# Patient Record
Sex: Female | Born: 1950 | Race: White | Hispanic: No | Marital: Single | Smoking: Former smoker
Health system: Southern US, Community
[De-identification: ages and names within clinical notes are randomized; demographics above are authoritative.]

## PROBLEM LIST (undated history)

## (undated) DIAGNOSIS — D126 Benign neoplasm of colon, unspecified: Secondary | ICD-10-CM

## (undated) DIAGNOSIS — T7840XA Allergy, unspecified, initial encounter: Secondary | ICD-10-CM

## (undated) DIAGNOSIS — M8580 Other specified disorders of bone density and structure, unspecified site: Secondary | ICD-10-CM

## (undated) DIAGNOSIS — C3490 Malignant neoplasm of unspecified part of unspecified bronchus or lung: Secondary | ICD-10-CM

## (undated) DIAGNOSIS — M549 Dorsalgia, unspecified: Secondary | ICD-10-CM

## (undated) HISTORY — DX: Allergy, unspecified, initial encounter: T78.40XA

## (undated) HISTORY — DX: Other specified disorders of bone density and structure, unspecified site: M85.80

## (undated) HISTORY — PX: COLONOSCOPY: SHX174

---

## 2007-06-17 ENCOUNTER — Other Ambulatory Visit: Admission: RE | Admit: 2007-06-17 | Discharge: 2007-06-17 | Payer: Self-pay | Admitting: Obstetrics and Gynecology

## 2008-02-05 ENCOUNTER — Ambulatory Visit (HOSPITAL_COMMUNITY): Admission: RE | Admit: 2008-02-05 | Discharge: 2008-02-05 | Payer: Self-pay | Admitting: Family Medicine

## 2008-02-19 ENCOUNTER — Ambulatory Visit (HOSPITAL_COMMUNITY): Admission: RE | Admit: 2008-02-19 | Discharge: 2008-02-19 | Payer: Self-pay | Admitting: General Surgery

## 2008-02-19 ENCOUNTER — Encounter (INDEPENDENT_AMBULATORY_CARE_PROVIDER_SITE_OTHER): Payer: Self-pay | Admitting: General Surgery

## 2011-01-08 NOTE — H&P (Signed)
[name], Haley [name]               ACCOUNT NO.:  [id number]   MEDICAL RECORD NO.:  [id number]          PATIENT TYPE:  AMB   LOCATION:  DAY                           FACILITY:  [facility]   PHYSICIAN:  Dalia Heading, M.D.  DATE OF BIRTH:  02/07/1951   DATE OF ADMISSION:  02/19/2008  DATE OF DISCHARGE:  06/26/2009LH                              HISTORY & PHYSICAL   CHIEF COMPLAINT:  Need for screening colonoscopy.   HISTORY OF PRESENT ILLNESS:  The patient is a 60 year old white female  who is referred for endoscopic evaluation.  She needs a colonoscopy for  screening purposes.  No abdominal pain, weight loss, nausea, vomiting,  diarrhea, constipation, melena, or hematochezia have been noted.  She  has never had a colonoscopy.  There is no family history of colon  carcinoma.   PAST MEDICAL HISTORY:  Unremarkable.   PAST SURGICAL HISTORY:  Unremarkable.   CURRENT MEDICATIONS:  None.   ALLERGIES:  No known drug allergies.   REVIEW OF SYSTEMS:  Noncontributory.   PHYSICAL EXAMINATION:  GENERAL:  The patient is a well-developed, well-  nourished white female, in no acute distress.  LUNGS:  Clear to auscultation with equal breath sounds bilaterally.  HEART:  Regular rate and rhythm without S3, S4, or murmurs.  ABDOMEN:  Soft, nontender, and nondistended.  No hepatosplenomegaly or  masses are noted.  RECTAL:  Deferred to the procedure.   IMPRESSION:  Need for screening colonoscopy.   PLAN:  The patient is scheduled for a colonoscopy on February 19, 2008.  The  risks and benefits of the procedure including bleeding and perforation  were fully explained to the patient, gave informed consent.      Dalia Heading, M.D.  Electronically Signed     MAJ/MEDQ  D:  02/02/2008  T:  02/03/2008  Job:  [id number]   cc:   Patrica Duel, M.D.  Fax: [phone]

## 2012-11-19 ENCOUNTER — Other Ambulatory Visit: Payer: Self-pay | Admitting: Family Medicine

## 2012-11-19 DIAGNOSIS — Z1231 Encounter for screening mammogram for malignant neoplasm of breast: Secondary | ICD-10-CM

## 2012-12-01 ENCOUNTER — Ambulatory Visit (HOSPITAL_COMMUNITY)
Admission: RE | Admit: 2012-12-01 | Discharge: 2012-12-01 | Disposition: A | Discharge status: Home / Self Care | Payer: BC Managed Care – PPO | Source: Ambulatory Visit | Attending: Family Medicine | Admitting: Family Medicine

## 2012-12-01 DIAGNOSIS — Z1231 Encounter for screening mammogram for malignant neoplasm of breast: Secondary | ICD-10-CM | POA: Insufficient documentation

## 2014-01-14 ENCOUNTER — Other Ambulatory Visit (HOSPITAL_COMMUNITY): Payer: Self-pay | Admitting: Nurse Practitioner

## 2014-01-14 DIAGNOSIS — Z1231 Encounter for screening mammogram for malignant neoplasm of breast: Secondary | ICD-10-CM

## 2014-01-24 ENCOUNTER — Ambulatory Visit (HOSPITAL_COMMUNITY)
Admission: RE | Admit: 2014-01-24 | Discharge: 2014-01-24 | Disposition: A | Discharge status: Home / Self Care | Payer: Managed Care, Other (non HMO) | Source: Ambulatory Visit | Attending: Nurse Practitioner | Admitting: Nurse Practitioner

## 2014-01-24 DIAGNOSIS — Z1231 Encounter for screening mammogram for malignant neoplasm of breast: Secondary | ICD-10-CM | POA: Insufficient documentation

## 2014-05-09 ENCOUNTER — Other Ambulatory Visit: Payer: Self-pay | Admitting: Gastroenterology

## 2014-07-05 ENCOUNTER — Other Ambulatory Visit: Payer: Self-pay | Admitting: Gastroenterology

## 2014-07-08 ENCOUNTER — Encounter (HOSPITAL_COMMUNITY): Payer: Self-pay | Admitting: *Deleted

## 2014-10-14 ENCOUNTER — Other Ambulatory Visit: Payer: Self-pay | Admitting: Gastroenterology

## 2014-10-20 ENCOUNTER — Encounter (HOSPITAL_COMMUNITY): Payer: Self-pay | Admitting: *Deleted

## 2014-10-25 ENCOUNTER — Ambulatory Visit (HOSPITAL_COMMUNITY): Payer: Managed Care, Other (non HMO) | Admitting: Anesthesiology

## 2014-10-25 ENCOUNTER — Encounter (HOSPITAL_COMMUNITY)
Admission: RE | Disposition: A | Discharge status: Home / Self Care | Payer: Self-pay | Source: Ambulatory Visit | Attending: Gastroenterology

## 2014-10-25 ENCOUNTER — Encounter (HOSPITAL_COMMUNITY): Payer: Self-pay

## 2014-10-25 ENCOUNTER — Ambulatory Visit (HOSPITAL_COMMUNITY)
Admission: RE | Admit: 2014-10-25 | Discharge: 2014-10-25 | Disposition: A | Discharge status: Home / Self Care | Payer: Managed Care, Other (non HMO) | Source: Ambulatory Visit | Attending: Gastroenterology | Admitting: Gastroenterology

## 2014-10-25 DIAGNOSIS — F17210 Nicotine dependence, cigarettes, uncomplicated: Secondary | ICD-10-CM | POA: Insufficient documentation

## 2014-10-25 DIAGNOSIS — D122 Benign neoplasm of ascending colon: Secondary | ICD-10-CM | POA: Diagnosis not present

## 2014-10-25 DIAGNOSIS — Z09 Encounter for follow-up examination after completed treatment for conditions other than malignant neoplasm: Secondary | ICD-10-CM | POA: Diagnosis present

## 2014-10-25 DIAGNOSIS — D123 Benign neoplasm of transverse colon: Secondary | ICD-10-CM | POA: Insufficient documentation

## 2014-10-25 HISTORY — PX: COLONOSCOPY WITH PROPOFOL: SHX5780

## 2014-10-25 SURGERY — COLONOSCOPY WITH PROPOFOL
Anesthesia: Monitor Anesthesia Care

## 2014-10-25 MED ORDER — SODIUM CHLORIDE 0.9 % IV SOLN: INTRAVENOUS | Status: DC

## 2014-10-25 MED ORDER — LACTATED RINGERS IV SOLN
INTRAVENOUS | Status: DC
  Administered 2014-10-25: 1000 mL via INTRAVENOUS

## 2014-10-25 MED ORDER — PROPOFOL 10 MG/ML IV BOLUS
INTRAVENOUS | Status: AC
  Filled 2014-10-25: qty 20

## 2014-10-25 MED ORDER — PROPOFOL INFUSION 10 MG/ML OPTIME
INTRAVENOUS | Status: DC | PRN
  Administered 2014-10-25: 200 ug/kg/min via INTRAVENOUS

## 2014-10-25 SURGICAL SUPPLY — 21 items

## 2014-10-25 NOTE — Anesthesia Postprocedure Evaluation (Signed)
  Anesthesia Post-op Note  Patient: Haley [name]  Procedure(s) Performed: Procedure(s): COLONOSCOPY WITH PROPOFOL (N/A)  Patient Location: PACU  Anesthesia Type:MAC  Level of Consciousness: awake, alert , oriented and patient cooperative  Airway and Oxygen Therapy: Patient Spontanous Breathing  Post-op Pain: none  Post-op Assessment: Post-op Vital signs reviewed, Patient's Cardiovascular Status Stable, Respiratory Function Stable, Patent Airway, No signs of Nausea or vomiting and Pain level controlled  Post-op Vital Signs: stable  Last Vitals:  Filed Vitals:   10/25/14 0850  BP: 148/90  Temp:   Resp: 22    Complications: No apparent anesthesia complications

## 2014-10-25 NOTE — Op Note (Signed)
Procedure: Surveillance colonoscopy. Colonoscopy with removal of a 7 mm adenomatous sigmoid colon polyp performed on 02/19/2008  Endoscopist: Earle Gell  Premedication: Propofol administered by anesthesia  Procedure: The patient was placed in the left lateral decubitus position. Anal inspection and digital rectal exam were normal. The Pentax pediatric colonoscope was introduced into the rectum and advanced to the cecum. A normal-appearing appendiceal orifice was identified. A normal-appearing ileocecal valve was identified. Colonic preparation for the exam today was satisfactory. Withdrawal time was 18 minutes  Rectum. Normal. Retroflexed view of the distal rectum normal  Sigmoid colon and descending colon. Normal  Splenic flexure. Normal  Transverse colon. From the mid-distal transverse colon, three 5 mm sessile polyps were removed with the cold snare  Hepatic flexure. Normal  Ascending colon. From the mid ascending colon, a 5 mm sessile polyp was removed with the cold snare  Cecum and ileocecal valve. Normal  Assessment: Three 5 mm sessile polyps were removed from the transverse colon and a 5 mm sessile polyp was removed from the ascending colon.  Recommendation: Schedule surveillance colonoscopy in 5 years

## 2014-10-25 NOTE — Transfer of Care (Signed)
Immediate Anesthesia Transfer of Care Note  Patient: Haley [name]  Procedure(s) Performed: Procedure(s): COLONOSCOPY WITH PROPOFOL (N/A)  Patient Location: PACU  Anesthesia Type:MAC  Level of Consciousness: sedated  Airway & Oxygen Therapy: Patient Spontanous Breathing and Patient connected to nasal cannula oxygen  Post-op Assessment: Report given to RN and Post -op Vital signs reviewed and stable  Post vital signs: Reviewed and stable  Last Vitals:  Filed Vitals:   10/25/14 0721  BP: 136/80  Temp: 36.6 C  Resp: 25    Complications: No apparent anesthesia complications

## 2014-10-25 NOTE — Anesthesia Preprocedure Evaluation (Signed)
Anesthesia Evaluation  Patient identified by MRN, date of birth, ID band Patient awake    Reviewed: Allergy & Precautions, NPO status , Patient's Chart, lab work & pertinent test results  Airway        Dental   Pulmonary Current Smoker,          Cardiovascular     Neuro/Psych    GI/Hepatic   Endo/Other    Renal/GU      Musculoskeletal   Abdominal   Peds  Hematology   Anesthesia Other Findings   Reproductive/Obstetrics                             Anesthesia Physical Anesthesia Plan  ASA: II  Anesthesia Plan: MAC   Post-op Pain Management:    Induction: Intravenous  Airway Management Planned: Simple Face Mask  Additional Equipment:   Intra-op Plan:   Post-operative Plan:   Informed Consent: I have reviewed the patients History and Physical, chart, labs and discussed the procedure including the risks, benefits and alternatives for the proposed anesthesia with the patient or authorized representative who has indicated his/her understanding and acceptance.     Plan Discussed with: CRNA, Anesthesiologist and Surgeon  Anesthesia Plan Comments:         Anesthesia Quick Evaluation

## 2014-10-25 NOTE — H&P (Signed)
  Procedure: Surveillance colonoscopy. Screening colonoscopy with removal of a 7 mm adenomatous sigmoid colon polyp performed on 02/22/2008  History: The patient is a 64 year old female born 07-10-1951. She is scheduled to undergo a surveillance colonoscopy today.  Medication allergies: None  Past medical and surgical history: No chronic medical problems or previous surgery.  Habits: The patient smokes one pack of cigarettes per day  Family history: Negative for colon cancer  Exam: The patient is alert and lying comfortably on the endoscopy stretcher. Abdomen is soft and nontender to palpation. Lungs are clear to auscultation. Cardiac exam reveals a regular rhythm.  Plan: Proceed with surveillance colonoscopy

## 2014-10-26 ENCOUNTER — Encounter (HOSPITAL_COMMUNITY): Payer: Self-pay | Admitting: Gastroenterology

## 2014-10-26 DIAGNOSIS — D126 Benign neoplasm of colon, unspecified: Secondary | ICD-10-CM

## 2014-10-26 HISTORY — DX: Benign neoplasm of colon, unspecified: D12.6

## 2015-02-15 ENCOUNTER — Other Ambulatory Visit (HOSPITAL_COMMUNITY): Payer: Self-pay | Admitting: Nurse Practitioner

## 2015-02-15 DIAGNOSIS — Z1231 Encounter for screening mammogram for malignant neoplasm of breast: Secondary | ICD-10-CM

## 2015-02-22 ENCOUNTER — Ambulatory Visit (HOSPITAL_COMMUNITY)
Admission: RE | Admit: 2015-02-22 | Discharge: 2015-02-22 | Disposition: A | Discharge status: Home / Self Care | Payer: Managed Care, Other (non HMO) | Source: Ambulatory Visit | Attending: Nurse Practitioner | Admitting: Nurse Practitioner

## 2015-02-22 DIAGNOSIS — Z1231 Encounter for screening mammogram for malignant neoplasm of breast: Secondary | ICD-10-CM | POA: Diagnosis not present

## 2015-05-18 ENCOUNTER — Encounter: Payer: Self-pay | Admitting: *Deleted

## 2015-07-03 ENCOUNTER — Ambulatory Visit (INDEPENDENT_AMBULATORY_CARE_PROVIDER_SITE_OTHER): Payer: Managed Care, Other (non HMO) | Admitting: Family Medicine

## 2015-07-03 ENCOUNTER — Encounter: Payer: Self-pay | Admitting: Family Medicine

## 2015-07-03 VITALS — BP 128/60 | HR 82 | Temp 97.3°F | Resp 16 | Ht 63.0 in | Wt 127.0 lb

## 2015-07-03 DIAGNOSIS — H6983 Other specified disorders of Eustachian tube, bilateral: Secondary | ICD-10-CM

## 2015-07-03 DIAGNOSIS — Z23 Encounter for immunization: Secondary | ICD-10-CM

## 2015-07-03 DIAGNOSIS — H6980 Other specified disorders of Eustachian tube, unspecified ear: Secondary | ICD-10-CM | POA: Insufficient documentation

## 2015-07-03 DIAGNOSIS — H9193 Unspecified hearing loss, bilateral: Secondary | ICD-10-CM

## 2015-07-03 DIAGNOSIS — E781 Pure hyperglyceridemia: Secondary | ICD-10-CM | POA: Diagnosis not present

## 2015-07-03 DIAGNOSIS — J309 Allergic rhinitis, unspecified: Secondary | ICD-10-CM | POA: Diagnosis not present

## 2015-07-03 DIAGNOSIS — E785 Hyperlipidemia, unspecified: Secondary | ICD-10-CM | POA: Insufficient documentation

## 2015-07-03 DIAGNOSIS — F17200 Nicotine dependence, unspecified, uncomplicated: Secondary | ICD-10-CM

## 2015-07-03 LAB — COMPREHENSIVE METABOLIC PANEL
ALBUMIN: 4.2 g/dL (ref 3.6–5.1)
ALT: 14 U/L (ref 6–29)
AST: 22 U/L (ref 10–35)
Alkaline Phosphatase: 70 U/L (ref 33–130)
BUN: 16 mg/dL (ref 7–25)
CHLORIDE: 101 mmol/L (ref 98–110)
CO2: 25 mmol/L (ref 20–31)
Calcium: 9.7 mg/dL (ref 8.6–10.4)
Creat: 0.75 mg/dL (ref 0.50–0.99)
Glucose, Bld: 87 mg/dL (ref 70–99)
POTASSIUM: 4.4 mmol/L (ref 3.5–5.3)
Sodium: 140 mmol/L (ref 135–146)
Total Bilirubin: 0.4 mg/dL (ref 0.2–1.2)
Total Protein: 7.1 g/dL (ref 6.1–8.1)

## 2015-07-03 LAB — LIPID PANEL
CHOLESTEROL: 259 mg/dL — AB (ref 125–200)
HDL: 66 mg/dL (ref >=46)
LDL Cholesterol: 173 mg/dL — ABNORMAL HIGH (ref <130)
TRIGLYCERIDES: 102 mg/dL (ref <150)
Total CHOL/HDL Ratio: 3.9 Ratio (ref <=5.0)
VLDL: 20 mg/dL (ref <30)

## 2015-07-03 LAB — HEPATITIS C ANTIBODY: HCV Ab: NEGATIVE

## 2015-07-03 MED ORDER — CETIRIZINE HCL 10 MG PO TABS: 10.0000 mg | ORAL_TABLET | Freq: Every day | ORAL | Status: DC

## 2015-07-03 MED ORDER — FLUTICASONE PROPIONATE 50 MCG/ACT NA SUSP: 2.0000 | Freq: Every day | NASAL | Status: DC

## 2015-07-03 NOTE — Assessment & Plan Note (Signed)
counsled on cessation, obtain CT of chest TDAP, Flu given, declines pneumonia vaccine, she will check into Shingles vaccine

## 2015-07-03 NOTE — Patient Instructions (Addendum)
Release of records- [facility]  Release of records- [facility]- need CT scan, Bone Density We will call with lab results  Flonase twice a day  Zyrtec '10mg'$  once a day  ENT referral  TDAP  We will call with lab results  F/U as needed or Physical end of July for Physical

## 2015-07-03 NOTE — Assessment & Plan Note (Signed)
I think some of hearing issues due to untreated allergies but does not explain all of it, refer to ENT for hearing exam

## 2015-07-03 NOTE — Assessment & Plan Note (Signed)
Restart flonase, add zyrtec

## 2015-07-03 NOTE — Assessment & Plan Note (Signed)
Recheck triglycerdies, discussed dietary changes,may need to add fish oil

## 2015-07-03 NOTE — Progress Notes (Signed)
Patient ID: Haley [name], female   DOB: 22-Jan-1951, 64 y.o.   MRN: [id number]   Subjective:    Patient ID: Haley [name], female    DOB: May 06, 1951, 64 y.o.   MRN: [id number]  Patient presents for [facility] and ENT Issues  For past 6 months or more she has had ear pressure,sore throat, problems with hearing. She used flonase at recommendation of previous PCP- [facility] this helped a few weeks, but hearing loss seems more than allergies. Denies reflux problems. She denies any stuffiness of ears, she has more ringing in ears an their are tones she can not hear.  CPE last done in July 2016, at that time told she has High TG. She had mammo which was normal, bone density as well. PAP due in 2017.  She is a smoker, has no interest in quitting at this time. She had CT of chest for smoking in 2015 does not know results of this. She does wheeze and cough at times.  She works at [employer], she does a lot of lifting. She injured right shoulder working out a few months ago, saw Dr. Tonita Cong at [place] had steroid injection. Still has pain but does not want to pursue any further      Review Of Systems:  GEN- denies fatigue, fever, weight loss,weakness, recent illness HEENT- denies eye drainage, change in vision,+ nasal discharge, CVS- denies chest pain, palpitations RESP- denies SOB, cough, wheeze ABD- denies N/V, change in stools, abd pain GU- denies dysuria, hematuria, dribbling, incontinence MSK- + joint pain, muscle aches, injury Neuro- denies headache, dizziness, syncope, seizure activity       Objective:    BP 128/60 mmHg  Pulse 82  Temp(Src) 97.3 F (36.3 C) (Oral)  Resp 16  Ht '5\' 3"'$  (1.6 m)  Wt 127 lb (57.607 kg)  BMI 22.50 kg/m2 GEN- NAD, alert and oriented x3 HEENT- PERRL, EOMI, non injected sclera, pink conjunctiva, MMM, oropharynx clear, TM clear bilat, nares clear rhinorrhea Neck- Supple, no thyromegaly CVS- RRR, no  murmur RESP-CTAB ABD-NABS,soft,NT,ND Psych- normal affect and mood EXT- No edema Pulses- Radial, DP- 2+        Assessment & Plan:      Problem List Items Addressed This Visit    Tobacco use disorder    counsled on cessation, obtain CT of chest TDAP, Flu given, declines pneumonia vaccine, she will check into Shingles vaccine      Hypertriglyceridemia    Recheck triglycerdies, discussed dietary changes,may need to add fish oil      Relevant Orders   Comprehensive metabolic panel   Lipid panel   Hepatitis C antibody, reflex   Eustachian tube dysfunction - Primary    I think some of hearing issues due to untreated allergies but does not explain all of it, refer to ENT for hearing exam      Allergic rhinitis    Restart flonase, add zyrtec       Other Visit Diagnoses    Need for prophylactic vaccination and inoculation against influenza        Relevant Orders    Flu Vaccine QUAD 36+ mos PF IM (Fluarix & Fluzone Quad PF) (Completed)    Need for prophylactic vaccination with combined diphtheria-tetanus-pertussis (DTP) vaccine        Relevant Orders    Tdap vaccine greater than or equal to 7yo IM (Completed)       Note: This dictation was prepared with Dragon dictation  along with smaller phrase technology. Any transcriptional errors that result from this process are unintentional.

## 2015-07-06 ENCOUNTER — Other Ambulatory Visit: Payer: Self-pay | Admitting: *Deleted

## 2015-07-06 MED ORDER — SIMVASTATIN 10 MG PO TABS: 10.0000 mg | ORAL_TABLET | Freq: Every day | ORAL | Status: DC

## 2016-02-12 ENCOUNTER — Other Ambulatory Visit: Payer: Self-pay | Admitting: Family Medicine

## 2016-02-12 DIAGNOSIS — Z1231 Encounter for screening mammogram for malignant neoplasm of breast: Secondary | ICD-10-CM

## 2016-03-04 ENCOUNTER — Ambulatory Visit (HOSPITAL_COMMUNITY)
Admission: RE | Admit: 2016-03-04 | Discharge: 2016-03-04 | Disposition: A | Discharge status: Home / Self Care | Payer: Managed Care, Other (non HMO) | Source: Ambulatory Visit | Attending: Family Medicine | Admitting: Family Medicine

## 2016-03-04 DIAGNOSIS — Z1231 Encounter for screening mammogram for malignant neoplasm of breast: Secondary | ICD-10-CM | POA: Diagnosis present

## 2016-03-04 LAB — HM DEXA SCAN

## 2016-03-25 ENCOUNTER — Encounter: Payer: Managed Care, Other (non HMO) | Admitting: Family Medicine

## 2016-03-28 ENCOUNTER — Encounter: Payer: Self-pay | Admitting: Family Medicine

## 2016-04-01 ENCOUNTER — Encounter: Payer: Self-pay | Admitting: Family Medicine

## 2016-04-02 ENCOUNTER — Telehealth: Payer: Self-pay | Admitting: *Deleted

## 2016-04-02 NOTE — Telephone Encounter (Signed)
Received Dexa results.   MD reviewed results and recommendations are as follows: Osteopenia noted Calcium '1200mg'$  PO QD Vitamin D 1000 IU PO QD Weight Bearing Exercises Repeat Dexa in (2) years  Call placed to patient and patient made aware.

## 2016-05-03 ENCOUNTER — Other Ambulatory Visit: Payer: Managed Care, Other (non HMO)

## 2016-05-03 DIAGNOSIS — Z79899 Other long term (current) drug therapy: Secondary | ICD-10-CM

## 2016-05-03 DIAGNOSIS — Z0000 Encounter for general adult medical examination without abnormal findings: Secondary | ICD-10-CM

## 2016-05-03 DIAGNOSIS — F17200 Nicotine dependence, unspecified, uncomplicated: Secondary | ICD-10-CM

## 2016-05-03 DIAGNOSIS — E781 Pure hyperglyceridemia: Secondary | ICD-10-CM

## 2016-05-03 LAB — CBC WITH DIFFERENTIAL/PLATELET
BASOS PCT: 1 %
Basophils Absolute: 55 cells/uL (ref 0–200)
EOS ABS: 110 {cells}/uL (ref 15–500)
Eosinophils Relative: 2 %
HEMATOCRIT: 42.1 % (ref 35.0–45.0)
HEMOGLOBIN: 14 g/dL (ref 12.0–15.0)
LYMPHS ABS: 2035 {cells}/uL (ref 850–3900)
Lymphocytes Relative: 37 %
MCH: 30.6 pg (ref 27.0–33.0)
MCHC: 33.3 g/dL (ref 32.0–36.0)
MCV: 91.9 fL (ref 80.0–100.0)
MONO ABS: 660 {cells}/uL (ref 200–950)
MPV: 9.1 fL (ref 7.5–12.5)
Monocytes Relative: 12 %
NEUTROS PCT: 48 %
Neutro Abs: 2640 cells/uL (ref 1500–7800)
Platelets: 280 10*3/uL (ref 140–400)
RBC: 4.58 MIL/uL (ref 3.80–5.10)
RDW: 13.3 % (ref 11.0–15.0)
WBC: 5.5 10*3/uL (ref 3.8–10.8)

## 2016-05-03 LAB — COMPLETE METABOLIC PANEL WITH GFR
ALT: 13 U/L (ref 6–29)
AST: 23 U/L (ref 10–35)
Albumin: 4.3 g/dL (ref 3.6–5.1)
Alkaline Phosphatase: 59 U/L (ref 33–130)
BUN: 15 mg/dL (ref 7–25)
CALCIUM: 9.8 mg/dL (ref 8.6–10.4)
CHLORIDE: 103 mmol/L (ref 98–110)
CO2: 27 mmol/L (ref 20–31)
Creat: 0.78 mg/dL (ref 0.50–0.99)
GFR, EST NON AFRICAN AMERICAN: 81 mL/min (ref >=60)
Glucose, Bld: 77 mg/dL (ref 70–99)
POTASSIUM: 4.7 mmol/L (ref 3.5–5.3)
Sodium: 138 mmol/L (ref 135–146)
Total Bilirubin: 0.4 mg/dL (ref 0.2–1.2)
Total Protein: 7 g/dL (ref 6.1–8.1)

## 2016-05-03 LAB — LIPID PANEL
CHOL/HDL RATIO: 4.1 ratio (ref <=5.0)
CHOLESTEROL: 252 mg/dL — AB (ref 125–200)
HDL: 62 mg/dL (ref >=46)
LDL Cholesterol: 168 mg/dL — ABNORMAL HIGH (ref <130)
Triglycerides: 108 mg/dL (ref <150)
VLDL: 22 mg/dL (ref <30)

## 2016-05-03 LAB — TSH: TSH: 3.05 m[IU]/L

## 2016-05-06 ENCOUNTER — Ambulatory Visit (INDEPENDENT_AMBULATORY_CARE_PROVIDER_SITE_OTHER): Payer: Managed Care, Other (non HMO) | Admitting: Family Medicine

## 2016-05-06 ENCOUNTER — Encounter: Payer: Self-pay | Admitting: Family Medicine

## 2016-05-06 VITALS — BP 128/62 | HR 84 | Temp 98.1°F | Resp 12 | Ht 63.0 in | Wt 125.0 lb

## 2016-05-06 DIAGNOSIS — M25559 Pain in unspecified hip: Secondary | ICD-10-CM | POA: Diagnosis not present

## 2016-05-06 DIAGNOSIS — M8580 Other specified disorders of bone density and structure, unspecified site: Secondary | ICD-10-CM

## 2016-05-06 DIAGNOSIS — F17200 Nicotine dependence, unspecified, uncomplicated: Secondary | ICD-10-CM | POA: Diagnosis not present

## 2016-05-06 DIAGNOSIS — Z23 Encounter for immunization: Secondary | ICD-10-CM | POA: Diagnosis not present

## 2016-05-06 DIAGNOSIS — Z0000 Encounter for general adult medical examination without abnormal findings: Secondary | ICD-10-CM

## 2016-05-06 DIAGNOSIS — Z124 Encounter for screening for malignant neoplasm of cervix: Secondary | ICD-10-CM

## 2016-05-06 NOTE — Progress Notes (Signed)
Subjective:    Patient ID: Haley [name], female    DOB: 1951-02-13, 65 y.o.   MRN: [id number]  Patient presents for CPE (with PAP- is fasting) Patient here for complete physical exam. She is due for Pap smear. She is no history of any abnormal Pap smear. Mammogram is up-to-date, colonoscopy up-to-date. Review of immunizations she is due for pneumonia vaccine as she is a smoker, shingles vaccine, flu shot Fasting lipid last reviewed at bedside her LDL and total cholesterol still a little elevated but no history of any coronary artery disease or equivalent except for smoking.  Her only concern is joint pain. She takes ibuprofen 600 mg almost daily to help with her shoulders her back and her hips. Her back and hips have worsened. She denies tingling,  numbness in the extremities. She denies a change in bowel or bladder   Bone density which showed osteopenia she's currently on calcium and vitamin D Review Of Systems:  GEN- denies fatigue, fever, weight loss,weakness, recent illness HEENT- denies eye drainage, change in vision, nasal discharge, CVS- denies chest pain, palpitations RESP- denies SOB, cough, wheeze ABD- denies N/V, change in stools, abd pain GU- denies dysuria, hematuria, dribbling, incontinence MSK- denies joint pain, muscle aches, injury Neuro- denies headache, dizziness, syncope, seizure activity       Objective:    BP 128/62 (BP Location: Left Arm, Patient Position: Sitting, Cuff Size: Normal)   Pulse 84   Temp 98.1 F (36.7 C) (Oral)   Resp 12   Ht '5\' 3"'$  (1.6 m)   Wt 125 lb (56.7 kg)   BMI 22.14 kg/m  GEN- NAD, alert and oriented x3 HEENT- PERRL, EOMI, non injected sclera, pink conjunctiva, MMM, oropharynx clear Neck- Supple, no thyromegaly CVS- RRR, no murmur RESP-CTAB Breast- normal symmetry, no nipple inversion,no nipple drainage, no nodules or lumps felt Nodes- no axillary nodes GU- normal external genitalia, vaginal  Atrophy , cervix visualized no  growth, no blood form os, minimal thin clear discharge, no CMT, no ovarian masses, uterus normal size Rectum- normal tone, FOBT neg  ABD-NABS,soft,NT,ND MSK- Spine NT, fair ROM, decreased ROM hips, neg SLR Neuro- normal tone LE, sensation grossly in tact  EXT- No edema Pulses- Radial, DP- 2+         Assessment & Plan:      Problem List Items Addressed This Visit    Tobacco use disorder - Primary    Counseled on tobacco cessation. Pneumonia vaccine 23 given      Osteopenia    Continue calcium and vitamin D and weightbearing exercise       Other Visit Diagnoses    Routine general medical examination at a health care facility       Physical done, flu shot given, so Pap smear done   Relevant Orders   PAP, ThinPrep ASCUS Rflx HPV Rflx Type   Hip pain, unspecified laterality       Obtain x-ray of lumbar spine and hip but thinks may have some degenerative changes. I would like to try her on Celebrex we will see what imaging looks like   Relevant Orders   DG Lumbar Spine 2-3 Views   DG HIPS BILAT WITH PELVIS 3-4 VIEWS   Cervical cancer screening       Relevant Orders   PAP, ThinPrep ASCUS Rflx HPV Rflx Type   Need for prophylactic vaccination against Streptococcus pneumoniae (pneumococcus)       Relevant Orders   Pneumococcal polysaccharide vaccine 23-valent greater  than or equal to 2yo subcutaneous/IM (Completed)   Need for prophylactic vaccination and inoculation against influenza       Relevant Orders   Flu Vaccine QUAD 36+ mos PF IM (Fluarix & Fluzone Quad PF) (Completed)      Note: This dictation was prepared with Dragon dictation along with smaller phrase technology. Any transcriptional errors that result from this process are unintentional.

## 2016-05-06 NOTE — Assessment & Plan Note (Signed)
Continue calcium and vitamin D and weightbearing exercise

## 2016-05-06 NOTE — Patient Instructions (Signed)
F/U as needed or in 1 year for physical I recommend eye visit once a year I recommend dental visit every 6 months Goal is to  Exercise 30 minutes 5 days a week We will send a letter with lab results

## 2016-05-06 NOTE — Assessment & Plan Note (Signed)
Counseled on tobacco cessation. Pneumonia vaccine 23 given

## 2016-05-08 LAB — PAP THINPREP ASCUS RFLX HPV RFLX TYPE

## 2016-05-20 ENCOUNTER — Ambulatory Visit (HOSPITAL_COMMUNITY)
Admission: RE | Admit: 2016-05-20 | Discharge: 2016-05-20 | Disposition: A | Discharge status: Home / Self Care | Payer: Managed Care, Other (non HMO) | Source: Ambulatory Visit | Attending: Family Medicine | Admitting: Family Medicine

## 2016-05-20 DIAGNOSIS — M5136 Other intervertebral disc degeneration, lumbar region: Secondary | ICD-10-CM | POA: Insufficient documentation

## 2016-05-20 DIAGNOSIS — M160 Bilateral primary osteoarthritis of hip: Secondary | ICD-10-CM | POA: Diagnosis not present

## 2016-05-20 DIAGNOSIS — M25559 Pain in unspecified hip: Secondary | ICD-10-CM | POA: Insufficient documentation

## 2016-05-23 ENCOUNTER — Other Ambulatory Visit: Payer: Self-pay | Admitting: *Deleted

## 2016-05-23 MED ORDER — CELECOXIB 100 MG PO CAPS: 100.0000 mg | ORAL_CAPSULE | Freq: Two times a day (BID) | ORAL | 1 refills | Status: DC

## 2017-04-16 ENCOUNTER — Other Ambulatory Visit: Payer: Self-pay | Admitting: Family Medicine

## 2017-04-16 DIAGNOSIS — Z1231 Encounter for screening mammogram for malignant neoplasm of breast: Secondary | ICD-10-CM

## 2017-04-23 ENCOUNTER — Ambulatory Visit (HOSPITAL_COMMUNITY): Payer: Managed Care, Other (non HMO)

## 2017-05-02 ENCOUNTER — Encounter: Payer: Self-pay | Admitting: Family Medicine

## 2017-05-12 ENCOUNTER — Encounter: Payer: Self-pay | Admitting: Family Medicine

## 2017-05-12 ENCOUNTER — Ambulatory Visit (INDEPENDENT_AMBULATORY_CARE_PROVIDER_SITE_OTHER): Payer: 59 | Admitting: Family Medicine

## 2017-05-12 ENCOUNTER — Ambulatory Visit (HOSPITAL_COMMUNITY)
Admission: RE | Admit: 2017-05-12 | Discharge: 2017-05-12 | Disposition: A | Discharge status: Home / Self Care | Payer: 59 | Source: Ambulatory Visit | Attending: Family Medicine | Admitting: Family Medicine

## 2017-05-12 ENCOUNTER — Ambulatory Visit (HOSPITAL_COMMUNITY): Payer: Managed Care, Other (non HMO)

## 2017-05-12 VITALS — BP 122/74 | HR 88 | Temp 98.1°F | Resp 14 | Ht 63.0 in | Wt 124.0 lb

## 2017-05-12 DIAGNOSIS — Z0000 Encounter for general adult medical examination without abnormal findings: Secondary | ICD-10-CM

## 2017-05-12 DIAGNOSIS — H6983 Other specified disorders of Eustachian tube, bilateral: Secondary | ICD-10-CM

## 2017-05-12 DIAGNOSIS — F17200 Nicotine dependence, unspecified, uncomplicated: Secondary | ICD-10-CM | POA: Diagnosis not present

## 2017-05-12 DIAGNOSIS — F39 Unspecified mood [affective] disorder: Secondary | ICD-10-CM

## 2017-05-12 DIAGNOSIS — M8589 Other specified disorders of bone density and structure, multiple sites: Secondary | ICD-10-CM | POA: Diagnosis not present

## 2017-05-12 DIAGNOSIS — E781 Pure hyperglyceridemia: Secondary | ICD-10-CM

## 2017-05-12 DIAGNOSIS — Z23 Encounter for immunization: Secondary | ICD-10-CM

## 2017-05-12 DIAGNOSIS — Z1231 Encounter for screening mammogram for malignant neoplasm of breast: Secondary | ICD-10-CM | POA: Diagnosis not present

## 2017-05-12 DIAGNOSIS — M1990 Unspecified osteoarthritis, unspecified site: Secondary | ICD-10-CM

## 2017-05-12 MED ORDER — CELECOXIB 100 MG PO CAPS: 100.0000 mg | ORAL_CAPSULE | Freq: Two times a day (BID) | ORAL | 3 refills | Status: DC

## 2017-05-12 NOTE — Assessment & Plan Note (Signed)
Diet controlled, recheck TG

## 2017-05-12 NOTE — Assessment & Plan Note (Signed)
Tinnitus with eustachian tube dysfunction. She was to hold off on ENT at this time. He is still taking her allergy medications which were recommended by them as well.

## 2017-05-12 NOTE — Assessment & Plan Note (Signed)
Will restart the celebrex

## 2017-05-12 NOTE — Assessment & Plan Note (Signed)
Continue with weightbearing activity increases much calcium and vitamin D in her diet. We'll check vitamin D level today.

## 2017-05-12 NOTE — Assessment & Plan Note (Signed)
Counseled on tobacco cessation

## 2017-05-12 NOTE — Assessment & Plan Note (Signed)
NO Red flags, she declines taking any prescribed meds  I reviewed the 5HTP, she is not on any other medications that should interfere but I did let her know I'm not sure the research truly behind it with regard to improving her mood. She is to start with 1 tablet a day consistently and see how she does. She missed that she does not like to take medications or supplements otherwise.  She will call if she wants to try something different for her mood is very any other changes.

## 2017-05-12 NOTE — Progress Notes (Signed)
Subjective:    Patient ID: Haley [name], female    DOB: [birth year], 66 y.o.   MRN: [id number]  Patient presents for CPE (is fasting)  Pt her for CPE and fasting labs  Bone Density - osteopenia in 2017, UTD, due in 2019,not taking the Vitamins Consistently , trying to get more through diet  Colonoscopy- UTD Mammogram- done in 2017, she has scheduledFor today Immunizations- due for flu shot, TDAP UTD, Pneumonia UTD   She was taking celebrex for arthritis which worked well she's out of the medication. She would like to restart. Zyrtec and flonase for allergies this has been a little worse since the change in weather with the store but she has not had any no fever, runny nose, sinus pain.  She's also noted that her mood has worsened over the past few months. States that she gets angry and snaps at people quickly. She does have some anxiety features she's always been a high strung person. She does not want to be on any prescribed medications but has been taken over-the-counter 5 HTP and she does occasionally use melatonin. She will to make sure this was okay for her to use. She denies any depression symptoms no suicidal ideations. She admits that there is stress with her job. She felt better when she had time to do yoga and some other activities but now with her job this has not been the case.  She continued to have ringing in her ears and occasional sore throat which is chronic. She saw ENT back in 2016 she wants to return as they did recommended a hearing exam and possible scope but she wants to wait until she has Medicare.   Family history reviewed         Review Of Systems:  GEN- denies fatigue, fever, weight loss,weakness, recent illness HEENT- denies eye drainage, change in vision, nasal discharge, CVS- denies chest pain, palpitations RESP- denies SOB, cough, wheeze ABD- denies N/V, change in stools, abd pain GU- denies dysuria, hematuria, dribbling, incontinence MSK- denies  joint pain, muscle aches, injury Neuro- denies headache, dizziness, syncope, seizure activity       Objective:    BP 122/74   Pulse 88   Temp 98.1 F (36.7 C) (Oral)   Resp 14   Ht 5\' 3"  (1.6 m)   Wt 124 lb (56.2 kg)   SpO2 98%   BMI 21.97 kg/m  GEN- NAD, alert and oriented x3 HEENT- PERRL, EOMI, non injected sclera, pink conjunctiva, MMM, oropharynx clear, TM clear bilat no effusion, hearing grossly in tact, no maxillary sinus tenderness  Neck- Supple, no thyromegaly, LAD CVS- RRR, no murmur RESP-CTAB ABD-NABS,soft,NT,ND Psych- normal affect and mood  EXT- No edema Pulses- Radial, DP- 2+        Assessment & Plan:      Problem List Items Addressed This Visit      Unprioritized   Tobacco use disorder    Counseled on tobacco cessation      Osteopenia    Continue with weightbearing activity increases much calcium and vitamin D in her diet. We'll check vitamin D level today.      Relevant Orders   VITAMIN D 25 Hydroxy (Vit-D Deficiency, Fractures)   Osteoarthritis    Will restart the celebrex      Relevant Medications   celecoxib (CELEBREX) 100 MG capsule   Mood disorder (HCC)    NO Red flags, she declines taking any prescribed meds  I reviewed the 5HTP, she  is not on any other medications that should interfere but I did let her know I'm not sure the research truly behind it with regard to improving her mood. She is to start with 1 tablet a day consistently and see how she does. She missed that she does not like to take medications or supplements otherwise.  She will call if she wants to try something different for her mood is very any other changes.      Hypertriglyceridemia    Diet controlled, recheck TG      Relevant Orders   Lipid panel   Eustachian tube dysfunction    Tinnitus with eustachian tube dysfunction. She was to hold off on ENT at this time. He is still taking her allergy medications which were recommended by them as well.       Other  Visit Diagnoses    Routine general medical examination at a health care facility    -  Primary   CPE done, Flu shot given, shingles vaccine to be ordered   Relevant Orders   CBC with Differential/Platelet   Comprehensive metabolic panel   Need for immunization against influenza       Relevant Orders   Flu Vaccine QUAD 36+ mos IM (Completed)      Note: This dictation was prepared with Dragon dictation along with smaller phrase technology. Any transcriptional errors that result from this process are unintentional.

## 2017-05-12 NOTE — Patient Instructions (Signed)
F/U 6 months  We will call with lab results Shingles vaccine to be ordered  Flu shot given

## 2017-05-13 LAB — CBC WITH DIFFERENTIAL/PLATELET
BASOS ABS: 57 {cells}/uL (ref 0–200)
Basophils Relative: 0.9 %
EOS PCT: 1.7 %
Eosinophils Absolute: 107 cells/uL (ref 15–500)
HCT: 44 % (ref 35.0–45.0)
Hemoglobin: 14.9 g/dL (ref 11.7–15.5)
LYMPHS ABS: 2589 {cells}/uL (ref 850–3900)
MCH: 30.8 pg (ref 27.0–33.0)
MCHC: 33.9 g/dL (ref 32.0–36.0)
MCV: 90.9 fL (ref 80.0–100.0)
MONOS PCT: 12 %
MPV: 9.2 fL (ref 7.5–12.5)
NEUTROS PCT: 44.3 %
Neutro Abs: 2791 cells/uL (ref 1500–7800)
PLATELETS: 344 10*3/uL (ref 140–400)
RBC: 4.84 10*6/uL (ref 3.80–5.10)
RDW: 12.2 % (ref 11.0–15.0)
TOTAL LYMPHOCYTE: 41.1 %
WBC mixed population: 756 cells/uL (ref 200–950)
WBC: 6.3 10*3/uL (ref 3.8–10.8)

## 2017-05-13 LAB — COMPREHENSIVE METABOLIC PANEL
AG Ratio: 1.8 (calc) (ref 1.0–2.5)
ALBUMIN MSPROF: 4.7 g/dL (ref 3.6–5.1)
ALT: 12 U/L (ref 6–29)
AST: 20 U/L (ref 10–35)
Alkaline phosphatase (APISO): 69 U/L (ref 33–130)
BILIRUBIN TOTAL: 0.4 mg/dL (ref 0.2–1.2)
BUN: 13 mg/dL (ref 7–25)
CALCIUM: 10.1 mg/dL (ref 8.6–10.4)
CO2: 26 mmol/L (ref 20–32)
CREATININE: 0.8 mg/dL (ref 0.50–0.99)
Chloride: 101 mmol/L (ref 98–110)
GLUCOSE: 85 mg/dL (ref 65–99)
Globulin: 2.6 g/dL (calc) (ref 1.9–3.7)
Potassium: 4.9 mmol/L (ref 3.5–5.3)
SODIUM: 135 mmol/L (ref 135–146)
TOTAL PROTEIN: 7.3 g/dL (ref 6.1–8.1)

## 2017-05-13 LAB — LIPID PANEL
CHOL/HDL RATIO: 4.1 (calc) (ref <5.0)
Cholesterol: 307 mg/dL — ABNORMAL HIGH (ref <200)
HDL: 75 mg/dL (ref >50)
LDL CHOLESTEROL (CALC): 203 mg/dL — AB
Non-HDL Cholesterol (Calc): 232 mg/dL (calc) — ABNORMAL HIGH (ref <130)
TRIGLYCERIDES: 140 mg/dL (ref <150)

## 2017-05-13 LAB — VITAMIN D 25 HYDROXY (VIT D DEFICIENCY, FRACTURES): VIT D 25 HYDROXY: 26 ng/mL — AB (ref 30–100)

## 2017-05-20 ENCOUNTER — Ambulatory Visit (INDEPENDENT_AMBULATORY_CARE_PROVIDER_SITE_OTHER): Payer: 59

## 2017-05-20 DIAGNOSIS — Z23 Encounter for immunization: Secondary | ICD-10-CM

## 2017-05-20 NOTE — Progress Notes (Signed)
Patient was seen in office for 1st dose of shingrix vaccine. Patient received vaccine in left deltoid

## 2017-05-24 ENCOUNTER — Emergency Department (HOSPITAL_COMMUNITY)
Admission: EM | Admit: 2017-05-24 | Discharge: 2017-05-24 | Disposition: A | Discharge status: Home / Self Care | Payer: 59 | Attending: Emergency Medicine | Admitting: Emergency Medicine

## 2017-05-24 ENCOUNTER — Emergency Department (HOSPITAL_COMMUNITY): Payer: 59

## 2017-05-24 ENCOUNTER — Encounter (HOSPITAL_COMMUNITY): Payer: Self-pay | Admitting: Emergency Medicine

## 2017-05-24 DIAGNOSIS — K529 Noninfective gastroenteritis and colitis, unspecified: Secondary | ICD-10-CM | POA: Diagnosis not present

## 2017-05-24 DIAGNOSIS — F17210 Nicotine dependence, cigarettes, uncomplicated: Secondary | ICD-10-CM | POA: Insufficient documentation

## 2017-05-24 DIAGNOSIS — F39 Unspecified mood [affective] disorder: Secondary | ICD-10-CM | POA: Insufficient documentation

## 2017-05-24 DIAGNOSIS — R109 Unspecified abdominal pain: Secondary | ICD-10-CM | POA: Diagnosis present

## 2017-05-24 DIAGNOSIS — Z79899 Other long term (current) drug therapy: Secondary | ICD-10-CM | POA: Diagnosis not present

## 2017-05-24 LAB — CBC
HEMATOCRIT: 41.9 % (ref 36.0–46.0)
HEMOGLOBIN: 14.1 g/dL (ref 12.0–15.0)
MCH: 30.5 pg (ref 26.0–34.0)
MCHC: 33.7 g/dL (ref 30.0–36.0)
MCV: 90.7 fL (ref 78.0–100.0)
Platelets: 298 10*3/uL (ref 150–400)
RBC: 4.62 MIL/uL (ref 3.87–5.11)
RDW: 12.6 % (ref 11.5–15.5)
WBC: 7.2 10*3/uL (ref 4.0–10.5)

## 2017-05-24 LAB — URINALYSIS, ROUTINE W REFLEX MICROSCOPIC
BILIRUBIN URINE: NEGATIVE
Bacteria, UA: NONE SEEN
GLUCOSE, UA: NEGATIVE mg/dL
Ketones, ur: NEGATIVE mg/dL
LEUKOCYTES UA: NEGATIVE
NITRITE: NEGATIVE
Protein, ur: NEGATIVE mg/dL
RBC / HPF: NONE SEEN RBC/hpf (ref 0–5)
Specific Gravity, Urine: 1.011 (ref 1.005–1.030)
Squamous Epithelial / LPF: NONE SEEN
WBC UA: NONE SEEN WBC/hpf (ref 0–5)
pH: 6 (ref 5.0–8.0)

## 2017-05-24 LAB — COMPREHENSIVE METABOLIC PANEL
ALT: 14 U/L (ref 14–54)
AST: 24 U/L (ref 15–41)
Albumin: 3.9 g/dL (ref 3.5–5.0)
Alkaline Phosphatase: 71 U/L (ref 38–126)
Anion gap: 10 (ref 5–15)
BILIRUBIN TOTAL: 0.7 mg/dL (ref 0.3–1.2)
BUN: 11 mg/dL (ref 6–20)
CO2: 22 mmol/L (ref 22–32)
CREATININE: 0.77 mg/dL (ref 0.44–1.00)
Calcium: 9.3 mg/dL (ref 8.9–10.3)
Chloride: 100 mmol/L — ABNORMAL LOW (ref 101–111)
GFR calc Af Amer: >60 mL/min (ref >60)
Glucose, Bld: 113 mg/dL — ABNORMAL HIGH (ref 65–99)
POTASSIUM: 4.5 mmol/L (ref 3.5–5.1)
Sodium: 132 mmol/L — ABNORMAL LOW (ref 135–145)
TOTAL PROTEIN: 6.6 g/dL (ref 6.5–8.1)

## 2017-05-24 LAB — LIPASE, BLOOD: Lipase: 41 U/L (ref 11–51)

## 2017-05-24 LAB — POC OCCULT BLOOD, ED: Fecal Occult Bld: POSITIVE — AB

## 2017-05-24 MED ORDER — ONDANSETRON HCL 4 MG/2ML IJ SOLN
4.0000 mg | Freq: Once | INTRAMUSCULAR | Status: AC
  Administered 2017-05-24: 4 mg via INTRAVENOUS
  Filled 2017-05-24: qty 2

## 2017-05-24 MED ORDER — MORPHINE SULFATE (PF) 4 MG/ML IV SOLN
4.0000 mg | Freq: Once | INTRAVENOUS | Status: AC
  Administered 2017-05-24: 4 mg via INTRAVENOUS
  Filled 2017-05-24: qty 1

## 2017-05-24 MED ORDER — SODIUM CHLORIDE 0.9 % IV BOLUS (SEPSIS)
1000.0000 mL | Freq: Once | INTRAVENOUS | Status: AC
  Administered 2017-05-24: 1000 mL via INTRAVENOUS

## 2017-05-24 MED ORDER — CIPROFLOXACIN HCL 500 MG PO TABS
500.0000 mg | ORAL_TABLET | Freq: Once | ORAL | Status: AC
  Administered 2017-05-24: 500 mg via ORAL
  Filled 2017-05-24: qty 1

## 2017-05-24 MED ORDER — ONDANSETRON 4 MG PO TBDP: ORAL_TABLET | ORAL | 0 refills | Status: DC

## 2017-05-24 MED ORDER — METRONIDAZOLE 500 MG PO TABS
500.0000 mg | ORAL_TABLET | Freq: Once | ORAL | Status: AC
  Administered 2017-05-24: 500 mg via ORAL
  Filled 2017-05-24: qty 1

## 2017-05-24 MED ORDER — CIPROFLOXACIN HCL 500 MG PO TABS: 500.0000 mg | ORAL_TABLET | Freq: Two times a day (BID) | ORAL | 0 refills | Status: DC

## 2017-05-24 MED ORDER — IOPAMIDOL (ISOVUE-300) INJECTION 61%
INTRAVENOUS | Status: AC
  Administered 2017-05-24: 100 mL
  Filled 2017-05-24: qty 100

## 2017-05-24 MED ORDER — METRONIDAZOLE 500 MG PO TABS: 500.0000 mg | ORAL_TABLET | Freq: Two times a day (BID) | ORAL | 0 refills | Status: DC

## 2017-05-24 NOTE — ED Provider Notes (Signed)
[facility] DEPT Provider Note   CSN: [id number] Arrival date & time: 05/24/17  4034     History   Chief Complaint Chief Complaint  Patient presents with  . Abdominal Pain    HPI Haley [name] is a 66 y.o. female history of osteopenia here presenting with abdominal pain, diarrhea, blood in the stool. Patient states that she's been having diarrhea intermittently for the last 4 days. She states that is watery and a resolved yesterday. Last night, she has sudden onset of watery diarrhea with blood in the stool as well. She denies any history of hemorrhoids. She denies any recent trauma or bad food. Denies any fevers or chills but does have some nausea but no vomiting. Patient denies any history of C. Difficile in the past or history of inflammatory bowel disease.  The history is provided by the patient.    Past Medical History:  Diagnosis Date  . Allergy   . Osteopenia     Patient Active Problem List   Diagnosis Date Noted  . Mood disorder ([place]) 05/12/2017  . Osteoarthritis 05/12/2017  . Osteopenia 05/06/2016  . Eustachian tube dysfunction 07/03/2015  . Allergic rhinitis 07/03/2015  . Tobacco use disorder 07/03/2015  . Hypertriglyceridemia 07/03/2015    Past Surgical History:  Procedure Laterality Date  . COLONOSCOPY    . COLONOSCOPY WITH PROPOFOL N/A 10/25/2014   Procedure: COLONOSCOPY WITH PROPOFOL;  Surgeon: Garlan Fair, MD;  Location: [facility];  Service: Endoscopy;  Laterality: N/A;    OB History    No data available       Home Medications    Prior to Admission medications   Medication Sig Start Date End Date Taking? Authorizing Provider  celecoxib (CELEBREX) 100 MG capsule Take 1 capsule (100 mg total) by mouth 2 (two) times daily. 05/12/17   Alycia Rossetti, MD  cetirizine (ZYRTEC) 10 MG tablet Take 1 tablet (10 mg total) by mouth daily. 07/03/15   , Modena Nunnery, MD  fluticasone [facility]) 50 MCG/ACT nasal spray Place 2 sprays into both  nostrils daily. 07/03/15   Alycia Rossetti, MD  Multiple Vitamin (MULTIVITAMIN WITH MINERALS) TABS tablet Take 1 tablet by mouth daily.    [provider]    Family History Family History  Problem Relation Age of Onset  . Diabetes Mother   . Hypertension Mother   . Diabetes Sister   . Hypertension Sister   . Hyperlipidemia Sister     Social History Social History  Substance Use Topics  . Smoking status: Current Every Day Smoker    Packs/day: 1.00    Years: 40.00    Types: Cigarettes  . Smokeless tobacco: Never Used  . Alcohol use Yes     Comment: rare social     Allergies   Patient has no known allergies.   Review of Systems Review of Systems  Gastrointestinal: Positive for abdominal pain.  All other systems reviewed and are negative.    Physical Exam Updated Vital Signs BP 104/67   Pulse 62   Temp (!) 97.4 F (36.3 C) (Oral)   Resp 17   SpO2 92%   Physical Exam  Constitutional: She is oriented to person, place, and time.  Slightly dehydrated, uncomfortable   HENT:  Head: Normocephalic.  MM slightly dry   Eyes: Pupils are equal, round, and reactive to light. Conjunctivae and EOM are normal.  Neck: Normal range of motion. Neck supple.  Cardiovascular: Normal rate, regular rhythm and normal heart sounds.  Pulmonary/Chest: Effort normal and breath sounds normal. No respiratory distress. She has no wheezes.  Abdominal: Soft.  Mild diffuse LLQ and RLQ tenderness, no rebound   Genitourinary:  Genitourinary Comments: Rectal- no obvious hemorroids. No stool at vault   Musculoskeletal: Normal range of motion.  Neurological: She is alert and oriented to person, place, and time. No cranial nerve deficit. Coordination normal.  Skin: Skin is warm.  Psychiatric: She has a normal mood and affect.  Nursing note and vitals reviewed.    ED Treatments / Results  Labs (all labs ordered are listed, but only abnormal results are displayed) Labs Reviewed    COMPREHENSIVE METABOLIC PANEL - Abnormal; Notable for the following:       Result Value   Sodium 132 (*)    Chloride 100 (*)    Glucose, Bld 113 (*)    All other components within normal limits  URINALYSIS, ROUTINE W REFLEX MICROSCOPIC - Abnormal; Notable for the following:    Hgb urine dipstick MODERATE (*)    All other components within normal limits  POC OCCULT BLOOD, ED - Abnormal; Notable for the following:    Fecal Occult Bld POSITIVE (*)    All other components within normal limits  LIPASE, BLOOD  CBC    EKG  EKG Interpretation None       Radiology Ct Abdomen Pelvis W Contrast  Result Date: 05/24/2017 CLINICAL DATA:  66 year old female with abdominal and pelvic pain with nausea for 2 days. EXAM: CT ABDOMEN AND PELVIS WITH CONTRAST TECHNIQUE: Multidetector CT imaging of the abdomen and pelvis was performed using the standard protocol following bolus administration of intravenous contrast. CONTRAST:  141mL ISOVUE-300 IOPAMIDOL (ISOVUE-300) INJECTION 61% COMPARISON:  None. FINDINGS: Lower chest: Mild bibasilar atelectasis noted. Hepatobiliary: The liver and gallbladder are unremarkable. No biliary dilatation. Pancreas: Unremarkable Spleen: Unremarkable Adrenals/Urinary Tract: The kidneys, bladder and adrenal glands are unremarkable. Stomach/Bowel: Circumferential wall thickening of the descending colon is noted with mild adjacent inflammation, compatible with colitis. No bowel obstruction, pneumoperitoneum or abscess. No other bowel wall thickening or inflammation identified. The appendix is normal. Vascular/Lymphatic: The visualized mesenteric vessels are patent. Aortic atherosclerosis. No enlarged abdominal or pelvic lymph nodes. Reproductive: Unremarkable Other: No ascites. Musculoskeletal: No acute or significant osseous findings. IMPRESSION: 1. Colitis involving the descending colon- likely infectious or inflammatory. No bowel obstruction, abscess or pneumoperitoneum. Visualized  mesenteric arteries appear patent. 2.  Aortic Atherosclerosis (ICD10-I70.0). Electronically Signed   By: Margarette Canada M.D.   On: 05/24/2017 13:00    Procedures Procedures (including critical care time)  Medications Ordered in ED Medications  ciprofloxacin (CIPRO) tablet 500 mg (not administered)  metroNIDAZOLE (FLAGYL) tablet 500 mg (not administered)  sodium chloride 0.9 % bolus 1,000 mL (1,000 mLs Intravenous New Bag/Given 05/24/17 1049)  ondansetron (ZOFRAN) injection 4 mg (4 mg Intravenous Given 05/24/17 1049)  morphine 4 MG/ML injection 4 mg (4 mg Intravenous Given 05/24/17 1049)  iopamidol (ISOVUE-300) 61 % injection (100 mLs  Contrast Given 05/24/17 1206)     Initial Impression / Assessment and Plan / ED Course  I have reviewed the triage vital signs and the nursing notes.  Pertinent labs & imaging results that were available during my care of the patient were reviewed by me and considered in my medical decision making (see chart for details).     Haley [name] is a 66 y.o. female here with abdominal pain, vomiting, diarrhea with blood. Likely colitis vs diverticulitis. No recent travel to suggest infection diarrhea,  no hx of C diff. Will get labs, occ, CT ab/pel.   2:05 PM Hg 14, baseline. Occ positive. CT showed mild colitis. Rectal bleeding likely from colitis. Will dc home with cipro, flagyl, zofran prn. Gave strict return precautions.    Final Clinical Impressions(s) / ED Diagnoses   Final diagnoses:  None    New Prescriptions New Prescriptions   No medications on file     Drenda Freeze, MD 05/24/17 1406

## 2017-05-24 NOTE — ED Notes (Signed)
Patient transported to CT 

## 2017-05-24 NOTE — Discharge Instructions (Signed)
Stay hydrated.   Take cipro and flagyl for your colitis.   Take zofran for nausea.   Expect bloody stools for 2-3 days until the antibiotics becomes effective.   See your doctor  Return to ER if you have uncontrolled bleeding, severe abdominal pain, fever, vomiting, dehydration

## 2017-05-24 NOTE — ED Triage Notes (Signed)
Patient is from home with two day history of abdominal pain, lower pelvic area and radiating around to back.  Patient denies any vomiting but does have some nausea.  Patient states that she has been having some diarrhea with the abdominal pain.  She states that she had some blood in her stool.

## 2017-05-27 ENCOUNTER — Other Ambulatory Visit: Payer: Self-pay | Admitting: *Deleted

## 2017-05-27 DIAGNOSIS — E781 Pure hyperglyceridemia: Secondary | ICD-10-CM

## 2017-05-27 MED ORDER — ATORVASTATIN CALCIUM 40 MG PO TABS: 40.0000 mg | ORAL_TABLET | Freq: Every day | ORAL | 1 refills | Status: DC

## 2017-06-27 ENCOUNTER — Ambulatory Visit (INDEPENDENT_AMBULATORY_CARE_PROVIDER_SITE_OTHER): Payer: 59 | Admitting: Family Medicine

## 2017-06-27 ENCOUNTER — Encounter: Payer: Self-pay | Admitting: Family Medicine

## 2017-06-27 VITALS — BP 126/70 | HR 82 | Temp 97.9°F | Resp 16 | Ht 63.0 in | Wt 128.0 lb

## 2017-06-27 DIAGNOSIS — J209 Acute bronchitis, unspecified: Secondary | ICD-10-CM

## 2017-06-27 DIAGNOSIS — F17200 Nicotine dependence, unspecified, uncomplicated: Secondary | ICD-10-CM | POA: Diagnosis not present

## 2017-06-27 MED ORDER — GUAIFENESIN-CODEINE 100-10 MG/5ML PO SOLN: 5.0000 mL | Freq: Four times a day (QID) | ORAL | 0 refills | Status: DC | PRN

## 2017-06-27 MED ORDER — AZITHROMYCIN 250 MG PO TABS: ORAL_TABLET | ORAL | 0 refills | Status: DC

## 2017-06-27 MED ORDER — PREDNISONE 20 MG PO TABS: 40.0000 mg | ORAL_TABLET | Freq: Every day | ORAL | 0 refills | Status: DC

## 2017-06-27 NOTE — Progress Notes (Signed)
   Subjective:    Patient ID: Haley [name], female    DOB: 03-28-1951, 66 y.o.   MRN: [id number]  Patient presents for Illness (x3 weeks- productive cough with white mucus, fatigue, ribs/ stomach pain from coughing)  Pt here with cough with production , fatigue for past 3 weeks .no fever, mild sinus drainage. + sick contacts, Cough keeps her up at night,weezing  Delsym  Note treated for colitis in ER that has resolved, had normal colonoscopy 2016  Stools back to baseline   Review Of Systems:  GEN- denies fatigue, fever, weight loss,weakness, recent illness HEENT- denies eye drainage, change in vision, nasal discharge, CVS- denies chest pain, palpitations RESP- denies SOB, +cough,+ wheeze ABD- denies N/V, change in stools, abd pain GU- denies dysuria, hematuria, dribbling, incontinence MSK- denies joint pain, muscle aches, injury Neuro- denies headache, dizziness, syncope, seizure activity       Objective:    BP 126/70   Pulse 82   Temp 97.9 F (36.6 C) (Oral)   Resp 16   Ht 5\' 3"  (1.6 m)   Wt 128 lb (58.1 kg)   SpO2 95%   BMI 22.67 kg/m  GEN- NAD, alert and oriented x3 HEENT- PERRL, EOMI, non injected sclera, pink conjunctiva, MMM, oropharynx clear , TM clear bilat no effusion,  +   Nasal drainage  Neck- Supple, no LAD CVS- RRR, no murmur RESP- few scattered wheeze, mild rhonchi, normal WOB  EXT- No edema Pulses- Radial 2+        Assessment & Plan:      Problem List Items Addressed This Visit      Unprioritized   Tobacco use disorder    Other Visit Diagnoses    Acute bronchitis, unspecified organism    -  Primary   Bronchitis in smoker, higher risk for PNA, treat zpak, prednisone, robitussin codiene. Delsym during day. Flonase for nasal drainage       Note: This dictation was prepared with Dragon dictation along with smaller phrase technology. Any transcriptional errors that result from this process are unintentional.

## 2017-06-27 NOTE — Patient Instructions (Signed)
Take antibiotics Take steroids Take cough medicine with codiene at bedtime Delsym during the day Use the flonase F/U as previous

## 2017-07-21 ENCOUNTER — Other Ambulatory Visit: Payer: 59

## 2017-07-21 ENCOUNTER — Ambulatory Visit (INDEPENDENT_AMBULATORY_CARE_PROVIDER_SITE_OTHER): Payer: 59 | Admitting: Family Medicine

## 2017-07-21 DIAGNOSIS — Z23 Encounter for immunization: Secondary | ICD-10-CM

## 2017-07-21 DIAGNOSIS — E781 Pure hyperglyceridemia: Secondary | ICD-10-CM

## 2017-07-21 LAB — LIPID PANEL
CHOL/HDL RATIO: 2.1 (calc) (ref <5.0)
CHOLESTEROL: 155 mg/dL (ref <200)
HDL: 73 mg/dL (ref >50)
LDL CHOLESTEROL (CALC): 66 mg/dL
NON-HDL CHOLESTEROL (CALC): 82 mg/dL (ref <130)
TRIGLYCERIDES: 77 mg/dL (ref <150)

## 2017-07-22 ENCOUNTER — Encounter: Payer: Self-pay | Admitting: *Deleted

## 2017-07-30 ENCOUNTER — Ambulatory Visit: Payer: 59 | Admitting: Physician Assistant

## 2017-07-30 ENCOUNTER — Encounter: Payer: Self-pay | Admitting: Physician Assistant

## 2017-07-30 ENCOUNTER — Other Ambulatory Visit: Payer: Self-pay

## 2017-07-30 VITALS — BP 108/78 | HR 86 | Temp 97.5°F | Resp 16 | Wt 126.0 lb

## 2017-07-30 DIAGNOSIS — J440 Chronic obstructive pulmonary disease with acute lower respiratory infection: Secondary | ICD-10-CM | POA: Diagnosis not present

## 2017-07-30 DIAGNOSIS — F17200 Nicotine dependence, unspecified, uncomplicated: Secondary | ICD-10-CM

## 2017-07-30 DIAGNOSIS — J209 Acute bronchitis, unspecified: Secondary | ICD-10-CM

## 2017-07-30 MED ORDER — PREDNISONE 20 MG PO TABS: ORAL_TABLET | ORAL | 0 refills | Status: DC

## 2017-07-30 MED ORDER — ALBUTEROL SULFATE HFA 108 (90 BASE) MCG/ACT IN AERS: 2.0000 | INHALATION_SPRAY | Freq: Four times a day (QID) | RESPIRATORY_TRACT | 2 refills | Status: DC | PRN

## 2017-07-30 MED ORDER — LEVOFLOXACIN 750 MG PO TABS: 750.0000 mg | ORAL_TABLET | Freq: Every day | ORAL | 0 refills | Status: AC

## 2017-07-30 NOTE — Progress Notes (Signed)
Patient ID: BRIGHTON DELIO MRN: [id number], DOB: Nov 04, 1950, 66 y.o. Date of Encounter: 07/30/2017, 12:38 PM    Chief Complaint:  Chief Complaint  Patient presents with  . Cough    x1 month      HPI: 66 y.o. year old female presents with above.   Reviewed her note from visit with Dr. Buelah Manis on 06/27/17.  At that time she presented with complaints of cough and sinus drainage.  Also wheezing. That visit she was diagnosed with acute bronchitis and was treated with azithromycin and prednisone.  Today she reports that she took those medications as directed and completed them.  However she states that the cough has continued.  Says that she has had continuous cough.  Says that she has had some runny nose and her throat has been a little sore and that she has been coughing up thick dark phlegm.  Some nights she is experiencing wheezing. She does smoke.     Home Meds:   Outpatient Medications Prior to Visit  Medication Sig Dispense Refill  . atorvastatin (LIPITOR) 40 MG tablet Take 1 tablet (40 mg total) by mouth daily. 90 tablet 1  . celecoxib (CELEBREX) 100 MG capsule Take 1 capsule (100 mg total) by mouth 2 (two) times daily. 180 capsule 3  . cetirizine (ZYRTEC) 10 MG tablet Take 1 tablet (10 mg total) by mouth daily. 30 tablet 3  . fluticasone (FLONASE) 50 MCG/ACT nasal spray Place 2 sprays into both nostrils daily. 16 g 3  . Multiple Vitamin (MULTIVITAMIN WITH MINERALS) TABS tablet Take 1 tablet by mouth daily.    Marland Kitchen azithromycin (ZITHROMAX) 250 MG tablet Take 2 tablets x 1 day, then 1 tab daily for 4 days 6 tablet 0  . guaiFENesin-codeine 100-10 MG/5ML syrup Take 5 mLs by mouth every 6 (six) hours as needed. 180 mL 0  . predniSONE (DELTASONE) 20 MG tablet Take 2 tablets (40 mg total) by mouth daily with breakfast. 10 tablet 0   No facility-administered medications prior to visit.     Allergies: No Known Allergies    Review of Systems: See HPI for pertinent ROS. All other  ROS negative.    Physical Exam: Blood pressure 108/78, pulse 86, temperature (!) 97.5 F (36.4 C), temperature source Oral, resp. rate 16, weight 57.2 kg (126 lb), SpO2 97 %., Body mass index is 22.32 kg/m. General: WNWD WF.  Appears in no acute distress. HEENT: Normocephalic, atraumatic, eyes without discharge, sclera non-icteric, nares are without discharge. Bilateral auditory canals clear, TM's are without perforation, pearly grey and translucent with reflective cone of light bilaterally. Oral cavity moist, posterior pharynx without exudate, erythema, peritonsillar abscess.  No tenderness with percussion to frontal or maxillary sinuses.  Neck: Supple. No thyromegaly. No lymphadenopathy. Lungs: She has mild wheezing scattered throughout bilaterally. Heart: Regular rhythm. No murmurs, rubs, or gallops. Msk:  Strength and tone normal for age. Extremities/Skin: Warm and dry.  Neuro: Alert and oriented X 3. Moves all extremities spontaneously. Gait is normal. CNII-XII grossly in tact. Psych:  Responds to questions appropriately with a normal affect.     ASSESSMENT AND PLAN:  66 y.o. year old female with  1. Bronchitis, chronic obstructive w acute bronchitis ([place]) She is to take the antibiotic and the prednisone as directed.  Also can use the albuterol as directed for any wheezing.  Told her that this time if symptoms are not resolving upon completion of antibiotic then go ahead and follow-up with [place] at that  time. - levofloxacin (LEVAQUIN) 750 MG tablet; Take 1 tablet (750 mg total) by mouth daily for 7 days.  Dispense: 7 tablet; Refill: 0 - predniSONE (DELTASONE) 20 MG tablet; Take 2 daily for 5 days.  Dispense: 10 tablet; Refill: 0 - albuterol (PROVENTIL HFA;VENTOLIN HFA) 108 (90 Base) MCG/ACT inhaler; Inhale 2 puffs into the lungs every 6 (six) hours as needed for wheezing or shortness of breath.  Dispense: 1 Inhaler; Refill: 2  2. Smoker She reports that she is trying to stop smoking.   She will further address this at her next routine visit with Dr. Buelah Manis.   Signed, [facility] [place], [place], [facility] 07/30/2017 12:38 PM

## 2017-08-13 ENCOUNTER — Ambulatory Visit: Payer: 59 | Admitting: Family Medicine

## 2017-08-21 ENCOUNTER — Other Ambulatory Visit: Payer: Self-pay | Admitting: *Deleted

## 2017-08-21 MED ORDER — ATORVASTATIN CALCIUM 40 MG PO TABS: 40.0000 mg | ORAL_TABLET | Freq: Every day | ORAL | 1 refills | Status: DC

## 2017-09-29 ENCOUNTER — Telehealth: Payer: Self-pay | Admitting: Hematology

## 2017-09-29 NOTE — Telephone Encounter (Signed)
Appt has been scheduled for the pt to see Dr. Irene Limbo on 2/5 at 1140am. Pt aware to arrive at 11:15am to be checked in on time. Location given to the pt.

## 2017-09-30 ENCOUNTER — Encounter: Payer: Self-pay | Admitting: Hematology

## 2017-09-30 ENCOUNTER — Inpatient Hospital Stay: Payer: No Typology Code available for payment source | Attending: Hematology | Admitting: Hematology

## 2017-09-30 ENCOUNTER — Ambulatory Visit: Payer: 59 | Admitting: Hematology

## 2017-09-30 ENCOUNTER — Telehealth: Payer: Self-pay | Admitting: Hematology

## 2017-09-30 VITALS — BP 117/75 | HR 88 | Temp 97.7°F | Resp 16 | Ht 63.0 in | Wt 121.9 lb

## 2017-09-30 DIAGNOSIS — R918 Other nonspecific abnormal finding of lung field: Secondary | ICD-10-CM | POA: Diagnosis not present

## 2017-09-30 DIAGNOSIS — M25551 Pain in right hip: Secondary | ICD-10-CM | POA: Diagnosis not present

## 2017-09-30 DIAGNOSIS — Z792 Long term (current) use of antibiotics: Secondary | ICD-10-CM

## 2017-09-30 DIAGNOSIS — C7970 Secondary malignant neoplasm of unspecified adrenal gland: Secondary | ICD-10-CM | POA: Insufficient documentation

## 2017-09-30 DIAGNOSIS — M8448XA Pathological fracture, other site, initial encounter for fracture: Secondary | ICD-10-CM

## 2017-09-30 DIAGNOSIS — M545 Low back pain: Secondary | ICD-10-CM

## 2017-09-30 DIAGNOSIS — M1990 Unspecified osteoarthritis, unspecified site: Secondary | ICD-10-CM | POA: Diagnosis not present

## 2017-09-30 DIAGNOSIS — E559 Vitamin D deficiency, unspecified: Secondary | ICD-10-CM

## 2017-09-30 DIAGNOSIS — R05 Cough: Secondary | ICD-10-CM

## 2017-09-30 DIAGNOSIS — G893 Neoplasm related pain (acute) (chronic): Secondary | ICD-10-CM | POA: Insufficient documentation

## 2017-09-30 DIAGNOSIS — F17210 Nicotine dependence, cigarettes, uncomplicated: Secondary | ICD-10-CM

## 2017-09-30 DIAGNOSIS — D7589 Other specified diseases of blood and blood-forming organs: Secondary | ICD-10-CM

## 2017-09-30 DIAGNOSIS — K831 Obstruction of bile duct: Secondary | ICD-10-CM | POA: Diagnosis not present

## 2017-09-30 DIAGNOSIS — M8580 Other specified disorders of bone density and structure, unspecified site: Secondary | ICD-10-CM

## 2017-09-30 DIAGNOSIS — C3490 Malignant neoplasm of unspecified part of unspecified bronchus or lung: Secondary | ICD-10-CM | POA: Diagnosis not present

## 2017-09-30 DIAGNOSIS — M25552 Pain in left hip: Secondary | ICD-10-CM | POA: Diagnosis not present

## 2017-09-30 DIAGNOSIS — I2510 Atherosclerotic heart disease of native coronary artery without angina pectoris: Secondary | ICD-10-CM | POA: Insufficient documentation

## 2017-09-30 DIAGNOSIS — C7951 Secondary malignant neoplasm of bone: Secondary | ICD-10-CM | POA: Diagnosis not present

## 2017-09-30 DIAGNOSIS — K5900 Constipation, unspecified: Secondary | ICD-10-CM | POA: Diagnosis not present

## 2017-09-30 DIAGNOSIS — E871 Hypo-osmolality and hyponatremia: Secondary | ICD-10-CM | POA: Diagnosis not present

## 2017-09-30 DIAGNOSIS — C787 Secondary malignant neoplasm of liver and intrahepatic bile duct: Secondary | ICD-10-CM | POA: Diagnosis not present

## 2017-09-30 DIAGNOSIS — M8448XS Pathological fracture, other site, sequela: Secondary | ICD-10-CM | POA: Insufficient documentation

## 2017-09-30 DIAGNOSIS — R188 Other ascites: Secondary | ICD-10-CM | POA: Insufficient documentation

## 2017-09-30 DIAGNOSIS — X58XXXA Exposure to other specified factors, initial encounter: Secondary | ICD-10-CM | POA: Diagnosis not present

## 2017-09-30 DIAGNOSIS — Z79899 Other long term (current) drug therapy: Secondary | ICD-10-CM

## 2017-09-30 DIAGNOSIS — C801 Malignant (primary) neoplasm, unspecified: Secondary | ICD-10-CM | POA: Diagnosis not present

## 2017-09-30 MED ORDER — OXYCODONE HCL 5 MG PO TABS: 5.0000 mg | ORAL_TABLET | ORAL | 0 refills | Status: DC | PRN

## 2017-09-30 MED ORDER — TIZANIDINE HCL 4 MG PO TABS: 4.0000 mg | ORAL_TABLET | Freq: Three times a day (TID) | ORAL | Status: DC | PRN

## 2017-09-30 MED ORDER — ACETAMINOPHEN 500 MG PO TABS: 1000.0000 mg | ORAL_TABLET | Freq: Two times a day (BID) | ORAL | 0 refills | Status: DC

## 2017-09-30 MED ORDER — SENNOSIDES-DOCUSATE SODIUM 8.6-50 MG PO TABS: 2.0000 | ORAL_TABLET | Freq: Every day | ORAL | 1 refills | Status: DC

## 2017-09-30 NOTE — Telephone Encounter (Signed)
Scheduled appt per 2/5 los - Gave patient AVS and calender per los. Central radiology to contact patient with ct scan . Pt aware they will need to contact them if they don't hear anything.

## 2017-09-30 NOTE — Patient Instructions (Signed)
Thank you for choosing [facility] to provide your oncology and hematology care.  To afford each patient quality time with our providers, please arrive 30 minutes before your scheduled appointment time.  If you arrive late for your appointment, you may be asked to reschedule.  We strive to give you quality time with our providers, and arriving late affects you and other patients whose appointments are after yours.   If you are a no show for multiple scheduled visits, you may be dismissed from the clinic at the providers discretion.    Again, thank you for choosing [facility], our hope is that these requests will decrease the amount of time that you wait before being seen by our physicians.  ______________________________________________________________________  Should you have questions after your visit to the [facility], please contact our office at [phone] between the hours of 8:30 and 4:30 p.m.    Voicemails left after 4:30p.m will not be returned until the following business day.    For prescription refill requests, please have your pharmacy contact us directly.  Please also try to allow 48 hours for prescription requests.    Please contact the scheduling department for questions regarding scheduling.  For scheduling of procedures such as PET scans, CT scans, MRI, Ultrasound, etc please contact central scheduling at [phone].    Resources For Cancer Patients and Caregivers:   Oncolink.org:  A wonderful resource for patients and healthcare providers for information regarding your disease, ways to tract your treatment, what to expect, etc.     American Cancer Society:  800-227-2345  Can help patients locate various types of support and financial assistance  Cancer Care: 1-800-813-HOPE (4673) Provides financial assistance, online support groups, medication/co-pay assistance.    [place] DSS:  336-641-3447 Where to apply for food  stamps, Medicaid, and utility assistance  Medicare Rights Center: 800-333-4114 Helps people with Medicare understand their rights and benefits, navigate the Medicare system, and secure the quality healthcare they deserve  SCAT: 336-333-6589 [place] [employer]'s shared-ride transportation service for eligible riders who have a disability that prevents them from riding the fixed route bus.    For additional information on assistance programs please contact our social worker:   Grier Hock/Abigail Elmore:  [phone]            

## 2017-09-30 NOTE — Telephone Encounter (Signed)
Lft the pt a vm to let her know appt has been moved to 320pm to day with Dr. Irene Limbo

## 2017-09-30 NOTE — Progress Notes (Signed)
HEMATOLOGY/ONCOLOGY CONSULTATION NOTE  Date of Service: 09/30/2017  Patient Care Team: Buelah Manis, Modena Nunnery, MD as PCP - General (Family Medicine)  CHIEF COMPLAINTS/PURPOSE OF CONSULTATION:  Bone Marrow Abnormality  Pathologic vertebral fracture  HISTORY OF PRESENTING ILLNESS:   Haley [name] is a wonderful 67 y.o. female who has been referred to [place] by Orthopedists Dr. Vickki Hearing for evaluation and management of Bone Marrow Abnormality. Her PCP is Dr. Buelah Manis.   She presents to the clinic today accompanied by her sons and her daughter-in-law. She notes she has several issues since October 2018.  She has colitis in 05/2017 with significant diarrhea and abdominal cramping --now mostly resolved.  She was treated with antibiotics. Afterward she had bronchitis. She was given albuterol and continues to take it as she has remaining cough (smoking contributed to this).   On 08/07/17 she notes she has back pain in her lower back the past couple of months. 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 Next day she saw her physician, who had done a xray and told her she had arthritis. She was treated with prednisone and gabapentin. After no back pain improvements on treatment, she has yet to return to work. She went to orthopedist and had a Lumbar MRI on 09/25/17. This shows a compression fracture of the L2 with abnormalities in her bone marrow.   She quit smoking 09/24/17. She had been smoking 1 pack a day for 50 years, since she was 67 yo. She still has cough from smoking but not as significant.   She denies heart conditions or disease, GI issues or diseases or other significant medical issues. DM runs in her family but no known cancers, blood disorders, or gene mutations. Prior to her health in the past 6 months she has lead a independent overall healthy life. Her last mammogram in 04/2017 was negative. Last colonoscopy in 2015 and last P[place]ear in 2017, were negative. She had a  bone density scan in 2009. She has never been on HR therapy.   Given her significant pain (up to 10/10). She currently takes hydrocodone 1 tablet q4-5hours. This only reduced her pain to 7-8/10. She takes Tizanidine 50m q5-6hours. She has a back brace that she does not use.   On review of symptoms, pt notes ongoing b/l hip pain, lower back pain which can radiate up the side and down her legs and buttocks. The pain can keep her up at night. She notes when she lays on her right side, her throat pain which causes her to cough. This started around time of bronchitis and has not improved. When she does cough she produces clear phlegm.  She denies significant weight loss, night sweats or chills, change in voice. Denies urinary or bowel issues since her colitis. Her water intake has been low and her urine out put is more yellow. Denies abnormal vaginal discharge.    MEDICAL HISTORY:  Past Medical History:  Diagnosis Date  . Allergy   . Osteopenia     SURGICAL HISTORY: Past Surgical History:  Procedure Laterality Date  . COLONOSCOPY    . COLONOSCOPY WITH PROPOFOL N/A 10/25/2014   Procedure: COLONOSCOPY WITH PROPOFOL;  Surgeon: MGarlan Fair MD;  Location: [facility];  Service: Endoscopy;  Laterality: N/A;    SOCIAL HISTORY: Social History   Socioeconomic History  . Marital status: Single    Spouse name: Not on file  . Number of children: Not on file  . Years  of education: Not on file  . Highest education level: Not on file  Social Needs  . Financial resource strain: Not on file  . Food insecurity - worry: Not on file  . Food insecurity - inability: Not on file  . Transportation needs - medical: Not on file  . Transportation needs - non-medical: Not on file  Occupational History  . Not on file  Tobacco Use  . Smoking status: Current Every Day Smoker    Packs/day: 1.00    Years: 40.00    Pack years: 40.00    Types: Cigarettes  . Smokeless tobacco: Never Used  Substance and  Sexual Activity  . Alcohol use: Yes    Comment: rare social  . Drug use: No  . Sexual activity: Not Currently  Other Topics Concern  . Not on file  Social History Narrative  . Not on file    FAMILY HISTORY: Family History  Problem Relation Age of Onset  . Diabetes Mother   . Hypertension Mother   . Diabetes Sister   . Hypertension Sister   . Hyperlipidemia Sister     ALLERGIES:  has No Known Allergies.  MEDICATIONS:  Current Outpatient Medications  Medication Sig Dispense Refill  . atorvastatin (LIPITOR) 40 MG tablet Take 1 tablet (40 mg total) by mouth daily. 90 tablet 1  . HYDROcodone-acetaminophen (NORCO/VICODIN) 5-325 MG tablet Take by mouth.    . Multiple Vitamin (MULTIVITAMIN WITH MINERALS) TABS tablet Take 1 tablet by mouth daily.    . ondansetron (ZOFRAN) 4 MG tablet Take 4 mg by mouth every 8 (eight) hours as needed.    Marland Kitchen tiZANidine (ZANAFLEX) 4 MG tablet Take 4 mg by mouth every 6 (six) hours as needed.    Marland Kitchen albuterol (PROVENTIL HFA;VENTOLIN HFA) 108 (90 Base) MCG/ACT inhaler Inhale 2 puffs into the lungs every 6 (six) hours as needed for wheezing or shortness of breath. (Patient not taking: Reported on 09/30/2017) 1 Inhaler 2  . celecoxib (CELEBREX) 100 MG capsule Take 1 capsule (100 mg total) by mouth 2 (two) times daily. (Patient not taking: Reported on 09/30/2017) 180 capsule 3   No current facility-administered medications for this visit.     REVIEW OF SYSTEMS:    10 Point review of Systems was done is negative except as noted above.  PHYSICAL EXAMINATION: ECOG PERFORMANCE STATUS: 2 - Symptomatic, <50% confined to bed  . Vitals:   09/30/17 1517  BP: 117/75  Pulse: 88  Resp: 16  Temp: 97.7 F (36.5 C)  SpO2: 98%   Filed Weights   09/30/17 1517  Weight: 121 lb 14.4 oz (55.3 kg)   .Body mass index is 21.59 kg/m.  GENERAL:alert, in no acute distress and comfortable SKIN: no acute rashes, no significant lesions EYES: conjunctiva are pink and  non-injected, sclera anicteric OROPHARYNX: MMM, no exudates, no oropharyngeal erythema or ulceration NECK: supple, no JVD LYMPH:  no palpable lymphadenopathy in the cervical, axillary or inguinal regions LUNGS: clear to auscultation b/l with normal respiratory effort HEART: regular rate & rhythm ABDOMEN:  normoactive bowel sounds , non tender, not distended. Extremity: no pedal edema PSYCH: alert & oriented x 3 with fluent speech NEURO: no focal motor/sensory deficits SKELETAL: Lumbar spine tenderness upon pressure  LABORATORY DATA:  I have reviewed the data as listed  . CBC Latest Ref Rng & Units 10/01/2017 05/24/2017  WBC 4.0 - 10.5 K/uL 5.9 7.2  Hemoglobin 12.0 - 15.0 g/dL 14.0 14.1  Hematocrit 36.0 - 46.0 % 39.3 41.9  Platelets 150 - 400 K/uL 202 298    . CMP Latest Ref Rng & Units 10/01/2017 05/24/2017  Glucose 65 - 99 mg/dL 135 113(H)  BUN 6 - 20 mg/dL 14 11  Creatinine 0.44 - 1.00 mg/dL 0.80 0.77  Sodium 135 - 145 mmol/L 126(L) 132(L)  Potassium 3.5 - 5.1 mmol/L 4.6 4.5  Chloride 101 - 111 mmol/L 93(L) 100(L)  CO2 22 - 32 mmol/L 23 22  Calcium 8.9 - 10.3 mg/dL 9.7 9.3  Total Protein 6.4 - 8.3 g/dL 7.0 6.6  Total Bilirubin 0.2 - 1.2 mg/dL 0.6 0.7  Alkaline Phos 40 - 150 U/L 107 71  AST 5 - 34 U/L 43(H) 24  ALT 0 - 55 U/L 16 14     RADIOGRAPHIC STUDIES: I have personally reviewed the radiological images as listed and agreed with the findings in the report. No results found.  MRI Lumbar Spine 09/25/17  IMPRESSION:  1. Extensive marrow signal abnormality throughout the osseous structures of the visualized thoracolumbar spine, sacrum, and iliac bones concerning for metastases, multiple myeloma, or other nonspecific marrow infiltrative disorder. Suggest obtaining a PET/CT to evaluate extent of disease/evaluate for a primary tumor.  2. L2 compression fracture, concerning for pathologic fracture given the extensive marrow abnormality with approximately 40% vertebral body  height loss and 8.54m of retropulsion resulting in moderate narrowing of the spinal canal. A postcontrast MRI with fat saturation would help to evaluate the enhancements pattern of the bony disease and evaluate for potential abnormal soft issue extending beyond the cortex which is not definitively evident in this exam.  3. Moderate facet arthrophy with grade 1 anterolisthesis at L4-5.  Degenerative disc disease with moderate left foraminal stenosis at L5-S1.     ASSESSMENT & PLAN:   SCHELSYE SUHREis a 67y.o. caucasian female with    1. L2 Compression Fracture - appears pathological on MRI  Concern for myeloma vs metastatic malignancy vs lymphoma Less likely benign compression fracture No focal symptoms suggestive of primary at this time. Given strong smoking hx - lung primary would be on the differential among other possibilities.  2. Abnormal Bone marrow signal on MRI Lspine ? Infiltrative disorder  3. HYponatremia - poor po intake vs SIADH (related to pain/lung involvement) -recommended patient consume more salty foods -will need to evaluate further based on other w/u findings -counseled patient to seek immediate help if AMS or headaches  PLAN:  -I discussed her Lumbar MRI which shows a compression fracture of the L2 and abnormality in her bone morrow.  -There is more work up needed to have definitive pathology or cause. Will do a blood test for markers, whole body PET scan and a possible bone marrow biopsy if prior workup indeterminate.  -Will discuss case with her orthopedist Dr. KDelilah Shan If L2 compression fracture unstable, it may need to be surgically managed or treated by IR -Will send IR referral to evaluate for bone lesion biopsy with RF ablation and vertebroplasty. -patient has been seen by IR Dr MLaurence Ferrariand MRI L spine with contrast has been ordered for 2/11 with tentative procedure planned for 2/12 -PET/CT ASAP to determine extent of bone involvement and evaluate for  primary source of metastases -labs - myeloma panel SFLC, LDH  4. Back pain related to pathologic L2 fracture -For current back pain management, I advised her to hold gabapentin and hydrocodone, -reduce Tizanidine 471mto q8hours prn and she can take OTC Tylenol 100048m12hours.  -I will prescribe Oxycodone to take  as needed for her pain. -She will start using her back brace.    4. Smoking history, productive cough  Smoked for 50 years, 1 pack a day  Has not had a Chest Xray or scan since 2008 She currently has moderate cough with clear phlegm ever since episode of bronchitis. She takes uses albuterol inhaler as needed.  Plan -counseled on absolute smoking cessation -will awaiting PET/CT ? Etiology for persistent cough   Prescribe Oxycodone to take prn  Labs tomorrow PET/CT in 5 days CT bone marrow biopsy IR referral for consideration bone lesion biopsy/RF ablation and vertebroplasty of L2 RTC with Dr Irene Limbo in 2weeks with PET/CT and bone marrow results   All of the patients questions were answered with apparent satisfaction. The patient knows to call the clinic with any problems, questions or concerns.  I spent 60 minutes counseling the patient face to face. The total time spent in the appointment was 80 minutes and more than 50% was on counseling and direct patient cares.    Sullivan Lone MD MS AAHIVMS [facility] Hematology/Oncology Physician [facility]  (Office):       [phone] (Work cell):  [phone] (Fax):           [phone]  09/30/2017 4:21 PM  This document serves as a record of services personally performed by Sullivan Lone, MD. It was created on his behalf by Joslyn Devon, a trained medical scribe. The creation of this record is based on the scribe's personal observations and the provider's statements to them.    .I have reviewed the above documentation for accuracy and completeness, and I agree with the above. Brunetta Genera MD MS

## 2017-10-01 ENCOUNTER — Telehealth: Payer: Self-pay | Admitting: Hematology

## 2017-10-01 ENCOUNTER — Ambulatory Visit
Admission: RE | Admit: 2017-10-01 | Discharge: 2017-10-01 | Disposition: A | Discharge status: Home / Self Care | Payer: Self-pay | Source: Ambulatory Visit | Attending: Hematology | Admitting: Hematology

## 2017-10-01 ENCOUNTER — Other Ambulatory Visit: Payer: Self-pay | Admitting: Hematology

## 2017-10-01 ENCOUNTER — Inpatient Hospital Stay: Payer: No Typology Code available for payment source

## 2017-10-01 DIAGNOSIS — E559 Vitamin D deficiency, unspecified: Secondary | ICD-10-CM

## 2017-10-01 DIAGNOSIS — C801 Malignant (primary) neoplasm, unspecified: Secondary | ICD-10-CM

## 2017-10-01 DIAGNOSIS — M8448XA Pathological fracture, other site, initial encounter for fracture: Secondary | ICD-10-CM

## 2017-10-01 DIAGNOSIS — C3490 Malignant neoplasm of unspecified part of unspecified bronchus or lung: Secondary | ICD-10-CM | POA: Diagnosis not present

## 2017-10-01 DIAGNOSIS — C7951 Secondary malignant neoplasm of bone: Secondary | ICD-10-CM

## 2017-10-01 LAB — CBC WITH DIFFERENTIAL/PLATELET
BASOS PCT: 0 %
Basophils Absolute: 0 10*3/uL (ref 0.0–0.1)
EOS ABS: 0 10*3/uL (ref 0.0–0.5)
Eosinophils Relative: 1 %
HCT: 39.3 % (ref 34.8–46.6)
Hemoglobin: 14 g/dL (ref 11.6–15.9)
Lymphocytes Relative: 23 %
Lymphs Abs: 1.3 10*3/uL (ref 0.9–3.3)
MCH: 30.7 pg (ref 25.1–34.0)
MCHC: 35.6 g/dL (ref 31.5–36.0)
MCV: 86.2 fL (ref 79.5–101.0)
Monocytes Absolute: 0.7 10*3/uL (ref 0.1–0.9)
Monocytes Relative: 12 %
NEUTROS PCT: 64 %
Neutro Abs: 3.8 10*3/uL (ref 1.5–6.5)
Platelets: 202 10*3/uL (ref 145–400)
RBC: 4.56 MIL/uL (ref 3.70–5.45)
RDW: 12.5 % (ref 11.2–14.5)
WBC: 5.9 10*3/uL (ref 3.9–10.3)

## 2017-10-01 LAB — CMP (CANCER CENTER ONLY)
ALK PHOS: 107 U/L (ref 40–150)
ALT: 16 U/L (ref 0–55)
AST: 43 U/L — ABNORMAL HIGH (ref 5–34)
Albumin: 3.8 g/dL (ref 3.5–5.0)
Anion gap: 10 (ref 3–11)
BILIRUBIN TOTAL: 0.6 mg/dL (ref 0.2–1.2)
BUN: 14 mg/dL (ref 7–26)
CALCIUM: 9.7 mg/dL (ref 8.4–10.4)
CO2: 23 mmol/L (ref 22–29)
CREATININE: 0.8 mg/dL (ref 0.60–1.10)
Chloride: 93 mmol/L — ABNORMAL LOW (ref 98–109)
Glucose, Bld: 135 mg/dL (ref 70–140)
Potassium: 4.6 mmol/L (ref 3.5–5.1)
Sodium: 126 mmol/L — ABNORMAL LOW (ref 136–145)
Total Protein: 7 g/dL (ref 6.4–8.3)

## 2017-10-01 LAB — C-REACTIVE PROTEIN: CRP: <0.8 mg/dL (ref <1.0)

## 2017-10-01 LAB — LACTATE DEHYDROGENASE: LDH: 418 U/L — AB (ref 125–245)

## 2017-10-01 LAB — RETICULOCYTES
RBC.: 4.56 MIL/uL (ref 3.70–5.45)
RETIC CT PCT: 0.9 % (ref 0.7–2.1)
Retic Count, Absolute: 41 10*3/uL (ref 33.7–90.7)

## 2017-10-01 LAB — SEDIMENTATION RATE: Sed Rate: 15 mm/hr (ref 0–22)

## 2017-10-01 NOTE — Telephone Encounter (Signed)
Returned call to patient from voicemail she left about appointment with Dr Irene Limbo.  Patient was confused as to why she received a call about appointment tomorrow with Dr Irene Limbo.  Her appointment is not until 2/18

## 2017-10-02 ENCOUNTER — Other Ambulatory Visit (HOSPITAL_COMMUNITY): Payer: Self-pay | Admitting: Interventional Radiology

## 2017-10-02 ENCOUNTER — Encounter: Payer: Self-pay | Admitting: *Deleted

## 2017-10-02 ENCOUNTER — Ambulatory Visit
Admission: RE | Admit: 2017-10-02 | Discharge: 2017-10-02 | Disposition: A | Discharge status: Home / Self Care | Payer: 59 | Source: Ambulatory Visit | Attending: Hematology | Admitting: Hematology

## 2017-10-02 ENCOUNTER — Telehealth: Payer: Self-pay | Admitting: *Deleted

## 2017-10-02 DIAGNOSIS — M8448XA Pathological fracture, other site, initial encounter for fracture: Secondary | ICD-10-CM

## 2017-10-02 DIAGNOSIS — C7951 Secondary malignant neoplasm of bone: Secondary | ICD-10-CM

## 2017-10-02 DIAGNOSIS — C801 Malignant (primary) neoplasm, unspecified: Secondary | ICD-10-CM

## 2017-10-02 HISTORY — PX: IR RADIOLOGIST EVAL & MGMT: IMG5224

## 2017-10-02 LAB — KAPPA/LAMBDA LIGHT CHAINS
KAPPA FREE LGHT CHN: 13.6 mg/L (ref 3.3–19.4)
KAPPA, LAMDA LIGHT CHAIN RATIO: 1.03 (ref 0.26–1.65)
LAMDA FREE LIGHT CHAINS: 13.2 mg/L (ref 5.7–26.3)

## 2017-10-02 LAB — VITAMIN D 25 HYDROXY (VIT D DEFICIENCY, FRACTURES): VIT D 25 HYDROXY: 30.1 ng/mL (ref 30.0–100.0)

## 2017-10-02 NOTE — Telephone Encounter (Addendum)
FYI  "Calling to confirm receipt of facsimile sent yesterday.  New request for short term disability.  Forms faxed yesterday to [phone]."  New fax number in use for this provider.  New fax number [phone]) provided at this time to resend documents for Dr. Grier Mitts receipt.

## 2017-10-02 NOTE — Consult Note (Signed)
Chief Complaint: Patient was seen in consultation today for  Chief Complaint  Patient presents with  . Advice Only    Consult for L2 Compression Fracture/Biopsy of Lesion   at the request of Brunetta Genera  Referring Physician(s): Brunetta Genera  History of Present Illness: Haley [name] is a 67 y.o. female who was in her usual state of health until 08/07/2017 when she experienced acute onset of severe lower back pain while lifting a heavy object at work. She was initially treated conservatively for arthritis/degenerative disc disease. Due to persistent pain, she underwent further evaluation and an MRI scan identified a subacute approximate 40% compression fracture of the L2 vertebral body with 8 mm of posterior retropulsion. The MRI also demonstrated diffuse marrow signal abnormality concerning for possible metastatic disease or multiple myeloma. She was then referred to Dr. Irene Limbo for further evaluation and management.  She is currently wearing a TLSO brace and taking hydrocodone twice daily for her pain. Despite these conservative measures, her back pain is consistently around a 5 on a scale of 1-10 with spikes up to 10 out of 10 when changing position from supine to sitting or standing, and when attempting to bend over for everyday activities like putting on her socks and shoes. Her pain is also affecting her ability to sleep. Her pain does radiate into both hips, however she notes that she did have hip pain prior to the fracture and this may be a separate issue.  She denies urinary or fecal incontinence. She admits to some constipation since beginning daily use of narcotic pain medicines. She has no significant radiculopathy.  PET CT is scheduled for next Friday 10/10/17.   Past Medical History:  Diagnosis Date  . Allergy   . Osteopenia     Past Surgical History:  Procedure Laterality Date  . COLONOSCOPY    . COLONOSCOPY WITH PROPOFOL N/A 10/25/2014   Procedure:  COLONOSCOPY WITH PROPOFOL;  Surgeon: Garlan Fair, MD;  Location: [facility];  Service: Endoscopy;  Laterality: N/A;  . IR RADIOLOGIST EVAL & MGMT  10/02/2017    Allergies: Patient has no known allergies.  Medications: Prior to Admission medications   Medication Sig Start Date End Date Taking? Authorizing Provider  acetaminophen (TYLENOL) 500 MG tablet Take 2 tablets (1,000 mg total) by mouth 2 (two) times daily. 09/30/17  Yes Brunetta Genera, MD  atorvastatin (LIPITOR) 40 MG tablet Take 1 tablet (40 mg total) by mouth daily. 08/21/17  Yes [place], Modena Nunnery, MD  Multiple Vitamin (MULTIVITAMIN WITH MINERALS) TABS tablet Take 1 tablet by mouth daily.   Yes [provider]  ondansetron (ZOFRAN) 4 MG tablet Take 4 mg by mouth every 8 (eight) hours as needed.   Yes [provider]  oxyCODONE (OXY IR/ROXICODONE) 5 MG immediate release tablet Take 1-2 tablets (5-10 mg total) by mouth every 4 (four) hours as needed for severe pain. 09/30/17  Yes Brunetta Genera, MD  tiZANidine (ZANAFLEX) 4 MG tablet Take 1 tablet (4 mg total) by mouth every 8 (eight) hours as needed. 09/30/17  Yes Brunetta Genera, MD  albuterol (PROVENTIL HFA;VENTOLIN HFA) 108 (90 Base) MCG/ACT inhaler Inhale 2 puffs into the lungs every 6 (six) hours as needed for wheezing or shortness of breath. Patient not taking: Reported on 10/02/2017 07/30/17   Dena Billet B, PA-C  senna-docusate (SENNA S) 8.6-50 MG tablet Take 2 tablets by mouth at bedtime. Patient not taking: Reported on 10/02/2017 09/30/17  Brunetta Genera, MD     Family History  Problem Relation Age of Onset  . Diabetes Mother   . Hypertension Mother   . Diabetes Sister   . Hypertension Sister   . Hyperlipidemia Sister     Social History   Socioeconomic History  . Marital status: Single    Spouse name: Not on file  . Number of children: Not on file  . Years of education: Not on file  . Highest education level: Not on file  Social  Needs  . Financial resource strain: Not on file  . Food insecurity - worry: Not on file  . Food insecurity - inability: Not on file  . Transportation needs - medical: Not on file  . Transportation needs - non-medical: Not on file  Occupational History  . Not on file  Tobacco Use  . Smoking status: Current Every Day Smoker    Packs/day: 1.00    Years: 40.00    Pack years: 40.00    Types: Cigarettes  . Smokeless tobacco: Never Used  Substance and Sexual Activity  . Alcohol use: Yes    Comment: rare social  . Drug use: No  . Sexual activity: Not Currently  Other Topics Concern  . Not on file  Social History Narrative  . Not on file    ECOG Status: 2 - Symptomatic, <50% confined to bed  Review of Systems: A 12 point ROS discussed and pertinent positives are indicated in the HPI above.  All other systems are negative.  Review of Systems  Vital Signs: BP 102/69   Pulse 88   Temp 98.2 F (36.8 C) (Oral)   Resp 14   Ht 5' 3"  (1.6 m)   Wt 121 lb (54.9 kg)   SpO2 93%   BMI 21.43 kg/m   Physical Exam  Constitutional: She appears well-developed and well-nourished. No distress.  HENT:  Head: Normocephalic and atraumatic.  Eyes: No scleral icterus.  Cardiovascular: Normal rate and regular rhythm.  Pulmonary/Chest: Effort normal.  Abdominal: Soft. She exhibits no distension. There is no tenderness.  Musculoskeletal:       Back:  Focal significant TTP at the L2 spinous process  Nursing note and vitals reviewed.    Imaging: Ir Radiologist Eval & Mgmt  Result Date: 10/02/2017 Please refer to notes tab for details about interventional procedure. (Op Note)  Mr Outside Films Spine  Result Date: 10/01/2017 This examination belongs to an outside facility and is stored here for comparison purposes only.  Contact the originating outside institution for any associated report or interpretation.   Labs:  CBC: Recent Labs    05/12/17 1110 05/24/17 0650 10/01/17 1002    WBC 6.3 7.2 5.9  HGB 14.9 14.1 14.0  HCT 44.0 41.9 39.3  PLT 344 298 202    COAGS: No results for input(s): INR, APTT in the last 8760 hours.  BMP: Recent Labs    05/12/17 1110 05/24/17 0650 10/01/17 1002  NA 135 132* 126*  K 4.9 4.5 4.6  CL 101 100* 93*  CO2 26 22 23   GLUCOSE 85 113* 135  BUN 13 11 14   CALCIUM 10.1 9.3 9.7  CREATININE 0.80 0.77 0.80  GFRNONAA  --  >60 >60  GFRAA  --  >60 >60    LIVER FUNCTION TESTS: Recent Labs    05/12/17 1110 05/24/17 0650 10/01/17 1002  BILITOT 0.4 0.7 0.6  AST 20 24 43*  ALT 12 14 16   ALKPHOS  --  71  107  PROT 7.3 6.6 7.0  ALBUMIN  --  3.9 3.8    TUMOR MARKERS: No results for input(s): AFPTM, CEA, CA199, CHROMGRNA in the last 8760 hours.  Assessment and Plan:  Very pleasant 67 year old female with a subacute symptomatic L2 compression fracture which may be pathologic in nature. Her recent noncontrast enhanced MRI demonstrates diffuse marrow signal concerning for infiltrative malignancy.  Thus far, she continues to have significant pain which reduces her quality of life and inhibits her activities of daily living. She is a good candidate for per cutaneous cement augmentation. Additionally, biopsy and potentially radiofrequency ablation could also be performed concurrently.  She currently has a PET/CT scheduled to evaluate the extent of her possible metastatic disease as well as to evaluate for a primary lesion. I think that an MRI of the lumbar spine enhanced with gadolinium contrast would also be important to fully evaluate the extent of any pathologic process in the spine. This is important for ablation planning. She currently has a trip scheduled to [place] on February 20. She doesn't want to make any treatment decisions prior to returning.  1.) I will have my staff order an MRI of the lumbar spine with gadolinium contrast for procedural planning. 2.) She is to call my office when she returns from [place] to schedule  follow-up appointment at which time we will review the results of her PET scan, contrast enhanced lumbar spine MRI and discuss more definitive treatment options at that time.  Thank you for this interesting consult.  I greatly enjoyed meeting Haley [name] and look forward to participating in their care.  A copy of this report was sent to the requesting provider on this date.  Electronically Signed: Jacqulynn Cadet 10/02/2017, 4:08 PM   I spent a total of  40 Minutes in face to face in clinical consultation, greater than 50% of which was counseling/coordinating care for L2 fracture.

## 2017-10-03 ENCOUNTER — Telehealth: Payer: Self-pay | Admitting: *Deleted

## 2017-10-03 NOTE — Telephone Encounter (Signed)
Pt LVM stating she went to referral yesterday with Dr. Laurence Ferrari per Dr. Grier Mitts request.  Pt now has a few additional questions for Dr. Irene Limbo, would like call back from MD to discuss.  Message passed to Dr. Irene Limbo, MD stated he would call pt.

## 2017-10-05 ENCOUNTER — Emergency Department (HOSPITAL_COMMUNITY)
Admission: EM | Admit: 2017-10-05 | Discharge: 2017-10-06 | Disposition: A | Discharge status: Home / Self Care | Payer: No Typology Code available for payment source | Attending: Emergency Medicine | Admitting: Emergency Medicine

## 2017-10-05 ENCOUNTER — Emergency Department (HOSPITAL_COMMUNITY): Payer: No Typology Code available for payment source

## 2017-10-05 ENCOUNTER — Other Ambulatory Visit: Payer: Self-pay

## 2017-10-05 ENCOUNTER — Encounter (HOSPITAL_COMMUNITY): Payer: Self-pay

## 2017-10-05 DIAGNOSIS — R0789 Other chest pain: Secondary | ICD-10-CM | POA: Diagnosis present

## 2017-10-05 DIAGNOSIS — R0602 Shortness of breath: Secondary | ICD-10-CM | POA: Insufficient documentation

## 2017-10-05 DIAGNOSIS — R05 Cough: Secondary | ICD-10-CM | POA: Insufficient documentation

## 2017-10-05 DIAGNOSIS — Z79899 Other long term (current) drug therapy: Secondary | ICD-10-CM | POA: Insufficient documentation

## 2017-10-05 DIAGNOSIS — J9811 Atelectasis: Secondary | ICD-10-CM | POA: Diagnosis not present

## 2017-10-05 DIAGNOSIS — M545 Low back pain: Secondary | ICD-10-CM | POA: Insufficient documentation

## 2017-10-05 DIAGNOSIS — R918 Other nonspecific abnormal finding of lung field: Secondary | ICD-10-CM | POA: Diagnosis not present

## 2017-10-05 DIAGNOSIS — F17210 Nicotine dependence, cigarettes, uncomplicated: Secondary | ICD-10-CM | POA: Insufficient documentation

## 2017-10-05 LAB — CBC
HEMATOCRIT: 39.4 % (ref 36.0–46.0)
HEMOGLOBIN: 14.3 g/dL (ref 12.0–15.0)
MCH: 31.2 pg (ref 26.0–34.0)
MCHC: 36.3 g/dL — ABNORMAL HIGH (ref 30.0–36.0)
MCV: 85.8 fL (ref 78.0–100.0)
Platelets: 248 10*3/uL (ref 150–400)
RBC: 4.59 MIL/uL (ref 3.87–5.11)
RDW: 12.6 % (ref 11.5–15.5)
WBC: 7.1 10*3/uL (ref 4.0–10.5)

## 2017-10-05 LAB — I-STAT TROPONIN, ED: Troponin i, poc: 0 ng/mL (ref 0.00–0.08)

## 2017-10-05 LAB — BASIC METABOLIC PANEL
Anion gap: 10 (ref 5–15)
BUN: 13 mg/dL (ref 6–20)
CHLORIDE: 95 mmol/L — AB (ref 101–111)
CO2: 22 mmol/L (ref 22–32)
Calcium: 9.4 mg/dL (ref 8.9–10.3)
Creatinine, Ser: 0.82 mg/dL (ref 0.44–1.00)
GFR calc Af Amer: >60 mL/min (ref >60)
Glucose, Bld: 127 mg/dL — ABNORMAL HIGH (ref 65–99)
POTASSIUM: 4 mmol/L (ref 3.5–5.1)
SODIUM: 127 mmol/L — AB (ref 135–145)

## 2017-10-05 MED ORDER — SODIUM CHLORIDE 0.9 % IV BOLUS (SEPSIS)
1000.0000 mL | Freq: Once | INTRAVENOUS | Status: AC
  Administered 2017-10-05: 1000 mL via INTRAVENOUS

## 2017-10-05 MED ORDER — HYDROMORPHONE HCL 1 MG/ML IJ SOLN
1.0000 mg | Freq: Once | INTRAMUSCULAR | Status: AC
  Administered 2017-10-05: 1 mg via INTRAVENOUS
  Filled 2017-10-05: qty 1

## 2017-10-05 MED ORDER — IOPAMIDOL (ISOVUE-370) INJECTION 76%
INTRAVENOUS | Status: AC
  Administered 2017-10-05: 100 mL via INTRAVENOUS
  Filled 2017-10-05: qty 100

## 2017-10-05 MED ORDER — IOPAMIDOL (ISOVUE-300) INJECTION 61%
INTRAVENOUS | Status: AC
  Filled 2017-10-05: qty 100

## 2017-10-05 MED ORDER — ONDANSETRON HCL 4 MG/2ML IJ SOLN
4.0000 mg | Freq: Once | INTRAMUSCULAR | Status: AC
  Administered 2017-10-05: 4 mg via INTRAVENOUS
  Filled 2017-10-05: qty 2

## 2017-10-05 MED ORDER — SODIUM CHLORIDE 0.9 % IJ SOLN
INTRAMUSCULAR | Status: AC
  Filled 2017-10-05: qty 50

## 2017-10-05 MED ORDER — DIAZEPAM 5 MG PO TABS: 5.0000 mg | ORAL_TABLET | Freq: Two times a day (BID) | ORAL | 0 refills | Status: DC

## 2017-10-05 MED ORDER — OXYCODONE-ACETAMINOPHEN 5-325 MG PO TABS: 1.0000 | ORAL_TABLET | ORAL | 0 refills | Status: DC | PRN

## 2017-10-05 NOTE — ED Triage Notes (Signed)
Pt c/o upper R chest pain, difficulty breathing, and cough. She has been experiencing lower back pain over the last few weeks and was dx'd with a crushed vertebrae and possibly lung cancer. However, the oncologist doesn't agree and has ordered a MRI for tomorrow. A&Ox4.

## 2017-10-05 NOTE — ED Provider Notes (Signed)
[facility] DEPT Provider Note   CSN: [id number] Arrival date & time: 10/05/17  2042     History   Chief Complaint Chief Complaint  Patient presents with  . Chest Pain  . Back Pain    HPI Haley [name] is a 67 y.o. female.  Pt presents to the ED today with right sided chest pain.  Pt has been diagnosed with a vertebral fracture that is suspected to be pathological.  She has seen Dr. Irene Limbo who has ordered PET scan and MRI and bone marrow biopsy.  These are all to be done this week.  The pt has been doing ok until this evening when she suddenly developed severe pain to the right chest.      Past Medical History:  Diagnosis Date  . Allergy   . Osteopenia     Patient Active Problem List   Diagnosis Date Noted  . Mood disorder ([place]) 05/12/2017  . Osteoarthritis 05/12/2017  . Osteopenia 05/06/2016  . Eustachian tube dysfunction 07/03/2015  . Allergic rhinitis 07/03/2015  . Tobacco use disorder 07/03/2015  . Hypertriglyceridemia 07/03/2015    Past Surgical History:  Procedure Laterality Date  . COLONOSCOPY    . COLONOSCOPY WITH PROPOFOL N/A 10/25/2014   Procedure: COLONOSCOPY WITH PROPOFOL;  Surgeon: Garlan Fair, MD;  Location: [facility] ENDOSCOPY;  Service: Endoscopy;  Laterality: N/A;  . IR RADIOLOGIST EVAL & MGMT  10/02/2017    OB History    No data available       Home Medications    Prior to Admission medications   Medication Sig Start Date End Date Taking? Authorizing Provider  acetaminophen (TYLENOL) 500 MG tablet Take 2 tablets (1,000 mg total) by mouth 2 (two) times daily. 09/30/17   Brunetta Genera, MD  albuterol (PROVENTIL HFA;VENTOLIN HFA) 108 (90 Base) MCG/ACT inhaler Inhale 2 puffs into the lungs every 6 (six) hours as needed for wheezing or shortness of breath. Patient not taking: Reported on 10/02/2017 07/30/17   Dena Billet B, PA-C  atorvastatin (LIPITOR) 40 MG tablet Take 1 tablet (40 mg total) by mouth daily.  08/21/17   Alycia Rossetti, MD  diazepam (VALIUM) 5 MG tablet Take 1 tablet (5 mg total) by mouth 2 (two) times daily. 10/05/17   Isla Pence, MD  Multiple Vitamin (MULTIVITAMIN WITH MINERALS) TABS tablet Take 1 tablet by mouth daily.    [provider]  ondansetron (ZOFRAN) 4 MG tablet Take 4 mg by mouth every 8 (eight) hours as needed.    [provider]  oxyCODONE (OXY IR/ROXICODONE) 5 MG immediate release tablet Take 1-2 tablets (5-10 mg total) by mouth every 4 (four) hours as needed for severe pain. 09/30/17   Brunetta Genera, MD  oxyCODONE-acetaminophen (PERCOCET/ROXICET) 5-325 MG tablet Take 1 tablet by mouth every 4 (four) hours as needed for severe pain. 10/05/17   Isla Pence, MD  senna-docusate (SENNA S) 8.6-50 MG tablet Take 2 tablets by mouth at bedtime. Patient not taking: Reported on 10/02/2017 09/30/17   Brunetta Genera, MD  tiZANidine (ZANAFLEX) 4 MG tablet Take 1 tablet (4 mg total) by mouth every 8 (eight) hours as needed. 09/30/17   Brunetta Genera, MD    Family History Family History  Problem Relation Age of Onset  . Diabetes Mother   . Hypertension Mother   . Diabetes Sister   . Hypertension Sister   . Hyperlipidemia Sister     Social History Social History   Tobacco Use  .  Smoking status: Current Every Day Smoker    Packs/day: 1.00    Years: 40.00    Pack years: 40.00    Types: Cigarettes  . Smokeless tobacco: Never Used  Substance Use Topics  . Alcohol use: Yes    Comment: rare social  . Drug use: No     Allergies   Patient has no known allergies.   Review of Systems Review of Systems  Respiratory: Positive for shortness of breath.   Cardiovascular: Positive for chest pain.  All other systems reviewed and are negative.    Physical Exam Updated Vital Signs BP 120/81   Pulse (!) 50   Temp 97.8 F (36.6 C) (Oral)   Resp 14   SpO2 92%   Physical Exam  Constitutional: She is oriented to person, place, and  time. She appears well-developed and well-nourished. She appears distressed.  HENT:  Head: Normocephalic and atraumatic.  Eyes: EOM are normal. Pupils are equal, round, and reactive to light.  Neck: Normal range of motion. Neck supple.  Cardiovascular: Normal rate, regular rhythm, intact distal pulses and normal pulses.  Pulmonary/Chest: Breath sounds normal. Tachypnea noted.  Abdominal: Soft. Bowel sounds are normal.  Musculoskeletal: Normal range of motion.       Right lower leg: Normal.       Left lower leg: Normal.  Neurological: She is alert and oriented to person, place, and time.  Skin: Skin is warm and dry. Capillary refill takes less than 2 seconds.  Psychiatric: Her behavior is normal. Her mood appears anxious.  Nursing note and vitals reviewed.    ED Treatments / Results  Labs (all labs ordered are listed, but only abnormal results are displayed) Labs Reviewed  BASIC METABOLIC PANEL - Abnormal; Notable for the following components:      Result Value   Sodium 127 (*)    Chloride 95 (*)    Glucose, Bld 127 (*)    All other components within normal limits  CBC - Abnormal; Notable for the following components:   MCHC 36.3 (*)    All other components within normal limits  I-STAT TROPONIN, ED    EKG  EKG Interpretation  Date/Time:  Sunday October 05 2017 21:21:10 EST Ventricular Rate:  93 PR Interval:    QRS Duration: 90 QT Interval:  328 QTC Calculation: 408 R Axis:   47 Text Interpretation:  Sinus tachycardia Ventricular bigeminy Nonspecific T abnormalities, diffuse leads No old tracing to compare Confirmed by Isla Pence [phone]) on 10/05/2017 9:28:21 PM       Radiology Dg Chest 2 View  Result Date: 10/05/2017 CLINICAL DATA:  Chest pain and shortness of breath EXAM: CHEST  2 VIEW COMPARISON:  Lumbar MRI September 24, 2017 FINDINGS: There is consolidation in the medial right base. Lungs elsewhere are clear. Heart size and pulmonary vascularity are normal.  No adenopathy. No pneumothorax. There is a burst type fracture of the L2 vertebral body, present on recent MR. IMPRESSION: Consolidation medial right base. Lungs elsewhere clear. No adenopathy. Burst type fracture L2 vertebral body, present on prior MR. Electronically Signed   By: Lowella Grip III M.D.   On: 10/05/2017 21:56   Ct Angio Chest Pe W And/or Wo Contrast  Result Date: 10/05/2017 CLINICAL DATA:  Pt c/o upper R chest pain, difficulty breathing, and cough. She has been experiencing lower back pain over the last few weeks and was dx'd with a crushed vertebrae and possibly lung cancer. EXAM: CT ANGIOGRAPHY CHEST WITH CONTRAST TECHNIQUE: Multidetector  CT imaging of the chest was performed using the standard protocol during bolus administration of intravenous contrast. Multiplanar CT image reconstructions and MIPs were obtained to evaluate the vascular anatomy. CONTRAST:  <See Chart> ISOVUE-370 IOPAMIDOL (ISOVUE-370) INJECTION 76% COMPARISON:  Current chest radiographs FINDINGS: Cardiovascular: Satisfactory opacification of the pulmonary arteries to the segmental level. No evidence of pulmonary embolism. Normal heart size. No pericardial effusion. There are mild, 3 vessel coronary artery calcifications. Aorta is normal in caliber. No dissection. Mild atherosclerotic disease noted along the arch and descending thoracic aorta. Mediastinum/Nodes: No neck base or axillary masses or adenopathy. Visualized thyroid is in appearance. Borderline enlarged right paratracheal, azygos level lymph node measuring 11 mm short axis. There is abnormal soft tissue that extends along the inferior right hilum and subcarinal mediastinum to surround the bronchus intermedius in occludes the more distal bronchus intermedius and right middle lobe bronchus. Lungs/Pleura: Complete atelectasis of the right lower lobe. With near complete atelectasis of the right middle lobe. Moderate to advanced centrilobular emphysema. No discrete  lung masses or nodules. No evidence of pneumonia or pulmonary edema. Small right pleural effusion. No pneumothorax. Upper Abdomen: Low-density liver lesions, 3 total seen, along the right lobe, measuring from 16 mm to 20 mm. Left adrenal mass measuring 16 mm. No acute findings in the upper abdomen. Musculoskeletal: No fracture or acute finding. No osteoblastic or osteolytic lesions. Review of the MIP images confirms the above findings. IMPRESSION: 1. No evidence of a pulmonary embolism. 2. Abnormal soft tissue, consistent with malignancy, surrounds the inferior right hilar structures and occludes the bronchus intermedius leading to complete atelectasis of the right lower lobe and significant atelectasis of the right middle lobe. There is a single borderline enlarged right paratracheal lymph node measuring 11 mm in short axis. 3. Three liver lesions are seen suspicious for metastatic disease. Enlarged left adrenal gland is also suspicious for metastatic disease. 4. No convincing pneumonia or evidence of pulmonary edema. 5. Moderate to advanced emphysema. Aortic Atherosclerosis (ICD10-I70.0) and Emphysema (ICD10-J43.9). Electronically Signed   By: Lajean Manes M.D.   On: 10/05/2017 23:01    Procedures Procedures (including critical care time)  Medications Ordered in ED Medications  iopamidol (ISOVUE-300) 61 % injection (not administered)  sodium chloride 0.9 % injection (not administered)  sodium chloride 0.9 % bolus 1,000 mL (0 mLs Intravenous Stopped 10/05/17 2307)  HYDROmorphone (DILAUDID) injection 1 mg (1 mg Intravenous Given 10/05/17 2157)  ondansetron (ZOFRAN) injection 4 mg (4 mg Intravenous Given 10/05/17 2156)  iopamidol (ISOVUE-370) 76 % injection (100 mLs Intravenous Contrast Given 10/05/17 2218)     Initial Impression / Assessment and Plan / ED Course  I have reviewed the triage vital signs and the nursing notes.  Pertinent labs & imaging results that were available during my care of the  patient were reviewed by me and considered in my medical decision making (see chart for details).    Pain is much better.  I suspect it is from her likely lung cancer.  Patient is in the middle of big work up with Dr. Irene Limbo.  She is stable for d/c with instructions to keep all her appointments.  I will try to control her pain better.  Return if worse.  Final Clinical Impressions(s) / ED Diagnoses   Final diagnoses:  Chest wall pain  Atelectasis of right lung  Mass of lower lobe of right lung    ED Discharge Orders        Ordered    oxyCODONE-acetaminophen (PERCOCET/ROXICET)  5-325 MG tablet  Every 4 hours PRN     10/05/17 2310    diazepam (VALIUM) 5 MG tablet  2 times daily     10/05/17 2310       Isla Pence, MD 10/05/17 2313

## 2017-10-06 ENCOUNTER — Ambulatory Visit (HOSPITAL_COMMUNITY)
Admission: RE | Admit: 2017-10-06 | Discharge: 2017-10-06 | Disposition: A | Discharge status: Home / Self Care | Payer: No Typology Code available for payment source | Source: Ambulatory Visit | Attending: Interventional Radiology | Admitting: Interventional Radiology

## 2017-10-06 ENCOUNTER — Other Ambulatory Visit (HOSPITAL_COMMUNITY): Payer: Self-pay | Admitting: Interventional Radiology

## 2017-10-06 DIAGNOSIS — C7951 Secondary malignant neoplasm of bone: Secondary | ICD-10-CM

## 2017-10-06 DIAGNOSIS — C801 Malignant (primary) neoplasm, unspecified: Secondary | ICD-10-CM

## 2017-10-06 DIAGNOSIS — R160 Hepatomegaly, not elsewhere classified: Secondary | ICD-10-CM | POA: Insufficient documentation

## 2017-10-06 DIAGNOSIS — M48061 Spinal stenosis, lumbar region without neurogenic claudication: Secondary | ICD-10-CM

## 2017-10-06 DIAGNOSIS — M47816 Spondylosis without myelopathy or radiculopathy, lumbar region: Secondary | ICD-10-CM

## 2017-10-06 DIAGNOSIS — M8448XA Pathological fracture, other site, initial encounter for fracture: Secondary | ICD-10-CM

## 2017-10-06 DIAGNOSIS — E279 Disorder of adrenal gland, unspecified: Secondary | ICD-10-CM | POA: Insufficient documentation

## 2017-10-06 LAB — MULTIPLE MYELOMA PANEL, SERUM
ALBUMIN SERPL ELPH-MCNC: 3.5 g/dL (ref 2.9–4.4)
Albumin/Glob SerPl: 1.2 (ref 0.7–1.7)
Alpha 1: 0.3 g/dL (ref 0.0–0.4)
Alpha2 Glob SerPl Elph-Mcnc: 1 g/dL (ref 0.4–1.0)
B-GLOBULIN SERPL ELPH-MCNC: 1.1 g/dL (ref 0.7–1.3)
GAMMA GLOB SERPL ELPH-MCNC: 0.6 g/dL (ref 0.4–1.8)
GLOBULIN, TOTAL: 3 g/dL (ref 2.2–3.9)
IgA: 128 mg/dL (ref 87–352)
IgG (Immunoglobin G), Serum: 727 mg/dL (ref 700–1600)
IgM (Immunoglobulin M), Srm: 77 mg/dL (ref 26–217)
Total Protein ELP: 6.5 g/dL (ref 6.0–8.5)

## 2017-10-06 MED ORDER — GADOBENATE DIMEGLUMINE 529 MG/ML IV SOLN
10.0000 mL | Freq: Once | INTRAVENOUS | Status: AC | PRN
  Administered 2017-10-06: 10 mL via INTRAVENOUS

## 2017-10-08 ENCOUNTER — Telehealth: Payer: Self-pay | Admitting: Hematology

## 2017-10-08 ENCOUNTER — Other Ambulatory Visit: Payer: Self-pay | Admitting: Radiology

## 2017-10-08 NOTE — Telephone Encounter (Signed)
10/08/17 @ 10:48 am spoke with patient to confirm Disability paperwork was successfully faxed to [employer] @ [phone] @ 10:12 am.  Patient requested a copy for her personal records be mailed to her home address.

## 2017-10-09 ENCOUNTER — Encounter: Payer: Self-pay | Admitting: *Deleted

## 2017-10-09 ENCOUNTER — Telehealth: Payer: Self-pay | Admitting: *Deleted

## 2017-10-09 ENCOUNTER — Encounter (HOSPITAL_COMMUNITY): Payer: Self-pay

## 2017-10-09 ENCOUNTER — Ambulatory Visit (HOSPITAL_COMMUNITY)
Admission: RE | Admit: 2017-10-09 | Discharge: 2017-10-09 | Disposition: A | Discharge status: Home / Self Care | Payer: No Typology Code available for payment source | Source: Ambulatory Visit | Attending: Hematology | Admitting: Hematology

## 2017-10-09 DIAGNOSIS — K869 Disease of pancreas, unspecified: Secondary | ICD-10-CM | POA: Insufficient documentation

## 2017-10-09 DIAGNOSIS — M8448XA Pathological fracture, other site, initial encounter for fracture: Secondary | ICD-10-CM | POA: Diagnosis present

## 2017-10-09 DIAGNOSIS — C7951 Secondary malignant neoplasm of bone: Secondary | ICD-10-CM | POA: Insufficient documentation

## 2017-10-09 DIAGNOSIS — C801 Malignant (primary) neoplasm, unspecified: Secondary | ICD-10-CM

## 2017-10-09 DIAGNOSIS — C3491 Malignant neoplasm of unspecified part of right bronchus or lung: Secondary | ICD-10-CM | POA: Insufficient documentation

## 2017-10-09 DIAGNOSIS — J439 Emphysema, unspecified: Secondary | ICD-10-CM | POA: Insufficient documentation

## 2017-10-09 DIAGNOSIS — C787 Secondary malignant neoplasm of liver and intrahepatic bile duct: Secondary | ICD-10-CM | POA: Diagnosis not present

## 2017-10-09 DIAGNOSIS — I700 Atherosclerosis of aorta: Secondary | ICD-10-CM | POA: Insufficient documentation

## 2017-10-09 LAB — GLUCOSE, CAPILLARY: GLUCOSE-CAPILLARY: 95 mg/dL (ref 65–99)

## 2017-10-09 MED ORDER — FLUDEOXYGLUCOSE F - 18 (FDG) INJECTION
6.3000 | Freq: Once | INTRAVENOUS | Status: AC
  Administered 2017-10-09: 6.3 via INTRAVENOUS

## 2017-10-09 NOTE — Telephone Encounter (Signed)
Patient calling to ask for refills on percocet and valium, that she received in the emergency room. Per dr Irene Limbo she is to take the oxycodone and zanaflex that was prescribed by him. Encouraged to keep appt for PET scan today.

## 2017-10-09 NOTE — Progress Notes (Signed)
HEMATOLOGY/ONCOLOGY CONSULTATION NOTE  Date of Service: 10/13/2017  Patient Care Team: Haley Rossetti, MD as PCP - General (Family Medicine)  CHIEF COMPLAINTS/PURPOSE OF CONSULTATION:  F/u for newly diagnosed metastatic malignancy.  HISTORY OF PRESENTING ILLNESS:   Haley [name] is a wonderful 67 y.o. female who has been referred to [place] by Orthopedists Dr. Vickki Hearing for evaluation and management of Bone Marrow Abnormality. Her PCP is Dr. Buelah Manis.   She presents to the clinic today accompanied by her sons and her daughter-in-law. She notes she has several issues since October 2018.  She had colitis in 05/2017 with significant diarrhea and abdominal cramping --now mostly resolved.  She was treated with antibiotics. Afterward she had bronchitis. She was given albuterol and continues to take it as she has remaining cough (smoking contributed to this).   On 08/07/17 she notes she has back pain in her lower back the past couple of months. 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 Next day she saw her physician, who had done a xray and told her she had arthritis. She was treated with prednisone and gabapentin. After no back pain improvements on treatment, she has yet to return to work. She went to orthopedist and had a Lumbar MRI on 09/25/17. This shows a compression fracture of the L2 with abnormalities in her bone marrow.   She quit smoking 09/24/17. She had been smoking 1 pack a day for 50 years, since she was 67 yo. She still has cough from smoking but not as significant.   She denies heart conditions or disease, GI issues or diseases or other significant medical issues. DM runs in her family but no known cancers, blood disorders, or gene mutations. Prior to her health in the past 6 months she has lead a independent overall healthy life. Her last mammogram in 04/2017 was negative. Last colonoscopy in 2015 and last [place] in 2017, were negative. She had a bone  density scan in 2009. She has never been on HR therapy.   Given her significant pain (up to 10/10). She currently takes hydrocodone 1 tablet q4-5hours. This only reduced her pain to 7-8/10. She takes Tizanidine 44m q5-6hours. She has a back brace that she does not use.   On review of symptoms, pt notes ongoing b/l hip pain, lower back pain which can radiate up the side and down her legs and buttocks. The pain can keep her up at night. She notes when she lays on her right side, her throat pain which causes her to cough. This started around time of bronchitis and has not improved. When she does cough she produces clear phlegm.    Interval History:   Haley MINAMIreturns today regarding her newly diagnosed metastatic malignancy of likely lung or pancreatic primary. The patient's last visit with [place] on 09/[place]/17. She is accompanied today by her two sons and daughter-in-law. She notes that her back pain has gotten a little better with her every 4 hour oxycodone, and that her pain has become tolerable. She recently went to the ED with SOB and discomfort under her right armpit and had a CTA cheset which showed no PE but noted Abnormal soft tissue, consistent with malignancy, surrounds the inferior right hilar structures and occludes the bronchus intermedius leading to complete atelectasis of the right lower lobe and significant atelectasis of the right middle lobe. There is a single borderline enlarged right paratracheal lymph node measuring 11 mm in short  axis.    Of note since the patient's last visit, the pt has had a PET on 10/09/17 which revealed Hypermetabolic lymphadenopathy is identified in the thoracic inlet bilaterally, mediastinum, and right hilum with associated hypermetabolic disease in the right infrahilar region. Additionally, hypermetabolic liver metastases, diffuse bone metastases and a 3.9 cm hypermetabolic mass in the head of the pancreas are evident. Imaging findings may well be related  to primary pancreatic adenocarcinoma with widespread metastatic involvement. Infrahilar right lung cancer with metastatic disease (including to the pancreas) is also a consideration.  Her bone marrow bx was canceled.  The patient's family notes that her urine has become brown and patient is noted to have new jaundice.  The pt notes continuing SOB and weakness.  The pt notes that her imminent back surgery is now not her greatest concern and given findings of widely metastatic malignancy wants to hold off on her RF ablation and vertebroplasty today.- discussed with Dr Laurence Ferrari.  On review of systems, pt reports that her urine has become brown recently, SOB, weakness, back pain, bilateral flank pain, abdominal pain, difficulty eating, and denies leg swelling, and any other symptoms.   Given her worsening symptoms and newly noted jaundice likely from extraheptic biliary obstruction we discussed and she was recommended admission to address the biliary obstruction and all get an expedited tissue biopsy due to concern for possible small cell lung cancer.  MEDICAL HISTORY:  Past Medical History:  Diagnosis Date   Allergy    Osteopenia     SURGICAL HISTORY: Past Surgical History:  Procedure Laterality Date   COLONOSCOPY     COLONOSCOPY WITH PROPOFOL N/A 10/25/2014   Procedure: COLONOSCOPY WITH PROPOFOL;  Surgeon: Garlan Fair, MD;  Location: [facility];  Service: Endoscopy;  Laterality: N/A;   IR RADIOLOGIST EVAL & MGMT  10/02/2017    SOCIAL HISTORY: Social History   Socioeconomic History   Marital status: Single    Spouse name: Not on file   Number of children: Not on file   Years of education: Not on file   Highest education level: Not on file  Social Needs   Financial resource strain: Not on file   Food insecurity - worry: Not on file   Food insecurity - inability: Not on file   Transportation needs - medical: Not on file   Transportation needs - non-medical:  Not on file  Occupational History   Not on file  Tobacco Use   Smoking status: Former Smoker    Packs/day: 1.00    Years: 40.00    Pack years: 40.00    Types: Cigarettes    Last attempt to quit: 09/24/2017    Years since quitting: 0.0   Smokeless tobacco: Never Used  Substance and Sexual Activity   Alcohol use: Yes    Comment: rare social   Drug use: No   Sexual activity: Not Currently  Other Topics Concern   Not on file  Social History Narrative   Not on file    FAMILY HISTORY: Family History  Problem Relation Age of Onset   Diabetes Mother    Hypertension Mother    Diabetes Sister    Hypertension Sister    Hyperlipidemia Sister     ALLERGIES:  has No Known Allergies.  MEDICATIONS:  No current facility-administered medications for this visit.    No current outpatient medications on file.   Facility-Administered Medications Ordered in Other Visits  Medication Dose Route Frequency Provider Last Rate Last Dose  0.9 %  sodium chloride infusion   Intravenous Continuous [name], MD 75 mL/hr at 10/14/17 0512     albuterol (PROVENTIL) (2.5 MG/3ML) 0.083% nebulizer solution 2.5 mg  2.5 mg Nebulization Q6H PRN Theodis Blaze, MD       enoxaparin (LOVENOX) injection 40 mg  40 mg Subcutaneous Q24H Theodis Blaze, MD   40 mg at 10/13/17 2205   HYDROmorphone (DILAUDID) injection 1 mg  1 mg Intravenous Q4H PRN Theodis Blaze, MD   1 mg at 10/14/17 0509   ondansetron (ZOFRAN) injection 4 mg  4 mg Intravenous Q6H PRN Theodis Blaze, MD       promethazine (PHENERGAN) injection 12.5 mg  12.5 mg Intravenous Q6H PRN Theodis Blaze, MD       traZODone (DESYREL) tablet 50 mg  50 mg Oral QHS PRN Theodis Blaze, MD   50 mg at 10/13/17 2205    REVIEW OF SYSTEMS:    .10 Point review of Systems was done is negative except as noted above.   PHYSICAL EXAMINATION: ECOG PERFORMANCE STATUS: 2 - Symptomatic, <50% confined to bed  . Vitals:   10/13/17 0915  BP: (!)  94/57  Pulse: 88  Resp: 18  Temp: (!) 97.5 F (36.4 C)  SpO2: 97%   Filed Weights   10/13/17 0915  Weight: 117 lb 3.2 oz (53.2 kg)   .Body mass index is 20.76 kg/m.  Marland Kitchen GENERAL:alert, in no acute distress and comfortable SKIN: no acute rashes, no significant lesions, icteric sclera EYES: conjunctiva are pink and non-injected, sclera anicteric OROPHARYNX: MMM, no exudates, no oropharyngeal erythema or ulceration NECK: supple, no JVD LYMPH:  no palpable lymphadenopathy in the cervical, axillary or inguinal regions LUNGS: decreased air entry rt base HEART: regular rate & rhythm ABDOMEN:  normoactive bowel sounds , non tender, not distended. Extremity: no pedal edema PSYCH: alert & oriented x 3 with fluent speech NEURO: no focal motor/sensory deficits    LABORATORY DATA:  I have reviewed the data as listed  . CBC Latest Ref Rng & Units 10/13/2017 10/05/2017  WBC 4.0 - 10.5 K/uL 5.7 7.1  Hemoglobin 12.0 - 15.0 g/dL 13.4 14.3  Hematocrit 36.0 - 46.0 % 37.3 39.4  Platelets 150 - 400 K/uL 248 248   . CMP Latest Ref Rng & Units 10/13/2017 10/05/2017  Glucose 65 - 99 mg/dL 102(H) 127(H)  BUN 6 - 20 mg/dL 12 13  Creatinine 0.44 - 1.00 mg/dL 0.50 0.82  Sodium 135 - 145 mmol/L 129(L) 127(L)  Potassium 3.5 - 5.1 mmol/L 4.2 4.0  Chloride 101 - 111 mmol/L 95(L) 95(L)  CO2 22 - 32 mmol/L 20(L) 22  Calcium 8.9 - 10.3 mg/dL 9.1 9.4  Total Protein 6.5 - 8.1 g/dL 6.8 -  Total Bilirubin 0.3 - 1.2 mg/dL 5.4(H) -  Alkaline Phos 38 - 126 U/L 470(H) -  AST 15 - 41 U/L 290(H) -  ALT 14 - 54 U/L 201(H) -   CA 19-9, CEA and AFP tumor markers pending  RADIOGRAPHIC STUDIES: I have personally reviewed the radiological images as listed and agreed with the findings in the report. Dg Chest 2 View  Result Date: 10/05/2017 CLINICAL DATA:  Chest pain and shortness of breath EXAM: CHEST  2 VIEW COMPARISON:  Lumbar MRI September 24, 2017 FINDINGS: There is consolidation in the medial right base. Lungs  elsewhere are clear. Heart size and pulmonary vascularity are normal. No adenopathy. No pneumothorax. There is a burst type fracture of  the L2 vertebral body, present on recent MR. IMPRESSION: Consolidation medial right base. Lungs elsewhere clear. No adenopathy. Burst type fracture L2 vertebral body, present on prior MR. Electronically Signed   By: Lowella Grip III M.D.   On: 10/05/2017 21:56   Ct Angio Chest Pe W And/or Wo Contrast  Result Date: 10/05/2017 CLINICAL DATA:  Pt c/o upper R chest pain, difficulty breathing, and cough. She has been experiencing lower back pain over the last few weeks and was dx'd with a crushed vertebrae and possibly lung cancer. EXAM: CT ANGIOGRAPHY CHEST WITH CONTRAST TECHNIQUE: Multidetector CT imaging of the chest was performed using the standard protocol during bolus administration of intravenous contrast. Multiplanar CT image reconstructions and MIPs were obtained to evaluate the vascular anatomy. CONTRAST:  <See Chart> ISOVUE-370 IOPAMIDOL (ISOVUE-370) INJECTION 76% COMPARISON:  Current chest radiographs FINDINGS: Cardiovascular: Satisfactory opacification of the pulmonary arteries to the segmental level. No evidence of pulmonary embolism. Normal heart size. No pericardial effusion. There are mild, 3 vessel coronary artery calcifications. Aorta is normal in caliber. No dissection. Mild atherosclerotic disease noted along the arch and descending thoracic aorta. Mediastinum/Nodes: No neck base or axillary masses or adenopathy. Visualized thyroid is in appearance. Borderline enlarged right paratracheal, azygos level lymph node measuring 11 mm short axis. There is abnormal soft tissue that extends along the inferior right hilum and subcarinal mediastinum to surround the bronchus intermedius in occludes the more distal bronchus intermedius and right middle lobe bronchus. Lungs/Pleura: Complete atelectasis of the right lower lobe. With near complete atelectasis of the right  middle lobe. Moderate to advanced centrilobular emphysema. No discrete lung masses or nodules. No evidence of pneumonia or pulmonary edema. Small right pleural effusion. No pneumothorax. Upper Abdomen: Low-density liver lesions, 3 total seen, along the right lobe, measuring from 16 mm to 20 mm. Left adrenal mass measuring 16 mm. No acute findings in the upper abdomen. Musculoskeletal: No fracture or acute finding. No osteoblastic or osteolytic lesions. Review of the MIP images confirms the above findings. IMPRESSION: 1. No evidence of a pulmonary embolism. 2. Abnormal soft tissue, consistent with malignancy, surrounds the inferior right hilar structures and occludes the bronchus intermedius leading to complete atelectasis of the right lower lobe and significant atelectasis of the right middle lobe. There is a single borderline enlarged right paratracheal lymph node measuring 11 mm in short axis. 3. Three liver lesions are seen suspicious for metastatic disease. Enlarged left adrenal gland is also suspicious for metastatic disease. 4. No convincing pneumonia or evidence of pulmonary edema. 5. Moderate to advanced emphysema. Aortic Atherosclerosis (ICD10-I70.0) and Emphysema (ICD10-J43.9). Electronically Signed   By: Lajean Manes M.D.   On: 10/05/2017 23:01   Mr Lumbar Spine W Wo Contrast  Result Date: 10/06/2017 CLINICAL DATA:  67 y/o F; bone metastasis with pathologic fracture of L2 vertebral body. Numbness and tingling beginning to radiate down her legs. EXAM: MRI LUMBAR SPINE WITHOUT AND WITH CONTRAST TECHNIQUE: Multiplanar and multiecho pulse sequences of the lumbar spine were obtained without and with intravenous contrast. CONTRAST:  67m MULTIHANCE GADOBENATE DIMEGLUMINE 529 MG/ML IV SOLN COMPARISON:  09/24/2017 MRI of the lumbar spine. 10/05/2017 CT of the chest. FINDINGS: Segmentation:  Standard. Alignment:  Physiologic. Vertebrae: Diffuse low T1 signal with mild enhancement throughout the visible lumbar  spine and sacrum patchy areas of fatty marrow sparing. Stable moderate compression deformity of L2 vertebral body with 7 mm retropulsion of superior endplate. Stable mild irregular enhancement of the L2 vertebral body. No new compression  deformity. No epidural disease. Conus medullaris and cauda equina: Conus extends to the L1 level. Conus and cauda equina appear normal. Paraspinal and other soft tissues: Numerous masses throughout the liver measuring up to 23 mm in segment 2 and ill-defined nodule in left adrenal gland, likely metastatic disease. Disc levels: L1-2: Retropulsion of L2 superior endplate results in stable moderate canal stenosis. No foraminal stenosis. L2-3: No significant disc displacement, foraminal stenosis, or canal stenosis. L3-4: Small disc bulge with mild facet and ligamentum flavum hypertrophy. No significant foraminal or canal stenosis. L4-5: Small disc bulge with moderate facet hypertrophy. No significant foraminal or canal stenosis. L5-S1: Disc bulge with endplate marginal osteophytes eccentric to the left and mild bilateral facet hypertrophy. Moderate left foraminal stenosis. No canal stenosis. IMPRESSION: 1. Stable L2 compression deformity with 7 mm retropulsion superior endplate resulting in moderate canal stenosis. 2. No abnormal enhancement of the visible cord or cauda equina. No new fracture. No epidural metastatic disease. 3. Diffuse decreased T1 signal throughout the visible spine and sacrum compatible with a fatty marrow replacement process which may be red marrow, neoplasm, or chemotherapy related response. 4. Stable lumbar spondylosis greatest at L5-S1 level where there is moderate left foraminal stenosis. 5. Multiple hepatic masses and left adrenal mass compatible with metastatic disease. Electronically Signed   By: Kristine Garbe M.D.   On: 10/06/2017 23:47   Nm Pet Image Initial (pi) Skull Base To Thigh  Result Date: 10/09/2017 CLINICAL DATA:  Initial treatment  strategy for metastatic disease of unknown primary. EXAM: NUCLEAR MEDICINE PET SKULL BASE TO THIGH TECHNIQUE: 6.3 mCi F-18 FDG was injected intravenously. Full-ring PET imaging was performed from the skull base to thigh after the radiotracer. CT data was obtained and used for attenuation correction and anatomic localization. FASTING BLOOD GLUCOSE:  Value: 95 mg/dl COMPARISON:  CT chest 10/05/2017.  Abdomen and pelvis CT 04/23/2017. FINDINGS: NECK: No hypermetabolic lymph nodes in the neck. CHEST: Mediastinal lymphadenopathy seen on recent CT chest is hypermetabolic. The subcarinal lymph node is measures an index with SUV max = 9.1. Mass lesion in the inferior right hilum is hypermetabolic with SUV max = 9.9. Hypermetabolic lymph nodes are seen in the thoracic inlet bilaterally, in the paratracheal space, and in the right hilum. Tiny right pleural effusion noted. Emphysema noted in the lungs bilaterally. ABDOMEN/PELVIS: Multiple hypermetabolic liver metastases are evident. 3.0 cm lesion identified in the subcapsular liver on image 101 demonstrates SUV max = 8.3. Other scattered hypermetabolic liver metastases are evident. 2.4 cm lesion lateral segment left liver on image 118 demonstrates SUV max = 8.8. 3.6 x 3.9 cm soft tissue mass likely in the anterior head of pancreas is hypermetabolic with SUV max = 8.1. Layering high attenuation in the gallbladder lumen likely vicarious excretion of contrast from recent abdomen and pelvis CT as no gallstones were evident on 05/24/2017. Also possible that this represents sludge or interval development of innumerable tiny mineralized gallstones. Mild intra and extrahepatic biliary duct dilatation is evident. Extrahepatic common duct measures 9 mm diameter and the common bile duct appears obliterated at the level of the above-described pancreatic head lesion. There is abdominal aortic atherosclerosis without aneurysm. SKELETON: Multiple hypermetabolic bone metastases are evident.  Hypermetabolic lesion in the left humeral head demonstrates SUV max = 6.5. Multiple hypermetabolic metastases are seen in the spine. L3 lesion demonstrates SUV max = 8.7. Hypermetabolic lesion in the left acetabulum demonstrates SUV max = 7.5 left femoral neck lesion demonstrates SUV max = 8.5. IMPRESSION: 1. Hypermetabolic  lymphadenopathy is identified in the thoracic inlet bilaterally, mediastinum, and right hilum with associated hypermetabolic disease in the right infrahilar region. Additionally, hypermetabolic liver metastases, diffuse bone metastases and a 3.9 cm hypermetabolic mass in the head of the pancreas are evident. Imaging findings may well be related to primary pancreatic adenocarcinoma with widespread metastatic involvement. Infrahilar right lung cancer with metastatic disease (including to the pancreas) is also a consideration. No pancreatic mass lesion was visible on the CT scan from 05/24/2017, even in retrospect. There is associated mild intra and extrahepatic biliary duct dilatation. EUS and/or abdominal MRI/MRCP may prove helpful to further evaluate. 2.  Aortic Atherosclerois (ICD10-170.0) 3.  Emphysema. ([id number]) Electronically Signed   By: Misty Stanley M.D.   On: 10/09/2017 17:22   Ir Radiologist Eval & Mgmt  Result Date: 10/02/2017 Please refer to notes tab for details about interventional procedure. (Op Note)  Mr Outside Films Spine  Result Date: 10/01/2017 This examination belongs to an outside facility and is stored here for comparison purposes only.  Contact the originating outside institution for any associated report or interpretation.   MRI Lumbar Spine 09/25/17  IMPRESSION:  1. Extensive marrow signal abnormality throughout the osseous structures of the visualized thoracolumbar spine, sacrum, and iliac bones concerning for metastases, multiple myeloma, or other nonspecific marrow infiltrative disorder. Suggest obtaining a PET/CT to evaluate extent of disease/evaluate  for a primary tumor.  2. L2 compression fracture, concerning for pathologic fracture given the extensive marrow abnormality with approximately 40% vertebral body height loss and 8.55m of retropulsion resulting in moderate narrowing of the spinal canal. A postcontrast MRI with fat saturation would help to evaluate the enhancements pattern of the bony disease and evaluate for potential abnormal soft issue extending beyond the cortex which is not definitively evident in this exam.  3. Moderate facet arthrophy with grade 1 anterolisthesis at L4-5.  Degenerative disc disease with moderate left foraminal stenosis at L5-S1.     ASSESSMENT & PLAN:   SBRIANNIA LABAis a 67y.o. caucasian female with    1. Newly diagnosed metastatic malignancy - concerning for metastatic carcinoma of lung primary vs pancreatic primary. Given speed of progression, pattern of involvement - concern to r/o small cell lung cancer.  2. L2 Compression Fracture - appears pathological on MRI  Concern for metastatic malignancy with extensive bone involvement. SPEP - unrevealing . Lab Results  Component Value Date   LDH 418 (H) 10/01/2017   3. 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 -if pancreatic mass biopsy not possible - would consult IR for bx of one  of the liver mets -pain mx -nutritional consultation -if pathology concerning for small cell lung cancer- will need expedited consideration of treatment. -will f/u as inpatient. -discussed plan with hospitalist Dr AReesa Chewand patient was [facility].  4. Back pain related to pathologic L2 fracture -continue using back  brace -continue prn Oxycodone with GI prophylaxis -will hold of on interventional procedure to L2 at this time.  5. Smoking history, productive cough  Smoked for 50 years, 1 pack a day  Has not had a Chest Xray or scan since 2008 She currently has moderate cough with clear phlegm ever since episode of bronchitis. She takes uses albuterol inhaler as needed.  Plan Discussed PET/CT findings Smoking cessation  5. 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  -Discussed the possibilities of this being lung or pancreatic cancer, uncurable and able to treat from palliative perspective. -Discussed that the patient's back pain,  and SOB are her most concerning symptoms at this point, but she wants to prioritize treating her cancer. -Discussed the targeted therapies that particular lung cancer mutations offer   -Pt relays that she does want to know what kind of cancer it is, and will likely want to treat the cancer.  -Cancelling the back procedure today, admitting her today.   6. Extra-hepatic biliary obstruction from her biliary tumor -Discussed that the pt appears to be getting jaundiced and the possible need for a stent.  -Discussed the possible need to admit the pt today given her jaundice, concern for pancreatic tumor obstructing biliary system, urinary symptoms, and her quickly increasing limiting symptoms of SOB and weakness. The pt noted that she would like to be admitted  today.    7. Moderate to sever protein malnutrition due to weight loss -nutritional consultation  8. SOB and Compressive atelectasis -will need to have PT evaluation to determine discharge needs   All of the patients questions were answered with apparent satisfaction. The patient knows to call the clinic with any problems, questions or concerns.  . The total time spent in the appointment was 45 minutes and more than 50% was on counseling and direct patient cares and co-ordination of care with hospitalist/inpatient team      Sullivan Lone MD [place] AAHIVMS [facility] Hematology/Oncology Physician [facility]  (Office):       [phone] (Work cell):  [phone] (Fax):           [phone]  10/13/2017 9:10 AM  This document serves as a record of services personally performed by Sullivan Lone, MD. It was created on his behalf by Baldwin Jamaica, a trained medical scribe. The creation of this record is based on the scribe's personal observations and the provider's statements to them.   .I have reviewed the above documentation for accuracy and completeness, and I agree with the above. Brunetta Genera MD MS

## 2017-10-10 ENCOUNTER — Other Ambulatory Visit: Payer: Self-pay | Admitting: Hematology

## 2017-10-10 ENCOUNTER — Other Ambulatory Visit: Payer: Self-pay | Admitting: Radiology

## 2017-10-10 ENCOUNTER — Other Ambulatory Visit: Payer: Self-pay | Admitting: General Surgery

## 2017-10-10 ENCOUNTER — Ambulatory Visit (HOSPITAL_COMMUNITY): Payer: 59

## 2017-10-10 ENCOUNTER — Telehealth: Payer: Self-pay

## 2017-10-10 DIAGNOSIS — C787 Secondary malignant neoplasm of liver and intrahepatic bile duct: Secondary | ICD-10-CM

## 2017-10-10 NOTE — Telephone Encounter (Signed)
Confirmed with pt that Dr. Irene Limbo does want to keep appt on Monday. Pt verbalized intent to come on Monday to see Dr. Irene Limbo, followed by procedure with Dr. Laurence Ferrari.

## 2017-10-13 ENCOUNTER — Encounter: Payer: Self-pay | Admitting: Hematology

## 2017-10-13 ENCOUNTER — Encounter (HOSPITAL_COMMUNITY): Payer: Self-pay

## 2017-10-13 ENCOUNTER — Ambulatory Visit (HOSPITAL_COMMUNITY): Payer: 59

## 2017-10-13 ENCOUNTER — Inpatient Hospital Stay (HOSPITAL_COMMUNITY)
Admission: AD | Admit: 2017-10-13 | Discharge: 2017-10-18 | DRG: 445 | Disposition: A | Discharge status: Home / Self Care | Payer: No Typology Code available for payment source | Source: Ambulatory Visit | Attending: Internal Medicine | Admitting: Internal Medicine

## 2017-10-13 ENCOUNTER — Ambulatory Visit (HOSPITAL_COMMUNITY)
Admission: RE | Admit: 2017-10-13 | Discharge: 2017-10-13 | Disposition: A | Discharge status: Home / Self Care | Payer: 59 | Source: Ambulatory Visit | Attending: Interventional Radiology | Admitting: Interventional Radiology

## 2017-10-13 ENCOUNTER — Encounter (HOSPITAL_COMMUNITY): Payer: Self-pay | Admitting: *Deleted

## 2017-10-13 ENCOUNTER — Other Ambulatory Visit: Payer: Self-pay

## 2017-10-13 ENCOUNTER — Inpatient Hospital Stay (HOSPITAL_BASED_OUTPATIENT_CLINIC_OR_DEPARTMENT_OTHER): Payer: No Typology Code available for payment source | Admitting: Hematology

## 2017-10-13 ENCOUNTER — Telehealth: Payer: Self-pay | Admitting: *Deleted

## 2017-10-13 VITALS — BP 94/57 | HR 88 | Temp 97.5°F | Resp 18 | Ht 63.0 in | Wt 117.2 lb

## 2017-10-13 DIAGNOSIS — C3491 Malignant neoplasm of unspecified part of right bronchus or lung: Secondary | ICD-10-CM | POA: Diagnosis present

## 2017-10-13 DIAGNOSIS — M545 Low back pain: Secondary | ICD-10-CM

## 2017-10-13 DIAGNOSIS — E876 Hypokalemia: Secondary | ICD-10-CM | POA: Diagnosis not present

## 2017-10-13 DIAGNOSIS — R188 Other ascites: Secondary | ICD-10-CM | POA: Diagnosis not present

## 2017-10-13 DIAGNOSIS — C7972 Secondary malignant neoplasm of left adrenal gland: Secondary | ICD-10-CM | POA: Diagnosis present

## 2017-10-13 DIAGNOSIS — C801 Malignant (primary) neoplasm, unspecified: Secondary | ICD-10-CM

## 2017-10-13 DIAGNOSIS — M8458XA Pathological fracture in neoplastic disease, other specified site, initial encounter for fracture: Secondary | ICD-10-CM | POA: Diagnosis present

## 2017-10-13 DIAGNOSIS — E871 Hypo-osmolality and hyponatremia: Secondary | ICD-10-CM | POA: Diagnosis present

## 2017-10-13 DIAGNOSIS — X58XXXS Exposure to other specified factors, sequela: Secondary | ICD-10-CM

## 2017-10-13 DIAGNOSIS — K831 Obstruction of bile duct: Secondary | ICD-10-CM

## 2017-10-13 DIAGNOSIS — C7989 Secondary malignant neoplasm of other specified sites: Secondary | ICD-10-CM | POA: Diagnosis not present

## 2017-10-13 DIAGNOSIS — C799 Secondary malignant neoplasm of unspecified site: Secondary | ICD-10-CM | POA: Diagnosis not present

## 2017-10-13 DIAGNOSIS — K5900 Constipation, unspecified: Secondary | ICD-10-CM | POA: Diagnosis not present

## 2017-10-13 DIAGNOSIS — F17210 Nicotine dependence, cigarettes, uncomplicated: Secondary | ICD-10-CM | POA: Diagnosis present

## 2017-10-13 DIAGNOSIS — C250 Malignant neoplasm of head of pancreas: Secondary | ICD-10-CM | POA: Diagnosis present

## 2017-10-13 DIAGNOSIS — C7951 Secondary malignant neoplasm of bone: Secondary | ICD-10-CM | POA: Diagnosis present

## 2017-10-13 DIAGNOSIS — J449 Chronic obstructive pulmonary disease, unspecified: Secondary | ICD-10-CM | POA: Diagnosis present

## 2017-10-13 DIAGNOSIS — R17 Unspecified jaundice: Secondary | ICD-10-CM | POA: Diagnosis not present

## 2017-10-13 DIAGNOSIS — M25552 Pain in left hip: Secondary | ICD-10-CM | POA: Diagnosis not present

## 2017-10-13 DIAGNOSIS — M8448XA Pathological fracture, other site, initial encounter for fracture: Secondary | ICD-10-CM | POA: Diagnosis not present

## 2017-10-13 DIAGNOSIS — C771 Secondary and unspecified malignant neoplasm of intrathoracic lymph nodes: Secondary | ICD-10-CM | POA: Diagnosis present

## 2017-10-13 DIAGNOSIS — M8448XS Pathological fracture, other site, sequela: Secondary | ICD-10-CM

## 2017-10-13 DIAGNOSIS — C7970 Secondary malignant neoplasm of unspecified adrenal gland: Secondary | ICD-10-CM | POA: Diagnosis not present

## 2017-10-13 DIAGNOSIS — G893 Neoplasm related pain (acute) (chronic): Secondary | ICD-10-CM

## 2017-10-13 DIAGNOSIS — R918 Other nonspecific abnormal finding of lung field: Secondary | ICD-10-CM | POA: Diagnosis not present

## 2017-10-13 DIAGNOSIS — Z79899 Other long term (current) drug therapy: Secondary | ICD-10-CM

## 2017-10-13 DIAGNOSIS — M1990 Unspecified osteoarthritis, unspecified site: Secondary | ICD-10-CM

## 2017-10-13 DIAGNOSIS — M8580 Other specified disorders of bone density and structure, unspecified site: Secondary | ICD-10-CM | POA: Diagnosis not present

## 2017-10-13 DIAGNOSIS — X58XXXA Exposure to other specified factors, initial encounter: Secondary | ICD-10-CM | POA: Diagnosis not present

## 2017-10-13 DIAGNOSIS — C787 Secondary malignant neoplasm of liver and intrahepatic bile duct: Secondary | ICD-10-CM

## 2017-10-13 DIAGNOSIS — R7989 Other specified abnormal findings of blood chemistry: Secondary | ICD-10-CM

## 2017-10-13 DIAGNOSIS — R945 Abnormal results of liver function studies: Secondary | ICD-10-CM

## 2017-10-13 DIAGNOSIS — M25551 Pain in right hip: Secondary | ICD-10-CM | POA: Diagnosis not present

## 2017-10-13 DIAGNOSIS — D7589 Other specified diseases of blood and blood-forming organs: Secondary | ICD-10-CM | POA: Diagnosis not present

## 2017-10-13 DIAGNOSIS — C3490 Malignant neoplasm of unspecified part of unspecified bronchus or lung: Secondary | ICD-10-CM | POA: Diagnosis present

## 2017-10-13 LAB — COMPREHENSIVE METABOLIC PANEL
ALT: 201 U/L — AB (ref 14–54)
AST: 290 U/L — AB (ref 15–41)
Albumin: 3.2 g/dL — ABNORMAL LOW (ref 3.5–5.0)
Alkaline Phosphatase: 470 U/L — ABNORMAL HIGH (ref 38–126)
Anion gap: 14 (ref 5–15)
BUN: 12 mg/dL (ref 6–20)
CHLORIDE: 95 mmol/L — AB (ref 101–111)
CO2: 20 mmol/L — AB (ref 22–32)
CREATININE: 0.5 mg/dL (ref 0.44–1.00)
Calcium: 9.1 mg/dL (ref 8.9–10.3)
GFR calc non Af Amer: >60 mL/min (ref >60)
Glucose, Bld: 102 mg/dL — ABNORMAL HIGH (ref 65–99)
POTASSIUM: 4.2 mmol/L (ref 3.5–5.1)
SODIUM: 129 mmol/L — AB (ref 135–145)
Total Bilirubin: 5.4 mg/dL — ABNORMAL HIGH (ref 0.3–1.2)
Total Protein: 6.8 g/dL (ref 6.5–8.1)

## 2017-10-13 LAB — CBC
HCT: 37.3 % (ref 36.0–46.0)
Hemoglobin: 13.4 g/dL (ref 12.0–15.0)
MCH: 30.5 pg (ref 26.0–34.0)
MCHC: 35.9 g/dL (ref 30.0–36.0)
MCV: 85 fL (ref 78.0–100.0)
PLATELETS: 248 10*3/uL (ref 150–400)
RBC: 4.39 MIL/uL (ref 3.87–5.11)
RDW: 12.9 % (ref 11.5–15.5)
WBC: 5.7 10*3/uL (ref 4.0–10.5)

## 2017-10-13 MED ORDER — SODIUM CHLORIDE 0.9 % IV SOLN
INTRAVENOUS | Status: DC
  Administered 2017-10-14 – 2017-10-18 (×7): via INTRAVENOUS

## 2017-10-13 MED ORDER — ENOXAPARIN SODIUM 40 MG/0.4ML ~~LOC~~ SOLN
40.0000 mg | SUBCUTANEOUS | Status: DC
  Administered 2017-10-13: 40 mg via SUBCUTANEOUS
  Filled 2017-10-13: qty 0.4

## 2017-10-13 MED ORDER — ONDANSETRON HCL 4 MG/2ML IJ SOLN: 4.0000 mg | Freq: Four times a day (QID) | INTRAMUSCULAR | Status: DC | PRN

## 2017-10-13 MED ORDER — PROMETHAZINE HCL 25 MG/ML IJ SOLN: 12.5000 mg | Freq: Four times a day (QID) | INTRAMUSCULAR | Status: DC | PRN

## 2017-10-13 MED ORDER — ALBUTEROL SULFATE (2.5 MG/3ML) 0.083% IN NEBU: 2.5000 mg | INHALATION_SOLUTION | Freq: Four times a day (QID) | RESPIRATORY_TRACT | Status: DC | PRN

## 2017-10-13 MED ORDER — HYDROMORPHONE HCL 1 MG/ML IJ SOLN
1.0000 mg | INTRAMUSCULAR | Status: DC | PRN
  Administered 2017-10-13 – 2017-10-14 (×5): 1 mg via INTRAVENOUS
  Filled 2017-10-13 (×5): qty 1

## 2017-10-13 MED ORDER — TRAZODONE HCL 50 MG PO TABS
50.0000 mg | ORAL_TABLET | Freq: Every evening | ORAL | Status: DC | PRN
  Administered 2017-10-13 – 2017-10-18 (×3): 50 mg via ORAL
  Filled 2017-10-13 (×3): qty 1

## 2017-10-13 NOTE — Telephone Encounter (Signed)
Report called to [place], RN for room 1330.  Let RN know patient would be coming from home.

## 2017-10-13 NOTE — Telephone Encounter (Signed)
Direct admit set up per Dr. Irene Limbo under Dr. Reesa Chew (hospitalist service).  Pt has possible biliary obstruction, new metastatic carcinoma, SOB, back pain.  Per patient placement, no beds available, will be contacted when discharges occur.  Patient stated she lives close to cancer center, per Dr. Irene Limbo, ok for patient to go home and wait for bed placement.  Patient placement contacted around 2pm, no beds available.  Patients daughter called approximately 3:30pm stating they may just go to emergency room, in hopes of getting patient relief of current symptoms sooner than later.  This RN agreed with plan.  Will update family if bed becomes available in the mean time.

## 2017-10-13 NOTE — Patient Instructions (Signed)
Thank you for choosing [facility] to provide your oncology and hematology care.  To afford each patient quality time with our providers, please arrive 30 minutes before your scheduled appointment time.  If you arrive late for your appointment, you may be asked to reschedule.  We strive to give you quality time with our providers, and arriving late affects you and other patients whose appointments are after yours.   If you are a no show for multiple scheduled visits, you may be dismissed from the clinic at the providers discretion.    Again, thank you for choosing [facility], our hope is that these requests will decrease the amount of time that you wait before being seen by our physicians.  ______________________________________________________________________  Should you have questions after your visit to the [facility], please contact our office at [phone] between the hours of 8:30 and 4:30 p.m.    Voicemails left after 4:30p.m will not be returned until the following business day.    For prescription refill requests, please have your pharmacy contact us directly.  Please also try to allow 48 hours for prescription requests.    Please contact the scheduling department for questions regarding scheduling.  For scheduling of procedures such as PET scans, CT scans, MRI, Ultrasound, etc please contact central scheduling at [phone].    Resources For Cancer Patients and Caregivers:   Oncolink.org:  A wonderful resource for patients and healthcare providers for information regarding your disease, ways to tract your treatment, what to expect, etc.     American Cancer Society:  800-227-2345  Can help patients locate various types of support and financial assistance  Cancer Care: 1-800-813-HOPE (4673) Provides financial assistance, online support groups, medication/co-pay assistance.    [place] DSS:  336-641-3447 Where to apply for food  stamps, Medicaid, and utility assistance  Medicare Rights Center: 800-333-4114 Helps people with Medicare understand their rights and benefits, navigate the Medicare system, and secure the quality healthcare they deserve  SCAT: 336-333-6589  [employer]'s shared-ride transportation service for eligible riders who have a disability that prevents them from riding the fixed route bus.    For additional information on assistance programs please contact our social worker:   Grier Hock/Abigail Elmore:  [phone]            

## 2017-10-13 NOTE — H&P (Addendum)
History and Physical    Haley [name] [name]:[id number] DOB: [birth year] DOA: 10/13/2017  Referring MD/NP/PA: Dr. Irene Limbo, oncologist   PCP: Haley Rossetti, MD   Patient coming from: home, direct admission from cancer center  Chief Complaint: nausea and abd pain, poor oral intake   HPI: Haley [name] is a 67 y.o. 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 Pt was at the cancer office today for evaluation of progressively worsening abd pain, mostly in the upper quadrants, sharp and shooting, intermittent and 7/10 in severity when present, occasionally but not consistently radiating to the back, associated with nausea, poor oral intake, decreased urine output. Pt also reports ongoing generalized weakness and malaise. Pt denies fevers, chills, chest pain.   Recent imaging studies:  PET on 3/55/73  - Hypermetabolic lymphadenopathy in thoracic inlet bilaterally, mediastinum, right hilum with associated hypermetabolic disease in right infrahilar region - hypermetabolic liver metastases, diffuse bone metastases and a 3.9 cm hypermetabolic mass in the head of the pancreas, ? primary pancreatic adenocarcinoma with widespread metastatic involvement - Infrahilar right lung cancer with metastatic disease (including to the pancreas) is also a consideration - No pancreatic mass lesion was visible on the CT scan from 05/24/2017, even in retrospect.  - There is associated mild intra and extrahepatic biliary duct dilatation. EUS and/or abdominal MRI/MRCP may prove helpful to further evaluate  Lumbar Spine MR  10/06/17 1. Stable L2 compression deformity with 7 mm retropulsion superior endplate resulting in moderate canal  stenosis. 2. No abnormal enhancement of the visible cord or cauda equina. No new fracture. No epidural metastatic disease. 3. Diffuse decreased T1 signal throughout the visible spine and sacrum compatible with a fatty marrow replacement process which may be red marrow, neoplasm, or chemotherapy related response. 4. Stable lumbar spondylosis greatest at L5-S1 level where there is moderate left foraminal stenosis. 5. Multiple hepatic masses and left adrenal mass compatible with metastatic disease.   CXR completed  10/05/17  Consolidation medial right base. Lungs elsewhere clear. No adenopathy. Burst type fracture L2 vertebral body, present on prior MR.  Chest CT  10/05/17  1. No evidence of a pulmonary embolism.  2. Abnormal soft tissue, consistent with malignancy, surrounds the inferior right hilar structures and occludes the bronchus intermedius leading to complete atelectasis of the right lower lobe and significant atelectasis of the right middle lobe. There is a single borderline enlarged right paratracheal lymph node measuring 11 mm in short axis. 3. Three liver lesions are seen suspicious for metastatic disease. Enlarged left adrenal gland is also suspicious for metastatic disease. 4. No convincing pneumonia or evidence of pulmonary edema. 5. Moderate to advanced emphysema.   Review of Systems:  Constitutional: Negative for fever, chills, diaphoresis HENT: Negative for ear pain, nosebleeds, congestion, facial swelling, rhinorrhea, neck pain, neck stiffness and ear discharge.   Eyes: Negative for pain, discharge, redness, itching and visual disturbance.  Respiratory: Negative for cough, choking, wheezing and stridor.   Cardiovascular: Negative for chest pain, palpitations and leg swelling.  Gastrointestinal: Negative for abdominal distention.  Genitourinary: Negative for dysuria, urgency, frequency, hematuria, flank pain Musculoskeletal: Negative for arthralgias and gait problem.    Neurological: Negative for dizziness, tremors,  seizures, syncope Hematological: Negative for adenopathy. Does not bruise/bleed easily.  Psychiatric/Behavioral: Negative for hallucinations, behavioral problems, confusion, dysphoric mood, decreased concentration and agitation.   Past Medical History:  Diagnosis Date  . Allergy   . Osteopenia     Past Surgical History:  Procedure Laterality Date  . COLONOSCOPY    . COLONOSCOPY WITH PROPOFOL N/A 10/25/2014   Procedure: COLONOSCOPY WITH PROPOFOL;  Surgeon: Garlan Fair, MD;  Location: [facility] ENDOSCOPY;  Service: Endoscopy;  Laterality: N/A;  . IR RADIOLOGIST EVAL & MGMT  10/02/2017   Social Hx:  reports that she has been smoking cigarettes.  She has a 40.00 pack-year smoking history. she has never used smokeless tobacco. She reports that she drinks alcohol. She reports that she does not use drugs.  No Known Allergies  Family History  Problem Relation Age of Onset  . Diabetes Mother   . Hypertension Mother   . Diabetes Sister   . Hypertension Sister   . Hyperlipidemia Sister     Prior to Admission medications   Medication Sig Start Date End Date Taking? Authorizing Provider  atorvastatin (LIPITOR) 40 MG tablet Take 1 tablet (40 mg total) by mouth daily. 08/21/17   Northwood, Modena Nunnery, MD  CHANTIX STARTING MONTH PAK 0.5 MG X 11 & 1 MG X 42 tablet  08/29/17   [provider]  gabapentin (NEURONTIN) 300 MG capsule  08/18/17   [provider]  oxyCODONE (OXY IR/ROXICODONE) 5 MG immediate release tablet Take 1-2 tablets (5-10 mg total) by mouth every 4 (four) hours as needed for severe pain. 09/30/17   Brunetta Genera, MD  oxyCODONE-acetaminophen (PERCOCET/ROXICET) 5-325 MG tablet Take 1 tablet by mouth every 4 (four) hours as needed for severe pain. 10/05/17   Isla Pence, MD  predniSONE (STERAPRED UNI-PAK 21 TAB) 10 MG (21) TBPK tablet  08/18/17   [provider]  senna-docusate (SENNA S) 8.6-50 MG tablet Take  2 tablets by mouth at bedtime. 09/30/17   Brunetta Genera, MD  tiZANidine (ZANAFLEX) 4 MG tablet Take 1 tablet (4 mg total) by mouth every 8 (eight) hours as needed. 09/30/17   Brunetta Genera, MD   Physical Exam: There were no vitals filed for this visit.  Constitutional: NAD, calm, in mild distress due to pain  There were no vitals filed for this visit. Eyes: PERRL, lids and conjunctivae normal, icteric sclera  ENMT: Mucous membranes are dry. Posterior pharynx clear of any exudate or lesions.Normal dentition.  Neck: normal, supple, no masses, no thyromegaly Respiratory: resp effort stable, rhonchi at bases, no wheezing  Cardiovascular: Regular rate and rhythm, no murmurs / rubs / gallops. No extremity edema. 2+ pedal pulses. No carotid bruits.  Abdomen: tender on palpation in the upper abd quadrants, no guarding and no rebound, BS + Musculoskeletal: no clubbing / cyanosis. No joint deformity upper and lower extremities. Good ROM, no contractures. Normal muscle tone.  Skin: jaundiced Neurologic: CN 2-12 grossly intact. Sensation intact, DTR normal. Strength 5/5 in all 4.  Psychiatric: Normal judgment and insight. Alert and oriented x 3. Normal mood.   Labs on Admission: I have personally reviewed following labs and imaging studies  CBG: Recent Labs  Lab 10/09/17 1516  GLUCAP 95   Urine analysis:    Component Value Date/Time   COLORURINE YELLOW 05/24/2017 1021   APPEARANCEUR CLEAR 05/24/2017 1021   LABSPEC 1.011 05/24/2017 1021   PHURINE 6.0 05/24/2017 1021   GLUCOSEU NEGATIVE 05/24/2017 1021   HGBUR MODERATE (A)  05/24/2017 [place] 05/24/2017 [place] 05/24/2017 [place] 05/24/2017 1021   NITRITE NEGATIVE 05/24/2017 1021   LEUKOCYTESUR NEGATIVE 05/24/2017 1021   EKG: not done   Assessment/Plan Active Problems: Abd pain, Nausea, jaundice - certainly concern for pancreatic cancer causing symptoms - will start  with obtaining blood work, vital sings, cbc, CMP - place on IVF  - provide analgesia and antiemetics as needed  - keep NPO but allow ice chips for now - abd [place] requested for AM - GI team consulted for assistance, will see in AM after we have blood work and imaging studies available   Stage IV metastatic cancer - of lung vs pancreatic primary - IR consulted for recommendations on further biopsy, suspect liver lesion can be biopsied   COPD, hx of smoking - ok to use bronchodilators as needed   DVT prophylaxis: Lovenox SQ Code Status: Full  Family Communication: Pt and family updated at bedside Disposition Plan: to be determined  Consults called: GI  Admission status: Inpatient    Faye Ramsay MD [facility] Pager [phone]  If 7PM-7AM, please contact night-coverage [email or web address] Password [facility]  10/13/2017, 4:57 PM

## 2017-10-14 DIAGNOSIS — C799 Secondary malignant neoplasm of unspecified site: Secondary | ICD-10-CM

## 2017-10-14 DIAGNOSIS — C3490 Malignant neoplasm of unspecified part of unspecified bronchus or lung: Secondary | ICD-10-CM

## 2017-10-14 DIAGNOSIS — E871 Hypo-osmolality and hyponatremia: Secondary | ICD-10-CM

## 2017-10-14 DIAGNOSIS — C801 Malignant (primary) neoplasm, unspecified: Secondary | ICD-10-CM

## 2017-10-14 DIAGNOSIS — C7951 Secondary malignant neoplasm of bone: Secondary | ICD-10-CM

## 2017-10-14 DIAGNOSIS — K831 Obstruction of bile duct: Secondary | ICD-10-CM

## 2017-10-14 LAB — COMPREHENSIVE METABOLIC PANEL
ALBUMIN: 2.9 g/dL — AB (ref 3.5–5.0)
ALT: 192 U/L — ABNORMAL HIGH (ref 14–54)
AST: 275 U/L — AB (ref 15–41)
Alkaline Phosphatase: 419 U/L — ABNORMAL HIGH (ref 38–126)
Anion gap: 13 (ref 5–15)
BUN: 10 mg/dL (ref 6–20)
CO2: 22 mmol/L (ref 22–32)
Calcium: 8.9 mg/dL (ref 8.9–10.3)
Chloride: 96 mmol/L — ABNORMAL LOW (ref 101–111)
Creatinine, Ser: 0.54 mg/dL (ref 0.44–1.00)
GFR calc Af Amer: >60 mL/min (ref >60)
GFR calc non Af Amer: >60 mL/min (ref >60)
GLUCOSE: 93 mg/dL (ref 65–99)
POTASSIUM: 4.3 mmol/L (ref 3.5–5.1)
SODIUM: 131 mmol/L — AB (ref 135–145)
Total Bilirubin: 4.7 mg/dL — ABNORMAL HIGH (ref 0.3–1.2)
Total Protein: 6.1 g/dL — ABNORMAL LOW (ref 6.5–8.1)

## 2017-10-14 LAB — CBC
HEMATOCRIT: 36.7 % (ref 36.0–46.0)
Hemoglobin: 12.9 g/dL (ref 12.0–15.0)
MCH: 30.4 pg (ref 26.0–34.0)
MCHC: 35.1 g/dL (ref 30.0–36.0)
MCV: 86.6 fL (ref 78.0–100.0)
Platelets: 218 10*3/uL (ref 150–400)
RBC: 4.24 MIL/uL (ref 3.87–5.11)
RDW: 13.1 % (ref 11.5–15.5)
WBC: 5.5 10*3/uL (ref 4.0–10.5)

## 2017-10-14 LAB — LIPASE, BLOOD: Lipase: 333 U/L — ABNORMAL HIGH (ref 11–51)

## 2017-10-14 LAB — HIV ANTIBODY (ROUTINE TESTING W REFLEX): HIV Screen 4th Generation wRfx: NONREACTIVE

## 2017-10-14 MED ORDER — PROMETHAZINE HCL 25 MG/ML IJ SOLN: 6.2500 mg | Freq: Four times a day (QID) | INTRAMUSCULAR | Status: DC | PRN

## 2017-10-14 MED ORDER — HYDROMORPHONE HCL 1 MG/ML IJ SOLN
1.0000 mg | INTRAMUSCULAR | Status: DC | PRN
  Administered 2017-10-14 – 2017-10-18 (×20): 1 mg via INTRAVENOUS
  Filled 2017-10-14 (×20): qty 1

## 2017-10-14 NOTE — Progress Notes (Signed)
Called MRI 2x, they are not able to do MRCP tonight, due to emergency MRI from ED. Patient was made aware.

## 2017-10-14 NOTE — Progress Notes (Signed)
Patient ID: Haley [name], female   DOB: 11/13/1950, 67 y.o.   MRN: [id number]    PROGRESS NOTE  Haley [name]  GLO:[id number] DOB: [birth year] DOA: 10/13/2017  PCP: Alycia Rossetti, MD   Brief Narrative:  67 y.o. 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 Pt was at the cancer office for evaluation of progressively worsening abd pain, mostly in the upper quadrants, sharp and shooting, intermittent and 7/10 in severity, associated with nausea, poor oral intake, decreased urine output.  Recent imaging studies:  PET on 1/88/41  - Hypermetabolic lymphadenopathy in thoracic inlet bilaterally, mediastinum, right hilum with associated hypermetabolic disease in right infrahilar region - hypermetabolic liver metastases, diffuse bone metastases and a 3.9 cm hypermetabolic mass in the head of the pancreas, ? primary pancreatic adenocarcinoma with widespread metastatic involvement - Infrahilar right lung cancer with metastatic disease (including to the pancreas) is also a consideration - No pancreatic mass lesion was visible on the CT scan from 05/24/2017, even in retrospect.  - There is associated mild intra and extrahepatic biliary duct dilatation. EUS and/or abdominal MRI/MRCP may prove helpful to further evaluate  Lumbar Spine MR  10/06/17 1. Stable L2 compression deformity with 7 mm retropulsion superior endplate resulting in moderate canal stenosis. 2. No abnormal enhancement of the visible cord or cauda equina. No new fracture. No epidural metastatic disease. 3. Diffuse decreased T1 signal throughout the visible spine and sacrum compatible with a fatty marrow replacement process which may be red marrow,  neoplasm, or chemotherapy related response. 4. Stable lumbar spondylosis greatest at L5-S1 level where there is moderate left foraminal stenosis. 5. Multiple hepatic masses and left adrenal mass compatible with metastatic disease.  CXRcompleted  10/05/17 Consolidation medial right base. Lungs elsewhere clear. No adenopathy. Burst type fracture L2 vertebral body, present on prior MR.  Chest CT  10/05/17  1. No evidence of a pulmonary embolism.  2. Abnormal soft tissue, consistent with malignancy, surrounds the inferior right hilar structures and occludes the bronchus intermedius leading to complete atelectasis of the right lower lobe and significant atelectasis of the right middle lobe. There is a single borderline enlarged right paratracheal lymph node measuring 11 mm in short axis. 3. Three liver lesions are seen suspicious for metastatic disease. Enlarged left adrenal gland is also suspicious for metastatic disease. 4. No convincing pneumonia or evidence of pulmonary edema. 5. Moderate to advanced emphysema.  Assessment & Plan: Abd pain, Nausea, jaundice - certainly concern for pancreatic cancer causing symptoms - LFT elevated as expected but slightly better this morning  - plan for MRCP, appreciate GI team assistance - continue to provide IVF, analgesia and antiemetics as needed   Stage IV metastatic cancer - of lung vs pancreatic primary - IR consulted for recommendations on further biopsy, suspect liver lesion can be biopsied  - appreciate recommendations   COPD, hx of smoking - resp status stable this AM   DVT prophylaxis: Lovenox SQ Code Status: Full  Family Communication: Patient at bedside  Disposition Plan: to be determined  Consultants:   GI  IR  Procedures:   None  Antimicrobials:   None  Subjective: Pt reports ongoing pain but overall better.  Objective: Vitals:   10/13/17 1615 10/13/17 2046 10/14/17 0449 10/14/17 1347  BP: 126/73 (!) 117/49  (!) 110/51 122/61  Pulse: (!) 48 88 93 90  Resp: 18 18 18 16   Temp: 98.2 F (36.8 C) 98.7 F (37.1 C) 98.1 F (36.7 C) 98.1 F (36.7 C)  TempSrc: Oral Oral Oral Oral  SpO2: 96% 98% 96% 95%   No intake or output data in the 24 hours ending 10/14/17 1704 There were no vitals filed for this visit.  Examination:  General exam: Appears calm and comfortable  Respiratory system: rhonchi at bases  Cardiovascular system: S1 & S2 heard, RRR. No JVD, murmurs, rubs Gastrointestinal system: Abdomen is nondistended, soft, tender in upper abd quadrants  Central nervous system: Alert and oriented. No focal neurological deficits. Extremities: Symmetric 5 x 5 power. Skin: jaundiced   Data Reviewed: I have personally reviewed following labs and imaging studies  CBC: Recent Labs  Lab 10/13/17 1800 10/14/17 0404  WBC 5.7 5.5  HGB 13.4 12.9  HCT 37.3 36.7  MCV 85.0 86.6  PLT 248 832   Basic Metabolic Panel: Recent Labs  Lab 10/13/17 1800 10/14/17 0404  NA 129* 131*  K 4.2 4.3  CL 95* 96*  CO2 20* 22  GLUCOSE 102* 93  BUN 12 10  CREATININE 0.50 0.54  CALCIUM 9.1 8.9   Liver Function Tests: Recent Labs  Lab 10/13/17 1800 10/14/17 0404  AST 290* 275*  ALT 201* 192*  ALKPHOS 470* 419*  BILITOT 5.4* 4.7*  PROT 6.8 6.1*  ALBUMIN 3.2* 2.9*   Recent Labs  Lab 10/14/17 0404  LIPASE 333*   CBG: Recent Labs  Lab 10/09/17 1516  GLUCAP 95   Urine analysis:    Component Value Date/Time   COLORURINE YELLOW 05/24/2017 1021   APPEARANCEUR CLEAR 05/24/2017 1021   LABSPEC 1.011 05/24/2017 1021   PHURINE 6.0 05/24/2017 1021   GLUCOSEU NEGATIVE 05/24/2017 1021   HGBUR MODERATE (A) 05/24/2017 1021   BILIRUBINUR NEGATIVE 05/24/2017 [place] 05/24/2017 1021   PROTEINUR NEGATIVE 05/24/2017 1021   NITRITE NEGATIVE 05/24/2017 1021   [place] 05/24/2017 1021   Radiology Studies: No results found.   Scheduled Meds: Continuous Infusions: .  sodium chloride 125 mL/hr at 10/14/17 0930    LOS: 1 day   Time spent: 25 minutes   Faye Ramsay, MD [facility] Pager [phone]  If 7PM-7AM, please contact night-coverage [email or web address] Password [facility] 10/14/2017, 5:04 PM

## 2017-10-14 NOTE — Consult Note (Signed)
[facility] Consult  Referring Provider: Theodis Blaze, MD([facility]) Primary Care Physician:  Alycia Rossetti, MD Primary Gastroenterologist: Earle Gell, MD([facility])  Reason for Consultation:  Abnormal LFTs, abnormal PET scan, likely pancreatic malignancy with metastases  HPI: Haley [name] is a 67 y.o. 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  Patient states in October 2018 she experienced diarrhea and rectal bleeding and had a CAT scan of the abdomen which showed colitis involving descending colon and was treated with antibiotics, was discharge home from ED on Cipro and Flagyl and Zofran. She did not have a colonoscopy at that point as she had a colonoscopy on 10/25/2014 which showed tubular adenoma in ascending and descending colon.  Patient states she was in his usual state of health until December 2018 when she developed severe pain in lower back and bilateral hips and was being treated with gabapentin for arthritis. On 10/05/2017 she underwent a CT chest for a right chest pain, difficulty breathing and cough which showed lung mass consistent with malignancy and 3 liver lesion suspicious for metastatic disease. MRI lumbar spine from 10/06/2017 performed for pathologic fracture of L2 vertebra showed multiple hepatic metastases and left adrenal mass compatible with metastatic disease along with L2 compression deformity. Patient has lost about 19 pounds in the last 2 months, complains of decreased appetite, back pain, generalized abdominal pain, some nausea without vomiting. She herself did not notice yellowish discoloration of the skin, but was told that she looked jaundiced and also has dark urine for the last several days. She has  been a smoker all her life, one pack per day for at least 50 years.   Past Medical History:  Diagnosis Date  . Allergy   . Osteopenia     Past Surgical History:  Procedure Laterality Date  . COLONOSCOPY    . COLONOSCOPY WITH PROPOFOL N/A 10/25/2014   Procedure: COLONOSCOPY WITH PROPOFOL;  Surgeon: Garlan Fair, MD;  Location: [facility];  Service: Endoscopy;  Laterality: N/A;  . IR RADIOLOGIST EVAL & MGMT  10/02/2017    Prior to Admission medications   Medication Sig Start Date End Date Taking? Authorizing Provider  atorvastatin (LIPITOR) 40 MG tablet Take 1 tablet (40 mg total) by mouth daily. 08/21/17  Yes [place], Modena Nunnery, MD  ondansetron (ZOFRAN) 4 MG tablet Take 4 mg by mouth every 8 (eight) hours as needed for nausea or vomiting.    Yes [provider]  oxyCODONE (OXY IR/ROXICODONE) 5 MG immediate release tablet Take 1-2 tablets (5-10 mg total) by mouth every 4 (four) hours as needed for severe pain. 09/30/17  Yes Brunetta Genera, MD  senna-docusate (SENNA S) 8.6-50 MG tablet Take 2 tablets by mouth at bedtime. 09/30/17  Yes Brunetta Genera, MD  tiZANidine (ZANAFLEX) 4 MG tablet Take 1 tablet (4 mg total) by mouth every 8 (eight) hours as needed. Patient taking differently: Take 4 mg by mouth every 8 (eight) hours as needed for muscle spasms.  09/30/17  Yes Brunetta Genera, MD  acetaminophen (TYLENOL) 500 MG tablet Take 2 tablets (1,000 mg total) by mouth 2 (two) times daily. Patient not taking: Reported on 10/13/2017 09/30/17   Brunetta Genera, MD  albuterol (PROVENTIL HFA;VENTOLIN HFA) 108 [phone] Base) MCG/ACT inhaler Inhale 2 puffs into the lungs every 6 (  six) hours as needed for wheezing or shortness of breath. 07/30/17   Orlena Sheldon, PA-C  oxyCODONE-acetaminophen (PERCOCET/ROXICET) 5-325 MG tablet Take 1 tablet by mouth every 4 (four) hours as needed for severe pain. Patient not taking: Reported on 10/13/2017 10/05/17   Isla Pence, MD    Current  Facility-Administered Medications  Medication Dose Route Frequency Provider Last Rate Last Dose  . 0.9 %  sodium chloride infusion   Intravenous Continuous Theodis Blaze, MD 75 mL/hr at 10/14/17 0512    . albuterol (PROVENTIL) (2.5 MG/3ML) 0.083% nebulizer solution 2.5 mg  2.5 mg Nebulization Q6H PRN Theodis Blaze, MD      . enoxaparin (LOVENOX) injection 40 mg  40 mg Subcutaneous Q24H Theodis Blaze, MD   40 mg at 10/13/17 2205  . HYDROmorphone (DILAUDID) injection 1 mg  1 mg Intravenous Q4H PRN Theodis Blaze, MD   1 mg at 10/14/17 0509  . ondansetron (ZOFRAN) injection 4 mg  4 mg Intravenous Q6H PRN Theodis Blaze, MD      . promethazine (PHENERGAN) injection 12.5 mg  12.5 mg Intravenous Q6H PRN Theodis Blaze, MD      . traZODone (DESYREL) tablet 50 mg  50 mg Oral QHS PRN Theodis Blaze, MD   50 mg at 10/13/17 2205    Allergies as of 10/13/2017  . (No Known Allergies)    Family History  Problem Relation Age of Onset  . Diabetes Mother   . Hypertension Mother   . Diabetes Sister   . Hypertension Sister   . Hyperlipidemia Sister     Social History   Socioeconomic History  . Marital status: Single    Spouse name: Not on file  . Number of children: Not on file  . Years of education: Not on file  . Highest education level: Not on file  Social Needs  . Financial resource strain: Not on file  . Food insecurity - worry: Not on file  . Food insecurity - inability: Not on file  . Transportation needs - medical: Not on file  . Transportation needs - non-medical: Not on file  Occupational History  . Not on file  Tobacco Use  . Smoking status: Former Smoker    Packs/day: 1.00    Years: 40.00    Pack years: 40.00    Types: Cigarettes    Last attempt to quit: 09/24/2017    Years since quitting: 0.0  . Smokeless tobacco: Never Used  Substance and Sexual Activity  . Alcohol use: Yes    Comment: rare social  . Drug use: No  . Sexual activity: Not Currently  Other Topics  Concern  . Not on file  Social History Narrative  . Not on file    Review of Systems: Positive for: GI: Described in detail in HPI.    Gen: anorexia, fatigue, weakness, malaise, involuntary weight loss,Denies any fever, chills, rigors, night sweats,  and sleep disorder CV: Denies chest pain, angina, palpitations, syncope, orthopnea, PND, peripheral edema, and claudication. Resp: Denies dyspnea, cough, sputum, wheezing, coughing up blood. GU : Denies urinary burning, blood in urine, urinary frequency, urinary hesitancy, nocturnal urination, and urinary incontinence. MS:  back pain, hip pain, Denies swelling.  Denies muscle weakness, cramps, atrophy.  Derm: Denies rash, itching, oral ulcerations, hives, unhealing ulcers.  Psych: Denies depression, anxiety, memory loss, suicidal ideation, hallucinations,  and confusion. Heme: Denies bruising, bleeding, and enlarged lymph nodes. Neuro:  Denies any headaches, dizziness, paresthesias. Endo:  Denies any problems with DM, thyroid, adrenal function.  Physical Exam: Vital signs in last 24 hours: Temp:  [97.5 F (36.4 C)-98.7 F (37.1 C)] 98.1 F (36.7 C) (02/19 0449) Pulse Rate:  [48-93] 93 (02/19 0449) Resp:  [18] 18 (02/19 0449) BP: (94-126)/(49-73) 110/51 (02/19 0449) SpO2:  [96 %-98 %] 96 % (02/19 0449) Weight:  [53.2 kg (117 lb 3.2 oz)] 53.2 kg (117 lb 3.2 oz) (02/18 0915) Last BM Date: 10/10/17  General:   Alert,  Well-developed, well-nourished, pleasant and cooperative in NAD Head:  Normocephalic and atraumatic. Eyes: Mild icterus.   Conjunctiva pink. Ears:  Normal auditory acuity. Nose:  No deformity, discharge,  or lesions. Mouth:  No deformity or lesions.  Oropharynx pink & moist. Neck:  Supple; no masses or thyromegaly. Lungs:  Clear throughout to auscultation.   No wheezes, crackles, or rhonchi. No acute distress. Heart:  Regular rate and rhythm; no murmurs, clicks, rubs,  or gallops. Extremities:  Without clubbing or  edema. Neurologic:  Alert and  oriented x4;  grossly normal neurologically. Skin:  Intact without significant lesions or rashes. Psych:  Alert and cooperative. Normal mood and affect. Abdomen:  Soft, mild generalized tenderness and nondistended. No masses, hepatosplenomegaly or hernias noted. Normal bowel sounds, without guarding, and without rebound.         Lab Results: Recent Labs    10/13/17 1800 10/14/17 0404  WBC 5.7 5.5  HGB 13.4 12.9  HCT 37.3 36.7  PLT 248 218   BMET Recent Labs    10/13/17 1800 10/14/17 0404  NA 129* 131*  K 4.2 4.3  CL 95* 96*  CO2 20* 22  GLUCOSE 102* 93  BUN 12 10  CREATININE 0.50 0.54  CALCIUM 9.1 8.9   LFT Recent Labs    10/14/17 0404  PROT 6.1*  ALBUMIN 2.9*  AST 275*  ALT 192*  ALKPHOS 419*  BILITOT 4.7*   PT/INR No results for input(s): LABPROT, INR in the last 72 hours.  Studies/Results: No results found.  Impression: 1. 3.9 cm hypermetabolic mass in head of pancreas? 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 Infrahilar right lung cancer, with widespread metastases 3. L2 compression deformity 4. Elevated lipase of 333, mild abdominal pain, compatible with pancreatitis  Plan: 1. MRI with contrast and MRCP to better define pancreatic and biliary structure 2. CA-19-9 3. Clear liquid diet, nothing by mouth post midnight(May need additional procedure ERCP/EUS/IR guided liver biopsy) 4. Increase IV fluids to 125 mL per hour, continue Dilaudid 1 mg IV every 4 hours when necessary severe pain. 5. Oncology evaluation  Had a detailed discussion with patient regarding findings on PET scan, CAT scan of chest, MRI of lumbosacral spine. Patient understands she has metastatic disease, lesions in liver/left adrenal mass, with possible primary being pancreas or lung cancer.    LOS: 1 day   [name], M.D.  10/14/2017,  8:15 AM  Pager [phone] If no answer or after 5 PM call [phone]

## 2017-10-14 NOTE — Progress Notes (Signed)
Referring Physician(s): Myers,I  Supervising Physician: Marybelle Killings  Patient Status:  [facility] - In-pt  Chief Complaint:  Back /abdominal pain, nausea, poor appetite  Subjective: Patient familiar to IR service from recent consultation with Dr. Laurence Ferrari on 10/02/17 to discuss treatment options for symptomatic L2 pathologic compression fracture.  She was deemed an appropriate candidate for percutaneous cement augmentation, biopsy and potentially radiofrequency ablation of the L2 fracture; however since consultation patient has undergone further imaging revealing extensive metastatic disease with hypermetabolic lymphadenopathy in the thoracic inlet bilaterally, mediastinum, right hilum and right infrahilar regions.  In addition there are hypermetabolic liver metastases, diffuse bone metastasis and hypermetabolic mass in head of the pancreas as well as mild intra-and extrahepatic biliary duct dilatation.  Above procedure has since been canceled and request now received for liver lesion biopsy for further evaluation.  Patient currently denies fever, headache, chest pain, dyspnea, cough, nausea vomiting or bleeding.  She continues to have abdominal and back discomfort. Past Medical History:  Diagnosis Date  . Allergy   . Osteopenia    Past Surgical History:  Procedure Laterality Date  . COLONOSCOPY    . COLONOSCOPY WITH PROPOFOL N/A 10/25/2014   Procedure: COLONOSCOPY WITH PROPOFOL;  Surgeon: Garlan Fair, MD;  Location: [facility];  Service: Endoscopy;  Laterality: N/A;  . IR RADIOLOGIST EVAL & MGMT  10/02/2017      Allergies: Patient has no known allergies.  Medications: Prior to Admission medications   Medication Sig Start Date End Date Taking? Authorizing Provider  atorvastatin (LIPITOR) 40 MG tablet Take 1 tablet (40 mg total) by mouth daily. 08/21/17  Yes [place], Modena Nunnery, MD  ondansetron (ZOFRAN) 4 MG tablet Take 4 mg by mouth every 8 (eight) hours as needed for nausea or  vomiting.    Yes [provider]  oxyCODONE (OXY IR/ROXICODONE) 5 MG immediate release tablet Take 1-2 tablets (5-10 mg total) by mouth every 4 (four) hours as needed for severe pain. 09/30/17  Yes Brunetta Genera, MD  senna-docusate (SENNA S) 8.6-50 MG tablet Take 2 tablets by mouth at bedtime. 09/30/17  Yes Brunetta Genera, MD  tiZANidine (ZANAFLEX) 4 MG tablet Take 1 tablet (4 mg total) by mouth every 8 (eight) hours as needed. Patient taking differently: Take 4 mg by mouth every 8 (eight) hours as needed for muscle spasms.  09/30/17  Yes Brunetta Genera, MD  acetaminophen (TYLENOL) 500 MG tablet Take 2 tablets (1,000 mg total) by mouth 2 (two) times daily. Patient not taking: Reported on 10/13/2017 09/30/17   Brunetta Genera, MD  albuterol (PROVENTIL HFA;VENTOLIN HFA) 108 [phone] Base) MCG/ACT inhaler Inhale 2 puffs into the lungs every 6 (six) hours as needed for wheezing or shortness of breath. 07/30/17   Orlena Sheldon, PA-C  oxyCODONE-acetaminophen (PERCOCET/ROXICET) 5-325 MG tablet Take 1 tablet by mouth every 4 (four) hours as needed for severe pain. Patient not taking: Reported on 10/13/2017 10/05/17   Isla Pence, MD     Vital Signs: BP (!) 110/51 (BP Location: Right Arm)   Pulse 93   Temp 98.1 F (36.7 C) (Oral)   Resp 18   SpO2 96%   Physical Exam awake, alert.  Chest with few rhonchi at bases, heart with normal rate, occasional ectopy.  Abdomen soft, positive bowel sounds, mild generalized tenderness to palpation; L2 paravertebral tenderness to palpation.  No lower extremity edema.  Imaging: No results found.  Labs:  CBC: Recent Labs    10/01/17 1002 10/05/17  2131 10/13/17 1800 10/14/17 0404  WBC 5.9 7.1 5.7 5.5  HGB 14.0 14.3 13.4 12.9  HCT 39.3 39.4 37.3 36.7  PLT 202 248 248 218    COAGS: No results for input(s): INR, APTT in the last 8760 hours.  BMP: Recent Labs    10/01/17 1002 10/05/17 2131 10/13/17 1800 10/14/17 0404  NA 126*  127* 129* 131*  K 4.6 4.0 4.2 4.3  CL 93* 95* 95* 96*  CO2 23 22 20* 22  GLUCOSE 135 127* 102* 93  BUN 14 13 12 10   CALCIUM 9.7 9.4 9.1 8.9  CREATININE 0.80 0.82 0.50 0.54  GFRNONAA >60 >60 >60 >60  GFRAA >60 >60 >60 >60    LIVER FUNCTION TESTS: Recent Labs    05/24/17 0650 10/01/17 1002 10/13/17 1800 10/14/17 0404  BILITOT 0.7 0.6 5.4* 4.7*  AST 24 43* 290* 275*  ALT 14 16 201* 192*  ALKPHOS 71 107 470* 419*  PROT 6.6 7.0 6.8 6.1*  ALBUMIN 3.9 3.8 3.2* 2.9*    Assessment and Plan: Patient with history of symptomatic L2 pathologic compression fracture, recently admitted with abdominal/back pain, nausea, elevated LFTs and imaging findings of extensive metastatic disease with hypermetabolic lymphadenopathy in the thoracic inlet bilaterally, mediastinum, right hilum and right infrahilar regions.  In addition there are hypermetabolic liver metastases, diffuse bone metastasis and hypermetabolic mass in head of the pancreas as well as mild intra-and extrahepatic biliary duct dilatation.  Originally scheduled for L2 osteocool ablation/bx/cement augmentation following consultation with Dr. Laurence Ferrari on 10/02/17 but with latest findings procedure has been cancelled.  Request now received for image guided liver lesion biopsy for further evaluation.  Latest imaging studies have been reviewed by Dr. Annamaria Boots.  Plan at this time is to await findings of MRI abdomen with MRCP scheduled by GI service before proceeding with liver biopsy( d/w Dr. Therisa Doyne). Risks and benefits discussed with the patient including, but not limited to bleeding, infection, damage to adjacent structures or low yield requiring additional tests.  Current labs include WBC 5.5, hemoglobin 12.9, platelets 218k, creatinine 0.5 ,total bilirubin 4.7, PT/INR pending.  All of the patient's questions were answered, patient is agreeable to proceed. Consent signed and in chart.  We will continue to monitor and proceed with case tent on 2/20  if necessary.  We will plan to hold lovenox until after biopsy.   Electronically Signed: D. Rowe Robert, PA-C 10/14/2017, 1:36 PM   I spent a total of 25 minutes at the the patient's bedside AND on the patient's hospital floor or unit, greater than 50% of which was counseling/coordinating care for image guided liver lesion biopsy    Patient ID: Haley [name], female   DOB: 06-06-1951, 67 y.o.   MRN: [id number]

## 2017-10-14 NOTE — Progress Notes (Signed)
HEMATOLOGY/ONCOLOGY INPATIENT PROGRESS NOTE  Date of Service: 10/14/2017  Inpatient Attending: .Theodis Blaze, MD   SUBJECTIVE  Haley [name] is well known to me. I intially saw her as an outpatient for her Bone Marrow Abnormailty and pathologic vertebral fracture. Further workup was being completed. She has already her had lumbar MRI on 10/06/17 and PET scan on 10/09/17 which revealed findings concerning for metas She was admitted to [facility] for nausea, abdominal pain and poor oral intake on 10/13/17. She presents in the hospital today with her family member. She notes now her pain is reasonable controlled with oxycodone. She has already been seen by GI Dr. Therisa Doyne who plans to have MRI/MRCP and then ERCP to addres obstructive jaundice.. Dr. Jenetta Loges will see if a biopsy is possible of the pancreatic mass. If that is not possible will need to consider US guided biopsy of the mets in the liver .   REVIEW OF SYSTEMS:   On review of symptoms, pt notes she has not eaten much during hospital stay, mostly liquid diet and has not had a bowel movement yet.    .10 Point review of Systems was done is negative except as noted above.   OBJECTIVE: NAD   PHYSICAL EXAMINATION: . Vitals:   10/13/17 2046 10/14/17 0449 10/14/17 1347 10/14/17 2030  BP: (!) 117/49 (!) 110/51 122/61 123/81  Pulse: 88 93 90 79  Resp: 18 18 16 17   Temp: 98.7 F (37.1 C) 98.1 F (36.7 C) 98.1 F (36.7 C) 99.1 F (37.3 C)  TempSrc: Oral Oral Oral Oral  SpO2: 98% 96% 95% 94%  mild emotional distress. . GENERAL:alert, in no acute distress and comfortable SKIN: no acute rashes, no significant lesions EYES: icteric sclerae OROPHARYNX: MMM, no exudates, no oropharyngeal erythema or ulceration NECK: supple, no JVD LYMPH:  no palpable lymphadenopathy in the cervical, axillary or inguinal regions LUNGS: clear to auscultation b/l with normal respiratory effort HEART: regular rate & rhythm ABDOMEN:  normoactive bowel sounds ,  non tender, not distended. Extremity: no pedal edema PSYCH: alert & oriented x 3 with fluent speech NEURO: no focal motor/sensory deficits   MEDICAL HISTORY:  Past Medical History:  Diagnosis Date  . Allergy   . Osteopenia     SURGICAL HISTORY: Past Surgical History:  Procedure Laterality Date  . COLONOSCOPY    . COLONOSCOPY WITH PROPOFOL N/A 10/25/2014   Procedure: COLONOSCOPY WITH PROPOFOL;  Surgeon: Garlan Fair, MD;  Location: [facility];  Service: Endoscopy;  Laterality: N/A;  . IR RADIOLOGIST EVAL & MGMT  10/02/2017    SOCIAL HISTORY: Social History   Socioeconomic History  . Marital status: Single    Spouse name: Not on file  . Number of children: Not on file  . Years of education: Not on file  . Highest education level: Not on file  Social Needs  . Financial resource strain: Not on file  . Food insecurity - worry: Not on file  . Food insecurity - inability: Not on file  . Transportation needs - medical: Not on file  . Transportation needs - non-medical: Not on file  Occupational History  . Not on file  Tobacco Use  . Smoking status: Former Smoker    Packs/day: 1.00    Years: 40.00    Pack years: 40.00    Types: Cigarettes    Last attempt to quit: 09/24/2017    Years since quitting: 0.0  . Smokeless tobacco: Never Used  Substance and Sexual Activity  .  Alcohol use: Yes    Comment: rare social  . Drug use: No  . Sexual activity: Not Currently  Other Topics Concern  . Not on file  Social History Narrative  . Not on file    FAMILY HISTORY: Family History  Problem Relation Age of Onset  . Diabetes Mother   . Hypertension Mother   . Diabetes Sister   . Hypertension Sister   . Hyperlipidemia Sister     ALLERGIES:  has No Known Allergies.  MEDICATIONS:  Scheduled Meds: Continuous Infusions: . sodium chloride 125 mL/hr at 10/14/17 0930   PRN Meds:.albuterol, HYDROmorphone (DILAUDID) injection, ondansetron (ZOFRAN) IV, promethazine,  traZODone  LABORATORY DATA:  I have reviewed the data as listed . CBC Latest Ref Rng & Units 10/14/2017 10/13/2017 10/05/2017  WBC 4.0 - 10.5 K/uL 5.5 5.7 7.1  Hemoglobin 12.0 - 15.0 g/dL 12.9 13.4 14.3  Hematocrit 36.0 - 46.0 % 36.7 37.3 39.4  Platelets 150 - 400 K/uL 218 248 248    . CMP Latest Ref Rng & Units 10/14/2017 10/13/2017 10/05/2017  Glucose 65 - 99 mg/dL 93 102(H) 127(H)  BUN 6 - 20 mg/dL 10 12 13   Creatinine 0.44 - 1.00 mg/dL 0.54 0.50 0.82  Sodium 135 - 145 mmol/L 131(L) 129(L) 127(L)  Potassium 3.5 - 5.1 mmol/L 4.3 4.2 4.0  Chloride 101 - 111 mmol/L 96(L) 95(L) 95(L)  CO2 22 - 32 mmol/L 22 20(L) 22  Calcium 8.9 - 10.3 mg/dL 8.9 9.1 9.4  Total Protein 6.5 - 8.1 g/dL 6.1(L) 6.8 -  Total Bilirubin 0.3 - 1.2 mg/dL 4.7(H) 5.4(H) -  Alkaline Phos 38 - 126 U/L 419(H) 470(H) -  AST 15 - 41 U/L 275(H) 290(H) -  ALT 14 - 54 U/L 192(H) 201(H) -     RADIOGRAPHIC STUDIES: I have personally reviewed the radiological images as listed and agreed with the findings in the report. Dg Chest 2 View  Result Date: 10/05/2017 CLINICAL DATA:  Chest pain and shortness of breath EXAM: CHEST  2 VIEW COMPARISON:  Lumbar MRI September 24, 2017 FINDINGS: There is consolidation in the medial right base. Lungs elsewhere are clear. Heart size and pulmonary vascularity are normal. No adenopathy. No pneumothorax. There is a burst type fracture of the L2 vertebral body, present on recent MR. IMPRESSION: Consolidation medial right base. Lungs elsewhere clear. No adenopathy. Burst type fracture L2 vertebral body, present on prior MR. Electronically Signed   By: Lowella Grip III M.D.   On: 10/05/2017 21:56   Ct Angio Chest Pe W And/or Wo Contrast  Result Date: 10/05/2017 CLINICAL DATA:  Pt c/o upper R chest pain, difficulty breathing, and cough. She has been experiencing lower back pain over the last few weeks and was dx'd with a crushed vertebrae and possibly lung cancer. EXAM: CT ANGIOGRAPHY CHEST WITH  CONTRAST TECHNIQUE: Multidetector CT imaging of the chest was performed using the standard protocol during bolus administration of intravenous contrast. Multiplanar CT image reconstructions and MIPs were obtained to evaluate the vascular anatomy. CONTRAST:  <See Chart> ISOVUE-370 IOPAMIDOL (ISOVUE-370) INJECTION 76% COMPARISON:  Current chest radiographs FINDINGS: Cardiovascular: Satisfactory opacification of the pulmonary arteries to the segmental level. No evidence of pulmonary embolism. Normal heart size. No pericardial effusion. There are mild, 3 vessel coronary artery calcifications. Aorta is normal in caliber. No dissection. Mild atherosclerotic disease noted along the arch and descending thoracic aorta. Mediastinum/Nodes: No neck base or axillary masses or adenopathy. Visualized thyroid is in appearance. Borderline enlarged right paratracheal, azygos  level lymph node measuring 11 mm short axis. There is abnormal soft tissue that extends along the inferior right hilum and subcarinal mediastinum to surround the bronchus intermedius in occludes the more distal bronchus intermedius and right middle lobe bronchus. Lungs/Pleura: Complete atelectasis of the right lower lobe. With near complete atelectasis of the right middle lobe. Moderate to advanced centrilobular emphysema. No discrete lung masses or nodules. No evidence of pneumonia or pulmonary edema. Small right pleural effusion. No pneumothorax. Upper Abdomen: Low-density liver lesions, 3 total seen, along the right lobe, measuring from 16 mm to 20 mm. Left adrenal mass measuring 16 mm. No acute findings in the upper abdomen. Musculoskeletal: No fracture or acute finding. No osteoblastic or osteolytic lesions. Review of the MIP images confirms the above findings. IMPRESSION: 1. No evidence of a pulmonary embolism. 2. Abnormal soft tissue, consistent with malignancy, surrounds the inferior right hilar structures and occludes the bronchus intermedius leading to  complete atelectasis of the right lower lobe and significant atelectasis of the right middle lobe. There is a single borderline enlarged right paratracheal lymph node measuring 11 mm in short axis. 3. Three liver lesions are seen suspicious for metastatic disease. Enlarged left adrenal gland is also suspicious for metastatic disease. 4. No convincing pneumonia or evidence of pulmonary edema. 5. Moderate to advanced emphysema. Aortic Atherosclerosis (ICD10-I70.0) and Emphysema (ICD10-J43.9). Electronically Signed   By: Lajean Manes M.D.   On: 10/05/2017 23:01   Mr Lumbar Spine W Wo Contrast  Result Date: 10/06/2017 CLINICAL DATA:  67 y/o F; bone metastasis with pathologic fracture of L2 vertebral body. Numbness and tingling beginning to radiate down her legs. EXAM: MRI LUMBAR SPINE WITHOUT AND WITH CONTRAST TECHNIQUE: Multiplanar and multiecho pulse sequences of the lumbar spine were obtained without and with intravenous contrast. CONTRAST:  45m MULTIHANCE GADOBENATE DIMEGLUMINE 529 MG/ML IV SOLN COMPARISON:  09/24/2017 MRI of the lumbar spine. 10/05/2017 CT of the chest. FINDINGS: Segmentation:  Standard. Alignment:  Physiologic. Vertebrae: Diffuse low T1 signal with mild enhancement throughout the visible lumbar spine and sacrum patchy areas of fatty marrow sparing. Stable moderate compression deformity of L2 vertebral body with 7 mm retropulsion of superior endplate. Stable mild irregular enhancement of the L2 vertebral body. No new compression deformity. No epidural disease. Conus medullaris and cauda equina: Conus extends to the L1 level. Conus and cauda equina appear normal. Paraspinal and other soft tissues: Numerous masses throughout the liver measuring up to 23 mm in segment 2 and ill-defined nodule in left adrenal gland, likely metastatic disease. Disc levels: L1-2: Retropulsion of L2 superior endplate results in stable moderate canal stenosis. No foraminal stenosis. L2-3: No significant disc  displacement, foraminal stenosis, or canal stenosis. L3-4: Small disc bulge with mild facet and ligamentum flavum hypertrophy. No significant foraminal or canal stenosis. L4-5: Small disc bulge with moderate facet hypertrophy. No significant foraminal or canal stenosis. L5-S1: Disc bulge with endplate marginal osteophytes eccentric to the left and mild bilateral facet hypertrophy. Moderate left foraminal stenosis. No canal stenosis. IMPRESSION: 1. Stable L2 compression deformity with 7 mm retropulsion superior endplate resulting in moderate canal stenosis. 2. No abnormal enhancement of the visible cord or cauda equina. No new fracture. No epidural metastatic disease. 3. Diffuse decreased T1 signal throughout the visible spine and sacrum compatible with a fatty marrow replacement process which may be red marrow, neoplasm, or chemotherapy related response. 4. Stable lumbar spondylosis greatest at L5-S1 level where there is moderate left foraminal stenosis. 5. Multiple hepatic masses and  left adrenal mass compatible with metastatic disease. Electronically Signed   By: Kristine Garbe M.D.   On: 10/06/2017 23:47   Nm Pet Image Initial (pi) Skull Base To Thigh  Result Date: 10/09/2017 CLINICAL DATA:  Initial treatment strategy for metastatic disease of unknown primary. EXAM: NUCLEAR MEDICINE PET SKULL BASE TO THIGH TECHNIQUE: 6.3 mCi F-18 FDG was injected intravenously. Full-ring PET imaging was performed from the skull base to thigh after the radiotracer. CT data was obtained and used for attenuation correction and anatomic localization. FASTING BLOOD GLUCOSE:  Value: 95 mg/dl COMPARISON:  CT chest 10/05/2017.  Abdomen and pelvis CT 04/23/2017. FINDINGS: NECK: No hypermetabolic lymph nodes in the neck. CHEST: Mediastinal lymphadenopathy seen on recent CT chest is hypermetabolic. The subcarinal lymph node is measures an index with SUV max = 9.1. Mass lesion in the inferior right hilum is hypermetabolic with  SUV max = 9.9. Hypermetabolic lymph nodes are seen in the thoracic inlet bilaterally, in the paratracheal space, and in the right hilum. Tiny right pleural effusion noted. Emphysema noted in the lungs bilaterally. ABDOMEN/PELVIS: Multiple hypermetabolic liver metastases are evident. 3.0 cm lesion identified in the subcapsular liver on image 101 demonstrates SUV max = 8.3. Other scattered hypermetabolic liver metastases are evident. 2.4 cm lesion lateral segment left liver on image 118 demonstrates SUV max = 8.8. 3.6 x 3.9 cm soft tissue mass likely in the anterior head of pancreas is hypermetabolic with SUV max = 8.1. Layering high attenuation in the gallbladder lumen likely vicarious excretion of contrast from recent abdomen and pelvis CT as no gallstones were evident on 05/24/2017. Also possible that this represents sludge or interval development of innumerable tiny mineralized gallstones. Mild intra and extrahepatic biliary duct dilatation is evident. Extrahepatic common duct measures 9 mm diameter and the common bile duct appears obliterated at the level of the above-described pancreatic head lesion. There is abdominal aortic atherosclerosis without aneurysm. SKELETON: Multiple hypermetabolic bone metastases are evident. Hypermetabolic lesion in the left humeral head demonstrates SUV max = 6.5. Multiple hypermetabolic metastases are seen in the spine. L3 lesion demonstrates SUV max = 8.7. Hypermetabolic lesion in the left acetabulum demonstrates SUV max = 7.5 left femoral neck lesion demonstrates SUV max = 8.5. IMPRESSION: 1. Hypermetabolic lymphadenopathy is identified in the thoracic inlet bilaterally, mediastinum, and right hilum with associated hypermetabolic disease in the right infrahilar region. Additionally, hypermetabolic liver metastases, diffuse bone metastases and a 3.9 cm hypermetabolic mass in the head of the pancreas are evident. Imaging findings may well be related to primary pancreatic  adenocarcinoma with widespread metastatic involvement. Infrahilar right lung cancer with metastatic disease (including to the pancreas) is also a consideration. No pancreatic mass lesion was visible on the CT scan from 05/24/2017, even in retrospect. There is associated mild intra and extrahepatic biliary duct dilatation. EUS and/or abdominal MRI/MRCP may prove helpful to further evaluate. 2.  Aortic Atherosclerois (ICD10-170.0) 3.  Emphysema. ([id number]-S49.9) Electronically Signed   By: Misty Stanley M.D.   On: 10/09/2017 17:22   Ir Radiologist Eval & Mgmt  Result Date: 10/02/2017 Please refer to notes tab for details about interventional procedure. (Op Note)  Mr Outside Films Spine  Result Date: 10/01/2017 This examination belongs to an outside facility and is stored here for comparison purposes only.  Contact the originating outside institution for any associated report or interpretation.   ASSESSMENT & PLAN:   OLLA DELANCEY is a 67 y.o. caucasian female with    FABIANNA KEATS is a  67 y.o. caucasian female with    1. Newly diagnosed metastatic malignancy - concerning for metastatic carcinoma of lung primary vs pancreatic primary. Given speed of progression, pattern of involvement - concern to r/o small cell lung cancer.  2. L2 Compression Fracture - appears pathological on MRI  Concern for metastatic malignancy with extensive bone involvement. SPEP - unrevealing . 3. 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 -EUS guided biopsy of pancreatic mass vs US  guided biopsy of liver met for tissue diagnosis. -CA 19-9 levels pending -further oncologic recommendations -pending tissue diagnosis. -if small cell lung cancer will need to expedite her palliative treatment.  I spent 15 minutes counseling the patient face to face. The total time spent in the appointment was 20 minutes and more than 50% was on counseling and direct patient cares.    Sullivan Lone MD [place] AAHIVMS [facility] Hematology/Oncology Physician [facility]  (Office):       [phone] (Work cell):  [phone] (Fax):           [phone]  10/14/2017 2:10 PM  This document serves as a record of services personally performed by Sullivan Lone, MD. It was created on his behalf by Joslyn Devon, a trained medical scribe. The creation of this record is based on the scribe's personal observations and the provider's statements to them.    .I have reviewed the above documentation for accuracy and completeness, and I agree with the above. Sullivan Lone MD MS

## 2017-10-15 ENCOUNTER — Inpatient Hospital Stay (HOSPITAL_COMMUNITY): Payer: No Typology Code available for payment source

## 2017-10-15 ENCOUNTER — Encounter (HOSPITAL_COMMUNITY): Payer: Self-pay

## 2017-10-15 ENCOUNTER — Encounter (HOSPITAL_COMMUNITY)
Admission: AD | Disposition: A | Discharge status: Home / Self Care | Payer: Self-pay | Source: Ambulatory Visit | Attending: Internal Medicine

## 2017-10-15 ENCOUNTER — Inpatient Hospital Stay (HOSPITAL_COMMUNITY): Payer: No Typology Code available for payment source | Admitting: Certified Registered Nurse Anesthetist

## 2017-10-15 DIAGNOSIS — C801 Malignant (primary) neoplasm, unspecified: Secondary | ICD-10-CM

## 2017-10-15 DIAGNOSIS — R945 Abnormal results of liver function studies: Secondary | ICD-10-CM

## 2017-10-15 DIAGNOSIS — K831 Obstruction of bile duct: Principal | ICD-10-CM

## 2017-10-15 DIAGNOSIS — C7989 Secondary malignant neoplasm of other specified sites: Secondary | ICD-10-CM

## 2017-10-15 HISTORY — PX: ERCP: SHX5425

## 2017-10-15 LAB — CBC WITH DIFFERENTIAL/PLATELET
BASOS ABS: 0 10*3/uL (ref 0.0–0.1)
BASOS PCT: 0 %
Eosinophils Absolute: 0.1 10*3/uL (ref 0.0–0.7)
Eosinophils Relative: 2 %
HEMATOCRIT: 37.3 % (ref 36.0–46.0)
HEMOGLOBIN: 13.1 g/dL (ref 12.0–15.0)
Lymphocytes Relative: 24 %
Lymphs Abs: 1.3 10*3/uL (ref 0.7–4.0)
MCH: 30.5 pg (ref 26.0–34.0)
MCHC: 35.1 g/dL (ref 30.0–36.0)
MCV: 86.9 fL (ref 78.0–100.0)
MONO ABS: 0.7 10*3/uL (ref 0.1–1.0)
Monocytes Relative: 12 %
NEUTROS ABS: 3.3 10*3/uL (ref 1.7–7.7)
NEUTROS PCT: 62 %
Platelets: 209 10*3/uL (ref 150–400)
RBC: 4.29 MIL/uL (ref 3.87–5.11)
RDW: 13.1 % (ref 11.5–15.5)
WBC: 5.3 10*3/uL (ref 4.0–10.5)

## 2017-10-15 LAB — CANCER ANTIGEN 19-9: CA 19-9: 310 U/mL — ABNORMAL HIGH (ref 0–35)

## 2017-10-15 LAB — COMPREHENSIVE METABOLIC PANEL
ALT: 194 U/L — AB (ref 14–54)
AST: 280 U/L — ABNORMAL HIGH (ref 15–41)
Albumin: 2.9 g/dL — ABNORMAL LOW (ref 3.5–5.0)
Alkaline Phosphatase: 493 U/L — ABNORMAL HIGH (ref 38–126)
Anion gap: 14 (ref 5–15)
BUN: 5 mg/dL — ABNORMAL LOW (ref 6–20)
CHLORIDE: 99 mmol/L — AB (ref 101–111)
CO2: 22 mmol/L (ref 22–32)
CREATININE: 0.61 mg/dL (ref 0.44–1.00)
Calcium: 9.2 mg/dL (ref 8.9–10.3)
GFR calc non Af Amer: >60 mL/min (ref >60)
Glucose, Bld: 93 mg/dL (ref 65–99)
Potassium: 4.3 mmol/L (ref 3.5–5.1)
SODIUM: 135 mmol/L (ref 135–145)
Total Bilirubin: 5.2 mg/dL — ABNORMAL HIGH (ref 0.3–1.2)
Total Protein: 6.4 g/dL — ABNORMAL LOW (ref 6.5–8.1)

## 2017-10-15 LAB — PROTIME-INR
INR: 1.09
Prothrombin Time: 14.1 seconds (ref 11.4–15.2)

## 2017-10-15 SURGERY — ERCP, WITH INTERVENTION IF INDICATED
Anesthesia: General

## 2017-10-15 MED ORDER — CIPROFLOXACIN IN D5W 400 MG/200ML IV SOLN
INTRAVENOUS | Status: DC | PRN
  Administered 2017-10-15: 400 mg via INTRAVENOUS

## 2017-10-15 MED ORDER — ONDANSETRON HCL 4 MG/2ML IJ SOLN
INTRAMUSCULAR | Status: DC | PRN
  Administered 2017-10-15: 4 mg via INTRAVENOUS

## 2017-10-15 MED ORDER — MIDAZOLAM HCL 2 MG/2ML IJ SOLN
INTRAMUSCULAR | Status: AC
  Filled 2017-10-15: qty 2

## 2017-10-15 MED ORDER — INDOMETHACIN 50 MG RE SUPP
RECTAL | Status: AC
  Filled 2017-10-15: qty 2

## 2017-10-15 MED ORDER — LACTATED RINGERS IV SOLN
INTRAVENOUS | Status: DC | PRN
  Administered 2017-10-15: 11:00:00 via INTRAVENOUS

## 2017-10-15 MED ORDER — DEXAMETHASONE SODIUM PHOSPHATE 10 MG/ML IJ SOLN
INTRAMUSCULAR | Status: DC | PRN
  Administered 2017-10-15: 10 mg via INTRAVENOUS

## 2017-10-15 MED ORDER — PROPOFOL 10 MG/ML IV BOLUS
INTRAVENOUS | Status: AC
  Filled 2017-10-15: qty 40

## 2017-10-15 MED ORDER — FENTANYL CITRATE (PF) 100 MCG/2ML IJ SOLN
INTRAMUSCULAR | Status: DC | PRN
  Administered 2017-10-15: 25 ug via INTRAVENOUS
  Administered 2017-10-15: 50 ug via INTRAVENOUS
  Administered 2017-10-15: 25 ug via INTRAVENOUS

## 2017-10-15 MED ORDER — CIPROFLOXACIN IN D5W 400 MG/200ML IV SOLN
INTRAVENOUS | Status: AC
  Filled 2017-10-15: qty 200

## 2017-10-15 MED ORDER — MIDAZOLAM HCL 5 MG/5ML IJ SOLN
INTRAMUSCULAR | Status: DC | PRN
  Administered 2017-10-15: 1 mg via INTRAVENOUS

## 2017-10-15 MED ORDER — PHENYLEPHRINE 40 MCG/ML (10ML) SYRINGE FOR IV PUSH (FOR BLOOD PRESSURE SUPPORT)
PREFILLED_SYRINGE | INTRAVENOUS | Status: DC | PRN
  Administered 2017-10-15 (×10): 80 ug via INTRAVENOUS

## 2017-10-15 MED ORDER — GLUCAGON HCL RDNA (DIAGNOSTIC) 1 MG IJ SOLR
INTRAMUSCULAR | Status: AC
  Filled 2017-10-15: qty 1

## 2017-10-15 MED ORDER — LIDOCAINE 2% (20 MG/ML) 5 ML SYRINGE
INTRAMUSCULAR | Status: DC | PRN
  Administered 2017-10-15: 80 mg via INTRAVENOUS

## 2017-10-15 MED ORDER — SUGAMMADEX SODIUM 200 MG/2ML IV SOLN
INTRAVENOUS | Status: DC | PRN
  Administered 2017-10-15: 100 mg via INTRAVENOUS

## 2017-10-15 MED ORDER — PROPOFOL 10 MG/ML IV BOLUS
INTRAVENOUS | Status: DC | PRN
  Administered 2017-10-15: 140 mg via INTRAVENOUS

## 2017-10-15 MED ORDER — GADOBENATE DIMEGLUMINE 529 MG/ML IV SOLN
10.0000 mL | Freq: Once | INTRAVENOUS | Status: AC | PRN
  Administered 2017-10-15: 9 mL via INTRAVENOUS

## 2017-10-15 MED ORDER — FENTANYL CITRATE (PF) 100 MCG/2ML IJ SOLN
INTRAMUSCULAR | Status: AC
  Filled 2017-10-15: qty 2

## 2017-10-15 MED ORDER — ROCURONIUM BROMIDE 10 MG/ML (PF) SYRINGE
PREFILLED_SYRINGE | INTRAVENOUS | Status: DC | PRN
  Administered 2017-10-15: 30 mg via INTRAVENOUS

## 2017-10-15 MED ORDER — SODIUM CHLORIDE 0.9 % IV SOLN
1.5000 g | Freq: Four times a day (QID) | INTRAVENOUS | Status: DC
  Administered 2017-10-15 – 2017-10-18 (×12): 1.5 g via INTRAVENOUS
  Filled 2017-10-15 (×13): qty 1.5

## 2017-10-15 NOTE — Progress Notes (Signed)
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  She was hospitalized due to progressively worsening abdominal pain and abnormal LFT's.  Reason for Visit: Jaundice  Consultants: Gastroenterology.  Interventional radiology.  Oncology.  Procedures: None yet  Antibiotics: None  Subjective/Interval History: Patient continues to have pain in the upper abdomen.  Denies any nausea or vomiting.  She underwent MRCP this morning and waiting on results.  ROS: Denies any chest pain or shortness of breath.  Objective:  Vital Signs  Vitals:   10/15/17 0342 10/15/17 1020 10/15/17 1316 10/15/17 1321  BP: 128/79 128/80 (!) 164/97 (!) 138/98  Pulse: 86 91 99 95  Resp: 19 18 17 14   Temp: 98.7 F (37.1 C) 97.7 F (36.5 C)    TempSrc: Oral Oral    SpO2: 91% 99% 100% 100%  Weight:  53.1 kg (117 lb)    Height:  5\' 3"  (1.6 m)      Intake/Output Summary (Last 24 hours) at 10/15/2017 1335 Last data filed at 10/15/2017 1317 Gross per 24 hour  Intake 3380.25 ml  Output 960 ml  Net 2420.25 ml   Filed Weights   10/14/17 2030 10/15/17 1020  Weight: 53.2 kg (117 lb 4.6 oz) 53.1 kg (117 lb)    General appearance: alert, cooperative, appears stated age and no distress Head: Normocephalic, without obvious abnormality, atraumatic Resp: clear to auscultation bilaterally Cardio: regular rate and rhythm, S1, S2 normal, no murmur, click, rub or gallop GI: Abdomen is soft.  Mildly tender in the right upper quadrant without any rebound rigidity or guarding.  No masses organomegaly.  Bowel sounds are present.   Extremities: extremities normal, atraumatic, no cyanosis or edema Pulses: 2+ and  symmetric Neurologic: No obvious focal deficits.  Lab Results:  Data Reviewed: I have personally reviewed following labs and imaging studies  CBC: Recent Labs  Lab 10/13/17 1800 10/14/17 0404 10/15/17 0400  WBC 5.7 5.5 5.3  NEUTROABS  --   --  3.3  HGB 13.4 12.9 13.1  HCT 37.3 36.7 37.3  MCV 85.0 86.6 86.9  PLT 248 218 741    Basic Metabolic Panel: Recent Labs  Lab 10/13/17 1800 10/14/17 0404 10/15/17 0400  NA 129* 131* 135  K 4.2 4.3 4.3  CL 95* 96* 99*  CO2 20* 22 22  GLUCOSE 102* 93 93  BUN 12 10 5*  CREATININE 0.50 0.54 0.61  CALCIUM 9.1 8.9 9.2    GFR: Estimated Creatinine Clearance: 58 mL/min (by C-G formula based on SCr of 0.61 mg/dL).  Liver Function Tests: Recent Labs  Lab 10/13/17 1800 10/14/17 0404 10/15/17 0400  AST 290* 275* 280*  ALT 201* 192* 194*  ALKPHOS 470* 419* 493*  BILITOT 5.4* 4.7* 5.2*  PROT 6.8 6.1* 6.4*  ALBUMIN 3.2* 2.9* 2.9*    Recent Labs  Lab 10/14/17 0404  LIPASE 333*    Coagulation Profile: Recent Labs  Lab 10/15/17 0400  INR 1.09    CBG: Recent Labs  Lab 10/09/17 1516  GLUCAP 95     Radiology Studies: Mr 3d Recon At Scanner  Result Date: 10/15/2017 CLINICAL DATA:  Metastatic carcinoma of uncertain primary. Abnormal liver function tests and jaundice. EXAM: MRI ABDOMEN WITHOUT AND WITH CONTRAST (INCLUDING MRCP) TECHNIQUE: Multiplanar multisequence  MR imaging of the abdomen was performed both before and after the administration of intravenous contrast. Heavily T2-weighted images of the biliary and pancreatic ducts were obtained, and three-dimensional MRCP images were rendered by post processing. CONTRAST:  71mL MULTIHANCE GADOBENATE DIMEGLUMINE 529 MG/ML IV SOLN COMPARISON:  PET-CT dated 10/09/2017 and CT abdomen from 05/24/2017 FINDINGS: Lower chest: Airspace opacity and some volume loss in the right lower lobe is again observed with a suspected right infrahilar mass only partially seen today. Borderline  cardiomegaly. Small right and trace left pleural effusions. Hepatobiliary: Moderate intrahepatic biliary dilatation with CBD dilatation up to 1.3 cm in diameter, and abrupt truncation of the common bile duct in the vicinity of the pancreatic head mass. There are approximately 20 hypoenhancing masses scattered in the liver. One lesion in segment 3 of the liver measures 2.7 by 2.0 cm, previously 2.6 by 2.0 cm on 10/09/2017 by my measurement. There is mild gallbladder wall thickening with accentuated precontrast T1 signal in the gallbladder favoring sludge. Pancreas: Hypoenhancing mass in the head of the pancreas measures proximally 3.4 by 3.1 cm on image 62/11105. Mildly dilated dorsal pancreatic duct extending to the margin of this mass as shown on image 25/401. Trace peripancreatic edema. Spleen:  Unremarkable.  Trace perisplenic ascites. Adrenals/Urinary Tract: 1.1 by 1.8 cm left adrenal mass was previously hypermetabolic, and probably represents a metastatic lesion. The kidneys appear unremarkable. Trace right greater than left perirenal stranding. Stomach/Bowel: No dilated bowel identified. Vascular/Lymphatic:  Aortoiliac atherosclerotic vascular disease. Other:  No supplemental non-categorized findings. Musculoskeletal: Scattered widespread osseous metastatic lesions including the sternum, ribs, spine, and bony pelvis. L2 compression fracture with associated enhancement, and possibly acute component of fracture. IMPRESSION: 1. 3.4 cm pancreatic head mass is obstructing the common bile duct, which measures up to 1.3 cm in diameter. There is also moderate intrahepatic biliary dilatation. The mass also causes truncation of the dorsal pancreatic duct which is mildly dilated. 2. Approximately 20 metastatic lesions are identified in the liver. 3. Right lower lobe airspace opacity and atelectasis. Suspected right infrahilar mass. 4. Small right and trace left pleural effusions. 5. Small left adrenal mass is suspicious  for a metastatic lesion. 6. Scattered widespread osseous metastatic disease. 7. Other imaging findings of potential clinical significance: Aortic Atherosclerosis (ICD10-I70.0). L2 compression fracture as shown on prior MRI. Mild upper abdominal ascites with some trace edema along the mesentery and along several organs. Suspected gallbladder sludge with borderline distended gallbladder. Electronically Signed   By: Van Clines M.D.   On: 10/15/2017 09:55   Mr Abdomen Mrcp Moise Boring Contast  Result Date: 10/15/2017 CLINICAL DATA:  Metastatic carcinoma of uncertain primary. Abnormal liver function tests and jaundice. EXAM: MRI ABDOMEN WITHOUT AND WITH CONTRAST (INCLUDING MRCP) TECHNIQUE: Multiplanar multisequence MR imaging of the abdomen was performed both before and after the administration of intravenous contrast. Heavily T2-weighted images of the biliary and pancreatic ducts were obtained, and three-dimensional MRCP images were rendered by post processing. CONTRAST:  4mL MULTIHANCE GADOBENATE DIMEGLUMINE 529 MG/ML IV SOLN COMPARISON:  PET-CT dated 10/09/2017 and CT abdomen from 05/24/2017 FINDINGS: Lower chest: Airspace opacity and some volume loss in the right lower lobe is again observed with a suspected right infrahilar mass only partially seen today. Borderline cardiomegaly. Small right and trace left pleural effusions. Hepatobiliary: Moderate intrahepatic biliary dilatation with CBD dilatation up to 1.3 cm in diameter, and abrupt truncation of the common bile duct in the vicinity of the pancreatic head mass. There are approximately 20 hypoenhancing masses  scattered in the liver. One lesion in segment 3 of the liver measures 2.7 by 2.0 cm, previously 2.6 by 2.0 cm on 10/09/2017 by my measurement. There is mild gallbladder wall thickening with accentuated precontrast T1 signal in the gallbladder favoring sludge. Pancreas: Hypoenhancing mass in the head of the pancreas measures proximally 3.4 by 3.1 cm on  image [id number]/11105. Mildly dilated dorsal pancreatic duct extending to the margin of this mass as shown on image 25/401. Trace peripancreatic edema. Spleen:  Unremarkable.  Trace perisplenic ascites. Adrenals/Urinary Tract: 1.1 by 1.8 cm left adrenal mass was previously hypermetabolic, and probably represents a metastatic lesion. The kidneys appear unremarkable. Trace right greater than left perirenal stranding. Stomach/Bowel: No dilated bowel identified. Vascular/Lymphatic:  Aortoiliac atherosclerotic vascular disease. Other:  No supplemental non-categorized findings. Musculoskeletal: Scattered widespread osseous metastatic lesions including the sternum, ribs, spine, and bony pelvis. L2 compression fracture with associated enhancement, and possibly acute component of fracture. IMPRESSION: 1. 3.4 cm pancreatic head mass is obstructing the common bile duct, which measures up to 1.3 cm in diameter. There is also moderate intrahepatic biliary dilatation. The mass also causes truncation of the dorsal pancreatic duct which is mildly dilated. 2. Approximately 20 metastatic lesions are identified in the liver. 3. Right lower lobe airspace opacity and atelectasis. Suspected right infrahilar mass. 4. Small right and trace left pleural effusions. 5. Small left adrenal mass is suspicious for a metastatic lesion. 6. Scattered widespread osseous metastatic disease. 7. Other imaging findings of potential clinical significance: Aortic Atherosclerosis (ICD10-I70.0). L2 compression fracture as shown on prior MRI. Mild upper abdominal ascites with some trace edema along the mesentery and along several organs. Suspected gallbladder sludge with borderline distended gallbladder. Electronically Signed   By: Van Clines M.D.   On: 10/15/2017 09:55     Medications:  Scheduled:  Continuous: . sodium chloride 125 mL/hr at 10/14/17 2120   PRN:[MAR Hold] albuterol, [MAR Hold]  HYDROmorphone (DILAUDID) injection, [MAR Hold]  ondansetron (ZOFRAN) IV, [MAR Hold] promethazine, [MAR Hold] traZODone  Assessment/Plan:  Active Problems:   Jaundice   Malignant obstructive jaundice (HCC)   Metastatic malignant neoplasm (HCC)    Abdominal pain with Jaundice/Abnormal LFTs Imaging studies done so for raise concern for pancreatic head lesion.  MRCP done this morning shows obstructive pancreatic head mass.  Liver lesions concerning for metastases also noted.  Gastroenterology is following.  Patient will likely need ERCP and stent placement.  Pain control.  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  Will need biopsy.  Gastroenterology following.  Oncology also following.  Once definite diagnosis is available she will need further evaluation for treatment options.  L2 compression fracture Likely pathological.  Hyponatremia Has improved.  History of COPD Stable.   DVT Prophylaxis: Lovenox on hold for procedure.    Code Status: Full code Family Communication: No family at bedside.  Discussed with patient Disposition Plan: Management as outlined above.    LOS: 2 days   [facility] Pager [phone] 10/15/2017, 1:35 PM  If 7PM-7AM, please contact night-coverage at [email or web address], password [facility]

## 2017-10-15 NOTE — Progress Notes (Signed)
ERCP unsuccessful please see dictation for details she may have had a retroperitoneal perforation by the wire and some dye and will observe closely for signs of complications and begin antibiotics and a pancreatic duct stent was placed to decrease chance of pancreatitis and will need an x-ray next week to confirm that passes and we will follow with you but would hold off on liver biopsy until stable and will keep nothing by mouth today but if okay in a.m. may begin clear liquids

## 2017-10-15 NOTE — Anesthesia Preprocedure Evaluation (Signed)
Anesthesia Evaluation  Patient identified by MRN, date of birth, ID band Patient awake    Airway Mallampati: II  TM Distance: >3 FB Neck ROM: Full    Dental no notable dental hx.    Pulmonary COPD, former smoker,    Pulmonary exam normal        Cardiovascular negative cardio ROS   Rhythm:Regular Rate:Normal     Neuro/Psych    GI/Hepatic negative GI ROS, Neg liver ROS,   Endo/Other  negative endocrine ROS  Renal/GU negative Renal ROS     Musculoskeletal   Abdominal Normal abdominal exam  (+)   Peds negative pediatric ROS (+)  Hematology   Anesthesia Other Findings   Reproductive/Obstetrics                             Anesthesia Physical Anesthesia Plan  ASA: III  Anesthesia Plan: General   Post-op Pain Management:    Induction: Intravenous  PONV Risk Score and Plan:   Airway Management Planned: Oral ETT  Additional Equipment:   Intra-op Plan:   Post-operative Plan: Extubation in OR  Informed Consent: I have reviewed the patients History and Physical, chart, labs and discussed the procedure including the risks, benefits and alternatives for the proposed anesthesia with the patient or authorized representative who has indicated his/her understanding and acceptance.   Dental advisory given  Plan Discussed with: CRNA and Anesthesiologist  Anesthesia Plan Comments:         Anesthesia Quick Evaluation

## 2017-10-15 NOTE — Transfer of Care (Signed)
Immediate Anesthesia Transfer of Care Note  Patient: Haley [name]  Procedure(s) Performed: ENDOSCOPIC RETROGRADE CHOLANGIOPANCREATOGRAPHY (ERCP) (N/A )  Patient Location: PACU and Endoscopy Unit  Anesthesia Type:General  Level of Consciousness: awake and responds to stimulation  Airway & Oxygen Therapy: Patient Spontanous Breathing and Patient connected to face mask oxygen  Post-op Assessment: Report given to RN and Post -op Vital signs reviewed and stable  Post vital signs: Reviewed and stable  Last Vitals:  Vitals:   10/15/17 0342 10/15/17 1020  BP: 128/79 128/80  Pulse: 86 91  Resp: 19 18  Temp: 37.1 C 36.5 C  SpO2: 91% 99%    Last Pain:  Vitals:   10/15/17 1020  TempSrc: Oral  PainSc:          Complications: No apparent anesthesia complications

## 2017-10-15 NOTE — Progress Notes (Signed)
Haley [name] 11:21 AM  Subjective: Patient with out any current complaints and her case discussed with Dr. 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

## 2017-10-15 NOTE — Anesthesia Postprocedure Evaluation (Signed)
Anesthesia Post Note  Patient: Haley [name]  Procedure(s) Performed: ENDOSCOPIC RETROGRADE CHOLANGIOPANCREATOGRAPHY (ERCP) (N/A )     Patient location during evaluation: Endoscopy Anesthesia Type: General Level of consciousness: awake and alert Pain management: pain level controlled Vital Signs Assessment: post-procedure vital signs reviewed and stable Respiratory status: spontaneous breathing, nonlabored ventilation, respiratory function stable and patient connected to nasal cannula oxygen Cardiovascular status: blood pressure returned to baseline and stable Postop Assessment: no apparent nausea or vomiting Anesthetic complications: no    Last Vitals:  Vitals:   10/15/17 1353 10/15/17 1400  BP: 140/65 (!) 157/85  Pulse: 94 91  Resp: 19 16  Temp:    SpO2: 95% 97%    Last Pain:  Vitals:   10/15/17 1340  TempSrc: Oral  PainSc:                  [place]et [place]

## 2017-10-15 NOTE — Op Note (Signed)
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 Pascal Lux Estimated Blood Loss:     Estimated blood loss: none. Procedure:                Pre-Anesthesia Assessment:                           - Prior to the procedure, a History and Physical                            was performed, and patient medications and                            allergies were reviewed. The patient's tolerance of                            previous anesthesia was also reviewed. The risks                            and benefits of the procedure and the sedation                            options and risks were discussed with the patient.                            All questions were answered, and informed consent  was obtained. Prior Anticoagulants: The patient has                            taken no previous anticoagulant or antiplatelet                            agents. ASA Grade Assessment: II - A patient with                            mild systemic disease. After reviewing the risks                            and benefits, the patient was deemed in   satisfactory condition to undergo the procedure.                           After obtaining informed consent, the scope was                            passed under direct vision. Throughout the                            procedure, the patient's blood pressure, pulse, and                            oxygen saturations were monitored continuously. The                            [id number]) scope was introduced through                            the mouth, and used to inject contrast into and                            used to inject contrast into the bile duct and                            ventral pancreatic duct. The ERCP was technically                            difficult and complex due to extrinsic compression.                            Successful completion of the procedure was aided by                            performing the maneuvers documented (below) in this                            report. The patient tolerated the procedure well. Scope In: Scope Out: Findings:      The major papilla was bulging. A wire was passed into what we thought       was the biliary tree fairly readily but we were unable  to advance the       sphincterotome into the duct and on injecting dye there was some       pancreatic filling but because the wire seemed to go in the direction of       the CBD we proceeded with a Biliary sphincterotomy which was made with a       Hydratome sphincterotome using ERBE electrocautery. There was no       post-sphincterotomy bleeding. Unfortunately we could still not get deep       selective cannulation and we tried to advance the dilating catheter over       this wire could not get in deep and we elected to try the smaller       sphincterotome and the smaller wire which on first attempt passed into       the pancreatic duct so we elected to place One 4 Fr by 5 cm plastic       stent with a single external pigtail and a single internal flap was       placed 4 cm  into the ventral pancreatic duct this did drain the residual       contrast from the pancreatic duct. The stent was in good position. We       then tried to cannulate next to this stent and we tried to follow the       wire previously placed and although again we thought we advanced it into       the bile duct we could not get deep selective cannulation so we       increased the sphincterotomy a little bit without obvious complication       and were able to advance the sphincterotome a little further but with       dye injection no obvious CBD filling was seen instead it seems to be       retroperitoneal and this was confirmed by radiology as below and we       elected to remove the initial wire when we were unable to advance the       balloon catheter over it to possibly get better injections with the       occlusion technique and for a short time we retried to cannulate next to       the stent under fluoroscopy and endoscopic guidance without any success       and the wire was advanced either towards the pancreas or into 1 of the       false channels and after a prolonged effort we elected to stop the       procedure at this point the patient seemingly tolerated the procedure       well wire sphincterotome and scope was removed and there was       complication as above Impression:               - The major papilla appeared to be bulging.                           - A biliary sphincterotomy was performed x2.                           - One plastic stent was placed into the ventral  pancreatic duct. False channel created with                            probable retroperitoneal dye injection and wire                            advancement Moderate Sedation:      N/A- Per Anesthesia Care Recommendation:           - NPO today. If doing well tomorrow may have clear                            liquids                           - Continue present medications. Will add  antibiotics                           - Return to GI clinic PRN. When stable will need                            liver biopsy and consideration of PTC versus                            retrial next week and will follow with you                           - Telephone GI clinic if symptomatic PRN. Procedure Code(s):        --- Professional ---                           [phone], Endoscopic retrograde                            cholangiopancreatography (ERCP); with placement of                            endoscopic stent into biliary or pancreatic duct,                            including pre- and post-dilation and guide wire                            passage, when performed, including sphincterotomy,                            when performed, each stent                           [id number], 59, Endoscopic retrograde                            cholangiopancreatography (ERCP); with                            sphincterotomy/papillotomy Diagnosis Code(s):        ---  Professional ---                           K83.9, Disease of biliary tract, unspecified                           R17, Unspecified jaundice                           C25.0, Malignant neoplasm of head of pancreas                           K83.8, Other specified diseases of biliary tract CPT copyright 2016 [employer]. All rights reserved. The codes documented in this report are preliminary and upon coder review may  be revised to meet current compliance requirements. Clarene Essex, MD 10/15/2017 1:22:46 PM This report has been signed electronically. Number of Addenda: 0

## 2017-10-15 NOTE — Anesthesia Procedure Notes (Signed)
Procedure Name: Intubation Date/Time: 10/15/2017 11:30 AM Performed by: Lavina Hamman, CRNA Pre-anesthesia Checklist: Patient identified, Emergency Drugs available, Suction available, Patient being monitored and Timeout performed Patient Re-evaluated:Patient Re-evaluated prior to induction Oxygen Delivery Method: Circle system utilized Preoxygenation: Pre-oxygenation with 100% oxygen Induction Type: IV induction Ventilation: Mask ventilation without difficulty Laryngoscope Size: Mac and 4 Grade View: Grade II Tube type: Oral Tube size: 7.0 mm Number of attempts: 1 Airway Equipment and Method: Stylet Placement Confirmation: ETT inserted through vocal cords under direct vision,  positive ETCO2,  CO2 detector and breath sounds checked- equal and bilateral Secured at: 21 cm Tube secured with: Tape Dental Injury: Teeth and Oropharynx as per pre-operative assessment

## 2017-10-16 ENCOUNTER — Encounter (HOSPITAL_COMMUNITY): Payer: Self-pay | Admitting: Gastroenterology

## 2017-10-16 DIAGNOSIS — C799 Secondary malignant neoplasm of unspecified site: Secondary | ICD-10-CM

## 2017-10-16 LAB — CBC WITH DIFFERENTIAL/PLATELET
BASOS PCT: 0 %
Basophils Absolute: 0 10*3/uL (ref 0.0–0.1)
EOS ABS: 0 10*3/uL (ref 0.0–0.7)
EOS PCT: 0 %
HCT: 36.1 % (ref 36.0–46.0)
Hemoglobin: 12.4 g/dL (ref 12.0–15.0)
Lymphocytes Relative: 17 %
Lymphs Abs: 0.9 10*3/uL (ref 0.7–4.0)
MCH: 29.9 pg (ref 26.0–34.0)
MCHC: 34.3 g/dL (ref 30.0–36.0)
MCV: 87 fL (ref 78.0–100.0)
MONO ABS: 0.6 10*3/uL (ref 0.1–1.0)
MONOS PCT: 12 %
NEUTROS PCT: 71 %
Neutro Abs: 3.6 10*3/uL (ref 1.7–7.7)
PLATELETS: 224 10*3/uL (ref 150–400)
RBC: 4.15 MIL/uL (ref 3.87–5.11)
RDW: 13.1 % (ref 11.5–15.5)
WBC: 5.2 10*3/uL (ref 4.0–10.5)

## 2017-10-16 LAB — COMPREHENSIVE METABOLIC PANEL
ALK PHOS: 434 U/L — AB (ref 38–126)
ALT: 165 U/L — AB (ref 14–54)
AST: 223 U/L — ABNORMAL HIGH (ref 15–41)
Albumin: 2.7 g/dL — ABNORMAL LOW (ref 3.5–5.0)
Anion gap: 16 — ABNORMAL HIGH (ref 5–15)
BUN: 7 mg/dL (ref 6–20)
CO2: 19 mmol/L — AB (ref 22–32)
CREATININE: 0.53 mg/dL (ref 0.44–1.00)
Calcium: 8.9 mg/dL (ref 8.9–10.3)
Chloride: 99 mmol/L — ABNORMAL LOW (ref 101–111)
Glucose, Bld: 111 mg/dL — ABNORMAL HIGH (ref 65–99)
Potassium: 4 mmol/L (ref 3.5–5.1)
Sodium: 134 mmol/L — ABNORMAL LOW (ref 135–145)
Total Bilirubin: 5.5 mg/dL — ABNORMAL HIGH (ref 0.3–1.2)
Total Protein: 5.9 g/dL — ABNORMAL LOW (ref 6.5–8.1)

## 2017-10-16 NOTE — Progress Notes (Signed)
Subjective: No pain. Is hungry.  Objective: Vital signs in last 24 hours: Temp:  [97.9 F (36.6 C)-99.2 F (37.3 C)] 97.9 F (36.6 C) (02/21 1521) Pulse Rate:  [84-88] 84 (02/21 1521) Resp:  [12-18] 18 (02/21 1521) BP: (109-117)/(68-81) 109/76 (02/21 1521) SpO2:  [93 %] 93 % (02/21 1521) Weight:  [115 lb 4.8 oz (52.3 kg)] 115 lb 4.8 oz (52.3 kg) (02/21 1300) Weight change: -4.6 oz (-0.129 kg) Last BM Date: 10/15/17  PE: GEN:  Thin, cachectic, jaundiced, but NAD ABD:  Soft, non-tender  Lab Results: CBC    Component Value Date/Time   WBC 5.2 10/16/2017 0348   RBC 4.15 10/16/2017 0348   HGB 12.4 10/16/2017 0348   HCT 36.1 10/16/2017 0348   PLT 224 10/16/2017 0348   MCV 87.0 10/16/2017 0348   MCH 29.9 10/16/2017 0348   MCHC 34.3 10/16/2017 0348   RDW 13.1 10/16/2017 0348   LYMPHSABS 0.9 10/16/2017 0348   MONOABS 0.6 10/16/2017 0348   EOSABS 0.0 10/16/2017 0348   BASOSABS 0.0 10/16/2017 0348   CMP     Component Value Date/Time   NA 134 (L) 10/16/2017 0348   K 4.0 10/16/2017 0348   CL 99 (L) 10/16/2017 0348   CO2 19 (L) 10/16/2017 0348   GLUCOSE 111 (H) 10/16/2017 0348   BUN 7 10/16/2017 0348   CREATININE 0.53 10/16/2017 0348   CREATININE 0.80 10/01/2017 1002   CREATININE 0.80 05/12/2017 1110   CALCIUM 8.9 10/16/2017 0348   PROT 5.9 (L) 10/16/2017 0348   ALBUMIN 2.7 (L) 10/16/2017 0348   AST 223 (H) 10/16/2017 0348   AST 43 (H) 10/01/2017 1002   ALT 165 (H) 10/16/2017 0348   ALT 16 10/01/2017 1002   ALKPHOS 434 (H) 10/16/2017 0348   BILITOT 5.5 (H) 10/16/2017 0348   BILITOT 0.6 10/01/2017 1002   GFRNONAA >60 10/16/2017 0348   GFRNONAA >60 10/01/2017 1002   GFRNONAA 81 05/03/2016 0827   GFRAA >60 10/16/2017 0348   GFRAA >60 10/01/2017 1002   GFRAA >89 05/03/2016 0827   Assessment:  1.  Obstructive jaundice.  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 2.  Elevated LFTs.  Likely both liver lesions and bile duct  obstruction.  Plan:  1.  Patient's history and exam today are benign, and no evidence of peritonitis, mindful of concern for possible retroperitoneal perforation yesterday. 2.  Advise continued antibiotics for now. 3.  Will start clear liquid diet. 4.  Discussed with Dr. Maryland Pink and Irene Limbo.  Dr. Irene Limbo will reach out to Interventional Radiology to see if we can get U/S-guided liver biopsy tomorrow, to rule out small cell lung cancer, and I don't think this procedure is contraindicated as patient's exam is not suggestive of peritonitis. 5.  Post biopsy diagnosis, consideration for PTC (doubtful feasibility given multiple liver metastases) versus retry ERCP at tertiary center next week.  I discussed possibility of EUS-guided biopsy of pancreas at time of ERCP next week, but Dr. Irene Limbo may prefer the biopsy be expedited since if the lesions are small cell cancer in etiology this would need to be treated more expeditiously. 6.  [facility] will follow.   Landry Dyke 10/16/2017, 4:00 PM   Cell [phone] If no answer or after 5 PM call [phone]

## 2017-10-16 NOTE — Progress Notes (Signed)
ERCP attempted today. Unable to stent CBD and no tissue obtained at this time. Further oncologic recommendations pending tissue diagnosis.  Sullivan Lone MD MS(oncology)

## 2017-10-16 NOTE — Progress Notes (Signed)
Initial Nutrition Assessment  INTERVENTION:   Diet advancement per MD  NUTRITION DIAGNOSIS:   Inadequate oral intake related to poor appetite, nausea as evidenced by per patient/family report.  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  Pt currently NPO (now x 3 days). Pt reports nausea and poor appetite PTA. Pt with liver and bone mets with unknown primary carcinoma. Per chart review, ERCP was attempted but was unsuccessful.  Per chart, pt has lost 6 lb since 2/5 (5% wt loss x 2 weeks, significant for time frame).   Will monitor for plan once diagnosis is confirmed.  Medications reviewed. Labs reviewed: Low Na   NUTRITION - FOCUSED PHYSICAL EXAM:  Will attempt at follow-up.  Diet Order:  Diet NPO time specified  EDUCATION NEEDS:   Not appropriate for education at this time  Skin:  Skin Assessment: Reviewed RN Assessment  Last BM:  2/20  Height:   Ht Readings from Last 1 Encounters:  10/15/17 5\' 3"  (1.6 m)    Weight:   Wt Readings from Last 1 Encounters:  10/16/17 115 lb 4.8 oz (52.3 kg)    Ideal Body Weight:  52.3 kg  BMI:  Body mass index is 20.42 kg/m.  Estimated Nutritional Needs:   Kcal:  7741-2878  Protein:  60-70g  Fluid:  1.6L/day  Clayton Bibles, MS, RD, LDN [facility] Pager: [phone] After Hours Pager: [phone]

## 2017-10-16 NOTE — Progress Notes (Signed)
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  She was hospitalized due to progressively worsening abdominal pain and abnormal LFT's.  Reason for Visit: Jaundice  Consultants: Gastroenterology.  Interventional radiology.  Oncology.  Procedures:  ERCP 2/20 Impression:                - The major papilla appeared to be bulging. - A biliary sphincterotomy was performed x2. - One plastic stent was placed into the ventral  pancreatic duct. False channel created with probable retroperitoneal dye injection and wire advancement   Antibiotics: None  Subjective/Interval History: Patient disappointed that the ERCP and stent placement was unsuccessful yesterday.  She continues to have abdominal discomfort but denies any nausea or vomiting.    ROS: Denies any chest pain or shortness of breath.  Objective:  Vital Signs  Vitals:   10/15/17 1400 10/15/17 1550 10/16/17 0003 10/16/17 0637  BP: (!) 157/85 139/85 117/81 112/68  Pulse: 91 86 88 87  Resp: 16 17 12 16   Temp:  97.6 F (36.4 C) 98.2 F (36.8 C) 99.2 F (37.3 C)  TempSrc:  Oral Oral Oral  SpO2: 97% 95% 93% 93%  Weight:      Height:        Intake/Output Summary (Last 24 hours) at 10/16/2017 0837 Last data filed at 10/16/2017 6283 Gross per 24 hour  Intake 1454 ml  Output 2510 ml  Net -1056 ml   Filed Weights   10/14/17 2030 10/15/17 1020  Weight: 53.2 kg (117 lb 4.6 oz) 53.1 kg (117 lb)    General appearance: Awake and alert.  In no distress. Resp: clear to auscultation bilaterally Cardio: regular rate and rhythm, S1, S2 normal, no murmur, click, rub or gallop GI: Abdomen is soft.  Mildly tender in the right upper quadrant  without any rebound rigidity or guarding.  No masses organomegaly.  Bowel sounds are present.   Extremities: extremities normal, atraumatic, no cyanosis or edema Neurologic: No obvious focal deficits.  Lab Results:  Data Reviewed: I have personally reviewed following labs and imaging studies  CBC: Recent Labs  Lab 10/13/17 1800 10/14/17 0404 10/15/17 0400 10/16/17 0348  WBC 5.7 5.5 5.3 5.2  NEUTROABS  --   --  3.3 3.6  HGB 13.4 12.9 13.1 12.4  HCT 37.3 36.7 37.3 36.1  MCV 85.0 86.6 86.9 87.0  PLT 248 218 209 662    Basic Metabolic Panel: Recent Labs  Lab 10/13/17 1800 10/14/17 0404 10/15/17 0400 10/16/17 0348  NA 129* 131* 135 134*  K 4.2 4.3 4.3 4.0  CL 95* 96* 99* 99*  CO2 20* 22 22 19*  GLUCOSE 102* 93 93 111*  BUN 12 10 5* 7  CREATININE 0.50 0.54 0.61 0.53  CALCIUM 9.1 8.9 9.2 8.9    GFR: Estimated Creatinine Clearance: 58 mL/min (by C-G formula based on SCr of 0.53 mg/dL).  Liver Function Tests: Recent Labs  Lab 10/13/17 1800 10/14/17 0404 10/15/17 0400 10/16/17 0348  AST 290* 275* 280* 223*  ALT 201* 192* 194* 165*  ALKPHOS 470* 419* 493* 434*  BILITOT 5.4* 4.7* 5.2* 5.5*  PROT 6.8 6.1* 6.4* 5.9*  ALBUMIN 3.2* 2.9* 2.9* 2.7*    Recent Labs  Lab 10/14/17 0404  LIPASE 333*  Coagulation Profile: Recent Labs  Lab 10/15/17 0400  INR 1.09    CBG: Recent Labs  Lab 10/09/17 [place]     Radiology Studies: Mr 3d Recon At Scanner  Result Date: 10/15/2017 CLINICAL DATA:  Metastatic carcinoma of uncertain primary. Abnormal liver function tests and jaundice. EXAM: MRI ABDOMEN WITHOUT AND WITH CONTRAST (INCLUDING MRCP) TECHNIQUE: Multiplanar multisequence MR imaging of the abdomen was performed both before and after the administration of intravenous contrast. Heavily T2-weighted images of the biliary and pancreatic ducts were obtained, and three-dimensional MRCP images were rendered by post processing. CONTRAST:  25mL MULTIHANCE  GADOBENATE DIMEGLUMINE 529 MG/ML IV SOLN COMPARISON:  PET-CT dated 10/09/2017 and CT abdomen from 05/24/2017 FINDINGS: Lower chest: Airspace opacity and some volume loss in the right lower lobe is again observed with a suspected right infrahilar mass only partially seen today. Borderline cardiomegaly. Small right and trace left pleural effusions. Hepatobiliary: Moderate intrahepatic biliary dilatation with CBD dilatation up to 1.3 cm in diameter, and abrupt truncation of the common bile duct in the vicinity of the pancreatic head mass. There are approximately 20 hypoenhancing masses scattered in the liver. One lesion in segment 3 of the liver measures 2.7 by 2.0 cm, previously 2.6 by 2.0 cm on 10/09/2017 by my measurement. There is mild gallbladder wall thickening with accentuated precontrast T1 signal in the gallbladder favoring sludge. Pancreas: Hypoenhancing mass in the head of the pancreas measures proximally 3.4 by 3.1 cm on image 62/11105. Mildly dilated dorsal pancreatic duct extending to the margin of this mass as shown on image 25/401. Trace peripancreatic edema. Spleen:  Unremarkable.  Trace perisplenic ascites. Adrenals/Urinary Tract: 1.1 by 1.8 cm left adrenal mass was previously hypermetabolic, and probably represents a metastatic lesion. The kidneys appear unremarkable. Trace right greater than left perirenal stranding. Stomach/Bowel: No dilated bowel identified. Vascular/Lymphatic:  Aortoiliac atherosclerotic vascular disease. Other:  No supplemental non-categorized findings. Musculoskeletal: Scattered widespread osseous metastatic lesions including the sternum, ribs, spine, and bony pelvis. L2 compression fracture with associated enhancement, and possibly acute component of fracture. IMPRESSION: 1. 3.4 cm pancreatic head mass is obstructing the common bile duct, which measures up to 1.3 cm in diameter. There is also moderate intrahepatic biliary dilatation. The mass also causes truncation of the  dorsal pancreatic duct which is mildly dilated. 2. Approximately 20 metastatic lesions are identified in the liver. 3. Right lower lobe airspace opacity and atelectasis. Suspected right infrahilar mass. 4. Small right and trace left pleural effusions. 5. Small left adrenal mass is suspicious for a metastatic lesion. 6. Scattered widespread osseous metastatic disease. 7. Other imaging findings of potential clinical significance: Aortic Atherosclerosis (ICD10-I70.0). L2 compression fracture as shown on prior MRI. Mild upper abdominal ascites with some trace edema along the mesentery and along several organs. Suspected gallbladder sludge with borderline distended gallbladder. Electronically Signed   By: Van Clines M.D.   On: 10/15/2017 09:55   Dg Ercp Biliary & Pancreatic Ducts  Result Date: 10/15/2017 CLINICAL DATA:  Unsuccessful attempted ERCP EXAM: ERCP TECHNIQUE: Multiple spot images obtained with the fluoroscopic device and submitted for interpretation post-procedure. COMPARISON:  MRCP - 10/15/2017 FLUOROSCOPY TIME:  25 minutes, 45 seconds (175.39 mGy) FINDINGS: There are 4 spot intraoperative fluoroscopic images of the right upper abdominal quadrant provided for review Initial image demonstrates an ERCP probe overlying the right upper abdominal quadrant. There is an additional thin drainage catheter overlying the expected location of the pancreatic duct. Subsequent images demonstrate contrast injection without definitive opacification of  a recognizable feature of the biliary system. IMPRESSION: 1. Attempted ERCP as above. 2. Additional thin drainage catheter overlies expected location of the pancreatic duct. Above discussed with Dr. Watt Climes prior to completion of the procedure Electronically Signed   By: Sandi Mariscal M.D.   On: 10/15/2017 14:08   Mr Abdomen Mrcp Moise Boring Contast  Result Date: 10/15/2017 CLINICAL DATA:  Metastatic carcinoma of uncertain primary. Abnormal liver function tests and jaundice.  EXAM: MRI ABDOMEN WITHOUT AND WITH CONTRAST (INCLUDING MRCP) TECHNIQUE: Multiplanar multisequence MR imaging of the abdomen was performed both before and after the administration of intravenous contrast. Heavily T2-weighted images of the biliary and pancreatic ducts were obtained, and three-dimensional MRCP images were rendered by post processing. CONTRAST:  44mL MULTIHANCE GADOBENATE DIMEGLUMINE 529 MG/ML IV SOLN COMPARISON:  PET-CT dated 10/09/2017 and CT abdomen from 05/24/2017 FINDINGS: Lower chest: Airspace opacity and some volume loss in the right lower lobe is again observed with a suspected right infrahilar mass only partially seen today. Borderline cardiomegaly. Small right and trace left pleural effusions. Hepatobiliary: Moderate intrahepatic biliary dilatation with CBD dilatation up to 1.3 cm in diameter, and abrupt truncation of the common bile duct in the vicinity of the pancreatic head mass. There are approximately 20 hypoenhancing masses scattered in the liver. One lesion in segment 3 of the liver measures 2.7 by 2.0 cm, previously 2.6 by 2.0 cm on 10/09/2017 by my measurement. There is mild gallbladder wall thickening with accentuated precontrast T1 signal in the gallbladder favoring sludge. Pancreas: Hypoenhancing mass in the head of the pancreas measures proximally 3.4 by 3.1 cm on image 62/11105. Mildly dilated dorsal pancreatic duct extending to the margin of this mass as shown on image 25/401. Trace peripancreatic edema. Spleen:  Unremarkable.  Trace perisplenic ascites. Adrenals/Urinary Tract: 1.1 by 1.8 cm left adrenal mass was previously hypermetabolic, and probably represents a metastatic lesion. The kidneys appear unremarkable. Trace right greater than left perirenal stranding. Stomach/Bowel: No dilated bowel identified. Vascular/Lymphatic:  Aortoiliac atherosclerotic vascular disease. Other:  No supplemental non-categorized findings. Musculoskeletal: Scattered widespread osseous metastatic  lesions including the sternum, ribs, spine, and bony pelvis. L2 compression fracture with associated enhancement, and possibly acute component of fracture. IMPRESSION: 1. 3.4 cm pancreatic head mass is obstructing the common bile duct, which measures up to 1.3 cm in diameter. There is also moderate intrahepatic biliary dilatation. The mass also causes truncation of the dorsal pancreatic duct which is mildly dilated. 2. Approximately 20 metastatic lesions are identified in the liver. 3. Right lower lobe airspace opacity and atelectasis. Suspected right infrahilar mass. 4. Small right and trace left pleural effusions. 5. Small left adrenal mass is suspicious for a metastatic lesion. 6. Scattered widespread osseous metastatic disease. 7. Other imaging findings of potential clinical significance: Aortic Atherosclerosis (ICD10-I70.0). L2 compression fracture as shown on prior MRI. Mild upper abdominal ascites with some trace edema along the mesentery and along several organs. Suspected gallbladder sludge with borderline distended gallbladder. Electronically Signed   By: Van Clines M.D.   On: 10/15/2017 09:55     Medications:  Scheduled:  Continuous: . sodium chloride 75 mL/hr at 10/16/17 0653  . ampicillin-sulbactam (UNASYN) IV Stopped (10/16/17 1610)   RUE:[id number], HYDROmorphone (DILAUDID) injection, ondansetron (ZOFRAN) IV, promethazine, traZODone  Assessment/Plan:  Active Problems:   Jaundice   Malignant obstructive jaundice (HCC)   Metastatic malignant neoplasm (HCC)    Abdominal pain with obstructive jaundice/Abnormal LFTs Imaging studies done so for raise concern for pancreatic head lesion.  MRCP  showed obstructive pancreatic head mass.  Liver lesions concerning for metastases also noted.  Gastroenterology is following.  Patient underwent ERCP 2/20 however biliary stent could not be placed despite multiple attempts.  A stent was placed in the pancreatic duct.  Pain control.  Patient  started on antibiotics by gastroenterology due to possibility of retroperitoneal extravasation while undergoing ERCP yesterday.  Further management per gastroenterology.  Discussed with Dr. Paulita Fujita today.   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  Will need biopsy.  Ultrasound-guided liver biopsy has been ordered.  Oncology following along with gastroenterology. Once definite diagnosis is available she will need further evaluation for treatment options.  L2 compression fracture Likely pathological.  Not very symptomatic.  No neurological deficits.  Hyponatremia Has improved.  History of COPD Stable.   DVT Prophylaxis: SCD's Code Status: Full code Family Communication: No family at bedside.  Discussed with patient Disposition Plan: Management as outlined above.  Continue to mobilize    LOS: 3 days   [facility] Pager [phone] 10/16/2017, 8:37 AM  If 7PM-7AM, please contact night-coverage at [email or web address], password [facility]

## 2017-10-17 ENCOUNTER — Inpatient Hospital Stay (HOSPITAL_COMMUNITY): Payer: No Typology Code available for payment source

## 2017-10-17 DIAGNOSIS — C3490 Malignant neoplasm of unspecified part of unspecified bronchus or lung: Secondary | ICD-10-CM

## 2017-10-17 DIAGNOSIS — C787 Secondary malignant neoplasm of liver and intrahepatic bile duct: Secondary | ICD-10-CM

## 2017-10-17 HISTORY — DX: Malignant neoplasm of unspecified part of unspecified bronchus or lung: C34.90

## 2017-10-17 LAB — COMPREHENSIVE METABOLIC PANEL
ALT: 155 U/L — ABNORMAL HIGH (ref 14–54)
AST: 223 U/L — ABNORMAL HIGH (ref 15–41)
Albumin: 2.6 g/dL — ABNORMAL LOW (ref 3.5–5.0)
Alkaline Phosphatase: 416 U/L — ABNORMAL HIGH (ref 38–126)
Anion gap: 15 (ref 5–15)
BUN: 8 mg/dL (ref 6–20)
CHLORIDE: 99 mmol/L — AB (ref 101–111)
CO2: 21 mmol/L — AB (ref 22–32)
Calcium: 8.7 mg/dL — ABNORMAL LOW (ref 8.9–10.3)
Creatinine, Ser: 0.54 mg/dL (ref 0.44–1.00)
GFR calc non Af Amer: >60 mL/min (ref >60)
Glucose, Bld: 83 mg/dL (ref 65–99)
POTASSIUM: 3.5 mmol/L (ref 3.5–5.1)
SODIUM: 135 mmol/L (ref 135–145)
Total Bilirubin: 5 mg/dL — ABNORMAL HIGH (ref 0.3–1.2)
Total Protein: 5.8 g/dL — ABNORMAL LOW (ref 6.5–8.1)

## 2017-10-17 LAB — CBC
HCT: 35 % — ABNORMAL LOW (ref 36.0–46.0)
Hemoglobin: 12 g/dL (ref 12.0–15.0)
MCH: 29.9 pg (ref 26.0–34.0)
MCHC: 34.3 g/dL (ref 30.0–36.0)
MCV: 87.3 fL (ref 78.0–100.0)
PLATELETS: 219 10*3/uL (ref 150–400)
RBC: 4.01 MIL/uL (ref 3.87–5.11)
RDW: 13.3 % (ref 11.5–15.5)
WBC: 5.6 10*3/uL (ref 4.0–10.5)

## 2017-10-17 MED ORDER — FENTANYL CITRATE (PF) 100 MCG/2ML IJ SOLN
INTRAMUSCULAR | Status: AC | PRN
  Administered 2017-10-17: 50 ug via INTRAVENOUS

## 2017-10-17 MED ORDER — MIDAZOLAM HCL 2 MG/2ML IJ SOLN
INTRAMUSCULAR | Status: AC
  Filled 2017-10-17: qty 2

## 2017-10-17 MED ORDER — FENTANYL CITRATE (PF) 100 MCG/2ML IJ SOLN
INTRAMUSCULAR | Status: AC
  Filled 2017-10-17: qty 2

## 2017-10-17 MED ORDER — LIDOCAINE HCL 1 % IJ SOLN
INTRAMUSCULAR | Status: AC
  Filled 2017-10-17: qty 10

## 2017-10-17 MED ORDER — GELATIN ABSORBABLE 12-7 MM EX MISC
CUTANEOUS | Status: AC
  Filled 2017-10-17: qty 1

## 2017-10-17 MED ORDER — MIDAZOLAM HCL 2 MG/2ML IJ SOLN
INTRAMUSCULAR | Status: AC | PRN
  Administered 2017-10-17: 1 mg via INTRAVENOUS

## 2017-10-17 MED ORDER — LIDOCAINE HCL (PF) 1 % IJ SOLN
INTRAMUSCULAR | Status: AC | PRN
  Administered 2017-10-17: 20 mL

## 2017-10-17 NOTE — Progress Notes (Signed)
HEMATOLOGY/ONCOLOGY INPATIENT PROGRESS NOTE  Date of Service: 10/16/2017 Inpatient Attending: .Bonnielee Haff, MD   SUBJECTIVE  Haley [name] had an attempt at ERCP yesterday that was unsuccesful. D/W Dr Paulita Fujita today. ERCP not possible here. Patient not havng any abdominal pain suggestive of pancreatitis or clinically significant perforation. She is keen to get a diagnosis.  No other acute new symptoms.  REVIEW OF SYSTEMS:   On review of symptoms, pt notes she has not eaten much during hospital stay, mostly liquid diet and has not had a bowel movement yet.    .10 Point review of Systems was done is negative except as noted above.   OBJECTIVE: NAD   PHYSICAL EXAMINATION: . Vitals:   10/16/17 0637 10/16/17 1300 10/16/17 1521 10/16/17 2102  BP: 112/68  109/76 115/76  Pulse: 87  84 79  Resp: 16  18 18   Temp: 99.2 F (37.3 C)  97.9 F (36.6 C) (!) 97.3 F (36.3 C)  TempSrc: Oral  Oral Oral  SpO2: 93%  93% 94%  Weight:  115 lb 4.8 oz (52.3 kg)    Height:      mild emotional distress. . GENERAL:alert, in no acute distress and comfortable SKIN: no acute rashes, no significant lesions EYES: icteric sclerae OROPHARYNX: MMM, no exudates, no oropharyngeal erythema or ulceration NECK: supple, no JVD LYMPH:  no palpable lymphadenopathy in the cervical, axillary or inguinal regions LUNGS: clear to auscultation b/l with normal respiratory effort HEART: regular rate & rhythm ABDOMEN:  normoactive bowel sounds , non tender, not distended. Extremity: no pedal edema PSYCH: alert & oriented x 3 with fluent speech NEURO: no focal motor/sensory deficits   MEDICAL HISTORY:  Past Medical History:  Diagnosis Date  . Allergy   . Osteopenia     SURGICAL HISTORY: Past Surgical History:  Procedure Laterality Date  . COLONOSCOPY    . COLONOSCOPY WITH PROPOFOL N/A 10/25/2014   Procedure: COLONOSCOPY WITH PROPOFOL;  Surgeon: Garlan Fair, MD;  Location: [facility];  Service:  Endoscopy;  Laterality: N/A;  . ERCP N/A 10/15/2017   Procedure: ENDOSCOPIC RETROGRADE CHOLANGIOPANCREATOGRAPHY (ERCP);  Surgeon: Clarene Essex, MD;  Location: [facility];  Service: Endoscopy;  Laterality: N/A;  . IR RADIOLOGIST EVAL & MGMT  10/02/2017    SOCIAL HISTORY: Social History   Socioeconomic History  . Marital status: Single    Spouse name: Not on file  . Number of children: Not on file  . Years of education: Not on file  . Highest education level: Not on file  Social Needs  . Financial resource strain: Not on file  . Food insecurity - worry: Not on file  . Food insecurity - inability: Not on file  . Transportation needs - medical: Not on file  . Transportation needs - non-medical: Not on file  Occupational History  . Not on file  Tobacco Use  . Smoking status: Former Smoker    Packs/day: 1.00    Years: 40.00    Pack years: 40.00    Types: Cigarettes    Last attempt to quit: 09/24/2017    Years since quitting: 0.0  . Smokeless tobacco: Never Used  Substance and Sexual Activity  . Alcohol use: Yes    Comment: rare social  . Drug use: No  . Sexual activity: Not Currently  Other Topics Concern  . Not on file  Social History Narrative  . Not on file    FAMILY HISTORY: Family History  Problem Relation Age of Onset  .  Diabetes Mother   . Hypertension Mother   . Diabetes Sister   . Hypertension Sister   . Hyperlipidemia Sister     ALLERGIES:  has No Known Allergies.  MEDICATIONS:  Scheduled Meds: Continuous Infusions: . sodium chloride 75 mL/hr at 10/16/17 2249  . ampicillin-sulbactam (UNASYN) IV Stopped (10/16/17 2319)   PRN Meds:.albuterol, HYDROmorphone (DILAUDID) injection, ondansetron (ZOFRAN) IV, promethazine, traZODone  LABORATORY DATA:  I have reviewed the data as listed . CBC Latest Ref Rng & Units 10/16/2017 10/15/2017 10/14/2017  WBC 4.0 - 10.5 K/uL 5.2 5.3 5.5  Hemoglobin 12.0 - 15.0 g/dL 12.4 13.1 12.9  Hematocrit 36.0 - 46.0 % 36.1 37.3  36.7  Platelets 150 - 400 K/uL 224 209 218    . CMP Latest Ref Rng & Units 10/16/2017 10/15/2017 10/14/2017  Glucose 65 - 99 mg/dL 111(H) 93 93  BUN 6 - 20 mg/dL 7 5(L) 10  Creatinine 0.44 - 1.00 mg/dL 0.53 0.61 0.54  Sodium 135 - 145 mmol/L 134(L) 135 131(L)  Potassium 3.5 - 5.1 mmol/L 4.0 4.3 4.3  Chloride 101 - 111 mmol/L 99(L) 99(L) 96(L)  CO2 22 - 32 mmol/L 19(L) 22 22  Calcium 8.9 - 10.3 mg/dL 8.9 9.2 8.9  Total Protein 6.5 - 8.1 g/dL 5.9(L) 6.4(L) 6.1(L)  Total Bilirubin 0.3 - 1.2 mg/dL 5.5(H) 5.2(H) 4.7(H)  Alkaline Phos 38 - 126 U/L 434(H) 493(H) 419(H)  AST 15 - 41 U/L 223(H) 280(H) 275(H)  ALT 14 - 54 U/L 165(H) 194(H) 192(H)     RADIOGRAPHIC STUDIES: I have personally reviewed the radiological images as listed and agreed with the findings in the report. Dg Chest 2 View  Result Date: 10/05/2017 CLINICAL DATA:  Chest pain and shortness of breath EXAM: CHEST  2 VIEW COMPARISON:  Lumbar MRI September 24, 2017 FINDINGS: There is consolidation in the medial right base. Lungs elsewhere are clear. Heart size and pulmonary vascularity are normal. No adenopathy. No pneumothorax. There is a burst type fracture of the L2 vertebral body, present on recent MR. IMPRESSION: Consolidation medial right base. Lungs elsewhere clear. No adenopathy. Burst type fracture L2 vertebral body, present on prior MR. Electronically Signed   By: Lowella Grip III M.D.   On: 10/05/2017 21:56   Ct Angio Chest Pe W And/or Wo Contrast  Result Date: 10/05/2017 CLINICAL DATA:  Pt c/o upper R chest pain, difficulty breathing, and cough. She has been experiencing lower back pain over the last few weeks and was dx'd with a crushed vertebrae and possibly lung cancer. EXAM: CT ANGIOGRAPHY CHEST WITH CONTRAST TECHNIQUE: Multidetector CT imaging of the chest was performed using the standard protocol during bolus administration of intravenous contrast. Multiplanar CT image reconstructions and MIPs were obtained to  evaluate the vascular anatomy. CONTRAST:  <See Chart> ISOVUE-370 IOPAMIDOL (ISOVUE-370) INJECTION 76% COMPARISON:  Current chest radiographs FINDINGS: Cardiovascular: Satisfactory opacification of the pulmonary arteries to the segmental level. No evidence of pulmonary embolism. Normal heart size. No pericardial effusion. There are mild, 3 vessel coronary artery calcifications. Aorta is normal in caliber. No dissection. Mild atherosclerotic disease noted along the arch and descending thoracic aorta. Mediastinum/Nodes: No neck base or axillary masses or adenopathy. Visualized thyroid is in appearance. Borderline enlarged right paratracheal, azygos level lymph node measuring 11 mm short axis. There is abnormal soft tissue that extends along the inferior right hilum and subcarinal mediastinum to surround the bronchus intermedius in occludes the more distal bronchus intermedius and right middle lobe bronchus. Lungs/Pleura: Complete atelectasis of the  right lower lobe. With near complete atelectasis of the right middle lobe. Moderate to advanced centrilobular emphysema. No discrete lung masses or nodules. No evidence of pneumonia or pulmonary edema. Small right pleural effusion. No pneumothorax. Upper Abdomen: Low-density liver lesions, 3 total seen, along the right lobe, measuring from 16 mm to 20 mm. Left adrenal mass measuring 16 mm. No acute findings in the upper abdomen. Musculoskeletal: No fracture or acute finding. No osteoblastic or osteolytic lesions. Review of the MIP images confirms the above findings. IMPRESSION: 1. No evidence of a pulmonary embolism. 2. Abnormal soft tissue, consistent with malignancy, surrounds the inferior right hilar structures and occludes the bronchus intermedius leading to complete atelectasis of the right lower lobe and significant atelectasis of the right middle lobe. There is a single borderline enlarged right paratracheal lymph node measuring 11 mm in short axis. 3. Three liver  lesions are seen suspicious for metastatic disease. Enlarged left adrenal gland is also suspicious for metastatic disease. 4. No convincing pneumonia or evidence of pulmonary edema. 5. Moderate to advanced emphysema. Aortic Atherosclerosis (ICD10-I70.0) and Emphysema (ICD10-J43.9). Electronically Signed   By: Lajean Manes M.D.   On: 10/05/2017 23:01   Mr Lumbar Spine W Wo Contrast  Result Date: 10/06/2017 CLINICAL DATA:  67 y/o F; bone metastasis with pathologic fracture of L2 vertebral body. Numbness and tingling beginning to radiate down her legs. EXAM: MRI LUMBAR SPINE WITHOUT AND WITH CONTRAST TECHNIQUE: Multiplanar and multiecho pulse sequences of the lumbar spine were obtained without and with intravenous contrast. CONTRAST:  54mL MULTIHANCE GADOBENATE DIMEGLUMINE 529 MG/ML IV SOLN COMPARISON:  09/24/2017 MRI of the lumbar spine. 10/05/2017 CT of the chest. FINDINGS: Segmentation:  Standard. Alignment:  Physiologic. Vertebrae: Diffuse low T1 signal with mild enhancement throughout the visible lumbar spine and sacrum patchy areas of fatty marrow sparing. Stable moderate compression deformity of L2 vertebral body with 7 mm retropulsion of superior endplate. Stable mild irregular enhancement of the L2 vertebral body. No new compression deformity. No epidural disease. Conus medullaris and cauda equina: Conus extends to the L1 level. Conus and cauda equina appear normal. Paraspinal and other soft tissues: Numerous masses throughout the liver measuring up to 23 mm in segment 2 and ill-defined nodule in left adrenal gland, likely metastatic disease. Disc levels: L1-2: Retropulsion of L2 superior endplate results in stable moderate canal stenosis. No foraminal stenosis. L2-3: No significant disc displacement, foraminal stenosis, or canal stenosis. L3-4: Small disc bulge with mild facet and ligamentum flavum hypertrophy. No significant foraminal or canal stenosis. L4-5: Small disc bulge with moderate facet  hypertrophy. No significant foraminal or canal stenosis. L5-S1: Disc bulge with endplate marginal osteophytes eccentric to the left and mild bilateral facet hypertrophy. Moderate left foraminal stenosis. No canal stenosis. IMPRESSION: 1. Stable L2 compression deformity with 7 mm retropulsion superior endplate resulting in moderate canal stenosis. 2. No abnormal enhancement of the visible cord or cauda equina. No new fracture. No epidural metastatic disease. 3. Diffuse decreased T1 signal throughout the visible spine and sacrum compatible with a fatty marrow replacement process which may be red marrow, neoplasm, or chemotherapy related response. 4. Stable lumbar spondylosis greatest at L5-S1 level where there is moderate left foraminal stenosis. 5. Multiple hepatic masses and left adrenal mass compatible with metastatic disease. Electronically Signed   By: Kristine Garbe M.D.   On: 10/06/2017 23:47   Mr 3d Recon At Scanner  Result Date: 10/15/2017 CLINICAL DATA:  Metastatic carcinoma of uncertain primary. Abnormal liver function tests and  jaundice. EXAM: MRI ABDOMEN WITHOUT AND WITH CONTRAST (INCLUDING MRCP) TECHNIQUE: Multiplanar multisequence MR imaging of the abdomen was performed both before and after the administration of intravenous contrast. Heavily T2-weighted images of the biliary and pancreatic ducts were obtained, and three-dimensional MRCP images were rendered by post processing. CONTRAST:  61mL MULTIHANCE GADOBENATE DIMEGLUMINE 529 MG/ML IV SOLN COMPARISON:  PET-CT dated 10/09/2017 and CT abdomen from 05/24/2017 FINDINGS: Lower chest: Airspace opacity and some volume loss in the right lower lobe is again observed with a suspected right infrahilar mass only partially seen today. Borderline cardiomegaly. Small right and trace left pleural effusions. Hepatobiliary: Moderate intrahepatic biliary dilatation with CBD dilatation up to 1.3 cm in diameter, and abrupt truncation of the common bile  duct in the vicinity of the pancreatic head mass. There are approximately 20 hypoenhancing masses scattered in the liver. One lesion in segment 3 of the liver measures 2.7 by 2.0 cm, previously 2.6 by 2.0 cm on 10/09/2017 by my measurement. There is mild gallbladder wall thickening with accentuated precontrast T1 signal in the gallbladder favoring sludge. Pancreas: Hypoenhancing mass in the head of the pancreas measures proximally 3.4 by 3.1 cm on image [id number]/11105. Mildly dilated dorsal pancreatic duct extending to the margin of this mass as shown on image 25/401. Trace peripancreatic edema. Spleen:  Unremarkable.  Trace perisplenic ascites. Adrenals/Urinary Tract: 1.1 by 1.8 cm left adrenal mass was previously hypermetabolic, and probably represents a metastatic lesion. The kidneys appear unremarkable. Trace right greater than left perirenal stranding. Stomach/Bowel: No dilated bowel identified. Vascular/Lymphatic:  Aortoiliac atherosclerotic vascular disease. Other:  No supplemental non-categorized findings. Musculoskeletal: Scattered widespread osseous metastatic lesions including the sternum, ribs, spine, and bony pelvis. L2 compression fracture with associated enhancement, and possibly acute component of fracture. IMPRESSION: 1. 3.4 cm pancreatic head mass is obstructing the common bile duct, which measures up to 1.3 cm in diameter. There is also moderate intrahepatic biliary dilatation. The mass also causes truncation of the dorsal pancreatic duct which is mildly dilated. 2. Approximately 20 metastatic lesions are identified in the liver. 3. Right lower lobe airspace opacity and atelectasis. Suspected right infrahilar mass. 4. Small right and trace left pleural effusions. 5. Small left adrenal mass is suspicious for a metastatic lesion. 6. Scattered widespread osseous metastatic disease. 7. Other imaging findings of potential clinical significance: Aortic Atherosclerosis (ICD10-I70.0). L2 compression fracture as  shown on prior MRI. Mild upper abdominal ascites with some trace edema along the mesentery and along several organs. Suspected gallbladder sludge with borderline distended gallbladder. Electronically Signed   By: Van Clines M.D.   On: 10/15/2017 09:55   Nm Pet Image Initial (pi) Skull Base To Thigh  Result Date: 10/09/2017 CLINICAL DATA:  Initial treatment strategy for metastatic disease of unknown primary. EXAM: NUCLEAR MEDICINE PET SKULL BASE TO THIGH TECHNIQUE: 6.3 mCi F-18 FDG was injected intravenously. Full-ring PET imaging was performed from the skull base to thigh after the radiotracer. CT data was obtained and used for attenuation correction and anatomic localization. FASTING BLOOD GLUCOSE:  Value: 95 mg/dl COMPARISON:  CT chest 10/05/2017.  Abdomen and pelvis CT 04/23/2017. FINDINGS: NECK: No hypermetabolic lymph nodes in the neck. CHEST: Mediastinal lymphadenopathy seen on recent CT chest is hypermetabolic. The subcarinal lymph node is measures an index with SUV max = 9.1. Mass lesion in the inferior right hilum is hypermetabolic with SUV max = 9.9. Hypermetabolic lymph nodes are seen in the thoracic inlet bilaterally, in the paratracheal space, and in the right hilum. Tiny  right pleural effusion noted. Emphysema noted in the lungs bilaterally. ABDOMEN/PELVIS: Multiple hypermetabolic liver metastases are evident. 3.0 cm lesion identified in the subcapsular liver on image 101 demonstrates SUV max = 8.3. Other scattered hypermetabolic liver metastases are evident. 2.4 cm lesion lateral segment left liver on image 118 demonstrates SUV max = 8.8. 3.6 x 3.9 cm soft tissue mass likely in the anterior head of pancreas is hypermetabolic with SUV max = 8.1. Layering high attenuation in the gallbladder lumen likely vicarious excretion of contrast from recent abdomen and pelvis CT as no gallstones were evident on 05/24/2017. Also possible that this represents sludge or interval development of innumerable  tiny mineralized gallstones. Mild intra and extrahepatic biliary duct dilatation is evident. Extrahepatic common duct measures 9 mm diameter and the common bile duct appears obliterated at the level of the above-described pancreatic head lesion. There is abdominal aortic atherosclerosis without aneurysm. SKELETON: Multiple hypermetabolic bone metastases are evident. Hypermetabolic lesion in the left humeral head demonstrates SUV max = 6.5. Multiple hypermetabolic metastases are seen in the spine. L3 lesion demonstrates SUV max = 8.7. Hypermetabolic lesion in the left acetabulum demonstrates SUV max = 7.5 left femoral neck lesion demonstrates SUV max = 8.5. IMPRESSION: 1. Hypermetabolic lymphadenopathy is identified in the thoracic inlet bilaterally, mediastinum, and right hilum with associated hypermetabolic disease in the right infrahilar region. Additionally, hypermetabolic liver metastases, diffuse bone metastases and a 3.9 cm hypermetabolic mass in the head of the pancreas are evident. Imaging findings may well be related to primary pancreatic adenocarcinoma with widespread metastatic involvement. Infrahilar right lung cancer with metastatic disease (including to the pancreas) is also a consideration. No pancreatic mass lesion was visible on the CT scan from 05/24/2017, even in retrospect. There is associated mild intra and extrahepatic biliary duct dilatation. EUS and/or abdominal MRI/MRCP may prove helpful to further evaluate. 2.  Aortic Atherosclerois (ICD10-170.0) 3.  Emphysema. (FBP10-C58.9) Electronically Signed   By: Misty Stanley M.D.   On: 10/09/2017 17:22   Dg Ercp Biliary & Pancreatic Ducts  Result Date: 10/15/2017 CLINICAL DATA:  Unsuccessful attempted ERCP EXAM: ERCP TECHNIQUE: Multiple spot images obtained with the fluoroscopic device and submitted for interpretation post-procedure. COMPARISON:  MRCP - 10/15/2017 FLUOROSCOPY TIME:  25 minutes, 45 seconds (175.39 mGy) FINDINGS: There are 4 spot  intraoperative fluoroscopic images of the right upper abdominal quadrant provided for review Initial image demonstrates an ERCP probe overlying the right upper abdominal quadrant. There is an additional thin drainage catheter overlying the expected location of the pancreatic duct. Subsequent images demonstrate contrast injection without definitive opacification of a recognizable feature of the biliary system. IMPRESSION: 1. Attempted ERCP as above. 2. Additional thin drainage catheter overlies expected location of the pancreatic duct. Above discussed with Dr. Watt Climes prior to completion of the procedure Electronically Signed   By: Sandi Mariscal M.D.   On: 10/15/2017 14:08   Mr Abdomen Mrcp Moise Boring Contast  Result Date: 10/15/2017 CLINICAL DATA:  Metastatic carcinoma of uncertain primary. Abnormal liver function tests and jaundice. EXAM: MRI ABDOMEN WITHOUT AND WITH CONTRAST (INCLUDING MRCP) TECHNIQUE: Multiplanar multisequence MR imaging of the abdomen was performed both before and after the administration of intravenous contrast. Heavily T2-weighted images of the biliary and pancreatic ducts were obtained, and three-dimensional MRCP images were rendered by post processing. CONTRAST:  1mL MULTIHANCE GADOBENATE DIMEGLUMINE 529 MG/ML IV SOLN COMPARISON:  PET-CT dated 10/09/2017 and CT abdomen from 05/24/2017 FINDINGS: Lower chest: Airspace opacity and some volume loss in the right lower  lobe is again observed with a suspected right infrahilar mass only partially seen today. Borderline cardiomegaly. Small right and trace left pleural effusions. Hepatobiliary: Moderate intrahepatic biliary dilatation with CBD dilatation up to 1.3 cm in diameter, and abrupt truncation of the common bile duct in the vicinity of the pancreatic head mass. There are approximately 20 hypoenhancing masses scattered in the liver. One lesion in segment 3 of the liver measures 2.7 by 2.0 cm, previously 2.6 by 2.0 cm on 10/09/2017 by my measurement.  There is mild gallbladder wall thickening with accentuated precontrast T1 signal in the gallbladder favoring sludge. Pancreas: Hypoenhancing mass in the head of the pancreas measures proximally 3.4 by 3.1 cm on image [id number]. Mildly dilated dorsal pancreatic duct extending to the margin of this mass as shown on image 25/401. Trace peripancreatic edema. Spleen:  Unremarkable.  Trace perisplenic ascites. Adrenals/Urinary Tract: 1.1 by 1.8 cm left adrenal mass was previously hypermetabolic, and probably represents a metastatic lesion. The kidneys appear unremarkable. Trace right greater than left perirenal stranding. Stomach/Bowel: No dilated bowel identified. Vascular/Lymphatic:  Aortoiliac atherosclerotic vascular disease. Other:  No supplemental non-categorized findings. Musculoskeletal: Scattered widespread osseous metastatic lesions including the sternum, ribs, spine, and bony pelvis. L2 compression fracture with associated enhancement, and possibly acute component of fracture. IMPRESSION: 1. 3.4 cm pancreatic head mass is obstructing the common bile duct, which measures up to 1.3 cm in diameter. There is also moderate intrahepatic biliary dilatation. The mass also causes truncation of the dorsal pancreatic duct which is mildly dilated. 2. Approximately 20 metastatic lesions are identified in the liver. 3. Right lower lobe airspace opacity and atelectasis. Suspected right infrahilar mass. 4. Small right and trace left pleural effusions. 5. Small left adrenal mass is suspicious for a metastatic lesion. 6. Scattered widespread osseous metastatic disease. 7. Other imaging findings of potential clinical significance: Aortic Atherosclerosis (ICD10-I70.0). L2 compression fracture as shown on prior MRI. Mild upper abdominal ascites with some trace edema along the mesentery and along several organs. Suspected gallbladder sludge with borderline distended gallbladder. Electronically Signed   By: Van Clines M.D.    On: 10/15/2017 09:55   Ir Radiologist Eval & Mgmt  Result Date: 10/02/2017 Please refer to notes tab for details about interventional procedure. (Op Note)  Mr Outside Films Spine  Result Date: 10/01/2017 This examination belongs to an outside facility and is stored here for comparison purposes only.  Contact the originating outside institution for any associated report or interpretation.   ASSESSMENT & PLAN:   Haley [name] is a 67 y.o. caucasian female with    Haley [name] is a 67 y.o. caucasian female with    1. Newly diagnosed metastatic malignancy - concerning for metastatic carcinoma of lung primary vs pancreatic primary. Given speed of progression, pattern of involvement - concern to r/o small cell lung cancer.  2. L2 Compression Fracture - appears pathological on MRI  Concern for metastatic malignancy with extensive bone involvement. SPEP - unrevealing . 3. Hyponatremia - poor po intake vs SIADH (related to pain/lung involvement) sodium 134 -recommended patient consume more salty foods -will need to evaluate further based on other w/u findings -counseled patient to seek immediate help if AMS or headaches  4.New onset obstructive Jaundice due to biliary compression from pancreatic mass MRI/MRCP reviewed ERCP not successful yesterday - plastic stent placed in PD PLAN:  -NPO from midnight -US guided biopsy of liver metastasis tomorrow for tissue diagnosis. -outpatient ERCP attempt at [facility] vs percutaneous biliary drainage per  GI -further oncologic mx based on tissue diagnosis which needs to be established in an expedited fashion. -discussed plan  I spent 20 minutes counseling the patient face to face. The total time spent in the appointment was 25 minutes and more than 50% was on counseling and direct patient cares.    Sullivan Lone MD [place] AAHIVMS Gastroenterology [facility] Hematology/Oncology Physician [facility]  (Office):       [phone] (Work cell):   [phone] (Fax):           [phone]

## 2017-10-17 NOTE — Progress Notes (Signed)
Subjective: No abdominal pain. Feels weak.  Objective: Vital signs in last 24 hours: Temp:  [97.3 F (36.3 C)-98.1 F (36.7 C)] 98.1 F (36.7 C) (02/22 0433) Pulse Rate:  [78-84] 78 (02/22 0433) Resp:  [18] 18 (02/22 0433) BP: (109-138)/(76-84) 138/84 (02/22 0433) SpO2:  [93 %-96 %] 96 % (02/22 0433) Weight:  [115 lb 4.8 oz (52.3 kg)] 115 lb 4.8 oz (52.3 kg) (02/21 1300) Weight change: -11.2 oz (-0.771 kg) Last BM Date: 10/15/17  PE: GEN:  Weak, cachectic, deconditioned-appearing but NAD ABD:  Soft, non-tender SKIN:  Jaundiced  Lab Results: CBC    Component Value Date/Time   WBC 5.6 10/17/2017 0314   RBC 4.01 10/17/2017 0314   HGB 12.0 10/17/2017 0314   HCT 35.0 (L) 10/17/2017 0314   PLT 219 10/17/2017 0314   MCV 87.3 10/17/2017 0314   MCH 29.9 10/17/2017 0314   MCHC 34.3 10/17/2017 0314   RDW 13.3 10/17/2017 0314   LYMPHSABS 0.9 10/16/2017 0348   MONOABS 0.6 10/16/2017 0348   EOSABS 0.0 10/16/2017 0348   BASOSABS 0.0 10/16/2017 0348   CMP     Component Value Date/Time   NA 135 10/17/2017 0314   K 3.5 10/17/2017 0314   CL 99 (L) 10/17/2017 0314   CO2 21 (L) 10/17/2017 0314   GLUCOSE 83 10/17/2017 0314   BUN 8 10/17/2017 0314   CREATININE 0.54 10/17/2017 0314   CREATININE 0.80 10/01/2017 1002   CREATININE 0.80 05/12/2017 1110   CALCIUM 8.7 (L) 10/17/2017 0314   PROT 5.8 (L) 10/17/2017 0314   ALBUMIN 2.6 (L) 10/17/2017 0314   AST 223 (H) 10/17/2017 0314   AST 43 (H) 10/01/2017 1002   ALT 155 (H) 10/17/2017 0314   ALT 16 10/01/2017 1002   ALKPHOS 416 (H) 10/17/2017 0314   BILITOT 5.0 (H) 10/17/2017 0314   BILITOT 0.6 10/01/2017 1002   GFRNONAA >60 10/17/2017 0314   GFRNONAA >60 10/01/2017 1002   GFRNONAA 81 05/03/2016 0827   GFRAA >60 10/17/2017 0314   GFRAA >60 10/01/2017 1002   GFRAA >89 05/03/2016 0827   Assessment:  1.  Obstructive jaundice.  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 2.  Elevated LFTs.   Likely both liver lesions and bile duct obstruction.  Plan:  1.  Plan for U/S guided liver biopsy today, Dr. Irene Limbo would like to expedite pathology so if small cell cancer is seen we can expedite her course of treatment. 2.  I will see if we can get ERCP done as inpatient/outpatient at [facility] next week. 3.  Patient and family understandably concerned about her functional status, and not sure how she will do if she is sent home after her biopsy and before her ERCP; I advised them to discuss these logistics with primary team and Dr. Irene Limbo. 4.  [facility] will follow.   Landry Dyke 10/17/2017, 11:37 AM   Cell [phone] If no answer or after 5 PM call [phone]

## 2017-10-17 NOTE — Procedures (Signed)
panc ca with liver mets  S/p [place] LEFT LIVER MASS BX  No comp Stable EBL 0 PATH PENDING FULL REPORT IN PACS

## 2017-10-17 NOTE — Progress Notes (Signed)
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  She was hospitalized due to progressively worsening abdominal pain and abnormal LFT's.  Reason for Visit: Jaundice  Consultants: Gastroenterology.  Interventional radiology.  Oncology.  Procedures:  ERCP 2/20 Impression:                - The major papilla appeared to be bulging. - A biliary sphincterotomy was performed x2. - One plastic stent was placed into the ventral  pancreatic duct. False channel created with probable retroperitoneal dye injection and wire advancement   Antibiotics: None  Subjective/Interval History: Patient denies any new complaints today.  Waiting on her liver biopsy.  Denies any vomiting or nausea.   ROS: Denies any chest pain or shortness of breath.  Objective:  Vital Signs  Vitals:   10/16/17 1300 10/16/17 1521 10/16/17 2102 10/17/17 0433  BP:  109/76 115/76 138/84  Pulse:  84 79 78  Resp:  18 18 18   Temp:  97.9 F (36.6 C) (!) 97.3 F (36.3 C) 98.1 F (36.7 C)  TempSrc:  Oral Oral Oral  SpO2:  93% 94% 96%  Weight: 52.3 kg (115 lb 4.8 oz)     Height:        Intake/Output Summary (Last 24 hours) at 10/17/2017 1105 Last data filed at 10/17/2017 0825 Gross per 24 hour  Intake 2078.75 ml  Output 1650 ml  Net 428.75 ml   Filed Weights   10/14/17 2030 10/15/17 1020 10/16/17 1300  Weight: 53.2 kg (117 lb 4.6 oz) 53.1 kg (117 lb) 52.3 kg (115 lb 4.8 oz)    General appearance: Awake alert.  In no distress Resp: Clear to auscultation bilaterally Cardio: S1-S2 is normal regular.  No S3-S4.  no rubs murmurs or bruit. GI: Abdomen remains soft.  Mildly tender in the upper abdomen without any rebound  rigidity or guarding.  No masses organomegaly.   Extremities: No edema Neurologic: No obvious focal deficits.  Lab Results:  Data Reviewed: I have personally reviewed following labs and imaging studies  CBC: Recent Labs  Lab 10/13/17 1800 10/14/17 0404 10/15/17 0400 10/16/17 0348 10/17/17 0314  WBC 5.7 5.5 5.3 5.2 5.6  NEUTROABS  --   --  3.3 3.6  --   HGB 13.4 12.9 13.1 12.4 12.0  HCT 37.3 36.7 37.3 36.1 35.0*  MCV 85.0 86.6 86.9 87.0 87.3  PLT 248 218 209 224 789    Basic Metabolic Panel: Recent Labs  Lab 10/13/17 1800 10/14/17 0404 10/15/17 0400 10/16/17 0348 10/17/17 0314  NA 129* 131* 135 134* 135  K 4.2 4.3 4.3 4.0 3.5  CL 95* 96* 99* 99* 99*  CO2 20* 22 22 19* 21*  GLUCOSE 102* 93 93 111* 83  BUN 12 10 5* 7 8  CREATININE 0.50 0.54 0.61 0.53 0.54  CALCIUM 9.1 8.9 9.2 8.9 8.7*    GFR: Estimated Creatinine Clearance: 57.9 mL/min (by C-G formula based on SCr of 0.54 mg/dL).  Liver Function Tests: Recent Labs  Lab 10/13/17 1800 10/14/17 0404 10/15/17 0400 10/16/17 0348 10/17/17 0314  AST 290* 275* 280* 223* 223*  ALT 201* 192* 194* 165* 155*  ALKPHOS 470* 419* 493* 434* 416*  BILITOT 5.4* 4.7* 5.2* 5.5* 5.0*  PROT 6.8 6.1*  6.4* 5.9* 5.8*  ALBUMIN 3.2* 2.9* 2.9* 2.7* 2.6*    Recent Labs  Lab 10/14/17 0404  LIPASE 333*    Coagulation Profile: Recent Labs  Lab 10/15/17 0400  INR 1.09    CBG: No results for input(s): GLUCAP in the last 168 hours.   Radiology Studies: Dg Ercp Biliary & Pancreatic Ducts  Result Date: 10/15/2017 CLINICAL DATA:  Unsuccessful attempted ERCP EXAM: ERCP TECHNIQUE: Multiple spot images obtained with the fluoroscopic device and submitted for interpretation post-procedure. COMPARISON:  MRCP - 10/15/2017 FLUOROSCOPY TIME:  25 minutes, 45 seconds (175.39 mGy) FINDINGS: There are 4 spot intraoperative fluoroscopic images of the right upper abdominal quadrant provided for review Initial image demonstrates an ERCP probe  overlying the right upper abdominal quadrant. There is an additional thin drainage catheter overlying the expected location of the pancreatic duct. Subsequent images demonstrate contrast injection without definitive opacification of a recognizable feature of the biliary system. IMPRESSION: 1. Attempted ERCP as above. 2. Additional thin drainage catheter overlies expected location of the pancreatic duct. Above discussed with Dr. Watt Climes prior to completion of the procedure Electronically Signed   By: Sandi Mariscal M.D.   On: 10/15/2017 14:08     Medications:  Scheduled:  Continuous: . sodium chloride 75 mL/hr at 10/16/17 2249  . ampicillin-sulbactam (UNASYN) IV 1.5 g (10/17/17 1033)   GYK:[id number], HYDROmorphone (DILAUDID) injection, ondansetron (ZOFRAN) IV, promethazine, traZODone  Assessment/Plan:  Active Problems:   Jaundice   Malignant obstructive jaundice (HCC)   Metastatic disease (HCC)   Liver metastases (HCC)    Abdominal pain with obstructive jaundice/Abnormal LFTs Imaging studies done so for raise concern for pancreatic head lesion.  MRCP showed obstructive pancreatic head mass.  Liver lesions concerning for metastases also noted.  Patient was seen by gastroenterology.  Patient underwent ERCP 2/20 however biliary stent could not be placed despite multiple attempts.  A stent was placed in the pancreatic duct.  Discussed with gastroenterology yesterday.  Further management of her obstructive jaundice to be addressed as an outpatient.  [facility] for [facility] at ERCP or may have to be considered for percutaneous approach.  Continue pain medications.  Abdomen remains benign without any evidence for peritonitis.  Continue antibiotics for now.  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  Oncology was consulted.  Ultrasound-guided liver biopsy was ordered but was not done yesterday.  Reordered by oncology  yesterday.  Hopefully this will be accomplished today.  Discussed with oncologist Dr. Irene Limbo yesterday.  L2 compression fracture Likely pathological.  Not symptomatic.  No neurological deficits.  Mobilize.  Hyponatremia Has improved.  History of COPD Stable.   DVT Prophylaxis: SCD's Code Status: Full code Family Communication: Discussed with the patient. Disposition Plan: Management as outlined above.  Await liver biopsy.    LOS: 4 days   [facility] Pager [phone] 10/17/2017, 11:05 AM  If 7PM-7AM, please contact night-coverage at [email or web address], password [facility]

## 2017-10-18 LAB — COMPREHENSIVE METABOLIC PANEL
ALT: 141 U/L — AB (ref 14–54)
ANION GAP: 15 (ref 5–15)
AST: 198 U/L — ABNORMAL HIGH (ref 15–41)
Albumin: 2.5 g/dL — ABNORMAL LOW (ref 3.5–5.0)
Alkaline Phosphatase: 418 U/L — ABNORMAL HIGH (ref 38–126)
BUN: 6 mg/dL (ref 6–20)
CHLORIDE: 97 mmol/L — AB (ref 101–111)
CO2: 22 mmol/L (ref 22–32)
CREATININE: 0.43 mg/dL — AB (ref 0.44–1.00)
Calcium: 8.5 mg/dL — ABNORMAL LOW (ref 8.9–10.3)
GFR calc non Af Amer: >60 mL/min (ref >60)
Glucose, Bld: 88 mg/dL (ref 65–99)
POTASSIUM: 3.1 mmol/L — AB (ref 3.5–5.1)
SODIUM: 134 mmol/L — AB (ref 135–145)
Total Bilirubin: 5.7 mg/dL — ABNORMAL HIGH (ref 0.3–1.2)
Total Protein: 5.7 g/dL — ABNORMAL LOW (ref 6.5–8.1)

## 2017-10-18 LAB — CBC
HCT: 34 % — ABNORMAL LOW (ref 36.0–46.0)
Hemoglobin: 11.7 g/dL — ABNORMAL LOW (ref 12.0–15.0)
MCH: 29.9 pg (ref 26.0–34.0)
MCHC: 34.4 g/dL (ref 30.0–36.0)
MCV: 87 fL (ref 78.0–100.0)
PLATELETS: 211 10*3/uL (ref 150–400)
RBC: 3.91 MIL/uL (ref 3.87–5.11)
RDW: 13.4 % (ref 11.5–15.5)
WBC: 5.1 10*3/uL (ref 4.0–10.5)

## 2017-10-18 MED ORDER — POTASSIUM CHLORIDE CRYS ER 20 MEQ PO TBCR
40.0000 meq | EXTENDED_RELEASE_TABLET | ORAL | Status: AC
  Administered 2017-10-18 (×2): 40 meq via ORAL
  Filled 2017-10-18 (×2): qty 2

## 2017-10-18 MED ORDER — TIZANIDINE HCL 4 MG PO TABS: 4.0000 mg | ORAL_TABLET | Freq: Three times a day (TID) | ORAL | 0 refills | Status: DC | PRN

## 2017-10-18 MED ORDER — OXYCODONE HCL 5 MG PO TABS: 5.0000 mg | ORAL_TABLET | ORAL | 0 refills | Status: DC | PRN

## 2017-10-18 MED ORDER — OXYCODONE HCL 5 MG PO TABS
5.0000 mg | ORAL_TABLET | ORAL | Status: DC | PRN
  Administered 2017-10-18 (×2): 5 mg via ORAL
  Filled 2017-10-18: qty 1
  Filled 2017-10-18: qty 2

## 2017-10-18 MED ORDER — ONDANSETRON HCL 4 MG PO TABS: 4.0000 mg | ORAL_TABLET | Freq: Three times a day (TID) | ORAL | 0 refills | Status: DC | PRN

## 2017-10-18 MED ORDER — POLYETHYLENE GLYCOL 3350 17 G PO PACK: 17.0000 g | PACK | Freq: Every day | ORAL | 0 refills | Status: DC | PRN

## 2017-10-18 MED ORDER — TIZANIDINE HCL 4 MG PO TABS: 4.0000 mg | ORAL_TABLET | Freq: Three times a day (TID) | ORAL | Status: DC | PRN

## 2017-10-18 NOTE — Progress Notes (Signed)
HEMATOLOGY/ONCOLOGY INPATIENT PROGRESS NOTE  Date of Service: 10/17/2017 Inpatient Attending: .Bonnielee Haff, MD   SUBJECTIVE  Haley [name] had an US guided biopsy of the liver mets today.  No other acute new symptoms. Breathing stable. Limited po intake. No new abdominal pain. Wondering about how she will be able to get ERCP - family contemplating transfer to outside institution for ERCP [facility] vs [facility].  REVIEW OF SYSTEMS:   .10 Point review of Systems was done is negative except as noted above.   OBJECTIVE: NAD  PHYSICAL EXAMINATION: . Vitals:   10/17/17 1220 10/17/17 1404 10/17/17 1614 10/17/17 2112  BP: 107/77 136/73 121/76 131/80  Pulse: 81 82 75 72  Resp: (!) 21  16 16   Temp:  98 F (36.7 C) 98.7 F (37.1 C) 98.1 F (36.7 C)  TempSrc:  Oral Oral Oral  SpO2: 96%  93% 92%  Weight:      Height:      mild emotional distress and anxiety waiting on procedures and tissue diagnosis. GENERAL:alert, in no acute distress and comfortable SKIN: no acute rashes, no significant lesions EYES: icteric sclerae OROPHARYNX: MMM, no exudates, no oropharyngeal erythema or ulceration NECK: supple, no JVD LYMPH:  no palpable lymphadenopathy in the cervical, axillary or inguinal regions LUNGS: clear to auscultation b/l with normal respiratory effort HEART: regular rate & rhythm ABDOMEN:  normoactive bowel sounds , non tender, not distended. Extremity: no pedal edema PSYCH: alert & oriented x 3 with fluent speech NEURO: no focal motor/sensory deficits   MEDICAL HISTORY:  Past Medical History:  Diagnosis Date  . Allergy   . Osteopenia     SURGICAL HISTORY: Past Surgical History:  Procedure Laterality Date  . COLONOSCOPY    . COLONOSCOPY WITH PROPOFOL N/A 10/25/2014   Procedure: COLONOSCOPY WITH PROPOFOL;  Surgeon: Garlan Fair, MD;  Location: [facility];  Service: Endoscopy;  Laterality: N/A;  . ERCP N/A 10/15/2017   Procedure: ENDOSCOPIC RETROGRADE  CHOLANGIOPANCREATOGRAPHY (ERCP);  Surgeon: Clarene Essex, MD;  Location: [facility];  Service: Endoscopy;  Laterality: N/A;  . IR RADIOLOGIST EVAL & MGMT  10/02/2017    SOCIAL HISTORY: Social History   Socioeconomic History  . Marital status: Single    Spouse name: Not on file  . Number of children: Not on file  . Years of education: Not on file  . Highest education level: Not on file  Social Needs  . Financial resource strain: Not on file  . Food insecurity - worry: Not on file  . Food insecurity - inability: Not on file  . Transportation needs - medical: Not on file  . Transportation needs - non-medical: Not on file  Occupational History  . Not on file  Tobacco Use  . Smoking status: Former Smoker    Packs/day: 1.00    Years: 40.00    Pack years: 40.00    Types: Cigarettes    Last attempt to quit: 09/24/2017    Years since quitting: 0.0  . Smokeless tobacco: Never Used  Substance and Sexual Activity  . Alcohol use: Yes    Comment: rare social  . Drug use: No  . Sexual activity: Not Currently  Other Topics Concern  . Not on file  Social History Narrative  . Not on file    FAMILY HISTORY: Family History  Problem Relation Age of Onset  . Diabetes Mother   . Hypertension Mother   . Diabetes Sister   . Hypertension Sister   . Hyperlipidemia Sister  ALLERGIES:  has No Known Allergies.  MEDICATIONS:  Scheduled Meds: Continuous Infusions: . sodium chloride 75 mL/hr at 10/18/17 0225  . ampicillin-sulbactam (UNASYN) IV Stopped (10/17/17 2345)   PRN Meds:.albuterol, HYDROmorphone (DILAUDID) injection, ondansetron (ZOFRAN) IV, promethazine, traZODone  LABORATORY DATA:  I have reviewed the data as listed . CBC Latest Ref Rng & Units 10/17/2017 10/16/2017 10/15/2017  WBC 4.0 - 10.5 K/uL 5.6 5.2 5.3  Hemoglobin 12.0 - 15.0 g/dL 12.0 12.4 13.1  Hematocrit 36.0 - 46.0 % 35.0(L) 36.1 37.3  Platelets 150 - 400 K/uL 219 224 209    . CMP Latest Ref Rng & Units  10/17/2017 10/16/2017 10/15/2017  Glucose 65 - 99 mg/dL 83 111(H) 93  BUN 6 - 20 mg/dL 8 7 5(L)  Creatinine 0.44 - 1.00 mg/dL 0.54 0.53 0.61  Sodium 135 - 145 mmol/L 135 134(L) 135  Potassium 3.5 - 5.1 mmol/L 3.5 4.0 4.3  Chloride 101 - 111 mmol/L 99(L) 99(L) 99(L)  CO2 22 - 32 mmol/L 21(L) 19(L) 22  Calcium 8.9 - 10.3 mg/dL 8.7(L) 8.9 9.2  Total Protein 6.5 - 8.1 g/dL 5.8(L) 5.9(L) 6.4(L)  Total Bilirubin 0.3 - 1.2 mg/dL 5.0(H) 5.5(H) 5.2(H)  Alkaline Phos 38 - 126 U/L 416(H) 434(H) 493(H)  AST 15 - 41 U/L 223(H) 223(H) 280(H)  ALT 14 - 54 U/L 155(H) 165(H) 194(H)     RADIOGRAPHIC STUDIES: I have personally reviewed the radiological images as listed and agreed with the findings in the report. Dg Chest 2 View  Result Date: 10/05/2017 CLINICAL DATA:  Chest pain and shortness of breath EXAM: CHEST  2 VIEW COMPARISON:  Lumbar MRI September 24, 2017 FINDINGS: There is consolidation in the medial right base. Lungs elsewhere are clear. Heart size and pulmonary vascularity are normal. No adenopathy. No pneumothorax. There is a burst type fracture of the L2 vertebral body, present on recent MR. IMPRESSION: Consolidation medial right base. Lungs elsewhere clear. No adenopathy. Burst type fracture L2 vertebral body, present on prior MR. Electronically Signed   By: Lowella Grip III M.D.   On: 10/05/2017 21:56   Ct Angio Chest Pe W And/or Wo Contrast  Result Date: 10/05/2017 CLINICAL DATA:  Pt c/o upper R chest pain, difficulty breathing, and cough. She has been experiencing lower back pain over the last few weeks and was dx'd with a crushed vertebrae and possibly lung cancer. EXAM: CT ANGIOGRAPHY CHEST WITH CONTRAST TECHNIQUE: Multidetector CT imaging of the chest was performed using the standard protocol during bolus administration of intravenous contrast. Multiplanar CT image reconstructions and MIPs were obtained to evaluate the vascular anatomy. CONTRAST:  <See Chart> ISOVUE-370 IOPAMIDOL  (ISOVUE-370) INJECTION 76% COMPARISON:  Current chest radiographs FINDINGS: Cardiovascular: Satisfactory opacification of the pulmonary arteries to the segmental level. No evidence of pulmonary embolism. Normal heart size. No pericardial effusion. There are mild, 3 vessel coronary artery calcifications. Aorta is normal in caliber. No dissection. Mild atherosclerotic disease noted along the arch and descending thoracic aorta. Mediastinum/Nodes: No neck base or axillary masses or adenopathy. Visualized thyroid is in appearance. Borderline enlarged right paratracheal, azygos level lymph node measuring 11 mm short axis. There is abnormal soft tissue that extends along the inferior right hilum and subcarinal mediastinum to surround the bronchus intermedius in occludes the more distal bronchus intermedius and right middle lobe bronchus. Lungs/Pleura: Complete atelectasis of the right lower lobe. With near complete atelectasis of the right middle lobe. Moderate to advanced centrilobular emphysema. No discrete lung masses or nodules. No evidence of  pneumonia or pulmonary edema. Small right pleural effusion. No pneumothorax. Upper Abdomen: Low-density liver lesions, 3 total seen, along the right lobe, measuring from 16 mm to 20 mm. Left adrenal mass measuring 16 mm. No acute findings in the upper abdomen. Musculoskeletal: No fracture or acute finding. No osteoblastic or osteolytic lesions. Review of the MIP images confirms the above findings. IMPRESSION: 1. No evidence of a pulmonary embolism. 2. Abnormal soft tissue, consistent with malignancy, surrounds the inferior right hilar structures and occludes the bronchus intermedius leading to complete atelectasis of the right lower lobe and significant atelectasis of the right middle lobe. There is a single borderline enlarged right paratracheal lymph node measuring 11 mm in short axis. 3. Three liver lesions are seen suspicious for metastatic disease. Enlarged left adrenal  gland is also suspicious for metastatic disease. 4. No convincing pneumonia or evidence of pulmonary edema. 5. Moderate to advanced emphysema. Aortic Atherosclerosis (ICD10-I70.0) and Emphysema (ICD10-J43.9). Electronically Signed   By: Lajean Manes M.D.   On: 10/05/2017 23:01   Mr Lumbar Spine W Wo Contrast  Result Date: 10/06/2017 CLINICAL DATA:  67 y/o F; bone metastasis with pathologic fracture of L2 vertebral body. Numbness and tingling beginning to radiate down her legs. EXAM: MRI LUMBAR SPINE WITHOUT AND WITH CONTRAST TECHNIQUE: Multiplanar and multiecho pulse sequences of the lumbar spine were obtained without and with intravenous contrast. CONTRAST:  10mL MULTIHANCE GADOBENATE DIMEGLUMINE 529 MG/ML IV SOLN COMPARISON:  09/24/2017 MRI of the lumbar spine. 10/05/2017 CT of the chest. FINDINGS: Segmentation:  Standard. Alignment:  Physiologic. Vertebrae: Diffuse low T1 signal with mild enhancement throughout the visible lumbar spine and sacrum patchy areas of fatty marrow sparing. Stable moderate compression deformity of L2 vertebral body with 7 mm retropulsion of superior endplate. Stable mild irregular enhancement of the L2 vertebral body. No new compression deformity. No epidural disease. Conus medullaris and cauda equina: Conus extends to the L1 level. Conus and cauda equina appear normal. Paraspinal and other soft tissues: Numerous masses throughout the liver measuring up to 23 mm in segment 2 and ill-defined nodule in left adrenal gland, likely metastatic disease. Disc levels: L1-2: Retropulsion of L2 superior endplate results in stable moderate canal stenosis. No foraminal stenosis. L2-3: No significant disc displacement, foraminal stenosis, or canal stenosis. L3-4: Small disc bulge with mild facet and ligamentum flavum hypertrophy. No significant foraminal or canal stenosis. L4-5: Small disc bulge with moderate facet hypertrophy. No significant foraminal or canal stenosis. L5-S1: Disc bulge with  endplate marginal osteophytes eccentric to the left and mild bilateral facet hypertrophy. Moderate left foraminal stenosis. No canal stenosis. IMPRESSION: 1. Stable L2 compression deformity with 7 mm retropulsion superior endplate resulting in moderate canal stenosis. 2. No abnormal enhancement of the visible cord or cauda equina. No new fracture. No epidural metastatic disease. 3. Diffuse decreased T1 signal throughout the visible spine and sacrum compatible with a fatty marrow replacement process which may be red marrow, neoplasm, or chemotherapy related response. 4. Stable lumbar spondylosis greatest at L5-S1 level where there is moderate left foraminal stenosis. 5. Multiple hepatic masses and left adrenal mass compatible with metastatic disease. Electronically Signed   By: Kristine Garbe M.D.   On: 10/06/2017 23:47   Mr 3d Recon At Scanner  Result Date: 10/15/2017 CLINICAL DATA:  Metastatic carcinoma of uncertain primary. Abnormal liver function tests and jaundice. EXAM: MRI ABDOMEN WITHOUT AND WITH CONTRAST (INCLUDING MRCP) TECHNIQUE: Multiplanar multisequence MR imaging of the abdomen was performed both before and after the administration  of intravenous contrast. Heavily T2-weighted images of the biliary and pancreatic ducts were obtained, and three-dimensional MRCP images were rendered by post processing. CONTRAST:  52mL MULTIHANCE GADOBENATE DIMEGLUMINE 529 MG/ML IV SOLN COMPARISON:  PET-CT dated 10/09/2017 and CT abdomen from 05/24/2017 FINDINGS: Lower chest: Airspace opacity and some volume loss in the right lower lobe is again observed with a suspected right infrahilar mass only partially seen today. Borderline cardiomegaly. Small right and trace left pleural effusions. Hepatobiliary: Moderate intrahepatic biliary dilatation with CBD dilatation up to 1.3 cm in diameter, and abrupt truncation of the common bile duct in the vicinity of the pancreatic head mass. There are approximately 20  hypoenhancing masses scattered in the liver. One lesion in segment 3 of the liver measures 2.7 by 2.0 cm, previously 2.6 by 2.0 cm on 10/09/2017 by my measurement. There is mild gallbladder wall thickening with accentuated precontrast T1 signal in the gallbladder favoring sludge. Pancreas: Hypoenhancing mass in the head of the pancreas measures proximally 3.4 by 3.1 cm on image [id number]. Mildly dilated dorsal pancreatic duct extending to the margin of this mass as shown on image 25/401. Trace peripancreatic edema. Spleen:  Unremarkable.  Trace perisplenic ascites. Adrenals/Urinary Tract: 1.1 by 1.8 cm left adrenal mass was previously hypermetabolic, and probably represents a metastatic lesion. The kidneys appear unremarkable. Trace right greater than left perirenal stranding. Stomach/Bowel: No dilated bowel identified. Vascular/Lymphatic:  Aortoiliac atherosclerotic vascular disease. Other:  No supplemental non-categorized findings. Musculoskeletal: Scattered widespread osseous metastatic lesions including the sternum, ribs, spine, and bony pelvis. L2 compression fracture with associated enhancement, and possibly acute component of fracture. IMPRESSION: 1. 3.4 cm pancreatic head mass is obstructing the common bile duct, which measures up to 1.3 cm in diameter. There is also moderate intrahepatic biliary dilatation. The mass also causes truncation of the dorsal pancreatic duct which is mildly dilated. 2. Approximately 20 metastatic lesions are identified in the liver. 3. Right lower lobe airspace opacity and atelectasis. Suspected right infrahilar mass. 4. Small right and trace left pleural effusions. 5. Small left adrenal mass is suspicious for a metastatic lesion. 6. Scattered widespread osseous metastatic disease. 7. Other imaging findings of potential clinical significance: Aortic Atherosclerosis (ICD10-I70.0). L2 compression fracture as shown on prior MRI. Mild upper abdominal ascites with some trace edema along  the mesentery and along several organs. Suspected gallbladder sludge with borderline distended gallbladder. Electronically Signed   By: Van Clines M.D.   On: 10/15/2017 09:55   Nm Pet Image Initial (pi) Skull Base To Thigh  Result Date: 10/09/2017 CLINICAL DATA:  Initial treatment strategy for metastatic disease of unknown primary. EXAM: NUCLEAR MEDICINE PET SKULL BASE TO THIGH TECHNIQUE: 6.3 mCi F-18 FDG was injected intravenously. Full-ring PET imaging was performed from the skull base to thigh after the radiotracer. CT data was obtained and used for attenuation correction and anatomic localization. FASTING BLOOD GLUCOSE:  Value: 95 mg/dl COMPARISON:  CT chest 10/05/2017.  Abdomen and pelvis CT 04/23/2017. FINDINGS: NECK: No hypermetabolic lymph nodes in the neck. CHEST: Mediastinal lymphadenopathy seen on recent CT chest is hypermetabolic. The subcarinal lymph node is measures an index with SUV max = 9.1. Mass lesion in the inferior right hilum is hypermetabolic with SUV max = 9.9. Hypermetabolic lymph nodes are seen in the thoracic inlet bilaterally, in the paratracheal space, and in the right hilum. Tiny right pleural effusion noted. Emphysema noted in the lungs bilaterally. ABDOMEN/PELVIS: Multiple hypermetabolic liver metastases are evident. 3.0 cm lesion identified in the subcapsular liver on  image 101 demonstrates SUV max = 8.3. Other scattered hypermetabolic liver metastases are evident. 2.4 cm lesion lateral segment left liver on image 118 demonstrates SUV max = 8.8. 3.6 x 3.9 cm soft tissue mass likely in the anterior head of pancreas is hypermetabolic with SUV max = 8.1. Layering high attenuation in the gallbladder lumen likely vicarious excretion of contrast from recent abdomen and pelvis CT as no gallstones were evident on 05/24/2017. Also possible that this represents sludge or interval development of innumerable tiny mineralized gallstones. Mild intra and extrahepatic biliary duct  dilatation is evident. Extrahepatic common duct measures 9 mm diameter and the common bile duct appears obliterated at the level of the above-described pancreatic head lesion. There is abdominal aortic atherosclerosis without aneurysm. SKELETON: Multiple hypermetabolic bone metastases are evident. Hypermetabolic lesion in the left humeral head demonstrates SUV max = 6.5. Multiple hypermetabolic metastases are seen in the spine. L3 lesion demonstrates SUV max = 8.7. Hypermetabolic lesion in the left acetabulum demonstrates SUV max = 7.5 left femoral neck lesion demonstrates SUV max = 8.5. IMPRESSION: 1. Hypermetabolic lymphadenopathy is identified in the thoracic inlet bilaterally, mediastinum, and right hilum with associated hypermetabolic disease in the right infrahilar region. Additionally, hypermetabolic liver metastases, diffuse bone metastases and a 3.9 cm hypermetabolic mass in the head of the pancreas are evident. Imaging findings may well be related to primary pancreatic adenocarcinoma with widespread metastatic involvement. Infrahilar right lung cancer with metastatic disease (including to the pancreas) is also a consideration. No pancreatic mass lesion was visible on the CT scan from 05/24/2017, even in retrospect. There is associated mild intra and extrahepatic biliary duct dilatation. EUS and/or abdominal MRI/MRCP may prove helpful to further evaluate. 2.  Aortic Atherosclerois (ICD10-170.0) 3.  Emphysema. (HFW26-V78.9) Electronically Signed   By: Misty Stanley M.D.   On: 10/09/2017 17:22   US Biopsy (liver)  Result Date: 10/17/2017 INDICATION: PANCREAS MASS WITH LIVER METASTASES EXAM: ULTRASOUND GUIDED CORE BIOPSY OF LEFT HEPATIC MASS MEDICATIONS: 1% lidocaine local ANESTHESIA/SEDATION: Fentanyl 50 mcg IV; Versed 1.0 mg IV Moderate Sedation Time:  10 minutes The patient was continuously monitored during the procedure by the interventional radiology nurse under my direct supervision. PROCEDURE: The  procedure, risks, benefits, and alternatives were explained to the patient. Questions regarding the procedure were encouraged and answered. The patient understands and consents to the procedure. Previous imaging reviewed. Preliminary ultrasound performed. A left hepatic hypoechoic solid mass was demonstrated in the epigastric region. Overlying skin marked. The epigastric region was prepped with ChloraPrep in a sterile fashion, and a sterile drape was applied covering the operative field. A sterile gown and sterile gloves were used for the procedure. Local anesthesia was provided with 1% Lidocaine. Under sterile conditions and local anesthesia, a 17 gauge 6.8 cm access needle was advanced from an epigastric percutaneous approach to the left hepatic mass. Needle position confirmed with ultrasound. 3 18 gauge core biopsies obtained through the lesion. Images obtained for documentation. Samples placed in formalin. Needle tract occlusion performed with Gel-Foam. Postprocedure imaging demonstrates no hemorrhage or hematoma. Patient tolerated the biopsy well. COMPLICATIONS: None immediate. FINDINGS: Imaging confirms needle placed in the left hepatic mass in the lateral segment anteriorly. IMPRESSION: Successful CT-guided left hepatic mass 18 gauge core biopsy Electronically Signed   By: Jerilynn Mages.  Shick M.D.   On: 10/17/2017 13:19   Dg Ercp Biliary & Pancreatic Ducts  Result Date: 10/15/2017 CLINICAL DATA:  Unsuccessful attempted ERCP EXAM: ERCP TECHNIQUE: Multiple spot images obtained with the fluoroscopic device  and submitted for interpretation post-procedure. COMPARISON:  MRCP - 10/15/2017 FLUOROSCOPY TIME:  25 minutes, 45 seconds (175.39 mGy) FINDINGS: There are 4 spot intraoperative fluoroscopic images of the right upper abdominal quadrant provided for review Initial image demonstrates an ERCP probe overlying the right upper abdominal quadrant. There is an additional thin drainage catheter overlying the expected location  of the pancreatic duct. Subsequent images demonstrate contrast injection without definitive opacification of a recognizable feature of the biliary system. IMPRESSION: 1. Attempted ERCP as above. 2. Additional thin drainage catheter overlies expected location of the pancreatic duct. Above discussed with Dr. Watt Climes prior to completion of the procedure Electronically Signed   By: Sandi Mariscal M.D.   On: 10/15/2017 14:08   Mr Abdomen Mrcp Moise Boring Contast  Result Date: 10/15/2017 CLINICAL DATA:  Metastatic carcinoma of uncertain primary. Abnormal liver function tests and jaundice. EXAM: MRI ABDOMEN WITHOUT AND WITH CONTRAST (INCLUDING MRCP) TECHNIQUE: Multiplanar multisequence MR imaging of the abdomen was performed both before and after the administration of intravenous contrast. Heavily T2-weighted images of the biliary and pancreatic ducts were obtained, and three-dimensional MRCP images were rendered by post processing. CONTRAST:  91mL MULTIHANCE GADOBENATE DIMEGLUMINE 529 MG/ML IV SOLN COMPARISON:  PET-CT dated 10/09/2017 and CT abdomen from 05/24/2017 FINDINGS: Lower chest: Airspace opacity and some volume loss in the right lower lobe is again observed with a suspected right infrahilar mass only partially seen today. Borderline cardiomegaly. Small right and trace left pleural effusions. Hepatobiliary: Moderate intrahepatic biliary dilatation with CBD dilatation up to 1.3 cm in diameter, and abrupt truncation of the common bile duct in the vicinity of the pancreatic head mass. There are approximately 20 hypoenhancing masses scattered in the liver. One lesion in segment 3 of the liver measures 2.7 by 2.0 cm, previously 2.6 by 2.0 cm on 10/09/2017 by my measurement. There is mild gallbladder wall thickening with accentuated precontrast T1 signal in the gallbladder favoring sludge. Pancreas: Hypoenhancing mass in the head of the pancreas measures proximally 3.4 by 3.1 cm on image [id number]. Mildly dilated dorsal  pancreatic duct extending to the margin of this mass as shown on image 25/401. Trace peripancreatic edema. Spleen:  Unremarkable.  Trace perisplenic ascites. Adrenals/Urinary Tract: 1.1 by 1.8 cm left adrenal mass was previously hypermetabolic, and probably represents a metastatic lesion. The kidneys appear unremarkable. Trace right greater than left perirenal stranding. Stomach/Bowel: No dilated bowel identified. Vascular/Lymphatic:  Aortoiliac atherosclerotic vascular disease. Other:  No supplemental non-categorized findings. Musculoskeletal: Scattered widespread osseous metastatic lesions including the sternum, ribs, spine, and bony pelvis. L2 compression fracture with associated enhancement, and possibly acute component of fracture. IMPRESSION: 1. 3.4 cm pancreatic head mass is obstructing the common bile duct, which measures up to 1.3 cm in diameter. There is also moderate intrahepatic biliary dilatation. The mass also causes truncation of the dorsal pancreatic duct which is mildly dilated. 2. Approximately 20 metastatic lesions are identified in the liver. 3. Right lower lobe airspace opacity and atelectasis. Suspected right infrahilar mass. 4. Small right and trace left pleural effusions. 5. Small left adrenal mass is suspicious for a metastatic lesion. 6. Scattered widespread osseous metastatic disease. 7. Other imaging findings of potential clinical significance: Aortic Atherosclerosis (ICD10-I70.0). L2 compression fracture as shown on prior MRI. Mild upper abdominal ascites with some trace edema along the mesentery and along several organs. Suspected gallbladder sludge with borderline distended gallbladder. Electronically Signed   By: Van Clines M.D.   On: 10/15/2017 09:55   Ir Radiologist Eval &  Mgmt  Result Date: 10/02/2017 Please refer to notes tab for details about interventional procedure. (Op Note)  Mr Outside Films Spine  Result Date: 10/01/2017 This examination belongs to an outside  facility and is stored here for comparison purposes only.  Contact the originating outside institution for any associated report or interpretation.   ASSESSMENT & PLAN:   Haley [name] is a 67 y.o. caucasian female with    Haley [name] is a 66 y.o. caucasian female with    1. Newly diagnosed metastatic malignancy - concerning for metastatic carcinoma of lung primary vs pancreatic primary. Given speed of progression, pattern of involvement - concern to r/o small cell lung cancer.  2. L2 Compression Fracture - appears pathological on MRI  Concern for metastatic malignancy with extensive bone involvement. SPEP - unrevealing . 3. Hyponatremia - poor po intake vs SIADH (related to pain/lung involvement) sodium 135 -recommended patient consume more salty foods  4.New onset obstructive Jaundice due to biliary compression from pancreatic mass MRI/MRCP reviewed ERCP not successful - plastic stent placed in PD PLAN:  -s/p uneventful biopsy of liver lesion --pending pathology results - likely early next week. -inpatient vs outpatient ERCP attempt at [facility] - ?transfer for ERCP vs outpatient ERCP. Would appreciate help from GI to help expedite the procedure by co-ordinating with outside facility. -further oncologic mx based on tissue diagnosis . Will need to have biliary system de-compressed to allow for initiating treatment once tissue diagnosis available. -discussed plan with patient.  I spent 20 minutes counseling the patient face to face. The total time spent in the appointment was 25 minutes and more than 50% was on counseling and direct patient cares.    Sullivan Lone MD [place] AAHIVMS [facility] Hematology/Oncology Physician [facility]  (Office):       [phone] (Work cell):  [phone] (Fax):           [phone]

## 2017-10-18 NOTE — Progress Notes (Signed)
Patient discharged to home with family, discharge instructions reviewed with patient who verbalized understanding. New RX's given to patient. 

## 2017-10-18 NOTE — Discharge Summary (Signed)
[facility]  Physician Discharge Summary   Patient ID: Haley [name] MRN: [id number] DOB/AGE: Sep 16, 1950 67 y.o.  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 Patient instructed to call gastroenterology office on Monday. 2. Liver biopsy pathology report is pending.   DISCHARGE CONDITION: fair  Diet recommendation: As tolerated  Filed Weights   10/14/17 2030 10/15/17 1020 10/16/17 1300  Weight: 53.2 kg (117 lb 4.6 oz) 53.1 kg (117 lb) 52.3 kg (115 lb 4.8 oz)    INITIAL HISTORY: 67 year old Caucasian female with a past medical history of tobacco abuse, recently diagnosed with metastatic carcinoma of unknown primary but thought to be either lung or pancreatic.  She was hospitalized due to progressively worsening abdominal pain and abnormal LFT's.  Consultants: Gastroenterology.  Interventional radiology.  Oncology.  Procedures:  ERCP 2/20 Impression:  - The major papilla appeared to be bulging. - A biliary sphincterotomy was performed x2. - One plastic stent was placed into the ventral pancreatic duct. False channel created with probable retroperitoneal dye injection and wire advancement  Ultrasound-guided liver biopsy   HOSPITAL COURSE:   Abdominal pain with obstructive jaundice/Abnormal LFTs Imaging studies done so for raise concern for pancreatic head lesion.  Abnormalities also noted in the right hilar area on CT scan. MRCP showed obstructive pancreatic head mass.  Liver lesions concerning for metastases also noted.  Patient was seen by gastroenterology.  Patient underwent ERCP 2/20 however biliary stent could not be placed despite multiple attempts.  A stent was placed in the pancreatic duct.  There was concern for  retroperitoneal extravasation during the procedure.  Patient was started on antibiotics and monitored closely.  Her abdomen remains benign.  No evidence for peritonitis.  Antibiotics can be discontinued.   Discussed with gastroenterology today, Dr. Watt Climes.  Patient and family has been keen on being referred to a tertiary center.  Dr. Paulita Fujita and Dr. Perley Jain office will schedule outpatient ERCP at [facility].  Since patient is otherwise stable and since her stable this can be pursued as an outpatient.  Patient instructed to call GI office on Monday.  She will be prescribed narcotics for pain control.  Reports poor appetite but has been tolerating diet without any nausea or vomiting.  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  Oncology was consulted.  Ultrasound-guided liver biopsy was performed on 2/22.  Pathology report is pending.  Oncology will schedule outpatient follow-up based on reports.    L2 compression fracture Likely pathological.  Not symptomatic.  No neurological deficits.    Able to ambulate without any difficulties..  Hyponatremia/hypokalemia Sodium levels have improved.  Potassium was noted to be low and will be repleted today prior to discharge.  History of COPD Stable.  Overall stable.  Detailed discussion with patient and family yesterday and today.  Medically is okay for discharge.   PERTINENT LABS:  The results of significant diagnostics from this hospitalization (including imaging, microbiology, ancillary and laboratory) are listed below for reference.      Labs: Basic Metabolic Panel: Recent Labs  Lab 10/14/17 0404 10/15/17 0400 10/16/17 0348 10/17/17 0314 10/18/17 0405  NA 131* 135 134* 135 134*  K 4.3 4.3 4.0 3.5 3.1*  CL 96* 99* 99* 99* 97*  CO2 22 22 19* 21* 22  GLUCOSE 93 93 111* 83 88  BUN 10 5* 7 8 6   CREATININE 0.54 0.61 0.53 0.54 0.43*  CALCIUM 8.9 9.2 8.9 8.7* 8.5*   Liver Function  Tests: Recent Labs  Lab 10/14/17 0404 10/15/17 0400 10/16/17 0348 10/17/17 0314 10/18/17 0405  AST 275* 280* 223* 223* 198*  ALT 192* 194* 165* 155* 141*  ALKPHOS 419* 493* 434* 416* 418*  BILITOT 4.7* 5.2* 5.5* 5.0* 5.7*  PROT 6.1* 6.4* 5.9* 5.8* 5.7*  ALBUMIN 2.9* 2.9* 2.7* 2.6* 2.5*   Recent Labs  Lab 10/14/17 0404  LIPASE 333*   CBC: Recent Labs  Lab 10/14/17 0404 10/15/17 0400 10/16/17 0348 10/17/17 0314 10/18/17 0405  WBC 5.5 5.3 5.2 5.6 5.1  NEUTROABS  --  3.3 3.6  --   --   HGB 12.9 13.1 12.4 12.0 11.7*  HCT 36.7 37.3 36.1 35.0* 34.0*  MCV 86.6 86.9 87.0 87.3 87.0  PLT 218 209 224 219 211     IMAGING STUDIES  Mr 3d Recon At Scanner  Result Date: 10/15/2017 CLINICAL DATA:  Metastatic carcinoma of uncertain primary. Abnormal liver function tests and jaundice. EXAM: MRI ABDOMEN WITHOUT AND WITH CONTRAST (INCLUDING MRCP) TECHNIQUE: Multiplanar multisequence MR imaging of the abdomen was performed both before and after the administration of intravenous contrast. Heavily T2-weighted images of the biliary and pancreatic ducts were obtained, and three-dimensional MRCP images were rendered by post processing. CONTRAST:  47mL MULTIHANCE GADOBENATE DIMEGLUMINE 529 MG/ML IV SOLN COMPARISON:  PET-CT dated 10/09/2017 and CT abdomen from 05/24/2017 FINDINGS: Lower chest: Airspace opacity and some volume loss in the right lower lobe is again observed with a suspected right infrahilar mass only partially seen today. Borderline cardiomegaly. Small right and trace left pleural effusions. Hepatobiliary: Moderate intrahepatic biliary dilatation with CBD dilatation up to 1.3 cm in diameter, and abrupt truncation of the common bile duct in the vicinity of the pancreatic head mass. There are approximately 20 hypoenhancing masses scattered in the liver. One lesion in segment 3 of the liver measures 2.7 by 2.0 cm, previously 2.6 by 2.0 cm on 10/09/2017 by my measurement. There is mild  gallbladder wall thickening with accentuated precontrast T1 signal in the gallbladder favoring sludge. Pancreas: Hypoenhancing mass in the head of the pancreas measures proximally 3.4 by 3.1 cm on image 62/11105. Mildly dilated dorsal pancreatic duct extending to the margin of this mass as shown on image 25/401. Trace peripancreatic edema. Spleen:  Unremarkable.  Trace perisplenic ascites. Adrenals/Urinary Tract: 1.1 by 1.8 cm left adrenal mass was previously hypermetabolic, and probably represents a metastatic lesion. The kidneys appear unremarkable. Trace right greater than left perirenal stranding. Stomach/Bowel: No dilated bowel identified. Vascular/Lymphatic:  Aortoiliac atherosclerotic vascular disease. Other:  No supplemental non-categorized findings. Musculoskeletal: Scattered widespread osseous metastatic lesions including the sternum, ribs, spine, and bony pelvis. L2 compression fracture with associated enhancement, and possibly acute component of fracture. IMPRESSION: 1. 3.4 cm pancreatic head mass is obstructing the common bile duct, which measures up to 1.3 cm in diameter. There is also moderate intrahepatic biliary dilatation. The mass also causes truncation of the dorsal pancreatic duct which is mildly dilated. 2. Approximately 20 metastatic lesions are identified in the liver. 3. Right lower lobe airspace opacity and atelectasis. Suspected right infrahilar mass. 4. Small right and trace left pleural effusions. 5. Small left adrenal mass is suspicious for a metastatic lesion. 6. Scattered widespread osseous metastatic disease. 7. Other imaging findings of potential clinical significance:  Aortic Atherosclerosis (ICD10-I70.0). L2 compression fracture as shown on prior MRI. Mild upper abdominal ascites with some trace edema along the mesentery and along several organs. Suspected gallbladder sludge with borderline distended gallbladder. Electronically Signed   By: Van Clines M.D.   On: 10/15/2017  09:55    US Biopsy (liver)  Result Date: 10/17/2017 INDICATION: PANCREAS MASS WITH LIVER METASTASES EXAM: ULTRASOUND GUIDED CORE BIOPSY OF LEFT HEPATIC MASS MEDICATIONS: 1% lidocaine local ANESTHESIA/SEDATION: Fentanyl 50 mcg IV; Versed 1.0 mg IV Moderate Sedation Time:  10 minutes The patient was continuously monitored during the procedure by the interventional radiology nurse under my direct supervision. PROCEDURE: The procedure, risks, benefits, and alternatives were explained to the patient. Questions regarding the procedure were encouraged and answered. The patient understands and consents to the procedure. Previous imaging reviewed. Preliminary ultrasound performed. A left hepatic hypoechoic solid mass was demonstrated in the epigastric region. Overlying skin marked. The epigastric region was prepped with ChloraPrep in a sterile fashion, and a sterile drape was applied covering the operative field. A sterile gown and sterile gloves were used for the procedure. Local anesthesia was provided with 1% Lidocaine. Under sterile conditions and local anesthesia, a 17 gauge 6.8 cm access needle was advanced from an epigastric percutaneous approach to the left hepatic mass. Needle position confirmed with ultrasound. 3 18 gauge core biopsies obtained through the lesion. Images obtained for documentation. Samples placed in formalin. Needle tract occlusion performed with Gel-Foam. Postprocedure imaging demonstrates no hemorrhage or hematoma. Patient tolerated the biopsy well. COMPLICATIONS: None immediate. FINDINGS: Imaging confirms needle placed in the left hepatic mass in the lateral segment anteriorly. IMPRESSION: Successful CT-guided left hepatic mass 18 gauge core biopsy Electronically Signed   By: Jerilynn Mages.  Shick M.D.   On: 10/17/2017 13:19   Dg Ercp Biliary & Pancreatic Ducts  Result Date: 10/15/2017 CLINICAL DATA:  Unsuccessful attempted ERCP EXAM: ERCP TECHNIQUE: Multiple spot images obtained with the  fluoroscopic device and submitted for interpretation post-procedure. COMPARISON:  MRCP - 10/15/2017 FLUOROSCOPY TIME:  25 minutes, 45 seconds (175.39 mGy) FINDINGS: There are 4 spot intraoperative fluoroscopic images of the right upper abdominal quadrant provided for review Initial image demonstrates an ERCP probe overlying the right upper abdominal quadrant. There is an additional thin drainage catheter overlying the expected location of the pancreatic duct. Subsequent images demonstrate contrast injection without definitive opacification of a recognizable feature of the biliary system. IMPRESSION: 1. Attempted ERCP as above. 2. Additional thin drainage catheter overlies expected location of the pancreatic duct. Above discussed with Dr. Watt Climes prior to completion of the procedure Electronically Signed   By: Sandi Mariscal M.D.   On: 10/15/2017 14:08   Mr Abdomen Mrcp Moise Boring Contast  Result Date: 10/15/2017 CLINICAL DATA:  Metastatic carcinoma of uncertain primary. Abnormal liver function tests and jaundice. EXAM: MRI ABDOMEN WITHOUT AND WITH CONTRAST (INCLUDING MRCP) TECHNIQUE: Multiplanar multisequence MR imaging of the abdomen was performed both before and after the administration of intravenous contrast. Heavily T2-weighted images of the biliary and pancreatic ducts were obtained, and three-dimensional MRCP images were rendered by post processing. CONTRAST:  22mL MULTIHANCE GADOBENATE DIMEGLUMINE 529 MG/ML IV SOLN COMPARISON:  PET-CT dated 10/09/2017 and CT abdomen from 05/24/2017 FINDINGS: Lower chest: Airspace opacity and some volume loss in the right lower lobe is again observed with a suspected right infrahilar mass only partially seen today. Borderline cardiomegaly. Small right and trace left pleural effusions. Hepatobiliary: Moderate intrahepatic biliary dilatation with CBD dilatation up to 1.3 cm in diameter,  and abrupt truncation of the common bile duct in the vicinity of the pancreatic head mass. There are  approximately 20 hypoenhancing masses scattered in the liver. One lesion in segment 3 of the liver measures 2.7 by 2.0 cm, previously 2.6 by 2.0 cm on 10/09/2017 by my measurement. There is mild gallbladder wall thickening with accentuated precontrast T1 signal in the gallbladder favoring sludge. Pancreas: Hypoenhancing mass in the head of the pancreas measures proximally 3.4 by 3.1 cm on image 62/11105. Mildly dilated dorsal pancreatic duct extending to the margin of this mass as shown on image 25/401. Trace peripancreatic edema. Spleen:  Unremarkable.  Trace perisplenic ascites. Adrenals/Urinary Tract: 1.1 by 1.8 cm left adrenal mass was previously hypermetabolic, and probably represents a metastatic lesion. The kidneys appear unremarkable. Trace right greater than left perirenal stranding. Stomach/Bowel: No dilated bowel identified. Vascular/Lymphatic:  Aortoiliac atherosclerotic vascular disease. Other:  No supplemental non-categorized findings. Musculoskeletal: Scattered widespread osseous metastatic lesions including the sternum, ribs, spine, and bony pelvis. L2 compression fracture with associated enhancement, and possibly acute component of fracture. IMPRESSION: 1. 3.4 cm pancreatic head mass is obstructing the common bile duct, which measures up to 1.3 cm in diameter. There is also moderate intrahepatic biliary dilatation. The mass also causes truncation of the dorsal pancreatic duct which is mildly dilated. 2. Approximately 20 metastatic lesions are identified in the liver. 3. Right lower lobe airspace opacity and atelectasis. Suspected right infrahilar mass. 4. Small right and trace left pleural effusions. 5. Small left adrenal mass is suspicious for a metastatic lesion. 6. Scattered widespread osseous metastatic disease. 7. Other imaging findings of potential clinical significance: Aortic Atherosclerosis (ICD10-I70.0). L2 compression fracture as shown on prior MRI. Mild upper abdominal ascites with some  trace edema along the mesentery and along several organs. Suspected gallbladder sludge with borderline distended gallbladder. Electronically Signed   By: Van Clines M.D.   On: 10/15/2017 09:55    DISCHARGE EXAMINATION: Vitals:   10/17/17 1404 10/17/17 1614 10/17/17 2112 10/18/17 0425  BP: 136/73 121/76 131/80 107/64  Pulse: 82 75 72 65  Resp:  16 16 16   Temp: 98 F (36.7 C) 98.7 F (37.1 C) 98.1 F (36.7 C) 98.2 F (36.8 C)  TempSrc: Oral Oral Oral Oral  SpO2:  93% 92% 92%  Weight:      Height:       General appearance: alert, cooperative, appears stated age and no distress Resp: clear to auscultation bilaterally Cardio: regular rate and rhythm, S1, S2 normal, no murmur, click, rub or gallop GI: soft, non-tender; bowel sounds normal; no masses,  no organomegaly  DISPOSITION: Home  Discharge Instructions    Call MD for:  difficulty breathing, headache or visual disturbances   Complete by:  As directed    Call MD for:  extreme fatigue   Complete by:  As directed    Call MD for:  persistant dizziness or light-headedness   Complete by:  As directed    Call MD for:  persistant nausea and vomiting   Complete by:  As directed    Call MD for:  severe uncontrolled pain   Complete by:  As directed    Call MD for:  temperature >100.4   Complete by:  As directed    Discharge instructions   Complete by:  As directed    Please call Dr. Erlinda Hong office on monday (2/25) to see when you have been scheduled for ERCP. Seek attention if symptoms become severe. Diet as tolerated.  You  were cared for by a hospitalist during your hospital stay. If you have any questions about your discharge medications or the care you received while you were in the hospital after you are discharged, you can call the unit and asked to speak with the hospitalist on call if the hospitalist that took care of you is not available. Once you are discharged, your primary care physician will handle any further  medical issues. Please note that NO REFILLS for any discharge medications will be authorized once you are discharged, as it is imperative that you return to your primary care physician (or establish a relationship with a primary care physician if you do not have one) for your aftercare needs so that they can reassess your need for medications and monitor your lab values. If you do not have a primary care physician, you can call [phone] for a physician referral.   Increase activity slowly   Complete by:  As directed         Allergies as of 10/18/2017   No Known Allergies     Medication List    STOP taking these medications   acetaminophen 500 MG tablet Commonly known as:  TYLENOL   atorvastatin 40 MG tablet Commonly known as:  LIPITOR   oxyCODONE-acetaminophen 5-325 MG tablet Commonly known as:  PERCOCET/ROXICET     TAKE these medications   albuterol 108 (90 Base) MCG/ACT inhaler Commonly known as:  PROVENTIL HFA;VENTOLIN HFA Inhale 2 puffs into the lungs every 6 (six) hours as needed for wheezing or shortness of breath.   ondansetron 4 MG tablet Commonly known as:  ZOFRAN Take 1 tablet (4 mg total) by mouth every 8 (eight) hours as needed for nausea or vomiting.   oxyCODONE 5 MG immediate release tablet Commonly known as:  Oxy IR/ROXICODONE Take 1-2 tablets (5-10 mg total) by mouth every 4 (four) hours as needed for severe pain.   polyethylene glycol packet Commonly known as:  MIRALAX Take 17 g by mouth daily as needed for moderate constipation.   senna-docusate 8.6-50 MG tablet Commonly known as:  SENNA S Take 2 tablets by mouth at bedtime.   tiZANidine 4 MG tablet Commonly known as:  ZANAFLEX Take 1 tablet (4 mg total) by mouth every 8 (eight) hours as needed for muscle spasms.        Follow-up Information    Arta Silence, MD Follow up.   Specialty:  Gastroenterology Why:  Please call his office on monday (2/25) to see when you have been scheduled for  ERCP Contact information: [address] [place] [address] [phone]        Brunetta Genera, MD Follow up.   Specialties:  Hematology, Oncology Why:  his office will call once pathology report is available Contact information: [place] [address] [phone]           TOTAL DISCHARGE TIME: 35 mins  Bonnielee Haff  [facility] Pager [phone]  10/18/2017, 2:25 PM

## 2017-10-21 ENCOUNTER — Other Ambulatory Visit: Payer: Self-pay

## 2017-10-21 ENCOUNTER — Telehealth: Payer: Self-pay

## 2017-10-21 DIAGNOSIS — C799 Secondary malignant neoplasm of unspecified site: Secondary | ICD-10-CM

## 2017-10-21 NOTE — Telephone Encounter (Signed)
Called pt to inform her of appt for lab and doctor visit tomorrow afternoon at 1445. Pt verbalized understanding and availability.

## 2017-10-22 ENCOUNTER — Encounter (HOSPITAL_COMMUNITY): Payer: Self-pay

## 2017-10-22 ENCOUNTER — Inpatient Hospital Stay (HOSPITAL_BASED_OUTPATIENT_CLINIC_OR_DEPARTMENT_OTHER): Payer: No Typology Code available for payment source | Admitting: Hematology

## 2017-10-22 ENCOUNTER — Encounter: Payer: Self-pay | Admitting: Hematology

## 2017-10-22 ENCOUNTER — Emergency Department (HOSPITAL_COMMUNITY)
Admission: EM | Admit: 2017-10-22 | Discharge: 2017-10-22 | Disposition: A | Discharge status: Home / Self Care | Payer: 59 | Attending: Emergency Medicine | Admitting: Emergency Medicine

## 2017-10-22 ENCOUNTER — Inpatient Hospital Stay: Payer: No Typology Code available for payment source

## 2017-10-22 VITALS — BP 98/71 | HR 85 | Temp 97.5°F | Resp 18 | Ht 63.0 in | Wt 115.3 lb

## 2017-10-22 DIAGNOSIS — R918 Other nonspecific abnormal finding of lung field: Secondary | ICD-10-CM | POA: Diagnosis not present

## 2017-10-22 DIAGNOSIS — C7970 Secondary malignant neoplasm of unspecified adrenal gland: Secondary | ICD-10-CM

## 2017-10-22 DIAGNOSIS — M545 Low back pain: Secondary | ICD-10-CM | POA: Diagnosis not present

## 2017-10-22 DIAGNOSIS — G893 Neoplasm related pain (acute) (chronic): Secondary | ICD-10-CM

## 2017-10-22 DIAGNOSIS — K831 Obstruction of bile duct: Secondary | ICD-10-CM

## 2017-10-22 DIAGNOSIS — K5900 Constipation, unspecified: Secondary | ICD-10-CM | POA: Diagnosis not present

## 2017-10-22 DIAGNOSIS — Z7189 Other specified counseling: Secondary | ICD-10-CM | POA: Insufficient documentation

## 2017-10-22 DIAGNOSIS — Z87891 Personal history of nicotine dependence: Secondary | ICD-10-CM | POA: Insufficient documentation

## 2017-10-22 DIAGNOSIS — M25551 Pain in right hip: Secondary | ICD-10-CM

## 2017-10-22 DIAGNOSIS — M8448XA Pathological fracture, other site, initial encounter for fracture: Secondary | ICD-10-CM

## 2017-10-22 DIAGNOSIS — M8580 Other specified disorders of bone density and structure, unspecified site: Secondary | ICD-10-CM | POA: Diagnosis not present

## 2017-10-22 DIAGNOSIS — I2510 Atherosclerotic heart disease of native coronary artery without angina pectoris: Secondary | ICD-10-CM

## 2017-10-22 DIAGNOSIS — C3490 Malignant neoplasm of unspecified part of unspecified bronchus or lung: Secondary | ICD-10-CM | POA: Diagnosis not present

## 2017-10-22 DIAGNOSIS — Z79899 Other long term (current) drug therapy: Secondary | ICD-10-CM

## 2017-10-22 DIAGNOSIS — C801 Malignant (primary) neoplasm, unspecified: Secondary | ICD-10-CM | POA: Diagnosis not present

## 2017-10-22 DIAGNOSIS — C7951 Secondary malignant neoplasm of bone: Secondary | ICD-10-CM | POA: Insufficient documentation

## 2017-10-22 DIAGNOSIS — C799 Secondary malignant neoplasm of unspecified site: Secondary | ICD-10-CM

## 2017-10-22 DIAGNOSIS — R188 Other ascites: Secondary | ICD-10-CM | POA: Diagnosis not present

## 2017-10-22 DIAGNOSIS — C787 Secondary malignant neoplasm of liver and intrahepatic bile duct: Secondary | ICD-10-CM

## 2017-10-22 DIAGNOSIS — F17210 Nicotine dependence, cigarettes, uncomplicated: Secondary | ICD-10-CM

## 2017-10-22 DIAGNOSIS — D7589 Other specified diseases of blood and blood-forming organs: Secondary | ICD-10-CM | POA: Diagnosis not present

## 2017-10-22 DIAGNOSIS — X58XXXS Exposure to other specified factors, sequela: Secondary | ICD-10-CM

## 2017-10-22 DIAGNOSIS — E871 Hypo-osmolality and hyponatremia: Secondary | ICD-10-CM | POA: Diagnosis not present

## 2017-10-22 DIAGNOSIS — X58XXXA Exposure to other specified factors, initial encounter: Secondary | ICD-10-CM | POA: Diagnosis not present

## 2017-10-22 DIAGNOSIS — M25552 Pain in left hip: Secondary | ICD-10-CM | POA: Diagnosis not present

## 2017-10-22 DIAGNOSIS — M1990 Unspecified osteoarthritis, unspecified site: Secondary | ICD-10-CM

## 2017-10-22 DIAGNOSIS — M8448XS Pathological fracture, other site, sequela: Secondary | ICD-10-CM | POA: Diagnosis not present

## 2017-10-22 LAB — CMP (CANCER CENTER ONLY)
ALT: 201 U/L — ABNORMAL HIGH (ref 0–55)
AST: 257 U/L (ref 5–34)
Albumin: 2.7 g/dL — ABNORMAL LOW (ref 3.5–5.0)
Alkaline Phosphatase: 457 U/L — ABNORMAL HIGH (ref 40–150)
Anion gap: 13 — ABNORMAL HIGH (ref 3–11)
BUN: 8 mg/dL (ref 7–26)
CO2: 22 mmol/L (ref 22–29)
Calcium: 9.4 mg/dL (ref 8.4–10.4)
Chloride: 89 mmol/L — ABNORMAL LOW (ref 98–109)
Creatinine: 0.65 mg/dL (ref 0.60–1.10)
GFR, Est AFR Am: >60 mL/min
GFR, Estimated: >60 mL/min
Glucose, Bld: 111 mg/dL (ref 70–140)
Potassium: 4 mmol/L (ref 3.5–5.1)
Sodium: 124 mmol/L — ABNORMAL LOW (ref 136–145)
Total Bilirubin: 8.9 mg/dL (ref 0.2–1.2)
Total Protein: 6.5 g/dL (ref 6.4–8.3)

## 2017-10-22 LAB — CBC WITH DIFFERENTIAL (CANCER CENTER ONLY)
Basophils Absolute: 0 K/uL (ref 0.0–0.1)
Basophils Relative: 0 %
Eosinophils Absolute: 0.1 K/uL (ref 0.0–0.5)
Eosinophils Relative: 1 %
HCT: 37.2 % (ref 34.8–46.6)
Hemoglobin: 13.1 g/dL (ref 11.6–15.9)
Lymphocytes Relative: 11 %
Lymphs Abs: 0.6 K/uL — ABNORMAL LOW (ref 0.9–3.3)
MCH: 29.9 pg (ref 25.1–34.0)
MCHC: 35.2 g/dL (ref 31.5–36.0)
MCV: 84.9 fL (ref 79.5–101.0)
Monocytes Absolute: 0.8 K/uL (ref 0.1–0.9)
Monocytes Relative: 14 %
Neutro Abs: 3.9 K/uL (ref 1.5–6.5)
Neutrophils Relative %: 74 %
Platelet Count: 167 K/uL (ref 145–400)
RBC: 4.38 MIL/uL (ref 3.70–5.45)
RDW: 13.2 % (ref 11.2–14.5)
WBC Count: 5.4 K/uL (ref 3.9–10.3)

## 2017-10-22 LAB — PROTIME-INR
INR: 1.17
Prothrombin Time: 14.8 s (ref 11.4–15.2)

## 2017-10-22 LAB — APTT: aPTT: 34 seconds (ref 24–36)

## 2017-10-22 MED ORDER — LORAZEPAM 0.5 MG PO TABS: 0.5000 mg | ORAL_TABLET | Freq: Four times a day (QID) | ORAL | 0 refills | Status: DC | PRN

## 2017-10-22 MED ORDER — FENTANYL CITRATE (PF) 100 MCG/2ML IJ SOLN
50.0000 ug | Freq: Once | INTRAMUSCULAR | Status: AC
  Administered 2017-10-22: 50 ug via INTRAVENOUS
  Filled 2017-10-22: qty 2

## 2017-10-22 MED ORDER — HYDROMORPHONE HCL 1 MG/ML IJ SOLN
1.0000 mg | Freq: Once | INTRAMUSCULAR | Status: AC
  Administered 2017-10-22: 1 mg via INTRAVENOUS
  Filled 2017-10-22: qty 1

## 2017-10-22 MED ORDER — SODIUM CHLORIDE 0.9 % IV BOLUS (SEPSIS)
1000.0000 mL | Freq: Once | INTRAVENOUS | Status: AC
  Administered 2017-10-22: 1000 mL via INTRAVENOUS

## 2017-10-22 MED ORDER — LIDOCAINE-PRILOCAINE 2.5-2.5 % EX CREA: TOPICAL_CREAM | CUTANEOUS | 3 refills | Status: DC

## 2017-10-22 MED ORDER — PROCHLORPERAZINE MALEATE 10 MG PO TABS: 10.0000 mg | ORAL_TABLET | Freq: Four times a day (QID) | ORAL | 1 refills | Status: DC | PRN

## 2017-10-22 MED ORDER — OXYCODONE HCL 5 MG PO TABS: 5.0000 mg | ORAL_TABLET | ORAL | 0 refills | Status: DC | PRN

## 2017-10-22 MED ORDER — OXYCODONE HCL ER 15 MG PO T12A: 15.0000 mg | EXTENDED_RELEASE_TABLET | Freq: Two times a day (BID) | ORAL | 0 refills | Status: DC

## 2017-10-22 MED ORDER — DEXAMETHASONE 4 MG PO TABS: 4.0000 mg | ORAL_TABLET | Freq: Every day | ORAL | 0 refills | Status: DC

## 2017-10-22 NOTE — ED Provider Notes (Signed)
Patient seen/examined in the Emergency Department in conjunction with Midlevel Provider  Patient reports worsening back pain.  No new weakness.  No incontinence.  Patient with known history of cancer. Exam : Awake alert, appears anxious, moves all extremities x4 Plan: We will treat pain and reassess. On My assessment, blood pressure is above 100   Ripley Fraise, MD 10/22/17 [phone]

## 2017-10-22 NOTE — Discharge Instructions (Signed)
You were seen in the emergency department for back and right hip pain.  You received pain medication and fluids while in the emergency department.  Continue to take your pain medication as prescribed and be sure to follow-up with your oncologist as scheduled this afternoon.  Return to the emergency department for any new or worsening symptoms.

## 2017-10-22 NOTE — ED Triage Notes (Signed)
Pt was just released from the hospital for dehydration Last Friday she had a live biopsy and has an appt today to find out if it's cancer Pt states that her lower back has been hurting all day but it's she been able to control it This am she said it became unbearable

## 2017-10-22 NOTE — Progress Notes (Signed)
HEMATOLOGY/ONCOLOGY CONSULTATION NOTE  Date of Service: 10/22/2017  Patient Care Team: [facility], Modena Nunnery, MD as PCP - General (Family Medicine)  CHIEF COMPLAINTS/PURPOSE OF CONSULTATION:   F/u for further evaluation and management of newly diagnosed extensive stage small cell lung cancer.  HISTORY OF PRESENTING ILLNESS:   Haley [name] is a wonderful 67 y.o. female who has been referred to [place] by Orthopedists Dr. Vickki Hearing for evaluation and management of Bone Marrow Abnormality. Her PCP is Dr. Buelah Manis.   She presents to the clinic today accompanied by her sons and her daughter-in-law. She notes she has several issues since October 2018.  She had colitis in 05/2017 with significant diarrhea and abdominal cramping --now mostly resolved.  She was treated with antibiotics. Afterward she had bronchitis. She was given albuterol and continues to take it as she has remaining cough (smoking contributed to this).   On 08/07/17 she notes she has back pain in her lower back the past couple of months. 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 Next day she saw her physician, who had done a xray and told her she had arthritis. She was treated with prednisone and gabapentin. After no back pain improvements on treatment, she has yet to return to work. She went to orthopedist and had a Lumbar MRI on 09/25/17. This shows a compression fracture of the L2 with abnormalities in her bone marrow.   She quit smoking 09/24/17. She had been smoking 1 pack a day for 50 years, since she was 67 yo. She still has cough from smoking but not as significant.   She denies heart conditions or disease, GI issues or diseases or other significant medical issues. DM runs in her family but no known cancers, blood disorders, or gene mutations. Prior to her health in the past 6 months she has lead a independent overall healthy life. Her last mammogram in 04/2017 was negative. Last colonoscopy in 2015 and  last Papsmear in 2017, were negative. She had a bone density scan in 2009. She has never been on HR therapy.   Given her significant pain (up to 10/10). She currently takes hydrocodone 1 tablet q4-5hours. This only reduced her pain to 7-8/10. She takes Tizanidine 30m q5-6hours. She has a back brace that she does not use.   On review of symptoms, pt notes ongoing b/l hip pain, lower back pain which can radiate up the side and down her legs and buttocks. The pain can keep her up at night. She notes when she lays on her right side, her throat pain which causes her to cough. This started around time of bronchitis and has not improved. When she does cough she produces clear phlegm.    Interval History:   STEILA SKALSKYreturns today regarding her newly diagnosed metastatic small cell lung cancer. The patient's last visit with [place] on 10[place]/17. She is accompanied today by her two sons and daughter-in-law. The pt reports that she is doing well overall.  I discussed with Dr OPaulita Fujita- patient has been given referral and [facility].   Of note since the patient last visit, pt has had liver needle/core biopsy completed on 10/17/17 with results revealing SMALL CELL CARCINOMA with The malignant cells are strongly positive for CD56 and faintly positive for chromogranin, cytokeratin AE1/AE3, and synaptophysin. They are also strongly positive for TTF-1. They are negative for CDX-2, cytokeratin 20,  cytokeratin 7, and Napsin-A. The histologic features, as well as this immunohistochemical profile is consistent with small carcinoma and the strong TTF-1 staining suggests a lung primary. Radiologic correlation is necessary, however.  On 10/15/17 an MR Abdomen MRCP was conducted yielding the following findings: 1. 3.4 cm pancreatic head mass is obstructing the common bile duct, which measures up to 1.3 cm in diameter. There is also moderate intrahepatic biliary  dilatation. The mass also causes truncation of the dorsal pancreatic duct which is mildly dilated. 2. Approximately 20 metastatic lesions are identified in the liver. 3. Right lower lobe airspace opacity and atelectasis. Suspected right infrahilar mass. 4. Small right and trace left pleural effusions. 5. Small left adrenal mass is suspicious for a metastatic lesion. 6. Scattered widespread osseous metastatic disease. 7. Other imaging findings of potential clinical significance: Aortic Atherosclerosis (ICD10-I70.0). L2 compression fracture as shown on prior MRI. Mild upper abdominal ascites with some trace edema along the mesentery and along several organs. Suspected gallbladder sludge with borderline distended gallbladder.  She notes that her right hip is exceptionally painful and has begun radiating into her right leg. She took 2 Oxycodone  in the morning without pain relief, and presented to the ED this morning. She notes that her pain jumps and shoots that she describes as a jabbing pain. She notes that she takes her pain meds every 3-4 hours, taking 10-12 pills each day. She notes that she has been taking Senna S each day.  Appears more jaundices that during last visit.  Lab results today (10/22/17) of CBC, CMP, and Reticulocytes is as follows: all values are WNL except for Lymphs Abs at 0.6k, Sodium at 124, Chloride at 89, Albumin at 2.7, AST at 257, ALT at 201, Alkaline Phosphatase at 457, Total Bilirubin at 8.9.  On review of systems, pt reports severe right hip pain, intermittent moments of shortness of breath, jaundice, constipation and denies any other symptoms.    MEDICAL HISTORY:  Past Medical History:  Diagnosis Date  . Allergy   . Osteopenia     SURGICAL HISTORY: Past Surgical History:  Procedure Laterality Date  . COLONOSCOPY    . COLONOSCOPY WITH PROPOFOL N/A 10/25/2014   Procedure: COLONOSCOPY WITH PROPOFOL;  Surgeon: Garlan Fair, MD;  Location: [facility]COPY;   Service: Endoscopy;  Laterality: N/A;  . ERCP N/A 10/15/2017   Procedure: ENDOSCOPIC RETROGRADE CHOLANGIOPANCREATOGRAPHY (ERCP);  Surgeon: Clarene Essex, MD;  Location: [facility];  Service: Endoscopy;  Laterality: N/A;  . IR RADIOLOGIST EVAL & MGMT  10/02/2017    SOCIAL HISTORY: Social History   Socioeconomic History  . Marital status: Single    Spouse name: Not on file  . Number of children: Not on file  . Years of education: Not on file  . Highest education level: Not on file  Social Needs  . Financial resource strain: Not on file  . Food insecurity - worry: Not on file  . Food insecurity - inability: Not on file  . Transportation needs - medical: Not on file  . Transportation needs - non-medical: Not on file  Occupational History  . Not on file  Tobacco Use  . Smoking status: Former Smoker    Packs/day: 1.00    Years: 40.00    Pack years: 40.00    Types: Cigarettes    Last attempt to quit: 09/24/2017    Years since quitting: 0.0  . Smokeless tobacco: Never Used  Substance and Sexual Activity  . Alcohol use: Yes  Comment: rare social  . Drug use: No  . Sexual activity: Not Currently  Other Topics Concern  . Not on file  Social History Narrative  . Not on file    FAMILY HISTORY: Family History  Problem Relation Age of Onset  . Diabetes Mother   . Hypertension Mother   . Diabetes Sister   . Hypertension Sister   . Hyperlipidemia Sister     ALLERGIES:  has No Known Allergies.  MEDICATIONS:  Current Outpatient Medications  Medication Sig Dispense Refill  . albuterol (PROVENTIL HFA;VENTOLIN HFA) 108 (90 Base) MCG/ACT inhaler Inhale 2 puffs into the lungs every 6 (six) hours as needed for wheezing or shortness of breath. 1 Inhaler 2  . ondansetron (ZOFRAN) 4 MG tablet Take 1 tablet (4 mg total) by mouth every 8 (eight) hours as needed for nausea or vomiting. 20 tablet 0  . oxyCODONE (OXY IR/ROXICODONE) 5 MG immediate release tablet Take 1-2 tablets (5-10 mg  total) by mouth every 4 (four) hours as needed for severe pain. 50 tablet 0  . polyethylene glycol (MIRALAX) packet Take 17 g by mouth daily as needed for moderate constipation. 30 each 0  . senna-docusate (SENNA S) 8.6-50 MG tablet Take 2 tablets by mouth at bedtime. 60 tablet 1  . tiZANidine (ZANAFLEX) 4 MG tablet Take 1 tablet (4 mg total) by mouth every 8 (eight) hours as needed for muscle spasms. 30 tablet 0   No current facility-administered medications for this visit.     REVIEW OF SYSTEMS:    .10 Point review of Systems was done is negative except as noted above.    PHYSICAL EXAMINATION: ECOG PERFORMANCE STATUS: 2 - Symptomatic, <50% confined to bed  . Vitals:   10/22/17 1532  BP: 98/71  Pulse: 85  Resp: 18  Temp: (!) 97.5 F (36.4 C)  SpO2: 98%   Filed Weights   10/22/17 1532  Weight: 115 lb 4.8 oz (52.3 kg)   .Body mass index is 20.42 kg/m.  Marland Kitchen GENERAL:alert, in no acute distress and comfortable SKIN: no acute rashes, no significant lesions EYES: conjunctiva pallor and significant icterus. OROPHARYNX: MMM, no exudates, no oropharyngeal erythema or ulceration NECK: supple, no JVD LYMPH:  no palpable lymphadenopathy in the cervical, axillary or inguinal regions LUNGS: clear to auscultation b/l with normal respiratory effort HEART: regular rate & rhythm ABDOMEN:  normoactive bowel sounds , non tender, not distended. Extremity: no pedal edema PSYCH: alert ,  NEURO: no focal motor/sensory deficits  LABORATORY DATA:   10/17/17 Liver needle/core biopsy    RADIOGRAPHIC STUDIES: I have personally reviewed the radiological images as listed and agreed with the findings in the report. Dg Chest 2 View  Result Date: 10/05/2017 CLINICAL DATA:  Chest pain and shortness of breath EXAM: CHEST  2 VIEW COMPARISON:  Lumbar MRI September 24, 2017 FINDINGS: There is consolidation in the medial right base. Lungs elsewhere are clear. Heart size and pulmonary vascularity are  normal. No adenopathy. No pneumothorax. There is a burst type fracture of the L2 vertebral body, present on recent MR. IMPRESSION: Consolidation medial right base. Lungs elsewhere clear. No adenopathy. Burst type fracture L2 vertebral body, present on prior MR. Electronically Signed   By: Lowella Grip III M.D.   On: 10/05/2017 21:56   Ct Angio Chest Pe W And/or Wo Contrast  Result Date: 10/05/2017 CLINICAL DATA:  Pt c/o upper R chest pain, difficulty breathing, and cough. She has been experiencing lower back pain over the last few  weeks and was dx'd with a crushed vertebrae and possibly lung cancer. EXAM: CT ANGIOGRAPHY CHEST WITH CONTRAST TECHNIQUE: Multidetector CT imaging of the chest was performed using the standard protocol during bolus administration of intravenous contrast. Multiplanar CT image reconstructions and MIPs were obtained to evaluate the vascular anatomy. CONTRAST:  <See Chart> ISOVUE-370 IOPAMIDOL (ISOVUE-370) INJECTION 76% COMPARISON:  Current chest radiographs FINDINGS: Cardiovascular: Satisfactory opacification of the pulmonary arteries to the segmental level. No evidence of pulmonary embolism. Normal heart size. No pericardial effusion. There are mild, 3 vessel coronary artery calcifications. Aorta is normal in caliber. No dissection. Mild atherosclerotic disease noted along the arch and descending thoracic aorta. Mediastinum/Nodes: No neck base or axillary masses or adenopathy. Visualized thyroid is in appearance. Borderline enlarged right paratracheal, azygos level lymph node measuring 11 mm short axis. There is abnormal soft tissue that extends along the inferior right hilum and subcarinal mediastinum to surround the bronchus intermedius in occludes the more distal bronchus intermedius and right middle lobe bronchus. Lungs/Pleura: Complete atelectasis of the right lower lobe. With near complete atelectasis of the right middle lobe. Moderate to advanced centrilobular emphysema. No  discrete lung masses or nodules. No evidence of pneumonia or pulmonary edema. Small right pleural effusion. No pneumothorax. Upper Abdomen: Low-density liver lesions, 3 total seen, along the right lobe, measuring from 16 mm to 20 mm. Left adrenal mass measuring 16 mm. No acute findings in the upper abdomen. Musculoskeletal: No fracture or acute finding. No osteoblastic or osteolytic lesions. Review of the MIP images confirms the above findings. IMPRESSION: 1. No evidence of a pulmonary embolism. 2. Abnormal soft tissue, consistent with malignancy, surrounds the inferior right hilar structures and occludes the bronchus intermedius leading to complete atelectasis of the right lower lobe and significant atelectasis of the right middle lobe. There is a single borderline enlarged right paratracheal lymph node measuring 11 mm in short axis. 3. Three liver lesions are seen suspicious for metastatic disease. Enlarged left adrenal gland is also suspicious for metastatic disease. 4. No convincing pneumonia or evidence of pulmonary edema. 5. Moderate to advanced emphysema. Aortic Atherosclerosis (ICD10-I70.0) and Emphysema (ICD10-J43.9). Electronically Signed   By: Lajean Manes M.D.   On: 10/05/2017 23:01   Mr Lumbar Spine W Wo Contrast  Result Date: 10/06/2017 CLINICAL DATA:  66 y/o F; bone metastasis with pathologic fracture of L2 vertebral body. Numbness and tingling beginning to radiate down her legs. EXAM: MRI LUMBAR SPINE WITHOUT AND WITH CONTRAST TECHNIQUE: Multiplanar and multiecho pulse sequences of the lumbar spine were obtained without and with intravenous contrast. CONTRAST:  75m MULTIHANCE GADOBENATE DIMEGLUMINE 529 MG/ML IV SOLN COMPARISON:  09/24/2017 MRI of the lumbar spine. 10/05/2017 CT of the chest. FINDINGS: Segmentation:  Standard. Alignment:  Physiologic. Vertebrae: Diffuse low T1 signal with mild enhancement throughout the visible lumbar spine and sacrum patchy areas of fatty marrow sparing. Stable  moderate compression deformity of L2 vertebral body with 7 mm retropulsion of superior endplate. Stable mild irregular enhancement of the L2 vertebral body. No new compression deformity. No epidural disease. Conus medullaris and cauda equina: Conus extends to the L1 level. Conus and cauda equina appear normal. Paraspinal and other soft tissues: Numerous masses throughout the liver measuring up to 23 mm in segment 2 and ill-defined nodule in left adrenal gland, likely metastatic disease. Disc levels: L1-2: Retropulsion of L2 superior endplate results in stable moderate canal stenosis. No foraminal stenosis. L2-3: No significant disc displacement, foraminal stenosis, or canal stenosis. L3-4: Small disc bulge with  mild facet and ligamentum flavum hypertrophy. No significant foraminal or canal stenosis. L4-5: Small disc bulge with moderate facet hypertrophy. No significant foraminal or canal stenosis. L5-S1: Disc bulge with endplate marginal osteophytes eccentric to the left and mild bilateral facet hypertrophy. Moderate left foraminal stenosis. No canal stenosis. IMPRESSION: 1. Stable L2 compression deformity with 7 mm retropulsion superior endplate resulting in moderate canal stenosis. 2. No abnormal enhancement of the visible cord or cauda equina. No new fracture. No epidural metastatic disease. 3. Diffuse decreased T1 signal throughout the visible spine and sacrum compatible with a fatty marrow replacement process which may be red marrow, neoplasm, or chemotherapy related response. 4. Stable lumbar spondylosis greatest at L5-S1 level where there is moderate left foraminal stenosis. 5. Multiple hepatic masses and left adrenal mass compatible with metastatic disease. Electronically Signed   By: Kristine Garbe M.D.   On: 10/06/2017 23:47   Mr 3d Recon At Scanner  Result Date: 10/15/2017 CLINICAL DATA:  Metastatic carcinoma of uncertain primary. Abnormal liver function tests and jaundice. EXAM: MRI ABDOMEN  WITHOUT AND WITH CONTRAST (INCLUDING MRCP) TECHNIQUE: Multiplanar multisequence MR imaging of the abdomen was performed both before and after the administration of intravenous contrast. Heavily T2-weighted images of the biliary and pancreatic ducts were obtained, and three-dimensional MRCP images were rendered by post processing. CONTRAST:  51m MULTIHANCE GADOBENATE DIMEGLUMINE 529 MG/ML IV SOLN COMPARISON:  PET-CT dated 10/09/2017 and CT abdomen from 05/24/2017 FINDINGS: Lower chest: Airspace opacity and some volume loss in the right lower lobe is again observed with a suspected right infrahilar mass only partially seen today. Borderline cardiomegaly. Small right and trace left pleural effusions. Hepatobiliary: Moderate intrahepatic biliary dilatation with CBD dilatation up to 1.3 cm in diameter, and abrupt truncation of the common bile duct in the vicinity of the pancreatic head mass. There are approximately 20 hypoenhancing masses scattered in the liver. One lesion in segment 3 of the liver measures 2.7 by 2.0 cm, previously 2.6 by 2.0 cm on 10/09/2017 by my measurement. There is mild gallbladder wall thickening with accentuated precontrast T1 signal in the gallbladder favoring sludge. Pancreas: Hypoenhancing mass in the head of the pancreas measures proximally 3.4 by 3.1 cm on image [id number]. Mildly dilated dorsal pancreatic duct extending to the margin of this mass as shown on image 25/401. Trace peripancreatic edema. Spleen:  Unremarkable.  Trace perisplenic ascites. Adrenals/Urinary Tract: 1.1 by 1.8 cm left adrenal mass was previously hypermetabolic, and probably represents a metastatic lesion. The kidneys appear unremarkable. Trace right greater than left perirenal stranding. Stomach/Bowel: No dilated bowel identified. Vascular/Lymphatic:  Aortoiliac atherosclerotic vascular disease. Other:  No supplemental non-categorized findings. Musculoskeletal: Scattered widespread osseous metastatic lesions including  the sternum, ribs, spine, and bony pelvis. L2 compression fracture with associated enhancement, and possibly acute component of fracture. IMPRESSION: 1. 3.4 cm pancreatic head mass is obstructing the common bile duct, which measures up to 1.3 cm in diameter. There is also moderate intrahepatic biliary dilatation. The mass also causes truncation of the dorsal pancreatic duct which is mildly dilated. 2. Approximately 20 metastatic lesions are identified in the liver. 3. Right lower lobe airspace opacity and atelectasis. Suspected right infrahilar mass. 4. Small right and trace left pleural effusions. 5. Small left adrenal mass is suspicious for a metastatic lesion. 6. Scattered widespread osseous metastatic disease. 7. Other imaging findings of potential clinical significance: Aortic Atherosclerosis (ICD10-I70.0). L2 compression fracture as shown on prior MRI. Mild upper abdominal ascites with some trace edema along the mesentery and  along several organs. Suspected gallbladder sludge with borderline distended gallbladder. Electronically Signed   By: Van Clines M.D.   On: 10/15/2017 09:55   Nm Pet Image Initial (pi) Skull Base To Thigh  Result Date: 10/09/2017 CLINICAL DATA:  Initial treatment strategy for metastatic disease of unknown primary. EXAM: NUCLEAR MEDICINE PET SKULL BASE TO THIGH TECHNIQUE: 6.3 mCi F-18 FDG was injected intravenously. Full-ring PET imaging was performed from the skull base to thigh after the radiotracer. CT data was obtained and used for attenuation correction and anatomic localization. FASTING BLOOD GLUCOSE:  Value: 95 mg/dl COMPARISON:  CT chest 10/05/2017.  Abdomen and pelvis CT 04/23/2017. FINDINGS: NECK: No hypermetabolic lymph nodes in the neck. CHEST: Mediastinal lymphadenopathy seen on recent CT chest is hypermetabolic. The subcarinal lymph node is measures an index with SUV max = 9.1. Mass lesion in the inferior right hilum is hypermetabolic with SUV max = 9.9.  Hypermetabolic lymph nodes are seen in the thoracic inlet bilaterally, in the paratracheal space, and in the right hilum. Tiny right pleural effusion noted. Emphysema noted in the lungs bilaterally. ABDOMEN/PELVIS: Multiple hypermetabolic liver metastases are evident. 3.0 cm lesion identified in the subcapsular liver on image 101 demonstrates SUV max = 8.3. Other scattered hypermetabolic liver metastases are evident. 2.4 cm lesion lateral segment left liver on image 118 demonstrates SUV max = 8.8. 3.6 x 3.9 cm soft tissue mass likely in the anterior head of pancreas is hypermetabolic with SUV max = 8.1. Layering high attenuation in the gallbladder lumen likely vicarious excretion of contrast from recent abdomen and pelvis CT as no gallstones were evident on 05/24/2017. Also possible that this represents sludge or interval development of innumerable tiny mineralized gallstones. Mild intra and extrahepatic biliary duct dilatation is evident. Extrahepatic common duct measures 9 mm diameter and the common bile duct appears obliterated at the level of the above-described pancreatic head lesion. There is abdominal aortic atherosclerosis without aneurysm. SKELETON: Multiple hypermetabolic bone metastases are evident. Hypermetabolic lesion in the left humeral head demonstrates SUV max = 6.5. Multiple hypermetabolic metastases are seen in the spine. L3 lesion demonstrates SUV max = 8.7. Hypermetabolic lesion in the left acetabulum demonstrates SUV max = 7.5 left femoral neck lesion demonstrates SUV max = 8.5. IMPRESSION: 1. Hypermetabolic lymphadenopathy is identified in the thoracic inlet bilaterally, mediastinum, and right hilum with associated hypermetabolic disease in the right infrahilar region. Additionally, hypermetabolic liver metastases, diffuse bone metastases and a 3.9 cm hypermetabolic mass in the head of the pancreas are evident. Imaging findings may well be related to primary pancreatic adenocarcinoma with  widespread metastatic involvement. Infrahilar right lung cancer with metastatic disease (including to the pancreas) is also a consideration. No pancreatic mass lesion was visible on the CT scan from 05/24/2017, even in retrospect. There is associated mild intra and extrahepatic biliary duct dilatation. EUS and/or abdominal MRI/MRCP may prove helpful to further evaluate. 2.  Aortic Atherosclerois (ICD10-170.0) 3.  Emphysema. ([id number]-T26.9) Electronically Signed   By: Misty Stanley M.D.   On: 10/09/2017 17:22   US Biopsy (liver)  Result Date: 10/17/2017 INDICATION: PANCREAS MASS WITH LIVER METASTASES EXAM: ULTRASOUND GUIDED CORE BIOPSY OF LEFT HEPATIC MASS MEDICATIONS: 1% lidocaine local ANESTHESIA/SEDATION: Fentanyl 50 mcg IV; Versed 1.0 mg IV Moderate Sedation Time:  10 minutes The patient was continuously monitored during the procedure by the interventional radiology nurse under my direct supervision. PROCEDURE: The procedure, risks, benefits, and alternatives were explained to the patient. Questions regarding the procedure were encouraged and answered. The  patient understands and consents to the procedure. Previous imaging reviewed. Preliminary ultrasound performed. A left hepatic hypoechoic solid mass was demonstrated in the epigastric region. Overlying skin marked. The epigastric region was prepped with ChloraPrep in a sterile fashion, and a sterile drape was applied covering the operative field. A sterile gown and sterile gloves were used for the procedure. Local anesthesia was provided with 1% Lidocaine. Under sterile conditions and local anesthesia, a 17 gauge 6.8 cm access needle was advanced from an epigastric percutaneous approach to the left hepatic mass. Needle position confirmed with ultrasound. 3 18 gauge core biopsies obtained through the lesion. Images obtained for documentation. Samples placed in formalin. Needle tract occlusion performed with Gel-Foam. Postprocedure imaging demonstrates no  hemorrhage or hematoma. Patient tolerated the biopsy well. COMPLICATIONS: None immediate. FINDINGS: Imaging confirms needle placed in the left hepatic mass in the lateral segment anteriorly. IMPRESSION: Successful CT-guided left hepatic mass 18 gauge core biopsy Electronically Signed   By: Jerilynn Mages.  Shick M.D.   On: 10/17/2017 13:19   Dg Ercp Biliary & Pancreatic Ducts  Result Date: 10/15/2017 CLINICAL DATA:  Unsuccessful attempted ERCP EXAM: ERCP TECHNIQUE: Multiple spot images obtained with the fluoroscopic device and submitted for interpretation post-procedure. COMPARISON:  MRCP - 10/15/2017 FLUOROSCOPY TIME:  25 minutes, 45 seconds (175.39 mGy) FINDINGS: There are 4 spot intraoperative fluoroscopic images of the right upper abdominal quadrant provided for review Initial image demonstrates an ERCP probe overlying the right upper abdominal quadrant. There is an additional thin drainage catheter overlying the expected location of the pancreatic duct. Subsequent images demonstrate contrast injection without definitive opacification of a recognizable feature of the biliary system. IMPRESSION: 1. Attempted ERCP as above. 2. Additional thin drainage catheter overlies expected location of the pancreatic duct. Above discussed with Dr. Watt Climes prior to completion of the procedure Electronically Signed   By: Sandi Mariscal M.D.   On: 10/15/2017 14:08   Mr Abdomen Mrcp Moise Boring Contast  Result Date: 10/15/2017 CLINICAL DATA:  Metastatic carcinoma of uncertain primary. Abnormal liver function tests and jaundice. EXAM: MRI ABDOMEN WITHOUT AND WITH CONTRAST (INCLUDING MRCP) TECHNIQUE: Multiplanar multisequence MR imaging of the abdomen was performed both before and after the administration of intravenous contrast. Heavily T2-weighted images of the biliary and pancreatic ducts were obtained, and three-dimensional MRCP images were rendered by post processing. CONTRAST:  79m MULTIHANCE GADOBENATE DIMEGLUMINE 529 MG/ML IV SOLN  COMPARISON:  PET-CT dated 10/09/2017 and CT abdomen from 05/24/2017 FINDINGS: Lower chest: Airspace opacity and some volume loss in the right lower lobe is again observed with a suspected right infrahilar mass only partially seen today. Borderline cardiomegaly. Small right and trace left pleural effusions. Hepatobiliary: Moderate intrahepatic biliary dilatation with CBD dilatation up to 1.3 cm in diameter, and abrupt truncation of the common bile duct in the vicinity of the pancreatic head mass. There are approximately 20 hypoenhancing masses scattered in the liver. One lesion in segment 3 of the liver measures 2.7 by 2.0 cm, previously 2.6 by 2.0 cm on 10/09/2017 by my measurement. There is mild gallbladder wall thickening with accentuated precontrast T1 signal in the gallbladder favoring sludge. Pancreas: Hypoenhancing mass in the head of the pancreas measures proximally 3.4 by 3.1 cm on image 62/11105. Mildly dilated dorsal pancreatic duct extending to the margin of this mass as shown on image 25/401. Trace peripancreatic edema. Spleen:  Unremarkable.  Trace perisplenic ascites. Adrenals/Urinary Tract: 1.1 by 1.8 cm left adrenal mass was previously hypermetabolic, and probably represents a metastatic lesion. The  kidneys appear unremarkable. Trace right greater than left perirenal stranding. Stomach/Bowel: No dilated bowel identified. Vascular/Lymphatic:  Aortoiliac atherosclerotic vascular disease. Other:  No supplemental non-categorized findings. Musculoskeletal: Scattered widespread osseous metastatic lesions including the sternum, ribs, spine, and bony pelvis. L2 compression fracture with associated enhancement, and possibly acute component of fracture. IMPRESSION: 1. 3.4 cm pancreatic head mass is obstructing the common bile duct, which measures up to 1.3 cm in diameter. There is also moderate intrahepatic biliary dilatation. The mass also causes truncation of the dorsal pancreatic duct which is mildly  dilated. 2. Approximately 20 metastatic lesions are identified in the liver. 3. Right lower lobe airspace opacity and atelectasis. Suspected right infrahilar mass. 4. Small right and trace left pleural effusions. 5. Small left adrenal mass is suspicious for a metastatic lesion. 6. Scattered widespread osseous metastatic disease. 7. Other imaging findings of potential clinical significance: Aortic Atherosclerosis (ICD10-I70.0). L2 compression fracture as shown on prior MRI. Mild upper abdominal ascites with some trace edema along the mesentery and along several organs. Suspected gallbladder sludge with borderline distended gallbladder. Electronically Signed   By: Van Clines M.D.   On: 10/15/2017 09:55   Ir Radiologist Eval & Mgmt  Result Date: 10/02/2017 Please refer to notes tab for details about interventional procedure. (Op Note)  Mr Outside Films Spine  Result Date: 10/01/2017 This examination belongs to an outside facility and is stored here for comparison purposes only.  Contact the originating outside institution for any associated report or interpretation.   MRI Lumbar Spine 09/25/17  IMPRESSION:  1. Extensive marrow signal abnormality throughout the osseous structures of the visualized thoracolumbar spine, sacrum, and iliac bones concerning for metastases, multiple myeloma, or other nonspecific marrow infiltrative disorder. Suggest obtaining a PET/CT to evaluate extent of disease/evaluate for a primary tumor.  2. L2 compression fracture, concerning for pathologic fracture given the extensive marrow abnormality with approximately 40% vertebral body height loss and 8.68m of retropulsion resulting in moderate narrowing of the spinal canal. A postcontrast MRI with fat saturation would help to evaluate the enhancements pattern of the bony disease and evaluate for potential abnormal soft issue extending beyond the cortex which is not definitively evident in this exam.  3. Moderate facet  arthrophy with grade 1 anterolisthesis at L4-5.  Degenerative disc disease with moderate left foraminal stenosis at L5-S1.     ASSESSMENT & PLAN:   Haley VOLKis a 67y.o. caucasian female with    1. 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  Liver mets + Bone mets + adrenal mets Plan: -Discussed pt labwork today; AST at 257, ALT at 201, Alkaline Phosphatase at 457, Total Bilirubin at 8.9. -Discussed 10/15/17 MRI  -Discussed that the diagnosis is not surgically operable for treatment of the patient small cell carcinoma.. -Since the pt is in the extensive stage, we will need to get a brain MRI to r/o brain metastasis and as part of  Small cell lung cancer initial staging. -Provided supplemental information to pt and her family  -Will order a Port placement.  -Recommend that we start treatment early next week. -Discussed Carboplatin as an option, and the need to continually monitor her liver functions. -Will set pt up for chemotherapy counseling -Carboplatin/Etoposide/Atezolizumab on Monday 10/27/2017 with neulasta -Xgeva q4weeks  2. 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 Back pain related to pathologic L2 fracture -continue using back brace -continue prn Oxycodone with GI prophylaxis -will hold of on interventional procedure to L2 at this time. -Per patient's clinical presentation;  her hip and leg pain is likely nerve pinching due to compression fractures.  -Discussed treating her back pain with steroids.  -Advised staying off her feet considering her left femoral neck lesion and acetabulum.  -Adding Oxycontin, and keeping short term Oxycodone prn. -Opiate related constipation: advised increasing Senna S to twice a day and using Miralax as well; one time OTC Magnesium Citrate if she still cannot move bowels.    5. Smoking history, productive cough  Smoked for 50 years, 1 pack a day  Has not had a Chest Xray or scan since 2008 She currently has moderate cough with clear phlegm ever since episode of bronchitis. She takes uses albuterol inhaler as needed.  Plan  Smoking cessation  6. Extra-hepatic biliary obstruction from her biliary tumor -GI consult on 10/24/17 preference for a stent put in with ERCP on 10/24/17 if accessible.   7. Moderate to sever protein malnutrition due to weight loss -nutritional consultation   Port a cath placement 1-2 days MRI brain 1-2 days Labs on 10/27/2017 Carboplatin/Etoposide/Atezolizumab on Monday 10/27/2017 with neulasta Xgeva in 1 week Chemo-counseling on Monday prior chemotherapy RTC with Dr Irene Limbo in on Monday (can see in infusion if needed) with labs   All of the patients questions were answered with apparent satisfaction. The patient knows to call the clinic with any problems, questions or concerns.  . The total time spent in the appointment was 40 minutes and more than 50% was on counseling and direct patient cares.    Sullivan Lone MD [place] AAHIVMS [facility] Hematology/Oncology Physician [facility]  (Office):       [phone] (Work cell):  [phone] (Fax):           [phone]  10/22/2017 8:46 AM  This document serves as a record of services personally performed by Sullivan Lone, MD. It was created on his behalf by Baldwin Jamaica, a trained medical scribe. The creation of this record is based on the scribe's  personal observations and the provider's statements to them.   .I have reviewed the above documentation for accuracy and completeness, and I agree with the above. Brunetta Genera MD MS

## 2017-10-22 NOTE — ED Provider Notes (Signed)
[place] DEPT Provider Note   CSN: [id number] Arrival date & time: 10/22/17  0506     History   Chief Complaint No chief complaint on file.   HPI Haley [name] is a 67 y.o. 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 Patient states this pain is the same quality and in the same location as her typical pain- relays it seems worse since 1500 yesterday. Rates pain an 8/10 in severity- worse with movement and in the seated position, no specific alleviating factors- tried taking two percocet at midnight and 3AM both times without relief. States she had dx of stage IV carcinoma unknown primary source- possible pancreatic vs. Lung with mets to the liver. She had a liver biopsy 5 days ago and is pending results this afternoon with appointment with her oncologist Dr. Irene Limbo. States they have done extensive imaging including imaging of her back. Denies numbness, tingling, weakness, incontinence to bowel/bladder, fever, chills, or IV drug use. Patient has been using a wheel chair at baseline.     HPI  Past Medical History:  Diagnosis Date  . Allergy   . Osteopenia     Patient Active Problem List   Diagnosis Date Noted  . Liver metastases ([place])   . 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 Metastatic disease ([place])   . Jaundice 10/13/2017  . Mood disorder ([place]) 05/12/2017  . Osteoarthritis 05/12/2017  . Osteopenia 05/06/2016  . Eustachian tube dysfunction 07/03/2015  . Allergic rhinitis 07/03/2015  . Tobacco use disorder 07/03/2015  . Hypertriglyceridemia 07/03/2015    Past Surgical History:  Procedure Laterality Date  . COLONOSCOPY    . COLONOSCOPY WITH PROPOFOL N/A 10/25/2014   Procedure: COLONOSCOPY WITH PROPOFOL;  Surgeon: Garlan Fair, MD;   Location: [facility] ENDOSCOPY;  Service: Endoscopy;  Laterality: N/A;  . ERCP N/A 10/15/2017   Procedure: ENDOSCOPIC RETROGRADE CHOLANGIOPANCREATOGRAPHY (ERCP);  Surgeon: Clarene Essex, MD;  Location: [facility];  Service: Endoscopy;  Laterality: N/A;  . IR RADIOLOGIST EVAL & MGMT  10/02/2017    OB History    No data available       Home Medications    Prior to Admission medications   Medication Sig Start Date End Date Taking? Authorizing Provider  ondansetron (ZOFRAN) 4 MG tablet Take 1 tablet (4 mg total) by mouth every 8 (eight) hours as needed for nausea or vomiting. 10/18/17  Yes Bonnielee Haff, MD  oxyCODONE (OXY IR/ROXICODONE) 5 MG immediate release tablet Take 1-2 tablets (5-10 mg total) by mouth every 4 (four) hours as needed for severe pain. 10/18/17  Yes Bonnielee Haff, MD  senna-docusate (SENNA S) 8.6-50 MG tablet Take 2 tablets by mouth at bedtime. 09/30/17  Yes Brunetta Genera, MD  tiZANidine (ZANAFLEX) 4 MG tablet Take 1 tablet (4 mg total) by mouth every 8 (eight) hours as needed for muscle spasms. 10/18/17  Yes Bonnielee Haff, MD  albuterol (PROVENTIL HFA;VENTOLIN HFA) 108 (90 Base) MCG/ACT inhaler Inhale 2 puffs into the lungs every 6 (six) hours as needed for wheezing or shortness of breath. 07/30/17   Dena Billet B, PA-C  polyethylene glycol [facility]) packet Take 17 g by mouth daily as needed for moderate constipation. 10/18/17   Bonnielee Haff, MD    Family History Family History  Problem  Relation Age of Onset  . Diabetes Mother   . Hypertension Mother   . Diabetes Sister   . Hypertension Sister   . Hyperlipidemia Sister     Social History Social History   Tobacco Use  . Smoking status: Former Smoker    Packs/day: 1.00    Years: 40.00    Pack years: 40.00    Types: Cigarettes    Last attempt to quit: 09/24/2017    Years since quitting: 0.0  . Smokeless tobacco: Never Used  Substance Use Topics  . Alcohol use: Yes    Comment: rare social  . Drug use: No     Allergies   Patient has no known allergies.   Review of Systems Review of Systems  Constitutional: Negative for chills and fever.  Gastrointestinal: Negative for abdominal pain.  Genitourinary: Negative for dysuria.  Musculoskeletal: Positive for back pain.  Skin: Positive for color change (jaundice, unchanged).  Neurological: Negative for weakness and numbness.       Negative for incontinence to bowel/bladder  All other systems reviewed and are negative.  Physical Exam Updated Vital Signs BP 90/69 (BP Location: Left Arm)   Pulse 66   Temp (!) 97.4 F (36.3 C) (Oral)   Resp (!) 24   SpO2 92%   Physical Exam  Constitutional:  Non-toxic appearance. She appears distressed (mild secondary to pain).  HENT:  Head: Normocephalic and atraumatic.  Eyes: Conjunctivae are normal. Right eye exhibits no discharge. Left eye exhibits no discharge.  Cardiovascular: Normal rate and regular rhythm.  Pulmonary/Chest: Effort normal.  Abdominal: Soft. There is no tenderness.  Musculoskeletal:  Back: Diffuse lumbar tenderness including midline and bilateral paraspinal muscle tenderness to palpation this extends to R hip diffusely. No point/focal tenderness. No overlying erythema, ecchymosis, or warmth.   Neurological: She is alert.  Clear speech. 5/5 strength with plantar/dorsi flexion bilaterally. Sensation grossly intact to bilateral lower extremities. Patient uses a wheel chair at baseline.   Skin: Skin is warm and dry.  Jaundice  Psychiatric: She has a normal mood and affect. Her behavior is normal. Thought content normal.  Nursing note and vitals reviewed.  ED Treatments / Results  Labs (all labs ordered are listed, but only abnormal results are displayed) Labs Reviewed - No data to display  EKG  EKG Interpretation None       Radiology No results found.   Prior Imaging Reviewed:  Lumbar MRI W WO Contrast 10/06/17:   IMPRESSION: 1. Stable L2 compression deformity with  7 mm retropulsion superior endplate resulting in moderate canal stenosis. 2. No abnormal enhancement of the visible cord or cauda equina. No new fracture. No epidural metastatic disease. 3. Diffuse decreased T1 signal throughout the visible spine and sacrum compatible with a fatty marrow replacement process which may be red marrow, neoplasm, or chemotherapy related response. 4. Stable lumbar spondylosis greatest at L5-S1 level where there is moderate left foraminal stenosis. 5. Multiple hepatic masses and left adrenal mass compatible with metastatic disease.  MR Abdomen MRCP W WO Contrast:  10/15/17 IMPRESSION: 1. 3.4 cm pancreatic head mass is obstructing the common bile duct, which measures up to 1.3 cm in diameter. There is also moderate intrahepatic biliary dilatation. The mass also causes truncation of the dorsal pancreatic duct which is mildly dilated. 2. Approximately 20 metastatic lesions are identified in the liver. 3. Right lower lobe airspace opacity and atelectasis. Suspected right infrahilar mass. 4. Small right and trace left pleural effusions. 5. Small left adrenal mass  is suspicious for a metastatic lesion. 6. Scattered widespread osseous metastatic disease. 7. Other imaging findings of potential clinical significance: Aortic Atherosclerosis (ICD10-I70.0). L2 compression fracture as shown on prior MRI. Mild upper abdominal ascites with some trace edema along the mesentery and along several organs. Suspected gallbladder sludge with borderline distended gallbladder.  Procedures Procedures (including critical care time)  Medications Ordered in ED Medications - No data to display   Initial Impression / Assessment and Plan / ED Course  I have reviewed the triage vital signs and the nursing notes.  Pertinent labs & imaging results that were available during my care of the patient were reviewed by me and considered in my medical decision making (see chart for  details).   Presents with acute on chronic back pain.  Patient with history of stage IV carcinoma without known primary source.  She has had recent hospital admission and recent extensive imaging including MRI of lumbar spine within the past 1 month.  She has appointment scheduled for follow-up with her oncologist Dr.Kale is afternoon, presented to the emergency department today for pain control. Patient has no neurological deficits and normal neuro exam. There is no point vertebral tenderness.  No loss of bowel or bladder control. No saddle anesthesia.  No concern for cauda equina.  Discussed with supervising physician Dr. Christy Gentles, who evaluated the patient and is in agreement that repeat imaging and labs is not necessary at this time, will pain control.  Will initiate treatment with fentanyl.  07:43: RE-EVAL: Patient's pain is unchanged after Fentanyl. Will order 1mg  Dilaudid.  08:28: RE-EVAL: Patient states significant improvement with Dilaudid, still uncomfortable. Requesting additional dose which has been ordered.    09:55: RE-EVAL: Patient states she feels much better, relays her pain is a 5/10 in severity which is baseline for her. She and her daughter are in agreement that she feels ready to go home and follow up with oncologist as scheduled.   I discussed continuation of her pain management regimen, need for oncology follow up as scheduled this afternoon, and return precautions with the patient and her daughter. Provided opportunity for questions, patient and her daughter confirmed understanding and are in agreement with plan.    Final Clinical Impressions(s) / ED Diagnoses   Final diagnoses:  Bilateral low back pain, unspecified chronicity, with sciatica presence unspecified    ED Discharge Orders    None       Amaryllis Dyke, PA-C 10/22/17 1000    Ripley Fraise, MD 10/22/17 2309

## 2017-10-22 NOTE — Progress Notes (Signed)
START ON PATHWAY REGIMEN - Small Cell Lung     Cycles 1 through 4, every 21 days:     Atezolizumab      Carboplatin      Etoposide    Cycles 5 and beyond, every 21 days:     Atezolizumab   **Always confirm dose/schedule in your pharmacy ordering system**    Patient Characteristics: Extensive Stage, First Line Stage Classification: Extensive AJCC T Category: TX AJCC N Category: NX AJCC M Category: M1c AJCC 8 Stage Grouping: Staged < 8th Ed. Line of therapy: First Line Would you be surprised if this patient died  in the next year<= I would NOT be surprised if this patient died in the next year Intent of Therapy: Non-Curative / Palliative Intent, Discussed with Patient

## 2017-10-23 ENCOUNTER — Encounter: Payer: Self-pay | Admitting: Hematology

## 2017-10-23 LAB — AFP TUMOR MARKER: AFP, Serum, Tumor Marker: 7.3 ng/mL (ref 0.0–8.3)

## 2017-10-23 LAB — CANCER ANTIGEN 19-9: CA 19-9: 390 U/mL — ABNORMAL HIGH (ref 0–35)

## 2017-10-23 LAB — CEA (IN HOUSE-CHCC): CEA (CHCC-In House): 8.9 ng/mL — ABNORMAL HIGH (ref 0.00–5.00)

## 2017-10-23 NOTE — Progress Notes (Signed)
Received PA determination for Oxycontin.  Submitted via Cover My Meds:  [facility] Key: [id number] - PA Case ID: [id number] - Rx #: [id number] Need help? Call us at [phone]  Outcome  Approvedtoday  Your PA request has been approved. Additional information will be provided in the approval communication. (Message 1145)  DrugOxyCONTIN 15MG  OR T12A  Therapist, sports PA Form  Original Claim [id number] PRECERT REQUIREDPRECERT REQ BY MD [id number] LIMITATIONS EXCEEDED  Called [facility](Cindy) to advise of approval. She states ok.

## 2017-10-27 ENCOUNTER — Inpatient Hospital Stay: Payer: No Typology Code available for payment source

## 2017-10-27 ENCOUNTER — Inpatient Hospital Stay: Payer: No Typology Code available for payment source | Attending: Hematology

## 2017-10-27 ENCOUNTER — Inpatient Hospital Stay (HOSPITAL_BASED_OUTPATIENT_CLINIC_OR_DEPARTMENT_OTHER): Payer: No Typology Code available for payment source | Admitting: Hematology

## 2017-10-27 ENCOUNTER — Other Ambulatory Visit: Payer: 59

## 2017-10-27 ENCOUNTER — Encounter: Payer: Self-pay | Admitting: Hematology

## 2017-10-27 VITALS — BP 110/66 | HR 68 | Temp 98.3°F | Resp 20 | Ht 63.0 in | Wt 112.3 lb

## 2017-10-27 DIAGNOSIS — F17211 Nicotine dependence, cigarettes, in remission: Secondary | ICD-10-CM | POA: Insufficient documentation

## 2017-10-27 DIAGNOSIS — C7889 Secondary malignant neoplasm of other digestive organs: Secondary | ICD-10-CM | POA: Insufficient documentation

## 2017-10-27 DIAGNOSIS — E46 Unspecified protein-calorie malnutrition: Secondary | ICD-10-CM

## 2017-10-27 DIAGNOSIS — D701 Agranulocytosis secondary to cancer chemotherapy: Secondary | ICD-10-CM | POA: Diagnosis not present

## 2017-10-27 DIAGNOSIS — M25552 Pain in left hip: Secondary | ICD-10-CM | POA: Insufficient documentation

## 2017-10-27 DIAGNOSIS — K831 Obstruction of bile duct: Secondary | ICD-10-CM

## 2017-10-27 DIAGNOSIS — E871 Hypo-osmolality and hyponatremia: Secondary | ICD-10-CM | POA: Diagnosis not present

## 2017-10-27 DIAGNOSIS — M79606 Pain in leg, unspecified: Secondary | ICD-10-CM

## 2017-10-27 DIAGNOSIS — Z7189 Other specified counseling: Secondary | ICD-10-CM

## 2017-10-27 DIAGNOSIS — R9402 Abnormal brain scan: Secondary | ICD-10-CM | POA: Diagnosis not present

## 2017-10-27 DIAGNOSIS — X58XXXS Exposure to other specified factors, sequela: Secondary | ICD-10-CM | POA: Diagnosis not present

## 2017-10-27 DIAGNOSIS — R05 Cough: Secondary | ICD-10-CM

## 2017-10-27 DIAGNOSIS — M545 Low back pain: Secondary | ICD-10-CM | POA: Insufficient documentation

## 2017-10-27 DIAGNOSIS — E860 Dehydration: Secondary | ICD-10-CM | POA: Diagnosis not present

## 2017-10-27 DIAGNOSIS — C7970 Secondary malignant neoplasm of unspecified adrenal gland: Secondary | ICD-10-CM | POA: Insufficient documentation

## 2017-10-27 DIAGNOSIS — C3490 Malignant neoplasm of unspecified part of unspecified bronchus or lung: Secondary | ICD-10-CM | POA: Insufficient documentation

## 2017-10-27 DIAGNOSIS — M8448XA Pathological fracture, other site, initial encounter for fracture: Secondary | ICD-10-CM

## 2017-10-27 DIAGNOSIS — Z681 Body mass index (BMI) 19 or less, adult: Secondary | ICD-10-CM

## 2017-10-27 DIAGNOSIS — C787 Secondary malignant neoplasm of liver and intrahepatic bile duct: Secondary | ICD-10-CM

## 2017-10-27 DIAGNOSIS — Z5111 Encounter for antineoplastic chemotherapy: Secondary | ICD-10-CM | POA: Diagnosis not present

## 2017-10-27 DIAGNOSIS — G893 Neoplasm related pain (acute) (chronic): Secondary | ICD-10-CM

## 2017-10-27 DIAGNOSIS — C7951 Secondary malignant neoplasm of bone: Secondary | ICD-10-CM | POA: Insufficient documentation

## 2017-10-27 DIAGNOSIS — Z79899 Other long term (current) drug therapy: Secondary | ICD-10-CM | POA: Insufficient documentation

## 2017-10-27 DIAGNOSIS — K5903 Drug induced constipation: Secondary | ICD-10-CM

## 2017-10-27 DIAGNOSIS — R945 Abnormal results of liver function studies: Secondary | ICD-10-CM | POA: Insufficient documentation

## 2017-10-27 DIAGNOSIS — K209 Esophagitis, unspecified: Secondary | ICD-10-CM | POA: Insufficient documentation

## 2017-10-27 DIAGNOSIS — E875 Hyperkalemia: Secondary | ICD-10-CM | POA: Diagnosis not present

## 2017-10-27 DIAGNOSIS — Z5112 Encounter for antineoplastic immunotherapy: Secondary | ICD-10-CM | POA: Insufficient documentation

## 2017-10-27 DIAGNOSIS — C801 Malignant (primary) neoplasm, unspecified: Secondary | ICD-10-CM

## 2017-10-27 DIAGNOSIS — C7931 Secondary malignant neoplasm of brain: Secondary | ICD-10-CM

## 2017-10-27 DIAGNOSIS — B370 Candidal stomatitis: Secondary | ICD-10-CM | POA: Insufficient documentation

## 2017-10-27 DIAGNOSIS — T451X5S Adverse effect of antineoplastic and immunosuppressive drugs, sequela: Secondary | ICD-10-CM | POA: Insufficient documentation

## 2017-10-27 LAB — CBC WITH DIFFERENTIAL/PLATELET
BASOS ABS: 0 10*3/uL (ref 0.0–0.1)
Basophils Relative: 0 %
Eosinophils Absolute: 0 10*3/uL (ref 0.0–0.5)
Eosinophils Relative: 0 %
HEMATOCRIT: 34.7 % — AB (ref 34.8–46.6)
Hemoglobin: 12.1 g/dL (ref 11.6–15.9)
LYMPHS PCT: 13 %
Lymphs Abs: 1 10*3/uL (ref 0.9–3.3)
MCH: 29.9 pg (ref 25.1–34.0)
MCHC: 34.9 g/dL (ref 31.5–36.0)
MCV: 85.7 fL (ref 79.5–101.0)
MONO ABS: 0.8 10*3/uL (ref 0.1–0.9)
Monocytes Relative: 10 %
NEUTROS ABS: 6.1 10*3/uL (ref 1.5–6.5)
Neutrophils Relative %: 77 %
Platelets: 181 10*3/uL (ref 145–400)
RBC: 4.05 MIL/uL (ref 3.70–5.45)
RDW: 13.6 % (ref 11.2–14.5)
WBC: 8 10*3/uL (ref 3.9–10.3)

## 2017-10-27 LAB — CMP (CANCER CENTER ONLY)
ALK PHOS: 319 U/L — AB (ref 40–150)
ALT: 119 U/L — ABNORMAL HIGH (ref 0–55)
AST: 108 U/L — AB (ref 5–34)
Albumin: 2.7 g/dL — ABNORMAL LOW (ref 3.5–5.0)
Anion gap: 12 — ABNORMAL HIGH (ref 3–11)
BILIRUBIN TOTAL: 3.7 mg/dL — AB (ref 0.2–1.2)
BUN: 9 mg/dL (ref 7–26)
CO2: 23 mmol/L (ref 22–29)
CREATININE: 0.63 mg/dL (ref 0.60–1.10)
Calcium: 9.6 mg/dL (ref 8.4–10.4)
Chloride: 91 mmol/L — ABNORMAL LOW (ref 98–109)
GFR, Est AFR Am: >60 mL/min (ref >60)
GLUCOSE: 124 mg/dL (ref 70–140)
Potassium: 3.6 mmol/L (ref 3.5–5.1)
Sodium: 126 mmol/L — ABNORMAL LOW (ref 136–145)
TOTAL PROTEIN: 6.7 g/dL (ref 6.4–8.3)

## 2017-10-27 LAB — RETICULOCYTES
RBC.: 4.05 MIL/uL (ref 3.70–5.45)
RETIC COUNT ABSOLUTE: 72.9 10*3/uL (ref 33.7–90.7)
Retic Ct Pct: 1.8 % (ref 0.7–2.1)

## 2017-10-27 MED ORDER — DEXAMETHASONE SODIUM PHOSPHATE 10 MG/ML IJ SOLN
10.0000 mg | Freq: Once | INTRAMUSCULAR | Status: AC
  Administered 2017-10-27: 10 mg via INTRAVENOUS

## 2017-10-27 MED ORDER — PALONOSETRON HCL INJECTION 0.25 MG/5ML
INTRAVENOUS | Status: AC
  Filled 2017-10-27: qty 5

## 2017-10-27 MED ORDER — DEXAMETHASONE SODIUM PHOSPHATE 100 MG/10ML IJ SOLN: 10.0000 mg | Freq: Once | INTRAMUSCULAR | Status: DC

## 2017-10-27 MED ORDER — SODIUM CHLORIDE 0.9 % IV SOLN
Freq: Once | INTRAVENOUS | Status: AC
  Administered 2017-10-27: 14:00:00 via INTRAVENOUS
  Filled 2017-10-27: qty 5

## 2017-10-27 MED ORDER — SODIUM CHLORIDE 0.9 % IV SOLN
Freq: Once | INTRAVENOUS | Status: AC
  Administered 2017-10-27: 13:00:00 via INTRAVENOUS

## 2017-10-27 MED ORDER — SODIUM CHLORIDE 0.9 % IV SOLN
100.0000 mg/m2 | Freq: Once | INTRAVENOUS | Status: AC
  Administered 2017-10-27: 150 mg via INTRAVENOUS
  Filled 2017-10-27: qty 7.5

## 2017-10-27 MED ORDER — SODIUM CHLORIDE 0.9 % IV SOLN
356.5000 mg | Freq: Once | INTRAVENOUS | Status: AC
  Administered 2017-10-27: 360 mg via INTRAVENOUS
  Filled 2017-10-27: qty 36

## 2017-10-27 MED ORDER — PALONOSETRON HCL INJECTION 0.25 MG/5ML
0.2500 mg | Freq: Once | INTRAVENOUS | Status: AC
  Administered 2017-10-27: 0.25 mg via INTRAVENOUS

## 2017-10-27 MED ORDER — SODIUM CHLORIDE 0.9 % IV SOLN
1200.0000 mg | Freq: Once | INTRAVENOUS | Status: AC
  Administered 2017-10-27: 1200 mg via INTRAVENOUS
  Filled 2017-10-27: qty 20

## 2017-10-27 MED ORDER — DEXAMETHASONE SODIUM PHOSPHATE 10 MG/ML IJ SOLN
INTRAMUSCULAR | Status: AC
  Filled 2017-10-27: qty 1

## 2017-10-27 NOTE — Patient Instructions (Signed)
Thank you for choosing [facility] to provide your oncology and hematology care.  To afford each patient quality time with our providers, please arrive 30 minutes before your scheduled appointment time.  If you arrive late for your appointment, you may be asked to reschedule.  We strive to give you quality time with our providers, and arriving late affects you and other patients whose appointments are after yours.   If you are a no show for multiple scheduled visits, you may be dismissed from the clinic at the providers discretion.    Again, thank you for choosing [facility], our hope is that these requests will decrease the amount of time that you wait before being seen by our physicians.  ______________________________________________________________________  Should you have questions after your visit to the  Cancer Center, please contact our office at [phone] between the hours of 8:30 and 4:30 p.m.    Voicemails left after 4:30p.m will not be returned until the following business day.    For prescription refill requests, please have your pharmacy contact us directly.  Please also try to allow 48 hours for prescription requests.    Please contact the scheduling department for questions regarding scheduling.  For scheduling of procedures such as PET scans, CT scans, MRI, Ultrasound, etc please contact central scheduling at [phone].    Resources For Cancer Patients and Caregivers:   Oncolink.org:  A wonderful resource for patients and healthcare providers for information regarding your disease, ways to tract your treatment, what to expect, etc.     American Cancer Society:  800-227-2345  Can help patients locate various types of support and financial assistance  Cancer Care: 1-800-813-HOPE (4673) Provides financial assistance, online support groups, medication/co-pay assistance.    [place] DSS:  336-641-3447 Where to apply for food  stamps, Medicaid, and utility assistance  Medicare Rights Center: 800-333-4114 Helps people with Medicare understand their rights and benefits, navigate the Medicare system, and secure the quality healthcare they deserve  SCAT: 336-333-6589 [place] [employer]'s shared-ride transportation service for eligible riders who have a disability that prevents them from riding the fixed route bus.    For additional information on assistance programs please contact our social worker:   Grier Hock/Abigail Elmore:  [phone]            

## 2017-10-27 NOTE — Patient Instructions (Signed)
[facility] Discharge Instructions for Patients Receiving Chemotherapy  Today you received the following chemotherapy agents: Carbo, Etoposide, Tecentriq  To help prevent nausea and vomiting after your treatment, we encourage you to take your nausea medication as prescribed.   If you develop nausea and vomiting that is not controlled by your nausea medication, call the clinic.   BELOW ARE SYMPTOMS THAT SHOULD BE REPORTED IMMEDIATELY:  *FEVER GREATER THAN 100.5 F  *CHILLS WITH OR WITHOUT FEVER  NAUSEA AND VOMITING THAT IS NOT CONTROLLED WITH YOUR NAUSEA MEDICATION  *UNUSUAL SHORTNESS OF BREATH  *UNUSUAL BRUISING OR BLEEDING  TENDERNESS IN MOUTH AND THROAT WITH OR WITHOUT PRESENCE OF ULCERS  *URINARY PROBLEMS  *BOWEL PROBLEMS  UNUSUAL RASH Items with * indicate a potential emergency and should be followed up as soon as possible.  Feel free to call the clinic should you have any questions or concerns. The clinic phone number is [phone].  Please show the [facility] at check-in to the Emergency Department and triage nurse.  Carboplatin injection What is this medicine? CARBOPLATIN (KAR boe pla tin) is a chemotherapy drug. It targets fast dividing cells, like cancer cells, and causes these cells to die. This medicine is used to treat ovarian cancer and many other cancers. This medicine may be used for other purposes; ask your health care provider or pharmacist if you have questions. COMMON BRAND NAME(S): Paraplatin What should I tell my health care provider before I take this medicine? They need to know if you have any of these conditions: -blood disorders -hearing problems -kidney disease -recent or ongoing radiation therapy -an unusual or allergic reaction to carboplatin, cisplatin, other chemotherapy, other medicines, foods, dyes, or preservatives -pregnant or trying to get pregnant -breast-feeding How should I use this medicine? This drug is  usually given as an infusion into a vein. It is administered in a hospital or clinic by a specially trained health care professional. Talk to your pediatrician regarding the use of this medicine in children. Special care may be needed. Overdosage: If you think you have taken too much of this medicine contact a poison control center or emergency room at once. NOTE: This medicine is only for you. Do not share this medicine with others. What if I miss a dose? It is important not to miss a dose. Call your doctor or health care professional if you are unable to keep an appointment. What may interact with this medicine? -medicines for seizures -medicines to increase blood counts like filgrastim, pegfilgrastim, sargramostim -some antibiotics like amikacin, gentamicin, neomycin, streptomycin, tobramycin -vaccines Talk to your doctor or health care professional before taking any of these medicines: -acetaminophen -aspirin -ibuprofen -ketoprofen -naproxen This list may not describe all possible interactions. Give your health care provider a list of all the medicines, herbs, non-prescription drugs, or dietary supplements you use. Also tell them if you smoke, drink alcohol, or use illegal drugs. Some items may interact with your medicine. What should I watch for while using this medicine? Your condition will be monitored carefully while you are receiving this medicine. You will need important blood work done while you are taking this medicine. This drug may make you feel generally unwell. This is not uncommon, as chemotherapy can affect healthy cells as well as cancer cells. Report any side effects. Continue your course of treatment even though you feel ill unless your doctor tells you to stop. In some cases, you may be given additional medicines to help with side effects.  Follow all directions for their use. Call your doctor or health care professional for advice if you get a fever, chills or sore throat,  or other symptoms of a cold or flu. Do not treat yourself. This drug decreases your body's ability to fight infections. Try to avoid being around people who are sick. This medicine may increase your risk to bruise or bleed. Call your doctor or health care professional if you notice any unusual bleeding. Be careful brushing and flossing your teeth or using a toothpick because you may get an infection or bleed more easily. If you have any dental work done, tell your dentist you are receiving this medicine. Avoid taking products that contain aspirin, acetaminophen, ibuprofen, naproxen, or ketoprofen unless instructed by your doctor. These medicines may hide a fever. Do not become pregnant while taking this medicine. Women should inform their doctor if they wish to become pregnant or think they might be pregnant. There is a potential for serious side effects to an unborn child. Talk to your health care professional or pharmacist for more information. Do not breast-feed an infant while taking this medicine. What side effects may I notice from receiving this medicine? Side effects that you should report to your doctor or health care professional as soon as possible: -allergic reactions like skin rash, itching or hives, swelling of the face, lips, or tongue -signs of infection - fever or chills, cough, sore throat, pain or difficulty passing urine -signs of decreased platelets or bleeding - bruising, pinpoint red spots on the skin, black, tarry stools, nosebleeds -signs of decreased red blood cells - unusually weak or tired, fainting spells, lightheadedness -breathing problems -changes in hearing -changes in vision -chest pain -high blood pressure -low blood counts - This drug may decrease the number of white blood cells, red blood cells and platelets. You may be at increased risk for infections and bleeding. -nausea and vomiting -pain, swelling, redness or irritation at the injection site -pain,  tingling, numbness in the hands or feet -problems with balance, talking, walking -trouble passing urine or change in the amount of urine Side effects that usually do not require medical attention (report to your doctor or health care professional if they continue or are bothersome): -hair loss -loss of appetite -metallic taste in the mouth or changes in taste This list may not describe all possible side effects. Call your doctor for medical advice about side effects. You may report side effects to FDA at 1-800-FDA-1088. Where should I keep my medicine? This drug is given in a hospital or clinic and will not be stored at home. NOTE: This sheet is a summary. It may not cover all possible information. If you have questions about this medicine, talk to your doctor, pharmacist, or health care provider.  2018 [employer]/Gold Standard (2007-11-17 14:38:05) Etoposide, VP-16 capsules What is this medicine? ETOPOSIDE, VP-16 (e toe POE side) is a chemotherapy drug. It is used to treat small cell lung cancer and other cancers. This medicine may be used for other purposes; ask your health care provider or pharmacist if you have questions. COMMON BRAND NAME(S): VePesid What should I tell my health care provider before I take this medicine? They need to know if you have any of these conditions: -infection -kidney disease -liver disease -low blood counts, like low white cell, platelet, or red cell counts -an unusual or allergic reaction to etoposide, other medicines, foods, dyes, or preservatives -pregnant or trying to get pregnant -breast-feeding How should I use this medicine?  Take this medicine by mouth with a glass of water. Follow the directions on the prescription label. Do not open, crush, or chew the capsules. It is advisable to wear gloves when handling this medicine. Take your medicine at regular intervals. Do not take it more often than directed. Do not stop taking except on your doctor's  advice. Talk to your pediatrician regarding the use of this medicine in children. Special care may be needed. Overdosage: If you think you have taken too much of this medicine contact a poison control center or emergency room at once. NOTE: This medicine is only for you. Do not share this medicine with others. What if I miss a dose? If you miss a dose, take it as soon as you can. If it is almost time for your next dose, take only that dose. Do not take double or extra doses. What may interact with this medicine? -aspirin -certain medications for seizures like carbamazepine, phenobarbital, phenytoin, valproic acid -cyclosporine -levamisole -valproic acid -warfarin This list may not describe all possible interactions. Give your health care provider a list of all the medicines, herbs, non-prescription drugs, or dietary supplements you use. Also tell them if you smoke, drink alcohol, or use illegal drugs. Some items may interact with your medicine. What should I watch for while using this medicine? Visit your doctor for checks on your progress. This drug may make you feel generally unwell. This is not uncommon, as chemotherapy can affect healthy cells as well as cancer cells. Report any side effects. Continue your course of treatment even though you feel ill unless your doctor tells you to stop. In some cases, you may be given additional medicines to help with side effects. Follow all directions for their use. Call your doctor or health care professional for advice if you get a fever, chills or sore throat, or other symptoms of a cold or flu. Do not treat yourself. This drug decreases your body's ability to fight infections. Try to avoid being around people who are sick. This medicine may increase your risk to bruise or bleed. Call your doctor or health care professional if you notice any unusual bleeding. Talk to your doctor about your risk of cancer. You may be more at risk for certain types of  cancers if you take this medicine. Do not become pregnant while taking this medicine or for at least 6 months after stopping it. Women should inform their doctor if they wish to become pregnant or think they might be pregnant. Women of child-bearing potential will need to have a negative pregnancy test before starting this medicine. There is a potential for serious side effects to an unborn child. Talk to your health care professional or pharmacist for more information. Do not breast-feed an infant while taking this medicine. Men must use a latex condom during sexual contact with a woman while taking this medicine and for at least 4 months after stopping it. A latex condom is needed even if you have had a vasectomy. Contact your doctor right away if your partner becomes pregnant. Do not donate sperm while taking this medicine and for 4 months after you stop taking this medicine. Men should inform their doctors if they wish to father a child. This medicine may lower sperm counts. What side effects may I notice from receiving this medicine? Side effects that you should report to your doctor or health care professional as soon as possible: -allergic reactions like skin rash, itching or hives, swelling of the face,  lips, or tongue -low blood counts - this medicine may decrease the number of white blood cells, red blood cells and platelets. You may be at increased risk for infections and bleeding. -signs of infection - fever or chills, cough, sore throat, pain or difficulty passing urine -signs of decreased platelets or bleeding - bruising, pinpoint red spots on the skin, black, tarry stools, blood in the urine -signs of decreased red blood cells - unusually weak or tired, fainting spells, lightheadedness -breathing problems -changes in vision -mouth or throat sores or ulcers -pain, tingling, numbness in the hands or feet -redness, blistering, peeling or loosening of the skin, including inside the  mouth -seizures -vomiting Side effects that usually do not require medical attention (report to your doctor or health care professional if they continue or are bothersome): -change in taste -diarrhea -hair loss -nausea -stomach pain This list may not describe all possible side effects. Call your doctor for medical advice about side effects. You may report side effects to FDA at 1-800-FDA-1088. Where should I keep my medicine? Keep out of the reach of children. Store in a refrigerator between 2 and 8 degrees C (36 and 46 degrees F). Do not freeze. Throw away any unused medicine after the expiration date. NOTE: This sheet is a summary. It may not cover all possible information. If you have questions about this medicine, talk to your doctor, pharmacist, or health care provider.  2018 [employer]/Gold Standard (2015-08-04 11:49:52) Atezolizumab injection What is this medicine? ATEZOLIZUMAB (a te zoe LIZ ue mab) is a monoclonal antibody. It is used to treat bladder cancer (urothelial cancer) and non-small cell lung cancer. This medicine may be used for other purposes; ask your health care provider or pharmacist if you have questions. COMMON BRAND NAME(S): Tecentriq What should I tell my health care provider before I take this medicine? They need to know if you have any of these conditions: -diabetes -immune system problems -infection -inflammatory bowel disease -liver disease -lung or breathing disease -lupus -nervous system problems like myasthenia gravis or Guillain-Barre syndrome -organ transplant -an unusual or allergic reaction to atezolizumab, other medicines, foods, dyes, or preservatives -pregnant or trying to get pregnant -breast-feeding How should I use this medicine? This medicine is for infusion into a vein. It is given by a health care professional in a hospital or clinic setting. A special MedGuide will be given to you before each treatment. Be sure to read this information  carefully each time. Talk to your pediatrician regarding the use of this medicine in children. Special care may be needed. Overdosage: If you think you have taken too much of this medicine contact a poison control center or emergency room at once. NOTE: This medicine is only for you. Do not share this medicine with others. What if I miss a dose? It is important not to miss your dose. Call your doctor or health care professional if you are unable to keep an appointment. What may interact with this medicine? Interactions have not been studied. This list may not describe all possible interactions. Give your health care provider a list of all the medicines, herbs, non-prescription drugs, or dietary supplements you use. Also tell them if you smoke, drink alcohol, or use illegal drugs. Some items may interact with your medicine. What should I watch for while using this medicine? Your condition will be monitored carefully while you are receiving this medicine. You may need blood work done while you are taking this medicine. Do not become pregnant while  taking this medicine or for at least 5 months after stopping it. Women should inform their doctor if they wish to become pregnant or think they might be pregnant. There is a potential for serious side effects to an unborn child. Talk to your health care professional or pharmacist for more information. Do not breast-feed an infant while taking this medicine or for at least 5 months after the last dose. What side effects may I notice from receiving this medicine? Side effects that you should report to your doctor or health care professional as soon as possible: -allergic reactions like skin rash, itching or hives, swelling of the face, lips, or tongue -black, tarry stools -bloody or watery diarrhea -breathing problems -changes in vision -chest pain or chest tightness -chills -facial flushing -fever -headache -signs and symptoms of high blood sugar such  as dizziness; dry mouth; dry skin; fruity breath; nausea; stomach pain; increased hunger or thirst; increased urination -signs and symptoms of liver injury like dark yellow or brown urine; general ill feeling or flu-like symptoms; light-colored stools; loss of appetite; nausea; right upper belly pain; unusually weak or tired; yellowing of the eyes or skin -stomach pain -trouble passing urine or change in the amount of urine Side effects that usually do not require medical attention (report to your doctor or health care professional if they continue or are bothersome): -cough -diarrhea -joint pain -muscle pain -muscle weakness -tiredness -weight loss This list may not describe all possible side effects. Call your doctor for medical advice about side effects. You may report side effects to FDA at 1-800-FDA-1088. Where should I keep my medicine? This drug is given in a hospital or clinic and will not be stored at home. NOTE: This sheet is a summary. It may not cover all possible information. If you have questions about this medicine, talk to your doctor, pharmacist, or health care provider.  2018 [employer]/Gold Standard (2015-09-13 17:54:14)

## 2017-10-27 NOTE — Progress Notes (Signed)
Ok to treat with elevated liver enzymes and bilirubin per Dr. Irene Limbo.

## 2017-10-27 NOTE — Progress Notes (Signed)
HEMATOLOGY/ONCOLOGY CONSULTATION NOTE  Date of Service: 10/27/2017  Patient Care Team: [facility], Modena Nunnery, MD as PCP - General (Family Medicine)  CHIEF COMPLAINTS/PURPOSE OF CONSULTATION:   F/u for continued management of extensive stage small cell lung cancer.  HISTORY OF PRESENTING ILLNESS:   Haley [name] is a wonderful 67 y.o. female who has been referred to [place] by Orthopedists Dr. Vickki Hearing for evaluation and management of Bone Marrow Abnormality. Her PCP is Dr. Buelah Manis.   She presents to the clinic today accompanied by her sons and her daughter-in-law. She notes she has several issues since October 2018.  She had colitis in 05/2017 with significant diarrhea and abdominal cramping --now mostly resolved.  She was treated with antibiotics. Afterward she had bronchitis. She was given albuterol and continues to take it as she has remaining cough (smoking contributed to this).   On 08/07/17 she notes she has back pain in her lower back the past couple of months. 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 Next day she saw her physician, who had done a xray and told her she had arthritis. She was treated with prednisone and gabapentin. After no back pain improvements on treatment, she has yet to return to work. She went to orthopedist and had a Lumbar MRI on 09/25/17. This shows a compression fracture of the L2 with abnormalities in her bone marrow.   She quit smoking 09/24/17. She had been smoking 1 pack a day for 50 years, since she was 67 yo. She still has cough from smoking but not as significant.   She denies heart conditions or disease, GI issues or diseases or other significant medical issues. DM runs in her family but no known cancers, blood disorders, or gene mutations. Prior to her health in the past 6 months she has lead a independent overall healthy life. Her last mammogram in 04/2017 was negative. Last colonoscopy in 2015 and last P[place] in 2017, were  negative. She had a bone density scan in 2009. She has never been on HR therapy.   Given her significant pain (up to 10/10). She currently takes hydrocodone 1 tablet q4-5hours. This only reduced her pain to 7-8/10. She takes Tizanidine 88m q5-6hours. She has a back brace that she does not use.   On review of symptoms, pt notes ongoing b/l hip pain, lower back pain which can radiate up the side and down her legs and buttocks. The pain can keep her up at night. She notes when she lays on her right side, her throat pain which causes her to cough. This started around time of bronchitis and has not improved. When she does cough she produces clear phlegm.    Interval History:   Haley SNEEDreturns today regarding her newly diagnosed extensive stage metastatic small cell lung cancer. The patient's last visit with [place] on 10/[place]/17. She is accompanied today by her sister and notes that she has continued to surround herself with family.  The pt reports that she is doing well overall and is very pleased about her successful ER[facility] 10[facility]24[facility]17 [facility]   Lab results today (10/27/17) of CBC, CMP, and Reticulocytes is as follows: all values are WNL except for HCT at 34.7, Sodium at 126, Chloride at 91, Albumin at 2.7, AST at 108, ALT at 119, Alkaline Phophatase at 319, and Total Bilirubin at 3.7.   LFT's improving. FNAC of pancreatic mass with ERCP --  also showed small cell carcinoma.  She notes that her back pain is more under control with the Oxycontin. She reports that her urine is no longer brown. She reports that her constipation is resolved. She has been taking Dexamethasone for her back pain as well. She presents to the clinic today with noticeably reduced, but mildly persistent, jaundice.   She is keen to get started with chemotherapy/Immunotherapy and has completed chemotherapy counseling.  On review of systems, pt reports controlled back pain, eating better, lighter  colored urine, resolved constipation, mild flank pain, and denies muscle pain, abdominal pain, and any other symptoms.   MEDICAL HISTORY:  Past Medical History:  Diagnosis Date  . Allergy   . Osteopenia     SURGICAL HISTORY: Past Surgical History:  Procedure Laterality Date  . COLONOSCOPY    . COLONOSCOPY WITH PROPOFOL N/A 10/25/2014   Procedure: COLONOSCOPY WITH PROPOFOL;  Surgeon: Garlan Fair, MD;  Location: [facility] ENDOSCOPY;  Service: Endoscopy;  Laterality: N/A;  . ERCP N/A 10/15/2017   Procedure: ENDOSCOPIC RETROGRADE CHOLANGIOPANCREATOGRAPHY (ERCP);  Surgeon: Clarene Essex, MD;  Location: [facility];  Service: Endoscopy;  Laterality: N/A;  . IR RADIOLOGIST EVAL & MGMT  10/02/2017    SOCIAL HISTORY: Social History   Socioeconomic History  . Marital status: Single    Spouse name: Not on file  . Number of children: Not on file  . Years of education: Not on file  . Highest education level: Not on file  Social Needs  . Financial resource strain: Not on file  . Food insecurity - worry: Not on file  . Food insecurity - inability: Not on file  . Transportation needs - medical: Not on file  . Transportation needs - non-medical: Not on file  Occupational History  . Not on file  Tobacco Use  . Smoking status: Former Smoker    Packs/day: 1.00    Years: 40.00    Pack years: 40.00    Types: Cigarettes    Last attempt to quit: 09/24/2017    Years since quitting: 0.0  . Smokeless tobacco: Never Used  Substance and Sexual Activity  . Alcohol use: Yes    Comment: rare social  . Drug use: No  . Sexual activity: Not Currently  Other Topics Concern  . Not on file  Social History Narrative  . Not on file    FAMILY HISTORY: Family History  Problem Relation Age of Onset  . Diabetes Mother   . Hypertension Mother   . Diabetes Sister   . Hypertension Sister   . Hyperlipidemia Sister     ALLERGIES:  has No Known Allergies.  MEDICATIONS:  Current Outpatient Medications    Medication Sig Dispense Refill  . albuterol (PROVENTIL HFA;VENTOLIN HFA) 108 (90 Base) MCG/ACT inhaler Inhale 2 puffs into the lungs every 6 (six) hours as needed for wheezing or shortness of breath. 1 Inhaler 2  . dexamethasone (DECADRON) 4 MG tablet Take 1 tablet (4 mg total) by mouth daily with breakfast. 20 tablet 0  . lidocaine-prilocaine (EMLA) cream Apply to affected area once 30 g 3  . LORazepam (ATIVAN) 0.5 MG tablet Take 1 tablet (0.5 mg total) by mouth every 6 (six) hours as needed (Nausea or vomiting). 30 tablet 0  . ondansetron (ZOFRAN) 4 MG tablet Take 1 tablet (4 mg total) by mouth every 8 (eight) hours as needed for nausea or vomiting. 20 tablet 0  . oxyCODONE (OXY IR/ROXICODONE) 5 MG immediate release tablet Take 1-2 tablets (5-10  mg total) by mouth every 4 (four) hours as needed for severe pain. 60 tablet 0  . oxyCODONE (OXYCONTIN) 15 mg 12 hr tablet Take 1 tablet (15 mg total) by mouth every 12 (twelve) hours. 60 tablet 0  . polyethylene glycol (MIRALAX) packet Take 17 g by mouth daily as needed for moderate constipation. 30 each 0  . prochlorperazine (COMPAZINE) 10 MG tablet Take 1 tablet (10 mg total) by mouth every 6 (six) hours as needed (Nausea or vomiting). 30 tablet 1  . senna-docusate (SENNA S) 8.6-50 MG tablet Take 2 tablets by mouth at bedtime. 60 tablet 1  . tiZANidine (ZANAFLEX) 4 MG tablet Take 1 tablet (4 mg total) by mouth every 8 (eight) hours as needed for muscle spasms. 30 tablet 0   No current facility-administered medications for this visit.     REVIEW OF SYSTEMS:    .10 Point review of Systems was done is negative except as noted above.  PHYSICAL EXAMINATION: ECOG PERFORMANCE STATUS: 2 - Symptomatic, <50% confined to bed  . Vitals:   10/27/17 1037  BP: 110/66  Pulse: 68  Resp: 20  Temp: 98.3 F (36.8 C)  SpO2: 94%   Filed Weights   10/27/17 1037  Weight: 112 lb 4.8 oz (50.9 kg)   .Body mass index is 19.89 kg/m.  Marland Kitchen GENERAL:alert, in no  acute distress and comfortable SKIN: no acute rashes, no significant lesions EYES: conjunctiva are pink and non-injected, sclera icteric (improved) OROPHARYNX: MMM, no exudates, no oropharyngeal erythema or ulceration NECK: supple, no JVD LYMPH:  no palpable lymphadenopathy in the cervical, axillary or inguinal regions LUNGS: clear to auscultation b/l with normal respiratory effort HEART: regular rate & rhythm ABDOMEN:  normoactive bowel sounds , non tender, not distended. 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  Specimen Adequacy:Satisfactory for evaluation.    COMMENT:The smears show clusters of neoplastic cells with neuroendocrine nuclear features and scant cytoplasm.  Immunoperoxidase studies on limited cell block material show the neoplastic cells stain positive for synaptophysin, CD56, cytokeratin AE1/AE3 (Golgi-dot like staining) and TTF-1. The MIB-1 shows a high proliferation index with positive staining in approximately 80% of the cells. Together with the cytomorphology this supports the diagnosis of small cell carcinoma. Given the presence of a suspected right infrahilar lung mass on MRI per [facility] and a pancreatic mass, I cannot be certain of the site of origin and whether this represents a primary pancreatic small cell poorly differentiated endocrine carcinoma versus a metastatic small cell carcinoma  of lung origin (both can express TTF-1). Clinical correlation advised.  Dr. Romilda Garret has reviewed the case in consultation and agrees with the diagnosis of small cell carcinoma.   The positive controls worked appropriately   "These tests were developed and their performance characteristics determined by [facility].They have not been cleared or approved by the U.S. Food and Drug Administration.The FDA has determined that such clearance or approval is not necessary.These tests are used for clinical purposes.They should not be regarded as investigational or for research.This  laboratory is certified under the Clinical Laboratory Improvement Amendments of 1988 (CLIA) as qualified to perform high complexity clinical laboratory testing."   [name] Prepared By:O.A. Sheryle Hail, M.D.  I have personally reviewed the slides and/or other related materials referenced, and have edited the [name] as part of my pathologic assessment and final interpretation.  Electronically Signed Out By: Blythe Stanford , MD 10/28/2017 20:55:03  sb/oah  Specimen(s) Received:  Pancreas, endoscopic ultrasound-guided, fine needle aspiration II (smears and cell block)  Clinical History obstructive jaundice  Preliminary (on-site) Interpretation  Adequate. L.Cox CT(ASCP) 10/24/17     RADIOGRAPHIC STUDIES: I have personally reviewed the radiological images as listed and agreed with the findings in the [name]. Dg Chest 2 View  Result Date: 10/05/2017 CLINICAL DATA:  Chest pain and shortness of breath EXAM: CHEST  2 VIEW COMPARISON:  Lumbar MRI September 24, 2017 FINDINGS: There is consolidation in the medial right base. Lungs elsewhere are clear. Heart size and pulmonary vascularity are normal. No adenopathy. No pneumothorax. There is a burst type fracture of the L2 vertebral body, present on recent MR. IMPRESSION: Consolidation medial right base. Lungs  elsewhere clear. No adenopathy. Burst type fracture L2 vertebral body, present on prior MR. Electronically Signed   By: Lowella Grip III M.D.   On: 10/05/2017 21:56   Ct Angio Chest Pe W And/or Wo Contrast  Result Date: 10/05/2017 CLINICAL DATA:  Pt c/o upper R chest pain, difficulty breathing, and cough. She has been experiencing lower back pain over the last few weeks and was dx'd with a crushed vertebrae and possibly lung cancer. EXAM: CT ANGIOGRAPHY CHEST WITH CONTRAST TECHNIQUE: Multidetector CT imaging of the chest was performed using the standard protocol during bolus administration of intravenous contrast. Multiplanar CT image reconstructions and MIPs were obtained to evaluate the vascular anatomy. CONTRAST:  <See Chart> ISOVUE-370 IOPAMIDOL (ISOVUE-370) INJECTION 76% COMPARISON:  Current chest radiographs FINDINGS: Cardiovascular: Satisfactory opacification of the pulmonary arteries to the segmental level. No evidence of pulmonary embolism. Normal heart size. No pericardial effusion. There are mild, 3 vessel coronary artery calcifications. Aorta is normal in caliber. No dissection. Mild atherosclerotic disease noted along the arch and descending thoracic aorta. Mediastinum/Nodes: No neck base or axillary masses or adenopathy. Visualized thyroid is in appearance. Borderline enlarged right paratracheal, azygos level lymph node measuring 11 mm short axis. There is abnormal soft tissue that extends along the inferior right hilum and subcarinal mediastinum to surround the bronchus intermedius in occludes the more distal bronchus intermedius and right middle lobe bronchus. Lungs/Pleura: Complete atelectasis of the right lower lobe. With near complete atelectasis of the right middle lobe. Moderate to advanced centrilobular emphysema. No discrete lung masses or nodules. No evidence of pneumonia or pulmonary edema. Small right pleural effusion. No pneumothorax. Upper Abdomen: Low-density liver lesions, 3  total seen, along the right lobe, measuring from 16 mm to 20 mm. Left adrenal mass measuring 16 mm. No acute findings in the upper abdomen. Musculoskeletal: No fracture or acute finding. No osteoblastic or osteolytic lesions. Review of the MIP images confirms the above findings. IMPRESSION: 1. No evidence of a pulmonary embolism. 2. Abnormal soft tissue, consistent with malignancy, surrounds the inferior right hilar structures and occludes the bronchus intermedius leading to complete atelectasis of the right lower lobe and significant atelectasis of the right middle lobe. There is a single borderline enlarged right paratracheal lymph node measuring 11 mm in short axis. 3. Three liver lesions are seen suspicious for metastatic disease. Enlarged left adrenal gland  is also suspicious for metastatic disease. 4. No convincing pneumonia or evidence of pulmonary edema. 5. Moderate to advanced emphysema. Aortic Atherosclerosis (ICD10-I70.0) and Emphysema (ICD10-J43.9). Electronically Signed   By: Lajean Manes M.D.   On: 10/05/2017 23:01   Mr Jeri Cos BO Contrast  Result Date: 10/29/2017 CLINICAL DATA:  Staging of small cell lung cancer. EXAM: MRI HEAD WITHOUT AND WITH CONTRAST TECHNIQUE: Multiplanar, multiecho pulse sequences of the brain and surrounding structures were obtained without and with intravenous contrast. CONTRAST:  35m MULTIHANCE GADOBENATE DIMEGLUMINE 529 MG/ML IV SOLN COMPARISON:  None. FINDINGS: Brain: Positive for metastatic disease. The patient's brain metastases are all subcentimeter and show no significant vasogenic edema. The metastases are best seen on diffusion imaging as they are subtly enhancing. Post-contrast visualization is best on the sagittal acquisition, which is also the latest. At least 12 brain metastases are seen in labeled on series 77. Many these are visible on series 15. Two punctate enhancing lesions seen on series 15 are not clearly seen on diffusion imaging, located right frontal  on 6:15 and left periventricular on 14:15. This gives a total of 14 visualized brain metastases. No evidence of acute infarct, hemorrhage, hydrocephalus, or collection. Vascular: Major flow voids are preserved. Skull and upper cervical spine: Infiltrative marrow lesions seen in the clivus, C2 body, C2 left lateral mass, C3 left articular process, and C4 body and left articular process. Calvarial metastases are also noted. Sinuses/Orbits: Remote blowout fracture of the medial wall left orbit. No evidence of orbital metastatic disease. Other: These results will be called to the ordering clinician or representative by the Radiologist Assistant, and communication documented in the PACS or zVision Dashboard. IMPRESSION: 1. Positive for multiple subcentimeter brain metastases as recorded above. No associated mass effect or edema. 2. Multifocal osseous metastatic disease. Electronically Signed   By: JMonte FantasiaM.D.   On: 10/29/2017 11:26   Mr Lumbar Spine W Wo Contrast  Result Date: 10/06/2017 CLINICAL DATA:  67y/o F; bone metastasis with pathologic fracture of L2 vertebral body. Numbness and tingling beginning to radiate down her legs. EXAM: MRI LUMBAR SPINE WITHOUT AND WITH CONTRAST TECHNIQUE: Multiplanar and multiecho pulse sequences of the lumbar spine were obtained without and with intravenous contrast. CONTRAST:  173mMULTIHANCE GADOBENATE DIMEGLUMINE 529 MG/ML IV SOLN COMPARISON:  09/24/2017 MRI of the lumbar spine. 10/05/2017 CT of the chest. FINDINGS: Segmentation:  Standard. Alignment:  Physiologic. Vertebrae: Diffuse low T1 signal with mild enhancement throughout the visible lumbar spine and sacrum patchy areas of fatty marrow sparing. Stable moderate compression deformity of L2 vertebral body with 7 mm retropulsion of superior endplate. Stable mild irregular enhancement of the L2 vertebral body. No new compression deformity. No epidural disease. Conus medullaris and cauda equina: Conus extends to the  L1 level. Conus and cauda equina appear normal. Paraspinal and other soft tissues: Numerous masses throughout the liver measuring up to 23 mm in segment 2 and ill-defined nodule in left adrenal gland, likely metastatic disease. Disc levels: L1-2: Retropulsion of L2 superior endplate results in stable moderate canal stenosis. No foraminal stenosis. L2-3: No significant disc displacement, foraminal stenosis, or canal stenosis. L3-4: Small disc bulge with mild facet and ligamentum flavum hypertrophy. No significant foraminal or canal stenosis. L4-5: Small disc bulge with moderate facet hypertrophy. No significant foraminal or canal stenosis. L5-S1: Disc bulge with endplate marginal osteophytes eccentric to the left and mild bilateral facet hypertrophy. Moderate left foraminal stenosis. No canal stenosis. IMPRESSION: 1. Stable L2 compression deformity with  7 mm retropulsion superior endplate resulting in moderate canal stenosis. 2. No abnormal enhancement of the visible cord or cauda equina. No new fracture. No epidural metastatic disease. 3. Diffuse decreased T1 signal throughout the visible spine and sacrum compatible with a fatty marrow replacement process which may be red marrow, neoplasm, or chemotherapy related response. 4. Stable lumbar spondylosis greatest at L5-S1 level where there is moderate left foraminal stenosis. 5. Multiple hepatic masses and left adrenal mass compatible with metastatic disease. Electronically Signed   By: Kristine Garbe M.D.   On: 10/06/2017 23:47   Mr 3d Recon At Scanner  Result Date: 10/15/2017 CLINICAL DATA:  Metastatic carcinoma of uncertain primary. Abnormal liver function tests and jaundice. EXAM: MRI ABDOMEN WITHOUT AND WITH CONTRAST (INCLUDING MRCP) TECHNIQUE: Multiplanar multisequence MR imaging of the abdomen was performed both before and after the administration of intravenous contrast. Heavily T2-weighted images of the biliary and pancreatic ducts were obtained,  and three-dimensional MRCP images were rendered by post processing. CONTRAST:  33m MULTIHANCE GADOBENATE DIMEGLUMINE 529 MG/ML IV SOLN COMPARISON:  PET-CT dated 10/09/2017 and CT abdomen from 05/24/2017 FINDINGS: Lower chest: Airspace opacity and some volume loss in the right lower lobe is again observed with a suspected right infrahilar mass only partially seen today. Borderline cardiomegaly. Small right and trace left pleural effusions. Hepatobiliary: Moderate intrahepatic biliary dilatation with CBD dilatation up to 1.3 cm in diameter, and abrupt truncation of the common bile duct in the vicinity of the pancreatic head mass. There are approximately 20 hypoenhancing masses scattered in the liver. One lesion in segment 3 of the liver measures 2.7 by 2.0 cm, previously 2.6 by 2.0 cm on 10/09/2017 by my measurement. There is mild gallbladder wall thickening with accentuated precontrast T1 signal in the gallbladder favoring sludge. Pancreas: Hypoenhancing mass in the head of the pancreas measures proximally 3.4 by 3.1 cm on image [id number]. Mildly dilated dorsal pancreatic duct extending to the margin of this mass as shown on image 25/401. Trace peripancreatic edema. Spleen:  Unremarkable.  Trace perisplenic ascites. Adrenals/Urinary Tract: 1.1 by 1.8 cm left adrenal mass was previously hypermetabolic, and probably represents a metastatic lesion. The kidneys appear unremarkable. Trace right greater than left perirenal stranding. Stomach/Bowel: No dilated bowel identified. Vascular/Lymphatic:  Aortoiliac atherosclerotic vascular disease. Other:  No supplemental non-categorized findings. Musculoskeletal: Scattered widespread osseous metastatic lesions including the sternum, ribs, spine, and bony pelvis. L2 compression fracture with associated enhancement, and possibly acute component of fracture. IMPRESSION: 1. 3.4 cm pancreatic head mass is obstructing the common bile duct, which measures up to 1.3 cm in diameter. There  is also moderate intrahepatic biliary dilatation. The mass also causes truncation of the dorsal pancreatic duct which is mildly dilated. 2. Approximately 20 metastatic lesions are identified in the liver. 3. Right lower lobe airspace opacity and atelectasis. Suspected right infrahilar mass. 4. Small right and trace left pleural effusions. 5. Small left adrenal mass is suspicious for a metastatic lesion. 6. Scattered widespread osseous metastatic disease. 7. Other imaging findings of potential clinical significance: Aortic Atherosclerosis (ICD10-I70.0). L2 compression fracture as shown on prior MRI. Mild upper abdominal ascites with some trace edema along the mesentery and along several organs. Suspected gallbladder sludge with borderline distended gallbladder. Electronically Signed   By: WVan ClinesM.D.   On: 10/15/2017 09:55   Nm Pet Image Initial (pi) Skull Base To Thigh  Result Date: 10/09/2017 CLINICAL DATA:  Initial treatment strategy for metastatic disease of unknown primary. EXAM: NUCLEAR MEDICINE PET SKULL  BASE TO THIGH TECHNIQUE: 6.3 mCi F-18 FDG was injected intravenously. Full-ring PET imaging was performed from the skull base to thigh after the radiotracer. CT data was obtained and used for attenuation correction and anatomic localization. FASTING BLOOD GLUCOSE:  Value: 95 mg/dl COMPARISON:  CT chest 10/05/2017.  Abdomen and pelvis CT 04/23/2017. FINDINGS: NECK: No hypermetabolic lymph nodes in the neck. CHEST: Mediastinal lymphadenopathy seen on recent CT chest is hypermetabolic. The subcarinal lymph node is measures an index with SUV max = 9.1. Mass lesion in the inferior right hilum is hypermetabolic with SUV max = 9.9. Hypermetabolic lymph nodes are seen in the thoracic inlet bilaterally, in the paratracheal space, and in the right hilum. Tiny right pleural effusion noted. Emphysema noted in the lungs bilaterally. ABDOMEN/PELVIS: Multiple hypermetabolic liver metastases are evident. 3.0  cm lesion identified in the subcapsular liver on image 101 demonstrates SUV max = 8.3. Other scattered hypermetabolic liver metastases are evident. 2.4 cm lesion lateral segment left liver on image 118 demonstrates SUV max = 8.8. 3.6 x 3.9 cm soft tissue mass likely in the anterior head of pancreas is hypermetabolic with SUV max = 8.1. Layering high attenuation in the gallbladder lumen likely vicarious excretion of contrast from recent abdomen and pelvis CT as no gallstones were evident on 05/24/2017. Also possible that this represents sludge or interval development of innumerable tiny mineralized gallstones. Mild intra and extrahepatic biliary duct dilatation is evident. Extrahepatic common duct measures 9 mm diameter and the common bile duct appears obliterated at the level of the above-described pancreatic head lesion. There is abdominal aortic atherosclerosis without aneurysm. SKELETON: Multiple hypermetabolic bone metastases are evident. Hypermetabolic lesion in the left humeral head demonstrates SUV max = 6.5. Multiple hypermetabolic metastases are seen in the spine. L3 lesion demonstrates SUV max = 8.7. Hypermetabolic lesion in the left acetabulum demonstrates SUV max = 7.5 left femoral neck lesion demonstrates SUV max = 8.5. IMPRESSION: 1. Hypermetabolic lymphadenopathy is identified in the thoracic inlet bilaterally, mediastinum, and right hilum with associated hypermetabolic disease in the right infrahilar region. Additionally, hypermetabolic liver metastases, diffuse bone metastases and a 3.9 cm hypermetabolic mass in the head of the pancreas are evident. Imaging findings may well be related to primary pancreatic adenocarcinoma with widespread metastatic involvement. Infrahilar right lung cancer with metastatic disease (including to the pancreas) is also a consideration. No pancreatic mass lesion was visible on the CT scan from 05/24/2017, even in retrospect. There is associated mild intra and  extrahepatic biliary duct dilatation. EUS and/or abdominal MRI/MRCP may prove helpful to further evaluate. 2.  Aortic Atherosclerois (ICD10-170.0) 3.  Emphysema. ([id number]) Electronically Signed   By: Misty Stanley M.D.   On: 10/09/2017 17:22   US Biopsy (liver)  Result Date: 10/17/2017 INDICATION: PANCREAS MASS WITH LIVER METASTASES EXAM: ULTRASOUND GUIDED CORE BIOPSY OF LEFT HEPATIC MASS MEDICATIONS: 1% lidocaine local ANESTHESIA/SEDATION: Fentanyl 50 mcg IV; Versed 1.0 mg IV Moderate Sedation Time:  10 minutes The patient was continuously monitored during the procedure by the interventional radiology nurse under my direct supervision. PROCEDURE: The procedure, risks, benefits, and alternatives were explained to the patient. Questions regarding the procedure were encouraged and answered. The patient understands and consents to the procedure. Previous imaging reviewed. Preliminary ultrasound performed. A left hepatic hypoechoic solid mass was demonstrated in the epigastric region. Overlying skin marked. The epigastric region was prepped with ChloraPrep in a sterile fashion, and a sterile drape was applied covering the operative field. A sterile gown and sterile gloves were  used for the procedure. Local anesthesia was provided with 1% Lidocaine. Under sterile conditions and local anesthesia, a 17 gauge 6.8 cm access needle was advanced from an epigastric percutaneous approach to the left hepatic mass. Needle position confirmed with ultrasound. 3 18 gauge core biopsies obtained through the lesion. Images obtained for documentation. Samples placed in formalin. Needle tract occlusion performed with Gel-Foam. Postprocedure imaging demonstrates no hemorrhage or hematoma. Patient tolerated the biopsy well. COMPLICATIONS: None immediate. FINDINGS: Imaging confirms needle placed in the left hepatic mass in the lateral segment anteriorly. IMPRESSION: Successful CT-guided left hepatic mass 18 gauge core biopsy  Electronically Signed   By: Jerilynn Mages.  Shick M.D.   On: 10/17/2017 13:19   Dg Ercp Biliary & Pancreatic Ducts  Result Date: 10/15/2017 CLINICAL DATA:  Unsuccessful attempted ERCP EXAM: ERCP TECHNIQUE: Multiple spot images obtained with the fluoroscopic device and submitted for interpretation post-procedure. COMPARISON:  MRCP - 10/15/2017 FLUOROSCOPY TIME:  25 minutes, 45 seconds (175.39 mGy) FINDINGS: There are 4 spot intraoperative fluoroscopic images of the right upper abdominal quadrant provided for review Initial image demonstrates an ERCP probe overlying the right upper abdominal quadrant. There is an additional thin drainage catheter overlying the expected location of the pancreatic duct. Subsequent images demonstrate contrast injection without definitive opacification of a recognizable feature of the biliary system. IMPRESSION: 1. Attempted ERCP as above. 2. Additional thin drainage catheter overlies expected location of the pancreatic duct. Above discussed with Dr. Watt Climes prior to completion of the procedure Electronically Signed   By: Sandi Mariscal M.D.   On: 10/15/2017 14:08   Mr Abdomen Mrcp Moise Boring Contast  Result Date: 10/15/2017 CLINICAL DATA:  Metastatic carcinoma of uncertain primary. Abnormal liver function tests and jaundice. EXAM: MRI ABDOMEN WITHOUT AND WITH CONTRAST (INCLUDING MRCP) TECHNIQUE: Multiplanar multisequence MR imaging of the abdomen was performed both before and after the administration of intravenous contrast. Heavily T2-weighted images of the biliary and pancreatic ducts were obtained, and three-dimensional MRCP images were rendered by post processing. CONTRAST:  54m MULTIHANCE GADOBENATE DIMEGLUMINE 529 MG/ML IV SOLN COMPARISON:  PET-CT dated 10/09/2017 and CT abdomen from 05/24/2017 FINDINGS: Lower chest: Airspace opacity and some volume loss in the right lower lobe is again observed with a suspected right infrahilar mass only partially seen today. Borderline cardiomegaly. Small right  and trace left pleural effusions. Hepatobiliary: Moderate intrahepatic biliary dilatation with CBD dilatation up to 1.3 cm in diameter, and abrupt truncation of the common bile duct in the vicinity of the pancreatic head mass. There are approximately 20 hypoenhancing masses scattered in the liver. One lesion in segment 3 of the liver measures 2.7 by 2.0 cm, previously 2.6 by 2.0 cm on 10/09/2017 by my measurement. There is mild gallbladder wall thickening with accentuated precontrast T1 signal in the gallbladder favoring sludge. Pancreas: Hypoenhancing mass in the head of the pancreas measures proximally 3.4 by 3.1 cm on image [id number]. Mildly dilated dorsal pancreatic duct extending to the margin of this mass as shown on image 25/401. Trace peripancreatic edema. Spleen:  Unremarkable.  Trace perisplenic ascites. Adrenals/Urinary Tract: 1.1 by 1.8 cm left adrenal mass was previously hypermetabolic, and probably represents a metastatic lesion. The kidneys appear unremarkable. Trace right greater than left perirenal stranding. Stomach/Bowel: No dilated bowel identified. Vascular/Lymphatic:  Aortoiliac atherosclerotic vascular disease. Other:  No supplemental non-categorized findings. Musculoskeletal: Scattered widespread osseous metastatic lesions including the sternum, ribs, spine, and bony pelvis. L2 compression fracture with associated enhancement, and possibly acute component of fracture. IMPRESSION: 1. 3.4  cm pancreatic head mass is obstructing the common bile duct, which measures up to 1.3 cm in diameter. There is also moderate intrahepatic biliary dilatation. The mass also causes truncation of the dorsal pancreatic duct which is mildly dilated. 2. Approximately 20 metastatic lesions are identified in the liver. 3. Right lower lobe airspace opacity and atelectasis. Suspected right infrahilar mass. 4. Small right and trace left pleural effusions. 5. Small left adrenal mass is suspicious for a metastatic lesion.  6. Scattered widespread osseous metastatic disease. 7. Other imaging findings of potential clinical significance: Aortic Atherosclerosis (ICD10-I70.0). L2 compression fracture as shown on prior MRI. Mild upper abdominal ascites with some trace edema along the mesentery and along several organs. Suspected gallbladder sludge with borderline distended gallbladder. Electronically Signed   By: Van Clines M.D.   On: 10/15/2017 09:55   Ir Radiologist Eval & Mgmt  Result Date: 10/02/2017 Please refer to notes tab for details about interventional procedure. (Op Note)   MRI Lumbar Spine 09/25/17  IMPRESSION:  1. Extensive marrow signal abnormality throughout the osseous structures of the visualized thoracolumbar spine, sacrum, and iliac bones concerning for metastases, multiple myeloma, or other nonspecific marrow infiltrative disorder. Suggest obtaining a PET/CT to evaluate extent of disease/evaluate for a primary tumor.  2. L2 compression fracture, concerning for pathologic fracture given the extensive marrow abnormality with approximately 40% vertebral body height loss and 8.12m of retropulsion resulting in moderate narrowing of the spinal canal. A postcontrast MRI with fat saturation would help to evaluate the enhancements pattern of the bony disease and evaluate for potential abnormal soft issue extending beyond the cortex which is not definitively evident in this exam.  3. Moderate facet arthrophy with grade 1 anterolisthesis at L4-5.  Degenerative disc disease with moderate left foraminal stenosis at L5-S1.     ASSESSMENT & PLAN:   SNICKOLE ADAMEKis a 67y.o. caucasian female with   1. 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  Liver mets + Bone mets + adrenal mets Pancreatic mets- FNA confirmed MRI brain 10/29/2017 after  this clinic visit showed concern for multiple asymptomatic subcentimeter brain mets.  Plan: -discussed available labs today. -Discussed pt labwork today; liver numbers are still elevated but are trending in the right direction a few days out from her ERCP -Carboplatin/Etoposide/Atezolizumab starting today with neulasta.-ordered and signed. -patient visit in infusion multiple times for treatment monitoring. --XMarchelle Folks-subsequently on 10/29/2017 -- discussed MRI brain results with patient - showing multiple asymptomatic subcentimeter brain mets.  -might later need Rad onc input for consideration of WBRT -Recommended Zofran for nausea first, Compazine or Ativan second.  -Continue to take Miralax and move Senna S to just at night.  -Discussed Neulasta shot regimen -Her port will be placed on 11/06/17 -Discussed up to 6 cycles of chemotherapy followed by maintenance Atezolizumab.  -Recommended safety precautions: using a mask when in public.   2. 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 RHarley Hallmarkwith much improved LFTs   5. Back pain related to pathologic  L2 fracture -continue using back brace -continue prn Oxycodone with GI prophylaxis -will hold of on interventional procedure to L2 at this time. -Per patient's clinical presentation; her hip and leg pain is likely nerve pinching due to compression fractures.  -Discussed treating her back pain with steroids.  -Adding Oxycontin, and keeping short term Oxycodone prn. -Opiate related constipation: advised increasing Senna S to twice a day and using Miralax as well;  one time OTC Magnesium Citrate if she still cannot move bowels.     5. Smoking history, productive cough  Smoked for 50 years, 1 pack a day  Has not had a Chest Xray or scan since 2008 She currently has moderate cough with clear phlegm ever since episode of bronchitis. She takes uses albuterol inhaler as needed.  Plan: -Smoking cessation  6. 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 Arvil Chaco Pawa   7. Moderate to sever protein malnutrition due to weight loss -nutritional consultation   Please schedule C2 of Carboplatin/Etoposide/Atezolizumab Patient will proceed for C1 of treatment from today Continue Xgeva q4weeks MRI brain - ASAP (not scheduled from last time) RTC with Dr Irene Limbo in 10 days with labs for toxicity check   All of the patients questions were answered with apparent satisfaction. The patient knows to call the clinic with any problems, questions or concerns.  . The total time spent in the appointment was 40 minutes and more than 50% was on counseling and direct patient cares and co-ordination of care with multiple specialists.     Sullivan Lone MD [place] AAHIVMS [facility] Hematology/Oncology Physician [facility]  (Office):       [phone] (Work cell):  [phone] (Fax):           [phone]  10/27/2017 10:38 AM  This document serves as a record of services personally performed by Sullivan Lone, MD. It was created on his behalf by Baldwin Jamaica, a trained medical scribe. The creation of this record is based on the scribe's personal observations and the provider's statements to them.   .I have reviewed the above documentation for accuracy and completeness, and I agree with the above. Brunetta Genera MD

## 2017-10-28 ENCOUNTER — Inpatient Hospital Stay: Payer: No Typology Code available for payment source

## 2017-10-28 VITALS — BP 112/81 | HR 76 | Temp 98.1°F | Resp 20

## 2017-10-28 DIAGNOSIS — Z7189 Other specified counseling: Secondary | ICD-10-CM

## 2017-10-28 DIAGNOSIS — C3490 Malignant neoplasm of unspecified part of unspecified bronchus or lung: Secondary | ICD-10-CM

## 2017-10-28 DIAGNOSIS — Z5112 Encounter for antineoplastic immunotherapy: Secondary | ICD-10-CM | POA: Diagnosis not present

## 2017-10-28 MED ORDER — SODIUM CHLORIDE 0.9 % IV SOLN
Freq: Once | INTRAVENOUS | Status: AC
  Administered 2017-10-28: 08:00:00 via INTRAVENOUS

## 2017-10-28 MED ORDER — DEXAMETHASONE SODIUM PHOSPHATE 10 MG/ML IJ SOLN
10.0000 mg | Freq: Once | INTRAMUSCULAR | Status: AC
  Administered 2017-10-28: 10 mg via INTRAVENOUS

## 2017-10-28 MED ORDER — SODIUM CHLORIDE 0.9 % IV SOLN
100.0000 mg/m2 | Freq: Once | INTRAVENOUS | Status: AC
  Administered 2017-10-28: 150 mg via INTRAVENOUS
  Filled 2017-10-28: qty 7.5

## 2017-10-28 MED ORDER — SODIUM CHLORIDE 0.9 % IV SOLN: 10.0000 mg | Freq: Once | INTRAVENOUS | Status: DC

## 2017-10-28 MED ORDER — DEXAMETHASONE SODIUM PHOSPHATE 10 MG/ML IJ SOLN
INTRAMUSCULAR | Status: AC
  Filled 2017-10-28: qty 1

## 2017-10-28 NOTE — Progress Notes (Signed)
Nutrition Assessment  Reason for Assessment: Malnutrition Screening Tool    ASSESSMENT:   67 year old female with lung primary with liver, adrenal, and bone mets. Also noted pancreatic mass noted and stent placed on 10/24/17.  Past medical history of L2 fracture.    Met with patient and sister during infusion today.  Patient reports appetite is better but has been poor since the end of December.  Reports she thinks appetite was effected due to blockage from mass.  Feels that since she has gotten stent placed appetite is better.  Reports typically has small bowel of cereal, yesterday ate 1/3rd of meatball sub for lunch and ate another 1/3rd of it for dinner with chips and cookie.  Reports no abdominal pain.  Has tried boost shakes.      NUTRITION - FOCUSED PHYSICAL EXAM: Nutrition Assessment and Review - 10/28/17 1229      Subcutaneous Fat   Orbital Region  Moderate depletion    Upper Arm Region  No depletion    Thoracic and Lumbar Region  Mild depletion    Buccal Region  Mild depletion      Muscle   Temple Region  Moderate depletion    Clavicle Bone Region  Moderate depletion    Clavicle and Acromion Bone Region  Moderate depletion    Scapular Bone Region  Unable to assess    Dorsal Hand  Mild depletion    Patellar Region  Mild depletion    Anterior Thigh Region  Mild depletion    Posterior Calf Region  Mild depletion      Edema RD   Edema (RD Assessment)  None        MEDICATIONS: decadron, ativan, zofran, miralax, compazine   LABS: reviewed   ANTHROPOMETRICS: Height:   63 inches Weight:   112 lb 4.8 oz on 10/27/17 BMI:  There is no height or weight on file to calculate BMI. UBW:    125 lb      ESTIMATED ENERGY NEEDS:  Kcal:  1500-1700  Protein: 77-89 g Fluid:  1.7 L/d   NUTRITION DIAGNOSIS:  Severe Malnutrition related to poor appetite, cancer and cancer related treatments as evidenced by percent weight loss, energy intake < or equal to 75% for > or equal to 1  month.   DOCUMENTATION CODES:  Severe malnutrition in context of chronic illness   INTERVENTION:   Discussed high calorie, high protein foods with patient and sister.  Education materials provided. Encouraged small frequent meals. Encouraged 1 ensure plus/boost plus shake daily.  Samples and coupons given  GOAL:  Patient will meet greater than or equal to 90% of their needs   MONITOR:  PO intake, Weight trends   Next Visit: March 26 during infusion  Colena Ketterman B. Zenia Resides, [place], [place] Registered Dietitian [phone] (pager)

## 2017-10-28 NOTE — Patient Instructions (Signed)
[facility] Discharge Instructions for Patients Receiving Chemotherapy  Today you received the following chemotherapy agents: Carbo, Etoposide, Tecentriq  To help prevent nausea and vomiting after your treatment, we encourage you to take your nausea medication as prescribed.   If you develop nausea and vomiting that is not controlled by your nausea medication, call the clinic.   BELOW ARE SYMPTOMS THAT SHOULD BE REPORTED IMMEDIATELY:  *FEVER GREATER THAN 100.5 F  *CHILLS WITH OR WITHOUT FEVER  NAUSEA AND VOMITING THAT IS NOT CONTROLLED WITH YOUR NAUSEA MEDICATION  *UNUSUAL SHORTNESS OF BREATH  *UNUSUAL BRUISING OR BLEEDING  TENDERNESS IN MOUTH AND THROAT WITH OR WITHOUT PRESENCE OF ULCERS  *URINARY PROBLEMS  *BOWEL PROBLEMS  UNUSUAL RASH Items with * indicate a potential emergency and should be followed up as soon as possible.  Feel free to call the clinic should you have any questions or concerns. The clinic phone number is [phone].  Please show the [place] at check-in to the Emergency Department and triage nurse.

## 2017-10-29 ENCOUNTER — Inpatient Hospital Stay: Payer: No Typology Code available for payment source

## 2017-10-29 ENCOUNTER — Ambulatory Visit
Admission: RE | Admit: 2017-10-29 | Discharge: 2017-10-29 | Disposition: A | Discharge status: Home / Self Care | Payer: 59 | Source: Ambulatory Visit | Attending: Hematology | Admitting: Hematology

## 2017-10-29 ENCOUNTER — Other Ambulatory Visit: Payer: Self-pay | Admitting: Hematology

## 2017-10-29 VITALS — BP 103/47 | HR 53 | Temp 98.1°F | Resp 18

## 2017-10-29 DIAGNOSIS — C801 Malignant (primary) neoplasm, unspecified: Secondary | ICD-10-CM

## 2017-10-29 DIAGNOSIS — C3490 Malignant neoplasm of unspecified part of unspecified bronchus or lung: Secondary | ICD-10-CM

## 2017-10-29 DIAGNOSIS — C787 Secondary malignant neoplasm of liver and intrahepatic bile duct: Secondary | ICD-10-CM

## 2017-10-29 DIAGNOSIS — Z5112 Encounter for antineoplastic immunotherapy: Secondary | ICD-10-CM | POA: Diagnosis not present

## 2017-10-29 DIAGNOSIS — Z7189 Other specified counseling: Secondary | ICD-10-CM

## 2017-10-29 DIAGNOSIS — K831 Obstruction of bile duct: Principal | ICD-10-CM

## 2017-10-29 MED ORDER — SODIUM CHLORIDE 0.9 % IV SOLN: 10.0000 mg | Freq: Once | INTRAVENOUS | Status: DC

## 2017-10-29 MED ORDER — ETOPOSIDE CHEMO INJECTION 1 GM/50ML
100.0000 mg/m2 | Freq: Once | INTRAVENOUS | Status: AC
  Administered 2017-10-29: 150 mg via INTRAVENOUS
  Filled 2017-10-29: qty 7.5

## 2017-10-29 MED ORDER — DEXAMETHASONE SODIUM PHOSPHATE 10 MG/ML IJ SOLN
10.0000 mg | Freq: Once | INTRAMUSCULAR | Status: AC
  Administered 2017-10-29: 10 mg via INTRAVENOUS

## 2017-10-29 MED ORDER — GADOBENATE DIMEGLUMINE 529 MG/ML IV SOLN
10.0000 mL | Freq: Once | INTRAVENOUS | Status: AC | PRN
  Administered 2017-10-29: 10 mL via INTRAVENOUS

## 2017-10-29 MED ORDER — SODIUM CHLORIDE 0.9 % IV SOLN
Freq: Once | INTRAVENOUS | Status: AC
  Administered 2017-10-29: 17:00:00 via INTRAVENOUS

## 2017-10-29 MED ORDER — DEXAMETHASONE SODIUM PHOSPHATE 10 MG/ML IJ SOLN
INTRAMUSCULAR | Status: AC
  Filled 2017-10-29: qty 1

## 2017-10-29 MED ORDER — HEPARIN SOD (PORK) LOCK FLUSH 100 UNIT/ML IV SOLN
500.0000 [IU] | Freq: Once | INTRAVENOUS | Status: AC | PRN
  Administered 2017-10-29: 500 [IU]
  Filled 2017-10-29: qty 5

## 2017-10-29 MED ORDER — SODIUM CHLORIDE 0.9 % IV SOLN
Freq: Once | INTRAVENOUS | Status: AC
  Administered 2017-10-29: 16:00:00 via INTRAVENOUS

## 2017-10-29 MED ORDER — SODIUM CHLORIDE 0.9% FLUSH
10.0000 mL | INTRAVENOUS | Status: DC | PRN
  Administered 2017-10-29: 10 mL
  Filled 2017-10-29: qty 10

## 2017-10-29 NOTE — Patient Instructions (Signed)
[facility] Discharge Instructions for Patients Receiving Chemotherapy  Today you received the following chemotherapy agents: etoposide (Vepesid)  To help prevent nausea and vomiting after your treatment, we encourage you to take your nausea medication as prescribed.   If you develop nausea and vomiting that is not controlled by your nausea medication, call the clinic.   BELOW ARE SYMPTOMS THAT SHOULD BE REPORTED IMMEDIATELY:  *FEVER GREATER THAN 100.5 F  *CHILLS WITH OR WITHOUT FEVER  NAUSEA AND VOMITING THAT IS NOT CONTROLLED WITH YOUR NAUSEA MEDICATION  *UNUSUAL SHORTNESS OF BREATH  *UNUSUAL BRUISING OR BLEEDING  TENDERNESS IN MOUTH AND THROAT WITH OR WITHOUT PRESENCE OF ULCERS  *URINARY PROBLEMS  *BOWEL PROBLEMS  UNUSUAL RASH Items with * indicate a potential emergency and should be followed up as soon as possible.  Feel free to call the clinic should you have any questions or concerns. The clinic phone number is [phone].  Please show the [place] at check-in to the Emergency Department and triage nurse.

## 2017-10-31 ENCOUNTER — Inpatient Hospital Stay: Payer: No Typology Code available for payment source

## 2017-10-31 VITALS — BP 109/61 | HR 73 | Temp 97.8°F | Resp 20

## 2017-10-31 DIAGNOSIS — C3490 Malignant neoplasm of unspecified part of unspecified bronchus or lung: Secondary | ICD-10-CM

## 2017-10-31 DIAGNOSIS — C7951 Secondary malignant neoplasm of bone: Secondary | ICD-10-CM

## 2017-10-31 DIAGNOSIS — Z5112 Encounter for antineoplastic immunotherapy: Secondary | ICD-10-CM | POA: Diagnosis not present

## 2017-10-31 DIAGNOSIS — Z7189 Other specified counseling: Secondary | ICD-10-CM

## 2017-10-31 MED ORDER — DENOSUMAB 120 MG/1.7ML ~~LOC~~ SOLN
SUBCUTANEOUS | Status: AC
  Filled 2017-10-31: qty 1.7

## 2017-10-31 MED ORDER — DENOSUMAB 120 MG/1.7ML ~~LOC~~ SOLN
120.0000 mg | Freq: Once | SUBCUTANEOUS | Status: AC
  Administered 2017-10-31: 120 mg via SUBCUTANEOUS

## 2017-10-31 NOTE — Patient Instructions (Signed)

## 2017-11-03 ENCOUNTER — Ambulatory Visit: Payer: 59

## 2017-11-03 ENCOUNTER — Inpatient Hospital Stay: Payer: No Typology Code available for payment source

## 2017-11-03 ENCOUNTER — Other Ambulatory Visit: Payer: Self-pay | Admitting: *Deleted

## 2017-11-03 ENCOUNTER — Other Ambulatory Visit: Payer: Self-pay | Admitting: Radiology

## 2017-11-03 ENCOUNTER — Other Ambulatory Visit: Payer: Self-pay

## 2017-11-03 ENCOUNTER — Inpatient Hospital Stay (HOSPITAL_BASED_OUTPATIENT_CLINIC_OR_DEPARTMENT_OTHER): Payer: No Typology Code available for payment source | Admitting: Medical

## 2017-11-03 VITALS — BP 95/53 | HR 71 | Temp 97.7°F | Resp 16 | Ht 63.0 in | Wt 103.3 lb

## 2017-11-03 DIAGNOSIS — E875 Hyperkalemia: Secondary | ICD-10-CM | POA: Diagnosis not present

## 2017-11-03 DIAGNOSIS — K209 Esophagitis, unspecified without bleeding: Secondary | ICD-10-CM

## 2017-11-03 DIAGNOSIS — E860 Dehydration: Secondary | ICD-10-CM

## 2017-11-03 DIAGNOSIS — K831 Obstruction of bile duct: Secondary | ICD-10-CM

## 2017-11-03 DIAGNOSIS — C3490 Malignant neoplasm of unspecified part of unspecified bronchus or lung: Secondary | ICD-10-CM

## 2017-11-03 DIAGNOSIS — Z5112 Encounter for antineoplastic immunotherapy: Secondary | ICD-10-CM | POA: Diagnosis not present

## 2017-11-03 DIAGNOSIS — B370 Candidal stomatitis: Secondary | ICD-10-CM

## 2017-11-03 DIAGNOSIS — C7951 Secondary malignant neoplasm of bone: Secondary | ICD-10-CM

## 2017-11-03 DIAGNOSIS — C801 Malignant (primary) neoplasm, unspecified: Secondary | ICD-10-CM

## 2017-11-03 LAB — CBC WITH DIFFERENTIAL (CANCER CENTER ONLY)
Basophils Absolute: 0 10*3/uL (ref 0.0–0.1)
Basophils Relative: 1 %
EOS ABS: 0 10*3/uL (ref 0.0–0.5)
Eosinophils Relative: 1 %
HCT: 37.9 % (ref 34.8–46.6)
HEMOGLOBIN: 12.7 g/dL (ref 11.6–15.9)
LYMPHS ABS: 1.2 10*3/uL (ref 0.9–3.3)
LYMPHS PCT: 35 %
MCH: 29.6 pg (ref 25.1–34.0)
MCHC: 33.6 g/dL (ref 31.5–36.0)
MCV: 88.2 fL (ref 79.5–101.0)
Monocytes Absolute: 0.1 10*3/uL (ref 0.1–0.9)
Monocytes Relative: 3 %
NEUTROS ABS: 2.1 10*3/uL (ref 1.5–6.5)
Neutrophils Relative %: 60 %
Platelet Count: 227 10*3/uL (ref 145–400)
RBC: 4.3 MIL/uL (ref 3.70–5.45)
RDW: 13.5 % (ref 11.2–14.5)
WBC: 3.5 10*3/uL — AB (ref 3.9–10.3)

## 2017-11-03 LAB — CMP (CANCER CENTER ONLY)
ALT: 65 U/L — ABNORMAL HIGH (ref 0–55)
ANION GAP: 9 (ref 3–11)
AST: 84 U/L — ABNORMAL HIGH (ref 5–34)
Albumin: 3 g/dL — ABNORMAL LOW (ref 3.5–5.0)
Alkaline Phosphatase: 225 U/L — ABNORMAL HIGH (ref 40–150)
BUN: 21 mg/dL (ref 7–26)
CHLORIDE: 103 mmol/L (ref 98–109)
CO2: 25 mmol/L (ref 22–29)
Calcium: 9.3 mg/dL (ref 8.4–10.4)
Creatinine: 0.79 mg/dL (ref 0.60–1.10)
Glucose, Bld: 116 mg/dL (ref 70–140)
POTASSIUM: 6.1 mmol/L — AB (ref 3.5–5.1)
SODIUM: 137 mmol/L (ref 136–145)
Total Bilirubin: 2.5 mg/dL — ABNORMAL HIGH (ref 0.2–1.2)
Total Protein: 7.2 g/dL (ref 6.4–8.3)

## 2017-11-03 LAB — BASIC METABOLIC PANEL - CANCER CENTER ONLY
Anion gap: 10 (ref 3–11)
BUN: 18 mg/dL (ref 7–26)
CALCIUM: 8.2 mg/dL — AB (ref 8.4–10.4)
CO2: 23 mmol/L (ref 22–29)
CREATININE: 0.69 mg/dL (ref 0.60–1.10)
Chloride: 107 mmol/L (ref 98–109)
Glucose, Bld: 128 mg/dL (ref 70–140)
Potassium: 4.2 mmol/L (ref 3.5–5.1)
SODIUM: 140 mmol/L (ref 136–145)

## 2017-11-03 LAB — LACTATE DEHYDROGENASE: LDH: 416 U/L — AB (ref 125–245)

## 2017-11-03 LAB — MAGNESIUM: MAGNESIUM: 2.5 mg/dL — AB (ref 1.7–2.4)

## 2017-11-03 MED ORDER — FLUCONAZOLE 100 MG PO TABS: 100.0000 mg | ORAL_TABLET | Freq: Every day | ORAL | 0 refills | Status: DC

## 2017-11-03 MED ORDER — MAGIC MOUTHWASH W/LIDOCAINE
10.0000 mL | Freq: Four times a day (QID) | ORAL | 2 refills | Status: DC | PRN
Start: 2017-11-03 — End: 2017-12-08

## 2017-11-03 MED ORDER — SODIUM CHLORIDE 0.9 % IV SOLN
Freq: Once | INTRAVENOUS | Status: AC
  Administered 2017-11-03: 10:00:00 via INTRAVENOUS

## 2017-11-03 MED ORDER — GI COCKTAIL ~~LOC~~
30.0000 mL | Freq: Once | ORAL | Status: AC
  Administered 2017-11-03: 30 mL via ORAL
  Filled 2017-11-03: qty 30

## 2017-11-03 MED ORDER — GI COCKTAIL ~~LOC~~
30.0000 mL | Freq: Once | ORAL | Status: DC
  Filled 2017-11-03: qty 30

## 2017-11-03 NOTE — Progress Notes (Signed)
HEMATOLOGY/ONCOLOGY CLINIC NOTE  Date of Service: 11/05/2017  Patient Care Team: [facility], Modena Nunnery, MD as PCP - General (Family Medicine)  CHIEF COMPLAINTS/PURPOSE OF CONSULTATION:   F/u for continued management of extensive stage small cell lung cancer.  HISTORY OF PRESENTING ILLNESS:   Haley Haley is Haley Haley 67 y.o. female who has been referred to [place] by Orthopedists Dr. Vickki Hearing for evaluation and management of Bone Marrow Abnormality. Her PCP is Dr. Buelah Manis.   She presents to the clinic today accompanied by her sons and her daughter-in-law. She notes she has several issues since October 2018.  She had colitis in 05/2017 with significant diarrhea and abdominal cramping --now mostly resolved.  She was treated with antibiotics. Afterward she had bronchitis. She was given albuterol and continues to take it as she has remaining cough (smoking contributed to this).   On 08/07/17 she notes she has back pain in her lower back the past couple of months. 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 Next day she saw her physician, who had done Haley xray and told her she had arthritis. She was treated with prednisone and gabapentin. After no back pain improvements on treatment, she has yet to return to work. She went to orthopedist and had Haley Lumbar MRI on 09/25/17. This shows Haley compression fracture of the L2 with abnormalities in her bone marrow.   She quit smoking 09/24/17. She had been smoking 1 pack Haley day for 50 years, since she was 67 yo. She still has cough from smoking but not as significant.   She denies heart conditions or disease, GI issues or diseases or other significant medical issues. DM runs in her family but no known cancers, blood disorders, or gene mutations. Prior to her health in the past 6 months she has lead Haley independent overall healthy life. Her last mammogram in 04/2017 was negative. Last colonoscopy in 2015 and last Papsmear in 2017, were negative.  She had Haley bone density scan in 2009. She has never been on HR therapy.   Given her significant pain (up to 10/10). She currently takes hydrocodone 1 tablet q4-5hours. This only reduced her pain to 7-8/10. She takes Tizanidine 49m q5-6hours. She has Haley back brace that she does not use.   On review of symptoms, pt notes ongoing b/l hip pain, lower back pain which can radiate up the side and down her legs and buttocks. The pain can keep her up at night. She notes when she lays on her right side, her throat pain which causes her to cough. This started around time of bronchitis and has not improved. When she does cough she produces clear phlegm.    Interval History:   Haley ARDELEANreturns today regarding her newly diagnosed extensive stage metastatic small cell lung cancer. The patient's last visit with [place] on 10/27/17. She is accompanied today by her sister and son. The pt reports that she is doing well overall.  She is here for Haley toxicity check. No prohibitive ctx toxicities.  Of note since the patient's last visit, pt has had MRI Brain completed on 10/29/17 with results revealing 1. Positive for multiple subcentimeter brain metastases as recorded above. No associated mass effect or edema. 2. Multifocal osseous metastatic disease.  She notes that she saw VSandi Mealy P.Haley on 11/03/17 after developing thrush. She has begun magic mouthwash and Diflucan on 11/03/17 for her newly developed thrush. She says it is too  soon to tell if it is helping her or not yet. She notes that she is using Boost or Ensure but has not been able to eat well since her developed thrush.  She will get her next Xgeva shot for her next cycle of chemotherapy.  She notes that she wants fluids at her portacath placement.   She notes that her back pain has been well controlled with her Oxycontin regimen. She notes that she is moving her bowels at least once Haley day and is continuing her Senna S.   She notes that she had Haley shingles  vaccination.   Lab results today (11/05/17) of CBC, CMP, and Reticulocytes is as follows: all values are WNL except for WBC at 2.4k, Hgb at 10.9, HCT at 33.2, Platelets at 130k, Neutro Abs at 0.8k, Albumin at 2.9, AST at 81, Alk Phos at 205, Total Bilirubin at 2.2.  On review of systems, pt reports well controlled back pain, thrush, throat pain, and denies nausea, and any other symptoms.   MEDICAL HISTORY:  Past Medical History:  Diagnosis Date  . Allergy   . Osteopenia     SURGICAL HISTORY: Past Surgical History:  Procedure Laterality Date  . COLONOSCOPY    . COLONOSCOPY WITH PROPOFOL N/Haley 10/25/2014   Procedure: COLONOSCOPY WITH PROPOFOL;  Surgeon: Garlan Fair, MD;  Location: [facility]COPY;  Service: Endoscopy;  Laterality: N/Haley;  . ERCP N/Haley 10/15/2017   Procedure: ENDOSCOPIC RETROGRADE CHOLANGIOPANCREATOGRAPHY (ERCP);  Surgeon: Clarene Essex, MD;  Location: [facility];  Service: Endoscopy;  Laterality: N/Haley;  . IR RADIOLOGIST EVAL & MGMT  10/02/2017    SOCIAL HISTORY: Social History   Socioeconomic History  . Marital status: Single    Spouse name: Not on file  . Number of children: Not on file  . Years of education: Not on file  . Highest education level: Not on file  Social Needs  . Financial resource strain: Not on file  . Food insecurity - worry: Not on file  . Food insecurity - inability: Not on file  . Transportation needs - medical: Not on file  . Transportation needs - non-medical: Not on file  Occupational History  . Not on file  Tobacco Use  . Smoking status: Former Smoker    Packs/day: 1.00    Years: 40.00    Pack years: 40.00    Types: Cigarettes    Last attempt to quit: 09/24/2017    Years since quitting: 0.1  . Smokeless tobacco: Never Used  Substance and Sexual Activity  . Alcohol use: Yes    Comment: rare social  . Drug use: No  . Sexual activity: Not Currently  Other Topics Concern  . Not on file  Social History Narrative  . Not on file     FAMILY HISTORY: Family History  Problem Relation Age of Onset  . Diabetes Mother   . Hypertension Mother   . Diabetes Sister   . Hypertension Sister   . Hyperlipidemia Sister     ALLERGIES:  has No Known Allergies.  MEDICATIONS:  Current Outpatient Medications  Medication Sig Dispense Refill  . dexamethasone (DECADRON) 4 MG tablet Take 1 tablet (4 mg total) by mouth daily with breakfast. 20 tablet 0  . fluconazole (DIFLUCAN) 100 MG tablet Take 1 tablet (100 mg total) by mouth daily. 10 tablet 0  . lidocaine-prilocaine (EMLA) cream Apply to affected area once 30 g 3  . LORazepam (ATIVAN) 0.5 MG tablet Take 1 tablet (0.5 mg total) by  mouth every 6 (six) hours as needed (Nausea or vomiting). 30 tablet 0  . magic mouthwash w/lidocaine SOLN Take 10 mLs by mouth 4 (four) times daily as needed for mouth pain. Rinse and spit or swallow 240 mL 2  . ondansetron (ZOFRAN) 4 MG tablet Take 1 tablet (4 mg total) by mouth every 8 (eight) hours as needed for nausea or vomiting. 20 tablet 0  . oxyCODONE (OXY IR/ROXICODONE) 5 MG immediate release tablet Take 1-2 tablets (5-10 mg total) by mouth every 4 (four) hours as needed for severe pain. 60 tablet 0  . oxyCODONE (OXYCONTIN) 15 mg 12 hr tablet Take 1 tablet (15 mg total) by mouth every 12 (twelve) hours. 60 tablet 0  . polyethylene glycol (MIRALAX) packet Take 17 g by mouth daily as needed for moderate constipation. 30 each 0  . prochlorperazine (COMPAZINE) 10 MG tablet Take 1 tablet (10 mg total) by mouth every 6 (six) hours as needed (Nausea or vomiting). 30 tablet 1  . senna-docusate (SENNA S) 8.6-50 MG tablet Take 2 tablets by mouth at bedtime. 60 tablet 1  . tiZANidine (ZANAFLEX) 4 MG tablet Take 1 tablet (4 mg total) by mouth every 8 (eight) hours as needed for muscle spasms. 30 tablet 0   No current facility-administered medications for this visit.     REVIEW OF SYSTEMS:    .10 Point review of Systems was done is negative except as  noted above.   PHYSICAL EXAMINATION: ECOG PERFORMANCE STATUS: 2 - Symptomatic, <50% confined to bed  . Vitals:   11/05/17 0934  BP: (!) 95/58  Pulse: 80  Resp: 20  Temp: 97.7 F (36.5 C)  SpO2: 100%   Filed Weights   11/05/17 0934  Weight: 102 lb 6.4 oz (46.4 kg)   .Body mass index is 18.14 kg/m.  Marland Kitchen GENERAL:alert, in no acute distress and comfortable SKIN: no acute rashes, no significant lesions EYES: conjunctiva are pink and non-injected, sclera anicteric OROPHARYNX: MMM, no exudates, no oropharyngeal erythema or ulceration NECK: supple, no JVD LYMPH:  no palpable lymphadenopathy in the cervical, axillary or inguinal regions LUNGS: clear to auscultation b/l with normal respiratory effort HEART: regular rate & rhythm ABDOMEN:  normoactive bowel sounds , non tender, not distended. Extremity: no pedal edema PSYCH: alert & oriented x 3 with fluent speech NEURO: no focal motor/sensory deficits  LABORATORY DATA:   . CBC Latest Ref Rng & Units 11/05/2017 11/03/2017 10/27/2017  WBC 3.9 - 10.3 K/uL 2.4(L) 3.5(L) 8.0  Hemoglobin 11.6 - 15.9 g/dL 10.9(L) - 12.1  Hematocrit 34.8 - 46.6 % 33.2(L) 37.9 34.7(L)  Platelets 145 - 400 K/uL 130(L) 227 181    . CMP Latest Ref Rng & Units 11/05/2017 11/03/2017 11/03/2017  Glucose 70 - 140 mg/dL 122 128 116  BUN 7 - 26 mg/dL 20 18 21   Creatinine 0.60 - 1.10 mg/dL 0.75 0.69 0.79  Sodium 136 - 145 mmol/L 139 140 137  Potassium 3.5 - 5.1 mmol/L 5.1 4.2 6.1(HH)  Chloride 98 - 109 mmol/L 106 107 103  CO2 22 - 29 mmol/L 26 23 25   Calcium 8.4 - 10.4 mg/dL 9.6 8.2(L) 9.3  Total Protein 6.4 - 8.3 g/dL 6.8 - 7.2  Total Bilirubin 0.2 - 1.2 mg/dL 2.2(H) - 2.5(H)  Alkaline Phos 40 - 150 U/L 205(H) - 225(H)  AST 5 - 34 U/L 81(H) - 84(H)  ALT 0 - 55 U/L 55 - 65(H)     10/17/17 Liver needle/core biopsy   Fine Needle Aspiration,  EUS, Pancreas3/12/2017 [facility] Result Narrative  ACCESSION NUMBER: [id number] RECEIVED:  10/24/2017 ORDERING PHYSICIAN: Harley Hallmark , MD PATIENT NAME: Haley Haley CYTOLOGY REPORT  Final Cytologic Interpretation  Pancreas, endoscopic ultrasound-guided, fine needle aspiration II (smears and cell block): 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  Specimen Adequacy:Satisfactory for evaluation.    COMMENT:The smears show clusters of neoplastic cells with neuroendocrine nuclear features and scant cytoplasm.  Immunoperoxidase studies on limited cell block material show the neoplastic cells stain positive for synaptophysin, CD56, cytokeratin AE1/AE3 (Golgi-dot like staining) and TTF-1. The MIB-1 shows Haley high proliferation index with positive staining in approximately 80% of the cells. Together with the cytomorphology this supports the diagnosis of small cell carcinoma. Given the presence of Haley suspected right infrahilar lung mass on MRI per [facility] and Haley pancreatic mass, I cannot be certain of the site of origin and whether this represents Haley primary pancreatic small cell poorly differentiated endocrine carcinoma versus Haley metastatic small cell carcinoma of lung origin (both can express TTF-1). Clinical correlation advised.  Dr. Romilda Garret has reviewed the case in consultation and agrees with the diagnosis of small cell carcinoma.   The positive controls worked appropriately   "These tests were developed and their performance characteristics determined by [facility].They have not been cleared or approved by the U.S. Food and Drug Administration.The FDA has determined that such clearance or approval is not necessary.These tests are used for clinical purposes.They should not be regarded as investigational or for research.This laboratory is certified under the [place] (CLIA) as qualified to perform high complexity  clinical laboratory testing."   Report Prepared By:O.Haley. Sheryle Hail, M.D.  I have personally reviewed the slides and/or other related materials referenced, and have edited the report as part of my pathologic assessment and final interpretation.  Electronically Signed Out By: Blythe Stanford , MD 10/28/2017 20:55:03  sb/oah  Specimen(s) Received:  Pancreas, endoscopic ultrasound-guided, fine needle aspiration II (smears and cell block)  Clinical History obstructive jaundice  Preliminary (on-site) Interpretation  Adequate. L.Cox CT(ASCP) 10/24/17     RADIOGRAPHIC STUDIES: I have personally reviewed the radiological images as listed and agreed with the findings in the report. Mr Jeri Cos Wo Contrast  Result Date: 10/29/2017 CLINICAL DATA:  Staging of small cell lung cancer. EXAM: MRI HEAD WITHOUT AND WITH CONTRAST TECHNIQUE: Multiplanar, multiecho pulse sequences of the brain and surrounding structures were obtained without and with intravenous contrast. CONTRAST:  42m MULTIHANCE GADOBENATE DIMEGLUMINE 529 MG/ML IV SOLN COMPARISON:  None. FINDINGS: Brain: Positive for metastatic disease. The patient's brain metastases are all subcentimeter and show no significant vasogenic edema. The metastases are best seen on diffusion imaging as they are subtly enhancing. Post-contrast visualization is best on the sagittal acquisition, which is also the latest. At least 12 brain metastases are seen in labeled on series 77. Many these are visible on series 15. Two punctate enhancing lesions seen on series 15 are not clearly seen on diffusion imaging, located right frontal on 6:15 and left periventricular on 14:15. This gives Haley total of 14 visualized brain metastases. No evidence of acute infarct, hemorrhage, hydrocephalus, or collection. Vascular: Major flow voids are preserved. Skull and upper cervical spine: Infiltrative marrow lesions seen in the clivus, C2 body, C2 left lateral mass, C3 left articular  process, and C4 body and left articular process. Calvarial metastases are also noted. Sinuses/Orbits: Remote blowout fracture of the medial wall left orbit.  No evidence of orbital metastatic disease. Other: These results will be called to the ordering clinician or representative by the Radiologist Assistant, and communication documented in the PACS or zVision Dashboard. IMPRESSION: 1. Positive for multiple subcentimeter brain metastases as recorded above. No associated mass effect or edema. 2. Multifocal osseous metastatic disease. Electronically Signed   By: Monte Fantasia M.D.   On: 10/29/2017 11:26   Mr Lumbar Spine W Wo Contrast  Result Date: 10/06/2017 CLINICAL DATA:  67 y/o F; bone metastasis with pathologic fracture of L2 vertebral body. Numbness and tingling beginning to radiate down her legs. EXAM: MRI LUMBAR SPINE WITHOUT AND WITH CONTRAST TECHNIQUE: Multiplanar and multiecho pulse sequences of the lumbar spine were obtained without and with intravenous contrast. CONTRAST:  17m MULTIHANCE GADOBENATE DIMEGLUMINE 529 MG/ML IV SOLN COMPARISON:  09/24/2017 MRI of the lumbar spine. 10/05/2017 CT of the chest. FINDINGS: Segmentation:  Standard. Alignment:  Physiologic. Vertebrae: Diffuse low T1 signal with mild enhancement throughout the visible lumbar spine and sacrum patchy areas of fatty marrow sparing. Stable moderate compression deformity of L2 vertebral body with 7 mm retropulsion of superior endplate. Stable mild irregular enhancement of the L2 vertebral body. No new compression deformity. No epidural disease. Conus medullaris and cauda equina: Conus extends to the L1 level. Conus and cauda equina appear normal. Paraspinal and other soft tissues: Numerous masses throughout the liver measuring up to 23 mm in segment 2 and ill-defined nodule in left adrenal gland, likely metastatic disease. Disc levels: L1-2: Retropulsion of L2 superior endplate results in stable moderate canal stenosis. No foraminal  stenosis. L2-3: No significant disc displacement, foraminal stenosis, or canal stenosis. L3-4: Small disc bulge with mild facet and ligamentum flavum hypertrophy. No significant foraminal or canal stenosis. L4-5: Small disc bulge with moderate facet hypertrophy. No significant foraminal or canal stenosis. L5-S1: Disc bulge with endplate marginal osteophytes eccentric to the left and mild bilateral facet hypertrophy. Moderate left foraminal stenosis. No canal stenosis. IMPRESSION: 1. Stable L2 compression deformity with 7 mm retropulsion superior endplate resulting in moderate canal stenosis. 2. No abnormal enhancement of the visible cord or cauda equina. No new fracture. No epidural metastatic disease. 3. Diffuse decreased T1 signal throughout the visible spine and sacrum compatible with Haley fatty marrow replacement process which may be red marrow, neoplasm, or chemotherapy related response. 4. Stable lumbar spondylosis greatest at L5-S1 level where there is moderate left foraminal stenosis. 5. Multiple hepatic masses and left adrenal mass compatible with metastatic disease. Electronically Signed   By: LKristine GarbeM.D.   On: 10/06/2017 23:47   Mr 3d Recon At Scanner  Result Date: 10/15/2017 CLINICAL DATA:  Metastatic carcinoma of uncertain primary. Abnormal liver function tests and jaundice. EXAM: MRI ABDOMEN WITHOUT AND WITH CONTRAST (INCLUDING MRCP) TECHNIQUE: Multiplanar multisequence MR imaging of the abdomen was performed both before and after the administration of intravenous contrast. Heavily T2-weighted images of the biliary and pancreatic ducts were obtained, and three-dimensional MRCP images were rendered by post processing. CONTRAST:  965mMULTIHANCE GADOBENATE DIMEGLUMINE 529 MG/ML IV SOLN COMPARISON:  PET-CT dated 10/09/2017 and CT abdomen from 05/24/2017 FINDINGS: Lower chest: Airspace opacity and some volume loss in the right lower lobe is again observed with Haley suspected right infrahilar  mass only partially seen today. Borderline cardiomegaly. Small right and trace left pleural effusions. Hepatobiliary: Moderate intrahepatic biliary dilatation with CBD dilatation up to 1.3 cm in diameter, and abrupt truncation of the common bile duct in the vicinity of the pancreatic head  mass. There are approximately 20 hypoenhancing masses scattered in the liver. One lesion in segment 3 of the liver measures 2.7 by 2.0 cm, previously 2.6 by 2.0 cm on 10/09/2017 by my measurement. There is mild gallbladder wall thickening with accentuated precontrast T1 signal in the gallbladder favoring sludge. Pancreas: Hypoenhancing mass in the head of the pancreas measures proximally 3.4 by 3.1 cm on image [id number]. Mildly dilated dorsal pancreatic duct extending to the margin of this mass as shown on image 25/401. Trace peripancreatic edema. Spleen:  Unremarkable.  Trace perisplenic ascites. Adrenals/Urinary Tract: 1.1 by 1.8 cm left adrenal mass was previously hypermetabolic, and probably represents Haley metastatic lesion. The kidneys appear unremarkable. Trace right greater than left perirenal stranding. Stomach/Bowel: No dilated bowel identified. Vascular/Lymphatic:  Aortoiliac atherosclerotic vascular disease. Other:  No supplemental non-categorized findings. Musculoskeletal: Scattered widespread osseous metastatic lesions including the sternum, ribs, spine, and bony pelvis. L2 compression fracture with associated enhancement, and possibly acute component of fracture. IMPRESSION: 1. 3.4 cm pancreatic head mass is obstructing the common bile duct, which measures up to 1.3 cm in diameter. There is also moderate intrahepatic biliary dilatation. The mass also causes truncation of the dorsal pancreatic duct which is mildly dilated. 2. Approximately 20 metastatic lesions are identified in the liver. 3. Right lower lobe airspace opacity and atelectasis. Suspected right infrahilar mass. 4. Small right and trace left pleural  effusions. 5. Small left adrenal mass is suspicious for Haley metastatic lesion. 6. Scattered widespread osseous metastatic disease. 7. Other imaging findings of potential clinical significance: Aortic Atherosclerosis (ICD10-I70.0). L2 compression fracture as shown on prior MRI. Mild upper abdominal ascites with some trace edema along the mesentery and along several organs. Suspected gallbladder sludge with borderline distended gallbladder. Electronically Signed   By: Van Clines M.D.   On: 10/15/2017 09:55   Nm Pet Image Initial (pi) Skull Base To Thigh  Result Date: 10/09/2017 CLINICAL DATA:  Initial treatment strategy for metastatic disease of unknown primary. EXAM: NUCLEAR MEDICINE PET SKULL BASE TO THIGH TECHNIQUE: 6.3 mCi F-18 FDG was injected intravenously. Full-ring PET imaging was performed from the skull base to thigh after the radiotracer. CT data was obtained and used for attenuation correction and anatomic localization. FASTING BLOOD GLUCOSE:  Value: 95 mg/dl COMPARISON:  CT chest 10/05/2017.  Abdomen and pelvis CT 04/23/2017. FINDINGS: NECK: No hypermetabolic lymph nodes in the neck. CHEST: Mediastinal lymphadenopathy seen on recent CT chest is hypermetabolic. The subcarinal lymph node is measures an index with SUV max = 9.1. Mass lesion in the inferior right hilum is hypermetabolic with SUV max = 9.9. Hypermetabolic lymph nodes are seen in the thoracic inlet bilaterally, in the paratracheal space, and in the right hilum. Tiny right pleural effusion noted. Emphysema noted in the lungs bilaterally. ABDOMEN/PELVIS: Multiple hypermetabolic liver metastases are evident. 3.0 cm lesion identified in the subcapsular liver on image 101 demonstrates SUV max = 8.3. Other scattered hypermetabolic liver metastases are evident. 2.4 cm lesion lateral segment left liver on image 118 demonstrates SUV max = 8.8. 3.6 x 3.9 cm soft tissue mass likely in the anterior head of pancreas is hypermetabolic with SUV max  = 8.1. Layering high attenuation in the gallbladder lumen likely vicarious excretion of contrast from recent abdomen and pelvis CT as no gallstones were evident on 05/24/2017. Also possible that this represents sludge or interval development of innumerable tiny mineralized gallstones. Mild intra and extrahepatic biliary duct dilatation is evident. Extrahepatic common duct measures 9 mm diameter and the common  bile duct appears obliterated at the level of the above-described pancreatic head lesion. There is abdominal aortic atherosclerosis without aneurysm. SKELETON: Multiple hypermetabolic bone metastases are evident. Hypermetabolic lesion in the left humeral head demonstrates SUV max = 6.5. Multiple hypermetabolic metastases are seen in the spine. L3 lesion demonstrates SUV max = 8.7. Hypermetabolic lesion in the left acetabulum demonstrates SUV max = 7.5 left femoral neck lesion demonstrates SUV max = 8.5. IMPRESSION: 1. Hypermetabolic lymphadenopathy is identified in the thoracic inlet bilaterally, mediastinum, and right hilum with associated hypermetabolic disease in the right infrahilar region. Additionally, hypermetabolic liver metastases, diffuse bone metastases and Haley 3.9 cm hypermetabolic mass in the head of the pancreas are evident. Imaging findings may well be related to primary pancreatic adenocarcinoma with widespread metastatic involvement. Infrahilar right lung cancer with metastatic disease (including to the pancreas) is also Haley consideration. No pancreatic mass lesion was visible on the CT scan from 05/24/2017, even in retrospect. There is associated mild intra and extrahepatic biliary duct dilatation. EUS and/or abdominal MRI/MRCP may prove helpful to further evaluate. 2.  Aortic Atherosclerois (ICD10-170.0) 3.  Emphysema. (FIE33-I95.9) Electronically Signed   By: Misty Stanley M.D.   On: 10/09/2017 17:22   US Biopsy (liver)  Result Date: 10/17/2017 INDICATION: PANCREAS MASS WITH LIVER METASTASES  EXAM: ULTRASOUND GUIDED CORE BIOPSY OF LEFT HEPATIC MASS MEDICATIONS: 1% lidocaine local ANESTHESIA/SEDATION: Fentanyl 50 mcg IV; Versed 1.0 mg IV Moderate Sedation Time:  10 minutes The patient was continuously monitored during the procedure by the interventional radiology nurse under my direct supervision. PROCEDURE: The procedure, risks, benefits, and alternatives were explained to the patient. Questions regarding the procedure were encouraged and answered. The patient understands and consents to the procedure. Previous imaging reviewed. Preliminary ultrasound performed. Haley left hepatic hypoechoic solid mass was demonstrated in the epigastric region. Overlying skin marked. The epigastric region was prepped with ChloraPrep in Haley sterile fashion, and Haley sterile drape was applied covering the operative field. Haley sterile gown and sterile gloves were used for the procedure. Local anesthesia was provided with 1% Lidocaine. Under sterile conditions and local anesthesia, Haley 17 gauge 6.8 cm access needle was advanced from an epigastric percutaneous approach to the left hepatic mass. Needle position confirmed with ultrasound. 3 18 gauge core biopsies obtained through the lesion. Images obtained for documentation. Samples placed in formalin. Needle tract occlusion performed with Gel-Foam. Postprocedure imaging demonstrates no hemorrhage or hematoma. Patient tolerated the biopsy well. COMPLICATIONS: None immediate. FINDINGS: Imaging confirms needle placed in the left hepatic mass in the lateral segment anteriorly. IMPRESSION: Successful CT-guided left hepatic mass 18 gauge core biopsy Electronically Signed   By: Jerilynn Mages.  Shick M.D.   On: 10/17/2017 13:19   Dg Ercp Biliary & Pancreatic Ducts  Result Date: 10/15/2017 CLINICAL DATA:  Unsuccessful attempted ERCP EXAM: ERCP TECHNIQUE: Multiple spot images obtained with the fluoroscopic device and submitted for interpretation post-procedure. COMPARISON:  MRCP - 10/15/2017 FLUOROSCOPY  TIME:  25 minutes, 45 seconds (175.39 mGy) FINDINGS: There are 4 spot intraoperative fluoroscopic images of the right upper abdominal quadrant provided for review Initial image demonstrates an ERCP probe overlying the right upper abdominal quadrant. There is an additional thin drainage catheter overlying the expected location of the pancreatic duct. Subsequent images demonstrate contrast injection without definitive opacification of Haley recognizable feature of the biliary system. IMPRESSION: 1. Attempted ERCP as above. 2. Additional thin drainage catheter overlies expected location of the pancreatic duct. Above discussed with Dr. Watt Climes prior to completion of the procedure  Electronically Signed   By: Sandi Mariscal M.D.   On: 10/15/2017 14:08   Mr Abdomen Mrcp Moise Boring Contast  Result Date: 10/15/2017 CLINICAL DATA:  Metastatic carcinoma of uncertain primary. Abnormal liver function tests and jaundice. EXAM: MRI ABDOMEN WITHOUT AND WITH CONTRAST (INCLUDING MRCP) TECHNIQUE: Multiplanar multisequence MR imaging of the abdomen was performed both before and after the administration of intravenous contrast. Heavily T2-weighted images of the biliary and pancreatic ducts were obtained, and three-dimensional MRCP images were rendered by post processing. CONTRAST:  51m MULTIHANCE GADOBENATE DIMEGLUMINE 529 MG/ML IV SOLN COMPARISON:  PET-CT dated 10/09/2017 and CT abdomen from 05/24/2017 FINDINGS: Lower chest: Airspace opacity and some volume loss in the right lower lobe is again observed with Haley suspected right infrahilar mass only partially seen today. Borderline cardiomegaly. Small right and trace left pleural effusions. Hepatobiliary: Moderate intrahepatic biliary dilatation with CBD dilatation up to 1.3 cm in diameter, and abrupt truncation of the common bile duct in the vicinity of the pancreatic head mass. There are approximately 20 hypoenhancing masses scattered in the liver. One lesion in segment 3 of the liver measures 2.7  by 2.0 cm, previously 2.6 by 2.0 cm on 10/09/2017 by my measurement. There is mild gallbladder wall thickening with accentuated precontrast T1 signal in the gallbladder favoring sludge. Pancreas: Hypoenhancing mass in the head of the pancreas measures proximally 3.4 by 3.1 cm on image 62/11105. Mildly dilated dorsal pancreatic duct extending to the margin of this mass as shown on image 25/401. Trace peripancreatic edema. Spleen:  Unremarkable.  Trace perisplenic ascites. Adrenals/Urinary Tract: 1.1 by 1.8 cm left adrenal mass was previously hypermetabolic, and probably represents Haley metastatic lesion. The kidneys appear unremarkable. Trace right greater than left perirenal stranding. Stomach/Bowel: No dilated bowel identified. Vascular/Lymphatic:  Aortoiliac atherosclerotic vascular disease. Other:  No supplemental non-categorized findings. Musculoskeletal: Scattered widespread osseous metastatic lesions including the sternum, ribs, spine, and bony pelvis. L2 compression fracture with associated enhancement, and possibly acute component of fracture. IMPRESSION: 1. 3.4 cm pancreatic head mass is obstructing the common bile duct, which measures up to 1.3 cm in diameter. There is also moderate intrahepatic biliary dilatation. The mass also causes truncation of the dorsal pancreatic duct which is mildly dilated. 2. Approximately 20 metastatic lesions are identified in the liver. 3. Right lower lobe airspace opacity and atelectasis. Suspected right infrahilar mass. 4. Small right and trace left pleural effusions. 5. Small left adrenal mass is suspicious for Haley metastatic lesion. 6. Scattered widespread osseous metastatic disease. 7. Other imaging findings of potential clinical significance: Aortic Atherosclerosis (ICD10-I70.0). L2 compression fracture as shown on prior MRI. Mild upper abdominal ascites with some trace edema along the mesentery and along several organs. Suspected gallbladder sludge with borderline distended  gallbladder. Electronically Signed   By: WVan ClinesM.D.   On: 10/15/2017 09:55    MRI Lumbar Spine 09/25/17  IMPRESSION:  1. Extensive marrow signal abnormality throughout the osseous structures of the visualized thoracolumbar spine, sacrum, and iliac bones concerning for metastases, multiple myeloma, or other nonspecific marrow infiltrative disorder. Suggest obtaining Haley PET/CT to evaluate extent of disease/evaluate for Haley primary tumor.  2. L2 compression fracture, concerning for pathologic fracture given the extensive marrow abnormality with approximately 40% vertebral body height loss and 8.652mof retropulsion resulting in moderate narrowing of the spinal canal. Haley postcontrast MRI with fat saturation would help to evaluate the enhancements pattern of the bony disease and evaluate for potential abnormal soft issue extending beyond the cortex which  is not definitively evident in this exam.  3. Moderate facet arthrophy with grade 1 anterolisthesis at L4-5.  Degenerative disc disease with moderate left foraminal stenosis at L5-S1.     ASSESSMENT & PLAN:   Haley Haley is Haley 67 y.o. caucasian female with   1. 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  Liver mets + Bone mets + adrenal mets Pancreatic mets- FNA confirmed MRI brain 10/29/2017 after this clinic visit showed concern for multiple asymptomatic subcentimeter brain mets.  Plan: -Carboplatin/Etoposide/Atezolizumab with neulasta- completed C1 - no prohibitive toxicities from Ctx at this time. -neutropenia - received Neulasta today - did not receive it day 5 as ordered.- -continue Haley Haley -subsequently on 10/29/2017 -- discussed MRI brain results with patient showing multiple asymptomatic subcentimeter brain mets.  -might later need Rad onc input for  consideration of WBRT -Recommended Zofran for nausea first, Compazine or Ativan second.   -Discussed Neulasta shot regimen -Discussed up to 6 cycles of chemotherapy followed by maintenance Atezolizumab.  -Recommended safety precautions: using Haley mask when in public.  -Discussed pt labwork today 11/05/17; liver numbers continuing to improve, WBC should improve after receiving Neulasta shot two days ago, neutropenia with 0.8k.  -Discussed headaches, seizures and symptoms to look out for regarding her brain mets. -Discussed that if her brain mets beocme symptomatic, then we will need to do radiation. -Will repeat scan after 2-3 cycles. -Will maintain current dose of Senna S at night. -Continue medications for thrush. (fluconazole) -Labs once each week to monitor neutropenia. -Discussed that we will discuss with IR about port placement to push back one week, instead of tomorrow, given her Neutro Abs at 0.8k. -Port placement will still be before C2.  -No apparent toxicities outside of thrush and neutropenia.  -Continue boost or ensure and increase PO nutrition.  -Asked pt to go to ED immediately if she develops Haley fever -Asked her to check temperature once Haley day and let us know if temp exceeds 100.4.   -Will see pt again in one week with repeat labs.    2. 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 Harley Hallmark with much improved LFTs -no prohibitive   5. Back pain related to pathologic L2 fracture -continue using back brace -continue prn Oxycodone with GI prophylaxis -will hold of on interventional procedure to L2 at this  time. -Per patient's clinical presentation; her hip and leg pain is likely nerve pinching due to compression fractures.  -conitnue Oxycontin, and keeping short term Oxycodone prn. -Opiate related constipation: advised increasing Senna S to twice Haley day and using Miralax as well; one time OTC Magnesium Citrate if she still cannot move bowels.   5. Smoking history, productive cough  Smoked for 50 years, 1 pack Haley day  Has not had Haley Chest Xray or scan since 2008 She currently has moderate cough with clear phlegm ever since episode of bronchitis. She takes uses albuterol inhaler as needed.  Plan: -Smoking cessation  6. 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 Harley Hallmark  Resolving LFts  7. Moderate to sever protein malnutrition due to weight loss -nutritional consultation  RTC with Dr Irene Limbo with labs in 1 week Reschedule port-Haley-cath placement by 1 week due to neutropenia   All of the patients questions were answered with apparent satisfaction. The patient knows to call the clinic with any problems, questions or concerns.  . The total time spent in the appointment was 25 minutes and more than 50% was on counseling and direct patient cares.   Sullivan Lone MD [place] AAHIVMS [facility] Hematology/Oncology Physician [facility]  (Office):       [phone] (Work cell):  [phone] (Fax):           [phone]  11/05/2017 9:56 AM  This document serves as Haley record of services personally performed by Sullivan Lone, MD. It was created on his behalf by Baldwin [place], Haley trained medical scribe. The creation of this record is based on the scribe's personal observations and the provider's statements to them.   .I have reviewed the above documentation for accuracy and completeness, and I agree with the above. Brunetta Genera MD  MS

## 2017-11-03 NOTE — Progress Notes (Signed)
[facility] Progress Note   BURNETTA KOHLS [id number] 08-Aug-1951 67 y.o.  Carolan Shiver is managed by Dr. Sullivan Lone  Actively treated with chemotherapy: yes  Current Therapy: Carboplatin and etoposide  Last Treated: 10/27/2017 to 10/29/2017 (cycle 1, days 1-3)  Assessment: Plan:    Dehydration - Plan: 0.9 %  sodium chloride infusion  Hyperkalemia - Plan: Basic Metabolic Panel - Cancer Center Only  Oral candidiasis - Plan: fluconazole (DIFLUCAN) 100 MG tablet  Esophagitis - Plan: magic mouthwash w/lidocaine SOLN, gi cocktail (Maalox,Lidocaine,Donnatal)   Dehydration: The patient was given 1 L of normal saline IV.  Hyperkalemia: Patient's chemistry panel returned today with a potassium of 6.1.  This was re-drawn after she received 1 L of IV fluids and had eaten.  The results of her repeat potassium returned at 4.2.  Oral candidiasis: Patient was given a prescription for Diflucan 100 mg once daily times 10 days.  Esophagitis: I discussed with the patient today that her esophagitis was likely related to her oral candidiasis.  She was given a GI cocktail while here with improvement in her esophageal pain.  She was able to eat and drink.  She has been given a prescription for Magic mouthwash with lidocaine which she has been told that she can use up to 4 times daily.  I explained to her that I was hopeful that she would not have to use this more than several days as I hope that her esophageal pain would improve once her oral candidiasis was treated.  Please see After Visit Summary for patient specific instructions.  Future Appointments  Date Time Provider [place]  11/05/2017  9:00 AM [facility] LAB 6 [facility] None  11/05/2017  9:40 AM Brunetta Genera, MD [facility] None  11/06/2017 11:30 AM [facility] ROOM [facility] None  11/06/2017  1:30 PM [facility] 1 [facility] [place]  11/12/2017 10:00 AM [facility]  11/17/2017  9:45 AM  [facility] LAB 2 [facility] None  11/17/2017 10:00 AM [facility] J32 DNS [facility] None  11/17/2017 10:40 AM Brunetta Genera, MD [facility] None  11/17/2017 11:30 AM [facility] G22 [facility] None  11/18/2017  8:45 AM [facility] E15 [facility] None  11/18/2017  9:00 AM Jennet Maduro, RD [facility] None  11/19/2017 10:15 AM [facility] F21 [facility] None  11/21/2017 12:00 PM [facility] FLUSH NURSE 2 [facility] None  12/08/2017  8:15 AM [facility] LAB 2 [facility] None  12/08/2017  8:30 AM [facility] INJ NURSE [facility] None  12/08/2017  9:00 AM Causey, Charlestine Massed, NP [facility] None  12/08/2017  9:45 AM [facility] G22 [facility] None  12/09/2017  8:45 AM [facility] F19 [facility] None  12/10/2017  8:30 AM [facility] B4 [facility] None  12/12/2017 12:00 PM [facility] FLUSH NURSE [facility] None  12/29/2017  8:15 AM [facility] LAB 1 [facility] None  12/29/2017  8:30 AM [facility] INJ NURSE [facility] None  12/29/2017  9:00 AM Brunetta Genera, MD [facility] None  12/29/2017  9:45 AM [facility] A2 [facility] None  12/30/2017  9:15 AM [facility] B5 [facility] None  12/31/2017  9:00 AM [facility] A3 [facility] None  01/02/2018 12:00 PM [facility] FLUSH NURSE [facility] None    Orders Placed This Encounter  Procedures  . Basic Metabolic Panel - Cancer Center Only       Subjective:   Patient ID:  ARINA TORRY is a 67 y.o. (DOB [birth year]) female.  Chief Complaint:  Chief Complaint  Patient presents with  . 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 Sullivan Lone.  She is status post cycle 1, days 1-3 of carboplatin and etoposide which was dosed on 10/27/2017 through 10/29/2017.  She is status post placement of a pancreatic and biliary drain done endoscopically at [facility] on 10/24/2017.  She has had very little p.o.  intake over the last 7 days due to pain with swallowing.  She has no difficulty swallowing but has limited this secondary to the pain that she is experiencing.  She also reports fatigue.  She denies fevers, chills, sweats, nausea, vomiting, constipation, or diarrhea.  Medications: I have reviewed the patient's current medications.  Allergies: No Known Allergies  Past Medical History:  Diagnosis Date  . Allergy   . Osteopenia     Past Surgical History:  Procedure Laterality Date  . COLONOSCOPY    . COLONOSCOPY WITH PROPOFOL N/A 10/25/2014   Procedure: COLONOSCOPY WITH PROPOFOL;  Surgeon: Garlan Fair, MD;  Location[facility];  Service: Endoscopy;  Laterality: N/A;  . ERCP N/A 10/15/2017   Procedure: ENDOSCOPIC RETROGRADE CHOLANGIOPANCREATOGRAPHY (ERCP);  Surgeon: Clarene [facility], MD;  Location: [facility];  Service: Endoscopy;  Laterality: N/A;  . IR RADIOLOGIST EVAL & MGMT  10/02/2017    Family History  Problem Relation Age of Onset  . Diabetes Mother   . Hypertension Mother   . Diabetes Sister   . Hypertension Sister   . Hyperlipidemia Sister     Social History   Socioeconomic History  . Marital status: Single    Spouse name: Not on file  . Number of children: Not on file  . Years of education: Not on file  . Highest education level: Not on file  Social Needs  . Financial resource strain: Not on file  . Food insecurity - worry: Not on file  . Food insecurity - inability: Not on file  . Transportation needs - medical: Not on file  . Transportation needs - non-medical: Not on file  Occupational History  . Not on file  Tobacco Use  . Smoking status: Former Smoker    Packs/day: 1.00    Years: 40.00    Pack years: 40.00    Types: Cigarettes    Last attempt to quit: 09/24/2017    Years since quitting: 0.1  . Smokeless tobacco: Never Used  Substance and Sexual Activity  . Alcohol use: Yes    Comment: rare social  . Drug use: No  . Sexual activity: Not Currently    Other Topics Concern  . Not on file  Social History Narrative  . Not on file    Past Medical History, Surgical history, Social history, and Family history were reviewed and updated as appropriate.   Please see review of systems for further details on the patient's review from today.   Review of Systems:  Review of Systems  Constitutional: Positive for fatigue. Negative for chills, diaphoresis and fever.  HENT: Negative for trouble swallowing.        Pain with swallowing  Respiratory: Negative for cough, choking, chest tightness, shortness of breath and wheezing.   Cardiovascular: Negative for chest pain and palpitations.  Gastrointestinal: Negative for constipation, diarrhea, nausea and vomiting.  Musculoskeletal: Positive for gait problem (Unstable gait).  Neurological: Negative for headaches.    Objective:   Physical Exam:  BP (!) 80/46 (BP Location: Left Arm, Patient Position: Sitting) Comment: Eustaquio Maize is aware  Pulse 77   Temp 97.7 F (36.5 C) (Oral)   Resp 16   Ht 5\' 3"  (1.6 m)   Wt 103 lb 4.8 oz (46.9 kg)   SpO2 97%   BMI 18.30 kg/m  ECOG: 1  Physical Exam  Constitutional:  The patient is a chronically ill-appearing adult female who appears to be in no acute distress.  Her gait is unsteady.  HENT:  Head: Normocephalic and atraumatic.  Multiple areas of whitish plaquing are noted over the tongue.  Eyes: Right eye exhibits no discharge. Left eye exhibits no discharge. No scleral icterus.  Neck: Normal range of motion. Neck supple.  Cardiovascular: Normal rate, regular rhythm and normal heart sounds. Exam reveals no gallop and no friction rub.  No murmur heard. Pulmonary/Chest: Effort normal and breath sounds normal. No respiratory distress. She has no wheezes. She has no rales.  Lymphadenopathy:    She has no cervical adenopathy.  Neurological: She is alert. Coordination (The patient's gait is unsteady.) abnormal.  Skin: Skin is warm and dry. No rash noted. No  erythema.  Psychiatric: She has a normal mood and affect. Her behavior is normal. Judgment and thought content normal.    Lab Review:     Component Value Date/Time   NA 137 11/03/2017 0943   K 6.1 (HH) 11/03/2017 0943   CL 103 11/03/2017 0943   CO2 25 11/03/2017 0943   GLUCOSE 116 11/03/2017 0943   BUN 21 11/03/2017 0943   CREATININE 0.79 11/03/2017 0943   CREATININE 0.80 05/12/2017 1110   CALCIUM 9.3 11/03/2017 0943   PROT 7.2 11/03/2017 0943   ALBUMIN 3.0 (L) 11/03/2017 0943   AST 84 (H) 11/03/2017 0943   ALT 65 (H) 11/03/2017 0943   ALKPHOS 225 (H) 11/03/2017 0943   BILITOT 2.5 (H) 11/03/2017 0943   GFRNONAA >60 11/03/2017 0943   GFRNONAA 81 05/03/2016 0827   GFRAA >60 11/03/2017 0943   GFRAA >89 05/03/2016 0827       Component Value Date/Time   WBC 3.5 (L) 11/03/2017 0943   WBC 8.0 10/27/2017 1002   RBC 4.30 11/03/2017 0943   HGB 12.1 10/27/2017 1002   HCT 37.9 11/03/2017 0943   PLT 227 11/03/2017 0943   MCV 88.2 11/03/2017 0943   MCH 29.6 11/03/2017 0943   MCHC 33.6 11/03/2017 0943   RDW 13.5 11/03/2017 0943   LYMPHSABS 1.2 11/03/2017 0943   MONOABS 0.1 11/03/2017 0943   EOSABS 0.0 11/03/2017 0943   BASOSABS 0.0 11/03/2017 0943   -------------------------------  Imaging from last 24 hours (if applicable):  Radiology interpretation: Dg Chest 2 View  Result Date: 10/05/2017 CLINICAL DATA:  Chest pain and shortness of breath EXAM: CHEST  2 VIEW COMPARISON:  Lumbar MRI September 24, 2017 FINDINGS: There is consolidation in the medial right base. Lungs elsewhere are clear. Heart size and pulmonary vascularity are normal. No adenopathy. No pneumothorax. There is a burst type fracture of the L2 vertebral body, present on recent MR. IMPRESSION: Consolidation medial right base. Lungs elsewhere clear. No adenopathy. Burst type fracture L2 vertebral body, present on prior MR. Electronically Signed   By: Lowella Grip III M.D.   On: 10/05/2017 21:56   Ct Angio Chest  Pe W And/or Wo Contrast  Result Date: 10/05/2017 CLINICAL DATA:  Pt c/o upper R chest pain, difficulty breathing, and cough. She has been experiencing lower back pain over the last few weeks and was dx'd with a crushed vertebrae and possibly lung cancer. EXAM: CT ANGIOGRAPHY  CHEST WITH CONTRAST TECHNIQUE: Multidetector CT imaging of the chest was performed using the standard protocol during bolus administration of intravenous contrast. Multiplanar CT image reconstructions and MIPs were obtained to evaluate the vascular anatomy. CONTRAST:  <See Chart> ISOVUE-370 IOPAMIDOL (ISOVUE-370) INJECTION 76% COMPARISON:  Current chest radiographs FINDINGS: Cardiovascular: Satisfactory opacification of the pulmonary arteries to the segmental level. No evidence of pulmonary embolism. Normal heart size. No pericardial effusion. There are mild, 3 vessel coronary artery calcifications. Aorta is normal in caliber. No dissection. Mild atherosclerotic disease noted along the arch and descending thoracic aorta. Mediastinum/Nodes: No neck base or axillary masses or adenopathy. Visualized thyroid is in appearance. Borderline enlarged right paratracheal, azygos level lymph node measuring 11 mm short axis. There is abnormal soft tissue that extends along the inferior right hilum and subcarinal mediastinum to surround the bronchus intermedius in occludes the more distal bronchus intermedius and right middle lobe bronchus. Lungs/Pleura: Complete atelectasis of the right lower lobe. With near complete atelectasis of the right middle lobe. Moderate to advanced centrilobular emphysema. No discrete lung masses or nodules. No evidence of pneumonia or pulmonary edema. Small right pleural effusion. No pneumothorax. Upper Abdomen: Low-density liver lesions, 3 total seen, along the right lobe, measuring from 16 mm to 20 mm. Left adrenal mass measuring 16 mm. No acute findings in the upper abdomen. Musculoskeletal: No fracture or acute finding. No  osteoblastic or osteolytic lesions. Review of the MIP images confirms the above findings. IMPRESSION: 1. No evidence of a pulmonary embolism. 2. Abnormal soft tissue, consistent with malignancy, surrounds the inferior right hilar structures and occludes the bronchus intermedius leading to complete atelectasis of the right lower lobe and significant atelectasis of the right middle lobe. There is a single borderline enlarged right paratracheal lymph node measuring 11 mm in short axis. 3. Three liver lesions are seen suspicious for metastatic disease. Enlarged left adrenal gland is also suspicious for metastatic disease. 4. No convincing pneumonia or evidence of pulmonary edema. 5. Moderate to advanced emphysema. Aortic Atherosclerosis (ICD10-I70.0) and Emphysema (ICD10-J43.9). Electronically Signed   By: Lajean Manes M.D.   On: 10/05/2017 23:01   Mr Jeri Cos IO Contrast  Result Date: 10/29/2017 CLINICAL DATA:  Staging of small cell lung cancer. EXAM: MRI HEAD WITHOUT AND WITH CONTRAST TECHNIQUE: Multiplanar, multiecho pulse sequences of the brain and surrounding structures were obtained without and with intravenous contrast. CONTRAST:  25mL MULTIHANCE GADOBENATE DIMEGLUMINE 529 MG/ML IV SOLN COMPARISON:  None. FINDINGS: Brain: Positive for metastatic disease. The patient's brain metastases are all subcentimeter and show no significant vasogenic edema. The metastases are best seen on diffusion imaging as they are subtly enhancing. Post-contrast visualization is best on the sagittal acquisition, which is also the latest. At least 12 brain metastases are seen in labeled on series 77. Many these are visible on series 15. Two punctate enhancing lesions seen on series 15 are not clearly seen on diffusion imaging, located right frontal on 6:15 and left periventricular on 14:15. This gives a total of 14 visualized brain metastases. No evidence of acute infarct, hemorrhage, hydrocephalus, or collection. Vascular: Major flow  voids are preserved. Skull and upper cervical spine: Infiltrative marrow lesions seen in the clivus, C2 body, C2 left lateral mass, C3 left articular process, and C4 body and left articular process. Calvarial metastases are also noted. Sinuses/Orbits: Remote blowout fracture of the medial wall left orbit. No evidence of orbital metastatic disease. Other: These results will be called to the ordering clinician or representative by the  Psychologist, clinical, and communication documented in the PACS or zVision Dashboard. IMPRESSION: 1. Positive for multiple subcentimeter brain metastases as recorded above. No associated mass effect or edema. 2. Multifocal osseous metastatic disease. Electronically Signed   By: Monte Fantasia M.D.   On: 10/29/2017 11:26   Mr Lumbar Spine W Wo Contrast  Result Date: 10/06/2017 CLINICAL DATA:  67 y/o F; bone metastasis with pathologic fracture of L2 vertebral body. Numbness and tingling beginning to radiate down her legs. EXAM: MRI LUMBAR SPINE WITHOUT AND WITH CONTRAST TECHNIQUE: Multiplanar and multiecho pulse sequences of the lumbar spine were obtained without and with intravenous contrast. CONTRAST:  15mL MULTIHANCE GADOBENATE DIMEGLUMINE 529 MG/ML IV SOLN COMPARISON:  09/24/2017 MRI of the lumbar spine. 10/05/2017 CT of the chest. FINDINGS: Segmentation:  Standard. Alignment:  Physiologic. Vertebrae: Diffuse low T1 signal with mild enhancement throughout the visible lumbar spine and sacrum patchy areas of fatty marrow sparing. Stable moderate compression deformity of L2 vertebral body with 7 mm retropulsion of superior endplate. Stable mild irregular enhancement of the L2 vertebral body. No new compression deformity. No epidural disease. Conus medullaris and cauda equina: Conus extends to the L1 level. Conus and cauda equina appear normal. Paraspinal and other soft tissues: Numerous masses throughout the liver measuring up to 23 mm in segment 2 and ill-defined nodule in left  adrenal gland, likely metastatic disease. Disc levels: L1-2: Retropulsion of L2 superior endplate results in stable moderate canal stenosis. No foraminal stenosis. L2-3: No significant disc displacement, foraminal stenosis, or canal stenosis. L3-4: Small disc bulge with mild facet and ligamentum flavum hypertrophy. No significant foraminal or canal stenosis. L4-5: Small disc bulge with moderate facet hypertrophy. No significant foraminal or canal stenosis. L5-S1: Disc bulge with endplate marginal osteophytes eccentric to the left and mild bilateral facet hypertrophy. Moderate left foraminal stenosis. No canal stenosis. IMPRESSION: 1. Stable L2 compression deformity with 7 mm retropulsion superior endplate resulting in moderate canal stenosis. 2. No abnormal enhancement of the visible cord or cauda equina. No new fracture. No epidural metastatic disease. 3. Diffuse decreased T1 signal throughout the visible spine and sacrum compatible with a fatty marrow replacement process which may be red marrow, neoplasm, or chemotherapy related response. 4. Stable lumbar spondylosis greatest at L5-S1 level where there is moderate left foraminal stenosis. 5. Multiple hepatic masses and left adrenal mass compatible with metastatic disease. Electronically Signed   By: Kristine Garbe M.D.   On: 10/06/2017 23:47   Mr 3d Recon At Scanner  Result Date: 10/15/2017 CLINICAL DATA:  Metastatic carcinoma of uncertain primary. Abnormal liver function tests and jaundice. EXAM: MRI ABDOMEN WITHOUT AND WITH CONTRAST (INCLUDING MRCP) TECHNIQUE: Multiplanar multisequence MR imaging of the abdomen was performed both before and after the administration of intravenous contrast. Heavily T2-weighted images of the biliary and pancreatic ducts were obtained, and three-dimensional MRCP images were rendered by post processing. CONTRAST:  62mL MULTIHANCE GADOBENATE DIMEGLUMINE 529 MG/ML IV SOLN COMPARISON:  PET-CT dated 10/09/2017 and CT  abdomen from 05/24/2017 FINDINGS: Lower chest: Airspace opacity and some volume loss in the right lower lobe is again observed with a suspected right infrahilar mass only partially seen today. Borderline cardiomegaly. Small right and trace left pleural effusions. Hepatobiliary: Moderate intrahepatic biliary dilatation with CBD dilatation up to 1.3 cm in diameter, and abrupt truncation of the common bile duct in the vicinity of the pancreatic head mass. There are approximately 20 hypoenhancing masses scattered in the liver. One lesion in segment 3 of the liver measures  2.7 by 2.0 cm, previously 2.6 by 2.0 cm on 10/09/2017 by my measurement. There is mild gallbladder wall thickening with accentuated precontrast T1 signal in the gallbladder favoring sludge. Pancreas: Hypoenhancing mass in the head of the pancreas measures proximally 3.4 by 3.1 cm on image [id number]. Mildly dilated dorsal pancreatic duct extending to the margin of this mass as shown on image 25/401. Trace peripancreatic edema. Spleen:  Unremarkable.  Trace perisplenic ascites. Adrenals/Urinary Tract: 1.1 by 1.8 cm left adrenal mass was previously hypermetabolic, and probably represents a metastatic lesion. The kidneys appear unremarkable. Trace right greater than left perirenal stranding. Stomach/Bowel: No dilated bowel identified. Vascular/Lymphatic:  Aortoiliac atherosclerotic vascular disease. Other:  No supplemental non-categorized findings. Musculoskeletal: Scattered widespread osseous metastatic lesions including the sternum, ribs, spine, and bony pelvis. L2 compression fracture with associated enhancement, and possibly acute component of fracture. IMPRESSION: 1. 3.4 cm pancreatic head mass is obstructing the common bile duct, which measures up to 1.3 cm in diameter. There is also moderate intrahepatic biliary dilatation. The mass also causes truncation of the dorsal pancreatic duct which is mildly dilated. 2. Approximately 20 metastatic lesions  are identified in the liver. 3. Right lower lobe airspace opacity and atelectasis. Suspected right infrahilar mass. 4. Small right and trace left pleural effusions. 5. Small left adrenal mass is suspicious for a metastatic lesion. 6. Scattered widespread osseous metastatic disease. 7. Other imaging findings of potential clinical significance: Aortic Atherosclerosis (ICD10-I70.0). L2 compression fracture as shown on prior MRI. Mild upper abdominal ascites with some trace edema along the mesentery and along several organs. Suspected gallbladder sludge with borderline distended gallbladder. Electronically Signed   By: Van Clines M.D.   On: 10/15/2017 09:55   Nm Pet Image Initial (pi) Skull Base To Thigh  Result Date: 10/09/2017 CLINICAL DATA:  Initial treatment strategy for metastatic disease of unknown primary. EXAM: NUCLEAR MEDICINE PET SKULL BASE TO THIGH TECHNIQUE: 6.3 mCi F-18 FDG was injected intravenously. Full-ring PET imaging was performed from the skull base to thigh after the radiotracer. CT data was obtained and used for attenuation correction and anatomic localization. FASTING BLOOD GLUCOSE:  Value: 95 mg/dl COMPARISON:  CT chest 10/05/2017.  Abdomen and pelvis CT 04/23/2017. FINDINGS: NECK: No hypermetabolic lymph nodes in the neck. CHEST: Mediastinal lymphadenopathy seen on recent CT chest is hypermetabolic. The subcarinal lymph node is measures an index with SUV max = 9.1. Mass lesion in the inferior right hilum is hypermetabolic with SUV max = 9.9. Hypermetabolic lymph nodes are seen in the thoracic inlet bilaterally, in the paratracheal space, and in the right hilum. Tiny right pleural effusion noted. Emphysema noted in the lungs bilaterally. ABDOMEN/PELVIS: Multiple hypermetabolic liver metastases are evident. 3.0 cm lesion identified in the subcapsular liver on image 101 demonstrates SUV max = 8.3. Other scattered hypermetabolic liver metastases are evident. 2.4 cm lesion lateral segment  left liver on image 118 demonstrates SUV max = 8.8. 3.6 x 3.9 cm soft tissue mass likely in the anterior head of pancreas is hypermetabolic with SUV max = 8.1. Layering high attenuation in the gallbladder lumen likely vicarious excretion of contrast from recent abdomen and pelvis CT as no gallstones were evident on 05/24/2017. Also possible that this represents sludge or interval development of innumerable tiny mineralized gallstones. Mild intra and extrahepatic biliary duct dilatation is evident. Extrahepatic common duct measures 9 mm diameter and the common bile duct appears obliterated at the level of the above-described pancreatic head lesion. There is abdominal aortic atherosclerosis without aneurysm.  SKELETON: Multiple hypermetabolic bone metastases are evident. Hypermetabolic lesion in the left humeral head demonstrates SUV max = 6.5. Multiple hypermetabolic metastases are seen in the spine. L3 lesion demonstrates SUV max = 8.7. Hypermetabolic lesion in the left acetabulum demonstrates SUV max = 7.5 left femoral neck lesion demonstrates SUV max = 8.5. IMPRESSION: 1. Hypermetabolic lymphadenopathy is identified in the thoracic inlet bilaterally, mediastinum, and right hilum with associated hypermetabolic disease in the right infrahilar region. Additionally, hypermetabolic liver metastases, diffuse bone metastases and a 3.9 cm hypermetabolic mass in the head of the pancreas are evident. Imaging findings may well be related to primary pancreatic adenocarcinoma with widespread metastatic involvement. Infrahilar right lung cancer with metastatic disease (including to the pancreas) is also a consideration. No pancreatic mass lesion was visible on the CT scan from 05/24/2017, even in retrospect. There is associated mild intra and extrahepatic biliary duct dilatation. EUS and/or abdominal MRI/MRCP may prove helpful to further evaluate. 2.  Aortic Atherosclerois (ICD10-170.0) 3.  Emphysema. ([id number])  Electronically Signed   By: Misty Stanley M.D.   On: 10/09/2017 17:22   US Biopsy (liver)  Result Date: 10/17/2017 INDICATION: PANCREAS MASS WITH LIVER METASTASES EXAM: ULTRASOUND GUIDED CORE BIOPSY OF LEFT HEPATIC MASS MEDICATIONS: 1% lidocaine local ANESTHESIA/SEDATION: Fentanyl 50 mcg IV; Versed 1.0 mg IV Moderate Sedation Time:  10 minutes The patient was continuously monitored during the procedure by the interventional radiology nurse under my direct supervision. PROCEDURE: The procedure, risks, benefits, and alternatives were explained to the patient. Questions regarding the procedure were encouraged and answered. The patient understands and consents to the procedure. Previous imaging reviewed. Preliminary ultrasound performed. A left hepatic hypoechoic solid mass was demonstrated in the epigastric region. Overlying skin marked. The epigastric region was prepped with ChloraPrep in a sterile fashion, and a sterile drape was applied covering the operative field. A sterile gown and sterile gloves were used for the procedure. Local anesthesia was provided with 1% Lidocaine. Under sterile conditions and local anesthesia, a 17 gauge 6.8 cm access needle was advanced from an epigastric percutaneous approach to the left hepatic mass. Needle position confirmed with ultrasound. 3 18 gauge core biopsies obtained through the lesion. Images obtained for documentation. Samples placed in formalin. Needle tract occlusion performed with Gel-Foam. Postprocedure imaging demonstrates no hemorrhage or hematoma. Patient tolerated the biopsy well. COMPLICATIONS: None immediate. FINDINGS: Imaging confirms needle placed in the left hepatic mass in the lateral segment anteriorly. IMPRESSION: Successful CT-guided left hepatic mass 18 gauge core biopsy Electronically Signed   By: Jerilynn Mages.  Shick M.D.   On: 10/17/2017 13:19   Dg Ercp Biliary & Pancreatic Ducts  Result Date: 10/15/2017 CLINICAL DATA:  Unsuccessful attempted ERCP EXAM:  ERCP TECHNIQUE: Multiple spot images obtained with the fluoroscopic device and submitted for interpretation post-procedure. COMPARISON:  MRCP - 10/15/2017 FLUOROSCOPY TIME:  25 minutes, 45 seconds (175.39 mGy) FINDINGS: There are 4 spot intraoperative fluoroscopic images of the right upper abdominal quadrant provided for review Initial image demonstrates an ERCP probe overlying the right upper abdominal quadrant. There is an additional thin drainage catheter overlying the expected location of the pancreatic duct. Subsequent images demonstrate contrast injection without definitive opacification of a recognizable feature of the biliary system. IMPRESSION: 1. Attempted ERCP as above. 2. Additional thin drainage catheter overlies expected location of the pancreatic duct. Above discussed with Dr. Watt Climes prior to completion of the procedure Electronically Signed   By: Sandi Mariscal M.D.   On: 10/15/2017 14:08   Mr Abdomen Mrcp W  Wo Contast  Result Date: 10/15/2017 CLINICAL DATA:  Metastatic carcinoma of uncertain primary. Abnormal liver function tests and jaundice. EXAM: MRI ABDOMEN WITHOUT AND WITH CONTRAST (INCLUDING MRCP) TECHNIQUE: Multiplanar multisequence MR imaging of the abdomen was performed both before and after the administration of intravenous contrast. Heavily T2-weighted images of the biliary and pancreatic ducts were obtained, and three-dimensional MRCP images were rendered by post processing. CONTRAST:  47mL MULTIHANCE GADOBENATE DIMEGLUMINE 529 MG/ML IV SOLN COMPARISON:  PET-CT dated 10/09/2017 and CT abdomen from 05/24/2017 FINDINGS: Lower chest: Airspace opacity and some volume loss in the right lower lobe is again observed with a suspected right infrahilar mass only partially seen today. Borderline cardiomegaly. Small right and trace left pleural effusions. Hepatobiliary: Moderate intrahepatic biliary dilatation with CBD dilatation up to 1.3 cm in diameter, and abrupt truncation of the common bile duct  in the vicinity of the pancreatic head mass. There are approximately 20 hypoenhancing masses scattered in the liver. One lesion in segment 3 of the liver measures 2.7 by 2.0 cm, previously 2.6 by 2.0 cm on 10/09/2017 by my measurement. There is mild gallbladder wall thickening with accentuated precontrast T1 signal in the gallbladder favoring sludge. Pancreas: Hypoenhancing mass in the head of the pancreas measures proximally 3.4 by 3.1 cm on image [id number]. Mildly dilated dorsal pancreatic duct extending to the margin of this mass as shown on image 25/401. Trace peripancreatic edema. Spleen:  Unremarkable.  Trace perisplenic ascites. Adrenals/Urinary Tract: 1.1 by 1.8 cm left adrenal mass was previously hypermetabolic, and probably represents a metastatic lesion. The kidneys appear unremarkable. Trace right greater than left perirenal stranding. Stomach/Bowel: No dilated bowel identified. Vascular/Lymphatic:  Aortoiliac atherosclerotic vascular disease. Other:  No supplemental non-categorized findings. Musculoskeletal: Scattered widespread osseous metastatic lesions including the sternum, ribs, spine, and bony pelvis. L2 compression fracture with associated enhancement, and possibly acute component of fracture. IMPRESSION: 1. 3.4 cm pancreatic head mass is obstructing the common bile duct, which measures up to 1.3 cm in diameter. There is also moderate intrahepatic biliary dilatation. The mass also causes truncation of the dorsal pancreatic duct which is mildly dilated. 2. Approximately 20 metastatic lesions are identified in the liver. 3. Right lower lobe airspace opacity and atelectasis. Suspected right infrahilar mass. 4. Small right and trace left pleural effusions. 5. Small left adrenal mass is suspicious for a metastatic lesion. 6. Scattered widespread osseous metastatic disease. 7. Other imaging findings of potential clinical significance: Aortic Atherosclerosis (ICD10-I70.0). L2 compression fracture as  shown on prior MRI. Mild upper abdominal ascites with some trace edema along the mesentery and along several organs. Suspected gallbladder sludge with borderline distended gallbladder. Electronically Signed   By: Van Clines M.D.   On: 10/15/2017 09:55

## 2017-11-03 NOTE — Progress Notes (Signed)
Pt swallowing better and with less pain after GI cocktail. Given soup and sandwich and tolerating fairly well in small amounts.  Pt feeling much better after IVF completed. Was able to eat most of soup, sherbet and small bites of sandwich.  Repeat K+ is 4.2. Ok to discharge home per Sandi Mealy, PA  [facility]

## 2017-11-03 NOTE — Patient Instructions (Signed)
Oral Thrush, Adult Oral thrush is an infection in your mouth and throat. It causes white patches on your tongue and in your mouth. Follow these instructions at home: Helping with soreness  To lessen your pain: ? Drink cold liquids, like water and iced tea. ? Eat frozen ice pops or frozen juices. ? Eat foods that are easy to swallow, like gelatin and ice cream. ? Drink from a straw if the patches in your mouth are painful. General instructions   Take or use over-the-counter and prescription medicines only as told by your doctor. Medicine for oral thrush may be something to swallow, or it may be something to put on the infected area.  Eat plain yogurt that has live cultures in it. Read the label to make sure.  If you wear dentures: ? Take out your dentures before you go to bed. ? Brush them well. ? Soak them in a denture cleaner.  Rinse your mouth with warm salt-water many times a day. To make the salt-water mixture, completely dissolve 1/2-1 teaspoon of salt in 1 cup of warm water. Contact a doctor if:  Your problems are getting worse.  Your problems do not get better in less than 7 days with treatment.  Your infection is spreading. This may show as white patches on the skin outside of your mouth.  You are nursing your baby and you have redness and pain in the nipples. This information is not intended to replace advice given to you by your health care provider. Make sure you discuss any questions you have with your health care provider. Document Released: 11/06/2009 Document Revised: 05/06/2016 Document Reviewed: 05/06/2016 Elsevier Interactive Patient Education  2017 [employer] Dehydration, Adult Dehydration is a condition in which there is not enough fluid or water in the body. This happens when you lose more fluids than you take in. Important organs, such as the kidneys, brain, and heart, cannot function without a proper amount of fluids. Any loss of fluids from the body can  lead to dehydration. Dehydration can range from mild to severe. This condition should be treated right away to prevent it from becoming severe. What are the causes? This condition may be caused by:  Vomiting.  Diarrhea.  Excessive sweating, such as from heat exposure or exercise.  Not drinking enough fluid, especially: ? When ill. ? While doing activity that requires a lot of energy.  Excessive urination.  Fever.  Infection.  Certain medicines, such as medicines that cause the body to lose excess fluid (diuretics).  Inability to access safe drinking water.  Reduced physical ability to get adequate water and food.  What increases the risk? This condition is more likely to develop in people:  Who have a poorly controlled long-term (chronic) illness, such as diabetes, heart disease, or kidney disease.  Who are age 34 or older.  Who are disabled.  Who live in a place with high altitude.  Who play endurance sports.  What are the signs or symptoms? Symptoms of mild dehydration may include:  Thirst.  Dry lips.  Slightly dry mouth.  Dry, warm skin.  Dizziness. Symptoms of moderate dehydration may include:  Very dry mouth.  Muscle cramps.  Dark urine. Urine may be the color of tea.  Decreased urine production.  Decreased tear production.  Heartbeat that is irregular or faster than normal (palpitations).  Headache.  Light-headedness, especially when you stand up from a sitting position.  Fainting (syncope). Symptoms of severe dehydration may include:  Changes in  skin, such as: ? Cold and clammy skin. ? Blotchy (mottled) or pale skin. ? Skin that does not quickly return to normal after being lightly pinched and released (poor skin turgor).  Changes in body fluids, such as: ? Extreme thirst. ? No tear production. ? Inability to sweat when body temperature is high, such as in hot weather. ? Very little urine production.  Changes in vital signs,  such as: ? Weak pulse. ? Pulse that is more than 100 beats a minute when sitting still. ? Rapid breathing. ? Low blood pressure.  Other changes, such as: ? Sunken eyes. ? Cold hands and feet. ? Confusion. ? Lack of energy (lethargy). ? Difficulty waking up from sleep. ? Short-term weight loss. ? Unconsciousness. How is this diagnosed? This condition is diagnosed based on your symptoms and a physical exam. Blood and urine tests may be done to help confirm the diagnosis. How is this treated? Treatment for this condition depends on the severity. Mild or moderate dehydration can often be treated at home. Treatment should be started right away. Do not wait until dehydration becomes severe. Severe dehydration is an emergency and it needs to be treated in a hospital. Treatment for mild dehydration may include:  Drinking more fluids.  Replacing salts and minerals in your blood (electrolytes) that you may have lost. Treatment for moderate dehydration may include:  Drinking an oral rehydration solution (ORS). This is a drink that helps you replace fluids and electrolytes (rehydrate). It can be found at pharmacies and retail stores. Treatment for severe dehydration may include:  Receiving fluids through an IV tube.  Receiving an electrolyte solution through a feeding tube that is passed through your nose and into your stomach (nasogastric tube, or NG tube).  Correcting any abnormalities in electrolytes.  Treating the underlying cause of dehydration. Follow these instructions at home:  If directed by your health care provider, drink an ORS: ? Make an ORS by following instructions on the package. ? Start by drinking small amounts, about  cup (120 mL) every 5-10 minutes. ? Slowly increase how much you drink until you have taken the amount recommended by your health care provider.  Drink enough clear fluid to keep your urine clear or pale yellow. If you were told to drink an ORS, finish  the ORS first, then start slowly drinking other clear fluids. Drink fluids such as: ? Water. Do not drink only water. Doing that can lead to having too little salt (sodium) in the body (hyponatremia). ? Ice chips. ? Fruit juice that you have added water to (diluted fruit juice). ? Low-calorie sports drinks.  Avoid: ? Alcohol. ? Drinks that contain a lot of sugar. These include high-calorie sports drinks, fruit juice that is not diluted, and soda. ? Caffeine. ? Foods that are greasy or contain a lot of fat or sugar.  Take over-the-counter and prescription medicines only as told by your health care provider.  Do not take sodium tablets. This can lead to having too much sodium in the body (hypernatremia).  Eat foods that contain a healthy balance of electrolytes, such as bananas, oranges, potatoes, tomatoes, and spinach.  Keep all follow-up visits as told by your health care provider. This is important. Contact a health care provider if:  You have abdominal pain that: ? Gets worse. ? Stays in one area (localizes).  You have a rash.  You have a stiff neck.  You are more irritable than usual.  You are sleepier or more difficult  to wake up than usual.  You feel weak or dizzy.  You feel very thirsty.  You have urinated only a small amount of very dark urine over 6-8 hours. Get help right away if:  You have symptoms of severe dehydration.  You cannot drink fluids without vomiting.  Your symptoms get worse with treatment.  You have a fever.  You have a severe headache.  You have vomiting or diarrhea that: ? Gets worse. ? Does not go away.  You have blood or green matter (bile) in your vomit.  You have blood in your stool. This may cause stool to look black and tarry.  You have not urinated in 6-8 hours.  You faint.  Your heart rate while sitting still is over 100 beats a minute.  You have trouble breathing. This information is not intended to replace advice  given to you by your health care provider. Make sure you discuss any questions you have with your health care provider. Document Released: 08/12/2005 Document Revised: 03/08/2016 Document Reviewed: 10/06/2015 Elsevier Interactive Patient Education  [employer].

## 2017-11-05 ENCOUNTER — Inpatient Hospital Stay: Payer: No Typology Code available for payment source

## 2017-11-05 ENCOUNTER — Encounter: Payer: Self-pay | Admitting: *Deleted

## 2017-11-05 ENCOUNTER — Inpatient Hospital Stay (HOSPITAL_BASED_OUTPATIENT_CLINIC_OR_DEPARTMENT_OTHER): Payer: No Typology Code available for payment source | Admitting: Hematology

## 2017-11-05 ENCOUNTER — Telehealth: Payer: Self-pay | Admitting: Hematology

## 2017-11-05 ENCOUNTER — Encounter: Payer: Self-pay | Admitting: Hematology

## 2017-11-05 VITALS — BP 95/58 | HR 80 | Temp 97.7°F | Resp 20 | Ht 63.0 in | Wt 102.4 lb

## 2017-11-05 DIAGNOSIS — Z5112 Encounter for antineoplastic immunotherapy: Secondary | ICD-10-CM | POA: Diagnosis not present

## 2017-11-05 DIAGNOSIS — C801 Malignant (primary) neoplasm, unspecified: Secondary | ICD-10-CM

## 2017-11-05 DIAGNOSIS — C7889 Secondary malignant neoplasm of other digestive organs: Secondary | ICD-10-CM | POA: Diagnosis not present

## 2017-11-05 DIAGNOSIS — Z7189 Other specified counseling: Secondary | ICD-10-CM

## 2017-11-05 DIAGNOSIS — C7951 Secondary malignant neoplasm of bone: Secondary | ICD-10-CM

## 2017-11-05 DIAGNOSIS — C7970 Secondary malignant neoplasm of unspecified adrenal gland: Secondary | ICD-10-CM

## 2017-11-05 DIAGNOSIS — C787 Secondary malignant neoplasm of liver and intrahepatic bile duct: Secondary | ICD-10-CM

## 2017-11-05 DIAGNOSIS — K831 Obstruction of bile duct: Secondary | ICD-10-CM

## 2017-11-05 DIAGNOSIS — E43 Unspecified severe protein-calorie malnutrition: Secondary | ICD-10-CM

## 2017-11-05 DIAGNOSIS — F17211 Nicotine dependence, cigarettes, in remission: Secondary | ICD-10-CM

## 2017-11-05 DIAGNOSIS — C3490 Malignant neoplasm of unspecified part of unspecified bronchus or lung: Secondary | ICD-10-CM

## 2017-11-05 DIAGNOSIS — D702 Other drug-induced agranulocytosis: Secondary | ICD-10-CM

## 2017-11-05 DIAGNOSIS — E871 Hypo-osmolality and hyponatremia: Secondary | ICD-10-CM

## 2017-11-05 DIAGNOSIS — R05 Cough: Secondary | ICD-10-CM

## 2017-11-05 DIAGNOSIS — G893 Neoplasm related pain (acute) (chronic): Secondary | ICD-10-CM

## 2017-11-05 DIAGNOSIS — C7931 Secondary malignant neoplasm of brain: Secondary | ICD-10-CM | POA: Diagnosis not present

## 2017-11-05 LAB — CBC WITH DIFFERENTIAL/PLATELET
BASOS ABS: 0 10*3/uL (ref 0.0–0.1)
Basophils Relative: 2 %
Eosinophils Absolute: 0 10*3/uL (ref 0.0–0.5)
Eosinophils Relative: 0 %
HEMATOCRIT: 33.2 % — AB (ref 34.8–46.6)
Hemoglobin: 10.9 g/dL — ABNORMAL LOW (ref 11.6–15.9)
LYMPHS PCT: 54 %
Lymphs Abs: 1.3 10*3/uL (ref 0.9–3.3)
MCH: 29.3 pg (ref 25.1–34.0)
MCHC: 32.8 g/dL (ref 31.5–36.0)
MCV: 89.2 fL (ref 79.5–101.0)
Monocytes Absolute: 0.2 10*3/uL (ref 0.1–0.9)
Monocytes Relative: 9 %
NEUTROS ABS: 0.8 10*3/uL — AB (ref 1.5–6.5)
Neutrophils Relative %: 35 %
Platelets: 130 10*3/uL — ABNORMAL LOW (ref 145–400)
RBC: 3.72 MIL/uL (ref 3.70–5.45)
RDW: 13.4 % (ref 11.2–14.5)
WBC: 2.4 10*3/uL — AB (ref 3.9–10.3)

## 2017-11-05 LAB — CMP (CANCER CENTER ONLY)
ALBUMIN: 2.9 g/dL — AB (ref 3.5–5.0)
ALT: 55 U/L (ref 0–55)
ANION GAP: 7 (ref 3–11)
AST: 81 U/L — ABNORMAL HIGH (ref 5–34)
Alkaline Phosphatase: 205 U/L — ABNORMAL HIGH (ref 40–150)
BILIRUBIN TOTAL: 2.2 mg/dL — AB (ref 0.2–1.2)
BUN: 20 mg/dL (ref 7–26)
CHLORIDE: 106 mmol/L (ref 98–109)
CO2: 26 mmol/L (ref 22–29)
Calcium: 9.6 mg/dL (ref 8.4–10.4)
Creatinine: 0.75 mg/dL (ref 0.60–1.10)
GFR, Est AFR Am: >60 mL/min (ref >60)
GFR, Estimated: >60 mL/min (ref >60)
GLUCOSE: 122 mg/dL (ref 70–140)
POTASSIUM: 5.1 mmol/L (ref 3.5–5.1)
Sodium: 139 mmol/L (ref 136–145)
TOTAL PROTEIN: 6.8 g/dL (ref 6.4–8.3)

## 2017-11-05 LAB — RETICULOCYTES
RBC.: 3.72 MIL/uL (ref 3.70–5.45)
RETIC COUNT ABSOLUTE: 22.3 10*3/uL — AB (ref 33.7–90.7)
Retic Ct Pct: 0.6 % — ABNORMAL LOW (ref 0.7–2.1)

## 2017-11-05 MED ORDER — PEGFILGRASTIM-CBQV 6 MG/0.6ML ~~LOC~~ SOSY
PREFILLED_SYRINGE | SUBCUTANEOUS | Status: AC
Start: 2017-11-05 — End: 2017-11-05
  Filled 2017-11-05: qty 0.6

## 2017-11-05 MED ORDER — PEGFILGRASTIM-CBQV 6 MG/0.6ML ~~LOC~~ SOSY
6.0000 mg | PREFILLED_SYRINGE | Freq: Once | SUBCUTANEOUS | Status: AC
  Administered 2017-11-05: 6 mg via SUBCUTANEOUS

## 2017-11-05 NOTE — Progress Notes (Signed)
Oncology Nurse Navigator Documentation  Oncology Nurse Navigator Flowsheets 11/05/2017  Navigator Location [facility]  Navigator Encounter Type Clinic/MDC/I spoke to patient and family today at [facility].  She in newly DX lung cancer. I gave and explained information on lung cancer and treatments.  I gave her my information to call if needed.   Abnormal Finding Date 10/05/2017  Confirmed Diagnosis Date 10/17/2017  Treatment Initiated Date 10/27/2017  Patient Visit Type MedOnc  Treatment Phase Treatment  Barriers/Navigation Needs Education  Education Newly Diagnosed Cancer Education;Understanding Cancer/ Treatment Options  Interventions Education  Education Method Verbal;Written  Acuity Level 1  Acuity Level 1 Initial guidance, education and coordination as needed  Time Spent with Patient 15

## 2017-11-05 NOTE — Telephone Encounter (Signed)
Scheduled appt per 3/13 los - Gave patient AVS and calender per los.

## 2017-11-06 ENCOUNTER — Other Ambulatory Visit (HOSPITAL_COMMUNITY): Payer: 59

## 2017-11-06 ENCOUNTER — Ambulatory Visit (HOSPITAL_COMMUNITY): Payer: 59

## 2017-11-09 ENCOUNTER — Other Ambulatory Visit: Payer: Self-pay | Admitting: Radiology

## 2017-11-10 ENCOUNTER — Other Ambulatory Visit: Payer: Self-pay | Admitting: Hematology

## 2017-11-10 ENCOUNTER — Ambulatory Visit (HOSPITAL_COMMUNITY)
Admission: RE | Admit: 2017-11-10 | Discharge: 2017-11-10 | Disposition: A | Discharge status: Home / Self Care | Payer: No Typology Code available for payment source | Source: Ambulatory Visit | Attending: Hematology | Admitting: Hematology

## 2017-11-10 ENCOUNTER — Encounter (HOSPITAL_COMMUNITY): Payer: Self-pay

## 2017-11-10 ENCOUNTER — Ambulatory Visit: Payer: 59 | Admitting: Family Medicine

## 2017-11-10 DIAGNOSIS — M8580 Other specified disorders of bone density and structure, unspecified site: Secondary | ICD-10-CM | POA: Insufficient documentation

## 2017-11-10 DIAGNOSIS — C3490 Malignant neoplasm of unspecified part of unspecified bronchus or lung: Secondary | ICD-10-CM

## 2017-11-10 HISTORY — PX: IR FLUORO GUIDE PORT INSERTION RIGHT: IMG5741

## 2017-11-10 HISTORY — PX: IR US GUIDE VASC ACCESS RIGHT: IMG2390

## 2017-11-10 LAB — CBC WITH DIFFERENTIAL/PLATELET
BLASTS: 0 %
Band Neutrophils: 10 %
Basophils Absolute: 0 10*3/uL (ref 0.0–0.1)
Basophils Relative: 0 %
Eosinophils Absolute: 0 10*3/uL (ref 0.0–0.7)
Eosinophils Relative: 0 %
HCT: 30.3 % — ABNORMAL LOW (ref 36.0–46.0)
HEMOGLOBIN: 10 g/dL — AB (ref 12.0–15.0)
LYMPHS PCT: 13 %
Lymphs Abs: 3.8 10*3/uL (ref 0.7–4.0)
MCH: 29.9 pg (ref 26.0–34.0)
MCHC: 33 g/dL (ref 30.0–36.0)
MCV: 90.4 fL (ref 78.0–100.0)
MYELOCYTES: 5 %
Metamyelocytes Relative: 2 %
Monocytes Absolute: 2.9 10*3/uL — ABNORMAL HIGH (ref 0.1–1.0)
Monocytes Relative: 10 %
NEUTROS PCT: 60 %
NRBC: 0 /100{WBCs}
Neutro Abs: 22.3 10*3/uL — ABNORMAL HIGH (ref 1.7–7.7)
OTHER: 0 %
PLATELETS: 144 10*3/uL — AB (ref 150–400)
PROMYELOCYTES ABS: 0 %
RBC: 3.35 MIL/uL — ABNORMAL LOW (ref 3.87–5.11)
RDW: 14.6 % (ref 11.5–15.5)
WBC: 29 10*3/uL — AB (ref 4.0–10.5)

## 2017-11-10 LAB — PROTIME-INR
INR: 1.02
PROTHROMBIN TIME: 13.3 s (ref 11.4–15.2)

## 2017-11-10 MED ORDER — LIDOCAINE HCL (PF) 1 % IJ SOLN
INTRAMUSCULAR | Status: AC | PRN
  Administered 2017-11-10: 5 mL

## 2017-11-10 MED ORDER — LIDOCAINE-EPINEPHRINE (PF) 2 %-1:200000 IJ SOLN
INTRAMUSCULAR | Status: AC
  Filled 2017-11-10: qty 20

## 2017-11-10 MED ORDER — FENTANYL CITRATE (PF) 100 MCG/2ML IJ SOLN
INTRAMUSCULAR | Status: AC
  Filled 2017-11-10: qty 2

## 2017-11-10 MED ORDER — MIDAZOLAM HCL 2 MG/2ML IJ SOLN
INTRAMUSCULAR | Status: AC
  Filled 2017-11-10: qty 4

## 2017-11-10 MED ORDER — MIDAZOLAM HCL 2 MG/2ML IJ SOLN
INTRAMUSCULAR | Status: AC | PRN
  Administered 2017-11-10 (×4): 1 mg via INTRAVENOUS

## 2017-11-10 MED ORDER — SODIUM CHLORIDE 0.9 % IV SOLN
INTRAVENOUS | Status: DC
  Administered 2017-11-10: 12:00:00 via INTRAVENOUS

## 2017-11-10 MED ORDER — LIDOCAINE-EPINEPHRINE (PF) 1 %-1:200000 IJ SOLN
INTRAMUSCULAR | Status: AC | PRN
  Administered 2017-11-10: 20 mL

## 2017-11-10 MED ORDER — HEPARIN SOD (PORK) LOCK FLUSH 100 UNIT/ML IV SOLN
INTRAVENOUS | Status: AC | PRN
  Administered 2017-11-10: 500 [IU] via INTRAVENOUS

## 2017-11-10 MED ORDER — CEFAZOLIN SODIUM-DEXTROSE 2-4 GM/100ML-% IV SOLN
2.0000 g | INTRAVENOUS | Status: AC
  Administered 2017-11-10: 2 g via INTRAVENOUS

## 2017-11-10 MED ORDER — FENTANYL CITRATE (PF) 100 MCG/2ML IJ SOLN
INTRAMUSCULAR | Status: AC | PRN
  Administered 2017-11-10 (×2): 50 ug via INTRAVENOUS

## 2017-11-10 MED ORDER — LIDOCAINE HCL 1 % IJ SOLN
INTRAMUSCULAR | Status: AC
  Filled 2017-11-10: qty 20

## 2017-11-10 MED ORDER — CEFAZOLIN SODIUM-DEXTROSE 2-4 GM/100ML-% IV SOLN
INTRAVENOUS | Status: AC
  Administered 2017-11-10: 2 g via INTRAVENOUS
  Filled 2017-11-10: qty 100

## 2017-11-10 MED ORDER — HEPARIN SOD (PORK) LOCK FLUSH 100 UNIT/ML IV SOLN
INTRAVENOUS | Status: AC
  Filled 2017-11-10: qty 5

## 2017-11-10 NOTE — Discharge Instructions (Signed)
Implanted Port Home Guide °An implanted port is a type of central line that is placed under the skin. Central lines are used to provide IV access when treatment or nutrition needs to be given through a person’s veins. Implanted ports are used for long-term IV access. An implanted port may be placed because: °· You need IV medicine that would be irritating to the small veins in your hands or arms. °· You need long-term IV medicines, such as antibiotics. °· You need IV nutrition for a long period. °· You need frequent blood draws for lab tests. °· You need dialysis. ° °Implanted ports are usually placed in the chest area, but they can also be placed in the upper arm, the abdomen, or the leg. An implanted port has two main parts: °· Reservoir. The reservoir is round and will appear as a small, raised area under your skin. The reservoir is the part where a needle is inserted to give medicines or draw blood. °· Catheter. The catheter is a thin, flexible tube that extends from the reservoir. The catheter is placed into a large vein. Medicine that is inserted into the reservoir goes into the catheter and then into the vein. ° °How will I care for my incision site? °Do not get the incision site wet. Bathe or shower as directed by your health care provider. °How is my port accessed? °Special steps must be taken to access the port: °· Before the port is accessed, a numbing cream can be placed on the skin. This helps numb the skin over the port site. °· Your health care provider uses a sterile technique to access the port. °? Your health care provider must put on a mask and sterile gloves. °? The skin over your port is cleaned carefully with an antiseptic and allowed to dry. °? The port is gently pinched between sterile gloves, and a needle is inserted into the port. °· Only "non-coring" port needles should be used to access the port. Once the port is accessed, a blood return should be checked. This helps ensure that the port  is in the vein and is not clogged. °· If your port needs to remain accessed for a constant infusion, a clear (transparent) bandage will be placed over the needle site. The bandage and needle will need to be changed every week, or as directed by your health care provider. °· Keep the bandage covering the needle clean and dry. Do not get it wet. Follow your health care provider’s instructions on how to take a shower or bath while the port is accessed. °· If your port does not need to stay accessed, no bandage is needed over the port. ° °What is flushing? °Flushing helps keep the port from getting clogged. Follow your health care provider’s instructions on how and when to flush the port. Ports are usually flushed with saline solution or a medicine called heparin. The need for flushing will depend on how the port is used. °· If the port is used for intermittent medicines or blood draws, the port will need to be flushed: °? After medicines have been given. °? After blood has been drawn. °? As part of routine maintenance. °· If a constant infusion is running, the port may not need to be flushed. ° °How long will my port stay implanted? °The port can stay in for as long as your health care provider thinks it is needed. When it is time for the port to come out, surgery will be   done to remove it. The procedure is similar to the one performed when the port was put in. When should I seek immediate medical care? When you have an implanted port, you should seek immediate medical care if:  You notice a bad smell coming from the incision site.  You have swelling, redness, or drainage at the incision site.  You have more swelling or pain at the port site or the surrounding area.  You have a fever that is not controlled with medicine.  This information is not intended to replace advice given to you by your health care provider. Make sure you discuss any questions you have with your health care provider. Document  Released: 08/12/2005 Document Revised: 01/18/2016 Document Reviewed: 04/19/2013 Elsevier Interactive Patient Education  2017 [place] Insertion, Care After This sheet gives you information about how to care for yourself after your procedure. Your health care provider may also give you more specific instructions. If you have problems or questions, contact your health care provider. What can I expect after the procedure? After your procedure, it is common to have:  Discomfort at the port insertion site.  Bruising on the skin over the port. This should improve over 3-4 days.  Follow these instructions at home: [facility] care  After your port is placed, you will get a manufacturer's information card. The card has information about your port. Keep this card with you at all times.  Take care of the port as told by your health care provider. Ask your health care provider if you or a family member can get training for taking care of the port at home. A home health care nurse may also take care of the port.  Make sure to remember what type of port you have. Incision care  Follow instructions from your health care provider about how to take care of your port insertion site. Make sure you: ? Wash your hands with soap and water before you change your bandage (dressing). If soap and water are not available, use hand sanitizer. ? Change your dressing as told by your health care provider. ? Leave stitches (sutures), skin glue, or adhesive strips in place. These skin closures may need to stay in place for 2 weeks or longer. If adhesive strip edges start to loosen and curl up, you may trim the loose edges. Do not remove adhesive strips completely unless your health care provider tells you to do that.  Check your port insertion site every day for signs of infection. Check for: ? More redness, swelling, or pain. ? More fluid or blood. ? Warmth. ? Pus or a bad smell. General  instructions  Do not take baths, swim, or use a hot tub until your health care provider approves.  Do not lift anything that is heavier than 10 lb (4.5 kg) for a week, or as told by your health care provider.  Ask your health care provider when it is okay to: ? Return to work or school. ? Resume usual physical activities or sports.  Do not drive for 24 hours if you were given a medicine to help you relax (sedative).  Take over-the-counter and prescription medicines only as told by your health care provider.  Wear a medical alert bracelet in case of an emergency. This will tell any health care providers that you have a port.  Keep all follow-up visits as told by your health care provider. This is important. Contact a health care provider if:  You cannot flush your port with saline as directed, or you cannot draw blood from the port.  You have a fever or chills.  You have more redness, swelling, or pain around your port insertion site.  You have more fluid or blood coming from your port insertion site.  Your port insertion site feels warm to the touch.  You have pus or a bad smell coming from the port insertion site. Get help right away if:  You have chest pain or shortness of breath.  You have bleeding from your port that you cannot control. Summary  Take care of the port as told by your health care provider.  Change your dressing as told by your health care provider.  Keep all follow-up visits as told by your health care provider. This information is not intended to replace advice given to you by your health care provider. Make sure you discuss any questions you have with your health care provider. Document Released: 06/02/2013 Document Revised: 07/03/2016 Document Reviewed: 07/03/2016 Elsevier Interactive Patient Education  2017 [place].   Moderate Conscious Sedation, Adult, Care After These instructions provide you with information about caring for yourself  after your procedure. Your health care provider may also give you more specific instructions. Your treatment has been planned according to current medical practices, but problems sometimes occur. Call your health care provider if you have any problems or questions after your procedure. What can I expect after the procedure? After your procedure, it is common:  To feel sleepy for several hours.  To feel clumsy and have poor balance for several hours.  To have poor judgment for several hours.  To vomit if you eat too soon.  Follow these instructions at home: For at least 24 hours after the procedure:   Do not: ? Participate in activities where you could fall or become injured. ? Drive. ? Use heavy machinery. ? Drink alcohol. ? Take sleeping pills or medicines that cause drowsiness. ? Make important decisions or sign legal documents. ? Take care of children on your own.  Rest. Eating and drinking  Follow the diet recommended by your health care provider.  If you vomit: ? Drink water, juice, or soup when you can drink without vomiting. ? Make sure you have little or no nausea before eating solid foods. General instructions  Have a responsible adult stay with you until you are awake and alert.  Take over-the-counter and prescription medicines only as told by your health care provider.  If you smoke, do not smoke without supervision.  Keep all follow-up visits as told by your health care provider. This is important. Contact a health care provider if:  You keep feeling nauseous or you keep vomiting.  You feel light-headed.  You develop a rash.  You have a fever. Get help right away if:  You have trouble breathing. This information is not intended to replace advice given to you by your health care provider. Make sure you discuss any questions you have with your health care provider. Document Released: 06/02/2013 Document Revised: 01/15/2016 Document Reviewed:  12/02/2015 Elsevier Interactive Patient Education  [employer].

## 2017-11-10 NOTE — H&P (Signed)
Referring Physician(s): Brunetta Genera  Supervising Physician: Markus Daft  Patient Status:  [facility] OP  Chief Complaint:  "I'm getting a port a cath"  Subjective: Patient familiar to IR service from prior left liver lesion biopsy in February of this year.  She has a history of metastatic small cell lung cancer and presents today for Port-A-Cath placement for chemotherapy.  She currently denies fever, headache, chest pain, abdominal pain, nausea, vomiting or bleeding.  She has had weight loss, dyspnea with exertion, occasional cough and some intermittent back pain. Past Medical History:  Diagnosis Date  . Allergy   . Osteopenia    Past Surgical History:  Procedure Laterality Date  . COLONOSCOPY    . COLONOSCOPY WITH PROPOFOL N/A 10/25/2014   Procedure: COLONOSCOPY WITH PROPOFOL;  Surgeon: Garlan Fair, MD;  Location: [facility] ENDOSCOPY;  Service: Endoscopy;  Laterality: N/A;  . ERCP N/A 10/15/2017   Procedure: ENDOSCOPIC RETROGRADE CHOLANGIOPANCREATOGRAPHY (ERCP);  Surgeon: Clarene Essex, MD;  Location: [facility];  Service: Endoscopy;  Laterality: N/A;  . IR RADIOLOGIST EVAL & MGMT  10/02/2017      Allergies: Patient has no known allergies.  Medications: Prior to Admission medications   Medication Sig Start Date End Date Taking? Authorizing Provider  dexamethasone (DECADRON) 4 MG tablet Take 1 tablet (4 mg total) by mouth daily with breakfast. 10/22/17  Yes Brunetta Genera, MD  fluconazole (DIFLUCAN) 100 MG tablet Take 1 tablet (100 mg total) by mouth daily. 11/03/17  Yes Tanner, Lyndon Code., PA-C  magic mouthwash w/lidocaine SOLN Take 10 mLs by mouth 4 (four) times daily as needed for mouth pain. Rinse and spit or swallow 11/03/17  Yes Tanner, Lyndon Code., PA-C  oxyCODONE (OXY IR/ROXICODONE) 5 MG immediate release tablet Take 1-2 tablets (5-10 mg total) by mouth every 4 (four) hours as needed for severe pain. 10/22/17  Yes Brunetta Genera, MD  oxyCODONE (OXYCONTIN) 15 mg 12 hr  tablet Take 1 tablet (15 mg total) by mouth every 12 (twelve) hours. 10/22/17  Yes Brunetta Genera, MD  polyethylene glycol [facility]) packet Take 17 g by mouth daily as needed for moderate constipation. 10/18/17  Yes Bonnielee Haff, MD  senna-docusate (SENNA S) 8.6-50 MG tablet Take 2 tablets by mouth at bedtime. 09/30/17  Yes Brunetta Genera, MD  lidocaine-prilocaine (EMLA) cream Apply to affected area once Patient not taking: Reported on 11/05/2017 10/22/17   Brunetta Genera, MD  LORazepam (ATIVAN) 0.5 MG tablet Take 1 tablet (0.5 mg total) by mouth every 6 (six) hours as needed (Nausea or vomiting). Patient not taking: Reported on 11/05/2017 10/22/17   Brunetta Genera, MD  ondansetron (ZOFRAN) 4 MG tablet Take 1 tablet (4 mg total) by mouth every 8 (eight) hours as needed for nausea or vomiting. Patient not taking: Reported on 11/05/2017 10/18/17   Bonnielee Haff, MD  prochlorperazine (COMPAZINE) 10 MG tablet Take 1 tablet (10 mg total) by mouth every 6 (six) hours as needed (Nausea or vomiting). Patient not taking: Reported on 11/05/2017 10/22/17   Brunetta Genera, MD  tiZANidine (ZANAFLEX) 4 MG tablet Take 1 tablet (4 mg total) by mouth every 8 (eight) hours as needed for muscle spasms. Patient not taking: Reported on 11/05/2017 10/18/17   Bonnielee Haff, MD     Vital Signs: Temperature 97.5, blood pressure 109/65, heart rate 75, respirations 18, O2 sat 94% room air   Physical Exam awake, alert.  Chest with distant but clear breath sounds bilaterally.  Heart with  regular rate and rhythm.  Abdomen soft, positive bowel sounds, nontender.  No lower extremity edema.  Imaging: No results found.  Labs:  CBC: Recent Labs    10/17/17 0314 10/18/17 0405 10/22/17 1507 10/27/17 1002 11/03/17 0943 11/05/17 0859  WBC 5.6 5.1 5.4 8.0 3.5* 2.4*  HGB 12.0 11.7*  --  12.1  --  10.9*  HCT 35.0* 34.0* 37.2 34.7* 37.9 33.2*  PLT 219 211 167 181 227 130*    COAGS: Recent Labs      10/15/17 0400 10/22/17 1507  INR 1.09 1.17  APTT  --  34    BMP: Recent Labs    10/27/17 1002 11/03/17 0943 11/03/17 1307 11/05/17 0859  NA 126* 137 140 139  K 3.6 6.1* 4.2 5.1  CL 91* 103 107 106  CO2 23 25 23 26   GLUCOSE 124 116 128 122  BUN 9 21 18 20   CALCIUM 9.6 9.3 8.2* 9.6  CREATININE 0.63 0.79 0.69 0.75  GFRNONAA >60 >60 >60 >60  GFRAA >60 >60 >60 >60    LIVER FUNCTION TESTS: Recent Labs    10/22/17 1507 10/27/17 1002 11/03/17 0943 11/05/17 0859  BILITOT 8.9* 3.7* 2.5* 2.2*  AST 257* 108* 84* 81*  ALT 201* 119* 65* 55  ALKPHOS 457* 319* 225* 205*  PROT 6.5 6.7 7.2 6.8  ALBUMIN 2.7* 2.7* 3.0* 2.9*    Assessment and Plan: Patient with history of metastatic small cell lung cancer; presents today for Port-A-Cath placement for chemotherapy.Risks and benefits of image guided port-a-catheter placement was discussed with the patient/sister including, but not limited to bleeding, infection, pneumothorax, or fibrin sheath development and need for additional procedures.  All of the patient's questions were answered, patient is agreeable to proceed. Consent signed and in chart.     Electronically Signed: D. Rowe Robert, PA-C 11/10/2017, 12:27 PM   I spent a total of 20 minutes at the the patient's bedside AND on the patient's hospital floor or unit, greater than 50% of which was counseling/coordinating care for Port-A-Cath placement

## 2017-11-10 NOTE — Procedures (Signed)
Placement of right jugular port.  Tip at SVC/RA junction.  Minimal blood loss and no immediate complication.

## 2017-11-12 ENCOUNTER — Other Ambulatory Visit: Payer: Self-pay | Admitting: Hematology

## 2017-11-12 ENCOUNTER — Ambulatory Visit
Admission: RE | Admit: 2017-11-12 | Discharge: 2017-11-12 | Disposition: A | Discharge status: Home / Self Care | Payer: 59 | Source: Ambulatory Visit | Attending: Interventional Radiology | Admitting: Interventional Radiology

## 2017-11-12 DIAGNOSIS — C3490 Malignant neoplasm of unspecified part of unspecified bronchus or lung: Secondary | ICD-10-CM

## 2017-11-12 DIAGNOSIS — C801 Malignant (primary) neoplasm, unspecified: Secondary | ICD-10-CM

## 2017-11-12 DIAGNOSIS — M8448XA Pathological fracture, other site, initial encounter for fracture: Secondary | ICD-10-CM

## 2017-11-12 DIAGNOSIS — C7951 Secondary malignant neoplasm of bone: Secondary | ICD-10-CM

## 2017-11-12 NOTE — Progress Notes (Signed)
HEMATOLOGY/ONCOLOGY CLINIC NOTE  Date of Service: 11/13/2017  Patient Care Team: [facility], Modena Nunnery, MD as PCP - General (Family Medicine)  CHIEF COMPLAINTS/PURPOSE OF CONSULTATION:   F/u for continued management of extensive stage small cell lung cancer.  HISTORY OF PRESENTING ILLNESS:   Haley [name] is a wonderful 67 y.o. female who has been referred to [place] by Orthopedists Dr. Vickki Hearing for evaluation and management of Bone Marrow Abnormality. Her PCP is Dr. Buelah Manis.   She presents to the clinic today accompanied by her sons and her daughter-in-law. She notes she has several issues since October 2018.  She had colitis in 05/2017 with significant diarrhea and abdominal cramping --now mostly resolved.  She was treated with antibiotics. Afterward she had bronchitis. She was given albuterol and continues to take it as she has remaining cough (smoking contributed to this).   On 08/07/17 she notes she has back pain in her lower back the past couple of months. 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 Next day she saw her physician, who had done a xray and told her she had arthritis. She was treated with prednisone and gabapentin. After no back pain improvements on treatment, she has yet to return to work. She went to orthopedist and had a Lumbar MRI on 09/25/17. This shows a compression fracture of the L2 with abnormalities in her bone marrow.   She quit smoking 09/24/17. She had been smoking 1 pack a day for 50 years, since she was 67 yo. She still has cough from smoking but not as significant.   She denies heart conditions or disease, GI issues or diseases or other significant medical issues. DM runs in her family but no known cancers, blood disorders, or gene mutations. Prior to her health in the past 6 months she has lead a independent overall healthy life. Her last mammogram in 04/2017 was negative. Last colonoscopy in 2015 and last P[place]ear in 2017, were negative.  She had a bone density scan in 2009. She has never been on HR therapy.   Given her significant pain (up to 10/10). She currently takes hydrocodone 1 tablet q4-5hours. This only reduced her pain to 7-8/10. She takes Tizanidine 42m q5-6hours. She has a back brace that she does not use.   On review of symptoms, pt notes ongoing b/l hip pain, lower back pain which can radiate up the side and down her legs and buttocks. The pain can keep her up at night. She notes when she lays on her right side, her throat pain which causes her to cough. This started around time of bronchitis and has not improved. When she does cough she produces clear phlegm.    Interval History:   SPATTRICIA WEIHERreturns today regarding her extensive stage metastatic small cell lung cancer. The patient's last visit with [place] on 11/05/17. She is accompanied today by her sister. The pt reports that she is doing very well. She will be beginning C2 next week on 11/17/17.   The pt reports that she has been eating very well and has gained 5 lbs and is still trying to get back up to her normal 125 lb weight. She notes that her mouth and throat pain has resolved. She notes that she has finished the Dicflucan, and uses the magic mouthwash prn. She notes that her back pain is bearable and getting better, and she has been able to decrease her Oxycodone use to once per day  while maintaining Oxycontin once per day.   She notes that the quality of her sleep is different each day and notes some intermittent anxiety. She notes that she has not been taking her Ativan, but is open to taking this prn.   Of note since the patient's last visit, pt has had her port placed on 11/10/17.   Lab results today (11/13/17) of CBC, CMP, and Reticulocytes is as follows: all values are WNL except for WBC at 26.2k, RBC at 3.26, Hgb at 9.6, HCT at 29.6, RDW at 14.7, Platelets at 110k, Neutro Abs at 22.9k, Monocytes Abs at 1.3k, Sodium at 133, Total Protein at 5.8,  Albumin at 2.9, Alk Phos at 362, Total Bilirubin at 1.4. Magnesium 11/13/17 is WNL at 2.0.  On review of systems, pt reports resolved throat pain, gaining weight, resolving back pain, normal bowel movement, and denies fevers, chills, night sweats, and any other new symptoms.    MEDICAL HISTORY:  Past Medical History:  Diagnosis Date  . Allergy   . Osteopenia     SURGICAL HISTORY: Past Surgical History:  Procedure Laterality Date  . COLONOSCOPY    . COLONOSCOPY WITH PROPOFOL N/A 10/25/2014   Procedure: COLONOSCOPY WITH PROPOFOL;  Surgeon: Garlan Fair, MD;  Location: [facility]COPY;  Service: Endoscopy;  Laterality: N/A;  . ERCP N/A 10/15/2017   Procedure: ENDOSCOPIC RETROGRADE CHOLANGIOPANCREATOGRAPHY (ERCP);  Surgeon: Clarene Essex, MD;  Location: [facility];  Service: Endoscopy;  Laterality: N/A;  . IR FLUORO GUIDE PORT INSERTION RIGHT  11/10/2017  . IR RADIOLOGIST EVAL & MGMT  10/02/2017  . IR US GUIDE VASC ACCESS RIGHT  11/10/2017    SOCIAL HISTORY: Social History   Socioeconomic History  . Marital status: Single    Spouse name: Not on file  . Number of children: Not on file  . Years of education: Not on file  . Highest education level: Not on file  Occupational History  . Not on file  Social Needs  . Financial resource strain: Not on file  . Food insecurity:    Worry: Not on file    Inability: Not on file  . Transportation needs:    Medical: Not on file    Non-medical: Not on file  Tobacco Use  . Smoking status: Former Smoker    Packs/day: 1.00    Years: 40.00    Pack years: 40.00    Types: Cigarettes    Last attempt to quit: 09/24/2017    Years since quitting: 0.1  . Smokeless tobacco: Never Used  Substance and Sexual Activity  . Alcohol use: Yes    Comment: rare social  . Drug use: No  . Sexual activity: Not Currently  Lifestyle  . Physical activity:    Days per week: Not on file    Minutes per session: Not on file  . Stress: Not on file  Relationships    . Social connections:    Talks on phone: Not on file    Gets together: Not on file    Attends religious service: Not on file    Active member of club or organization: Not on file    Attends meetings of clubs or organizations: Not on file    Relationship status: Not on file  . Intimate partner violence:    Fear of current or ex partner: Not on file    Emotionally abused: Not on file    Physically abused: Not on file    Forced sexual activity: Not on file  Other Topics Concern  . Not on file  Social History Narrative  . Not on file    FAMILY HISTORY: Family History  Problem Relation Age of Onset  . Diabetes Mother   . Hypertension Mother   . Diabetes Sister   . Hypertension Sister   . Hyperlipidemia Sister     ALLERGIES:  has No Known Allergies.  MEDICATIONS:  Current Outpatient Medications  Medication Sig Dispense Refill  . dexamethasone (DECADRON) 4 MG tablet TAKE 1 TABLET BY MOUTH DAILY WITH BREAKFAST 20 tablet 0  . fluconazole (DIFLUCAN) 100 MG tablet Take 1 tablet (100 mg total) by mouth daily. 10 tablet 0  . lidocaine-prilocaine (EMLA) cream Apply to affected area once 30 g 3  . LORazepam (ATIVAN) 0.5 MG tablet Take 1 tablet (0.5 mg total) by mouth every 6 (six) hours as needed (Nausea or vomiting). 30 tablet 0  . magic mouthwash w/lidocaine SOLN Take 10 mLs by mouth 4 (four) times daily as needed for mouth pain. Rinse and spit or swallow 240 mL 2  . ondansetron (ZOFRAN) 4 MG tablet Take 1 tablet (4 mg total) by mouth every 8 (eight) hours as needed for nausea or vomiting. 20 tablet 0  . oxyCODONE (OXY IR/ROXICODONE) 5 MG immediate release tablet Take 1-2 tablets (5-10 mg total) by mouth every 4 (four) hours as needed for severe pain. 60 tablet 0  . oxyCODONE (OXYCONTIN) 15 mg 12 hr tablet Take 1 tablet (15 mg total) by mouth every 12 (twelve) hours. 60 tablet 0  . polyethylene glycol (MIRALAX) packet Take 17 g by mouth daily as needed for moderate constipation. 30 each  0  . prochlorperazine (COMPAZINE) 10 MG tablet Take 1 tablet (10 mg total) by mouth every 6 (six) hours as needed (Nausea or vomiting). 30 tablet 1  . senna-docusate (SENNA S) 8.6-50 MG tablet Take 2 tablets by mouth at bedtime. 60 tablet 1  . tiZANidine (ZANAFLEX) 4 MG tablet Take 1 tablet (4 mg total) by mouth every 8 (eight) hours as needed for muscle spasms. 30 tablet 0   No current facility-administered medications for this visit.     REVIEW OF SYSTEMS:    .10 Point review of Systems was done is negative except as noted above.   PHYSICAL EXAMINATION: ECOG PERFORMANCE STATUS: 2 - Symptomatic, <50% confined to bed  . Vitals:   11/13/17 1004  BP: 99/61  Pulse: 85  Resp: 16  Temp: 97.8 F (36.6 C)  SpO2: 97%   There were no vitals filed for this visit. .There is no height or weight on file to calculate BMI. Marland Kitchen GENERAL:alert, in no acute distress and comfortable SKIN: no acute rashes, no significant lesions EYES: conjunctiva are pink and non-injected, sclera anicteric OROPHARYNX: MMM, no exudates, no oropharyngeal erythema or ulceration NECK: supple, no JVD LYMPH:  no palpable lymphadenopathy in the cervical, axillary or inguinal regions LUNGS: clear to auscultation b/l with normal respiratory effort HEART: regular rate & rhythm ABDOMEN:  normoactive bowel sounds , non tender, not distended. Extremity: no pedal edema PSYCH: alert & oriented x 3 with fluent speech NEURO: no focal motor/sensory deficits    LABORATORY DATA:   . CBC Latest Ref Rng & Units 11/13/2017 11/10/2017 11/05/2017  WBC 3.9 - 10.3 K/uL 26.2(H) 29.0(H) 2.4(L)  Hemoglobin 12.0 - 15.0 g/dL - 10.0(L) 10.9(L)  Hematocrit 34.8 - 46.6 % 29.6(L) 30.3(L) 33.2(L)  Platelets 145 - 400 K/uL 110(L) 144(L) 130(L)  HGB 9.6  . CMP Latest Ref Rng &  Units 11/13/2017 11/05/2017 11/03/2017  Glucose 70 - 140 mg/dL 136 122 128  BUN 7 - 26 mg/dL 9 20 18   Creatinine 0.60 - 1.10 mg/dL 0.66 0.75 0.69  Sodium 136 - 145  mmol/L 133(L) 139 140  Potassium 3.5 - 5.1 mmol/L 3.6 5.1 4.2  Chloride 98 - 109 mmol/L 100 106 107  CO2 22 - 29 mmol/L 23 26 23   Calcium 8.4 - 10.4 mg/dL 8.9 9.6 8.2(L)  Total Protein 6.4 - 8.3 g/dL 5.8(L) 6.8 -  Total Bilirubin 0.2 - 1.2 mg/dL 1.4(H) 2.2(H) -  Alkaline Phos 40 - 150 U/L 362(H) 205(H) -  AST 5 - 34 U/L 25 81(H) -  ALT 0 - 55 U/L 24 55 -     10/17/17 Liver needle/core biopsy   Fine Needle Aspiration, EUS, Pancreas3/12/2017 [facility] Result Narrative  ACCESSION NUMBER: [id number] RECEIVED: 10/24/2017 ORDERING PHYSICIAN: Harley Hallmark , MD PATIENT NAME: Celestia Khat A CYTOLOGY REPORT  Final Cytologic Interpretation  Pancreas, endoscopic ultrasound-guided, fine needle aspiration II (smears and cell block): 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  Specimen Adequacy:Satisfactory for evaluation.    COMMENT:The smears show clusters of neoplastic cells with neuroendocrine nuclear features and scant cytoplasm.  Immunoperoxidase studies on limited cell block material show the neoplastic cells stain positive for synaptophysin, CD56, cytokeratin AE1/AE3 (Golgi-dot like staining) and TTF-1. The MIB-1 shows a high proliferation index with positive staining in approximately 80% of the cells. Together with the cytomorphology this supports the diagnosis of small cell carcinoma. Given the presence of a suspected right infrahilar lung mass on MRI per [facility] and a pancreatic mass, I cannot be certain of the site of origin and whether this represents a primary pancreatic small cell poorly differentiated endocrine carcinoma versus a metastatic small cell carcinoma of lung origin (both can express TTF-1). Clinical correlation advised.  Dr. Romilda Garret has reviewed the case in consultation and agrees with the diagnosis of small cell carcinoma.   The positive controls worked appropriately   "These tests were  developed and their performance characteristics determined by [facility].They have not been cleared or approved by the U.S. Food and Drug Administration.The FDA has determined that such clearance or approval is not necessary.These tests are used for clinical purposes.They should not be regarded as investigational or for research.This laboratory is certified under the [place] (CLIA) as qualified to perform high complexity clinical laboratory testing."   Report Prepared By:O.A. Sheryle Hail, M.D.  I have personally reviewed the slides and/or other related materials referenced, and have edited the report as part of my pathologic assessment and final interpretation.  Electronically Signed Out By: Blythe Stanford , MD 10/28/2017 20:55:03  sb/oah  Specimen(s) Received:  Pancreas, endoscopic ultrasound-guided, fine needle aspiration II (smears and cell block)  Clinical History obstructive jaundice  Preliminary (on-site) Interpretation  Adequate. L.Cox CT(ASCP) 10/24/17     RADIOGRAPHIC STUDIES: I have personally reviewed the radiological images as listed and agreed with the findings in the report. Mr Jeri Cos Wo Contrast  Result Date: 10/29/2017 CLINICAL DATA:  Staging of small cell lung cancer. EXAM: MRI HEAD WITHOUT AND WITH CONTRAST TECHNIQUE: Multiplanar, multiecho pulse sequences of the brain and surrounding structures were obtained without and with intravenous contrast. CONTRAST:  20m MULTIHANCE GADOBENATE DIMEGLUMINE 529 MG/ML IV SOLN COMPARISON:  None. FINDINGS: Brain: Positive for metastatic disease. The patient's brain metastases are all subcentimeter and show no significant vasogenic edema. The metastases  are best seen on diffusion imaging as they are subtly enhancing. Post-contrast visualization is best on the sagittal acquisition, which is also the latest. At least 12  brain metastases are seen in labeled on series 77. Many these are visible on series 15. Two punctate enhancing lesions seen on series 15 are not clearly seen on diffusion imaging, located right frontal on 6:15 and left periventricular on 14:15. This gives a total of 14 visualized brain metastases. No evidence of acute infarct, hemorrhage, hydrocephalus, or collection. Vascular: Major flow voids are preserved. Skull and upper cervical spine: Infiltrative marrow lesions seen in the clivus, C2 body, C2 left lateral mass, C3 left articular process, and C4 body and left articular process. Calvarial metastases are also noted. Sinuses/Orbits: Remote blowout fracture of the medial wall left orbit. No evidence of orbital metastatic disease. Other: These results will be called to the ordering clinician or representative by the Radiologist Assistant, and communication documented in the PACS or zVision Dashboard. IMPRESSION: 1. Positive for multiple subcentimeter brain metastases as recorded above. No associated mass effect or edema. 2. Multifocal osseous metastatic disease. Electronically Signed   By: Monte Fantasia M.D.   On: 10/29/2017 11:26   Mr 3d Recon At Scanner  Result Date: 10/15/2017 CLINICAL DATA:  Metastatic carcinoma of uncertain primary. Abnormal liver function tests and jaundice. EXAM: MRI ABDOMEN WITHOUT AND WITH CONTRAST (INCLUDING MRCP) TECHNIQUE: Multiplanar multisequence MR imaging of the abdomen was performed both before and after the administration of intravenous contrast. Heavily T2-weighted images of the biliary and pancreatic ducts were obtained, and three-dimensional MRCP images were rendered by post processing. CONTRAST:  44m MULTIHANCE GADOBENATE DIMEGLUMINE 529 MG/ML IV SOLN COMPARISON:  PET-CT dated 10/09/2017 and CT abdomen from 05/24/2017 FINDINGS: Lower chest: Airspace opacity and some volume loss in the right lower lobe is again observed with a suspected right infrahilar mass only  partially seen today. Borderline cardiomegaly. Small right and trace left pleural effusions. Hepatobiliary: Moderate intrahepatic biliary dilatation with CBD dilatation up to 1.3 cm in diameter, and abrupt truncation of the common bile duct in the vicinity of the pancreatic head mass. There are approximately 20 hypoenhancing masses scattered in the liver. One lesion in segment 3 of the liver measures 2.7 by 2.0 cm, previously 2.6 by 2.0 cm on 10/09/2017 by my measurement. There is mild gallbladder wall thickening with accentuated precontrast T1 signal in the gallbladder favoring sludge. Pancreas: Hypoenhancing mass in the head of the pancreas measures proximally 3.4 by 3.1 cm on image [id number]/[id number]. Mildly dilated dorsal pancreatic duct extending to the margin of this mass as shown on image 25/401. Trace peripancreatic edema. Spleen:  Unremarkable.  Trace perisplenic ascites. Adrenals/Urinary Tract: 1.1 by 1.8 cm left adrenal mass was previously hypermetabolic, and probably represents a metastatic lesion. The kidneys appear unremarkable. Trace right greater than left perirenal stranding. Stomach/Bowel: No dilated bowel identified. Vascular/Lymphatic:  Aortoiliac atherosclerotic vascular disease. Other:  No supplemental non-categorized findings. Musculoskeletal: Scattered widespread osseous metastatic lesions including the sternum, ribs, spine, and bony pelvis. L2 compression fracture with associated enhancement, and possibly acute component of fracture. IMPRESSION: 1. 3.4 cm pancreatic head mass is obstructing the common bile duct, which measures up to 1.3 cm in diameter. There is also moderate intrahepatic biliary dilatation. The mass also causes truncation of the dorsal pancreatic duct which is mildly dilated. 2. Approximately 20 metastatic lesions are identified in the liver. 3. Right lower lobe airspace opacity and atelectasis. Suspected right infrahilar mass. 4. Small right and trace left  pleural effusions. 5. Small  left adrenal mass is suspicious for a metastatic lesion. 6. Scattered widespread osseous metastatic disease. 7. Other imaging findings of potential clinical significance: Aortic Atherosclerosis (ICD10-I70.0). L2 compression fracture as shown on prior MRI. Mild upper abdominal ascites with some trace edema along the mesentery and along several organs. Suspected gallbladder sludge with borderline distended gallbladder. Electronically Signed   By: Van Clines M.D.   On: 10/15/2017 09:55   US Biopsy (liver)  Result Date: 10/17/2017 INDICATION: PANCREAS MASS WITH LIVER METASTASES EXAM: ULTRASOUND GUIDED CORE BIOPSY OF LEFT HEPATIC MASS MEDICATIONS: 1% lidocaine local ANESTHESIA/SEDATION: Fentanyl 50 mcg IV; Versed 1.0 mg IV Moderate Sedation Time:  10 minutes The patient was continuously monitored during the procedure by the interventional radiology nurse under my direct supervision. PROCEDURE: The procedure, risks, benefits, and alternatives were explained to the patient. Questions regarding the procedure were encouraged and answered. The patient understands and consents to the procedure. Previous imaging reviewed. Preliminary ultrasound performed. A left hepatic hypoechoic solid mass was demonstrated in the epigastric region. Overlying skin marked. The epigastric region was prepped with ChloraPrep in a sterile fashion, and a sterile drape was applied covering the operative field. A sterile gown and sterile gloves were used for the procedure. Local anesthesia was provided with 1% Lidocaine. Under sterile conditions and local anesthesia, a 17 gauge 6.8 cm access needle was advanced from an epigastric percutaneous approach to the left hepatic mass. Needle position confirmed with ultrasound. 3 18 gauge core biopsies obtained through the lesion. Images obtained for documentation. Samples placed in formalin. Needle tract occlusion performed with Gel-Foam. Postprocedure imaging demonstrates no hemorrhage or  hematoma. Patient tolerated the biopsy well. COMPLICATIONS: None immediate. FINDINGS: Imaging confirms needle placed in the left hepatic mass in the lateral segment anteriorly. IMPRESSION: Successful CT-guided left hepatic mass 18 gauge core biopsy Electronically Signed   By: Jerilynn Mages.  Shick M.D.   On: 10/17/2017 13:19   Ir US Guide Vasc Access Right  Result Date: 11/10/2017 INDICATION: 67 year old with extensive stage primary small cell carcinoma of lung. Port-A-Cath needed for chemotherapy. EXAM: FLUOROSCOPIC AND ULTRASOUND GUIDED PLACEMENT OF A SUBCUTANEOUS PORT COMPARISON:  None. MEDICATIONS: Ancef 2 g; The antibiotic was administered within an appropriate time interval prior to skin puncture. ANESTHESIA/SEDATION: Versed 4.0 mg IV; Fentanyl 100 mcg IV; Moderate Sedation Time:  32 minutes The patient was continuously monitored during the procedure by the interventional radiology nurse under my direct supervision. FLUOROSCOPY TIME:  24 seconds, 1 mGy COMPLICATIONS: None immediate. PROCEDURE: The procedure, risks, benefits, and alternatives were explained to the patient. Questions regarding the procedure were encouraged and answered. The patient understands and consents to the procedure. Patient was placed supine on the interventional table. Ultrasound confirmed a patent right internal jugular vein. The right chest and neck were cleaned with a skin antiseptic and a sterile drape was placed. Maximal barrier sterile technique was utilized including caps, mask, sterile gowns, sterile gloves, sterile drape, hand hygiene and skin antiseptic. The right neck was anesthetized with 1% lidocaine. Small incision was made in the right neck with a blade. Micropuncture set was placed in the right internal jugular vein with ultrasound guidance. The micropuncture wire was used for measurement purposes. The right chest was anesthetized with 1% lidocaine with epinephrine. #15 blade was used to make an incision and a subcutaneous port  pocket was formed. [place] was assembled. Subcutaneous tunnel was formed with a stiff tunneling device. The port catheter was brought through the subcutaneous  tunnel. The port was placed in the subcutaneous pocket. The micropuncture set was exchanged for a peel-away sheath. The catheter was placed through the peel-away sheath and the tip was positioned at the superior cavoatrial junction. Catheter placement was confirmed with fluoroscopy. The port was accessed and flushed with heparinized saline. The port pocket was closed using two layers of absorbable sutures and Dermabond. The vein skin site was closed using a single layer of absorbable suture and Dermabond. Sterile dressings were applied. Patient tolerated the procedure well without an immediate complication. Ultrasound and fluoroscopic images were taken and saved for this procedure. IMPRESSION: Placement of a CT injectable Port-A-Cath. Electronically Signed   By: Markus Daft M.D.   On: 11/10/2017 15:09   Dg Ercp Biliary & Pancreatic Ducts  Result Date: 10/15/2017 CLINICAL DATA:  Unsuccessful attempted ERCP EXAM: ERCP TECHNIQUE: Multiple spot images obtained with the fluoroscopic device and submitted for interpretation post-procedure. COMPARISON:  MRCP - 10/15/2017 FLUOROSCOPY TIME:  25 minutes, 45 seconds (175.39 mGy) FINDINGS: There are 4 spot intraoperative fluoroscopic images of the right upper abdominal quadrant provided for review Initial image demonstrates an ERCP probe overlying the right upper abdominal quadrant. There is an additional thin drainage catheter overlying the expected location of the pancreatic duct. Subsequent images demonstrate contrast injection without definitive opacification of a recognizable feature of the biliary system. IMPRESSION: 1. Attempted ERCP as above. 2. Additional thin drainage catheter overlies expected location of the pancreatic duct. Above discussed with Dr. Watt Climes prior to completion of the procedure  Electronically Signed   By: Sandi Mariscal M.D.   On: 10/15/2017 14:08   Mr Abdomen Mrcp Moise Boring Contast  Result Date: 10/15/2017 CLINICAL DATA:  Metastatic carcinoma of uncertain primary. Abnormal liver function tests and jaundice. EXAM: MRI ABDOMEN WITHOUT AND WITH CONTRAST (INCLUDING MRCP) TECHNIQUE: Multiplanar multisequence MR imaging of the abdomen was performed both before and after the administration of intravenous contrast. Heavily T2-weighted images of the biliary and pancreatic ducts were obtained, and three-dimensional MRCP images were rendered by post processing. CONTRAST:  30m MULTIHANCE GADOBENATE DIMEGLUMINE 529 MG/ML IV SOLN COMPARISON:  PET-CT dated 10/09/2017 and CT abdomen from 05/24/2017 FINDINGS: Lower chest: Airspace opacity and some volume loss in the right lower lobe is again observed with a suspected right infrahilar mass only partially seen today. Borderline cardiomegaly. Small right and trace left pleural effusions. Hepatobiliary: Moderate intrahepatic biliary dilatation with CBD dilatation up to 1.3 cm in diameter, and abrupt truncation of the common bile duct in the vicinity of the pancreatic head mass. There are approximately 20 hypoenhancing masses scattered in the liver. One lesion in segment 3 of the liver measures 2.7 by 2.0 cm, previously 2.6 by 2.0 cm on 10/09/2017 by my measurement. There is mild gallbladder wall thickening with accentuated precontrast T1 signal in the gallbladder favoring sludge. Pancreas: Hypoenhancing mass in the head of the pancreas measures proximally 3.4 by 3.1 cm on image [id number]. Mildly dilated dorsal pancreatic duct extending to the margin of this mass as shown on image 25/401. Trace peripancreatic edema. Spleen:  Unremarkable.  Trace perisplenic ascites. Adrenals/Urinary Tract: 1.1 by 1.8 cm left adrenal mass was previously hypermetabolic, and probably represents a metastatic lesion. The kidneys appear unremarkable. Trace right greater than left  perirenal stranding. Stomach/Bowel: No dilated bowel identified. Vascular/Lymphatic:  Aortoiliac atherosclerotic vascular disease. Other:  No supplemental non-categorized findings. Musculoskeletal: Scattered widespread osseous metastatic lesions including the sternum, ribs, spine, and bony pelvis. L2 compression fracture with associated enhancement, and possibly acute  component of fracture. IMPRESSION: 1. 3.4 cm pancreatic head mass is obstructing the common bile duct, which measures up to 1.3 cm in diameter. There is also moderate intrahepatic biliary dilatation. The mass also causes truncation of the dorsal pancreatic duct which is mildly dilated. 2. Approximately 20 metastatic lesions are identified in the liver. 3. Right lower lobe airspace opacity and atelectasis. Suspected right infrahilar mass. 4. Small right and trace left pleural effusions. 5. Small left adrenal mass is suspicious for a metastatic lesion. 6. Scattered widespread osseous metastatic disease. 7. Other imaging findings of potential clinical significance: Aortic Atherosclerosis (ICD10-I70.0). L2 compression fracture as shown on prior MRI. Mild upper abdominal ascites with some trace edema along the mesentery and along several organs. Suspected gallbladder sludge with borderline distended gallbladder. Electronically Signed   By: Van Clines M.D.   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 Port-A-Cath needed for chemotherapy. EXAM: FLUOROSCOPIC AND ULTRASOUND GUIDED PLACEMENT OF A SUBCUTANEOUS PORT COMPARISON:  None. MEDICATIONS: Ancef 2 g; The antibiotic was administered within an appropriate time interval prior to skin puncture. ANESTHESIA/SEDATION: Versed 4.0 mg IV; Fentanyl 100 mcg IV; Moderate Sedation Time:  32 minutes The patient was continuously monitored during the procedure by the interventional  radiology nurse under my direct supervision. FLUOROSCOPY TIME:  24 seconds, 1 mGy COMPLICATIONS: None immediate. PROCEDURE: The procedure, risks, benefits, and alternatives were explained to the patient. Questions regarding the procedure were encouraged and answered. The patient understands and consents to the procedure. Patient was placed supine on the interventional table. Ultrasound confirmed a patent right internal jugular vein. The right chest and neck were cleaned with a skin antiseptic and a sterile drape was placed. Maximal barrier sterile technique was utilized including caps, mask, sterile gowns, sterile gloves, sterile drape, hand hygiene and skin antiseptic. The right neck was anesthetized with 1% lidocaine. Small incision was made in the right neck with a blade. Micropuncture set was placed in the right internal jugular vein with ultrasound guidance. The micropuncture wire was used for measurement purposes. The right chest was anesthetized with 1% lidocaine with epinephrine. #15 blade was used to make an incision and a subcutaneous port pocket was formed. Harrison was assembled. Subcutaneous tunnel was formed with a stiff tunneling device. The port catheter was brought through the subcutaneous tunnel. The port was placed in the subcutaneous pocket. The micropuncture set was exchanged for a peel-away sheath. The catheter was placed through the peel-away sheath and the tip was positioned at the superior cavoatrial junction. Catheter placement was confirmed with fluoroscopy. The port was accessed and flushed with heparinized saline. The port pocket was closed using two layers of absorbable sutures and Dermabond. The vein skin site was closed using a single layer of absorbable suture and Dermabond. Sterile dressings were applied. Patient tolerated the procedure well without an immediate complication. Ultrasound and fluoroscopic images were taken and saved for this procedure. IMPRESSION: Placement  of a CT injectable Port-A-Cath. Electronically Signed   By: Markus Daft M.D.   On: 11/10/2017 15:09    MRI Lumbar Spine 09/25/17  IMPRESSION:  1. Extensive marrow signal abnormality throughout the osseous structures of the visualized thoracolumbar spine, sacrum, and iliac bones concerning for metastases, multiple myeloma, or other nonspecific marrow infiltrative disorder. Suggest obtaining a PET/CT to evaluate extent of disease/evaluate for a primary tumor.  2. L2 compression fracture, concerning for  pathologic fracture given the extensive marrow abnormality with approximately 40% vertebral body height loss and 8.14m of retropulsion resulting in moderate narrowing of the spinal canal. A postcontrast MRI with fat saturation would help to evaluate the enhancements pattern of the bony disease and evaluate for potential abnormal soft issue extending beyond the cortex which is not definitively evident in this exam.  3. Moderate facet arthrophy with grade 1 anterolisthesis at L4-5.  Degenerative disc disease with moderate left foraminal stenosis at L5-S1.     ASSESSMENT & PLAN:   Haley MAITAis a 67y.o. caucasian female with   1. 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  Liver mets + Bone mets + adrenal mets Pancreatic mets- FNA confirmed MRI brain 10/29/2017 after this clinic visit showed concern for multiple asymptomatic subcentimeter brain mets.  Plan: -Carboplatin/Etoposide/Atezolizumab with neulasta- completed C1 - no prohibitive toxicities from Ctx at this time. Discussed pt labwork today; WBC have responded to the Neulasta and are at 26.2k today. Bilirubin levels have nearly normalized to 1.4  -Continue medications for thrush. (fluconazole) -Continue boost or ensure and increase PO nutrition.  -Asked pt to go to ED  immediately if she develops a fever --Take preventative Diflucan to prevent risk of thrush at next cycle, and to take it on days that she does not receive chemo. -Take Ativan at night prn for anxiety. -Recommend walk around 20-30 minutes each day.  -Will begin C2 on 11/17/17, no prohibitive or apparent toxicities, pt is to continue treatment.  -Repeat Brain MRI in 2 weeks -Handicap parking permit given  2. 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 RHarley Hallmarkwith much improved LFTs -Bx of  pancreatic lesion -consistent with small cell lung cancer.  5. Back pain related to pathologic L2 fracture -continue using back brace -continue prn Oxycodone with GI prophylaxis -will hold of on interventional procedure to L2 at this time. -Per patient's clinical presentation; her hip and leg pain is likely nerve pinching due to compression fractures.  -conitnue Oxycontin, and keeping short term Oxycodone prn. -Opiate related constipation: advised increasing Senna S to twice a day and using Miralax as well; one time OTC Magnesium Citrate if she still cannot move bowels.   5. Smoking history, productive cough  Smoked for 50 years, 1 pack a day  Has not had a Chest Xray or scan since 2008 She currently has moderate cough with clear phlegm ever since episode of bronchitis. She takes uses albuterol inhaler as needed.  Plan: -Smoking cessation  6. 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 RHarley Hallmark Resolving LFts  7. Moderate to sever protein malnutrition due to weight loss -nutritional consultation  --Proceed with C2 of chemotherapy and Neulasta as per orders -continue XMarchelle Folks-RTC with Dr  KIrene Limboon 4/11 or 4/12 with labs -plz schedule C3 of chemotherapy as ordered. -MRI brain in 2 weeks  All of the patients questions were answered with apparent satisfaction. The patient knows to call the clinic with any problems, questions or concerns.  . The total time spent in the appointment was 25 minutes and more  than 50% was on counseling and direct patient cares.    Sullivan Lone MD [place] AAHIVMS [facility]/Oncology Physician [facility]  (Office):       [phone] (Work cell):  [phone] (Fax):           [phone]  11/13/2017 10:03 AM  This document serves as a record of services personally performed by Sullivan Lone, MD. It was created on his behalf by Baldwin Jamaica, a trained medical scribe. The creation of this record is based on the scribe's personal observations and the provider's statements to them.   .I have reviewed the above documentation for accuracy and completeness, and I agree with the above. Brunetta Genera MD MS

## 2017-11-13 ENCOUNTER — Encounter: Payer: Self-pay | Admitting: Hematology

## 2017-11-13 ENCOUNTER — Inpatient Hospital Stay: Payer: No Typology Code available for payment source

## 2017-11-13 ENCOUNTER — Inpatient Hospital Stay (HOSPITAL_BASED_OUTPATIENT_CLINIC_OR_DEPARTMENT_OTHER): Payer: No Typology Code available for payment source | Admitting: Hematology

## 2017-11-13 VITALS — BP 99/61 | HR 85 | Temp 97.8°F | Resp 16

## 2017-11-13 DIAGNOSIS — C3490 Malignant neoplasm of unspecified part of unspecified bronchus or lung: Secondary | ICD-10-CM

## 2017-11-13 DIAGNOSIS — B370 Candidal stomatitis: Secondary | ICD-10-CM

## 2017-11-13 DIAGNOSIS — C7951 Secondary malignant neoplasm of bone: Secondary | ICD-10-CM

## 2017-11-13 DIAGNOSIS — C787 Secondary malignant neoplasm of liver and intrahepatic bile duct: Secondary | ICD-10-CM

## 2017-11-13 DIAGNOSIS — C7931 Secondary malignant neoplasm of brain: Secondary | ICD-10-CM

## 2017-11-13 DIAGNOSIS — D702 Other drug-induced agranulocytosis: Secondary | ICD-10-CM

## 2017-11-13 DIAGNOSIS — C7970 Secondary malignant neoplasm of unspecified adrenal gland: Secondary | ICD-10-CM

## 2017-11-13 DIAGNOSIS — Z5112 Encounter for antineoplastic immunotherapy: Secondary | ICD-10-CM | POA: Diagnosis not present

## 2017-11-13 LAB — CBC WITH DIFFERENTIAL (CANCER CENTER ONLY)
BASOS ABS: 0.1 10*3/uL (ref 0.0–0.1)
BASOS PCT: 0 %
Eosinophils Absolute: 0 10*3/uL (ref 0.0–0.5)
Eosinophils Relative: 0 %
HEMATOCRIT: 29.6 % — AB (ref 34.8–46.6)
Hemoglobin: 9.6 g/dL — ABNORMAL LOW (ref 11.6–15.9)
LYMPHS PCT: 7 %
Lymphs Abs: 1.9 10*3/uL (ref 0.9–3.3)
MCH: 29.4 pg (ref 25.1–34.0)
MCHC: 32.4 g/dL (ref 31.5–36.0)
MCV: 90.8 fL (ref 79.5–101.0)
MONO ABS: 1.3 10*3/uL — AB (ref 0.1–0.9)
MONOS PCT: 5 %
NEUTROS ABS: 22.9 10*3/uL — AB (ref 1.5–6.5)
NEUTROS PCT: 88 %
NRBC: 1 /100{WBCs} — AB
Platelet Count: 110 10*3/uL — ABNORMAL LOW (ref 145–400)
RBC: 3.26 MIL/uL — ABNORMAL LOW (ref 3.70–5.45)
RDW: 14.7 % — ABNORMAL HIGH (ref 11.2–14.5)
WBC Count: 26.2 10*3/uL — ABNORMAL HIGH (ref 3.9–10.3)

## 2017-11-13 LAB — CMP (CANCER CENTER ONLY)
ALBUMIN: 2.9 g/dL — AB (ref 3.5–5.0)
ALT: 24 U/L (ref 0–55)
AST: 25 U/L (ref 5–34)
Alkaline Phosphatase: 362 U/L — ABNORMAL HIGH (ref 40–150)
Anion gap: 10 (ref 3–11)
BUN: 9 mg/dL (ref 7–26)
CHLORIDE: 100 mmol/L (ref 98–109)
CO2: 23 mmol/L (ref 22–29)
CREATININE: 0.66 mg/dL (ref 0.60–1.10)
Calcium: 8.9 mg/dL (ref 8.4–10.4)
GFR, Est AFR Am: >60 mL/min (ref >60)
GFR, Estimated: >60 mL/min (ref >60)
Glucose, Bld: 136 mg/dL (ref 70–140)
POTASSIUM: 3.6 mmol/L (ref 3.5–5.1)
SODIUM: 133 mmol/L — AB (ref 136–145)
Total Bilirubin: 1.4 mg/dL — ABNORMAL HIGH (ref 0.2–1.2)
Total Protein: 5.8 g/dL — ABNORMAL LOW (ref 6.4–8.3)

## 2017-11-13 LAB — RETICULOCYTES
RBC.: 3.26 MIL/uL — AB (ref 3.70–5.45)
RETIC CT PCT: 1.9 % (ref 0.7–2.1)
Retic Count, Absolute: 61.9 10*3/uL (ref 33.7–90.7)

## 2017-11-13 LAB — MAGNESIUM: MAGNESIUM: 2 mg/dL (ref 1.5–2.5)

## 2017-11-13 LAB — PHOSPHORUS: PHOSPHORUS: 2.6 mg/dL (ref 2.5–4.6)

## 2017-11-13 MED ORDER — DEXAMETHASONE 4 MG PO TABS: 4.0000 mg | ORAL_TABLET | Freq: Every day | ORAL | 0 refills | Status: DC

## 2017-11-13 MED ORDER — FLUCONAZOLE 100 MG PO TABS: 100.0000 mg | ORAL_TABLET | Freq: Every day | ORAL | 1 refills | Status: DC

## 2017-11-13 NOTE — Patient Instructions (Signed)
Thank you for choosing [facility] to provide your oncology and hematology care.  To afford each patient quality time with our providers, please arrive 30 minutes before your scheduled appointment time.  If you arrive late for your appointment, you may be asked to reschedule.  We strive to give you quality time with our providers, and arriving late affects you and other patients whose appointments are after yours.   If you are a no show for multiple scheduled visits, you may be dismissed from the clinic at the providers discretion.    Again, thank you for choosing [facility], our hope is that these requests will decrease the amount of time that you wait before being seen by our physicians.  ______________________________________________________________________  Should you have questions after your visit to the [facility], please contact our office at [phone] between the hours of 8:30 and 4:30 p.m.    Voicemails left after 4:30p.m will not be returned until the following business day.    For prescription refill requests, please have your pharmacy contact us directly.  Please also try to allow 48 hours for prescription requests.    Please contact the scheduling department for questions regarding scheduling.  For scheduling of procedures such as PET scans, CT scans, MRI, Ultrasound, etc please contact central scheduling at [phone].    Resources For Cancer Patients and Caregivers:   Oncolink.org:  A wonderful resource for patients and healthcare providers for information regarding your disease, ways to tract your treatment, what to expect, etc.     American Cancer Society:  800-227-2345  Can help patients locate various types of support and financial assistance  Cancer Care: 1-800-813-HOPE (4673) Provides financial assistance, online support groups, medication/co-pay assistance.    [place] DSS:  336-641-3447 Where to apply for food  stamps, Medicaid, and utility assistance  Medicare Rights Center: 800-333-4114 Helps people with Medicare understand their rights and benefits, navigate the Medicare system, and secure the quality healthcare they deserve  SCAT: 336-333-6589 [place] [employer]'s shared-ride transportation service for eligible riders who have a disability that prevents them from riding the fixed route bus.    For additional information on assistance programs please contact our social worker:   Grier Hock/Abigail Elmore:  [phone]            

## 2017-11-14 ENCOUNTER — Telehealth: Payer: Self-pay | Admitting: *Deleted

## 2017-11-14 ENCOUNTER — Telehealth: Payer: Self-pay

## 2017-11-14 NOTE — Telephone Encounter (Signed)
Received fax for disability paper work from [employer].  Patient informed paper work has been given to managed care department and would take about a week to complete.  Patient also informed lab/MD visit have been cancelled on 3/25 since this was completed yesterday, patient informed to come Monday at 12pm for infusion.

## 2017-11-14 NOTE — Telephone Encounter (Signed)
Mailed letter of new appointment. Per 3/21 los

## 2017-11-17 ENCOUNTER — Telehealth: Payer: Self-pay

## 2017-11-17 ENCOUNTER — Other Ambulatory Visit: Payer: 59

## 2017-11-17 ENCOUNTER — Inpatient Hospital Stay: Payer: No Typology Code available for payment source

## 2017-11-17 ENCOUNTER — Ambulatory Visit: Payer: 59 | Admitting: Adult Health

## 2017-11-17 ENCOUNTER — Ambulatory Visit: Payer: 59 | Admitting: Hematology

## 2017-11-17 VITALS — BP 89/41 | HR 81 | Temp 98.0°F | Resp 16

## 2017-11-17 DIAGNOSIS — Z7189 Other specified counseling: Secondary | ICD-10-CM

## 2017-11-17 DIAGNOSIS — C3490 Malignant neoplasm of unspecified part of unspecified bronchus or lung: Secondary | ICD-10-CM

## 2017-11-17 DIAGNOSIS — Z5112 Encounter for antineoplastic immunotherapy: Secondary | ICD-10-CM | POA: Diagnosis not present

## 2017-11-17 MED ORDER — SODIUM CHLORIDE 0.9 % IV SOLN
Freq: Once | INTRAVENOUS | Status: AC
  Administered 2017-11-17: 13:00:00 via INTRAVENOUS

## 2017-11-17 MED ORDER — SODIUM CHLORIDE 0.9 % IV SOLN
Freq: Once | INTRAVENOUS | Status: AC
  Administered 2017-11-17: 13:00:00 via INTRAVENOUS
  Filled 2017-11-17: qty 5

## 2017-11-17 MED ORDER — HEPARIN SOD (PORK) LOCK FLUSH 100 UNIT/ML IV SOLN
500.0000 [IU] | Freq: Once | INTRAVENOUS | Status: AC | PRN
  Administered 2017-11-17: 500 [IU]
  Filled 2017-11-17: qty 5

## 2017-11-17 MED ORDER — ATEZOLIZUMAB CHEMO INJECTION 1200 MG/20ML
1200.0000 mg | Freq: Once | INTRAVENOUS | Status: AC
  Administered 2017-11-17: 1200 mg via INTRAVENOUS
  Filled 2017-11-17: qty 20

## 2017-11-17 MED ORDER — PALONOSETRON HCL INJECTION 0.25 MG/5ML
INTRAVENOUS | Status: AC
  Filled 2017-11-17: qty 5

## 2017-11-17 MED ORDER — SODIUM CHLORIDE 0.9 % IV SOLN
100.0000 mg/m2 | Freq: Once | INTRAVENOUS | Status: AC
  Administered 2017-11-17: 150 mg via INTRAVENOUS
  Filled 2017-11-17: qty 7.5

## 2017-11-17 MED ORDER — SODIUM CHLORIDE 0.9 % IV SOLN
356.5000 mg | Freq: Once | INTRAVENOUS | Status: AC
  Administered 2017-11-17: 360 mg via INTRAVENOUS
  Filled 2017-11-17: qty 36

## 2017-11-17 MED ORDER — SODIUM CHLORIDE 0.9% FLUSH
10.0000 mL | INTRAVENOUS | Status: DC | PRN
  Administered 2017-11-17: 10 mL
  Filled 2017-11-17: qty 10

## 2017-11-17 MED ORDER — PALONOSETRON HCL INJECTION 0.25 MG/5ML
0.2500 mg | Freq: Once | INTRAVENOUS | Status: AC
  Administered 2017-11-17: 0.25 mg via INTRAVENOUS

## 2017-11-17 NOTE — Telephone Encounter (Signed)
Received in basket on 3/25. Patient would need to discuss with RN in infus. Per 3/22 in basket

## 2017-11-17 NOTE — Telephone Encounter (Signed)
Added flush appointment on 4/11 per in basket request 3/20

## 2017-11-17 NOTE — Patient Instructions (Signed)
[place] Discharge Instructions for Patients Receiving Chemotherapy  Today you received the following chemotherapy agents Carboplatin, Etoposide, Tecentriq  To help prevent nausea and vomiting after your treatment, we encourage you to take your nausea medication as directed   If you develop nausea and vomiting that is not controlled by your nausea medication, call the clinic.   BELOW ARE SYMPTOMS THAT SHOULD BE REPORTED IMMEDIATELY:  *FEVER GREATER THAN 100.5 F  *CHILLS WITH OR WITHOUT FEVER  NAUSEA AND VOMITING THAT IS NOT CONTROLLED WITH YOUR NAUSEA MEDICATION  *UNUSUAL SHORTNESS OF BREATH  *UNUSUAL BRUISING OR BLEEDING  TENDERNESS IN MOUTH AND THROAT WITH OR WITHOUT PRESENCE OF ULCERS  *URINARY PROBLEMS  *BOWEL PROBLEMS  UNUSUAL RASH Items with * indicate a potential emergency and should be followed up as soon as possible.  Feel free to call the clinic should you have any questions or concerns. The clinic phone number is [phone].  Please show the [place] at check-in to the Emergency Department and triage nurse.

## 2017-11-18 ENCOUNTER — Inpatient Hospital Stay: Payer: No Typology Code available for payment source

## 2017-11-18 VITALS — BP 107/55 | HR 61 | Temp 97.8°F | Resp 17

## 2017-11-18 DIAGNOSIS — C3490 Malignant neoplasm of unspecified part of unspecified bronchus or lung: Secondary | ICD-10-CM

## 2017-11-18 DIAGNOSIS — Z5112 Encounter for antineoplastic immunotherapy: Secondary | ICD-10-CM | POA: Diagnosis not present

## 2017-11-18 DIAGNOSIS — Z7189 Other specified counseling: Secondary | ICD-10-CM

## 2017-11-18 MED ORDER — SODIUM CHLORIDE 0.9 % IV SOLN: 10.0000 mg | Freq: Once | INTRAVENOUS | Status: DC

## 2017-11-18 MED ORDER — SODIUM CHLORIDE 0.9 % IV SOLN
Freq: Once | INTRAVENOUS | Status: AC
  Administered 2017-11-18: 10:00:00 via INTRAVENOUS

## 2017-11-18 MED ORDER — DEXAMETHASONE SODIUM PHOSPHATE 10 MG/ML IJ SOLN
INTRAMUSCULAR | Status: AC
  Filled 2017-11-18: qty 1

## 2017-11-18 MED ORDER — DEXAMETHASONE SODIUM PHOSPHATE 10 MG/ML IJ SOLN
10.0000 mg | Freq: Once | INTRAMUSCULAR | Status: AC
  Administered 2017-11-18: 10 mg via INTRAVENOUS

## 2017-11-18 MED ORDER — HEPARIN SOD (PORK) LOCK FLUSH 100 UNIT/ML IV SOLN
500.0000 [IU] | Freq: Once | INTRAVENOUS | Status: AC | PRN
  Administered 2017-11-18: 500 [IU]
  Filled 2017-11-18: qty 5

## 2017-11-18 MED ORDER — ETOPOSIDE CHEMO INJECTION 1 GM/50ML
100.0000 mg/m2 | Freq: Once | INTRAVENOUS | Status: AC
  Administered 2017-11-18: 150 mg via INTRAVENOUS
  Filled 2017-11-18: qty 7.5

## 2017-11-18 MED ORDER — SODIUM CHLORIDE 0.9% FLUSH
10.0000 mL | INTRAVENOUS | Status: DC | PRN
  Administered 2017-11-18: 10 mL
  Filled 2017-11-18: qty 10

## 2017-11-18 NOTE — Progress Notes (Signed)
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    Met with patient and sister this am during infusion.  Patient eating cheetos during visit.  Reports she has really been trying to eat more to gain weight.  Reports drinking 2 ensure/boost shakes per day.  Sister is making foods with whole milk, half and half, etc.  Reports that she is trying to eat small frequent meals as well.    Reports episode of thrush that reduced appetite.    Medications: decadron daily  Labs: reviewed  Anthropometrics:  Weight decreased to 101 lb on 3/20 from 112 lb on 3/5.     NUTRITION DIAGNOSIS: Severe malnutrition continues   MALNUTRITION DIAGNOSIS: severe malnutrition continues   INTERVENTION:   Reviewed strategies for increasing calories and protein.   Encouraged 2-3 oral nutrition supplements per day (350 calories).  Patient given 1st case of ensure plus today.    MONITORING, EVALUATION, GOAL: po intake, weight trends   NEXT VISIT: April 16 during infusion  George Alcantar B. Zenia Resides, [place], [place] Registered Dietitian [phone] (pager)

## 2017-11-18 NOTE — Patient Instructions (Signed)
[name] Discharge Instructions for Patients Receiving Chemotherapy  Today you received the following chemotherapy agents:  Etoposide (VP 16)  To help prevent nausea and vomiting after your treatment, we encourage you to take your nausea medication as prescribed.   If you develop nausea and vomiting that is not controlled by your nausea medication, call the clinic.   BELOW ARE SYMPTOMS THAT SHOULD BE REPORTED IMMEDIATELY:  *FEVER GREATER THAN 100.5 F  *CHILLS WITH OR WITHOUT FEVER  NAUSEA AND VOMITING THAT IS NOT CONTROLLED WITH YOUR NAUSEA MEDICATION  *UNUSUAL SHORTNESS OF BREATH  *UNUSUAL BRUISING OR BLEEDING  TENDERNESS IN MOUTH AND THROAT WITH OR WITHOUT PRESENCE OF ULCERS  *URINARY PROBLEMS  *BOWEL PROBLEMS  UNUSUAL RASH Items with * indicate a potential emergency and should be followed up as soon as possible.  Feel free to call the clinic should you have any questions or concerns. The clinic phone number is [phone].  Please show the [place] at check-in to the Emergency Department and triage nurse.

## 2017-11-19 ENCOUNTER — Inpatient Hospital Stay: Payer: No Typology Code available for payment source

## 2017-11-19 VITALS — BP 91/50 | HR 70 | Temp 98.1°F | Resp 18

## 2017-11-19 DIAGNOSIS — Z5112 Encounter for antineoplastic immunotherapy: Secondary | ICD-10-CM | POA: Diagnosis not present

## 2017-11-19 DIAGNOSIS — Z7189 Other specified counseling: Secondary | ICD-10-CM

## 2017-11-19 DIAGNOSIS — C3490 Malignant neoplasm of unspecified part of unspecified bronchus or lung: Secondary | ICD-10-CM

## 2017-11-19 MED ORDER — SODIUM CHLORIDE 0.9 % IV SOLN: 10.0000 mg | Freq: Once | INTRAVENOUS | Status: DC

## 2017-11-19 MED ORDER — DEXAMETHASONE SODIUM PHOSPHATE 10 MG/ML IJ SOLN
10.0000 mg | Freq: Once | INTRAMUSCULAR | Status: AC
  Administered 2017-11-19: 10 mg via INTRAVENOUS

## 2017-11-19 MED ORDER — SODIUM CHLORIDE 0.9 % IV SOLN
Freq: Once | INTRAVENOUS | Status: AC
  Administered 2017-11-19: 10:00:00 via INTRAVENOUS

## 2017-11-19 MED ORDER — SODIUM CHLORIDE 0.9% FLUSH
10.0000 mL | INTRAVENOUS | Status: DC | PRN
  Administered 2017-11-19: 10 mL
  Filled 2017-11-19: qty 10

## 2017-11-19 MED ORDER — ETOPOSIDE CHEMO INJECTION 1 GM/50ML
100.0000 mg/m2 | Freq: Once | INTRAVENOUS | Status: AC
  Administered 2017-11-19: 150 mg via INTRAVENOUS
  Filled 2017-11-19: qty 7.5

## 2017-11-19 MED ORDER — HEPARIN SOD (PORK) LOCK FLUSH 100 UNIT/ML IV SOLN
500.0000 [IU] | Freq: Once | INTRAVENOUS | Status: AC | PRN
  Administered 2017-11-19: 500 [IU]
  Filled 2017-11-19: qty 5

## 2017-11-19 MED ORDER — DEXAMETHASONE SODIUM PHOSPHATE 10 MG/ML IJ SOLN
INTRAMUSCULAR | Status: AC
  Filled 2017-11-19: qty 1

## 2017-11-19 NOTE — Patient Instructions (Signed)
[place] Discharge Instructions for Patients Receiving Chemotherapy  Today you received the following chemotherapy agent:  Etoposide (VP 16)  To help prevent nausea and vomiting after your treatment, we encourage you to take your nausea medication as prescribed.   If you develop nausea and vomiting that is not controlled by your nausea medication, call the clinic.   BELOW ARE SYMPTOMS THAT SHOULD BE REPORTED IMMEDIATELY:  *FEVER GREATER THAN 100.5 F  *CHILLS WITH OR WITHOUT FEVER  NAUSEA AND VOMITING THAT IS NOT CONTROLLED WITH YOUR NAUSEA MEDICATION  *UNUSUAL SHORTNESS OF BREATH  *UNUSUAL BRUISING OR BLEEDING  TENDERNESS IN MOUTH AND THROAT WITH OR WITHOUT PRESENCE OF ULCERS  *URINARY PROBLEMS  *BOWEL PROBLEMS  UNUSUAL RASH Items with * indicate a potential emergency and should be followed up as soon as possible.  Feel free to call the clinic should you have any questions or concerns. The clinic phone number is [phone].  Please show the [place] at check-in to the Emergency Department and triage nurse.

## 2017-11-20 ENCOUNTER — Telehealth: Payer: Self-pay | Admitting: Hematology

## 2017-11-20 NOTE — Telephone Encounter (Signed)
11/20/17 @ 2:22 pm, left voicemail informing patient Disability forms have been successfully faxed to [employer] at [phone]. Also mailed a copy to patient.

## 2017-11-21 ENCOUNTER — Telehealth: Payer: Self-pay

## 2017-11-21 ENCOUNTER — Inpatient Hospital Stay: Payer: No Typology Code available for payment source

## 2017-11-21 DIAGNOSIS — Z7189 Other specified counseling: Secondary | ICD-10-CM

## 2017-11-21 DIAGNOSIS — C3490 Malignant neoplasm of unspecified part of unspecified bronchus or lung: Secondary | ICD-10-CM

## 2017-11-21 DIAGNOSIS — Z5112 Encounter for antineoplastic immunotherapy: Secondary | ICD-10-CM | POA: Diagnosis not present

## 2017-11-21 MED ORDER — PEGFILGRASTIM-CBQV 6 MG/0.6ML ~~LOC~~ SOSY
PREFILLED_SYRINGE | SUBCUTANEOUS | Status: AC
  Filled 2017-11-21: qty 0.6

## 2017-11-21 MED ORDER — PEGFILGRASTIM-CBQV 6 MG/0.6ML ~~LOC~~ SOSY
6.0000 mg | PREFILLED_SYRINGE | Freq: Once | SUBCUTANEOUS | Status: AC
  Administered 2017-11-21: 6 mg via SUBCUTANEOUS

## 2017-11-21 NOTE — Patient Instructions (Signed)
Pegfilgrastim injection What is this medicine? PEGFILGRASTIM (PEG fil gra stim) is a long-acting granulocyte colony-stimulating factor that stimulates the growth of neutrophils, a type of white blood cell important in the body's fight against infection. It is used to reduce the incidence of fever and infection in patients with certain types of cancer who are receiving chemotherapy that affects the bone marrow, and to increase survival after being exposed to high doses of radiation. This medicine may be used for other purposes; ask your health care provider or pharmacist if you have questions. COMMON BRAND NAME(S): Neulasta What should I tell my health care provider before I take this medicine? They need to know if you have any of these conditions: -kidney disease -latex allergy -ongoing radiation therapy -sickle cell disease -skin reactions to acrylic adhesives (On-Body Injector only) -an unusual or allergic reaction to pegfilgrastim, filgrastim, other medicines, foods, dyes, or preservatives -pregnant or trying to get pregnant -breast-feeding How should I use this medicine? This medicine is for injection under the skin. If you get this medicine at home, you will be taught how to prepare and give the pre-filled syringe or how to use the On-body Injector. Refer to the patient Instructions for Use for detailed instructions. Use exactly as directed. Tell your healthcare provider immediately if you suspect that the On-body Injector may not have performed as intended or if you suspect the use of the On-body Injector resulted in a missed or partial dose. It is important that you put your used needles and syringes in a special sharps container. Do not put them in a trash can. If you do not have a sharps container, call your pharmacist or healthcare provider to get one. Talk to your pediatrician regarding the use of this medicine in children. While this drug may be prescribed for selected conditions,  precautions do apply. Overdosage: If you think you have taken too much of this medicine contact a poison control center or emergency room at once. NOTE: This medicine is only for you. Do not share this medicine with others. What if I miss a dose? It is important not to miss your dose. Call your doctor or health care professional if you miss your dose. If you miss a dose due to an On-body Injector failure or leakage, a new dose should be administered as soon as possible using a single prefilled syringe for manual use. What may interact with this medicine? Interactions have not been studied. Give your health care provider a list of all the medicines, herbs, non-prescription drugs, or dietary supplements you use. Also tell them if you smoke, drink alcohol, or use illegal drugs. Some items may interact with your medicine. This list may not describe all possible interactions. Give your health care provider a list of all the medicines, herbs, non-prescription drugs, or dietary supplements you use. Also tell them if you smoke, drink alcohol, or use illegal drugs. Some items may interact with your medicine. What should I watch for while using this medicine? You may need blood work done while you are taking this medicine. If you are going to need a MRI, CT scan, or other procedure, tell your doctor that you are using this medicine (On-Body Injector only). What side effects may I notice from receiving this medicine? Side effects that you should report to your doctor or health care professional as soon as possible: -allergic reactions like skin rash, itching or hives, swelling of the face, lips, or tongue -dizziness -fever -pain, redness, or irritation at site   where injected -pinpoint red spots on the skin -red or dark-brown urine -shortness of breath or breathing problems -stomach or side pain, or pain at the shoulder -swelling -tiredness -trouble passing urine or change in the amount of urine Side  effects that usually do not require medical attention (report to your doctor or health care professional if they continue or are bothersome): -bone pain -muscle pain This list may not describe all possible side effects. Call your doctor for medical advice about side effects. You may report side effects to FDA at 1-800-FDA-1088. Where should I keep my medicine? Keep out of the reach of children. Store pre-filled syringes in a refrigerator between 2 and 8 degrees C (36 and 46 degrees F). Do not freeze. Keep in carton to protect from light. Throw away this medicine if it is left out of the refrigerator for more than 48 hours. Throw away any unused medicine after the expiration date. NOTE: This sheet is a summary. It may not cover all possible information. If you have questions about this medicine, talk to your doctor, pharmacist, or health care provider.  2018 Elsevier/Gold Standard (2016-08-08 12:58:03)  

## 2017-11-21 NOTE — Telephone Encounter (Signed)
Pt received udenyca injection today. Haley Cornea, LPN recommended pt to take claritin OTC for bone pain, potential side effect of injection. Pt wanted confirmation from desk RN or Dr. Irene Limbo that this was okay to take. Explained that this is recommended for treatment of bone pain caused by udenyca or neulasta injections that boost white blood cells. Pt verbalized understanding.

## 2017-11-28 ENCOUNTER — Ambulatory Visit (HOSPITAL_COMMUNITY): Payer: 59

## 2017-11-28 ENCOUNTER — Ambulatory Visit (HOSPITAL_COMMUNITY)
Admission: RE | Admit: 2017-11-28 | Discharge: 2017-11-28 | Disposition: A | Discharge status: Home / Self Care | Payer: 59 | Source: Ambulatory Visit | Attending: Hematology | Admitting: Hematology

## 2017-11-28 DIAGNOSIS — C3490 Malignant neoplasm of unspecified part of unspecified bronchus or lung: Secondary | ICD-10-CM | POA: Diagnosis present

## 2017-11-28 DIAGNOSIS — C7951 Secondary malignant neoplasm of bone: Secondary | ICD-10-CM | POA: Insufficient documentation

## 2017-11-28 MED ORDER — GADOBENATE DIMEGLUMINE 529 MG/ML IV SOLN
10.0000 mL | Freq: Once | INTRAVENOUS | Status: AC | PRN
  Administered 2017-11-28: 10 mL via INTRAVENOUS

## 2017-12-03 NOTE — Progress Notes (Signed)
HEMATOLOGY/ONCOLOGY CLINIC NOTE  Date of Service: 12/04/17  Patient Care Team: [facility], Modena Nunnery, MD as PCP - General (Family Medicine)  CHIEF COMPLAINTS/PURPOSE OF CONSULTATION:   F/u for continued management of extensive stage small cell lung cancer.  HISTORY OF PRESENTING ILLNESS:   Haley Haley [name] is Haley [name] who has been referred to [place] by Orthopedists Dr. Vickki Hearing for evaluation and management of Bone Marrow Abnormality. Her PCP is Dr. Buelah Manis.   She presents to the clinic today accompanied by her sons and her daughter-in-law. She notes she has several issues since October 2018.  She had colitis in 05/2017 with significant diarrhea and abdominal cramping --now mostly resolved.  She was treated with antibiotics. Afterward she had bronchitis. She was given albuterol and continues to take it as she has remaining cough (smoking contributed to this).   On 08/07/17 she notes she has back pain in her lower back the past couple of months. 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 Next day she saw her physician, who had done Haley [name] xray and told her she had arthritis. She was treated with prednisone and gabapentin. After no back pain improvements on treatment, she has yet to return to work. She went to orthopedist and had Haley [name] Lumbar MRI on 09/25/17. This shows Haley [name] compression fracture of the L2 with abnormalities in her bone marrow.   She quit smoking 09/24/17. She had been smoking 1 pack Haley [name] day for 50 years, since she was 67 yo. She still has cough from smoking but not as significant.   She denies heart conditions or disease, GI issues or diseases or other significant medical issues. DM runs in her family but no known cancers, blood disorders, or gene mutations. Prior to her health in the past 6 months she has lead Haley [name] independent overall healthy life. Her last mammogram in 04/2017 was negative. Last colonoscopy in 2015 and last [place] in 2017, were negative.  She had Haley [name] bone density scan in 2009. She has never been on HR therapy.   Given her significant pain (up to 10/10). She currently takes hydrocodone 1 tablet q4-5hours. This only reduced her pain to 7-8/10. She takes Tizanidine 19m q5-6hours. She has Haley [name] back brace that she does not use.   On review of symptoms, pt notes ongoing b/l hip pain, lower back pain which can radiate up the side and down her legs and buttocks. The pain can keep her up at night. She notes when she lays on her right side, her throat pain which causes her to cough. This started around time of bronchitis and has not improved. When she does cough she produces clear phlegm.    Interval History:   Haley ZACHERYreturns today regarding her extensive stage metastatic small cell lung cancer.The patient's last visit with [place] on 11/13/17. She is accompanied today by her sons. The pt reports that she is doing well overall.   The pt reports that she has gained weight and is feeling very well. She notes that she has persisting back pain, but describes it as tolerable. She is using one Oxycontin each day with relief. She notes that she has no new bone pains. She notes that she is able to walk around with necessary breaks to catch her breath. She denies SOB when sitting.   Of note since the patient's last visit, pt has had MRI Brain completed on 11/28/17 with results revealing 1. New nonenhancing  subcentimeter supra-and infratentorial intraparenchymal metastasis. 2. Stable number of enhancing subcentimeter intraparenchymal metastasis, which vary from stable to decreased in size. 3. Multiple osseous metastasis.  Lab results today (12/04/17) of CBC, CMP, and Reticulocytes is as follows: all values are WNL except for WBC at 25.7k, RBC at 2.81, Hgb at 8.7, HCT at 27.5, RDW at 22.2, Neutro Abs at 21.4k, Monocytes Abs at 1.8k, Basophils Abs at 0.3k, Sodium at 134, CO2 at 21, Creatinine at 0.57, Calcium at 8.3, Total Protein at 5.9, Albumin at 3.2,  AST at 44, ALT at 64, Alk Phos at 722, Retic Ct Pct at 7.3%, Retic Ct Abs at 205.1. Magnesium 12/04/17 is WNL at 2.3.  On review of systems, pt reports gained weight, stable back pain, resolved constipation, sleeping well, and denies new bone pains, headaches, changes in speech, changes in vision, nausea, vomiting, occasional chills, fatigue, abdominal pain, and any other symptoms.    MEDICAL HISTORY:  Past Medical History:  Diagnosis Date  . Allergy   . Osteopenia     SURGICAL HISTORY: Past Surgical History:  Procedure Laterality Date  . COLONOSCOPY    . COLONOSCOPY WITH PROPOFOL N/Haley [name] 10/25/2014   Procedure: COLONOSCOPY WITH PROPOFOL;  Surgeon: Garlan Fair, MD;  Location: [facility];  Service: Endoscopy;  Laterality: N/Haley [name];  . ERCP N/Haley [name] 10/15/2017   Procedure: ENDOSCOPIC RETROGRADE CHOLANGIOPANCREATOGRAPHY (ERCP);  Surgeon: Clarene Essex, MD;  Location: [facility];  Service: Endoscopy;  Laterality: N/Haley [name];  . IR FLUORO GUIDE PORT INSERTION RIGHT  11/10/2017  . IR RADIOLOGIST EVAL & MGMT  10/02/2017  . IR US GUIDE VASC ACCESS RIGHT  11/10/2017    SOCIAL HISTORY: Social History   Socioeconomic History  . Marital status: Single    Spouse name: Not on file  . Number of children: Not on file  . Years of education: Not on file  . Highest education level: Not on file  Occupational History  . Not on file  Social Needs  . Financial resource strain: Not on file  . Food insecurity:    Worry: Not on file    Inability: Not on file  . Transportation needs:    Medical: Not on file    Non-medical: Not on file  Tobacco Use  . Smoking status: Former Smoker    Packs/day: 1.00    Years: 40.00    Pack years: 40.00    Types: Cigarettes    Last attempt to quit: 09/24/2017    Years since quitting: 0.1  . Smokeless tobacco: Never Used  Substance and Sexual Activity  . Alcohol use: Yes    Comment: rare social  . Drug use: No  . Sexual activity: Not Currently  Lifestyle  . Physical activity:     Days per week: Not on file    Minutes per session: Not on file  . Stress: Not on file  Relationships  . Social connections:    Talks on phone: Not on file    Gets together: Not on file    Attends religious service: Not on file    Active member of club or organization: Not on file    Attends meetings of clubs or organizations: Not on file    Relationship status: Not on file  . Intimate partner violence:    Fear of current or ex partner: Not on file    Emotionally abused: Not on file    Physically abused: Not on file    Forced sexual activity: Not on file  Other Topics Concern  .  Not on file  Social History Narrative  . Not on file    FAMILY HISTORY: Family History  Problem Relation Age of Onset  . Diabetes Mother   . Hypertension Mother   . Diabetes Sister   . Hypertension Sister   . Hyperlipidemia Sister     ALLERGIES:  has No Known Allergies.  MEDICATIONS:  Current Outpatient Medications  Medication Sig Dispense Refill  . dexamethasone (DECADRON) 4 MG tablet Take 1 tablet (4 mg total) by mouth daily with breakfast. 20 tablet 0  . fluconazole (DIFLUCAN) 100 MG tablet Take 1 tablet (100 mg total) by mouth daily. On all days except for 5 days during chemotherapy and until neulasta shot. 30 tablet 1  . lidocaine-prilocaine (EMLA) cream Apply to affected area once 30 g 3  . LORazepam (ATIVAN) 0.5 MG tablet Take 1 tablet (0.5 mg total) by mouth every 6 (six) hours as needed (Nausea or vomiting). 30 tablet 0  . magic mouthwash w/lidocaine SOLN Take 10 mLs by mouth 4 (four) times daily as needed for mouth pain. Rinse and spit or swallow 240 mL 2  . ondansetron (ZOFRAN) 4 MG tablet Take 1 tablet (4 mg total) by mouth every 8 (eight) hours as needed for nausea or vomiting. 20 tablet 0  . oxyCODONE (OXY IR/ROXICODONE) 5 MG immediate release tablet Take 1-2 tablets (5-10 mg total) by mouth every 4 (four) hours as needed for severe pain. 60 tablet 0  . oxyCODONE (OXYCONTIN) 15 mg  12 hr tablet Take 1 tablet (15 mg total) by mouth every 12 (twelve) hours. 60 tablet 0  . polyethylene glycol (MIRALAX) packet Take 17 g by mouth daily as needed for moderate constipation. 30 each 0  . prochlorperazine (COMPAZINE) 10 MG tablet Take 1 tablet (10 mg total) by mouth every 6 (six) hours as needed (Nausea or vomiting). 30 tablet 1  . senna-docusate (SENNA S) 8.6-50 MG tablet Take 2 tablets by mouth at bedtime. 60 tablet 1  . tiZANidine (ZANAFLEX) 4 MG tablet Take 1 tablet (4 mg total) by mouth every 8 (eight) hours as needed for muscle spasms. 30 tablet 0   No current facility-administered medications for this visit.     REVIEW OF SYSTEMS:    .10 Point review of Systems was done is negative except as noted above.  PHYSICAL EXAMINATION: ECOG PERFORMANCE STATUS: 2 - Symptomatic, <50% confined to bed  . Vitals:   12/04/17 1135  BP: 101/61  Pulse: 85  Resp: 18  Temp: 98.5 F (36.9 C)  SpO2: 100%   Filed Weights   12/04/17 1135  Weight: 115 lb 4.8 oz (52.3 kg)   .Body mass index is 20.42 kg/m. Marland Kitchen GENERAL:alert, in no acute distress and comfortable SKIN: no acute rashes, no significant lesions EYES: conjunctiva are pink and non-injected, sclera anicteric OROPHARYNX: MMM, no exudates, no oropharyngeal erythema or ulceration NECK: supple, no JVD LYMPH:  no palpable lymphadenopathy in the cervical, axillary or inguinal regions LUNGS: clear to auscultation b/l with normal respiratory effort HEART: regular rate & rhythm ABDOMEN:  normoactive bowel sounds , non tender, not distended. Extremity: no pedal edema PSYCH: alert & oriented x 3 with fluent speech NEURO: no focal motor/sensory deficits   LABORATORY DATA:   . CBC Latest Ref Rng & Units 12/20/2017 12/19/2017 12/04/2017  WBC 4.0 - 10.5 K/uL 20.3(H) 17.6(H) 25.7(H)  Hemoglobin 12.0 - 15.0 g/dL 10.4(L) 10.7(L) 8.7(L)  Hematocrit 36.0 - 46.0 % 32.9(L) 33.3(L) 27.5(L)  Platelets 150 - 400 K/uL 176  181 207  HGB  9.6  . CMP Latest Ref Rng & Units 12/04/2017 11/13/2017 11/05/2017  Glucose 70 - 140 mg/dL 101 136 122  BUN 7 - 26 mg/dL 14 9 20   Creatinine 0.60 - 1.10 mg/dL 0.57(L) 0.66 0.75  Sodium 136 - 145 mmol/L 134(L) 133(L) 139  Potassium 3.5 - 5.1 mmol/L 4.7 3.6 5.1  Chloride 98 - 109 mmol/L 105 100 106  CO2 22 - 29 mmol/L 21(L) 23 26  Calcium 8.4 - 10.4 mg/dL 8.3(L) 8.9 9.6  Total Protein 6.4 - 8.3 g/dL 5.9(L) 5.8(L) 6.8  Total Bilirubin 0.2 - 1.2 mg/dL 0.6 1.4(H) 2.2(H)  Alkaline Phos 40 - 150 U/L 722(H) 362(H) 205(H)  AST 5 - 34 U/L 44(H) 25 81(H)  ALT 0 - 55 U/L 64(H) 24 55     10/17/17 Liver needle/core biopsy   Fine Needle Aspiration, EUS, Pancreas3/12/2017 [facility] Result Narrative  ACCESSION NUMBER: [id number] RECEIVED: 10/24/2017 ORDERING PHYSICIAN: Harley Hallmark , MD PATIENT NAME: Haley Haley [name] CYTOLOGY REPORT  Final Cytologic Interpretation  Pancreas, endoscopic ultrasound-guided, fine needle aspiration II (smears and cell block): 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  Specimen Adequacy:Satisfactory for evaluation.    COMMENT:The smears show clusters of neoplastic cells with neuroendocrine nuclear features and scant cytoplasm.  Immunoperoxidase studies on limited cell block material show the neoplastic cells stain positive for synaptophysin, CD56, cytokeratin AE1/AE3 (Golgi-dot like staining) and TTF-1. The MIB-1 shows Haley [name] high proliferation index with positive staining in approximately 80% of the cells. Together with the cytomorphology this supports the diagnosis of small cell carcinoma. Given the presence of Haley [name] suspected right infrahilar lung mass on MRI per [facility] and Haley [name] pancreatic mass, I cannot be certain of the site of origin and whether this represents Haley [name] primary pancreatic small cell poorly differentiated endocrine carcinoma versus Haley [name] metastatic small cell carcinoma of lung origin (both can  express TTF-1). Clinical correlation advised.  Dr. Romilda Garret has reviewed the case in consultation and agrees with the diagnosis of small cell carcinoma.   The positive controls worked appropriately   "These tests were developed and their performance characteristics determined by [facility].They have not been cleared or approved by the U.S. Food and Drug Administration.The FDA has determined that such clearance or approval is not necessary.These tests are used for clinical purposes.They should not be regarded as investigational or for research.This laboratory is certified under the [place] (CLIA) as qualified to perform high complexity clinical laboratory testing."   Report Prepared By:O.Haley [name]. Sheryle Hail, M.D.  I have personally reviewed the slides and/or other related materials referenced, and have edited the report as part of my pathologic assessment and final interpretation.  Electronically Signed Out By: Blythe Stanford , MD 10/28/2017 20:55:03  sb/oah  Specimen(s) Received:  Pancreas, endoscopic ultrasound-guided, fine needle aspiration II (smears and cell block)  Clinical History obstructive jaundice  Preliminary (on-site) Interpretation  Adequate. L.Cox CT(ASCP) 10/24/17     RADIOGRAPHIC STUDIES: I have personally reviewed the radiological images as listed and agreed with the findings in the report. Mr Jeri Cos Wo Contrast  Result Date: 11/28/2017 CLINICAL DATA:  Metastatic lung cancer.  Follow-up after treatment. EXAM: MRI HEAD WITHOUT AND WITH CONTRAST TECHNIQUE: Multiplanar, multiecho pulse sequences of the brain and surrounding structures were obtained without and with intravenous contrast. CONTRAST:  70m MULTIHANCE GADOBENATE DIMEGLUMINE 529 MG/ML IV SOLN COMPARISON:  MRI of the head October 29, 2017 FINDINGS: INTRACRANIAL  CONTENTS: Interval increase in number of  nonenhancing metastasis measuring to 11 mm including LEFT temporal cortex (series 7, image 12), LEFT posterior temporal lobe (series 7, image 13), subcentimeter bilateral cerebella, RIGHT occipital cortex (series 7, image 18), LEFT occipital lobe (series 7, image 12) and LEFT posterior frontal lobe cortex (series 7, image 20). 6 mm LEFT temporal periventricular white matter lesion was 5 mm. Metastasis demonstrate reduced diffusion compatible with hypercellular tumor. LEFT frontal developmental venous anomaly. Stable number of enhancing intracranial metastasis (some of which demonstrate T1 shortening), some are stable and some are smaller. No midline shift or mass effect. No parenchymal brain volume loss for age. No susceptibility artifact to suggest hemorrhage. No abnormal extra-axial fluid collections or enhancement. VASCULAR: Normal major intracranial vascular flow voids present at skull base. SKULL AND UPPER CERVICAL SPINE: No abnormal sellar expansion. Heterogeneous hypo bow intense T1 calvarial and cervical spine signal consistent with known metastasis. Craniocervical junction maintained. SINUSES/ORBITS: The mastoid air-cells and included paranasal sinuses are well-aerated.The included ocular globes and orbital contents are non-suspicious. OTHER: None. IMPRESSION: 1. New nonenhancing subcentimeter supra-and infratentorial intraparenchymal metastasis. 2. Stable number of enhancing subcentimeter intraparenchymal metastasis, which vary from stable to decreased in size. 3. Multiple osseous metastasis. Electronically Signed   By: Elon Alas M.D.   On: 11/28/2017 17:20   Ir US Guide Vasc Access Right  Result Date: 11/10/2017 INDICATION: 67 year old with extensive stage primary small cell carcinoma of lung. Port-Haley [name]-Cath needed for chemotherapy. EXAM: FLUOROSCOPIC AND ULTRASOUND GUIDED PLACEMENT OF Haley [name] SUBCUTANEOUS PORT COMPARISON:  None. MEDICATIONS: Ancef 2 g; The antibiotic was administered within an  appropriate time interval prior to skin puncture. ANESTHESIA/SEDATION: Versed 4.0 mg IV; Fentanyl 100 mcg IV; Moderate Sedation Time:  32 minutes The patient was continuously monitored during the procedure by the interventional radiology nurse under my direct supervision. FLUOROSCOPY TIME:  24 seconds, 1 mGy COMPLICATIONS: None immediate. PROCEDURE: The procedure, risks, benefits, and alternatives were explained to the patient. Questions regarding the procedure were encouraged and answered. The patient understands and consents to the procedure. Patient was placed supine on the interventional table. Ultrasound confirmed Haley [name] patent right internal jugular vein. The right chest and neck were cleaned with Haley [name] skin antiseptic and Haley [name] sterile drape was placed. Maximal barrier sterile technique was utilized including caps, mask, sterile gowns, sterile gloves, sterile drape, hand hygiene and skin antiseptic. The right neck was anesthetized with 1% lidocaine. Small incision was made in the right neck with Haley [name] blade. Micropuncture set was placed in the right internal jugular vein with ultrasound guidance. The micropuncture wire was used for measurement purposes. The right chest was anesthetized with 1% lidocaine with epinephrine. #15 blade was used to make an incision and Haley [name] subcutaneous port pocket was formed. [place] was assembled. Subcutaneous tunnel was formed with Haley [name] stiff tunneling device. The port catheter was brought through the subcutaneous tunnel. The port was placed in the subcutaneous pocket. The micropuncture set was exchanged for Haley [name] peel-away sheath. The catheter was placed through the peel-away sheath and the tip was positioned at the superior cavoatrial junction. Catheter placement was confirmed with fluoroscopy. The port was accessed and flushed with heparinized saline. The port pocket was closed using two layers of absorbable sutures and Dermabond. The vein skin site was closed using Haley [name] single layer of  absorbable suture and Dermabond. Sterile dressings were applied. Patient tolerated the procedure well without an immediate complication. Ultrasound and fluoroscopic images were taken and saved for this procedure. IMPRESSION: Placement  of Haley [name] CT injectable Port-Haley [name]-Cath. Electronically Signed   By: Markus Daft M.D.   On: 11/10/2017 15:09   Ir Fluoro Guide Port Insertion Right  Result Date: 11/10/2017 INDICATION: 67 year old with extensive stage primary small cell carcinoma of lung. Port-Haley [name]-Cath needed for chemotherapy. EXAM: FLUOROSCOPIC AND ULTRASOUND GUIDED PLACEMENT OF Haley [name] SUBCUTANEOUS PORT COMPARISON:  None. MEDICATIONS: Ancef 2 g; The antibiotic was administered within an appropriate time interval prior to skin puncture. ANESTHESIA/SEDATION: Versed 4.0 mg IV; Fentanyl 100 mcg IV; Moderate Sedation Time:  32 minutes The patient was continuously monitored during the procedure by the interventional radiology nurse under my direct supervision. FLUOROSCOPY TIME:  24 seconds, 1 mGy COMPLICATIONS: None immediate. PROCEDURE: The procedure, risks, benefits, and alternatives were explained to the patient. Questions regarding the procedure were encouraged and answered. The patient understands and consents to the procedure. Patient was placed supine on the interventional table. Ultrasound confirmed Haley [name] patent right internal jugular vein. The right chest and neck were cleaned with Haley [name] skin antiseptic and Haley [name] sterile drape was placed. Maximal barrier sterile technique was utilized including caps, mask, sterile gowns, sterile gloves, sterile drape, hand hygiene and skin antiseptic. The right neck was anesthetized with 1% lidocaine. Small incision was made in the right neck with Haley [name] blade. Micropuncture set was placed in the right internal jugular vein with ultrasound guidance. The micropuncture wire was used for measurement purposes. The right chest was anesthetized with 1% lidocaine with epinephrine. #15 blade was used to make an  incision and Haley [name] subcutaneous port pocket was formed. [place] was assembled. Subcutaneous tunnel was formed with Haley [name] stiff tunneling device. The port catheter was brought through the subcutaneous tunnel. The port was placed in the subcutaneous pocket. The micropuncture set was exchanged for Haley [name] peel-away sheath. The catheter was placed through the peel-away sheath and the tip was positioned at the superior cavoatrial junction. Catheter placement was confirmed with fluoroscopy. The port was accessed and flushed with heparinized saline. The port pocket was closed using two layers of absorbable sutures and Dermabond. The vein skin site was closed using Haley [name] single layer of absorbable suture and Dermabond. Sterile dressings were applied. Patient tolerated the procedure well without an immediate complication. Ultrasound and fluoroscopic images were taken and saved for this procedure. IMPRESSION: Placement of Haley [name] CT injectable Port-Haley [name]-Cath. Electronically Signed   By: Markus Daft M.D.   On: 11/10/2017 15:09    MRI Lumbar Spine 09/25/17  IMPRESSION:  1. Extensive marrow signal abnormality throughout the osseous structures of the visualized thoracolumbar spine, sacrum, and iliac bones concerning for metastases, multiple myeloma, or other nonspecific marrow infiltrative disorder. Suggest obtaining Haley [name] PET/CT to evaluate extent of disease/evaluate for Haley [name] primary tumor.  2. L2 compression fracture, concerning for pathologic fracture given the extensive marrow abnormality with approximately 40% vertebral body height loss and 8.21m of retropulsion resulting in moderate narrowing of the spinal canal. Haley [name] postcontrast MRI with fat saturation would help to evaluate the enhancements pattern of the bony disease and evaluate for potential abnormal soft issue extending beyond the cortex which is not definitively evident in this exam.  3. Moderate facet arthrophy with grade 1 anterolisthesis at L4-5.  Degenerative disc disease with  moderate left foraminal stenosis at L5-S1.     ASSESSMENT & PLAN:   SAIREONA TORELLIis Haley [name] 67y.o. caucasian female with   1. Recently diagnosed metastatic Extensive stage small cell lung cancer  10/17/17 Liver needle/core biopsy revealing small cell carcinoma likely of lung primary;  and diagnosis of Extensive Stage Small Cell Carcinoma.  Liver mets + Bone mets + adrenal mets Pancreatic mets- FNA confirmed MRI brain 10/29/2017 after this clinic visit showed concern for multiple asymptomatic subcentimeter brain mets.  Plan: -Discussed pt labwork today 12/04/17; Hgb at 8.7, will transfuse if Hgb <8 or significant fatigue.  -Discussed 11/28/17 Brain MRI which revealed stable lesions, and new nonenhancing lesions. I discussed this imaging study with Dr Ulis Rias our neuro-oncologist -- no area of critical brain mets that needs patient to rush into WBRT at this time. -Will refer pt to rad onc today for future planning for brain RT hopefully after c4 to achieve adequate systemic disease control. -Will repeat PET and MRI before C4 -Pt will continue with C3 and has no prohibitive toxicities from her treatment.   -Carboplatin/Etoposide/Atezolizumab with neulasta support -Continue medications for thrush. (fluconazole) - to prevent recurrence of signiificant thrush. -Continue boost or ensure and increase PO nutrition.  -Take preventative Diflucan to prevent risk of thrush at next cycle, and to take it on days that she does not receive chemo. -Take Ativan at night prn for anxiety. -Recommend walk around 20-30 minutes each day.   2. Hyponatremia - poor po intake vs SIADH (related to pain/lung/brain involvement) -recommended patient consume more salty foods  3.s/p New onset obstructive Jaundice due to biliary compression from pancreatic mass  PLAN:  -ERCP attempted by Dr Watt Climes - not successful -ERCP completed on 10/24/17 with Dr. Harley Hallmark with much improved LFTs -Bx of  pancreatic lesion -consistent with  small cell lung cancer.  5. Back pain related to pathologic L2 fracture (causing elevated alkaline phosphatase levels. -continue using back brace -continue prn Oxycodone with GI prophylaxis -will hold of on interventional procedure to L2 at this time. -Per patient's clinical presentation; her hip and leg pain is likely nerve pinching due to compression fractures.  -conitnue Oxycontin, and keeping short term Oxycodone prn. -Opiate related constipation: advised increasing Senna S to twice Haley [name] day and using Miralax as well; one time OTC Magnesium Citrate if she still cannot move bowels.   5. Smoking history, productive cough  Smoked for 50 years, 1 pack Haley [name] day  Plan: -Smoking cessation  6. 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 Harley Hallmark  Resolved hyperbilirubinemia  7. Moderate to severe protein malnutrition due to weight loss -nutritional consultation  -May cancel lab and clinic appointment (With Wilber Bihari) on 4/15 -Proceed with C3 of chemotherapy as per schedule -Radiation oncology referral for extensive stage small cell lung cancer -MRI brain in 3 weeks (before C4) -PET/CT in 3weeks (before C4) -RTC for C4 with labs and clinic visit as scheduled with Dr Irene Limbo   All of the patients questions were answered with apparent satisfaction. The patient knows to call the clinic with any problems, questions or concerns.  . The total time spent in the appointment was 30 minutes and more than 50% was on counseling and direct patient cares nd case discussion with neuro-oncology.   Sullivan Lone MD [place] AAHIVMS [facility] Hematology/Oncology Physician [facility]  (Office):       [phone] (Work cell):  [phone] (Fax):           [phone]  12/04/2017 12:24 PM  This document serves as [name] record of services personally performed by Sullivan Lone, MD. It was created on his behalf by Baldwin [place], Haley [name] trained medical scribe.  The creation of this record is based on the scribe's  personal observations and the provider's statements to them.   .I have reviewed the above documentation for accuracy and completeness, and I agree with the above. Brunetta Genera MD MS

## 2017-12-04 ENCOUNTER — Inpatient Hospital Stay: Payer: No Typology Code available for payment source | Attending: Hematology | Admitting: Hematology

## 2017-12-04 ENCOUNTER — Inpatient Hospital Stay: Payer: No Typology Code available for payment source

## 2017-12-04 ENCOUNTER — Encounter: Payer: Self-pay | Admitting: Hematology

## 2017-12-04 ENCOUNTER — Telehealth: Payer: Self-pay

## 2017-12-04 ENCOUNTER — Telehealth: Payer: Self-pay | Admitting: Hematology

## 2017-12-04 ENCOUNTER — Other Ambulatory Visit: Payer: Self-pay | Admitting: *Deleted

## 2017-12-04 VITALS — BP 101/61 | HR 85 | Temp 98.5°F | Resp 18 | Ht 63.0 in | Wt 115.3 lb

## 2017-12-04 DIAGNOSIS — K831 Obstruction of bile duct: Secondary | ICD-10-CM | POA: Diagnosis not present

## 2017-12-04 DIAGNOSIS — C7931 Secondary malignant neoplasm of brain: Secondary | ICD-10-CM | POA: Insufficient documentation

## 2017-12-04 DIAGNOSIS — Z7189 Other specified counseling: Secondary | ICD-10-CM

## 2017-12-04 DIAGNOSIS — Z87891 Personal history of nicotine dependence: Secondary | ICD-10-CM | POA: Insufficient documentation

## 2017-12-04 DIAGNOSIS — Z79899 Other long term (current) drug therapy: Secondary | ICD-10-CM | POA: Insufficient documentation

## 2017-12-04 DIAGNOSIS — C3490 Malignant neoplasm of unspecified part of unspecified bronchus or lung: Secondary | ICD-10-CM

## 2017-12-04 DIAGNOSIS — Z87311 Personal history of (healed) other pathological fracture: Secondary | ICD-10-CM | POA: Diagnosis not present

## 2017-12-04 DIAGNOSIS — E871 Hypo-osmolality and hyponatremia: Secondary | ICD-10-CM

## 2017-12-04 DIAGNOSIS — Z7689 Persons encountering health services in other specified circumstances: Secondary | ICD-10-CM | POA: Insufficient documentation

## 2017-12-04 DIAGNOSIS — C787 Secondary malignant neoplasm of liver and intrahepatic bile duct: Secondary | ICD-10-CM | POA: Diagnosis not present

## 2017-12-04 DIAGNOSIS — C7951 Secondary malignant neoplasm of bone: Secondary | ICD-10-CM | POA: Diagnosis not present

## 2017-12-04 DIAGNOSIS — C7970 Secondary malignant neoplasm of unspecified adrenal gland: Secondary | ICD-10-CM | POA: Insufficient documentation

## 2017-12-04 DIAGNOSIS — Z5112 Encounter for antineoplastic immunotherapy: Secondary | ICD-10-CM | POA: Diagnosis not present

## 2017-12-04 DIAGNOSIS — Z5111 Encounter for antineoplastic chemotherapy: Secondary | ICD-10-CM | POA: Insufficient documentation

## 2017-12-04 DIAGNOSIS — M549 Dorsalgia, unspecified: Secondary | ICD-10-CM | POA: Insufficient documentation

## 2017-12-04 DIAGNOSIS — M1990 Unspecified osteoarthritis, unspecified site: Secondary | ICD-10-CM | POA: Diagnosis not present

## 2017-12-04 DIAGNOSIS — M8580 Other specified disorders of bone density and structure, unspecified site: Secondary | ICD-10-CM | POA: Diagnosis not present

## 2017-12-04 LAB — CBC WITH DIFFERENTIAL (CANCER CENTER ONLY)
Basophils Absolute: 0.3 10*3/uL — ABNORMAL HIGH (ref 0.0–0.1)
Basophils Relative: 1 %
EOS PCT: 0 %
Eosinophils Absolute: 0 10*3/uL (ref 0.0–0.5)
HEMATOCRIT: 27.5 % — AB (ref 34.8–46.6)
Hemoglobin: 8.7 g/dL — ABNORMAL LOW (ref 11.6–15.9)
LYMPHS ABS: 2.3 10*3/uL (ref 0.9–3.3)
LYMPHS PCT: 9 %
MCH: 31 pg (ref 25.1–34.0)
MCHC: 31.6 g/dL (ref 31.5–36.0)
MCV: 97.9 fL (ref 79.5–101.0)
Monocytes Absolute: 1.8 10*3/uL — ABNORMAL HIGH (ref 0.1–0.9)
Monocytes Relative: 7 %
Neutro Abs: 21.4 10*3/uL — ABNORMAL HIGH (ref 1.5–6.5)
Neutrophils Relative %: 83 %
Platelet Count: 207 10*3/uL (ref 145–400)
RBC: 2.81 MIL/uL — ABNORMAL LOW (ref 3.70–5.45)
RDW: 22.2 % — AB (ref 11.2–14.5)
WBC: 25.7 10*3/uL — AB (ref 3.9–10.3)
nRBC: 8 /100 WBC — ABNORMAL HIGH

## 2017-12-04 LAB — CMP (CANCER CENTER ONLY)
ALT: 64 U/L — AB (ref 0–55)
AST: 44 U/L — AB (ref 5–34)
Albumin: 3.2 g/dL — ABNORMAL LOW (ref 3.5–5.0)
Alkaline Phosphatase: 722 U/L — ABNORMAL HIGH (ref 40–150)
Anion gap: 8 (ref 3–11)
BUN: 14 mg/dL (ref 7–26)
CHLORIDE: 105 mmol/L (ref 98–109)
CO2: 21 mmol/L — AB (ref 22–29)
CREATININE: 0.57 mg/dL — AB (ref 0.60–1.10)
Calcium: 8.3 mg/dL — ABNORMAL LOW (ref 8.4–10.4)
GFR, Estimated: >60 mL/min (ref >60)
Glucose, Bld: 101 mg/dL (ref 70–140)
Potassium: 4.7 mmol/L (ref 3.5–5.1)
SODIUM: 134 mmol/L — AB (ref 136–145)
Total Bilirubin: 0.6 mg/dL (ref 0.2–1.2)
Total Protein: 5.9 g/dL — ABNORMAL LOW (ref 6.4–8.3)

## 2017-12-04 LAB — RETICULOCYTES
RBC.: 2.81 MIL/uL — ABNORMAL LOW (ref 3.70–5.45)
Retic Count, Absolute: 205.1 10*3/uL — ABNORMAL HIGH (ref 33.7–90.7)
Retic Ct Pct: 7.3 % — ABNORMAL HIGH (ref 0.7–2.1)

## 2017-12-04 LAB — MAGNESIUM: Magnesium: 2.3 mg/dL (ref 1.5–2.5)

## 2017-12-04 MED ORDER — SODIUM CHLORIDE 0.9% FLUSH
10.0000 mL | INTRAVENOUS | Status: DC | PRN
  Administered 2017-12-04: 10 mL
  Filled 2017-12-04: qty 10

## 2017-12-04 MED ORDER — HEPARIN SOD (PORK) LOCK FLUSH 100 UNIT/ML IV SOLN
500.0000 [IU] | Freq: Once | INTRAVENOUS | Status: AC | PRN
  Administered 2017-12-04: 500 [IU]
  Filled 2017-12-04: qty 5

## 2017-12-04 NOTE — Telephone Encounter (Signed)
Left a detailed voice mail concerning upcoming appointment. Per 4/11 los, also asked patient to come by scheduling for updated copy, due to transfusion at [facility]. And get directions if needed.

## 2017-12-04 NOTE — Telephone Encounter (Signed)
Lab appointment scheduled per 4/11 sch msg

## 2017-12-04 NOTE — Patient Instructions (Signed)
Thank you for choosing [facility] to provide your oncology and hematology care.  To afford each patient quality time with our providers, please arrive 30 minutes before your scheduled appointment time.  If you arrive late for your appointment, you may be asked to reschedule.  We strive to give you quality time with our providers, and arriving late affects you and other patients whose appointments are after yours.   If you are a no show for multiple scheduled visits, you may be dismissed from the clinic at the providers discretion.    Again, thank you for choosing [facility], our hope is that these requests will decrease the amount of time that you wait before being seen by our physicians.  ______________________________________________________________________  Should you have questions after your visit to the [facility], please contact our office at [phone] between the hours of 8:30 and 4:30 p.m.    Voicemails left after 4:30p.m will not be returned until the following business day.    For prescription refill requests, please have your pharmacy contact us directly.  Please also try to allow 48 hours for prescription requests.    Please contact the scheduling department for questions regarding scheduling.  For scheduling of procedures such as PET scans, CT scans, MRI, Ultrasound, etc please contact central scheduling at [phone].    Resources For Cancer Patients and Caregivers:   Oncolink.org:  A wonderful resource for patients and healthcare providers for information regarding your disease, ways to tract your treatment, what to expect, etc.     American Cancer Society:  800-227-2345  Can help patients locate various types of support and financial assistance  Cancer Care: 1-800-813-HOPE (4673) Provides financial assistance, online support groups, medication/co-pay assistance.    [place] DSS:  336-641-3447 Where to apply for food  stamps, Medicaid, and utility assistance  Medicare Rights Center: 800-333-4114 Helps people with Medicare understand their rights and benefits, navigate the Medicare system, and secure the quality healthcare they deserve  SCAT: 336-333-6589 [employer] [employer]'s shared-ride transportation service for eligible riders who have a disability that prevents them from riding the fixed route bus.    For additional information on assistance programs please contact our social worker:   Grier Hock/Abigail Elmore:  [phone]            

## 2017-12-05 ENCOUNTER — Other Ambulatory Visit: Payer: Self-pay | Admitting: Hematology

## 2017-12-05 DIAGNOSIS — C3490 Malignant neoplasm of unspecified part of unspecified bronchus or lung: Secondary | ICD-10-CM

## 2017-12-05 MED ORDER — OXYCODONE HCL ER 15 MG PO T12A: 15.0000 mg | EXTENDED_RELEASE_TABLET | Freq: Two times a day (BID) | ORAL | 0 refills | Status: DC

## 2017-12-05 MED ORDER — DEXAMETHASONE 4 MG PO TABS: 4.0000 mg | ORAL_TABLET | Freq: Every day | ORAL | 0 refills | Status: DC

## 2017-12-08 ENCOUNTER — Inpatient Hospital Stay: Payer: No Typology Code available for payment source

## 2017-12-08 ENCOUNTER — Other Ambulatory Visit: Payer: Self-pay | Admitting: Medical

## 2017-12-08 ENCOUNTER — Ambulatory Visit: Payer: 59 | Admitting: Adult Health

## 2017-12-08 ENCOUNTER — Other Ambulatory Visit: Payer: 59

## 2017-12-08 ENCOUNTER — Other Ambulatory Visit: Payer: Self-pay | Admitting: *Deleted

## 2017-12-08 VITALS — BP 139/84 | HR 77 | Temp 97.9°F | Resp 18

## 2017-12-08 DIAGNOSIS — Z7189 Other specified counseling: Secondary | ICD-10-CM

## 2017-12-08 DIAGNOSIS — K209 Esophagitis, unspecified without bleeding: Secondary | ICD-10-CM

## 2017-12-08 DIAGNOSIS — Z5112 Encounter for antineoplastic immunotherapy: Secondary | ICD-10-CM | POA: Diagnosis not present

## 2017-12-08 DIAGNOSIS — C3490 Malignant neoplasm of unspecified part of unspecified bronchus or lung: Secondary | ICD-10-CM

## 2017-12-08 DIAGNOSIS — C7951 Secondary malignant neoplasm of bone: Secondary | ICD-10-CM

## 2017-12-08 LAB — ABO/RH: ABO/RH(D): O POS

## 2017-12-08 LAB — PREPARE RBC (CROSSMATCH)

## 2017-12-08 MED ORDER — DENOSUMAB 120 MG/1.7ML ~~LOC~~ SOLN
120.0000 mg | Freq: Once | SUBCUTANEOUS | Status: AC
  Administered 2017-12-08: 120 mg via SUBCUTANEOUS

## 2017-12-08 MED ORDER — SODIUM CHLORIDE 0.9 % IV SOLN
1200.0000 mg | Freq: Once | INTRAVENOUS | Status: AC
  Administered 2017-12-08: 1200 mg via INTRAVENOUS
  Filled 2017-12-08: qty 20

## 2017-12-08 MED ORDER — ACETAMINOPHEN 325 MG PO TABS
650.0000 mg | ORAL_TABLET | Freq: Once | ORAL | Status: AC
  Administered 2017-12-08: 650 mg via ORAL

## 2017-12-08 MED ORDER — ACETAMINOPHEN 325 MG PO TABS
ORAL_TABLET | ORAL | Status: AC
  Filled 2017-12-08: qty 2

## 2017-12-08 MED ORDER — DIPHENHYDRAMINE HCL 25 MG PO CAPS
ORAL_CAPSULE | ORAL | Status: AC
Start: 2017-12-08 — End: ?
  Filled 2017-12-08: qty 1

## 2017-12-08 MED ORDER — SODIUM CHLORIDE 0.9 % IV SOLN
Freq: Once | INTRAVENOUS | Status: AC
  Administered 2017-12-08: 10:00:00 via INTRAVENOUS

## 2017-12-08 MED ORDER — MAGIC MOUTHWASH W/LIDOCAINE: 10.0000 mL | Freq: Four times a day (QID) | ORAL | 2 refills | Status: DC | PRN

## 2017-12-08 MED ORDER — PALONOSETRON HCL INJECTION 0.25 MG/5ML
INTRAVENOUS | Status: AC
  Filled 2017-12-08: qty 5

## 2017-12-08 MED ORDER — SODIUM CHLORIDE 0.9% FLUSH
10.0000 mL | INTRAVENOUS | Status: AC | PRN
  Administered 2017-12-08: 10 mL
  Filled 2017-12-08: qty 10

## 2017-12-08 MED ORDER — DEXAMETHASONE SODIUM PHOSPHATE 10 MG/ML IJ SOLN: 10.0000 mg | Freq: Once | INTRAMUSCULAR | Status: DC

## 2017-12-08 MED ORDER — SODIUM CHLORIDE 0.9 % IV SOLN
356.5000 mg | Freq: Once | INTRAVENOUS | Status: AC
  Administered 2017-12-08: 360 mg via INTRAVENOUS
  Filled 2017-12-08: qty 36

## 2017-12-08 MED ORDER — PALONOSETRON HCL INJECTION 0.25 MG/5ML
0.2500 mg | Freq: Once | INTRAVENOUS | Status: AC
  Administered 2017-12-08: 0.25 mg via INTRAVENOUS

## 2017-12-08 MED ORDER — SODIUM CHLORIDE 0.9 % IV SOLN
Freq: Once | INTRAVENOUS | Status: AC
  Administered 2017-12-08: 10:00:00 via INTRAVENOUS
  Filled 2017-12-08: qty 5

## 2017-12-08 MED ORDER — SODIUM CHLORIDE 0.9 % IV SOLN
100.0000 mg/m2 | Freq: Once | INTRAVENOUS | Status: AC
  Administered 2017-12-08: 150 mg via INTRAVENOUS
  Filled 2017-12-08: qty 7.5

## 2017-12-08 MED ORDER — ACETAMINOPHEN 325 MG PO TABS: 650.0000 mg | ORAL_TABLET | Freq: Once | ORAL | Status: AC

## 2017-12-08 MED ORDER — DENOSUMAB 120 MG/1.7ML ~~LOC~~ SOLN
SUBCUTANEOUS | Status: AC
  Filled 2017-12-08: qty 1.7

## 2017-12-08 MED ORDER — HEPARIN SOD (PORK) LOCK FLUSH 100 UNIT/ML IV SOLN
500.0000 [IU] | Freq: Once | INTRAVENOUS | Status: AC | PRN
  Administered 2017-12-08: 500 [IU]
  Filled 2017-12-08: qty 5

## 2017-12-08 MED ORDER — DIPHENHYDRAMINE HCL 25 MG PO CAPS
25.0000 mg | ORAL_CAPSULE | Freq: Once | ORAL | Status: AC
  Administered 2017-12-08: 25 mg via ORAL

## 2017-12-08 MED ORDER — DIPHENHYDRAMINE HCL 25 MG PO CAPS: 25.0000 mg | ORAL_CAPSULE | Freq: Once | ORAL | Status: AC

## 2017-12-08 MED ORDER — SODIUM CHLORIDE 0.9% FLUSH
10.0000 mL | INTRAVENOUS | Status: DC | PRN
  Filled 2017-12-08: qty 10

## 2017-12-08 NOTE — Patient Instructions (Signed)
[facility] Discharge Instructions for Patients Receiving Chemotherapy  Today you received the following chemotherapy agents Carboplatin,Etoposide,Tecentriq,X-geva.  To help prevent nausea and vomiting after your treatment, we encourage you to take your nausea medication as directed   If you develop nausea and vomiting that is not controlled by your nausea medication, call the clinic.   BELOW ARE SYMPTOMS THAT SHOULD BE REPORTED IMMEDIATELY:  *FEVER GREATER THAN 100.5 F  *CHILLS WITH OR WITHOUT FEVER  NAUSEA AND VOMITING THAT IS NOT CONTROLLED WITH YOUR NAUSEA MEDICATION  *UNUSUAL SHORTNESS OF BREATH  *UNUSUAL BRUISING OR BLEEDING  TENDERNESS IN MOUTH AND THROAT WITH OR WITHOUT PRESENCE OF ULCERS  *URINARY PROBLEMS  *BOWEL PROBLEMS  UNUSUAL RASH Items with * indicate a potential emergency and should be followed up as soon as possible.  Feel free to call the clinic should you have any questions or concerns. The clinic phone number is [phone].  Please show the [facility] at check-in to the Emergency Department and triage nurse.

## 2017-12-08 NOTE — Progress Notes (Signed)
Discharge instructions given , pt was  advised to take calcium supplement since she getting XGeva , pt said that she is going to start taking it.

## 2017-12-09 ENCOUNTER — Inpatient Hospital Stay: Payer: No Typology Code available for payment source

## 2017-12-09 VITALS — BP 111/62 | HR 80 | Temp 97.9°F | Resp 17

## 2017-12-09 DIAGNOSIS — B370 Candidal stomatitis: Secondary | ICD-10-CM

## 2017-12-09 DIAGNOSIS — C3490 Malignant neoplasm of unspecified part of unspecified bronchus or lung: Secondary | ICD-10-CM

## 2017-12-09 DIAGNOSIS — Z7189 Other specified counseling: Secondary | ICD-10-CM

## 2017-12-09 DIAGNOSIS — Z5112 Encounter for antineoplastic immunotherapy: Secondary | ICD-10-CM | POA: Diagnosis not present

## 2017-12-09 MED ORDER — SODIUM CHLORIDE 0.9% FLUSH
10.0000 mL | INTRAVENOUS | Status: DC | PRN
  Administered 2017-12-09: 10 mL
  Filled 2017-12-09: qty 10

## 2017-12-09 MED ORDER — FLUCONAZOLE 100 MG PO TABS: 100.0000 mg | ORAL_TABLET | Freq: Every day | ORAL | 1 refills | Status: DC

## 2017-12-09 MED ORDER — SODIUM CHLORIDE 0.9 % IV SOLN
Freq: Once | INTRAVENOUS | Status: AC
  Administered 2017-12-09: 09:00:00 via INTRAVENOUS

## 2017-12-09 MED ORDER — DEXAMETHASONE SODIUM PHOSPHATE 10 MG/ML IJ SOLN
10.0000 mg | Freq: Once | INTRAMUSCULAR | Status: AC
Start: 2017-12-09 — End: 2017-12-09
  Administered 2017-12-09: 10 mg via INTRAVENOUS

## 2017-12-09 MED ORDER — HEPARIN SOD (PORK) LOCK FLUSH 100 UNIT/ML IV SOLN
500.0000 [IU] | Freq: Once | INTRAVENOUS | Status: AC | PRN
  Administered 2017-12-09: 500 [IU]
  Filled 2017-12-09: qty 5

## 2017-12-09 MED ORDER — SODIUM CHLORIDE 0.9 % IV SOLN
100.0000 mg/m2 | Freq: Once | INTRAVENOUS | Status: AC
  Administered 2017-12-09: 150 mg via INTRAVENOUS
  Filled 2017-12-09: qty 7.5

## 2017-12-09 MED ORDER — DEXAMETHASONE SODIUM PHOSPHATE 10 MG/ML IJ SOLN
INTRAMUSCULAR | Status: AC
  Filled 2017-12-09: qty 1

## 2017-12-09 NOTE — Patient Instructions (Addendum)
[facility] Discharge Instructions for Patients Receiving Chemotherapy  Today you received the following chemotherapy agents Etoposide  To help prevent nausea and vomiting after your treatment, we encourage you to take your nausea medication as directed   If you develop nausea and vomiting that is not controlled by your nausea medication, call the clinic.   BELOW ARE SYMPTOMS THAT SHOULD BE REPORTED IMMEDIATELY:  *FEVER GREATER THAN 100.5 F  *CHILLS WITH OR WITHOUT FEVER  NAUSEA AND VOMITING THAT IS NOT CONTROLLED WITH YOUR NAUSEA MEDICATION  *UNUSUAL SHORTNESS OF BREATH  *UNUSUAL BRUISING OR BLEEDING  TENDERNESS IN MOUTH AND THROAT WITH OR WITHOUT PRESENCE OF ULCERS  *URINARY PROBLEMS  *BOWEL PROBLEMS  UNUSUAL RASH Items with * indicate a potential emergency and should be followed up as soon as possible.  Feel free to call the clinic should you have any questions or concerns. The clinic phone number is [phone].  Please show the [place] at check-in to the Emergency Department and triage nurse.  Etoposide, VP-16 injection What is this medicine? ETOPOSIDE, VP-16 (e toe POE side) is a chemotherapy drug. It is used to treat testicular cancer, lung cancer, and other cancers. This medicine may be used for other purposes; ask your health care provider or pharmacist if you have questions. COMMON BRAND NAME(S): Etopophos, Toposar, VePesid What should I tell my health care provider before I take this medicine? They need to know if you have any of these conditions: -infection -kidney disease -liver disease -low blood counts, like low white cell, platelet, or red cell counts -an unusual or allergic reaction to etoposide, other medicines, foods, dyes, or preservatives -pregnant or trying to get pregnant -breast-feeding How should I use this medicine? This medicine is for infusion into a vein. It is administered in a hospital or clinic by a specially  trained health care professional. Talk to your pediatrician regarding the use of this medicine in children. Special care may be needed. Overdosage: If you think you have taken too much of this medicine contact a poison control center or emergency room at once. NOTE: This medicine is only for you. Do not share this medicine with others. What if I miss a dose? It is important not to miss your dose. Call your doctor or health care professional if you are unable to keep an appointment. What may interact with this medicine? -aspirin -certain medications for seizures like carbamazepine, phenobarbital, phenytoin, valproic acid -cyclosporine -levamisole -warfarin This list may not describe all possible interactions. Give your health care provider a list of all the medicines, herbs, non-prescription drugs, or dietary supplements you use. Also tell them if you smoke, drink alcohol, or use illegal drugs. Some items may interact with your medicine. What should I watch for while using this medicine? Visit your doctor for checks on your progress. This drug may make you feel generally unwell. This is not uncommon, as chemotherapy can affect healthy cells as well as cancer cells. Report any side effects. Continue your course of treatment even though you feel ill unless your doctor tells you to stop. In some cases, you may be given additional medicines to help with side effects. Follow all directions for their use. Call your doctor or health care professional for advice if you get a fever, chills or sore throat, or other symptoms of a cold or flu. Do not treat yourself. This drug decreases your body's ability to fight infections. Try to avoid being around people who are sick. This  medicine may increase your risk to bruise or bleed. Call your doctor or health care professional if you notice any unusual bleeding. Talk to your doctor about your risk of cancer. You may be more at risk for certain types of cancers if  you take this medicine. Do not become pregnant while taking this medicine or for at least 6 months after stopping it. Women should inform their doctor if they wish to become pregnant or think they might be pregnant. Women of child-bearing potential will need to have a negative pregnancy test before starting this medicine. There is a potential for serious side effects to an unborn child. Talk to your health care professional or pharmacist for more information. Do not breast-feed an infant while taking this medicine. Men must use a latex condom during sexual contact with a woman while taking this medicine and for at least 4 months after stopping it. A latex condom is needed even if you have had a vasectomy. Contact your doctor right away if your partner becomes pregnant. Do not donate sperm while taking this medicine and for at least 4 months after you stop taking this medicine. Men should inform their doctors if they wish to father a child. This medicine may lower sperm counts. What side effects may I notice from receiving this medicine? Side effects that you should report to your doctor or health care professional as soon as possible: -allergic reactions like skin rash, itching or hives, swelling of the face, lips, or tongue -low blood counts - this medicine may decrease the number of white blood cells, red blood cells and platelets. You may be at increased risk for infections and bleeding. -signs of infection - fever or chills, cough, sore throat, pain or difficulty passing urine -signs of decreased platelets or bleeding - bruising, pinpoint red spots on the skin, black, tarry stools, blood in the urine -signs of decreased red blood cells - unusually weak or tired, fainting spells, lightheadedness -breathing problems -changes in vision -mouth or throat sores or ulcers -pain, redness, swelling or irritation at the injection site -pain, tingling, numbness in the hands or feet -redness, blistering,  peeling or loosening of the skin, including inside the mouth -seizures -vomiting Side effects that usually do not require medical attention (report to your doctor or health care professional if they continue or are bothersome): -diarrhea -hair loss -loss of appetite -nausea -stomach pain This list may not describe all possible side effects. Call your doctor for medical advice about side effects. You may report side effects to FDA at 1-800-FDA-1088. Where should I keep my medicine? This drug is given in a hospital or clinic and will not be stored at home. NOTE: This sheet is a summary. It may not cover all possible information. If you have questions about this medicine, talk to your doctor, pharmacist, or health care provider.  2018 Elsevier/Gold Standard (2015-08-04 11:53:23)

## 2017-12-09 NOTE — Progress Notes (Signed)
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  Met with patient and sister this am during infusion.  Patient eating cheetos during visit.  Reports that appetite is good, no issues with nausea.  Reports biggest symptom is cold sensitivity.  Reports that she is drinking 2 ensure plus per day.  Typically eats cereal or drinks ensure plus in am with medications.  Has late brunch per sister.  Dinner is usually meat, vegetables and starch.      Medications: reviewed  Labs: reviewed  Anthropometrics:   Weight increased to 115 lb 4.8 oz from 101 lb on 3/20.    UBW 125 lb   NUTRITION DIAGNOSIS: severe malnutrition continues   MALNUTRITION DIAGNOSIS: severe malnutrition continues   INTERVENTION:   Reviewed strategies for increasing calories and protein.   Congratulated patient on weight gain.   Continue ensure plus at least 2 per day for increased calories and protein.  Patient given 2nd case of ensure plus today.    MONITORING, EVALUATION, GOAL: po intake, weight trends   NEXT VISIT: May 7 during infusion  Gissela Bloch B. Zenia Resides, [place], [place] Registered Dietitian [phone] (pager)

## 2017-12-10 ENCOUNTER — Inpatient Hospital Stay: Payer: No Typology Code available for payment source

## 2017-12-10 VITALS — BP 107/55 | HR 88 | Temp 98.3°F | Resp 18

## 2017-12-10 DIAGNOSIS — C3490 Malignant neoplasm of unspecified part of unspecified bronchus or lung: Secondary | ICD-10-CM

## 2017-12-10 DIAGNOSIS — Z5112 Encounter for antineoplastic immunotherapy: Secondary | ICD-10-CM | POA: Diagnosis not present

## 2017-12-10 DIAGNOSIS — Z7189 Other specified counseling: Secondary | ICD-10-CM

## 2017-12-10 MED ORDER — DEXAMETHASONE SODIUM PHOSPHATE 10 MG/ML IJ SOLN
INTRAMUSCULAR | Status: AC
  Filled 2017-12-10: qty 1

## 2017-12-10 MED ORDER — HEPARIN SOD (PORK) LOCK FLUSH 100 UNIT/ML IV SOLN
500.0000 [IU] | Freq: Once | INTRAVENOUS | Status: AC | PRN
  Administered 2017-12-10: 500 [IU]
  Filled 2017-12-10: qty 5

## 2017-12-10 MED ORDER — SODIUM CHLORIDE 0.9 % IV SOLN
Freq: Once | INTRAVENOUS | Status: AC
  Administered 2017-12-10: 09:00:00 via INTRAVENOUS

## 2017-12-10 MED ORDER — DEXAMETHASONE SODIUM PHOSPHATE 10 MG/ML IJ SOLN
10.0000 mg | Freq: Once | INTRAMUSCULAR | Status: AC
  Administered 2017-12-10: 10 mg via INTRAVENOUS

## 2017-12-10 MED ORDER — SODIUM CHLORIDE 0.9% FLUSH
10.0000 mL | INTRAVENOUS | Status: DC | PRN
Start: 2017-12-10 — End: 2017-12-10
  Administered 2017-12-10: 10 mL
  Filled 2017-12-10: qty 10

## 2017-12-10 MED ORDER — SODIUM CHLORIDE 0.9 % IV SOLN
100.0000 mg/m2 | Freq: Once | INTRAVENOUS | Status: AC
  Administered 2017-12-10: 150 mg via INTRAVENOUS
  Filled 2017-12-10: qty 7.5

## 2017-12-10 NOTE — Patient Instructions (Signed)
[facility] Discharge Instructions for Patients Receiving Chemotherapy  Today you received the following chemotherapy agents Etoposide  To help prevent nausea and vomiting after your treatment, we encourage you to take your nausea medication as directed  If you develop nausea and vomiting that is not controlled by your nausea medication, call the clinic.   BELOW ARE SYMPTOMS THAT SHOULD BE REPORTED IMMEDIATELY:  *FEVER GREATER THAN 100.5 F  *CHILLS WITH OR WITHOUT FEVER  NAUSEA AND VOMITING THAT IS NOT CONTROLLED WITH YOUR NAUSEA MEDICATION  *UNUSUAL SHORTNESS OF BREATH  *UNUSUAL BRUISING OR BLEEDING  TENDERNESS IN MOUTH AND THROAT WITH OR WITHOUT PRESENCE OF ULCERS  *URINARY PROBLEMS  *BOWEL PROBLEMS  UNUSUAL RASH Items with * indicate a potential emergency and should be followed up as soon as possible.  Feel free to call the clinic should you have any questions or concerns. The clinic phone number is [phone].  Please show the [facility] at check-in to the Emergency Department and triage nurse.

## 2017-12-11 ENCOUNTER — Encounter (HOSPITAL_COMMUNITY): Payer: 59

## 2017-12-11 LAB — PREPARE RBC (CROSSMATCH)

## 2017-12-12 ENCOUNTER — Inpatient Hospital Stay: Payer: No Typology Code available for payment source

## 2017-12-12 DIAGNOSIS — Z7189 Other specified counseling: Secondary | ICD-10-CM

## 2017-12-12 DIAGNOSIS — C3490 Malignant neoplasm of unspecified part of unspecified bronchus or lung: Secondary | ICD-10-CM

## 2017-12-12 DIAGNOSIS — Z5112 Encounter for antineoplastic immunotherapy: Secondary | ICD-10-CM | POA: Diagnosis not present

## 2017-12-12 LAB — TYPE AND SCREEN
ABO/RH(D): O POS
ANTIBODY SCREEN: NEGATIVE
Unit division: 0
Unit division: 0

## 2017-12-12 LAB — BPAM RBC
Blood Product Expiration Date: 201905112359
Blood Product Expiration Date: 201905152359
ISSUE DATE / TIME: 201904151403
Unit Type and Rh: 5100
Unit Type and Rh: 5100

## 2017-12-12 MED ORDER — PEGFILGRASTIM-CBQV 6 MG/0.6ML ~~LOC~~ SOSY
6.0000 mg | PREFILLED_SYRINGE | Freq: Once | SUBCUTANEOUS | Status: AC
  Administered 2017-12-12: 6 mg via SUBCUTANEOUS

## 2017-12-12 MED ORDER — PEGFILGRASTIM-CBQV 6 MG/0.6ML ~~LOC~~ SOSY
PREFILLED_SYRINGE | SUBCUTANEOUS | Status: AC
  Filled 2017-12-12: qty 0.6

## 2017-12-12 NOTE — Patient Instructions (Signed)
Pegfilgrastim injection What is this medicine? PEGFILGRASTIM (PEG fil gra stim) is a long-acting granulocyte colony-stimulating factor that stimulates the growth of neutrophils, a type of white blood cell important in the body's fight against infection. It is used to reduce the incidence of fever and infection in patients with certain types of cancer who are receiving chemotherapy that affects the bone marrow, and to increase survival after being exposed to high doses of radiation. This medicine may be used for other purposes; ask your health care provider or pharmacist if you have questions. COMMON BRAND NAME(S): Neulasta What should I tell my health care provider before I take this medicine? They need to know if you have any of these conditions: -kidney disease -latex allergy -ongoing radiation therapy -sickle cell disease -skin reactions to acrylic adhesives (On-Body Injector only) -an unusual or allergic reaction to pegfilgrastim, filgrastim, other medicines, foods, dyes, or preservatives -pregnant or trying to get pregnant -breast-feeding How should I use this medicine? This medicine is for injection under the skin. If you get this medicine at home, you will be taught how to prepare and give the pre-filled syringe or how to use the On-body Injector. Refer to the patient Instructions for Use for detailed instructions. Use exactly as directed. Tell your healthcare provider immediately if you suspect that the On-body Injector may not have performed as intended or if you suspect the use of the On-body Injector resulted in a missed or partial dose. It is important that you put your used needles and syringes in a special sharps container. Do not put them in a trash can. If you do not have a sharps container, call your pharmacist or healthcare provider to get one. Talk to your pediatrician regarding the use of this medicine in children. While this drug may be prescribed for selected conditions,  precautions do apply. Overdosage: If you think you have taken too much of this medicine contact a poison control center or emergency room at once. NOTE: This medicine is only for you. Do not share this medicine with others. What if I miss a dose? It is important not to miss your dose. Call your doctor or health care professional if you miss your dose. If you miss a dose due to an On-body Injector failure or leakage, a new dose should be administered as soon as possible using a single prefilled syringe for manual use. What may interact with this medicine? Interactions have not been studied. Give your health care provider a list of all the medicines, herbs, non-prescription drugs, or dietary supplements you use. Also tell them if you smoke, drink alcohol, or use illegal drugs. Some items may interact with your medicine. This list may not describe all possible interactions. Give your health care provider a list of all the medicines, herbs, non-prescription drugs, or dietary supplements you use. Also tell them if you smoke, drink alcohol, or use illegal drugs. Some items may interact with your medicine. What should I watch for while using this medicine? You may need blood work done while you are taking this medicine. If you are going to need a MRI, CT scan, or other procedure, tell your doctor that you are using this medicine (On-Body Injector only). What side effects may I notice from receiving this medicine? Side effects that you should report to your doctor or health care professional as soon as possible: -allergic reactions like skin rash, itching or hives, swelling of the face, lips, or tongue -dizziness -fever -pain, redness, or irritation at site   where injected -pinpoint red spots on the skin -red or dark-brown urine -shortness of breath or breathing problems -stomach or side pain, or pain at the shoulder -swelling -tiredness -trouble passing urine or change in the amount of urine Side  effects that usually do not require medical attention (report to your doctor or health care professional if they continue or are bothersome): -bone pain -muscle pain This list may not describe all possible side effects. Call your doctor for medical advice about side effects. You may report side effects to FDA at 1-800-FDA-1088. Where should I keep my medicine? Keep out of the reach of children. Store pre-filled syringes in a refrigerator between 2 and 8 degrees C (36 and 46 degrees F). Do not freeze. Keep in carton to protect from light. Throw away this medicine if it is left out of the refrigerator for more than 48 hours. Throw away any unused medicine after the expiration date. NOTE: This sheet is a summary. It may not cover all possible information. If you have questions about this medicine, talk to your doctor, pharmacist, or health care provider.  2018 Elsevier/Gold Standard (2016-08-08 12:58:03)  

## 2017-12-15 ENCOUNTER — Telehealth: Payer: Self-pay | Admitting: *Deleted

## 2017-12-15 ENCOUNTER — Encounter: Payer: Self-pay | Admitting: *Deleted

## 2017-12-15 NOTE — Telephone Encounter (Signed)
Patient calling to ask if she should have her MRI and PET scan before her 4th cycle of chemotherapy. They are scheduled for may 3rd. She thought dr Irene Limbo wanted scans after her 4th cycle. Please advise

## 2017-12-16 ENCOUNTER — Ambulatory Visit: Payer: 59 | Admitting: Radiation Oncology

## 2017-12-19 ENCOUNTER — Other Ambulatory Visit: Payer: Self-pay

## 2017-12-19 ENCOUNTER — Telehealth: Payer: Self-pay | Admitting: *Deleted

## 2017-12-19 ENCOUNTER — Encounter (HOSPITAL_COMMUNITY): Payer: Self-pay | Admitting: *Deleted

## 2017-12-19 ENCOUNTER — Other Ambulatory Visit (HOSPITAL_COMMUNITY): Payer: Self-pay

## 2017-12-19 ENCOUNTER — Emergency Department (HOSPITAL_COMMUNITY): Payer: No Typology Code available for payment source

## 2017-12-19 ENCOUNTER — Inpatient Hospital Stay (HOSPITAL_COMMUNITY)
Admission: EM | Admit: 2017-12-19 | Discharge: 2017-12-22 | DRG: 194 | Disposition: A | Discharge status: Home / Self Care | Payer: No Typology Code available for payment source | Attending: Internal Medicine | Admitting: Internal Medicine

## 2017-12-19 DIAGNOSIS — T380X5A Adverse effect of glucocorticoids and synthetic analogues, initial encounter: Secondary | ICD-10-CM | POA: Diagnosis present

## 2017-12-19 DIAGNOSIS — Z87891 Personal history of nicotine dependence: Secondary | ICD-10-CM | POA: Diagnosis not present

## 2017-12-19 DIAGNOSIS — Y95 Nosocomial condition: Secondary | ICD-10-CM | POA: Diagnosis present

## 2017-12-19 DIAGNOSIS — Z9221 Personal history of antineoplastic chemotherapy: Secondary | ICD-10-CM | POA: Diagnosis not present

## 2017-12-19 DIAGNOSIS — Z79899 Other long term (current) drug therapy: Secondary | ICD-10-CM | POA: Diagnosis not present

## 2017-12-19 DIAGNOSIS — R0902 Hypoxemia: Secondary | ICD-10-CM | POA: Diagnosis present

## 2017-12-19 DIAGNOSIS — J189 Pneumonia, unspecified organism: Secondary | ICD-10-CM | POA: Diagnosis not present

## 2017-12-19 DIAGNOSIS — C7951 Secondary malignant neoplasm of bone: Secondary | ICD-10-CM | POA: Diagnosis present

## 2017-12-19 DIAGNOSIS — J309 Allergic rhinitis, unspecified: Secondary | ICD-10-CM | POA: Diagnosis present

## 2017-12-19 DIAGNOSIS — C799 Secondary malignant neoplasm of unspecified site: Secondary | ICD-10-CM | POA: Diagnosis not present

## 2017-12-19 DIAGNOSIS — M8580 Other specified disorders of bone density and structure, unspecified site: Secondary | ICD-10-CM | POA: Diagnosis present

## 2017-12-19 DIAGNOSIS — C3490 Malignant neoplasm of unspecified part of unspecified bronchus or lung: Secondary | ICD-10-CM | POA: Diagnosis present

## 2017-12-19 DIAGNOSIS — C787 Secondary malignant neoplasm of liver and intrahepatic bile duct: Secondary | ICD-10-CM | POA: Diagnosis present

## 2017-12-19 DIAGNOSIS — D72829 Elevated white blood cell count, unspecified: Secondary | ICD-10-CM | POA: Diagnosis not present

## 2017-12-19 DIAGNOSIS — F39 Unspecified mood [affective] disorder: Secondary | ICD-10-CM | POA: Diagnosis present

## 2017-12-19 LAB — CBC WITH DIFFERENTIAL/PLATELET
BASOS PCT: 0 %
Basophils Absolute: 0 10*3/uL (ref 0.0–0.1)
EOS PCT: 0 %
Eosinophils Absolute: 0 10*3/uL (ref 0.0–0.7)
HEMATOCRIT: 33.3 % — AB (ref 36.0–46.0)
HEMOGLOBIN: 10.7 g/dL — AB (ref 12.0–15.0)
LYMPHS ABS: 2.1 10*3/uL (ref 0.7–4.0)
Lymphocytes Relative: 12 %
MCH: 31.6 pg (ref 26.0–34.0)
MCHC: 32.1 g/dL (ref 30.0–36.0)
MCV: 98.2 fL (ref 78.0–100.0)
Monocytes Absolute: 2.1 10*3/uL — ABNORMAL HIGH (ref 0.1–1.0)
Monocytes Relative: 12 %
NEUTROS ABS: 13.4 10*3/uL — AB (ref 1.7–7.7)
NRBC: 2 /100{WBCs} — AB
Neutrophils Relative %: 76 %
Platelets: 181 10*3/uL (ref 150–400)
RBC: 3.39 MIL/uL — ABNORMAL LOW (ref 3.87–5.11)
RDW: 21.1 % — AB (ref 11.5–15.5)
WBC: 17.6 10*3/uL — ABNORMAL HIGH (ref 4.0–10.5)

## 2017-12-19 LAB — COMPREHENSIVE METABOLIC PANEL
ALBUMIN: 3.5 g/dL (ref 3.5–5.0)
ALK PHOS: 525 U/L — AB (ref 38–126)
ALT: 41 U/L (ref 14–54)
AST: 29 U/L (ref 15–41)
Anion gap: 11 (ref 5–15)
BILIRUBIN TOTAL: 0.6 mg/dL (ref 0.3–1.2)
BUN: 10 mg/dL (ref 6–20)
CALCIUM: 7.9 mg/dL — AB (ref 8.9–10.3)
CO2: 23 mmol/L (ref 22–32)
Chloride: 99 mmol/L — ABNORMAL LOW (ref 101–111)
Creatinine, Ser: 0.44 mg/dL (ref 0.44–1.00)
GFR calc Af Amer: >60 mL/min (ref >60)
GLUCOSE: 129 mg/dL — AB (ref 65–99)
POTASSIUM: 3.8 mmol/L (ref 3.5–5.1)
Sodium: 133 mmol/L — ABNORMAL LOW (ref 135–145)
TOTAL PROTEIN: 6.7 g/dL (ref 6.5–8.1)

## 2017-12-19 LAB — I-STAT CG4 LACTIC ACID, ED
Lactic Acid, Venous: 2.1 mmol/L (ref 0.5–1.9)
Lactic Acid, Venous: 3.54 mmol/L (ref 0.5–1.9)

## 2017-12-19 LAB — I-STAT TROPONIN, ED: TROPONIN I, POC: 0.03 ng/mL (ref 0.00–0.08)

## 2017-12-19 MED ORDER — CALCIUM CARBONATE 1250 (500 CA) MG PO TABS
1.0000 | ORAL_TABLET | Freq: Two times a day (BID) | ORAL | Status: DC
  Administered 2017-12-20 – 2017-12-22 (×5): 500 mg via ORAL
  Filled 2017-12-19 (×5): qty 1

## 2017-12-19 MED ORDER — SODIUM CHLORIDE 0.9% FLUSH: 3.0000 mL | INTRAVENOUS | Status: DC | PRN

## 2017-12-19 MED ORDER — SODIUM CHLORIDE 0.9% FLUSH
3.0000 mL | Freq: Two times a day (BID) | INTRAVENOUS | Status: DC
  Administered 2017-12-19: 3 mL via INTRAVENOUS

## 2017-12-19 MED ORDER — VANCOMYCIN HCL IN DEXTROSE 1-5 GM/200ML-% IV SOLN
1000.0000 mg | Freq: Once | INTRAVENOUS | Status: AC
  Administered 2017-12-19: 1000 mg via INTRAVENOUS
  Filled 2017-12-19: qty 200

## 2017-12-19 MED ORDER — SODIUM CHLORIDE 0.9 % IV SOLN
2.0000 g | Freq: Three times a day (TID) | INTRAVENOUS | Status: DC
  Administered 2017-12-19 – 2017-12-22 (×8): 2 g via INTRAVENOUS
  Filled 2017-12-19 (×9): qty 2

## 2017-12-19 MED ORDER — SODIUM CHLORIDE 0.9 % IV SOLN: 250.0000 mL | INTRAVENOUS | Status: DC | PRN

## 2017-12-19 MED ORDER — OXYCODONE HCL ER 15 MG PO T12A
15.0000 mg | EXTENDED_RELEASE_TABLET | Freq: Two times a day (BID) | ORAL | Status: DC
  Administered 2017-12-19 – 2017-12-22 (×6): 15 mg via ORAL
  Filled 2017-12-19 (×6): qty 1

## 2017-12-19 MED ORDER — METHYLPREDNISOLONE SODIUM SUCC 125 MG IJ SOLR
125.0000 mg | Freq: Once | INTRAMUSCULAR | Status: AC
  Administered 2017-12-19: 125 mg via INTRAVENOUS
  Filled 2017-12-19: qty 2

## 2017-12-19 MED ORDER — HEPARIN SODIUM (PORCINE) 5000 UNIT/ML IJ SOLN
5000.0000 [IU] | Freq: Three times a day (TID) | INTRAMUSCULAR | Status: DC
  Administered 2017-12-19 – 2017-12-21 (×4): 5000 [IU] via SUBCUTANEOUS
  Filled 2017-12-19 (×5): qty 1

## 2017-12-19 MED ORDER — SENNOSIDES-DOCUSATE SODIUM 8.6-50 MG PO TABS
2.0000 | ORAL_TABLET | Freq: Every day | ORAL | Status: DC
  Administered 2017-12-20 – 2017-12-22 (×2): 2 via ORAL
  Filled 2017-12-19 (×3): qty 2

## 2017-12-19 MED ORDER — POLYETHYLENE GLYCOL 3350 17 G PO PACK: 17.0000 g | PACK | Freq: Every day | ORAL | Status: DC | PRN

## 2017-12-19 MED ORDER — LACTATED RINGERS IV SOLN
INTRAVENOUS | Status: DC
  Administered 2017-12-19: 21:00:00 via INTRAVENOUS

## 2017-12-19 MED ORDER — IOPAMIDOL (ISOVUE-370) INJECTION 76%
INTRAVENOUS | Status: AC
  Filled 2017-12-19: qty 100

## 2017-12-19 MED ORDER — ALBUTEROL SULFATE (2.5 MG/3ML) 0.083% IN NEBU
5.0000 mg | INHALATION_SOLUTION | Freq: Once | RESPIRATORY_TRACT | Status: AC
  Administered 2017-12-19: 5 mg via RESPIRATORY_TRACT
  Filled 2017-12-19: qty 6

## 2017-12-19 MED ORDER — PROCHLORPERAZINE MALEATE 10 MG PO TABS: 10.0000 mg | ORAL_TABLET | Freq: Four times a day (QID) | ORAL | Status: DC | PRN

## 2017-12-19 MED ORDER — SODIUM CHLORIDE 0.9% FLUSH: 10.0000 mL | INTRAVENOUS | Status: DC | PRN

## 2017-12-19 MED ORDER — IOPAMIDOL (ISOVUE-370) INJECTION 76%
100.0000 mL | Freq: Once | INTRAVENOUS | Status: AC | PRN
  Administered 2017-12-19: 100 mL via INTRAVENOUS

## 2017-12-19 MED ORDER — PIPERACILLIN-TAZOBACTAM 3.375 G IVPB 30 MIN
3.3750 g | Freq: Once | INTRAVENOUS | Status: AC
  Administered 2017-12-19: 3.375 g via INTRAVENOUS
  Filled 2017-12-19: qty 50

## 2017-12-19 MED ORDER — FLUCONAZOLE 100 MG PO TABS
100.0000 mg | ORAL_TABLET | Freq: Every day | ORAL | Status: DC
  Administered 2017-12-20 – 2017-12-22 (×3): 100 mg via ORAL
  Filled 2017-12-19 (×3): qty 1

## 2017-12-19 MED ORDER — VANCOMYCIN HCL IN DEXTROSE 1-5 GM/200ML-% IV SOLN
1000.0000 mg | INTRAVENOUS | Status: DC
  Administered 2017-12-20 – 2017-12-21 (×2): 1000 mg via INTRAVENOUS
  Filled 2017-12-19 (×2): qty 200

## 2017-12-19 MED ORDER — ADULT MULTIVITAMIN W/MINERALS CH
1.0000 | ORAL_TABLET | Freq: Every day | ORAL | Status: DC
  Filled 2017-12-19: qty 1

## 2017-12-19 MED ORDER — MAGIC MOUTHWASH W/LIDOCAINE
10.0000 mL | Freq: Four times a day (QID) | ORAL | Status: DC | PRN
  Filled 2017-12-19: qty 10

## 2017-12-19 MED ORDER — DEXAMETHASONE 4 MG PO TABS
4.0000 mg | ORAL_TABLET | Freq: Every day | ORAL | Status: DC
  Administered 2017-12-20 – 2017-12-22 (×3): 4 mg via ORAL
  Filled 2017-12-19 (×3): qty 1

## 2017-12-19 MED ORDER — LORAZEPAM 0.5 MG PO TABS: 0.5000 mg | ORAL_TABLET | Freq: Four times a day (QID) | ORAL | Status: DC | PRN

## 2017-12-19 NOTE — H&P (Signed)
History and Physical    Haley [name] UUV:[id number] DOB: 31-Jul-1951 DOA: 12/19/2017  PCP: Alycia Rossetti, MD  Patient coming from: home  Chief Complaint: sob  HPI: Haley [name] is a 67 y.o. 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 Presenting with 1 weeks or worsening sob. The problem occurred gradually and has progressively got worse. Not associate with hemoptysis or tachycardia. Pt complaints of cough. Nothing the patient is aware of makes it better.  ED Course: Pt had CT scan which reported pna we were subsequently consulted for further evaluation and recommendations.  Review of Systems: As per HPI otherwise 10 point review of systems negative.    Past Medical History:  Diagnosis Date  . Allergy   . Osteopenia     Past Surgical History:  Procedure Laterality Date  . COLONOSCOPY    . COLONOSCOPY WITH PROPOFOL N/A 10/25/2014   Procedure: COLONOSCOPY WITH PROPOFOL;  Surgeon: Garlan Fair, MD;  Location: [facility];  Service: Endoscopy;  Laterality: N/A;  . ERCP N/A 10/15/2017   Procedure: ENDOSCOPIC RETROGRADE CHOLANGIOPANCREATOGRAPHY (ERCP);  Surgeon: Clarene Essex, MD;  Location: [facility];  Service: Endoscopy;  Laterality: N/A;  . IR FLUORO GUIDE PORT INSERTION RIGHT  11/10/2017  . IR RADIOLOGIST EVAL & MGMT  10/02/2017  . IR US GUIDE VASC ACCESS RIGHT  11/10/2017     reports that she quit smoking about 2 months ago. Her smoking use included cigarettes. She has a 40.00 pack-year smoking history. She has never used smokeless tobacco. She reports that she drinks alcohol. She reports that she does not use drugs.  No Known Allergies  Family History  Problem Relation Age of Onset  . Diabetes Mother   . Hypertension Mother   . Diabetes Sister   . Hypertension Sister   . Hyperlipidemia Sister     Prior to Admission medications   Medication Sig Start Date End Date Taking? Authorizing  Provider  calcium carbonate (OS-CAL - DOSED IN MG OF ELEMENTAL CALCIUM) 1250 (500 Ca) MG tablet Take 1 tablet by mouth 2 (two) times daily with a meal.   Yes [provider]  dexamethasone (DECADRON) 4 MG tablet Take 1 tablet (4 mg total) by mouth daily with breakfast. 12/05/17  Yes Brunetta Genera, MD  fexofenadine (ALLEGRA) 60 MG tablet Take 60 mg by mouth daily as needed for allergies or rhinitis.   Yes [provider]  fluconazole (DIFLUCAN) 100 MG tablet Take 1 tablet (100 mg total) by mouth daily. On all days except for 5 days during chemotherapy and until neulasta shot. 12/09/17  Yes Brunetta Genera, MD  lidocaine-prilocaine (EMLA) cream Apply to affected area once 10/22/17  Yes Brunetta Genera, MD  LORazepam (ATIVAN) 0.5 MG tablet Take 1 tablet (0.5 mg total) by mouth every 6 (six) hours as needed (Nausea or vomiting). 10/22/17  Yes Brunetta Genera, MD  magic mouthwash w/lidocaine SOLN Take 10 mLs by mouth 4 (four) times daily as needed for mouth pain. Rinse and spit or swallow 12/08/17  Yes Tanner, Lyndon Code., PA-C  Multiple Vitamins-Minerals (MULTIVITAMIN WITH MINERALS) tablet Take 1 tablet by mouth daily.   Yes [provider]  ondansetron (ZOFRAN) 4 MG tablet Take 1 tablet (4 mg total) by mouth every 8 (eight) hours as needed for nausea or vomiting. 10/18/17  Yes Bonnielee Haff, MD  oxyCODONE (OXYCONTIN) 15 mg 12 hr tablet Take 1 tablet (  15 mg total) by mouth every 12 (twelve) hours. 12/05/17  Yes Brunetta Genera, MD  polyethylene glycol [facility]) packet Take 17 g by mouth daily as needed for moderate constipation. 10/18/17  Yes Bonnielee Haff, MD  prochlorperazine (COMPAZINE) 10 MG tablet Take 1 tablet (10 mg total) by mouth every 6 (six) hours as needed (Nausea or vomiting). 10/22/17  Yes Brunetta Genera, MD  senna-docusate (SENNA S) 8.6-50 MG tablet Take 2 tablets by mouth at bedtime. 09/30/17  Yes Brunetta Genera, MD  oxyCODONE (OXY  IR/ROXICODONE) 5 MG immediate release tablet Take 1-2 tablets (5-10 mg total) by mouth every 4 (four) hours as needed for severe pain. Patient not taking: Reported on 12/19/2017 10/22/17   Brunetta Genera, MD  tiZANidine (ZANAFLEX) 4 MG tablet Take 1 tablet (4 mg total) by mouth every 8 (eight) hours as needed for muscle spasms. Patient not taking: Reported on 12/19/2017 10/18/17   Bonnielee Haff, MD    Physical Exam: Vitals:   12/19/17 1530 12/19/17 1604 12/19/17 1630 12/19/17 1649  BP:  92/60 116/81 116/81  Pulse:  88 85 85  Resp:  (!) 22 (!) 22 16  Temp:      TempSrc:      SpO2: 90% 91% 92% 92%    Constitutional: NAD, calm, comfortable Vitals:   12/19/17 1530 12/19/17 1604 12/19/17 1630 12/19/17 1649  BP:  92/60 116/81 116/81  Pulse:  88 85 85  Resp:  (!) 22 (!) 22 16  Temp:      TempSrc:      SpO2: 90% 91% 92% 92%   Eyes: PERRL, lids and conjunctivae normal ENMT: Mucous membranes are moist. Posterior pharynx clear of any exudate or lesions.Normal dentition.  Neck: normal, supple, no masses, no thyromegaly Respiratory: rhales, no wheezes, equal chest rise.  Cardiovascular: Regular rate and rhythm, no murmurs / rubs / gallops. .  Abdomen: no tenderness, no masses palpated. No hepatosplenomegaly. Bowel sounds positive.  Musculoskeletal: no clubbing / cyanosis. No joint deformity upper and lower extremities.  Skin: no rashes, lesions, ulcers. No induration, on limited exam. Neurologic: Pt has no facial asymmetry, moves extremities equally.  Psychiatric: Normal judgment and insight. Normal mood.   Labs on Admission: I have personally reviewed following labs and imaging studies  CBC: Recent Labs  Lab 12/19/17 1242  WBC 17.6*  NEUTROABS 13.4*  HGB 10.7*  HCT 33.3*  MCV 98.2  PLT 948   Basic Metabolic Panel: Recent Labs  Lab 12/19/17 1242  NA 133*  K 3.8  CL 99*  CO2 23  GLUCOSE 129*  BUN 10  CREATININE 0.44  CALCIUM 7.9*   GFR: CrCl cannot be calculated  (Unknown ideal weight.). Liver Function Tests: Recent Labs  Lab 12/19/17 1242  AST 29  ALT 41  ALKPHOS 525*  BILITOT 0.6  PROT 6.7  ALBUMIN 3.5   No results for input(s): LIPASE, AMYLASE in the last 168 hours. No results for input(s): AMMONIA in the last 168 hours. Coagulation Profile: No results for input(s): INR, PROTIME in the last 168 hours. Cardiac Enzymes: No results for input(s): CKTOTAL, CKMB, CKMBINDEX, TROPONINI in the last 168 hours. BNP (last 3 results) No results for input(s): PROBNP in the last 8760 hours. HbA1C: No results for input(s): HGBA1C in the last 72 hours. CBG: No results for input(s): GLUCAP in the last 168 hours. Lipid Profile: No results for input(s): CHOL, HDL, LDLCALC, TRIG, CHOLHDL, LDLDIRECT in the last 72 hours. Thyroid Function Tests: No results for  input(s): TSH, T4TOTAL, FREET4, T3FREE, THYROIDAB in the last 72 hours. Anemia Panel: No results for input(s): VITAMINB12, FOLATE, FERRITIN, TIBC, IRON, RETICCTPCT in the last 72 hours. Urine analysis:    Component Value Date/Time   COLORURINE YELLOW 05/24/2017 1021   APPEARANCEUR CLEAR 05/24/2017 1021   LABSPEC 1.011 05/24/2017 1021   PHURINE 6.0 05/24/2017 1021   GLUCOSEU NEGATIVE 05/24/2017 1021   HGBUR MODERATE (A) 05/24/2017 1021   BILIRUBINUR NEGATIVE 05/24/2017 1021   KETONESUR NEGATIVE 05/24/2017 1021   PROTEINUR NEGATIVE 05/24/2017 1021   NITRITE NEGATIVE 05/24/2017 1021   LEUKOCYTESUR NEGATIVE 05/24/2017 1021    Radiological Exams on Admission: Dg Chest 2 View  Result Date: 12/19/2017 CLINICAL DATA:  Shortness of breath. Reported history of lung carcinoma EXAM: CHEST - 2 VIEW COMPARISON:  Chest radiograph October 05, 2017 and PET-CT October 09, 2017 FINDINGS: There is patchy consolidation in the right lower lobe with small right pleural effusion. Lungs elsewhere are clear. Heart size and pulmonary vascular normal. No adenopathy evident. Port-A-Cath tip is in the superior vena  cava. No well-defined blastic or lytic bone lesions are evident by radiography. There is evidence of old trauma involving the lateral right clavicle. IMPRESSION: Consolidation right base. Underlying mass in this area cannot be excluded by radiography. Small right pleural effusion. Left lung clear. Heart size normal. No adenopathy appreciable by radiography. Port-A-Cath tip in superior vena cava. Electronically Signed   By: Lowella Grip III M.D.   On: 12/19/2017 13:05   Ct Angio Chest Pe W And/or Wo Contrast  Result Date: 12/19/2017 CLINICAL DATA:  Short of breath for 1 week. History of lung carcinoma diagnosed in February 2019. Currently on chemotherapy. EXAM: CT ANGIOGRAPHY CHEST WITH CONTRAST TECHNIQUE: Multidetector CT imaging of the chest was performed using the standard protocol during bolus administration of intravenous contrast. Multiplanar CT image reconstructions and MIPs were obtained to evaluate the vascular anatomy. CONTRAST:  146mL ISOVUE-370 IOPAMIDOL (ISOVUE-370) INJECTION 76% COMPARISON:  Current chest radiographs.  Prior chest CT, 10/05/2017. FINDINGS: Cardiovascular: There is satisfactory opacification of the pulmonary arteries to the segmental level. Study is somewhat degraded by respiratory motion, particularly affecting evaluation of the lower segmental and subsegmental pulmonary arteries. Allowing for the respiratory motion, there is no evidence of a pulmonary embolism. Heart is normal in size and configuration. No pericardial effusion. Mild left coronary artery calcifications. Aorta is normal in caliber. Mild descending thoracic aortic atherosclerotic disease. Mediastinum/Nodes: No neck base or axillary masses or adenopathy. No mediastinal masses or adenopathy. Left hilum is unremarkable. Soft tissue surrounds the right infrahilar structures. The bronchus intermedius is occluded. Soft tissue attenuation material surrounds the occluded bronchus intermedius and lower lobe bronchus  abutting the mediastinal pleura adjacent to the right heart. Lungs/Pleura: There is partial atelectasis of the right lower and middle lobes. Patchy peribronchovascular opacities are noted in the aerated portions of the right lower and middle lobes and in the left lower lobe, which may also be atelectasis. Superimposed pneumonia is possible. Stable changes of moderate centrilobular emphysema. No new masses or suspicious nodules. No pleural effusion or pneumothorax. Upper Abdomen: No acute findings. There is a biliary stent, partly imaged, new since the prior CT. Musculoskeletal: No fracture or acute finding. No osteoblastic or osteolytic lesions. Review of the MIP images confirms the above findings. IMPRESSION: 1. No evidence of a pulmonary embolism. 2. Atelectasis is noted in the right lower lobe and right middle lobe which is decreased in degree when compared to the prior CT. There is  still occlusion of the bronchus intermedius presumably from tumor, with surrounding soft tissue consistent with the known malignancy. This has also improved. At the level of the left atrium at the entrance of the inferior right pulmonary vein, the soft tissue currently measures 3.4 x 1.9 cm transversely, previously 3.5 x 2.4 cm. 3. There is patchy peribronchovascular type opacities in both lower lobes and in the right middle lobe, consent of atelectasis, superimposed pneumonia or a combination. 4. No other evidence of an acute abnormality. 5. Aortic atherosclerosis.  Emphysema. Aortic Atherosclerosis (ICD10-I70.0) and Emphysema (ICD10-J43.9). Electronically Signed   By: Lajean Manes M.D.   On: 12/19/2017 14:43    EKG: Independently reviewed. 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

## 2017-12-19 NOTE — ED Triage Notes (Signed)
Pt complains of shortness of breath for the past 3 days. Pt states she tried taking allegra and Claritin. Pt has hx of lung cancer, last had chemo 2 weeks ago.

## 2017-12-19 NOTE — ED Notes (Signed)
ED TO INPATIENT HANDOFF REPORT  Name/Age/Gender Carolan Shiver 67 y.o. female  Code Status Code Status History    Date Active Date Inactive Code Status Order ID Comments User Context   10/13/2017 1742 10/18/2017 1712 Full Code [id number]  Theodis Blaze, MD Inpatient      Home/SNF/Other Home  Chief Complaint shob  Level of Care/Admitting Diagnosis ED Disposition    ED Disposition Condition [facility] Area: [facility] [[id number]]  Level of Care: Med-Surg [16]  Diagnosis: HCAP (healthcare-associated pneumonia) [884166]  Admitting Physician: Velvet Bathe [[id number]]  Attending Physician: Velvet Bathe [phone]  Estimated length of stay: 3 - 4 days  Certification:: I certify there are rare and unusual circumstances requiring inpatient admission  PT Class (Do Not Modify): Inpatient [101]  PT Acc Code (Do Not Modify): Private [1]       Medical History Past Medical History:  Diagnosis Date  . Allergy   . Osteopenia     Allergies No Known Allergies  IV Location/Drains/Wounds Patient Lines/Drains/Airways Status   Active Line/Drains/Airways    Name:   Placement date:   Placement time:   Site:   Days:   Implanted Port 11/10/17 Right Chest   11/10/17    -    Chest   39   Peripheral IV 12/19/17 Right Antecubital   12/19/17    1243    Antecubital   less than 1   GI Stent 4 Fr.   10/15/17    1312    -   65          Labs/Imaging Results for orders placed or performed during the hospital encounter of 12/19/17 (from the past 48 hour(s))  CBC with Differential/Platelet     Status: Abnormal   Collection Time: 12/19/17 12:42 PM  Result Value Ref Range   WBC 17.6 (H) 4.0 - 10.5 K/uL   RBC 3.39 (L) 3.87 - 5.11 MIL/uL   Hemoglobin 10.7 (L) 12.0 - 15.0 g/dL   HCT 33.3 (L) 36.0 - 46.0 %   MCV 98.2 78.0 - 100.0 fL   MCH 31.6 26.0 - 34.0 pg   MCHC 32.1 30.0 - 36.0 g/dL   RDW 21.1 (H) 11.5 - 15.5 %   Platelets 181 150 - 400 K/uL   Neutrophils  Relative % 76 %   Lymphocytes Relative 12 %   Monocytes Relative 12 %   Eosinophils Relative 0 %   Basophils Relative 0 %   nRBC 2 (H) 0 /100 WBC   Neutro Abs 13.4 (H) 1.7 - 7.7 K/uL   Lymphs Abs 2.1 0.7 - 4.0 K/uL   Monocytes Absolute 2.1 (H) 0.1 - 1.0 K/uL   Eosinophils Absolute 0.0 0.0 - 0.7 K/uL   Basophils Absolute 0.0 0.0 - 0.1 K/uL   RBC Morphology POLYCHROMASIA PRESENT     Comment: RARE NRBCs   WBC Morphology MILD LEFT SHIFT (1-5% METAS, OCC MYELO, OCC BANDS)     Comment: TOXIC GRANULATION Performed at [facility], [place] [address] [place], [place] [address]   Comprehensive metabolic panel     Status: Abnormal   Collection Time: 12/19/17 12:42 PM  Result Value Ref Range   Sodium 133 (L) 135 - 145 mmol/L   Potassium 3.8 3.5 - 5.1 mmol/L   Chloride 99 (L) 101 - 111 mmol/L   CO2 23 22 - 32 mmol/L   Glucose, Bld 129 (H) 65 - 99 mg/dL   BUN 10 6 -  20 mg/dL   Creatinine, Ser 0.44 0.44 - 1.00 mg/dL   Calcium 7.9 (L) 8.9 - 10.3 mg/dL   Total Protein 6.7 6.5 - 8.1 g/dL   Albumin 3.5 3.5 - 5.0 g/dL   AST 29 15 - 41 U/L   ALT 41 14 - 54 U/L   Alkaline Phosphatase 525 (H) 38 - 126 U/L   Total Bilirubin 0.6 0.3 - 1.2 mg/dL   GFR calc non Af Amer >60 >60 mL/min   GFR calc Af Amer >60 >60 mL/min    Comment: (NOTE) The eGFR has been calculated using the CKD EPI equation. This calculation has not been validated in all clinical situations. eGFR's persistently <60 mL/min signify possible Chronic Kidney Disease.    Anion gap 11 5 - 15    Comment: Performed at [facility], [place] [address] [place], [place] [address]  I-stat troponin, ED     Status: None   Collection Time: 12/19/17 12:56 PM  Result Value Ref Range   Troponin i, poc 0.03 0.00 - 0.08 ng/mL   Comment 3            Comment: Due to the release kinetics of cTnI, a negative result within the first hours of the onset of symptoms does not rule out myocardial infarction with  certainty. If myocardial infarction is still suspected, repeat the test at appropriate intervals.   I-Stat CG4 Lactic Acid, ED     Status: Abnormal   Collection Time: 12/19/17  1:46 PM  Result Value Ref Range   Lactic Acid, Venous 2.10 (HH) 0.5 - 1.9 mmol/L   Comment NOTIFIED PHYSICIAN   I-Stat CG4 Lactic Acid, ED     Status: Abnormal   Collection Time: 12/19/17  3:32 PM  Result Value Ref Range   Lactic Acid, Venous 3.54 (HH) 0.5 - 1.9 mmol/L   Comment NOTIFIED PHYSICIAN    Dg Chest 2 View  Result Date: 12/19/2017 CLINICAL DATA:  Shortness of breath. Reported history of lung carcinoma EXAM: CHEST - 2 VIEW COMPARISON:  Chest radiograph October 05, 2017 and PET-CT October 09, 2017 FINDINGS: There is patchy consolidation in the right lower lobe with small right pleural effusion. Lungs elsewhere are clear. Heart size and pulmonary vascular normal. No adenopathy evident. Port-A-Cath tip is in the superior vena cava. No well-defined blastic or lytic bone lesions are evident by radiography. There is evidence of old trauma involving the lateral right clavicle. IMPRESSION: Consolidation right base. Underlying mass in this area cannot be excluded by radiography. Small right pleural effusion. Left lung clear. Heart size normal. No adenopathy appreciable by radiography. Port-A-Cath tip in superior vena cava. Electronically Signed   By: Lowella Grip III M.D.   On: 12/19/2017 13:05   Ct Angio Chest Pe W And/or Wo Contrast  Result Date: 12/19/2017 CLINICAL DATA:  Short of breath for 1 week. History of lung carcinoma diagnosed in February 2019. Currently on chemotherapy. EXAM: CT ANGIOGRAPHY CHEST WITH CONTRAST TECHNIQUE: Multidetector CT imaging of the chest was performed using the standard protocol during bolus administration of intravenous contrast. Multiplanar CT image reconstructions and MIPs were obtained to evaluate the vascular anatomy. CONTRAST:  175m ISOVUE-370 IOPAMIDOL (ISOVUE-370) INJECTION  76% COMPARISON:  Current chest radiographs.  Prior chest CT, 10/05/2017. FINDINGS: Cardiovascular: There is satisfactory opacification of the pulmonary arteries to the segmental level. Study is somewhat degraded by respiratory motion, particularly affecting evaluation of the lower segmental and subsegmental pulmonary arteries. Allowing for the respiratory motion, there is  no evidence of a pulmonary embolism. Heart is normal in size and configuration. No pericardial effusion. Mild left coronary artery calcifications. Aorta is normal in caliber. Mild descending thoracic aortic atherosclerotic disease. Mediastinum/Nodes: No neck base or axillary masses or adenopathy. No mediastinal masses or adenopathy. Left hilum is unremarkable. Soft tissue surrounds the right infrahilar structures. The bronchus intermedius is occluded. Soft tissue attenuation material surrounds the occluded bronchus intermedius and lower lobe bronchus abutting the mediastinal pleura adjacent to the right heart. Lungs/Pleura: There is partial atelectasis of the right lower and middle lobes. Patchy peribronchovascular opacities are noted in the aerated portions of the right lower and middle lobes and in the left lower lobe, which may also be atelectasis. Superimposed pneumonia is possible. Stable changes of moderate centrilobular emphysema. No new masses or suspicious nodules. No pleural effusion or pneumothorax. Upper Abdomen: No acute findings. There is a biliary stent, partly imaged, new since the prior CT. Musculoskeletal: No fracture or acute finding. No osteoblastic or osteolytic lesions. Review of the MIP images confirms the above findings. IMPRESSION: 1. No evidence of a pulmonary embolism. 2. Atelectasis is noted in the right lower lobe and right middle lobe which is decreased in degree when compared to the prior CT. There is still occlusion of the bronchus intermedius presumably from tumor, with surrounding soft tissue consistent with the  known malignancy. This has also improved. At the level of the left atrium at the entrance of the inferior right pulmonary vein, the soft tissue currently measures 3.4 x 1.9 cm transversely, previously 3.5 x 2.4 cm. 3. There is patchy peribronchovascular type opacities in both lower lobes and in the right middle lobe, consent of atelectasis, superimposed pneumonia or a combination. 4. No other evidence of an acute abnormality. 5. Aortic atherosclerosis.  Emphysema. Aortic Atherosclerosis (ICD10-I70.0) and Emphysema (ICD10-J43.9). Electronically Signed   By: Lajean Manes M.D.   On: 12/19/2017 14:43    Pending Labs Unresulted Labs (From admission, onward)   Start     Ordered   12/19/17 1311  Blood culture (routine x 2)  BLOOD CULTURE X 2,   STAT     12/19/17 1310   Signed and Held  Culture, blood (routine x 2) Call MD if unable to obtain prior to antibiotics being given  BLOOD CULTURE X 2,   R    Comments:  If blood cultures drawn in Emergency Department - Do not draw and cancel order   Question:  Patient immune status  Answer:  Immunocompromised   Signed and Held   Signed and Held  Culture, sputum-assessment  Once,   R    Question:  Patient immune status  Answer:  Immunocompromised   Signed and Held   Signed and Held  Gram stain  Once,   R    Question:  Patient immune status  Answer:  Immunocompromised   Signed and Held   Signed and Held  HIV antibody (Routine Screening)  Once,   R     Signed and Held   Signed and Held  Strep pneumoniae urinary antigen  Once,   R     Signed and Held   Signed and Held  CBC  (heparin)  Once,   R    Comments:  Baseline for heparin therapy IF NOT ALREADY DRAWN.  Notify MD if PLT < 100 K.    Signed and Held   Signed and Held  Creatinine, serum  (heparin)  Once,   R    Comments:  Baseline  for heparin therapy IF NOT ALREADY DRAWN.    Signed and Held      Vitals/Pain Today's Vitals   12/19/17 1649 12/19/17 1700 12/19/17 1730 12/19/17 1800  BP: 116/81  97/80 112/77 116/79  Pulse: 85 81 84 83  Resp: 16 20 19  (!) 25  Temp:      TempSrc:      SpO2: 92% 92% 90% 91%  PainSc:        Isolation Precautions No active isolations  Medications Medications  iopamidol (ISOVUE-370) 76 % injection (has no administration in time range)  vancomycin (VANCOCIN) IVPB 1000 mg/200 mL premix (has no administration in time range)  ceFEPIme (MAXIPIME) 2 g in sodium chloride 0.9 % 100 mL IVPB (has no administration in time range)  albuterol (PROVENTIL) (2.5 MG/3ML) 0.083% nebulizer solution 5 mg (5 mg Nebulization Given 12/19/17 1231)  methylPREDNISolone sodium succinate (SOLU-MEDROL) 125 mg/2 mL injection 125 mg (125 mg Intravenous Given 12/19/17 1330)  albuterol (PROVENTIL) (2.5 MG/3ML) 0.083% nebulizer solution 5 mg (5 mg Nebulization Given 12/19/17 1330)  iopamidol (ISOVUE-370) 76 % injection 100 mL (100 mLs Intravenous Contrast Given 12/19/17 1418)  vancomycin (VANCOCIN) IVPB 1000 mg/200 mL premix (0 mg Intravenous Stopped 12/19/17 1615)  piperacillin-tazobactam (ZOSYN) IVPB 3.375 g (0 g Intravenous Stopped 12/19/17 1556)    Mobility walks with person assist

## 2017-12-19 NOTE — Telephone Encounter (Signed)
Patient calling to say she is extremely SOB, difficulty breathing. Called patient back and  Instructed   her to report to the emergency room.

## 2017-12-19 NOTE — Progress Notes (Signed)
Pharmacy Antibiotic Note  Haley [name] is a 67 y.o. female with hx lung cancer currently undergoing chemotherapy treatment, presented to the ED on 12/19/2017 with c/o SOB. Chest CTA neg for PE but showed findings with concern for PNA.  To start vancomycin and cefepime for PNA.  - scr 0.44, LA 3.54, ANC high - weight 52 kg  Plan: - vancomycin 1000 mg IV q24h - changed cefepime to 2 gm IV q8h for PNA indication - monitor renal function closely _____________________________________     Temp (24hrs), Avg:97.6 F (36.4 C), Min:97.6 F (36.4 C), Max:97.6 F (36.4 C)  Recent Labs  Lab 12/19/17 1242 12/19/17 1346 12/19/17 1532  WBC 17.6*  --   --   CREATININE 0.44  --   --   LATICACIDVEN  --  2.10* 3.54*    CrCl cannot be calculated (Unknown ideal weight.).    No Known Allergies   Thank you for allowing pharmacy to be a part of this patient's care.  Lynelle Doctor 12/19/2017 5:11 PM

## 2017-12-19 NOTE — ED Provider Notes (Signed)
[facility] DEPT Provider Note   CSN: [id number] Arrival date & time: 12/19/17  1147     History   Chief Complaint Chief Complaint  Patient presents with  . Shortness of Breath    HPI Haley [name] is a 67 y.o. female history of small cell lung cancer with chemo about a week ago, here presenting with shortness of breath.  Patient states that she has progressively worsening shortness of breath for the last week or so.  Shortness of breath is worse with exertion.  Patient has some nonproductive cough but denies any fevers at home.  Denies any leg swelling or history of blood clots.  Patient called her oncologist and was sent here for further evaluation.   The history is provided by the patient.    Past Medical History:  Diagnosis Date  . Allergy   . Osteopenia     Patient Active Problem List   Diagnosis Date Noted  . 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 Bone metastasis ([place]) 10/22/2017  . Counseling regarding advanced care planning and goals of care 10/22/2017  . Liver metastases ([place])   . 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 Metastatic disease ([place])   . Jaundice 10/13/2017  . Mood disorder ([place]) 05/12/2017  . Osteoarthritis 05/12/2017  . Osteopenia 05/06/2016  . Eustachian tube dysfunction 07/03/2015  . Allergic rhinitis 07/03/2015  . Tobacco use disorder 07/03/2015  . Hypertriglyceridemia 07/03/2015    Past Surgical History:  Procedure Laterality Date  . COLONOSCOPY    . COLONOSCOPY WITH PROPOFOL N/A 10/25/2014   Procedure: COLONOSCOPY WITH PROPOFOL;  Surgeon: Garlan Fair, MD;  Location: [facility] ENDOSCOPY;  Service: Endoscopy;  Laterality: N/A;  . ERCP N/A 10/15/2017   Procedure: ENDOSCOPIC RETROGRADE CHOLANGIOPANCREATOGRAPHY (ERCP);  Surgeon: Clarene Essex, MD;  Location: [facility];  Service: Endoscopy;  Laterality: N/A;  . IR FLUORO GUIDE PORT INSERTION RIGHT  11/10/2017  . IR RADIOLOGIST EVAL  & MGMT  10/02/2017  . IR US GUIDE VASC ACCESS RIGHT  11/10/2017     OB History   None      Home Medications    Prior to Admission medications   Medication Sig Start Date End Date Taking? Authorizing Provider  calcium carbonate (OS-CAL - DOSED IN MG OF ELEMENTAL CALCIUM) 1250 (500 Ca) MG tablet Take 1 tablet by mouth 2 (two) times daily with a meal.   Yes [provider]  dexamethasone (DECADRON) 4 MG tablet Take 1 tablet (4 mg total) by mouth daily with breakfast. 12/05/17  Yes Brunetta Genera, MD  fexofenadine (ALLEGRA) 60 MG tablet Take 60 mg by mouth daily as needed for allergies or rhinitis.   Yes [provider]  fluconazole (DIFLUCAN) 100 MG tablet Take 1 tablet (100 mg total) by mouth daily. On all days except for 5 days during chemotherapy and until neulasta shot. 12/09/17  Yes Brunetta Genera, MD  lidocaine-prilocaine (EMLA) cream Apply to affected area once 10/22/17  Yes Brunetta Genera, MD  LORazepam (ATIVAN) 0.5 MG tablet Take 1 tablet (0.5 mg total) by mouth every 6 (six) hours as needed (Nausea or vomiting). 10/22/17  Yes Brunetta Genera, MD  magic mouthwash w/lidocaine SOLN Take 10 mLs by mouth 4 (four) times daily as needed for mouth pain. Rinse and spit or swallow 12/08/17  Yes Tanner, Lyndon Code., PA-C  Multiple Vitamins-Minerals (MULTIVITAMIN WITH MINERALS) tablet Take 1 tablet by mouth daily.   Yes [provider]  ondansetron (ZOFRAN) 4 MG tablet Take 1 tablet (4 mg total) by mouth every 8 (eight) hours as needed for nausea or vomiting. 10/18/17  Yes Bonnielee Haff, MD  oxyCODONE (OXYCONTIN) 15 mg 12 hr tablet Take 1 tablet (15 mg total) by mouth every 12 (twelve) hours. 12/05/17  Yes Brunetta Genera, MD  polyethylene glycol [facility]) packet Take 17 g by mouth daily as needed for moderate constipation. 10/18/17  Yes Bonnielee Haff, MD  prochlorperazine (COMPAZINE) 10 MG tablet Take 1 tablet (10 mg total) by mouth every 6 (six) hours  as needed (Nausea or vomiting). 10/22/17  Yes Brunetta Genera, MD  senna-docusate (SENNA S) 8.6-50 MG tablet Take 2 tablets by mouth at bedtime. 09/30/17  Yes Brunetta Genera, MD  oxyCODONE (OXY IR/ROXICODONE) 5 MG immediate release tablet Take 1-2 tablets (5-10 mg total) by mouth every 4 (four) hours as needed for severe pain. Patient not taking: Reported on 12/19/2017 10/22/17   Brunetta Genera, MD  tiZANidine (ZANAFLEX) 4 MG tablet Take 1 tablet (4 mg total) by mouth every 8 (eight) hours as needed for muscle spasms. Patient not taking: Reported on 12/19/2017 10/18/17   Bonnielee Haff, MD    Family History Family History  Problem Relation Age of Onset  . Diabetes Mother   . Hypertension Mother   . Diabetes Sister   . Hypertension Sister   . Hyperlipidemia Sister     Social History Social History   Tobacco Use  . Smoking status: Former Smoker    Packs/day: 1.00    Years: 40.00    Pack years: 40.00    Types: Cigarettes    Last attempt to quit: 09/24/2017    Years since quitting: 0.2  . Smokeless tobacco: Never Used  Substance Use Topics  . Alcohol use: Yes    Comment: rare social  . Drug use: No     Allergies   Patient has no known allergies.   Review of Systems Review of Systems  Respiratory: Positive for shortness of breath.   All other systems reviewed and are negative.    Physical Exam Updated Vital Signs BP 116/71 (BP Location: Right Arm)   Pulse 100   Temp 97.6 F (36.4 C) (Oral)   Resp (!) 23   SpO2 (!) 89%   Physical Exam  Constitutional: She is oriented to person, place, and time.  Chronically ill   HENT:  Head: Normocephalic.  Mouth/Throat: Oropharynx is clear and moist.  Eyes: Pupils are equal, round, and reactive to light. EOM are normal.  Neck: Normal range of motion.  Cardiovascular: Normal rate.  Pulmonary/Chest:  Tachypneic, crackles R base, diffuse wheezing and mild retractions as well   Abdominal: Soft. Bowel sounds are  normal.  Musculoskeletal: Normal range of motion.       Right lower leg: Normal.       Left lower leg: Normal.  Neurological: She is alert and oriented to person, place, and time.  Skin: Skin is warm. Capillary refill takes less than 2 seconds.  Psychiatric: She has a normal mood and affect. Her behavior is normal.  Nursing note and vitals reviewed.    ED Treatments / Results  Labs (all labs ordered are listed, but only abnormal results are displayed) Labs Reviewed  CBC WITH DIFFERENTIAL/PLATELET - Abnormal; Notable for the following components:      Result Value   WBC 17.6 (*)    RBC 3.39 (*)    Hemoglobin 10.7 (*)  HCT 33.3 (*)    RDW 21.1 (*)    nRBC 2 (*)    Neutro Abs 13.4 (*)    Monocytes Absolute 2.1 (*)    All other components within normal limits  COMPREHENSIVE METABOLIC PANEL - Abnormal; Notable for the following components:   Sodium 133 (*)    Chloride 99 (*)    Glucose, Bld 129 (*)    Calcium 7.9 (*)    Alkaline Phosphatase 525 (*)    All other components within normal limits  I-STAT CG4 LACTIC ACID, ED - Abnormal; Notable for the following components:   Lactic Acid, Venous 2.10 (*)    All other components within normal limits  CULTURE, BLOOD (ROUTINE X 2)  CULTURE, BLOOD (ROUTINE X 2)  I-STAT TROPONIN, ED  I-STAT CG4 LACTIC ACID, ED    EKG None  Radiology Dg Chest 2 View  Result Date: 12/19/2017 CLINICAL DATA:  Shortness of breath. Reported history of lung carcinoma EXAM: CHEST - 2 VIEW COMPARISON:  Chest radiograph October 05, 2017 and PET-CT October 09, 2017 FINDINGS: There is patchy consolidation in the right lower lobe with small right pleural effusion. Lungs elsewhere are clear. Heart size and pulmonary vascular normal. No adenopathy evident. Port-A-Cath tip is in the superior vena cava. No well-defined blastic or lytic bone lesions are evident by radiography. There is evidence of old trauma involving the lateral right clavicle. IMPRESSION:  Consolidation right base. Underlying mass in this area cannot be excluded by radiography. Small right pleural effusion. Left lung clear. Heart size normal. No adenopathy appreciable by radiography. Port-A-Cath tip in superior vena cava. Electronically Signed   By: Lowella Grip III M.D.   On: 12/19/2017 13:05   Ct Angio Chest Pe W And/or Wo Contrast  Result Date: 12/19/2017 CLINICAL DATA:  Short of breath for 1 week. History of lung carcinoma diagnosed in February 2019. Currently on chemotherapy. EXAM: CT ANGIOGRAPHY CHEST WITH CONTRAST TECHNIQUE: Multidetector CT imaging of the chest was performed using the standard protocol during bolus administration of intravenous contrast. Multiplanar CT image reconstructions and MIPs were obtained to evaluate the vascular anatomy. CONTRAST:  17mL ISOVUE-370 IOPAMIDOL (ISOVUE-370) INJECTION 76% COMPARISON:  Current chest radiographs.  Prior chest CT, 10/05/2017. FINDINGS: Cardiovascular: There is satisfactory opacification of the pulmonary arteries to the segmental level. Study is somewhat degraded by respiratory motion, particularly affecting evaluation of the lower segmental and subsegmental pulmonary arteries. Allowing for the respiratory motion, there is no evidence of a pulmonary embolism. Heart is normal in size and configuration. No pericardial effusion. Mild left coronary artery calcifications. Aorta is normal in caliber. Mild descending thoracic aortic atherosclerotic disease. Mediastinum/Nodes: No neck base or axillary masses or adenopathy. No mediastinal masses or adenopathy. Left hilum is unremarkable. Soft tissue surrounds the right infrahilar structures. The bronchus intermedius is occluded. Soft tissue attenuation material surrounds the occluded bronchus intermedius and lower lobe bronchus abutting the mediastinal pleura adjacent to the right heart. Lungs/Pleura: There is partial atelectasis of the right lower and middle lobes. Patchy peribronchovascular  opacities are noted in the aerated portions of the right lower and middle lobes and in the left lower lobe, which may also be atelectasis. Superimposed pneumonia is possible. Stable changes of moderate centrilobular emphysema. No new masses or suspicious nodules. No pleural effusion or pneumothorax. Upper Abdomen: No acute findings. There is a biliary stent, partly imaged, new since the prior CT. Musculoskeletal: No fracture or acute finding. No osteoblastic or osteolytic lesions. Review of the MIP images confirms  the above findings. IMPRESSION: 1. No evidence of a pulmonary embolism. 2. Atelectasis is noted in the right lower lobe and right middle lobe which is decreased in degree when compared to the prior CT. There is still occlusion of the bronchus intermedius presumably from tumor, with surrounding soft tissue consistent with the known malignancy. This has also improved. At the level of the left atrium at the entrance of the inferior right pulmonary vein, the soft tissue currently measures 3.4 x 1.9 cm transversely, previously 3.5 x 2.4 cm. 3. There is patchy peribronchovascular type opacities in both lower lobes and in the right middle lobe, consent of atelectasis, superimposed pneumonia or a combination. 4. No other evidence of an acute abnormality. 5. Aortic atherosclerosis.  Emphysema. Aortic Atherosclerosis (ICD10-I70.0) and Emphysema (ICD10-J43.9). Electronically Signed   By: Lajean Manes M.D.   On: 12/19/2017 14:43    Procedures Procedures (including critical care time)  Medications Ordered in ED Medications  iopamidol (ISOVUE-370) 76 % injection (has no administration in time range)  vancomycin (VANCOCIN) IVPB 1000 mg/200 mL premix (has no administration in time range)  piperacillin-tazobactam (ZOSYN) IVPB 3.375 g (3.375 g Intravenous New Bag/Given 12/19/17 1522)  albuterol (PROVENTIL) (2.5 MG/3ML) 0.083% nebulizer solution 5 mg (5 mg Nebulization Given 12/19/17 1231)  methylPREDNISolone  sodium succinate (SOLU-MEDROL) 125 mg/2 mL injection 125 mg (125 mg Intravenous Given 12/19/17 1330)  albuterol (PROVENTIL) (2.5 MG/3ML) 0.083% nebulizer solution 5 mg (5 mg Nebulization Given 12/19/17 1330)  iopamidol (ISOVUE-370) 76 % injection 100 mL (100 mLs Intravenous Contrast Given 12/19/17 1418)     Initial Impression / Assessment and Plan / ED Course  I have reviewed the triage vital signs and the nursing notes.  Pertinent labs & imaging results that were available during my care of the patient were reviewed by me and considered in my medical decision making (see chart for details).     Haley [name] is a 67 y.o. female hx of lung cancer here with SOB. Patient hypoxic to 88 % on RA. Not on oxygen at home. Consider pneumonia vs PE vs worsening cancer. Will get labs, CXR, CTA chest.   3:24 PM O2 88-89% on RA. CXR showed possible pneumonia. CTA showed multi focal pneumonia. WBC 17, Lactate 2.1. Given vanc/zosyn (she was admitted in Feb and was diagnosed with lung cancer then). Will admit for HCAP.    Final Clinical Impressions(s) / ED Diagnoses   Final diagnoses:  None    ED Discharge Orders    None       Drenda Freeze, MD 12/19/17 1527

## 2017-12-19 NOTE — Progress Notes (Signed)
Pts lactic acid is 3.54. MD paged and waiting on orders for fluids.

## 2017-12-20 DIAGNOSIS — C3490 Malignant neoplasm of unspecified part of unspecified bronchus or lung: Secondary | ICD-10-CM

## 2017-12-20 DIAGNOSIS — C799 Secondary malignant neoplasm of unspecified site: Secondary | ICD-10-CM

## 2017-12-20 LAB — CBC
HEMATOCRIT: 32.9 % — AB (ref 36.0–46.0)
Hemoglobin: 10.4 g/dL — ABNORMAL LOW (ref 12.0–15.0)
MCH: 31.1 pg (ref 26.0–34.0)
MCHC: 31.6 g/dL (ref 30.0–36.0)
MCV: 98.5 fL (ref 78.0–100.0)
PLATELETS: 176 10*3/uL (ref 150–400)
RBC: 3.34 MIL/uL — AB (ref 3.87–5.11)
RDW: 21.6 % — AB (ref 11.5–15.5)
WBC: 20.3 10*3/uL — ABNORMAL HIGH (ref 4.0–10.5)

## 2017-12-20 LAB — COMPREHENSIVE METABOLIC PANEL
ALBUMIN: 3.2 g/dL — AB (ref 3.5–5.0)
ALT: 43 U/L (ref 14–54)
AST: 31 U/L (ref 15–41)
Alkaline Phosphatase: 470 U/L — ABNORMAL HIGH (ref 38–126)
Anion gap: 11 (ref 5–15)
BILIRUBIN TOTAL: 0.5 mg/dL (ref 0.3–1.2)
BUN: 9 mg/dL (ref 6–20)
CHLORIDE: 102 mmol/L (ref 101–111)
CO2: 22 mmol/L (ref 22–32)
CREATININE: 0.51 mg/dL (ref 0.44–1.00)
Calcium: 7.3 mg/dL — ABNORMAL LOW (ref 8.9–10.3)
GFR calc Af Amer: >60 mL/min (ref >60)
GLUCOSE: 227 mg/dL — AB (ref 65–99)
POTASSIUM: 4.6 mmol/L (ref 3.5–5.1)
Sodium: 135 mmol/L (ref 135–145)
Total Protein: 6.6 g/dL (ref 6.5–8.1)

## 2017-12-20 LAB — MAGNESIUM: Magnesium: 2.1 mg/dL (ref 1.7–2.4)

## 2017-12-20 LAB — HIV ANTIBODY (ROUTINE TESTING W REFLEX): HIV Screen 4th Generation wRfx: NONREACTIVE

## 2017-12-20 MED ORDER — ADULT MULTIVITAMIN W/MINERALS CH
1.0000 | ORAL_TABLET | Freq: Every day | ORAL | Status: DC
  Administered 2017-12-20 – 2017-12-22 (×3): 1 via ORAL
  Filled 2017-12-20 (×3): qty 1

## 2017-12-20 NOTE — Progress Notes (Signed)
Patient ID: Haley [name], female   DOB: Jul 19, 1951, 67 y.o.   MRN: [id number]  PROGRESS NOTE    Haley [name]  HKV:[id number] DOB: [birth year] DOA: 12/19/2017 PCP: Alycia Rossetti, MD   Brief Narrative:  67 year old female with history of small cell lung cancer with metastases on chemotherapy Presented with worsening shortness of breath.  She was admitted with probable healthcare associated pneumonia and started on antibiotics.  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  Follow cultures.  Oxygen supplementation as needed -Incentive spirometry  Leukocytosis -Probably secondary to above.  Repeat a.m. labs.  Continue antibiotics  Small cell lung cancer with metastases on chemotherapy -Outpatient follow-up with oncology/Dr. Irene Limbo  DVT prophylaxis: Heparin Code Status: Full Family Communication: None at bedside Disposition Plan: Home in 1 to 2 days pending clinical improvement  Consultants: None  Procedures: None  Antimicrobials: Cefepime and vancomycin from 12/19/17  Subjective: Patient seen and examined at bedside.  She feels that her breathing is better.  No overnight fever, nausea, vomiting.  She is still coughing Objective: Vitals:   12/19/17 1730 12/19/17 1800 12/19/17 1847 12/20/17 0505  BP: 112/77 116/79 124/78 123/72  Pulse: 84 83 92 70  Resp: 19 (!) 25 18 18   Temp:   97.8 F (36.6 C) 98.5 F (36.9 C)  TempSrc:    Oral  SpO2: 90% 91% 93% 94%    Intake/Output Summary (Last 24 hours) at 12/20/2017 1218 Last data filed at 12/20/2017 0900 Gross per 24 hour  Intake 888.75 ml  Output -  Net 888.75 ml   There were no vitals filed for this visit.  Examination:  General exam: Appears calm and  comfortable  Respiratory system: Bilateral decreased breath sound at bases Cardiovascular system: S1 & S2 heard, rate controlled  gastrointestinal system: Abdomen is nondistended, soft and nontender. Normal bowel sounds heard. Extremities: No cyanosis, clubbing, edema   Data Reviewed: I have personally reviewed following labs and imaging studies  CBC: Recent Labs  Lab 12/19/17 1242 12/20/17 0803  WBC 17.6* 20.3*  NEUTROABS 13.4*  --   HGB 10.7* 10.4*  HCT 33.3* 32.9*  MCV 98.2 98.5  PLT 181 564   Basic Metabolic Panel: Recent Labs  Lab 12/19/17 1242 12/20/17 0803  NA 133* 135  K 3.8 4.6  CL 99* 102  CO2 23 22  GLUCOSE 129* 227*  BUN 10 9  CREATININE 0.44 0.51  CALCIUM 7.9* 7.3*  MG  --  2.1   GFR: CrCl cannot be calculated (Unknown ideal weight.). Liver Function Tests: Recent Labs  Lab 12/19/17 1242 12/20/17 0803  AST 29 31  ALT 41 43  ALKPHOS 525* 470*  BILITOT 0.6 0.5  PROT 6.7 6.6  ALBUMIN 3.5 3.2*   No results for input(s): LIPASE, AMYLASE in the last 168 hours. No results for input(s): AMMONIA in the last 168 hours. Coagulation Profile: No results for input(s): INR, PROTIME in the last 168 hours. Cardiac Enzymes: No results for input(s): CKTOTAL, CKMB, CKMBINDEX, TROPONINI in the last 168 hours. BNP (last 3 results) No results for input(s): PROBNP in the last 8760 hours. HbA1C: No results for input(s): HGBA1C in the last 72 hours. CBG: No results for input(s): GLUCAP in the last 168 hours. Lipid Profile: No results  for input(s): CHOL, HDL, LDLCALC, TRIG, CHOLHDL, LDLDIRECT in the last 72 hours. Thyroid Function Tests: No results for input(s): TSH, T4TOTAL, FREET4, T3FREE, THYROIDAB in the last 72 hours. Anemia Panel: No results for input(s): VITAMINB12, FOLATE, FERRITIN, TIBC, IRON, RETICCTPCT in the last 72 hours. Sepsis Labs: Recent Labs  Lab 12/19/17 1346 12/19/17 1532  LATICACIDVEN 2.10* 3.54*    Recent Results (from the past 240  hour(s))  Blood culture (routine x 2)     Status: None (Preliminary result)   Collection Time: 12/19/17  1:11 PM  Result Value Ref Range Status   Specimen Description   Final    BLOOD RIGHT HAND Performed at [facility] [address] [place], [place] [address]    Special Requests   Final    BOTTLES DRAWN AEROBIC AND ANAEROBIC Blood Culture adequate volume Performed at [facility] [address] [place], [place] [address]    Culture   Final    NO GROWTH < 24 HOURS Performed at [facility] [address] [place], [place] [address]    Report Status PENDING  Incomplete  Blood culture (routine x 2)     Status: None (Preliminary result)   Collection Time: 12/19/17  1:16 PM  Result Value Ref Range Status   Specimen Description   Final    BLOOD RIGHT ANTECUBITAL Performed at [facility] [address] [place], [place] [address]    Special Requests   Final    BOTTLES DRAWN AEROBIC AND ANAEROBIC Blood Culture adequate volume Performed at [facility] [address] [place], [place] [address]    Culture   Final    NO GROWTH < 24 HOURS Performed at [facility] [address] [place], [place] [address]    Report Status PENDING  Incomplete         Radiology Studies: Dg Chest 2 View  Result Date: 12/19/2017 CLINICAL DATA:  Shortness of breath. Reported history of lung carcinoma EXAM: CHEST - 2 VIEW COMPARISON:  Chest radiograph October 05, 2017 and PET-CT October 09, 2017 FINDINGS: There is patchy consolidation in the right lower lobe with small right pleural effusion. Lungs elsewhere are clear. Heart size and pulmonary vascular normal. No adenopathy evident. Port-A-Cath tip is in the superior vena cava. No well-defined blastic or lytic bone lesions are evident by radiography. There is evidence of old trauma involving the lateral right clavicle. IMPRESSION: Consolidation right base.  Underlying mass in this area cannot be excluded by radiography. Small right pleural effusion. Left lung clear. Heart size normal. No adenopathy appreciable by radiography. Port-A-Cath tip in superior vena cava. Electronically Signed   By: Lowella Grip III M.D.   On: 12/19/2017 13:05   Ct Angio Chest Pe W And/or Wo Contrast  Result Date: 12/19/2017 CLINICAL DATA:  Short of breath for 1 week. History of lung carcinoma diagnosed in February 2019. Currently on chemotherapy. EXAM: CT ANGIOGRAPHY CHEST WITH CONTRAST TECHNIQUE: Multidetector CT imaging of the chest was performed using the standard protocol during bolus administration of intravenous contrast. Multiplanar CT image reconstructions and MIPs were obtained to evaluate the vascular anatomy. CONTRAST:  127mL ISOVUE-370 IOPAMIDOL (ISOVUE-370) INJECTION 76% COMPARISON:  Current chest radiographs.  Prior chest CT, 10/05/2017. FINDINGS: Cardiovascular: There is satisfactory opacification of the pulmonary arteries to the segmental level. Study is somewhat degraded by respiratory motion, particularly affecting evaluation of the lower segmental and subsegmental pulmonary arteries. Allowing for the respiratory motion,  there is no evidence of a pulmonary embolism. Heart is normal in size and configuration. No pericardial effusion. Mild left coronary artery calcifications. Aorta is normal in caliber. Mild descending thoracic aortic atherosclerotic disease. Mediastinum/Nodes: No neck base or axillary masses or adenopathy. No mediastinal masses or adenopathy. Left hilum is unremarkable. Soft tissue surrounds the right infrahilar structures. The bronchus intermedius is occluded. Soft tissue attenuation material surrounds the occluded bronchus intermedius and lower lobe bronchus abutting the mediastinal pleura adjacent to the right heart. Lungs/Pleura: There is partial atelectasis of the right lower and middle lobes. Patchy peribronchovascular opacities are noted in  the aerated portions of the right lower and middle lobes and in the left lower lobe, which may also be atelectasis. Superimposed pneumonia is possible. Stable changes of moderate centrilobular emphysema. No new masses or suspicious nodules. No pleural effusion or pneumothorax. Upper Abdomen: No acute findings. There is a biliary stent, partly imaged, new since the prior CT. Musculoskeletal: No fracture or acute finding. No osteoblastic or osteolytic lesions. Review of the MIP images confirms the above findings. IMPRESSION: 1. No evidence of a pulmonary embolism. 2. Atelectasis is noted in the right lower lobe and right middle lobe which is decreased in degree when compared to the prior CT. There is still occlusion of the bronchus intermedius presumably from tumor, with surrounding soft tissue consistent with the known malignancy. This has also improved. At the level of the left atrium at the entrance of the inferior right pulmonary vein, the soft tissue currently measures 3.4 x 1.9 cm transversely, previously 3.5 x 2.4 cm. 3. There is patchy peribronchovascular type opacities in both lower lobes and in the right middle lobe, consent of atelectasis, superimposed pneumonia or a combination. 4. No other evidence of an acute abnormality. 5. Aortic atherosclerosis.  Emphysema. Aortic Atherosclerosis (ICD10-I70.0) and Emphysema (ICD10-J43.9). Electronically Signed   By: Lajean Manes M.D.   On: 12/19/2017 14:43        Scheduled Meds: . calcium carbonate  1 tablet Oral BID WC  . dexamethasone  4 mg Oral Q breakfast  . fluconazole  100 mg Oral Daily  . heparin  5,000 Units Subcutaneous Q8H  . multivitamin with minerals  1 tablet Oral Daily  . oxyCODONE  15 mg Oral Q12H  . senna-docusate  2 tablet Oral QHS  . sodium chloride flush  3 mL Intravenous Q12H   Continuous Infusions: . sodium chloride    . ceFEPime (MAXIPIME) IV Stopped (12/20/17 0401)  . lactated ringers 75 mL/hr at 12/19/17 2040  . vancomycin        LOS: 1 day        Aline August, MD [facility] Pager [phone]  If 7PM-7AM, please contact night-coverage [email or web address] Password [facility] 12/20/2017, 12:18 PM

## 2017-12-21 DIAGNOSIS — R0902 Hypoxemia: Secondary | ICD-10-CM

## 2017-12-21 DIAGNOSIS — D72829 Elevated white blood cell count, unspecified: Secondary | ICD-10-CM

## 2017-12-21 LAB — CBC WITH DIFFERENTIAL/PLATELET
BASOS PCT: 0 %
Band Neutrophils: 9 %
Basophils Absolute: 0 10*3/uL (ref 0.0–0.1)
Blasts: 0 %
EOS ABS: 0 10*3/uL (ref 0.0–0.7)
Eosinophils Relative: 0 %
HCT: 31.3 % — ABNORMAL LOW (ref 36.0–46.0)
Hemoglobin: 9.9 g/dL — ABNORMAL LOW (ref 12.0–15.0)
LYMPHS ABS: 2.3 10*3/uL (ref 0.7–4.0)
Lymphocytes Relative: 9 %
MCH: 31.2 pg (ref 26.0–34.0)
MCHC: 31.6 g/dL (ref 30.0–36.0)
MCV: 98.7 fL (ref 78.0–100.0)
METAMYELOCYTES PCT: 3 %
MONO ABS: 0.5 10*3/uL (ref 0.1–1.0)
Monocytes Relative: 2 %
Myelocytes: 1 %
NEUTROS PCT: 76 %
NRBC: 0 /100{WBCs}
Neutro Abs: 22.7 10*3/uL — ABNORMAL HIGH (ref 1.7–7.7)
Other: 0 %
PLATELETS: 198 10*3/uL (ref 150–400)
PROMYELOCYTES RELATIVE: 0 %
RBC: 3.17 MIL/uL — ABNORMAL LOW (ref 3.87–5.11)
RDW: 21.5 % — AB (ref 11.5–15.5)
WBC: 25.5 10*3/uL — AB (ref 4.0–10.5)

## 2017-12-21 LAB — COMPREHENSIVE METABOLIC PANEL
ALBUMIN: 3.1 g/dL — AB (ref 3.5–5.0)
ALT: 36 U/L (ref 14–54)
AST: 23 U/L (ref 15–41)
Alkaline Phosphatase: 445 U/L — ABNORMAL HIGH (ref 38–126)
Anion gap: 9 (ref 5–15)
BUN: 11 mg/dL (ref 6–20)
CHLORIDE: 104 mmol/L (ref 101–111)
CO2: 24 mmol/L (ref 22–32)
CREATININE: 0.47 mg/dL (ref 0.44–1.00)
Calcium: 8.4 mg/dL — ABNORMAL LOW (ref 8.9–10.3)
GFR calc Af Amer: >60 mL/min (ref >60)
GFR calc non Af Amer: >60 mL/min (ref >60)
GLUCOSE: 111 mg/dL — AB (ref 65–99)
POTASSIUM: 4.1 mmol/L (ref 3.5–5.1)
SODIUM: 137 mmol/L (ref 135–145)
Total Bilirubin: 0.4 mg/dL (ref 0.3–1.2)
Total Protein: 6.2 g/dL — ABNORMAL LOW (ref 6.5–8.1)

## 2017-12-21 LAB — MAGNESIUM: MAGNESIUM: 1.8 mg/dL (ref 1.7–2.4)

## 2017-12-21 MED ORDER — TRAZODONE HCL 50 MG PO TABS
50.0000 mg | ORAL_TABLET | Freq: Once | ORAL | Status: AC
  Administered 2017-12-21: 50 mg via ORAL
  Filled 2017-12-21: qty 1

## 2017-12-21 MED ORDER — FUROSEMIDE 10 MG/ML IJ SOLN
40.0000 mg | Freq: Once | INTRAMUSCULAR | Status: AC
  Administered 2017-12-21: 40 mg via INTRAVENOUS
  Filled 2017-12-21: qty 4

## 2017-12-21 NOTE — Progress Notes (Signed)
Patient ID: Haley [name], female   DOB: 02-25-1951, 67 y.o.   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  She was admitted with probable healthcare associated pneumonia and started on antibiotics.  Assessment & Plan:   Active Problems:   Mood disorder (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  Discontinue vancomycin.  Cultures negative so far.  Oxygen supplementation as needed  -Incentive spirometry  Hypoxia -Probably secondary to above.  Improving.  Wean off oxygen as able  Leukocytosis -Probably secondary to above.  Might also be reactive secondary to chronic steroid use.  Slightly worse today.  Repeat a.m. labs.  Continue antibiotics  Small cell lung cancer with metastases on chemotherapy -Outpatient follow-up with oncology/Dr. Irene Limbo  DVT prophylaxis: Heparin Code Status: Full Family Communication: None at bedside Disposition Plan: Home in 1 to 2 days pending clinical improvement  Consultants: None  Procedures: None  Antimicrobials: Cefepime and vancomycin from 12/19/17  Subjective: Patient seen and examined at bedside.  She feels slightly better but is still very short of breath with minimal activity with some cough.  No overnight fever, nausea or vomiting.    Objective: Vitals:   12/20/17 1747 12/20/17 1822 12/20/17 1952 12/21/17 0540  BP:   118/62 129/77  Pulse:   71 65  Resp:   16 16  Temp:   97.9 F (36.6 C) 97.6 F (36.4 C)  TempSrc:    Oral  SpO2:  93% 94% 94%  Weight: 50.8 kg (112 lb)     Height: 5\' 3"  (1.6 m)        Intake/Output Summary (Last 24 hours) at 12/21/2017 1023 Last data filed at 12/21/2017 9357 Gross per 24 hour  Intake 2100 ml  Output -  Net 2100 ml   Filed Weights   12/20/17 1747  Weight: 50.8 kg (112 lb)    Examination:  General exam: Appears calm and comfortable  Respiratory system: Bilateral decreased breath sound at bases Cardiovascular system: S1 & S2 heard, rate controlled  gastrointestinal system: Abdomen is nondistended, soft and nontender. Normal bowel sounds heard. Extremities: No cyanosis, clubbing, edema   Data Reviewed: I have personally reviewed following labs and imaging studies  CBC: Recent Labs  Lab 12/19/17 1242 12/20/17 0803 12/21/17 0337  WBC 17.6* 20.3* 25.5*  NEUTROABS 13.4*  --  22.7*  HGB 10.7* 10.4* 9.9*  HCT 33.3* 32.9* 31.3*  MCV 98.2 98.5 98.7  PLT 181 176 017   Basic Metabolic Panel: Recent Labs  Lab 12/19/17 1242 12/20/17 0803 12/21/17 0337  NA 133* 135 137  K 3.8 4.6 4.1  CL 99* 102 104  CO2 23 22 24   GLUCOSE 129* 227* 111*  BUN 10 9 11   CREATININE 0.44 0.51 0.47  CALCIUM 7.9* 7.3* 8.4*  MG  --  2.1 1.8   GFR: Estimated Creatinine Clearance: 56.2 mL/min (by C-G formula based on SCr of 0.47 mg/dL). Liver Function Tests: Recent Labs  Lab 12/19/17 1242 12/20/17 0803 12/21/17 0337  AST 29 31 23   ALT 41 43 36  ALKPHOS 525* 470* 445*  BILITOT 0.6 0.5 0.4  PROT 6.7  6.6 6.2*  ALBUMIN 3.5 3.2* 3.1*   No results for input(s): LIPASE, AMYLASE in the last 168 hours. No results for input(s): AMMONIA in the last 168 hours. Coagulation Profile: No results for input(s): INR, PROTIME in the last 168 hours. Cardiac Enzymes: No results for input(s): CKTOTAL, CKMB, CKMBINDEX, TROPONINI in the last 168 hours. BNP (last 3 results) No results for input(s): PROBNP in the last 8760 hours. HbA1C: No results for input(s): HGBA1C in the last 72 hours. CBG: No results for input(s): GLUCAP in the last 168 hours. Lipid Profile: No  results for input(s): CHOL, HDL, LDLCALC, TRIG, CHOLHDL, LDLDIRECT in the last 72 hours. Thyroid Function Tests: No results for input(s): TSH, T4TOTAL, FREET4, T3FREE, THYROIDAB in the last 72 hours. Anemia Panel: No results for input(s): VITAMINB12, FOLATE, FERRITIN, TIBC, IRON, RETICCTPCT in the last 72 hours. Sepsis Labs: Recent Labs  Lab 12/19/17 1346 12/19/17 1532  LATICACIDVEN 2.10* 3.54*    Recent Results (from the past 240 hour(s))  Blood culture (routine x 2)     Status: None (Preliminary result)   Collection Time: 12/19/17  1:11 PM  Result Value Ref Range Status   Specimen Description   Final    BLOOD RIGHT HAND Performed at [facility] [address], [place], [place] [address]    Special Requests   Final    BOTTLES DRAWN AEROBIC AND ANAEROBIC Blood Culture adequate volume Performed at [facility] [address] [place], [place] [address]    Culture   Final    NO GROWTH < 24 HOURS Performed at [facility] [address] [place], [place] [address]    Report Status PENDING  Incomplete  Blood culture (routine x 2)     Status: None (Preliminary result)   Collection Time: 12/19/17  1:16 PM  Result Value Ref Range Status   Specimen Description   Final    BLOOD RIGHT ANTECUBITAL Performed at [facility] [address] [place], [place] [address]    Special Requests   Final    BOTTLES DRAWN AEROBIC AND ANAEROBIC Blood Culture adequate volume Performed at [facility] [address] [place], [place] [address]    Culture   Final    NO GROWTH < 24 HOURS Performed at [facility] [address], [place],  [address]    Report Status PENDING  Incomplete         Radiology Studies: Dg Chest 2 View  Result Date: 12/19/2017 CLINICAL DATA:  Shortness of breath. Reported history of lung carcinoma EXAM: CHEST - 2 VIEW COMPARISON:  Chest radiograph October 05, 2017  and PET-CT October 09, 2017 FINDINGS: There is patchy consolidation in the right lower lobe with small right pleural effusion. Lungs elsewhere are clear. Heart size and pulmonary vascular normal. No adenopathy evident. Port-A-Cath tip is in the superior vena cava. No well-defined blastic or lytic bone lesions are evident by radiography. There is evidence of old trauma involving the lateral right clavicle. IMPRESSION: Consolidation right base. Underlying mass in this area cannot be excluded by radiography. Small right pleural effusion. Left lung clear. Heart size normal. No adenopathy appreciable by radiography. Port-A-Cath tip in superior vena cava. Electronically Signed   By: Lowella Grip III M.D.   On: 12/19/2017 13:05   Ct Angio Chest Pe W And/or Wo Contrast  Result Date: 12/19/2017 CLINICAL DATA:  Short of breath for 1 week. History of lung carcinoma diagnosed in  February 2019. Currently on chemotherapy. EXAM: CT ANGIOGRAPHY CHEST WITH CONTRAST TECHNIQUE: Multidetector CT imaging of the chest was performed using the standard protocol during bolus administration of intravenous contrast. Multiplanar CT image reconstructions and MIPs were obtained to evaluate the vascular anatomy. CONTRAST:  128mL ISOVUE-370 IOPAMIDOL (ISOVUE-370) INJECTION 76% COMPARISON:  Current chest radiographs.  Prior chest CT, 10/05/2017. FINDINGS: Cardiovascular: There is satisfactory opacification of the pulmonary arteries to the segmental level. Study is somewhat degraded by respiratory motion, particularly affecting evaluation of the lower segmental and subsegmental pulmonary arteries. Allowing for the respiratory motion, there is no evidence of a pulmonary embolism. Heart is normal in size and configuration. No pericardial effusion. Mild left coronary artery calcifications. Aorta is normal in caliber. Mild descending thoracic aortic atherosclerotic disease. Mediastinum/Nodes: No neck base or axillary masses or adenopathy. No  mediastinal masses or adenopathy. Left hilum is unremarkable. Soft tissue surrounds the right infrahilar structures. The bronchus intermedius is occluded. Soft tissue attenuation material surrounds the occluded bronchus intermedius and lower lobe bronchus abutting the mediastinal pleura adjacent to the right heart. Lungs/Pleura: There is partial atelectasis of the right lower and middle lobes. Patchy peribronchovascular opacities are noted in the aerated portions of the right lower and middle lobes and in the left lower lobe, which may also be atelectasis. Superimposed pneumonia is possible. Stable changes of moderate centrilobular emphysema. No new masses or suspicious nodules. No pleural effusion or pneumothorax. Upper Abdomen: No acute findings. There is a biliary stent, partly imaged, new since the prior CT. Musculoskeletal: No fracture or acute finding. No osteoblastic or osteolytic lesions. Review of the MIP images confirms the above findings. IMPRESSION: 1. No evidence of a pulmonary embolism. 2. Atelectasis is noted in the right lower lobe and right middle lobe which is decreased in degree when compared to the prior CT. There is still occlusion of the bronchus intermedius presumably from tumor, with surrounding soft tissue consistent with the known malignancy. This has also improved. At the level of the left atrium at the entrance of the inferior right pulmonary vein, the soft tissue currently measures 3.4 x 1.9 cm transversely, previously 3.5 x 2.4 cm. 3. There is patchy peribronchovascular type opacities in both lower lobes and in the right middle lobe, consent of atelectasis, superimposed pneumonia or a combination. 4. No other evidence of an acute abnormality. 5. Aortic atherosclerosis.  Emphysema. Aortic Atherosclerosis (ICD10-I70.0) and Emphysema (ICD10-J43.9). Electronically Signed   By: Lajean Manes M.D.   On: 12/19/2017 14:43        Scheduled Meds: . calcium carbonate  1 tablet Oral BID WC    . dexamethasone  4 mg Oral Q breakfast  . fluconazole  100 mg Oral Daily  . heparin  5,000 Units Subcutaneous Q8H  . multivitamin with minerals  1 tablet Oral Daily  . oxyCODONE  15 mg Oral Q12H  . senna-docusate  2 tablet Oral QHS  . sodium chloride flush  3 mL Intravenous Q12H   Continuous Infusions: . sodium chloride    . ceFEPime (MAXIPIME) IV Stopped (12/21/17 9562)  . lactated ringers 75 mL/hr at 12/19/17 2040  . vancomycin Stopped (12/20/17 1657)     LOS: 2 days        Aline August, MD [facility] Pager [phone]  If 7PM-7AM, please contact night-coverage [email or web address] Password [facility] 12/21/2017, 10:23 AM

## 2017-12-22 MED ORDER — HEPARIN SOD (PORK) LOCK FLUSH 100 UNIT/ML IV SOLN
500.0000 [IU] | INTRAVENOUS | Status: AC | PRN
  Administered 2017-12-22: 500 [IU]

## 2017-12-22 MED ORDER — AMOXICILLIN-POT CLAVULANATE 875-125 MG PO TABS: 1.0000 | ORAL_TABLET | Freq: Two times a day (BID) | ORAL | 0 refills | Status: AC

## 2017-12-22 MED ORDER — LEVOFLOXACIN 750 MG PO TABS: 750.0000 mg | ORAL_TABLET | Freq: Every day | ORAL | 0 refills | Status: DC

## 2017-12-22 NOTE — Discharge Summary (Addendum)
Physician Discharge Summary  Haley [name] XTK:[id number] DOB: November 18, 1950 DOA: 12/19/2017  PCP: Alycia Rossetti, MD  Admit date: 12/19/2017 Discharge date: 12/22/2017  Admitted From: Home  Disposition: Home  Recommendations for Outpatient Follow-up:  1. Follow up with PCP in 1 week with repeat CBC/BMP 2. Follow-up with oncology/Dr. 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  She was admitted with probable healthcare associated pneumonia and started on antibiotics.  She initially required supplemental oxygen.  She is currently on room air.  She feels much better.  She will be discharged home on oral antibiotics.  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  -Hemodynamically stable.  Discharge home on oral Levaquin for 5 days  Hypoxia -Probably secondary to above.  Resolved.  Currently on room air  Leukocytosis -Probably secondary to above.  Might also be reactive secondary to chronic steroid use.  -Outpatient follow-up  Small cell lung cancer with metastases on chemotherapy -Outpatient follow-up with oncology/Dr. Irene Limbo    Discharge Instructions  Discharge Instructions    Call MD for:  difficulty breathing, headache or visual disturbances   Complete by:  As directed    Call MD for:  extreme fatigue   Complete by:  As directed    Call MD for:  hives   Complete by:  As directed    Call MD for:  persistant dizziness or  light-headedness   Complete by:  As directed    Call MD for:  persistant nausea and vomiting   Complete by:  As directed    Call MD for:  severe uncontrolled pain   Complete by:  As directed    Call MD for:  temperature >100.4   Complete by:  As directed    Diet - low sodium heart healthy   Complete by:  As directed    Increase activity slowly   Complete by:  As directed      Allergies as of 12/22/2017   No Known Allergies     Medication List    STOP taking these medications   tiZANidine 4 MG tablet Commonly known as:  ZANAFLEX     TAKE these medications   amoxicillin-clavulanate 875-125 MG tablet Commonly known as:  AUGMENTIN Take 1 tablet by mouth every 12 (twelve) hours for 5 days.   calcium carbonate 1250 (500 Ca) MG tablet Commonly known as:  OS-CAL - dosed in mg of elemental calcium Take 1 tablet by mouth 2 (two) times daily with a meal.   dexamethasone 4 MG tablet Commonly known as:  DECADRON Take 1 tablet (4 mg total) by mouth daily with breakfast.   fexofenadine 60 MG tablet Commonly known as:  ALLEGRA Take 60 mg by mouth daily as needed for allergies or rhinitis.   fluconazole 100 MG tablet Commonly known as:  DIFLUCAN Take 1 tablet (100 mg total) by mouth daily. On all days except for 5 days during chemotherapy and until neulasta shot.   lidocaine-prilocaine cream Commonly known as:  EMLA Apply to affected area once   LORazepam 0.5  MG tablet Commonly known as:  ATIVAN Take 1 tablet (0.5 mg total) by mouth every 6 (six) hours as needed (Nausea or vomiting).   magic mouthwash w/lidocaine Soln Take 10 mLs by mouth 4 (four) times daily as needed for mouth pain. Rinse and spit or swallow   multivitamin with minerals tablet Take 1 tablet by mouth daily.   ondansetron 4 MG tablet Commonly known as:  ZOFRAN Take 1 tablet (4 mg total) by mouth every 8 (eight) hours as needed for nausea or vomiting.   oxyCODONE 5 MG immediate release tablet Commonly  known as:  Oxy IR/ROXICODONE Take 1-2 tablets (5-10 mg total) by mouth every 4 (four) hours as needed for severe pain.   oxyCODONE 15 mg 12 hr tablet Commonly known as:  OXYCONTIN Take 1 tablet (15 mg total) by mouth every 12 (twelve) hours.   polyethylene glycol packet Commonly known as:  MIRALAX Take 17 g by mouth daily as needed for moderate constipation.   prochlorperazine 10 MG tablet Commonly known as:  COMPAZINE Take 1 tablet (10 mg total) by mouth every 6 (six) hours as needed (Nausea or vomiting).   senna-docusate 8.6-50 MG tablet Commonly known as:  SENNA S Take 2 tablets by mouth at bedtime.       Follow-up Information    [employer], Modena Nunnery, MD. Schedule an appointment as soon as possible for a visit in 1 week(s).   Specialty:  Family Medicine Why:  With repeat CBC/BMP Contact information: [place] [address] [phone]        Brunetta Genera, MD Follow up.   Specialties:  Hematology, Oncology Why:  Keep scheduled appointment Contact information: [place] [address] [phone]          No Known Allergies  Consultations:  None   Procedures/Studies: Dg Chest 2 View  Result Date: 12/19/2017 CLINICAL DATA:  Shortness of breath. Reported history of lung carcinoma EXAM: CHEST - 2 VIEW COMPARISON:  Chest radiograph October 05, 2017 and PET-CT October 09, 2017 FINDINGS: There is patchy consolidation in the right lower lobe with small right pleural effusion. Lungs elsewhere are clear. Heart size and pulmonary vascular normal. No adenopathy evident. Port-A-Cath tip is in the superior vena cava. No well-defined blastic or lytic bone lesions are evident by radiography. There is evidence of old trauma involving the lateral right clavicle. IMPRESSION: Consolidation right base. Underlying mass in this area cannot be excluded by radiography. Small right pleural effusion. Left lung clear. Heart size normal. No  adenopathy appreciable by radiography. Port-A-Cath tip in superior vena cava. Electronically Signed   By: Lowella Grip III M.D.   On: 12/19/2017 13:05   Ct Angio Chest Pe W And/or Wo Contrast  Result Date: 12/19/2017 CLINICAL DATA:  Short of breath for 1 week. History of lung carcinoma diagnosed in February 2019. Currently on chemotherapy. EXAM: CT ANGIOGRAPHY CHEST WITH CONTRAST TECHNIQUE: Multidetector CT imaging of the chest was performed using the standard protocol during bolus administration of intravenous contrast. Multiplanar CT image reconstructions and MIPs were obtained to evaluate the vascular anatomy. CONTRAST:  154mL ISOVUE-370 IOPAMIDOL (ISOVUE-370) INJECTION 76% COMPARISON:  Current chest radiographs.  Prior chest CT, 10/05/2017. FINDINGS: Cardiovascular: There is satisfactory opacification of the pulmonary arteries to the segmental level. Study is somewhat degraded by respiratory motion, particularly affecting evaluation of the lower segmental and subsegmental pulmonary arteries. Allowing for the respiratory motion, there is no evidence of a pulmonary embolism. Heart  is normal in size and configuration. No pericardial effusion. Mild left coronary artery calcifications. Aorta is normal in caliber. Mild descending thoracic aortic atherosclerotic disease. Mediastinum/Nodes: No neck base or axillary masses or adenopathy. No mediastinal masses or adenopathy. Left hilum is unremarkable. Soft tissue surrounds the right infrahilar structures. The bronchus intermedius is occluded. Soft tissue attenuation material surrounds the occluded bronchus intermedius and lower lobe bronchus abutting the mediastinal pleura adjacent to the right heart. Lungs/Pleura: There is partial atelectasis of the right lower and middle lobes. Patchy peribronchovascular opacities are noted in the aerated portions of the right lower and middle lobes and in the left lower lobe, which may also be atelectasis. Superimposed  pneumonia is possible. Stable changes of moderate centrilobular emphysema. No new masses or suspicious nodules. No pleural effusion or pneumothorax. Upper Abdomen: No acute findings. There is a biliary stent, partly imaged, new since the prior CT. Musculoskeletal: No fracture or acute finding. No osteoblastic or osteolytic lesions. Review of the MIP images confirms the above findings. IMPRESSION: 1. No evidence of a pulmonary embolism. 2. Atelectasis is noted in the right lower lobe and right middle lobe which is decreased in degree when compared to the prior CT. There is still occlusion of the bronchus intermedius presumably from tumor, with surrounding soft tissue consistent with the known malignancy. This has also improved. At the level of the left atrium at the entrance of the inferior right pulmonary vein, the soft tissue currently measures 3.4 x 1.9 cm transversely, previously 3.5 x 2.4 cm. 3. There is patchy peribronchovascular type opacities in both lower lobes and in the right middle lobe, consent of atelectasis, superimposed pneumonia or a combination. 4. No other evidence of an acute abnormality. 5. Aortic atherosclerosis.  Emphysema. Aortic Atherosclerosis (ICD10-I70.0) and Emphysema (ICD10-J43.9). Electronically Signed   By: Lajean Manes M.D.   On: 12/19/2017 14:43   Mr Jeri Cos ZO Contrast  Result Date: 11/28/2017 CLINICAL DATA:  Metastatic lung cancer.  Follow-up after treatment. EXAM: MRI HEAD WITHOUT AND WITH CONTRAST TECHNIQUE: Multiplanar, multiecho pulse sequences of the brain and surrounding structures were obtained without and with intravenous contrast. CONTRAST:  37mL MULTIHANCE GADOBENATE DIMEGLUMINE 529 MG/ML IV SOLN COMPARISON:  MRI of the head October 29, 2017 FINDINGS: INTRACRANIAL CONTENTS: Interval increase in number of nonenhancing metastasis measuring to 11 mm including LEFT temporal cortex (series 7, image 12), LEFT posterior temporal lobe (series 7, image 13), subcentimeter  bilateral cerebella, RIGHT occipital cortex (series 7, image 18), LEFT occipital lobe (series 7, image 12) and LEFT posterior frontal lobe cortex (series 7, image 20). 6 mm LEFT temporal periventricular white matter lesion was 5 mm. Metastasis demonstrate reduced diffusion compatible with hypercellular tumor. LEFT frontal developmental venous anomaly. Stable number of enhancing intracranial metastasis (some of which demonstrate T1 shortening), some are stable and some are smaller. No midline shift or mass effect. No parenchymal brain volume loss for age. No susceptibility artifact to suggest hemorrhage. No abnormal extra-axial fluid collections or enhancement. VASCULAR: Normal major intracranial vascular flow voids present at skull base. SKULL AND UPPER CERVICAL SPINE: No abnormal sellar expansion. Heterogeneous hypo bow intense T1 calvarial and cervical spine signal consistent with known metastasis. Craniocervical junction maintained. SINUSES/ORBITS: The mastoid air-cells and included paranasal sinuses are well-aerated.The included ocular globes and orbital contents are non-suspicious. OTHER: None. IMPRESSION: 1. New nonenhancing subcentimeter supra-and infratentorial intraparenchymal metastasis. 2. Stable number of enhancing subcentimeter intraparenchymal metastasis, which vary from stable to decreased in size. 3. Multiple osseous metastasis. Electronically Signed  By: Elon Alas M.D.   On: 11/28/2017 17:20       Subjective: Patient seen and examined at bedside.  She feels much better and wants to go home.  No overnight fever, nausea or vomiting.  Discharge Exam: Vitals:   12/22/17 0604 12/22/17 0610  BP: 137/79   Pulse: 72   Resp: 19   Temp: 98.2 F (36.8 C)   SpO2: 93% 92%   Vitals:   12/21/17 1539 12/21/17 2011 12/22/17 0604 12/22/17 0610  BP:  113/70 137/79   Pulse:  62 72   Resp:  17 19   Temp:  98.4 F (36.9 C) 98.2 F (36.8 C)   TempSrc:  Oral Oral   SpO2: 92% 92% 93% 92%   Weight:      Height:        General: Pt is alert, awake, not in acute distress Cardiovascular: Rate controlled, S1/S2 + Respiratory: Bilateral decreased breath sounds at bases Abdominal: Soft, NT, ND, bowel sounds + Extremities: no edema, no cyanosis    The results of significant diagnostics from this hospitalization (including imaging, microbiology, ancillary and laboratory) are listed below for reference.     Microbiology: Recent Results (from the past 240 hour(s))  Blood culture (routine x 2)     Status: None (Preliminary result)   Collection Time: 12/19/17  1:11 PM  Result Value Ref Range Status   Specimen Description   Final    BLOOD RIGHT HAND Performed at [facility] [address] [place], [place] [address]    Special Requests   Final    BOTTLES DRAWN AEROBIC AND ANAEROBIC Blood Culture adequate volume Performed at [facility] [address], [place], [place] [address]    Culture   Final    NO GROWTH 2 DAYS Performed at [facility] [address] [place], [place] [address]    Report Status PENDING  Incomplete  Blood culture (routine x 2)     Status: None (Preliminary result)   Collection Time: 12/19/17  1:16 PM  Result Value Ref Range Status   Specimen Description   Final    BLOOD RIGHT ANTECUBITAL Performed at [facility] [address], [place], [place] [address]    Special Requests   Final    BOTTLES DRAWN AEROBIC AND ANAEROBIC Blood Culture adequate volume Performed at [facility] [address] [place], [place] [address]    Culture   Final    NO GROWTH 2 DAYS Performed at [facility] [address], [place], [place] [address]    Report Status PENDING  Incomplete     Labs: BNP (last 3 results) No results for input(s): BNP in the last 8760 hours. Basic Metabolic Panel: Recent Labs  Lab 12/19/17 1242 12/20/17 0803 12/21/17 0337  NA 133* 135 137   K 3.8 4.6 4.1  CL 99* 102 104  CO2 23 22 24   GLUCOSE 129* 227* 111*  BUN 10 9 11   CREATININE 0.44 0.51 0.47  CALCIUM 7.9* 7.3* 8.4*  MG  --  2.1 1.8   Liver Function Tests: Recent Labs  Lab 12/19/17 1242 12/20/17 0803 12/21/17 0337  AST 29 31 23   ALT 41 43 36  ALKPHOS 525* 470* 445*  BILITOT 0.6 0.5 0.4  PROT 6.7 6.6 6.2*  ALBUMIN 3.5 3.2* 3.1*   No results for input(s): LIPASE, AMYLASE in the last 168 hours. No results for input(s): AMMONIA in the  last 168 hours. CBC: Recent Labs  Lab 12/19/17 1242 12/20/17 0803 12/21/17 0337  WBC 17.6* 20.3* 25.5*  NEUTROABS 13.4*  --  22.7*  HGB 10.7* 10.4* 9.9*  HCT 33.3* 32.9* 31.3*  MCV 98.2 98.5 98.7  PLT 181 176 198   Cardiac Enzymes: No results for input(s): CKTOTAL, CKMB, CKMBINDEX, TROPONINI in the last 168 hours. BNP: Invalid input(s): POCBNP CBG: No results for input(s): GLUCAP in the last 168 hours. D-Dimer No results for input(s): DDIMER in the last 72 hours. Hgb A1c No results for input(s): HGBA1C in the last 72 hours. Lipid Profile No results for input(s): CHOL, HDL, LDLCALC, TRIG, CHOLHDL, LDLDIRECT in the last 72 hours. Thyroid function studies No results for input(s): TSH, T4TOTAL, T3FREE, THYROIDAB in the last 72 hours.  Invalid input(s): FREET3 Anemia work up No results for input(s): VITAMINB12, FOLATE, FERRITIN, TIBC, IRON, RETICCTPCT in the last 72 hours. Urinalysis    Component Value Date/Time   COLORURINE YELLOW 05/24/2017 1021   APPEARANCEUR CLEAR 05/24/2017 1021   LABSPEC 1.011 05/24/2017 1021   PHURINE 6.0 05/24/2017 1021   GLUCOSEU NEGATIVE 05/24/2017 1021   HGBUR MODERATE (A) 05/24/2017 1021   BILIRUBINUR NEGATIVE 05/24/2017 1021   KETONESUR NEGATIVE 05/24/2017 1021   PROTEINUR NEGATIVE 05/24/2017 1021   NITRITE NEGATIVE 05/24/2017 1021   LEUKOCYTESUR NEGATIVE 05/24/2017 1021   Sepsis Labs Invalid input(s): PROCALCITONIN,  WBC,  LACTICIDVEN Microbiology Recent Results (from the  past 240 hour(s))  Blood culture (routine x 2)     Status: None (Preliminary result)   Collection Time: 12/19/17  1:11 PM  Result Value Ref Range Status   Specimen Description   Final    BLOOD RIGHT HAND Performed at [facility], [place] [address], [place], [place] [address]    Special Requests   Final    BOTTLES DRAWN AEROBIC AND ANAEROBIC Blood Culture adequate volume Performed at [facility] [address] [place], [place] [address]    Culture   Final    NO GROWTH 2 DAYS Performed at [facility] [address] [place], [place] [address]    Report Status PENDING  Incomplete  Blood culture (routine x 2)     Status: None (Preliminary result)   Collection Time: 12/19/17  1:16 PM  Result Value Ref Range Status   Specimen Description   Final    BLOOD RIGHT ANTECUBITAL Performed at [facility] [address] [place], [place] [address]    Special Requests   Final    BOTTLES DRAWN AEROBIC AND ANAEROBIC Blood Culture adequate volume Performed at [facility] [address], [place], [place] [address]    Culture   Final    NO GROWTH 2 DAYS Performed at [facility] [address] [place],  [address]    Report Status PENDING  Incomplete     Time coordinating discharge: 35 minutes  SIGNED:   Aline August, MD  [facility] 12/22/2017, 9:06 AM Pager: [phone]  If 7PM-7AM, please contact night-coverage [email or web address] Password [email or web address]

## 2017-12-23 ENCOUNTER — Telehealth: Payer: Self-pay

## 2017-12-23 ENCOUNTER — Encounter: Payer: Self-pay | Admitting: Radiation Oncology

## 2017-12-23 NOTE — Telephone Encounter (Signed)
Pt called with questions regarding scans and fluconazole. MRI and PET scheduled for 5/3 prior to cycle 4 of treatment on 5/6. Pt wondering if that was okay as Dr. Irene Limbo had said post cycle 4 at a previous appt. Discussed with MD and okay to keep scans as scheduled. Fluconazole to continue taking daily except for on days of chemotherapy. Left VM on pt phone with information.

## 2017-12-24 LAB — CULTURE, BLOOD (ROUTINE X 2)
CULTURE: NO GROWTH
Culture: NO GROWTH
Special Requests: ADEQUATE
Special Requests: ADEQUATE

## 2017-12-25 ENCOUNTER — Ambulatory Visit: Payer: 59 | Attending: Radiation Oncology | Admitting: Radiation Oncology

## 2017-12-25 ENCOUNTER — Ambulatory Visit: Payer: 59

## 2017-12-26 ENCOUNTER — Encounter (HOSPITAL_COMMUNITY)
Admission: RE | Admit: 2017-12-26 | Discharge: 2017-12-26 | Disposition: A | Discharge status: Home / Self Care | Payer: Medicare Other | Source: Ambulatory Visit | Attending: Hematology | Admitting: Hematology

## 2017-12-26 ENCOUNTER — Ambulatory Visit (HOSPITAL_COMMUNITY): Payer: Medicare Other

## 2017-12-26 ENCOUNTER — Encounter: Payer: Self-pay | Admitting: Pharmacist

## 2017-12-26 ENCOUNTER — Other Ambulatory Visit: Payer: Self-pay

## 2017-12-26 ENCOUNTER — Ambulatory Visit (HOSPITAL_COMMUNITY): Admission: RE | Admit: 2017-12-26 | Payer: Medicare Other | Source: Ambulatory Visit

## 2017-12-26 DIAGNOSIS — C3490 Malignant neoplasm of unspecified part of unspecified bronchus or lung: Secondary | ICD-10-CM

## 2017-12-26 LAB — GLUCOSE, CAPILLARY: Glucose-Capillary: 110 mg/dL — ABNORMAL HIGH (ref 65–99)

## 2017-12-26 MED ORDER — FLUDEOXYGLUCOSE F - 18 (FDG) INJECTION
5.8000 | Freq: Once | INTRAVENOUS | Status: AC | PRN
  Administered 2017-12-26: 5.8 via INTRAVENOUS

## 2017-12-26 NOTE — Progress Notes (Signed)
HEMATOLOGY/ONCOLOGY CLINIC NOTE  Date of Service: 12/29/17  Patient Care Team: [facility], Modena Nunnery, MD as PCP - General (Family Medicine)  CHIEF COMPLAINTS/PURPOSE OF CONSULTATION:   F/u for continued management of extensive stage small cell lung cancer.  HISTORY OF PRESENTING ILLNESS:   Haley [name] is a wonderful 67 y.o. female who has been referred to [place] by Orthopedists Dr. Vickki Hearing for evaluation and management of Bone Marrow Abnormality. Her PCP is Dr. Buelah Manis.   She presents to the clinic today accompanied by her sons and her daughter-in-law. She notes she has several issues since October 2018.  She had colitis in 05/2017 with significant diarrhea and abdominal cramping --now mostly resolved.  She was treated with antibiotics. Afterward she had bronchitis. She was given albuterol and continues to take it as she has remaining cough (smoking contributed to this).   On 08/07/17 she notes she has back pain in her lower back the past couple of months. 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 Next day she saw her physician, who had done a xray and told her she had arthritis. She was treated with prednisone and gabapentin. After no back pain improvements on treatment, she has yet to return to work. She went to orthopedist and had a Lumbar MRI on 09/25/17. This shows a compression fracture of the L2 with abnormalities in her bone marrow.   She quit smoking 09/24/17. She had been smoking 1 pack a day for 50 years, since she was 67 yo. She still has cough from smoking but not as significant.   She denies heart conditions or disease, GI issues or diseases or other significant medical issues. DM runs in her family but no known cancers, blood disorders, or gene mutations. Prior to her health in the past 6 months she has lead a independent overall healthy life. Her last mammogram in 04/2017 was negative. Last colonoscopy in 2015 and last [place] in 2017, were negative. She  had a bone density scan in 2009. She has never been on HR therapy.   Given her significant pain (up to 10/10). She currently takes hydrocodone 1 tablet q4-5hours. This only reduced her pain to 7-8/10. She takes Tizanidine 63m q5-6hours. She has a back brace that she does not use.   On review of symptoms, pt notes ongoing b/l hip pain, lower back pain which can radiate up the side and down her legs and buttocks. The pain can keep her up at night. She notes when she lays on her right side, her throat pain which causes her to cough. This started around time of bronchitis and has not improved. When she does cough she produces clear phlegm.  Interval History:   Haley SCHWEPPEreturns today regarding her extensive stage metastatic small cell lung cancer.The patient's last visit with [place] on 12/04/17. She is accompanied today by her sister and her son. The pt reports that she is doing well overall.   The pt reports that she presented to the ED on 12/19/17 with SOB, and was diagnosed with pneumonia, being discharged with Levaquin. She is off oxygen today. She notes that her SOB occurs when she walks around.   She notes that her throat is mildly sore with minor irritation. She notes that she is still coughing some but it has gotten much better.   She notes that she is feeling much better but still has some residual SOB today. She also feels minimal pressure in  her back corresponding to her known L2 fracture. -much improved.  She will be seeing Dr Lisbeth Renshaw in IR tomorrow for consideration of brain RT.  Of note since the patient's last visit, pt has had CXR completed on 12/19/17 with results revealing Consolidation right base. Underlying mass in this area cannot be excluded by radiography. Small right pleural effusion. Left lung clear. Heart size normal. No adenopathy appreciable by radiography. Port-A-Cath tip in superior vena cava.  On 12/19/17 the pt also had a CT Chest Angio which revealed 1. No evidence of  a pulmonary embolism. 2. Atelectasis is noted in the right lower lobe and right middle lobe which is decreased in degree when compared to the prior CT. There is still occlusion of the bronchus intermedius presumably from tumor, with surrounding soft tissue consistent with the known malignancy. This has also improved. At the level of the left atrium at the entrance of the inferior right pulmonary vein, the soft tissue currently measures 3.4 x 1.9 cm transversely, previously 3.5 x 2.4 cm. 3. There is patchy peribronchovascular type opacities in both lower lobes and in the right middle lobe, consent of atelectasis, superimposed pneumonia or a combination. 4. No other evidence of an acute abnormality. 5. Aortic atherosclerosis.  Emphysema.  On 12/26/17 the pt also had a PET which revealed 1. Marked improvement in the previously demonstrated widespread hypermetabolic metastatic disease. There is only mild residual hypermetabolic activity within some of the osseous lesions. No residual nodal, hepatic, pancreatic or adrenal disease identified. No disease progression identified. 2. Persistent occlusion of the bronchus intermedius with right infrahilar pulmonary opacity. 3. Pneumobilia status post biliary stenting. 4. Aortic Atherosclerosis (ICD10-I70.0) and Emphysema  Lab results today (12/29/17) of CBC, CMP is as follows: all values are WNL except for WBC at 12.8k, RBC at 3.45, Hgb at 10.9, HCT at 34.3, RDW at 21.2, Neutro Abs at 8.9, Monocytes Abs at 1.4k, Sodium at 134, Creatinine at 0.57, Calcium at 7.7, Alk Phos at 386. TSH 12/29/17 was WNL at 2.937.   On review of systems, pt reports some SOB, resolving cough, strong appetite, return to normal weight, and denies nausea, headaches, vision changes, and any other symptoms.  MEDICAL HISTORY:  Past Medical History:  Diagnosis Date  . Allergy   . Osteopenia     SURGICAL HISTORY: Past Surgical History:  Procedure Laterality Date  . COLONOSCOPY      . COLONOSCOPY WITH PROPOFOL N/A 10/25/2014   Procedure: COLONOSCOPY WITH PROPOFOL;  Surgeon: Garlan Fair, MD;  Location: [facility];  Service: Endoscopy;  Laterality: N/A;  . ERCP N/A 10/15/2017   Procedure: ENDOSCOPIC RETROGRADE CHOLANGIOPANCREATOGRAPHY (ERCP);  Surgeon: Clarene Essex, MD;  Location: [facility];  Service: Endoscopy;  Laterality: N/A;  . IR FLUORO GUIDE PORT INSERTION RIGHT  11/10/2017  . IR RADIOLOGIST EVAL & MGMT  10/02/2017  . IR US GUIDE VASC ACCESS RIGHT  11/10/2017    SOCIAL HISTORY: Social History   Socioeconomic History  . Marital status: Single    Spouse name: Not on file  . Number of children: Not on file  . Years of education: Not on file  . Highest education level: Not on file  Occupational History  . Not on file  Social Needs  . Financial resource strain: Not on file  . Food insecurity:    Worry: Not on file    Inability: Not on file  . Transportation needs:    Medical: Not on file    Non-medical: Not on file  Tobacco Use  . Smoking status: Former Smoker    Packs/day: 1.00    Years: 40.00    Pack years: 40.00    Types: Cigarettes    Last attempt to quit: 09/24/2017    Years since quitting: 0.2  . Smokeless tobacco: Never Used  Substance and Sexual Activity  . Alcohol use: Yes    Comment: rare social  . Drug use: No  . Sexual activity: Not Currently  Lifestyle  . Physical activity:    Days per week: Not on file    Minutes per session: Not on file  . Stress: Not on file  Relationships  . Social connections:    Talks on phone: Not on file    Gets together: Not on file    Attends religious service: Not on file    Active member of club or organization: Not on file    Attends meetings of clubs or organizations: Not on file    Relationship status: Not on file  . Intimate partner violence:    Fear of current or ex partner: Not on file    Emotionally abused: Not on file    Physically abused: Not on file    Forced sexual activity: Not on  file  Other Topics Concern  . Not on file  Social History Narrative  . Not on file    FAMILY HISTORY: Family History  Problem Relation Age of Onset  . Diabetes Mother   . Hypertension Mother   . Diabetes Sister   . Hypertension Sister   . Hyperlipidemia Sister     ALLERGIES:  has No Known Allergies.  MEDICATIONS:  Current Outpatient Medications  Medication Sig Dispense Refill  . calcium carbonate (OS-CAL - DOSED IN MG OF ELEMENTAL CALCIUM) 1250 (500 Ca) MG tablet Take 1 tablet by mouth 2 (two) times daily with a meal.    . dexamethasone (DECADRON) 4 MG tablet Take 1 tablet (4 mg total) by mouth daily with breakfast. 20 tablet 0  . fexofenadine (ALLEGRA) 60 MG tablet Take 60 mg by mouth daily as needed for allergies or rhinitis.    . fluconazole (DIFLUCAN) 100 MG tablet Take 1 tablet (100 mg total) by mouth daily. On all days except for 5 days during chemotherapy and until neulasta shot. 30 tablet 1  . lidocaine-prilocaine (EMLA) cream Apply to affected area once 30 g 3  . LORazepam (ATIVAN) 0.5 MG tablet Take 1 tablet (0.5 mg total) by mouth every 6 (six) hours as needed (Nausea or vomiting). 30 tablet 0  . magic mouthwash w/lidocaine SOLN Take 10 mLs by mouth 4 (four) times daily as needed for mouth pain. Rinse and spit or swallow 240 mL 2  . Multiple Vitamins-Minerals (MULTIVITAMIN WITH MINERALS) tablet Take 1 tablet by mouth daily.    . ondansetron (ZOFRAN) 4 MG tablet Take 1 tablet (4 mg total) by mouth every 8 (eight) hours as needed for nausea or vomiting. 20 tablet 0  . oxyCODONE (OXY IR/ROXICODONE) 5 MG immediate release tablet Take 1-2 tablets (5-10 mg total) by mouth every 4 (four) hours as needed for severe pain. 60 tablet 0  . oxyCODONE (OXYCONTIN) 15 mg 12 hr tablet Take 1 tablet (15 mg total) by mouth every 12 (twelve) hours. 60 tablet 0  . polyethylene glycol (MIRALAX) packet Take 17 g by mouth daily as needed for moderate constipation. 30 each 0  . prochlorperazine  (COMPAZINE) 10 MG tablet Take 1 tablet (10 mg total) by mouth every 6 (six)  hours as needed (Nausea or vomiting). 30 tablet 1  . senna-docusate (SENNA S) 8.6-50 MG tablet Take 2 tablets by mouth at bedtime. 60 tablet 1   No current facility-administered medications for this visit.    Facility-Administered Medications Ordered in Other Visits  Medication Dose Route Frequency Provider Last Rate Last Dose  . acetaminophen (TYLENOL) tablet 650 mg  650 mg Oral Once Curt Bears, MD      . diphenhydrAMINE (BENADRYL) capsule 25 mg  25 mg Oral Once Curt Bears, MD        REVIEW OF SYSTEMS:   A 10+ POINT REVIEW OF SYSTEMS WAS OBTAINED including neurology, dermatology, psychiatry, cardiac, respiratory, lymph, extremities, GI, GU, Musculoskeletal, constitutional, breasts, reproductive, HEENT.  All pertinent positives are noted in the HPI.  All others are negative.   PHYSICAL EXAMINATION: ECOG PERFORMANCE STATUS: 2 - Symptomatic, <50% confined to bed . Vitals:   12/29/17 0936  BP: 123/75  Pulse: 88  Resp: (!) 22  Temp: 98.5 F (36.9 C)  SpO2: 100%   Filed Weights   12/29/17 0936  Weight: 120 lb 8 oz (54.7 kg)   .Body mass index is 21.35 kg/m.  Marland Kitchen GENERAL:alert, in no acute distress and comfortable SKIN: no acute rashes, no significant lesions EYES: conjunctiva are pink and non-injected, sclera anicteric OROPHARYNX: MMM, no exudates, no oropharyngeal erythema or ulceration NECK: supple, no JVD LYMPH:  no palpable lymphadenopathy in the cervical, axillary or inguinal regions LUNGS: clear to auscultation b/l with normal respiratory effort HEART: regular rate & rhythm ABDOMEN:  normoactive bowel sounds , non tender, not distended. Extremity: no pedal edema PSYCH: alert & oriented x 3 with fluent speech NEURO: no focal motor/sensory deficits   LABORATORY DATA:   . CBC Latest Ref Rng & Units 12/29/2017 12/21/2017 12/20/2017  WBC 3.9 - 10.3 K/uL 12.8(H) 25.5(H) 20.3(H)    Hemoglobin 11.6 - 15.9 g/dL 10.9(L) 9.9(L) 10.4(L)  Hematocrit 34.8 - 46.6 % 34.3(L) 31.3(L) 32.9(L)  Platelets 145 - 400 K/uL 237 198 176  HGB 9.6  . CMP Latest Ref Rng & Units 12/29/2017 12/21/2017 12/20/2017  Glucose 70 - 140 mg/dL 76 111(H) 227(H)  BUN 7 - 26 mg/dL _0 Creatinine 0.60 - 1.10 mg/dL 0.57(L) 0.47 0.51  Sodium 136 - 145 mmol/L 134(L) 137 135  Potassium 3.5 - 5.1 mmol/L 4.1 4.1 4.6  Chloride 98 - 109 mmol/L 102 104 102  CO2 22 - 29 mmol/L _1 Calcium 8.4 - 10.4 mg/dL 7.7(L) 8.4(L) 7.3(L)  Total Protein 6.4 - 8.3 g/dL 6.6 6.2(L) 6.6  Total Bilirubin 0.2 - 1.2 mg/dL 0.4 0.4 0.5  Alkaline Phos 40 - 150 U/L 386(H) 445(H) 470(H)  AST 5 - 34 U/L _2 ALT 0 - 55 U/L 27 36 43     10/17/17 Liver needle/core biopsy   Fine Needle Aspiration, EUS, Pancreas3/12/2017 [facility] Result Narrative  ACCESSION NUMBER: [id number] RECEIVED: 10/24/2017 ORDERING PHYSICIAN: Harley Hallmark , MD PATIENT NAME: Celestia Khat A CYTOLOGY REPORT  Final Cytologic Interpretation  Pancreas, endoscopic ultrasound-guided, fine needle aspiration II (smears and cell block): 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  Specimen Adequacy:Satisfactory for evaluation.    COMMENT:The smears show clusters of neoplastic cells with neuroendocrine nuclear features and scant cytoplasm.  Immunoperoxidase studies on limited cell block material show the neoplastic cells stain positive for synaptophysin, CD56, cytokeratin AE1/AE3 (Golgi-dot like staining) and TTF-1. The MIB-1 shows a high proliferation index with positive staining  in approximately 80% of the cells. Together with the cytomorphology this supports the diagnosis of small cell carcinoma. Given the presence of a suspected right infrahilar lung mass on MRI per [facility] and a pancreatic mass, I cannot be certain of the site of origin and whether this represents a primary  pancreatic small cell poorly differentiated endocrine carcinoma versus a metastatic small cell carcinoma of lung origin (both can express TTF-1). Clinical correlation advised.  Dr. Romilda Garret has reviewed the case in consultation and agrees with the diagnosis of small cell carcinoma.   The positive controls worked appropriately   "These tests were developed and their performance characteristics determined by [facility], [facility].They have not been cleared or approved by the U.S. Food and Drug Administration.The FDA has determined that such clearance or approval is not necessary.These tests are used for clinical purposes.They should not be regarded as investigational or for research.This laboratory is certified under the [place] (CLIA) as qualified to perform high complexity clinical laboratory testing."   Report Prepared By:O.A. Sheryle Hail, M.D.  I have personally reviewed the slides and/or other related materials referenced, and have edited the report as part of my pathologic assessment and final interpretation.  Electronically Signed Out By: Blythe Stanford , MD 10/28/2017 20:55:03  sb/oah  Specimen(s) Received:  Pancreas, endoscopic ultrasound-guided, fine needle aspiration II (smears and cell block)  Clinical History obstructive jaundice  Preliminary (on-site) Interpretation  Adequate. L.[facility] CT(ASCP) 10/24/17     RADIOGRAPHIC STUDIES: I have personally reviewed the radiological images as listed and agreed with the findings in the report. Dg Chest 2 View  Result Date: 12/19/2017 CLINICAL DATA:  Shortness of breath. Reported history of lung carcinoma EXAM: CHEST - 2 VIEW COMPARISON:  Chest radiograph October 05, 2017 and PET-CT October 09, 2017 FINDINGS: There is patchy consolidation in the right lower lobe with small right pleural effusion. Lungs elsewhere are clear. Heart  size and pulmonary vascular normal. No adenopathy evident. Port-A-Cath tip is in the superior vena cava. No well-defined blastic or lytic bone lesions are evident by radiography. There is evidence of old trauma involving the lateral right clavicle. IMPRESSION: Consolidation right base. Underlying mass in this area cannot be excluded by radiography. Small right pleural effusion. Left lung clear. Heart size normal. No adenopathy appreciable by radiography. Port-A-Cath tip in superior vena cava. Electronically Signed   By: Lowella Grip III M.D.   On: 12/19/2017 13:05   Ct Angio Chest Pe W And/or Wo Contrast  Result Date: 12/19/2017 CLINICAL DATA:  Short of breath for 1 week. History of lung carcinoma diagnosed in February 2019. Currently on chemotherapy. EXAM: CT ANGIOGRAPHY CHEST WITH CONTRAST TECHNIQUE: Multidetector CT imaging of the chest was performed using the standard protocol during bolus administration of intravenous contrast. Multiplanar CT image reconstructions and MIPs were obtained to evaluate the vascular anatomy. CONTRAST:  115m ISOVUE-370 IOPAMIDOL (ISOVUE-370) INJECTION 76% COMPARISON:  Current chest radiographs.  Prior chest CT, 10/05/2017. FINDINGS: Cardiovascular: There is satisfactory opacification of the pulmonary arteries to the segmental level. Study is somewhat degraded by respiratory motion, particularly affecting evaluation of the lower segmental and subsegmental pulmonary arteries. Allowing for the respiratory motion, there is no evidence of a pulmonary embolism. Heart is normal in size and configuration. No pericardial effusion. Mild left coronary artery calcifications. Aorta is normal in caliber. Mild descending thoracic aortic atherosclerotic disease. Mediastinum/Nodes: No neck base or axillary masses or adenopathy. No mediastinal masses or adenopathy. Left hilum  is unremarkable. Soft tissue surrounds the right infrahilar structures. The bronchus intermedius is occluded. Soft  tissue attenuation material surrounds the occluded bronchus intermedius and lower lobe bronchus abutting the mediastinal pleura adjacent to the right heart. Lungs/Pleura: There is partial atelectasis of the right lower and middle lobes. Patchy peribronchovascular opacities are noted in the aerated portions of the right lower and middle lobes and in the left lower lobe, which may also be atelectasis. Superimposed pneumonia is possible. Stable changes of moderate centrilobular emphysema. No new masses or suspicious nodules. No pleural effusion or pneumothorax. Upper Abdomen: No acute findings. There is a biliary stent, partly imaged, new since the prior CT. Musculoskeletal: No fracture or acute finding. No osteoblastic or osteolytic lesions. Review of the MIP images confirms the above findings. IMPRESSION: 1. No evidence of a pulmonary embolism. 2. Atelectasis is noted in the right lower lobe and right middle lobe which is decreased in degree when compared to the prior CT. There is still occlusion of the bronchus intermedius presumably from tumor, with surrounding soft tissue consistent with the known malignancy. This has also improved. At the level of the left atrium at the entrance of the inferior right pulmonary vein, the soft tissue currently measures 3.4 x 1.9 cm transversely, previously 3.5 x 2.4 cm. 3. There is patchy peribronchovascular type opacities in both lower lobes and in the right middle lobe, consent of atelectasis, superimposed pneumonia or a combination. 4. No other evidence of an acute abnormality. 5. Aortic atherosclerosis.  Emphysema. Aortic Atherosclerosis (ICD10-I70.0) and Emphysema (ICD10-J43.9). Electronically Signed   By: Lajean Manes M.D.   On: 12/19/2017 14:43   Nm Pet Image Restag (ps) Skull Base To Thigh  Result Date: 12/26/2017 CLINICAL DATA:  Subsequent treatment strategy for extensive stage small cell lung cancer. EXAM: NUCLEAR MEDICINE PET SKULL BASE TO THIGH TECHNIQUE: 5.8 mCi  F-18 FDG was injected intravenously. Full-ring PET imaging was performed from the skull base to thigh after the radiotracer. CT data was obtained and used for attenuation correction and anatomic localization. Fasting blood glucose: 110 mg/dl COMPARISON:  PET-CT 10/09/2017. Abdominal MRI 10/15/2017. Chest CT 12/19/2017. FINDINGS: Mediastinal blood pool activity: SUV max 1.7 NECK: No hypermetabolic cervical lymph nodes are identified.There are no lesions of the pharyngeal mucosal space. Incidental CT findings: Bilateral carotid atherosclerosis. CHEST: There are no residual enlarged or hypermetabolic mediastinal or hilar lymph nodes. The previously demonstrated right infrahilar hypermetabolic activity has nearly completely resolved, now with an SUV max of 2.7 (previously 9.9). There is persistent occlusion of the bronchus intermedius with right infrahilar pulmonary opacity. There is no new pulmonary hypermetabolic activity. There are no new or enlarging pulmonary nodules. Incidental CT findings: Right IJ Port-A-Cath extends to the superior cavoatrial junction. There is aortic and branch vessel atherosclerosis. Moderate emphysema noted. No significant pleural effusion. ABDOMEN/PELVIS: There are no residual hypermetabolic lesions within the liver, pancreas or adrenal glands. Biliary stent has been placed with resulting pneumobilia. There is no hypermetabolic nodal activity. Incidental CT findings: Aortic and branch vessel atherosclerosis. Mild distal colonic diverticulosis. No ascites. SKELETON: Interval significant improvement in the previously demonstrated widespread hypermetabolic osseous metastatic disease. The greatest remaining hypermetabolic activity is within the upper thoracic spine (SUV max 3.8). No definite new lesions. L2 fracture and associated osseous retropulsion are grossly stable. Some of the osseous metastases are more sclerotic, consistent with interval treatment. Incidental CT findings: none  IMPRESSION: 1. Marked improvement in the previously demonstrated widespread hypermetabolic metastatic disease. There is only mild residual hypermetabolic activity  within some of the osseous lesions. No residual nodal, hepatic, pancreatic or adrenal disease identified. No disease progression identified. 2. Persistent occlusion of the bronchus intermedius with right infrahilar pulmonary opacity. 3. Pneumobilia status post biliary stenting. 4. Aortic Atherosclerosis (ICD10-I70.0) and Emphysema (ICD10-J43.9). Electronically Signed   By: Richardean Sale M.D.   On: 12/26/2017 16:30    MRI Lumbar Spine 09/25/17  IMPRESSION:  1. Extensive marrow signal abnormality throughout the osseous structures of the visualized thoracolumbar spine, sacrum, and iliac bones concerning for metastases, multiple myeloma, or other nonspecific marrow infiltrative disorder. Suggest obtaining a PET/CT to evaluate extent of disease/evaluate for a primary tumor.  2. L2 compression fracture, concerning for pathologic fracture given the extensive marrow abnormality with approximately 40% vertebral body height loss and 8.51m of retropulsion resulting in moderate narrowing of the spinal canal. A postcontrast MRI with fat saturation would help to evaluate the enhancements pattern of the bony disease and evaluate for potential abnormal soft issue extending beyond the cortex which is not definitively evident in this exam.  3. Moderate facet arthrophy with grade 1 anterolisthesis at L4-5.  Degenerative disc disease with moderate left foraminal stenosis at L5-S1.     ASSESSMENT & PLAN:   SJAMELIA VARANOis a 67y.o. caucasian female with   1. 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  Liver mets + Bone mets + adrenal mets Pancreatic mets- FNA confirmed MRI brain  10/29/2017 after this clinic visit showed concern for multiple asymptomatic subcentimeter brain mets. 11/28/17 Brain MRI which revealed stable lesions, and new nonenhancing lesions. I discussed this imaging study with Dr VUlis Riasour neuro-oncologist -- no area of critical brain mets that needs patient to rush into WBRT at this time.  Plan: -Carboplatin/Etoposide/Atezolizumab with neulasta support -Continue medications for thrush. (fluconazole) - to prevent recurrence of signiificant thrush. -Continue boost or ensure and increase PO nutrition.  -Take preventative Diflucan to prevent risk of thrush at next cycle, and to take it on days that she does not receive chemo. -Take Ativan at night prn for anxiety. -Recommend walk around 20-30 minutes each day.  -Discussed pt labwork today, 12/29/17; WBC are normalizing from 25k to 12k, better anemia with Hg at 10.9, Alk Phos at 386.  -Discussed 12/26/17 PET which revealed Marked improvement in the previously demonstrated widespread hypermetabolic metastatic disease. There is only mild residual hypermetabolic activity within some of the osseous lesions.  -Pneumonia has satisfactorily resolved and pt is good to continue with C4.  -The pt has no prohibitive toxicities from continuing with treatment at this time.  -Repeat Brain MRI after C4.  -Stop chemotherapy after C4 in favor of brain radiation. -Maintenance Atezolizumab after C4. -Follow up with Dr. MLisbeth Renshawin IR tomorrow   2. 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 RHarley Hallmarkwith much improved LFTs -Bx of  pancreatic lesion -consistent with small cell lung cancer.  5. Back pain related to pathologic L2 fracture (causing elevated alkaline phosphatase  levels. -continue using back brace -continue prn Oxycodone with GI prophylaxis -will hold of on interventional procedure to L2 at this time. -Per patient's clinical presentation; her hip  and leg pain is likely nerve pinching due to compression fractures.  -conitnue Oxycontin, and keeping short term Oxycodone prn. -Opiate related constipation: advised increasing Senna S to twice a day and using Miralax as well; one time OTC Magnesium Citrate if she still cannot move bowels.   5. Smoking history, productive cough  Smoked for 50 years, 1 pack a day  Plan: -Smoking cessation  6. 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 Harley Hallmark  Resolved hyperbilirubinemia  7. Moderate to severe protein malnutrition due to weight loss -nutritional consultation   -Patient to continue Atezolizumab q3weeks as per orders (Chemotherapy held after C4) -f/u with rad onc per appointment -MRI Brain in 2 weeks  -RTC with Dr Irene Limbo in 3 weeks with next round of Atezolizumab with labs   All of the patients questions were answered with apparent satisfaction. The patient knows to call the clinic with any problems, questions or concerns.  . The total time spent in the appointment was 25 minutes and more than 50% was on counseling and direct patient cares.     Sullivan Lone MD [place] AAHIVMS [facility] Hematology/Oncology Physician [facility]  (Office):       [phone] (Work cell):  [phone] (Fax):           [phone]  12/29/2017 10:10 AM  This document serves as a record of services personally performed by Sullivan Lone, MD. It was created on his behalf by Baldwin Jamaica, a trained medical scribe. The creation of this record is based on the scribe's personal observations and the provider's statements to them.   .I have reviewed the above documentation for accuracy and completeness, and I agree with the above. Brunetta Genera MD MS

## 2017-12-29 ENCOUNTER — Inpatient Hospital Stay: Payer: Medicare Other

## 2017-12-29 ENCOUNTER — Inpatient Hospital Stay: Payer: Medicare Other | Attending: Hematology | Admitting: Hematology

## 2017-12-29 ENCOUNTER — Encounter: Payer: Self-pay | Admitting: Hematology

## 2017-12-29 VITALS — BP 123/75 | HR 88 | Temp 98.5°F | Resp 22 | Wt 120.5 lb

## 2017-12-29 DIAGNOSIS — Z7189 Other specified counseling: Secondary | ICD-10-CM

## 2017-12-29 DIAGNOSIS — C787 Secondary malignant neoplasm of liver and intrahepatic bile duct: Secondary | ICD-10-CM | POA: Diagnosis not present

## 2017-12-29 DIAGNOSIS — K831 Obstruction of bile duct: Secondary | ICD-10-CM | POA: Insufficient documentation

## 2017-12-29 DIAGNOSIS — E871 Hypo-osmolality and hyponatremia: Secondary | ICD-10-CM | POA: Diagnosis not present

## 2017-12-29 DIAGNOSIS — E43 Unspecified severe protein-calorie malnutrition: Secondary | ICD-10-CM | POA: Diagnosis not present

## 2017-12-29 DIAGNOSIS — Z5112 Encounter for antineoplastic immunotherapy: Secondary | ICD-10-CM | POA: Diagnosis not present

## 2017-12-29 DIAGNOSIS — Z5111 Encounter for antineoplastic chemotherapy: Secondary | ICD-10-CM | POA: Diagnosis present

## 2017-12-29 DIAGNOSIS — C3490 Malignant neoplasm of unspecified part of unspecified bronchus or lung: Secondary | ICD-10-CM | POA: Diagnosis not present

## 2017-12-29 DIAGNOSIS — M8580 Other specified disorders of bone density and structure, unspecified site: Secondary | ICD-10-CM | POA: Insufficient documentation

## 2017-12-29 DIAGNOSIS — K869 Disease of pancreas, unspecified: Secondary | ICD-10-CM | POA: Diagnosis not present

## 2017-12-29 DIAGNOSIS — J439 Emphysema, unspecified: Secondary | ICD-10-CM | POA: Insufficient documentation

## 2017-12-29 DIAGNOSIS — Z87891 Personal history of nicotine dependence: Secondary | ICD-10-CM | POA: Diagnosis not present

## 2017-12-29 DIAGNOSIS — C7931 Secondary malignant neoplasm of brain: Secondary | ICD-10-CM

## 2017-12-29 DIAGNOSIS — C7951 Secondary malignant neoplasm of bone: Secondary | ICD-10-CM

## 2017-12-29 DIAGNOSIS — Z79899 Other long term (current) drug therapy: Secondary | ICD-10-CM | POA: Insufficient documentation

## 2017-12-29 DIAGNOSIS — Z7689 Persons encountering health services in other specified circumstances: Secondary | ICD-10-CM | POA: Diagnosis not present

## 2017-12-29 DIAGNOSIS — M4807 Spinal stenosis, lumbosacral region: Secondary | ICD-10-CM | POA: Insufficient documentation

## 2017-12-29 DIAGNOSIS — C7970 Secondary malignant neoplasm of unspecified adrenal gland: Secondary | ICD-10-CM | POA: Diagnosis not present

## 2017-12-29 LAB — CMP (CANCER CENTER ONLY)
ALT: 27 U/L (ref 0–55)
ANION GAP: 7 (ref 3–11)
AST: 20 U/L (ref 5–34)
Albumin: 3.6 g/dL (ref 3.5–5.0)
Alkaline Phosphatase: 386 U/L — ABNORMAL HIGH (ref 40–150)
BILIRUBIN TOTAL: 0.4 mg/dL (ref 0.2–1.2)
BUN: 14 mg/dL (ref 7–26)
CO2: 25 mmol/L (ref 22–29)
Calcium: 7.7 mg/dL — ABNORMAL LOW (ref 8.4–10.4)
Chloride: 102 mmol/L (ref 98–109)
Creatinine: 0.57 mg/dL — ABNORMAL LOW (ref 0.60–1.10)
GFR, Est AFR Am: >60 mL/min (ref >60)
GLUCOSE: 76 mg/dL (ref 70–140)
POTASSIUM: 4.1 mmol/L (ref 3.5–5.1)
Sodium: 134 mmol/L — ABNORMAL LOW (ref 136–145)
TOTAL PROTEIN: 6.6 g/dL (ref 6.4–8.3)

## 2017-12-29 LAB — CBC WITH DIFFERENTIAL (CANCER CENTER ONLY)
BASOS ABS: 0.1 10*3/uL (ref 0.0–0.1)
Basophils Relative: 1 %
Eosinophils Absolute: 0 10*3/uL (ref 0.0–0.5)
Eosinophils Relative: 0 %
HEMATOCRIT: 34.3 % — AB (ref 34.8–46.6)
HEMOGLOBIN: 10.9 g/dL — AB (ref 11.6–15.9)
LYMPHS PCT: 19 %
Lymphs Abs: 2.5 10*3/uL (ref 0.9–3.3)
MCH: 31.6 pg (ref 25.1–34.0)
MCHC: 31.8 g/dL (ref 31.5–36.0)
MCV: 99.4 fL (ref 79.5–101.0)
Monocytes Absolute: 1.4 10*3/uL — ABNORMAL HIGH (ref 0.1–0.9)
Monocytes Relative: 11 %
NEUTROS ABS: 8.9 10*3/uL — AB (ref 1.5–6.5)
NEUTROS PCT: 69 %
Platelet Count: 237 10*3/uL (ref 145–400)
RBC: 3.45 MIL/uL — ABNORMAL LOW (ref 3.70–5.45)
RDW: 21.2 % — ABNORMAL HIGH (ref 11.2–14.5)
WBC: 12.8 10*3/uL — AB (ref 3.9–10.3)
nRBC: 2 /100 WBC — ABNORMAL HIGH

## 2017-12-29 LAB — TSH: TSH: 2.937 u[IU]/mL (ref 0.308–3.960)

## 2017-12-29 MED ORDER — SODIUM CHLORIDE 0.9 % IV SOLN
Freq: Once | INTRAVENOUS | Status: AC
  Administered 2017-12-29: 11:00:00 via INTRAVENOUS
  Filled 2017-12-29: qty 5

## 2017-12-29 MED ORDER — PALONOSETRON HCL INJECTION 0.25 MG/5ML
INTRAVENOUS | Status: AC
  Filled 2017-12-29: qty 5

## 2017-12-29 MED ORDER — SODIUM CHLORIDE 0.9 % IV SOLN
1200.0000 mg | Freq: Once | INTRAVENOUS | Status: AC
  Administered 2017-12-29: 1200 mg via INTRAVENOUS
  Filled 2017-12-29: qty 20

## 2017-12-29 MED ORDER — DEXAMETHASONE 4 MG PO TABS: 4.0000 mg | ORAL_TABLET | Freq: Every day | ORAL | 0 refills | Status: DC

## 2017-12-29 MED ORDER — PALONOSETRON HCL INJECTION 0.25 MG/5ML
0.2500 mg | Freq: Once | INTRAVENOUS | Status: AC
  Administered 2017-12-29: 0.25 mg via INTRAVENOUS

## 2017-12-29 MED ORDER — SODIUM CHLORIDE 0.9% FLUSH
10.0000 mL | INTRAVENOUS | Status: DC | PRN
  Administered 2017-12-29: 10 mL
  Filled 2017-12-29: qty 10

## 2017-12-29 MED ORDER — SODIUM CHLORIDE 0.9 % IV SOLN
100.0000 mg/m2 | Freq: Once | INTRAVENOUS | Status: AC
  Administered 2017-12-29: 150 mg via INTRAVENOUS
  Filled 2017-12-29: qty 7.5

## 2017-12-29 MED ORDER — SODIUM CHLORIDE 0.9% FLUSH
10.0000 mL | Freq: Once | INTRAVENOUS | Status: AC
  Administered 2017-12-29: 10 mL
  Filled 2017-12-29: qty 10

## 2017-12-29 MED ORDER — SODIUM CHLORIDE 0.9 % IV SOLN
356.5000 mg | Freq: Once | INTRAVENOUS | Status: AC
  Administered 2017-12-29: 360 mg via INTRAVENOUS
  Filled 2017-12-29: qty 36

## 2017-12-29 MED ORDER — HEPARIN SOD (PORK) LOCK FLUSH 100 UNIT/ML IV SOLN
500.0000 [IU] | Freq: Once | INTRAVENOUS | Status: AC | PRN
Start: 2017-12-29 — End: 2017-12-29
  Administered 2017-12-29: 500 [IU]
  Filled 2017-12-29: qty 5

## 2017-12-29 MED ORDER — SODIUM CHLORIDE 0.9 % IV SOLN
Freq: Once | INTRAVENOUS | Status: AC
  Administered 2017-12-29: 11:00:00 via INTRAVENOUS

## 2017-12-29 NOTE — Progress Notes (Signed)
Thoracic Location of Tumor / Histology: Metastatic small cell lung cancer: brain, bone, pancreas, liver and adrenal mets.  Patient presented with several issues since October 2018.  Colitis in 05/2017 with associated diarrhea and abdominal cramps (now resolving) and bronchitis treated with albuterol.  08/07/2017 she notes having back pain in her lower back for a couple months.  Went to PCP and had xray done, told she had arthritis, treated with prednisone and gabapentin with no improvements.  Saw orthopedist and had lumbar MRI done.  PET 12/26/2017:  1. Marked improvement in the previously demonstrated widespread hypermetabolic metastatic disease. There is only mild residual hypermetabolic activity within some of the osseous lesions. No residual nodal, hepatic, pancreatic or adrenal disease identified. No disease progression identified. 2. Persistent occlusion of the bronchus intermedius with right infrahilar pulmonary opacity. 3. Pneumobilia status post biliary stenting. 4. Aortic Atherosclerosis (ICD10-I70.0) and Emphysema  CTA Chest 12/19/2017: No evidence of a pulmonary embolism. Atelectasis is noted in the right lower lobe and right middle lobe which is decreased in degree when compared to prior CT.  There is still occlusion of the bronchus intermedius presumably from tumor, with surrounding soft tissue consistent with the known malignancy. This has also improved. At the level of the left atrium at the entrance of the inferior right pulmonary vein, the soft tissue currently measures 3.4 x 1.9 cm transversely, previously 3.5 x 2.4 cm.  MRI Brain 11/28/2017: 1. New nonenhancing subcentimeter supra-and infratentorial intraparenchymal metastasis. 2. Stable number of enhancing subcentimeter intraparenchymal metastasis, which vary from stable to decreased in size. 3. Multiple osseous metastasis.  MRI L spine 09/24/2017: Compression fracture of L2 with abnormalities in her bone marrow.  Biopsies of    Tobacco/Marijuana/Snuff/ETOH use: Former, quit Jan 2019.  Past/Anticipated interventions by medical oncology, if any:  Dr. Irene Limbo 12/20/2017 -Discussed pt labwork today 12/04/17; Hgb at 8.7, will transfuse if Hgb <8 or significant fatigue.  -Discussed 11/28/17 Brain MRI which revealed stable lesions, and new nonenhancing lesions. I discussed this imaging study with Dr Ulis Rias our neuro-oncologist -- no area of critical brain mets that needs patient to rush into WBRT at this time. -Will refer pt to rad onc today for future planning for brain RT hopefully after c4 to achieve adequate systemic disease control. -Will repeat PET and MRI before C4 -Pt will continue with C3 and has no prohibitive toxicities from her treatment.   -Carboplatin/Etoposide/Atezolizumab with neulasta support -Continue medications for thrush. (fluconazole) - to prevent recurrence of signiificant thrush. -Continue boost or ensure and increase PO nutrition.  -Take preventative Diflucan to prevent risk of thrush at next cycle, and to take it on days that she does not receive chemo. -Take Ativan at night prn for anxiety. -Recommend walk around 20-30 minutes each day.  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 Harley Hallmark, Resolved hyperbilirubinemia.  Ordered Tests Pending scheduing MRI Brain W WO Contrast IR Bone Tumor RF Ablation CT Bone Marrow biopsy & aspiration CT biopsy  BP 126/75 (BP Location: Left Arm, Patient Position: Sitting, Cuff Size: Normal)   Pulse 84   Temp 98.6 F (37 C) (Oral)   Ht 5' 3"  (1.6 m)   Wt 124 lb 3.2 oz (56.3 kg)   SpO2 94%   BMI 22.00 kg/m    Wt Readings from Last 3 Encounters:  12/30/17 124 lb 3.2 oz (56.3 kg)  12/29/17 120 lb 8 oz (54.7 kg)  12/20/17 112 lb (50.8 kg)    Signs/Symptoms  Weight changes, if any: No  Respiratory complaints, if any: SOB with exertion.  Worsened now due to PNA.  Hemoptysis, if any: Productive cough  with white/yellow mucus.  Pain issues, if any:  Back pain  SAFETY ISSUES:  Prior radiation? No  Pacemaker/ICD? No  Possible current pregnancy? No  Is the patient on methotrexate? No  Current Complaints / other details:

## 2017-12-29 NOTE — Patient Instructions (Signed)
Thank you for choosing [facility] to provide your oncology and hematology care.  To afford each patient quality time with our providers, please arrive 30 minutes before your scheduled appointment time.  If you arrive late for your appointment, you may be asked to reschedule.  We strive to give you quality time with our providers, and arriving late affects you and other patients whose appointments are after yours.   If you are a no show for multiple scheduled visits, you may be dismissed from the clinic at the providers discretion.    Again, thank you for choosing [facility], our hope is that these requests will decrease the amount of time that you wait before being seen by our physicians.  ______________________________________________________________________  Should you have questions after your visit to the [facility], please contact our office at [phone] between the hours of 8:30 and 4:30 p.m.    Voicemails left after 4:30p.m will not be returned until the following business day.    For prescription refill requests, please have your pharmacy contact us directly.  Please also try to allow 48 hours for prescription requests.    Please contact the scheduling department for questions regarding scheduling.  For scheduling of procedures such as PET scans, CT scans, MRI, Ultrasound, etc please contact central scheduling at [phone].    Resources For Cancer Patients and Caregivers:   Oncolink.org:  A wonderful resource for patients and healthcare providers for information regarding your disease, ways to tract your treatment, what to expect, etc.     American Cancer Society:  800-227-2345  Can help patients locate various types of support and financial assistance  Cancer Care: 1-800-813-HOPE (4673) Provides financial assistance, online support groups, medication/co-pay assistance.    [place] DSS:  336-641-3447 Where to apply for food  stamps, Medicaid, and utility assistance  Medicare Rights Center: 800-333-4114 Helps people with Medicare understand their rights and benefits, navigate the Medicare system, and secure the quality healthcare they deserve  SCAT: 336-333-6589 [employer] [employer]'s shared-ride transportation service for eligible riders who have a disability that prevents them from riding the fixed route bus.    For additional information on assistance programs please contact our social worker:   Grier Hock/Abigail Elmore:  [phone]            

## 2017-12-29 NOTE — Patient Instructions (Signed)
[facility] Discharge Instructions for Patients Receiving Chemotherapy  Today you received the following chemotherapy agents Carboplatin,Etoposide,Tecentriq  To help prevent nausea and vomiting after your treatment, we encourage you to take your nausea medication as directed   If you develop nausea and vomiting that is not controlled by your nausea medication, call the clinic.   BELOW ARE SYMPTOMS THAT SHOULD BE REPORTED IMMEDIATELY:  *FEVER GREATER THAN 100.5 F  *CHILLS WITH OR WITHOUT FEVER  NAUSEA AND VOMITING THAT IS NOT CONTROLLED WITH YOUR NAUSEA MEDICATION  *UNUSUAL SHORTNESS OF BREATH  *UNUSUAL BRUISING OR BLEEDING  TENDERNESS IN MOUTH AND THROAT WITH OR WITHOUT PRESENCE OF ULCERS  *URINARY PROBLEMS  *BOWEL PROBLEMS  UNUSUAL RASH Items with * indicate a potential emergency and should be followed up as soon as possible.  Feel free to call the clinic should you have any questions or concerns. The clinic phone number is [phone].  Please show the [place] at check-in to the Emergency Department and triage nurse.

## 2017-12-30 ENCOUNTER — Ambulatory Visit
Admit: 2017-12-30 | Discharge: 2017-12-30 | Disposition: A | Discharge status: Home / Self Care | Payer: Medicare Other | Attending: Radiation Oncology | Admitting: Radiation Oncology

## 2017-12-30 ENCOUNTER — Inpatient Hospital Stay: Payer: Medicare Other

## 2017-12-30 ENCOUNTER — Other Ambulatory Visit: Payer: Self-pay

## 2017-12-30 ENCOUNTER — Encounter: Payer: Self-pay | Admitting: Radiation Oncology

## 2017-12-30 ENCOUNTER — Ambulatory Visit
Admission: RE | Admit: 2017-12-30 | Discharge: 2017-12-30 | Disposition: A | Discharge status: Home / Self Care | Payer: Medicare Other | Source: Ambulatory Visit | Attending: Radiation Oncology | Admitting: Radiation Oncology

## 2017-12-30 VITALS — BP 126/75 | HR 84 | Temp 98.6°F | Ht 63.0 in | Wt 124.2 lb

## 2017-12-30 VITALS — BP 134/78 | HR 71 | Temp 98.6°F | Resp 20

## 2017-12-30 DIAGNOSIS — I6523 Occlusion and stenosis of bilateral carotid arteries: Secondary | ICD-10-CM | POA: Insufficient documentation

## 2017-12-30 DIAGNOSIS — I700 Atherosclerosis of aorta: Secondary | ICD-10-CM | POA: Insufficient documentation

## 2017-12-30 DIAGNOSIS — C3432 Malignant neoplasm of lower lobe, left bronchus or lung: Secondary | ICD-10-CM | POA: Insufficient documentation

## 2017-12-30 DIAGNOSIS — Z79899 Other long term (current) drug therapy: Secondary | ICD-10-CM | POA: Insufficient documentation

## 2017-12-30 DIAGNOSIS — C3490 Malignant neoplasm of unspecified part of unspecified bronchus or lung: Secondary | ICD-10-CM

## 2017-12-30 DIAGNOSIS — C799 Secondary malignant neoplasm of unspecified site: Secondary | ICD-10-CM

## 2017-12-30 DIAGNOSIS — C787 Secondary malignant neoplasm of liver and intrahepatic bile duct: Secondary | ICD-10-CM | POA: Diagnosis not present

## 2017-12-30 DIAGNOSIS — M8580 Other specified disorders of bone density and structure, unspecified site: Secondary | ICD-10-CM | POA: Insufficient documentation

## 2017-12-30 DIAGNOSIS — Z5112 Encounter for antineoplastic immunotherapy: Secondary | ICD-10-CM | POA: Diagnosis not present

## 2017-12-30 DIAGNOSIS — J439 Emphysema, unspecified: Secondary | ICD-10-CM | POA: Diagnosis not present

## 2017-12-30 DIAGNOSIS — Z87891 Personal history of nicotine dependence: Secondary | ICD-10-CM | POA: Diagnosis not present

## 2017-12-30 DIAGNOSIS — C7951 Secondary malignant neoplasm of bone: Secondary | ICD-10-CM

## 2017-12-30 DIAGNOSIS — J90 Pleural effusion, not elsewhere classified: Secondary | ICD-10-CM | POA: Diagnosis not present

## 2017-12-30 DIAGNOSIS — Z7189 Other specified counseling: Secondary | ICD-10-CM

## 2017-12-30 MED ORDER — HEPARIN SOD (PORK) LOCK FLUSH 100 UNIT/ML IV SOLN
500.0000 [IU] | Freq: Once | INTRAVENOUS | Status: AC | PRN
  Administered 2017-12-30: 500 [IU]
  Filled 2017-12-30: qty 5

## 2017-12-30 MED ORDER — SODIUM CHLORIDE 0.9 % IV SOLN
Freq: Once | INTRAVENOUS | Status: AC
  Administered 2017-12-30: 09:00:00 via INTRAVENOUS

## 2017-12-30 MED ORDER — SODIUM CHLORIDE 0.9% FLUSH
10.0000 mL | INTRAVENOUS | Status: DC | PRN
  Administered 2017-12-30: 10 mL
  Filled 2017-12-30: qty 10

## 2017-12-30 MED ORDER — SODIUM CHLORIDE 0.9 % IV SOLN
100.0000 mg/m2 | Freq: Once | INTRAVENOUS | Status: AC
  Administered 2017-12-30: 150 mg via INTRAVENOUS
  Filled 2017-12-30: qty 7.5

## 2017-12-30 MED ORDER — DEXAMETHASONE SODIUM PHOSPHATE 10 MG/ML IJ SOLN
INTRAMUSCULAR | Status: AC
  Filled 2017-12-30: qty 1

## 2017-12-30 MED ORDER — DEXAMETHASONE SODIUM PHOSPHATE 10 MG/ML IJ SOLN
10.0000 mg | Freq: Once | INTRAMUSCULAR | Status: AC
  Administered 2017-12-30: 10 mg via INTRAVENOUS

## 2017-12-30 NOTE — Progress Notes (Signed)
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  Also with pancreatic mass with stent placement on 10/24/17  Met with patient and sister during infusion this am.  Patient looks and reports feels good this am.  Noted hospital admission 4/26-4/29 for pneumonia.  Reports poor appetite during admission although intake has picked up after being home.  Sister has been experimenting with new recipes and planning to make bread pudding soon.  Patient continues to drink ensure shakes and eat good sources of protein  Medications: decadron  Labs: reviewed  Anthropometrics:   Weight increased to 120 lb 8 oz today from 115 lb 4.8 oz on 4/16  UBW 125 lb   NUTRITION DIAGNOSIS: severe malnutrition improved   MALNUTRITION DIAGNOSIS: severe malnutrition improved   INTERVENTION:   Encouraged patient to continue with good nutrition.   Encouraged patient to continue to ensure shakes for added nutrition.  Patient did not need additional case today.     MONITORING, EVALUATION, GOAL: po intake, weight trends   NEXT VISIT: as needed  Haley [name], [place], [place] Registered Dietitian [phone] (pager)

## 2017-12-30 NOTE — Progress Notes (Addendum)
Radiation Oncology         [phone] ________________________________  Name: Haley [name]        MRN: [id number]  Date of Service: 12/30/2017 DOB: August 24, 1951  QH:[id number], Modena Nunnery, MD  Brunetta Genera, MD     REFERRING PHYSICIAN: Brunetta Genera, MD   DIAGNOSIS: The primary encounter diagnosis was Metastatic cancer [facility]). 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   HISTORY OF PRESENT ILLNESS: Haley [name] is a 67 y.o. female seen at the request of Dr. 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 The patient was treated in October 2018 for colitis and developed terrible low back pain in December 2018. She was seen by a provider in urgent care and was treated with gabapentin and steroids for presumed pinched nerve. She did not notice improvement in her symptoms and on 09/24/17 she was found to have compression deformity of L2 with retropulsion of the superior endplate resulting in canal stenosis, no compression was noted or cord edema. She also had concerns for multiple hepatic metastases and left adrenal mass. Staging PET on 10/09/17 revealed hypermetabolism in the right hilum, mediastinum, liver, head of pancreas, intra and extrahepatic biliary duct dilatation and bone metastases within the L2, L3 region and left humeral head. She had hypermetabolism in the left acetabulum and left femoral neck as well. A liver biopsy on 10/17/17 confirmed small cell carcinoma. She also had an ERCP with biliary stent placement at [facility] with Dr. Delrae Alfred after failed attempt here in [place]. She began cisplatin/etoposide on 10/27/17. She completes chemotherapy tomorrow, and will break for radiotherapy. During the process she was also found to have about 12 metastatic lesions in her brain on MRI on 10/29/17. She had repeat MRI brain on 11/28/17  which revealed multiple subcentimeter brain metastases without mass effect or edema. She also had a repeat PET scan on 12/26/17 that showed significant improvement in her disease in the chest, and peripancreatic region as well as sclerotic change in the lumbar spine. She comes today to discuss options of radiotherapy to the brain.   PREVIOUS RADIATION THERAPY: No   PAST MEDICAL HISTORY:  Past Medical History:  Diagnosis Date  . Allergy   . Osteopenia        PAST SURGICAL HISTORY: Past Surgical History:  Procedure Laterality Date  . COLONOSCOPY    . COLONOSCOPY WITH PROPOFOL N/A 10/25/2014   Procedure: COLONOSCOPY WITH PROPOFOL;  Surgeon: Garlan Fair, MD;  Location: [facility] ENDOSCOPY;  Service: Endoscopy;  Laterality: N/A;  . ERCP N/A 10/15/2017   Procedure: ENDOSCOPIC RETROGRADE CHOLANGIOPANCREATOGRAPHY (ERCP);  Surgeon: Clarene Essex, MD;  Location: [facility];  Service: Endoscopy;  Laterality: N/A;  . IR FLUORO GUIDE PORT INSERTION RIGHT  11/10/2017  . IR RADIOLOGIST EVAL & MGMT  10/02/2017  . IR US GUIDE VASC ACCESS RIGHT  11/10/2017     FAMILY HISTORY:  Family History  Problem Relation Age of Onset  . Diabetes Mother   . Hypertension Mother   . Diabetes Sister   . Hypertension Sister   . Hyperlipidemia Sister      SOCIAL HISTORY:  reports that she quit smoking about 3 months ago. Her smoking use included cigarettes. She has a 40.00 pack-year smoking history. She has never used smokeless tobacco. She reports that she drinks alcohol. She reports that she does not  use drugs.   ALLERGIES: Patient has no known allergies.   MEDICATIONS:  Current Outpatient Medications  Medication Sig Dispense Refill  . calcium carbonate (OS-CAL - DOSED IN MG OF ELEMENTAL CALCIUM) 1250 (500 Ca) MG tablet Take 1 tablet by mouth 2 (two) times daily with a meal.    . dexamethasone (DECADRON) 4 MG tablet Take 1 tablet (4 mg total) by mouth daily with breakfast. 20 tablet 0  . fexofenadine (ALLEGRA)  60 MG tablet Take 60 mg by mouth daily as needed for allergies or rhinitis.    . fluconazole (DIFLUCAN) 100 MG tablet Take 1 tablet (100 mg total) by mouth daily. On all days except for 5 days during chemotherapy and until neulasta shot. 30 tablet 1  . lidocaine-prilocaine (EMLA) cream Apply to affected area once 30 g 3  . LORazepam (ATIVAN) 0.5 MG tablet Take 1 tablet (0.5 mg total) by mouth every 6 (six) hours as needed (Nausea or vomiting). 30 tablet 0  . Multiple Vitamins-Minerals (MULTIVITAMIN WITH MINERALS) tablet Take 1 tablet by mouth daily.    . ondansetron (ZOFRAN) 4 MG tablet Take 1 tablet (4 mg total) by mouth every 8 (eight) hours as needed for nausea or vomiting. 20 tablet 0  . oxyCODONE (OXYCONTIN) 15 mg 12 hr tablet Take 1 tablet (15 mg total) by mouth every 12 (twelve) hours. 60 tablet 0  . polyethylene glycol (MIRALAX) packet Take 17 g by mouth daily as needed for moderate constipation. 30 each 0  . prochlorperazine (COMPAZINE) 10 MG tablet Take 1 tablet (10 mg total) by mouth every 6 (six) hours as needed (Nausea or vomiting). 30 tablet 1  . senna-docusate (SENNA S) 8.6-50 MG tablet Take 2 tablets by mouth at bedtime. 60 tablet 1  . magic mouthwash w/lidocaine SOLN Take 10 mLs by mouth 4 (four) times daily as needed for mouth pain. Rinse and spit or swallow (Patient not taking: Reported on 12/30/2017) 240 mL 2   No current facility-administered medications for this encounter.    Facility-Administered Medications Ordered in Other Encounters  Medication Dose Route Frequency Provider Last Rate Last Dose  . acetaminophen (TYLENOL) tablet 650 mg  650 mg Oral Once Curt Bears, MD      . diphenhydrAMINE (BENADRYL) capsule 25 mg  25 mg Oral Once Curt Bears, MD         REVIEW OF SYSTEMS: On review of systems, the patient reports that She is doing well overall. She denies any headaches, visual or auditory disturbances. She denies any chest pain, shortness of breath, cough,  fevers, chills, night sweats, unintended weight changes. She denies any bowel or bladder disturbances, and denies abdominal pain, nausea or vomiting. She denies any new musculoskeletal or joint aches or pains. A complete review of systems is obtained and is otherwise negative.     PHYSICAL EXAM:  Wt Readings from Last 3 Encounters:  12/30/17 124 lb 3.2 oz (56.3 kg)  12/29/17 120 lb 8 oz (54.7 kg)  12/20/17 112 lb (50.8 kg)   Temp Readings from Last 3 Encounters:  12/30/17 98.6 F (37 C) (Oral)  12/30/17 98.6 F (37 C) (Oral)  12/29/17 98.5 F (36.9 C) (Oral)   BP Readings from Last 3 Encounters:  12/30/17 126/75  12/30/17 134/78  12/29/17 123/75   Pulse Readings from Last 3 Encounters:  12/30/17 84  12/30/17 71  12/29/17 88   Pain Assessment Pain Score: 5 (Back Pain- compressed disc, taking oxycodone daily)/10  In general this is a well  appearing caucasian in no acute distress. She's alert and oriented x4 and appropriate throughout the examination. Cardiopulmonary assessment is negative for acute distress and she exhibits normal effort.     ECOG = 1  0 - Asymptomatic (Fully active, able to carry on all predisease activities without restriction)  1 - Symptomatic but completely ambulatory (Restricted in physically strenuous activity but ambulatory and able to carry out work of a light or sedentary nature. For example, light housework, office work)  2 - Symptomatic, <50% in bed during the day (Ambulatory and capable of all self care but unable to carry out any work activities. Up and about more than 50% of waking hours)  3 - Symptomatic, >50% in bed, but not bedbound (Capable of only limited self-care, confined to bed or chair 50% or more of waking hours)  4 - Bedbound (Completely disabled. Cannot carry on any self-care. Totally confined to bed or chair)  5 - Death   Eustace Pen MM, Creech RH, Tormey DC, et al. [phone]). "Toxicity and response criteria of the [facility]". [place] Oncol. 5 (6): 649-55    LABORATORY DATA:  Lab Results  Component Value Date   WBC 12.8 (H) 12/29/2017   HGB 10.9 (L) 12/29/2017   HCT 34.3 (L) 12/29/2017   MCV 99.4 12/29/2017   PLT 237 12/29/2017   Lab Results  Component Value Date   NA 134 (L) 12/29/2017   K 4.1 12/29/2017   CL 102 12/29/2017   CO2 25 12/29/2017   Lab Results  Component Value Date   ALT 27 12/29/2017   AST 20 12/29/2017   ALKPHOS 386 (H) 12/29/2017   BILITOT 0.4 12/29/2017      RADIOGRAPHY: Dg Chest 2 View  Result Date: 12/19/2017 CLINICAL DATA:  Shortness of breath. Reported history of lung carcinoma EXAM: CHEST - 2 VIEW COMPARISON:  Chest radiograph October 05, 2017 and PET-CT October 09, 2017 FINDINGS: There is patchy consolidation in the right lower lobe with small right pleural effusion. Lungs elsewhere are clear. Heart size and pulmonary vascular normal. No adenopathy evident. Port-A-Cath tip is in the superior vena cava. No well-defined blastic or lytic bone lesions are evident by radiography. There is evidence of old trauma involving the lateral right clavicle. IMPRESSION: Consolidation right base. Underlying mass in this area cannot be excluded by radiography. Small right pleural effusion. Left lung clear. Heart size normal. No adenopathy appreciable by radiography. Port-A-Cath tip in superior vena cava. Electronically Signed   By: Lowella Grip III M.D.   On: 12/19/2017 13:05   Ct Angio Chest Pe W And/or Wo Contrast  Result Date: 12/19/2017 CLINICAL DATA:  Short of breath for 1 week. History of lung carcinoma diagnosed in February 2019. Currently on chemotherapy. EXAM: CT ANGIOGRAPHY CHEST WITH CONTRAST TECHNIQUE: Multidetector CT imaging of the chest was performed using the standard protocol during bolus administration of intravenous contrast. Multiplanar CT image reconstructions and MIPs were obtained to evaluate the vascular anatomy. CONTRAST:  131mL ISOVUE-370  IOPAMIDOL (ISOVUE-370) INJECTION 76% COMPARISON:  Current chest radiographs.  Prior chest CT, 10/05/2017. FINDINGS: Cardiovascular: There is satisfactory opacification of the pulmonary arteries to the segmental level. Study is somewhat degraded by respiratory motion, particularly affecting evaluation of the lower segmental and subsegmental pulmonary arteries. Allowing for the respiratory motion, there is no evidence of a pulmonary embolism. Heart is normal in size and configuration. No pericardial effusion. Mild left coronary artery calcifications. Aorta is normal in caliber. Mild descending thoracic aortic  atherosclerotic disease. Mediastinum/Nodes: No neck base or axillary masses or adenopathy. No mediastinal masses or adenopathy. Left hilum is unremarkable. Soft tissue surrounds the right infrahilar structures. The bronchus intermedius is occluded. Soft tissue attenuation material surrounds the occluded bronchus intermedius and lower lobe bronchus abutting the mediastinal pleura adjacent to the right heart. Lungs/Pleura: There is partial atelectasis of the right lower and middle lobes. Patchy peribronchovascular opacities are noted in the aerated portions of the right lower and middle lobes and in the left lower lobe, which may also be atelectasis. Superimposed pneumonia is possible. Stable changes of moderate centrilobular emphysema. No new masses or suspicious nodules. No pleural effusion or pneumothorax. Upper Abdomen: No acute findings. There is a biliary stent, partly imaged, new since the prior CT. Musculoskeletal: No fracture or acute finding. No osteoblastic or osteolytic lesions. Review of the MIP images confirms the above findings. IMPRESSION: 1. No evidence of a pulmonary embolism. 2. Atelectasis is noted in the right lower lobe and right middle lobe which is decreased in degree when compared to the prior CT. There is still occlusion of the bronchus intermedius presumably from tumor, with surrounding  soft tissue consistent with the known malignancy. This has also improved. At the level of the left atrium at the entrance of the inferior right pulmonary vein, the soft tissue currently measures 3.4 x 1.9 cm transversely, previously 3.5 x 2.4 cm. 3. There is patchy peribronchovascular type opacities in both lower lobes and in the right middle lobe, consent of atelectasis, superimposed pneumonia or a combination. 4. No other evidence of an acute abnormality. 5. Aortic atherosclerosis.  Emphysema. Aortic Atherosclerosis (ICD10-I70.0) and Emphysema (ICD10-J43.9). Electronically Signed   By: Lajean Manes M.D.   On: 12/19/2017 14:43   Nm Pet Image Restag (ps) Skull Base To Thigh  Result Date: 12/26/2017 CLINICAL DATA:  Subsequent treatment strategy for extensive stage small cell lung cancer. EXAM: NUCLEAR MEDICINE PET SKULL BASE TO THIGH TECHNIQUE: 5.8 mCi F-18 FDG was injected intravenously. Full-ring PET imaging was performed from the skull base to thigh after the radiotracer. CT data was obtained and used for attenuation correction and anatomic localization. Fasting blood glucose: 110 mg/dl COMPARISON:  PET-CT 10/09/2017. Abdominal MRI 10/15/2017. Chest CT 12/19/2017. FINDINGS: Mediastinal blood pool activity: SUV max 1.7 NECK: No hypermetabolic cervical lymph nodes are identified.There are no lesions of the pharyngeal mucosal space. Incidental CT findings: Bilateral carotid atherosclerosis. CHEST: There are no residual enlarged or hypermetabolic mediastinal or hilar lymph nodes. The previously demonstrated right infrahilar hypermetabolic activity has nearly completely resolved, now with an SUV max of 2.7 (previously 9.9). There is persistent occlusion of the bronchus intermedius with right infrahilar pulmonary opacity. There is no new pulmonary hypermetabolic activity. There are no new or enlarging pulmonary nodules. Incidental CT findings: Right IJ Port-A-Cath extends to the superior cavoatrial junction. There  is aortic and branch vessel atherosclerosis. Moderate emphysema noted. No significant pleural effusion. ABDOMEN/PELVIS: There are no residual hypermetabolic lesions within the liver, pancreas or adrenal glands. Biliary stent has been placed with resulting pneumobilia. There is no hypermetabolic nodal activity. Incidental CT findings: Aortic and branch vessel atherosclerosis. Mild distal colonic diverticulosis. No ascites. SKELETON: Interval significant improvement in the previously demonstrated widespread hypermetabolic osseous metastatic disease. The greatest remaining hypermetabolic activity is within the upper thoracic spine (SUV max 3.8). No definite new lesions. L2 fracture and associated osseous retropulsion are grossly stable. Some of the osseous metastases are more sclerotic, consistent with interval treatment. Incidental CT findings: none IMPRESSION: 1.  Marked improvement in the previously demonstrated widespread hypermetabolic metastatic disease. There is only mild residual hypermetabolic activity within some of the osseous lesions. No residual nodal, hepatic, pancreatic or adrenal disease identified. No disease progression identified. 2. Persistent occlusion of the bronchus intermedius with right infrahilar pulmonary opacity. 3. Pneumobilia status post biliary stenting. 4. Aortic Atherosclerosis (ICD10-I70.0) and Emphysema (ICD10-J43.9). Electronically Signed   By: Richardean Sale M.D.   On: 12/26/2017 16:30       IMPRESSION/PLAN: 1. Extensive stage small cell carcinoma of the lung. Dr. Lisbeth Renshaw discusses the pathology findings and reviews the nature of small cell carcinoma. He reviews the rationale for whole brain radiotherapy and she will proceed with Tecentriq. We discussed the risks, benefits, short, and long term effects of radiotherapy, and the patient is interested in proceeding. Dr. Lisbeth Renshaw discusses the delivery and logistics of radiotherapy and anticipates a course of 2 weeks of radiotherapy.  Written consent is obtained and placed in the chart, a copy was provided to the patient. She will simulate this week and begin treatment next week.  2.  Lumbar spine disease. At this point Dr. Irene Limbo is not anticipating local therapy and we will hold off for now but if she becomes more symptomatic, we would consider radiotherapy to this site.  In a visit lasting 60 minutes, greater than 50% of the time was spent face to face discussing her case, and coordinating the patient's care.   The above documentation reflects my direct findings during this shared patient visit. Please see the separate note by Dr. Lisbeth Renshaw on this date for the remainder of the patient's plan of care.    Carola Rhine, PAC

## 2017-12-30 NOTE — Patient Instructions (Signed)
[facility] Discharge Instructions for Patients Receiving Chemotherapy  Today you received the following chemotherapy agents Etoposide  To help prevent nausea and vomiting after your treatment, we encourage you to take your nausea medication as directed  If you develop nausea and vomiting that is not controlled by your nausea medication, call the clinic.   BELOW ARE SYMPTOMS THAT SHOULD BE REPORTED IMMEDIATELY:  *FEVER GREATER THAN 100.5 F  *CHILLS WITH OR WITHOUT FEVER  NAUSEA AND VOMITING THAT IS NOT CONTROLLED WITH YOUR NAUSEA MEDICATION  *UNUSUAL SHORTNESS OF BREATH  *UNUSUAL BRUISING OR BLEEDING  TENDERNESS IN MOUTH AND THROAT WITH OR WITHOUT PRESENCE OF ULCERS  *URINARY PROBLEMS  *BOWEL PROBLEMS  UNUSUAL RASH Items with * indicate a potential emergency and should be followed up as soon as possible.  Feel free to call the clinic should you have any questions or concerns. The clinic phone number is [phone].  Please show the [facility] at check-in to the Emergency Department and triage nurse.

## 2017-12-31 ENCOUNTER — Encounter: Payer: Self-pay | Admitting: General Practice

## 2017-12-31 ENCOUNTER — Inpatient Hospital Stay: Payer: Medicare Other

## 2017-12-31 VITALS — BP 122/81 | HR 75 | Temp 98.0°F | Resp 18 | Wt 123.8 lb

## 2017-12-31 DIAGNOSIS — Z5112 Encounter for antineoplastic immunotherapy: Secondary | ICD-10-CM | POA: Diagnosis not present

## 2017-12-31 DIAGNOSIS — C3490 Malignant neoplasm of unspecified part of unspecified bronchus or lung: Secondary | ICD-10-CM

## 2017-12-31 DIAGNOSIS — Z7189 Other specified counseling: Secondary | ICD-10-CM

## 2017-12-31 MED ORDER — DEXAMETHASONE SODIUM PHOSPHATE 10 MG/ML IJ SOLN
10.0000 mg | Freq: Once | INTRAMUSCULAR | Status: AC
  Administered 2017-12-31: 10 mg via INTRAVENOUS

## 2017-12-31 MED ORDER — DEXAMETHASONE SODIUM PHOSPHATE 10 MG/ML IJ SOLN
INTRAMUSCULAR | Status: AC
  Filled 2017-12-31: qty 1

## 2017-12-31 MED ORDER — HEPARIN SOD (PORK) LOCK FLUSH 100 UNIT/ML IV SOLN
500.0000 [IU] | Freq: Once | INTRAVENOUS | Status: AC | PRN
  Administered 2017-12-31: 500 [IU]
  Filled 2017-12-31: qty 5

## 2017-12-31 MED ORDER — SODIUM CHLORIDE 0.9% FLUSH
10.0000 mL | INTRAVENOUS | Status: DC | PRN
  Administered 2017-12-31: 10 mL
  Filled 2017-12-31: qty 10

## 2017-12-31 MED ORDER — SODIUM CHLORIDE 0.9 % IV SOLN
Freq: Once | INTRAVENOUS | Status: AC
  Administered 2017-12-31: 09:00:00 via INTRAVENOUS

## 2017-12-31 MED ORDER — SODIUM CHLORIDE 0.9 % IV SOLN
100.0000 mg/m2 | Freq: Once | INTRAVENOUS | Status: AC
  Administered 2017-12-31: 150 mg via INTRAVENOUS
  Filled 2017-12-31: qty 7.5

## 2017-12-31 NOTE — Patient Instructions (Signed)
[facility] Discharge Instructions for Patients Receiving Chemotherapy  Today you received the following chemotherapy agents Etoposide.   To help prevent nausea and vomiting after your treatment, we encourage you to take your nausea medication as directed.  If you develop nausea and vomiting that is not controlled by your nausea medication, call the clinic.   BELOW ARE SYMPTOMS THAT SHOULD BE REPORTED IMMEDIATELY:  *FEVER GREATER THAN 100.5 F  *CHILLS WITH OR WITHOUT FEVER  NAUSEA AND VOMITING THAT IS NOT CONTROLLED WITH YOUR NAUSEA MEDICATION  *UNUSUAL SHORTNESS OF BREATH  *UNUSUAL BRUISING OR BLEEDING  TENDERNESS IN MOUTH AND THROAT WITH OR WITHOUT PRESENCE OF ULCERS  *URINARY PROBLEMS  *BOWEL PROBLEMS  UNUSUAL RASH Items with * indicate a potential emergency and should be followed up as soon as possible.  Feel free to call the clinic should you have any questions or concerns. The clinic phone number is [phone].  Please show the [place] at check-in to the Emergency Department and triage nurse.

## 2017-12-31 NOTE — Progress Notes (Signed)
[place] Psychosocial Distress Screening Clinical Social Work  Clinical Social Work was referred by distress screening protocol.  The patient scored a 5 on the Psychosocial Distress Thermometer which indicates moderate distress. Clinical Social Worker Edwyna Shell to assess for distress and other psychosocial needs. CSW and patient discussed common feeling and emotions when being diagnosed with cancer, and the importance of support during treatment. CSW informed patient of the support team and support services at [facility]. CSW provided contact information and encouraged patient to call with any questions or concerns. "Im feeling pretty good right now with my family backing me."  No concerns re transportation or help at home.  Will reach out to nutritionist if needed.    ONCBCN DISTRESS SCREENING 12/30/2017  Screening Type Initial Screening  Distress experienced in past week (1-10) 5  Emotional problem type Adjusting to illness  Physical Problem type Pain    Clinical Social Worker follow up needed: No.  If yes, follow up plan:   Edwyna Shell, LCSW Clinical Social Worker Phone:  [phone]

## 2018-01-02 ENCOUNTER — Inpatient Hospital Stay: Payer: Medicare Other

## 2018-01-02 ENCOUNTER — Telehealth: Payer: Self-pay

## 2018-01-02 VITALS — BP 130/75 | HR 80 | Temp 97.7°F | Resp 18

## 2018-01-02 DIAGNOSIS — Z7189 Other specified counseling: Secondary | ICD-10-CM

## 2018-01-02 DIAGNOSIS — Z5112 Encounter for antineoplastic immunotherapy: Secondary | ICD-10-CM | POA: Diagnosis not present

## 2018-01-02 DIAGNOSIS — C3490 Malignant neoplasm of unspecified part of unspecified bronchus or lung: Secondary | ICD-10-CM

## 2018-01-02 MED ORDER — PEGFILGRASTIM-CBQV 6 MG/0.6ML ~~LOC~~ SOSY
6.0000 mg | PREFILLED_SYRINGE | Freq: Once | SUBCUTANEOUS | Status: AC
  Administered 2018-01-02: 6 mg via SUBCUTANEOUS

## 2018-01-02 MED ORDER — DENOSUMAB 120 MG/1.7ML ~~LOC~~ SOLN
SUBCUTANEOUS | Status: AC
  Filled 2018-01-02: qty 1.7

## 2018-01-02 MED ORDER — PEGFILGRASTIM-CBQV 6 MG/0.6ML ~~LOC~~ SOSY
PREFILLED_SYRINGE | SUBCUTANEOUS | Status: AC
  Filled 2018-01-02: qty 0.6

## 2018-01-02 NOTE — Telephone Encounter (Signed)
Printed a copy of her upcoming appointment. Per 5/10 patient request

## 2018-01-08 ENCOUNTER — Ambulatory Visit
Admission: RE | Admit: 2018-01-08 | Discharge: 2018-01-08 | Disposition: A | Discharge status: Home / Self Care | Payer: Medicare Other | Source: Ambulatory Visit | Attending: Radiation Oncology | Admitting: Radiation Oncology

## 2018-01-08 DIAGNOSIS — Z510 Encounter for antineoplastic radiation therapy: Secondary | ICD-10-CM | POA: Insufficient documentation

## 2018-01-08 DIAGNOSIS — C7931 Secondary malignant neoplasm of brain: Secondary | ICD-10-CM | POA: Diagnosis not present

## 2018-01-08 NOTE — Progress Notes (Signed)
  Radiation Oncology         [phone]) [phone] ________________________________  Name: FRYDA MOLENDA MRN: [id number]  Date: 01/08/2018  DOB: May 22, 1951    Simulation and treatment planning note  DIAGNOSIS:     ICD-10-CM   1. Brain metastasis ([place]) C79.31      The patient presented for simulation for the patient's upcoming course of whole brain radiation treatment. The patient was placed in a supine position and a customized thermoplastic head cast was constructed to aid in patient immobilization during the treatment. This complex treatment device will be used on a daily basis. In this fashion a CT scan was obtained through the head and neck region and isocenter was placed near midline within the brain.  The patient will be planned to receive a course of whole brain radiation treatment to a dose of 30 gray in 10 fractions at 3 gray per fraction. To accomplish this, 2 customized blocks have been designed which corresponds to left and right whole brain radiation fields. These 2 complex treatment devices will be used on a daily basis during the course of radiation. A complex isodose plan is requested to insure that the target area is adequately covered in to facilitate optimization of the treatment plan. A forward planning technique will also be evaluated to determine if this approach significantly improves the plan.   ________________________________   Jodelle Gross, MD, PhD

## 2018-01-09 ENCOUNTER — Ambulatory Visit (HOSPITAL_COMMUNITY): Payer: Medicare Other

## 2018-01-09 DIAGNOSIS — Z510 Encounter for antineoplastic radiation therapy: Secondary | ICD-10-CM | POA: Diagnosis not present

## 2018-01-12 ENCOUNTER — Ambulatory Visit (HOSPITAL_COMMUNITY)
Admission: RE | Admit: 2018-01-12 | Discharge: 2018-01-12 | Disposition: A | Discharge status: Home / Self Care | Payer: Medicare Other | Source: Ambulatory Visit | Attending: Hematology | Admitting: Hematology

## 2018-01-12 DIAGNOSIS — C7951 Secondary malignant neoplasm of bone: Secondary | ICD-10-CM | POA: Insufficient documentation

## 2018-01-12 DIAGNOSIS — C3490 Malignant neoplasm of unspecified part of unspecified bronchus or lung: Secondary | ICD-10-CM

## 2018-01-12 DIAGNOSIS — C7931 Secondary malignant neoplasm of brain: Secondary | ICD-10-CM | POA: Insufficient documentation

## 2018-01-12 MED ORDER — GADOBENATE DIMEGLUMINE 529 MG/ML IV SOLN
10.0000 mL | Freq: Once | INTRAVENOUS | Status: AC | PRN
  Administered 2018-01-12: 10 mL via INTRAVENOUS

## 2018-01-15 ENCOUNTER — Ambulatory Visit
Admission: RE | Admit: 2018-01-15 | Discharge: 2018-01-15 | Disposition: A | Discharge status: Home / Self Care | Payer: Medicare Other | Source: Ambulatory Visit | Attending: Radiation Oncology | Admitting: Radiation Oncology

## 2018-01-15 DIAGNOSIS — Z510 Encounter for antineoplastic radiation therapy: Secondary | ICD-10-CM | POA: Diagnosis not present

## 2018-01-16 ENCOUNTER — Ambulatory Visit
Admission: RE | Admit: 2018-01-16 | Discharge: 2018-01-16 | Disposition: A | Discharge status: Home / Self Care | Payer: Medicare Other | Source: Ambulatory Visit | Attending: Radiation Oncology | Admitting: Radiation Oncology

## 2018-01-16 DIAGNOSIS — Z510 Encounter for antineoplastic radiation therapy: Secondary | ICD-10-CM | POA: Diagnosis not present

## 2018-01-19 NOTE — Progress Notes (Signed)
HEMATOLOGY/ONCOLOGY CLINIC NOTE  Date of Service: 01/20/18  Patient Care Team: [facility], Modena Nunnery, MD as PCP - General (Family Medicine)  CHIEF COMPLAINTS/PURPOSE OF CONSULTATION:   F/u for continued management of extensive stage small cell lung cancer.  HISTORY OF PRESENTING ILLNESS:   Haley [name] is a wonderful 67 y.o. female who has been referred to [place] by Orthopedists Dr. Vickki Hearing for evaluation and management of Bone Marrow Abnormality. Her PCP is Dr. Buelah Manis.   She presents to the clinic today accompanied by her sons and her daughter-in-law. She notes she has several issues since October 2018.  She had colitis in 05/2017 with significant diarrhea and abdominal cramping --now mostly resolved.  She was treated with antibiotics. Afterward she had bronchitis. She was given albuterol and continues to take it as she has remaining cough (smoking contributed to this).   On 08/07/17 she notes she has back pain in her lower back the past couple of months. 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 Next day she saw her physician, who had done a xray and told her she had arthritis. She was treated with prednisone and gabapentin. After no back pain improvements on treatment, she has yet to return to work. She went to orthopedist and had a Lumbar MRI on 09/25/17. This shows a compression fracture of the L2 with abnormalities in her bone marrow.   She quit smoking 09/24/17. She had been smoking 1 pack a day for 50 years, since she was 67 yo. She still has cough from smoking but not as significant.   She denies heart conditions or disease, GI issues or diseases or other significant medical issues. DM runs in her family but no known cancers, blood disorders, or gene mutations. Prior to her health in the past 6 months she has lead a independent overall healthy life. Her last mammogram in 04/2017 was negative. Last colonoscopy in 2015 and last P[place] in 2017, were negative.  She had a bone density scan in 2009. She has never been on HR therapy.   Given her significant pain (up to 10/10). She currently takes hydrocodone 1 tablet q4-5hours. This only reduced her pain to 7-8/10. She takes Tizanidine 52m q5-6hours. She has a back brace that she does not use.   On review of symptoms, pt notes ongoing b/l hip pain, lower back pain which can radiate up the side and down her legs and buttocks. The pain can keep her up at night. She notes when she lays on her right side, her throat pain which causes her to cough. This started around time of bronchitis and has not improved. When she does cough she produces clear phlegm.  Interval History:   [name] here for a scheduled follow-up of her extensive stage metastatic small cell lung cancer. She is accompanied by her two sons.   She started radiation on Thursday, 01/15/2018 and will go later today for her third treatment.   She reports new symptoms that include minor headaches, pain in her shoulders, neck, and hip. She also is experiencing fatigue. She does not think her pain is related to the arm exercises with 1 and 3 pound weights and squats that she has been doing. However, she does not that her headaches did not start until after starting radiation.   This past weekend she ate and slept. Adding her appetite has been normal. She continues to take oxycodone for her back pain.   Her hemoglobin is  11 today.   On review of systems, pt denies fever, chills, weight loss, decreased appetite, decreased energy levels, mouth sores and bilateral lower extremity edema. Denies pain. Pt denies abdominal pain, nausea, vomiting and bladder and bowel changes.    MEDICAL HISTORY:  Past Medical History:  Diagnosis Date  . Allergy   . Osteopenia     SURGICAL HISTORY: Past Surgical History:  Procedure Laterality Date  . COLONOSCOPY    . COLONOSCOPY WITH PROPOFOL N/A 10/25/2014   Procedure: COLONOSCOPY WITH PROPOFOL;  Surgeon: Garlan Fair, MD;  Location: [facility];  Service: Endoscopy;  Laterality: N/A;  . ERCP N/A 10/15/2017   Procedure: ENDOSCOPIC RETROGRADE CHOLANGIOPANCREATOGRAPHY (ERCP);  Surgeon: Clarene Essex, MD;  Location: [facility];  Service: Endoscopy;  Laterality: N/A;  . IR FLUORO GUIDE PORT INSERTION RIGHT  11/10/2017  . IR RADIOLOGIST EVAL & MGMT  10/02/2017  . IR US GUIDE VASC ACCESS RIGHT  11/10/2017    SOCIAL HISTORY: Social History   Socioeconomic History  . Marital status: Single    Spouse name: Not on file  . Number of children: Not on file  . Years of education: Not on file  . Highest education level: Not on file  Occupational History  . Not on file  Social Needs  . Financial resource strain: Not on file  . Food insecurity:    Worry: Not on file    Inability: Not on file  . Transportation needs:    Medical: Not on file    Non-medical: Not on file  Tobacco Use  . Smoking status: Former Smoker    Packs/day: 1.00    Years: 40.00    Pack years: 40.00    Types: Cigarettes    Last attempt to quit: 09/24/2017    Years since quitting: 0.3  . Smokeless tobacco: Never Used  Substance and Sexual Activity  . Alcohol use: Yes    Comment: rare social  . Drug use: No  . Sexual activity: Not Currently  Lifestyle  . Physical activity:    Days per week: Not on file    Minutes per session: Not on file  . Stress: Not on file  Relationships  . Social connections:    Talks on phone: Not on file    Gets together: Not on file    Attends religious service: Not on file    Active member of club or organization: Not on file    Attends meetings of clubs or organizations: Not on file    Relationship status: Not on file  . Intimate partner violence:    Fear of current or ex partner: Not on file    Emotionally abused: Not on file    Physically abused: Not on file    Forced sexual activity: Not on file  Other Topics Concern  . Not on file  Social History Narrative  . Not on file    FAMILY  HISTORY: Family History  Problem Relation Age of Onset  . Diabetes Mother   . Hypertension Mother   . Diabetes Sister   . Hypertension Sister   . Hyperlipidemia Sister     ALLERGIES:  has No Known Allergies.  MEDICATIONS:  Current Outpatient Medications  Medication Sig Dispense Refill  . calcium carbonate (OS-CAL - DOSED IN MG OF ELEMENTAL CALCIUM) 1250 (500 Ca) MG tablet Take 1 tablet by mouth 2 (two) times daily with a meal.    . dexamethasone (DECADRON) 4 MG tablet Take 1 tablet (4 mg total)  by mouth daily with breakfast. 20 tablet 0  . fexofenadine (ALLEGRA) 60 MG tablet Take 60 mg by mouth daily as needed for allergies or rhinitis.    . fluconazole (DIFLUCAN) 100 MG tablet Take 1 tablet (100 mg total) by mouth daily. On all days except for 5 days during chemotherapy and until neulasta shot. 30 tablet 1  . lidocaine-prilocaine (EMLA) cream Apply to affected area once 30 g 3  . Multiple Vitamins-Minerals (MULTIVITAMIN WITH MINERALS) tablet Take 1 tablet by mouth daily.    Marland Kitchen oxyCODONE (OXYCONTIN) 15 mg 12 hr tablet Take 1 tablet (15 mg total) by mouth every 12 (twelve) hours. 60 tablet 0  . senna-docusate (SENNA S) 8.6-50 MG tablet Take 2 tablets by mouth at bedtime. 60 tablet 1  . LORazepam (ATIVAN) 0.5 MG tablet Take 1 tablet (0.5 mg total) by mouth every 6 (six) hours as needed (Nausea or vomiting). (Patient not taking: Reported on 01/20/2018) 30 tablet 0  . magic mouthwash w/lidocaine SOLN Take 10 mLs by mouth 4 (four) times daily as needed for mouth pain. Rinse and spit or swallow (Patient not taking: Reported on 12/30/2017) 240 mL 2  . ondansetron (ZOFRAN) 4 MG tablet Take 1 tablet (4 mg total) by mouth every 8 (eight) hours as needed for nausea or vomiting. (Patient not taking: Reported on 01/20/2018) 20 tablet 0  . polyethylene glycol (MIRALAX) packet Take 17 g by mouth daily as needed for moderate constipation. (Patient not taking: Reported on 01/20/2018) 30 each 0  .  prochlorperazine (COMPAZINE) 10 MG tablet Take 1 tablet (10 mg total) by mouth every 6 (six) hours as needed (Nausea or vomiting). (Patient not taking: Reported on 01/20/2018) 30 tablet 1   No current facility-administered medications for this visit.    Facility-Administered Medications Ordered in Other Visits  Medication Dose Route Frequency Provider Last Rate Last Dose  . acetaminophen (TYLENOL) tablet 650 mg  650 mg Oral Once Curt Bears, MD      . diphenhydrAMINE (BENADRYL) capsule 25 mg  25 mg Oral Once Curt Bears, MD        REVIEW OF SYSTEMS:   .10 Point review of Systems was done is negative except as noted above.   PHYSICAL EXAMINATION: ECOG PERFORMANCE STATUS: 2 - Symptomatic, <50% confined to bed . Vitals:   01/20/18 0939  BP: (!) 142/77  Pulse: 91  Resp: 20  Temp: 98 F (36.7 C)  SpO2: 97%   Filed Weights   01/20/18 0939  Weight: 128 lb 1.6 oz (58.1 kg)   .Body mass index is 22.69 kg/m.  Marland Kitchen GENERAL:alert, in no acute distress and comfortable SKIN: no acute rashes, no significant lesions EYES: conjunctiva are pink and non-injected, sclera anicteric OROPHARYNX: MMM, no exudates, no oropharyngeal erythema or ulceration NECK: supple, no JVD LYMPH:  no palpable lymphadenopathy in the cervical, axillary or inguinal regions LUNGS: clear to auscultation b/l with normal respiratory effort HEART: regular rate & rhythm ABDOMEN:  normoactive bowel sounds , non tender, not distended. Extremity: no pedal edema PSYCH: alert & oriented x 3 with fluent speech NEURO: no focal motor/sensory deficits  LABORATORY DATA:   . CBC Latest Ref Rng & Units 01/20/2018 12/29/2017 12/21/2017  WBC 3.9 - 10.3 K/uL 15.2(H) 12.8(H) 25.5(H)  Hemoglobin 11.6 - 15.9 g/dL 11.0(L) 10.9(L) 9.9(L)  Hematocrit 34.8 - 46.6 % 34.1(L) 34.3(L) 31.3(L)  Platelets 145 - 400 K/uL 278 237 198  HGB 9.6  . CMP Latest Ref Rng & Units 01/20/2018 12/29/2017 12/21/2017  Glucose 65 - 99 mg/dL 165(H) 76  111(H)  BUN 6 - 20 mg/dL _0 Creatinine 0.44 - 1.00 mg/dL 0.48 0.57(L) 0.47  Sodium 135 - 145 mmol/L 130(L) 134(L) 137  Potassium 3.5 - 5.1 mmol/L 3.7 4.1 4.1  Chloride 101 - 111 mmol/L 97(L) 102 104  CO2 22 - 32 mmol/L 21(L) 25 24  Calcium 8.9 - 10.3 mg/dL 8.5(L) 7.7(L) 8.4(L)  Total Protein 6.5 - 8.1 g/dL 7.3 6.6 6.2(L)  Total Bilirubin 0.3 - 1.2 mg/dL 0.3 0.4 0.4  Alkaline Phos 38 - 126 U/L 227(H) 386(H) 445(H)  AST 15 - 41 U/L _1 ALT 14 - 54 U/L 59(H) 27 36     10/17/17 Liver needle/core biopsy   Fine Needle Aspiration, EUS, Pancreas3/12/2017 [facility] Result Narrative  ACCESSION NUMBER: [id number] RECEIVED: 10/24/2017 ORDERING PHYSICIAN: Harley Hallmark , MD PATIENT NAME: Celestia Khat A CYTOLOGY REPORT  Final Cytologic Interpretation  Pancreas, endoscopic ultrasound-guided, fine needle aspiration II (smears and cell block): 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  Specimen Adequacy:Satisfactory for evaluation.    COMMENT:The smears show clusters of neoplastic cells with neuroendocrine nuclear features and scant cytoplasm.  Immunoperoxidase studies on limited cell block material show the neoplastic cells stain positive for synaptophysin, CD56, cytokeratin AE1/AE3 (Golgi-dot like staining) and TTF-1. The MIB-1 shows a high proliferation index with positive staining in approximately 80% of the cells. Together with the cytomorphology this supports the diagnosis of small cell carcinoma. Given the presence of a suspected right infrahilar lung mass on MRI per [facility] and a pancreatic mass, I cannot be certain of the site of origin and whether this represents a primary pancreatic small cell poorly differentiated endocrine carcinoma versus a metastatic small cell carcinoma of lung origin (both can express TTF-1). Clinical correlation advised.  Dr. Romilda Garret has reviewed the case in consultation and  agrees with the diagnosis of small cell carcinoma.   The positive controls worked appropriately   "These tests were developed and their performance characteristics determined by [facility], [facility].They have not been cleared or approved by the U.S. Food and Drug Administration.The FDA has determined that such clearance or approval is not necessary.These tests are used for clinical purposes.They should not be regarded as investigational or for research.This laboratory is certified under the [employer] (CLIA) as qualified to perform high complexity clinical laboratory testing."   Report Prepared By:O.A. Sheryle Hail, M.D.  I have personally reviewed the slides and/or other related materials referenced, and have edited the report as part of my pathologic assessment and final interpretation.  Electronically Signed Out By: Blythe Stanford , MD 10/28/2017 20:55:03  sb/oah  Specimen(s) Received:  Pancreas, endoscopic ultrasound-guided, fine needle aspiration II (smears and cell block)  Clinical History obstructive jaundice  Preliminary (on-site) Interpretation  Adequate. L.Cox CT(ASCP) 10/24/17     RADIOGRAPHIC STUDIES: I have personally reviewed the radiological images as listed and agreed with the findings in the report. Mr Jeri Cos Wo Contrast  Result Date: 01/12/2018 CLINICAL DATA:  Follow-up extensive stage small cell lung cancer with brain metastases after chemotherapy. Planning for whole brain radiation therapy. EXAM: MRI HEAD WITHOUT AND WITH CONTRAST TECHNIQUE: Multiplanar, multiecho pulse sequences of the brain and surrounding structures were obtained without and with intravenous contrast. CONTRAST:  26m MULTIHANCE GADOBENATE DIMEGLUMINE 529 MG/ML IV SOLN COMPARISON:  11/28/2017 FINDINGS: Brain: Numerous small metastases are again seen throughout both cerebral hemispheres and  cerebellum. As before, these are in general most conspicuous on diffusion imaging. Given only mild enhancement of these lesions, the sagittal T1 postcontrast sequence best demonstrates the enhancing lesions as this is the most delayed sequence. While some lesions are more conspicuous on today's diffusion sequence than on the prior study, many of these lesions were present on the prior study's FLAIR sequence, however multiple lesions have mildly increased in size. Several lesions on today's diffusion sequence appear to be new as follows: 3 mm left temporal lobe (series 3, image 16), 3 mm left parietal lobe (series 3, image 33), and 4 mm left frontal lobe (series 3, image 37). There also appear to be 3 new diffusion and FLAIR hyperintense lesions in the right temporal lobe measuring 3-4 mm each. There are approximately at least 25 lesions in total. Examples of 2 enhancing lesions which have clearly increased in size include a 7 mm lesion posteriorly in the corpus callosum (series 13, image 13, previously 4 mm) and an 8 mm lesion in the left temporal lobe (series 12, image 11, previously 2 mm). A few enhancing lesions have decreased in size, for example a 4 mm anterior right frontal lesion (series 13, image 7, previously 10 mm) and a 5 mm posterior right frontal lesion (series 13, image 9, previously 10 mm). There is no substantial vasogenic edema associated with any of these metastases. No acute large territory infarct, intracranial hemorrhage, midline shift, or extra-axial fluid collection is seen. A small left frontal lobe developmental venous anomaly is again noted. The ventricles and sulci are within normal limits for age. Vascular: Major intracranial vascular flow voids are preserved. Skull and upper cervical spine: Heterogeneous T1 hypointense marrow throughout the calvarium and in the upper cervical spine consistent with previously described bone metastases. Sinuses/Orbits: Depression of the left lamina  papyracea compatible with an old medial orbital blowout fracture. Mild right maxillary and left sphenoid sinus mucosal thickening. Clear mastoid air cells. Other: None. IMPRESSION: 1. Numerous brain metastases demonstrating mixed interval changes as above. Several lesions are new and some have mildly increased in size while a few have decreased in size. No significant edema. 2. Osseous metastases. Electronically Signed   By: Logan Bores M.D.   On: 01/12/2018 11:24   Nm Pet Image Restag (ps) Skull Base To Thigh  Result Date: 12/26/2017 CLINICAL DATA:  Subsequent treatment strategy for extensive stage small cell lung cancer. EXAM: NUCLEAR MEDICINE PET SKULL BASE TO THIGH TECHNIQUE: 5.8 mCi F-18 FDG was injected intravenously. Full-ring PET imaging was performed from the skull base to thigh after the radiotracer. CT data was obtained and used for attenuation correction and anatomic localization. Fasting blood glucose: 110 mg/dl COMPARISON:  PET-CT 10/09/2017. Abdominal MRI 10/15/2017. Chest CT 12/19/2017. FINDINGS: Mediastinal blood pool activity: SUV max 1.7 NECK: No hypermetabolic cervical lymph nodes are identified.There are no lesions of the pharyngeal mucosal space. Incidental CT findings: Bilateral carotid atherosclerosis. CHEST: There are no residual enlarged or hypermetabolic mediastinal or hilar lymph nodes. The previously demonstrated right infrahilar hypermetabolic activity has nearly completely resolved, now with an SUV max of 2.7 (previously 9.9). There is persistent occlusion of the bronchus intermedius with right infrahilar pulmonary opacity. There is no new pulmonary hypermetabolic activity. There are no new or enlarging pulmonary nodules. Incidental CT findings: Right IJ Port-A-Cath extends to the superior cavoatrial junction. There is aortic and branch vessel atherosclerosis. Moderate emphysema noted. No significant pleural effusion. ABDOMEN/PELVIS: There are no residual hypermetabolic lesions  within the liver, pancreas or  adrenal glands. Biliary stent has been placed with resulting pneumobilia. There is no hypermetabolic nodal activity. Incidental CT findings: Aortic and branch vessel atherosclerosis. Mild distal colonic diverticulosis. No ascites. SKELETON: Interval significant improvement in the previously demonstrated widespread hypermetabolic osseous metastatic disease. The greatest remaining hypermetabolic activity is within the upper thoracic spine (SUV max 3.8). No definite new lesions. L2 fracture and associated osseous retropulsion are grossly stable. Some of the osseous metastases are more sclerotic, consistent with interval treatment. Incidental CT findings: none IMPRESSION: 1. Marked improvement in the previously demonstrated widespread hypermetabolic metastatic disease. There is only mild residual hypermetabolic activity within some of the osseous lesions. No residual nodal, hepatic, pancreatic or adrenal disease identified. No disease progression identified. 2. Persistent occlusion of the bronchus intermedius with right infrahilar pulmonary opacity. 3. Pneumobilia status post biliary stenting. 4. Aortic Atherosclerosis (ICD10-I70.0) and Emphysema (ICD10-J43.9). Electronically Signed   By: Richardean Sale M.D.   On: 12/26/2017 16:30    MRI Lumbar Spine 09/25/17  IMPRESSION:  1. Extensive marrow signal abnormality throughout the osseous structures of the visualized thoracolumbar spine, sacrum, and iliac bones concerning for metastases, multiple myeloma, or other nonspecific marrow infiltrative disorder. Suggest obtaining a PET/CT to evaluate extent of disease/evaluate for a primary tumor.  2. L2 compression fracture, concerning for pathologic fracture given the extensive marrow abnormality with approximately 40% vertebral body height loss and 8.34m of retropulsion resulting in moderate narrowing of the spinal canal. A postcontrast MRI with fat saturation would help to evaluate the  enhancements pattern of the bony disease and evaluate for potential abnormal soft issue extending beyond the cortex which is not definitively evident in this exam.  3. Moderate facet arthrophy with grade 1 anterolisthesis at L4-5.  Degenerative disc disease with moderate left foraminal stenosis at L5-S1.     ASSESSMENT & PLAN:   SBESAN KETCHEMis a 67y.o. caucasian female with   1. 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  Liver mets + Bone mets + adrenal mets Pancreatic mets- FNA confirmed MRI brain 10/29/2017 after this clinic visit showed concern for multiple asymptomatic subcentimeter brain mets. 11/28/17 Brain MRI which revealed stable lesions, and new nonenhancing lesions. I discussed this imaging study with Dr VUlis Riasour neuro-oncologist -- no area of critical brain mets that needs patient to rush into WBRT at this time.  12/26/17 PET which revealed Marked improvement in the previously demonstrated widespread hypermetabolic metastatic disease. There is only mild residual hypermetabolic activity within some of the osseous lesions.   Plan: --Discussed labs from today 01/20/2018 -- stable. -discussed MRI brain which showed Numerous brain metastases demonstrating mixed interval changes as above. Several lesions are new and some have mildly increased in size while a few have decreased in size. No significant edema -re-iterated results of PET/CT on 12/26/2017. -no new prohibitive treatment related toxicities -patient has completed 4 cycles of carboplatin/etoposide/Atezolizumab. -will proceed with Atezolizumab only q3weeks till progression/toxicitiy -continue f/u with Rad Onc for RT to brain mets. -Dexamethasone taper per radiation oncology -diflucan for thrush prophylaxis.  2. 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 RHarley Hallmarkwith much improved LFTs -  Bx of  pancreatic lesion -consistent with small cell lung cancer.  5. Back pain related to pathologic L2 fracture (causing elevated alkaline phosphatase levels. -continue using back brace -continue prn Oxycodone with GI prophylaxis -will hold of on interventional procedure to L2 at this time. -Per patient's clinical presentation; her hip and leg pain is likely nerve pinching due to compression fractures.  -conitnue Oxycontin, and keeping short term Oxycodone prn. -Opiate related constipation: advised increasing Senna S to twice a day and using Miralax as well; one time OTC Magnesium Citrate if she still cannot move bowels.   5. Smoking history, productive cough  Smoked for 50 years, 1 pack a day  Plan: -Smoking cessation  6. 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 Harley Hallmark  Resolved hyperbilirubinemia  7. Moderate to severe protein malnutrition due to weight loss -nutritional consultation  Continue Xgeva q4weeks Please schedule next 3 cycles of Atezolizumab RTC with Dr Irene Limbo with each Immunotherapy treatment with labs q3weeks    All of the patients questions were answered with apparent satisfaction. The patient knows to call the clinic with any problems, questions or concerns.  The total time spent in the appointment was 25 minutes and more than 50% was on counseling and direct patient cares.  Sullivan Lone MD [place] AAHIVMS [facility] Hematology/Oncology Physician [facility]  (Office):       [phone] (Work cell):  [phone] (Fax):           [phone]  01/20/2018 10:31 AM   This document serves as a record of  services personally performed by Sullivan Lone, MD. It was created on his behalf by Margit Banda, a trained medical scribe. The creation of this record is based on the scribe's personal observations and the provider's statements to them.   I have reviewed the above documentation for accuracy and completeness, and I agree with the above. Brunetta Genera MD

## 2018-01-20 ENCOUNTER — Other Ambulatory Visit: Payer: Medicare Other

## 2018-01-20 ENCOUNTER — Telehealth: Payer: Self-pay

## 2018-01-20 ENCOUNTER — Inpatient Hospital Stay: Payer: Medicare Other

## 2018-01-20 ENCOUNTER — Encounter: Payer: Self-pay | Admitting: Hematology

## 2018-01-20 ENCOUNTER — Ambulatory Visit
Admission: RE | Admit: 2018-01-20 | Discharge: 2018-01-20 | Disposition: A | Discharge status: Home / Self Care | Payer: Medicare Other | Source: Ambulatory Visit | Attending: Radiation Oncology | Admitting: Radiation Oncology

## 2018-01-20 ENCOUNTER — Ambulatory Visit: Payer: Medicare Other

## 2018-01-20 ENCOUNTER — Inpatient Hospital Stay (HOSPITAL_BASED_OUTPATIENT_CLINIC_OR_DEPARTMENT_OTHER): Payer: Medicare Other | Admitting: Hematology

## 2018-01-20 VITALS — BP 142/77 | HR 91 | Temp 98.0°F | Resp 20 | Ht 63.0 in | Wt 128.1 lb

## 2018-01-20 DIAGNOSIS — C3490 Malignant neoplasm of unspecified part of unspecified bronchus or lung: Secondary | ICD-10-CM

## 2018-01-20 DIAGNOSIS — Z87891 Personal history of nicotine dependence: Secondary | ICD-10-CM | POA: Diagnosis not present

## 2018-01-20 DIAGNOSIS — E43 Unspecified severe protein-calorie malnutrition: Secondary | ICD-10-CM

## 2018-01-20 DIAGNOSIS — J439 Emphysema, unspecified: Secondary | ICD-10-CM

## 2018-01-20 DIAGNOSIS — C7951 Secondary malignant neoplasm of bone: Secondary | ICD-10-CM

## 2018-01-20 DIAGNOSIS — K831 Obstruction of bile duct: Secondary | ICD-10-CM

## 2018-01-20 DIAGNOSIS — M4807 Spinal stenosis, lumbosacral region: Secondary | ICD-10-CM

## 2018-01-20 DIAGNOSIS — E871 Hypo-osmolality and hyponatremia: Secondary | ICD-10-CM | POA: Diagnosis not present

## 2018-01-20 DIAGNOSIS — C787 Secondary malignant neoplasm of liver and intrahepatic bile duct: Secondary | ICD-10-CM

## 2018-01-20 DIAGNOSIS — B370 Candidal stomatitis: Secondary | ICD-10-CM

## 2018-01-20 DIAGNOSIS — C7931 Secondary malignant neoplasm of brain: Secondary | ICD-10-CM | POA: Diagnosis not present

## 2018-01-20 DIAGNOSIS — Z7189 Other specified counseling: Secondary | ICD-10-CM

## 2018-01-20 DIAGNOSIS — K869 Disease of pancreas, unspecified: Secondary | ICD-10-CM

## 2018-01-20 DIAGNOSIS — R5383 Other fatigue: Secondary | ICD-10-CM

## 2018-01-20 DIAGNOSIS — C7970 Secondary malignant neoplasm of unspecified adrenal gland: Secondary | ICD-10-CM

## 2018-01-20 DIAGNOSIS — Z5112 Encounter for antineoplastic immunotherapy: Secondary | ICD-10-CM | POA: Diagnosis not present

## 2018-01-20 DIAGNOSIS — Z510 Encounter for antineoplastic radiation therapy: Secondary | ICD-10-CM | POA: Diagnosis not present

## 2018-01-20 LAB — CBC WITH DIFFERENTIAL/PLATELET
BASOS ABS: 0 10*3/uL (ref 0.0–0.1)
Basophils Relative: 0 %
EOS PCT: 0 %
Eosinophils Absolute: 0 10*3/uL (ref 0.0–0.5)
HCT: 34.1 % — ABNORMAL LOW (ref 34.8–46.6)
Hemoglobin: 11 g/dL — ABNORMAL LOW (ref 11.6–15.9)
Lymphocytes Relative: 9 %
Lymphs Abs: 1.3 10*3/uL (ref 0.9–3.3)
MCH: 32.3 pg (ref 25.1–34.0)
MCHC: 32.3 g/dL (ref 31.5–36.0)
MCV: 100 fL (ref 79.5–101.0)
MONO ABS: 0.9 10*3/uL (ref 0.1–0.9)
MONOS PCT: 6 %
Neutro Abs: 13.1 10*3/uL — ABNORMAL HIGH (ref 1.5–6.5)
Neutrophils Relative %: 85 %
PLATELETS: 278 10*3/uL (ref 145–400)
RBC: 3.41 MIL/uL — ABNORMAL LOW (ref 3.70–5.45)
RDW: 19.6 % — AB (ref 11.2–14.5)
WBC: 15.2 10*3/uL — ABNORMAL HIGH (ref 3.9–10.3)

## 2018-01-20 LAB — COMPREHENSIVE METABOLIC PANEL
ALK PHOS: 227 U/L — AB (ref 38–126)
ALT: 59 U/L — AB (ref 14–54)
AST: 21 U/L (ref 15–41)
Albumin: 3.6 g/dL (ref 3.5–5.0)
Anion gap: 12 (ref 5–15)
BILIRUBIN TOTAL: 0.3 mg/dL (ref 0.3–1.2)
BUN: 11 mg/dL (ref 6–20)
CALCIUM: 8.5 mg/dL — AB (ref 8.9–10.3)
CHLORIDE: 97 mmol/L — AB (ref 101–111)
CO2: 21 mmol/L — ABNORMAL LOW (ref 22–32)
CREATININE: 0.48 mg/dL (ref 0.44–1.00)
Glucose, Bld: 165 mg/dL — ABNORMAL HIGH (ref 65–99)
Potassium: 3.7 mmol/L (ref 3.5–5.1)
Sodium: 130 mmol/L — ABNORMAL LOW (ref 135–145)
TOTAL PROTEIN: 7.3 g/dL (ref 6.5–8.1)

## 2018-01-20 LAB — RETICULOCYTES
RBC.: 3.41 MIL/uL — ABNORMAL LOW (ref 3.70–5.45)
RETIC COUNT ABSOLUTE: 119.4 10*3/uL — AB (ref 33.7–90.7)
Retic Ct Pct: 3.5 % — ABNORMAL HIGH (ref 0.7–2.1)

## 2018-01-20 MED ORDER — SODIUM CHLORIDE 0.9 % IV SOLN
1200.0000 mg | Freq: Once | INTRAVENOUS | Status: AC
  Administered 2018-01-20: 1200 mg via INTRAVENOUS
  Filled 2018-01-20: qty 20

## 2018-01-20 MED ORDER — SODIUM CHLORIDE 0.9% FLUSH
10.0000 mL | Freq: Once | INTRAVENOUS | Status: AC
  Administered 2018-01-20: 10 mL
  Filled 2018-01-20: qty 10

## 2018-01-20 MED ORDER — SODIUM CHLORIDE 0.9% FLUSH
10.0000 mL | INTRAVENOUS | Status: DC | PRN
  Administered 2018-01-20: 10 mL
  Filled 2018-01-20: qty 10

## 2018-01-20 MED ORDER — SODIUM CHLORIDE 0.9 % IV SOLN
Freq: Once | INTRAVENOUS | Status: AC
  Administered 2018-01-20: 11:00:00 via INTRAVENOUS

## 2018-01-20 MED ORDER — FLUCONAZOLE 100 MG PO TABS: 100.0000 mg | ORAL_TABLET | Freq: Every day | ORAL | 0 refills | Status: DC

## 2018-01-20 MED ORDER — HEPARIN SOD (PORK) LOCK FLUSH 100 UNIT/ML IV SOLN
500.0000 [IU] | Freq: Once | INTRAVENOUS | Status: AC | PRN
  Administered 2018-01-20: 500 [IU]
  Filled 2018-01-20: qty 5

## 2018-01-20 MED ORDER — DEXAMETHASONE 4 MG PO TABS: 4.0000 mg | ORAL_TABLET | Freq: Every day | ORAL | 0 refills | Status: DC

## 2018-01-20 MED ORDER — OXYCODONE HCL ER 15 MG PO T12A: 15.0000 mg | EXTENDED_RELEASE_TABLET | Freq: Two times a day (BID) | ORAL | 0 refills | Status: DC

## 2018-01-20 NOTE — Telephone Encounter (Signed)
Pt okay to get xgeva with Ca of 8.5. Pt to start taking 1G calcium and 2000U vitamin D daily while on xgeva. Called pt and LVM to ask if she would like to get injection on a separate day or wait until next treatment to receive.

## 2018-01-20 NOTE — Patient Instructions (Signed)
[facility] Discharge Instructions for Patients Receiving Chemotherapy  Today you received the following chemotherapy agents tecentiq  To help prevent nausea and vomiting after your treatment, we encourage you to take your nausea medication as directed by your dr. If you develop nausea and vomiting that is not controlled by your nausea medication, call the clinic.   BELOW ARE SYMPTOMS THAT SHOULD BE REPORTED IMMEDIATELY:  *FEVER GREATER THAN 100.5 F  *CHILLS WITH OR WITHOUT FEVER  NAUSEA AND VOMITING THAT IS NOT CONTROLLED WITH YOUR NAUSEA MEDICATION  *UNUSUAL SHORTNESS OF BREATH  *UNUSUAL BRUISING OR BLEEDING  TENDERNESS IN MOUTH AND THROAT WITH OR WITHOUT PRESENCE OF ULCERS  *URINARY PROBLEMS  *BOWEL PROBLEMS  UNUSUAL RASH Items with * indicate a potential emergency and should be followed up as soon as possible.  Feel free to call the clinic should you have any questions or concerns. The clinic phone number is [phone].  Please show the [place] at check-in to the Emergency Department and triage nurse.  Atezolizumab injection What is this medicine? ATEZOLIZUMAB (a te zoe LIZ ue mab) is a monoclonal antibody. It is used to treat bladder cancer (urothelial cancer) and non-small cell lung cancer. This medicine may be used for other purposes; ask your health care provider or pharmacist if you have questions. COMMON BRAND NAME(S): Tecentriq What should I tell my health care provider before I take this medicine? They need to know if you have any of these conditions: -diabetes -immune system problems -infection -inflammatory bowel disease -liver disease -lung or breathing disease -lupus -nervous system problems like myasthenia gravis or Guillain-Barre syndrome -organ transplant -an unusual or allergic reaction to atezolizumab, other medicines, foods, dyes, or preservatives -pregnant or trying to get pregnant -breast-feeding How should I use  this medicine? This medicine is for infusion into a vein. It is given by a health care professional in a hospital or clinic setting. A special MedGuide will be given to you before each treatment. Be sure to read this information carefully each time. Talk to your pediatrician regarding the use of this medicine in children. Special care may be needed. Overdosage: If you think you have taken too much of this medicine contact a poison control center or emergency room at once. NOTE: This medicine is only for you. Do not share this medicine with others. What if I miss a dose? It is important not to miss your dose. Call your doctor or health care professional if you are unable to keep an appointment. What may interact with this medicine? Interactions have not been studied. This list may not describe all possible interactions. Give your health care provider a list of all the medicines, herbs, non-prescription drugs, or dietary supplements you use. Also tell them if you smoke, drink alcohol, or use illegal drugs. Some items may interact with your medicine. What should I watch for while using this medicine? Your condition will be monitored carefully while you are receiving this medicine. You may need blood work done while you are taking this medicine. Do not become pregnant while taking this medicine or for at least 5 months after stopping it. Women should inform their doctor if they wish to become pregnant or think they might be pregnant. There is a potential for serious side effects to an unborn child. Talk to your health care professional or pharmacist for more information. Do not breast-feed an infant while taking this medicine or for at least 5 months after the last dose. What  side effects may I notice from receiving this medicine? Side effects that you should report to your doctor or health care professional as soon as possible: -allergic reactions like skin rash, itching or hives, swelling of the face,  lips, or tongue -black, tarry stools -bloody or watery diarrhea -breathing problems -changes in vision -chest pain or chest tightness -chills -facial flushing -fever -headache -signs and symptoms of high blood sugar such as dizziness; dry mouth; dry skin; fruity breath; nausea; stomach pain; increased hunger or thirst; increased urination -signs and symptoms of liver injury like dark yellow or brown urine; general ill feeling or flu-like symptoms; light-colored stools; loss of appetite; nausea; right upper belly pain; unusually weak or tired; yellowing of the eyes or skin -stomach pain -trouble passing urine or change in the amount of urine Side effects that usually do not require medical attention (report to your doctor or health care professional if they continue or are bothersome): -cough -diarrhea -joint pain -muscle pain -muscle weakness -tiredness -weight loss This list may not describe all possible side effects. Call your doctor for medical advice about side effects. You may report side effects to FDA at 1-800-FDA-1088. Where should I keep my medicine? This drug is given in a hospital or clinic and will not be stored at home. NOTE: This sheet is a summary. It may not cover all possible information. If you have questions about this medicine, talk to your doctor, pharmacist, or health care provider.  2018 Elsevier/Gold Standard (2015-09-13 17:54:14)

## 2018-01-21 ENCOUNTER — Ambulatory Visit
Admission: RE | Admit: 2018-01-21 | Discharge: 2018-01-21 | Disposition: A | Discharge status: Home / Self Care | Payer: Medicare Other | Source: Ambulatory Visit | Attending: Radiation Oncology | Admitting: Radiation Oncology

## 2018-01-21 ENCOUNTER — Telehealth: Payer: Self-pay

## 2018-01-21 DIAGNOSIS — Z510 Encounter for antineoplastic radiation therapy: Secondary | ICD-10-CM | POA: Diagnosis not present

## 2018-01-21 LAB — THYROID PANEL WITH TSH
Free Thyroxine Index: 1.7 (ref 1.2–4.9)
T3 UPTAKE RATIO: 25 % (ref 24–39)
T4, Total: 6.8 ug/dL (ref 4.5–12.0)
TSH: 1.03 u[IU]/mL (ref 0.450–4.500)

## 2018-01-21 NOTE — Telephone Encounter (Signed)
Pt wanted to confirm doses for calcium and vitamin D. Per Dr. Irene Limbo, pt to take 2000U of vitamin D and 1G of calcium PO daily. LVM on pt phone with updated information.

## 2018-01-22 ENCOUNTER — Ambulatory Visit
Admission: RE | Admit: 2018-01-22 | Discharge: 2018-01-22 | Disposition: A | Discharge status: Home / Self Care | Payer: Medicare Other | Source: Ambulatory Visit | Attending: Radiation Oncology | Admitting: Radiation Oncology

## 2018-01-22 ENCOUNTER — Telehealth: Payer: Self-pay

## 2018-01-22 DIAGNOSIS — Z510 Encounter for antineoplastic radiation therapy: Secondary | ICD-10-CM | POA: Diagnosis not present

## 2018-01-22 NOTE — Telephone Encounter (Signed)
Spoke with patient concerning upcoming appointment being scheduled on Monday's and she was okay with it. Per 5/28 los

## 2018-01-23 ENCOUNTER — Ambulatory Visit
Admission: RE | Admit: 2018-01-23 | Discharge: 2018-01-23 | Disposition: A | Discharge status: Home / Self Care | Payer: Medicare Other | Source: Ambulatory Visit | Attending: Radiation Oncology | Admitting: Radiation Oncology

## 2018-01-23 ENCOUNTER — Telehealth: Payer: Self-pay

## 2018-01-23 DIAGNOSIS — Z510 Encounter for antineoplastic radiation therapy: Secondary | ICD-10-CM | POA: Diagnosis not present

## 2018-01-23 NOTE — Telephone Encounter (Signed)
Spoke with patient and she is aware of the injection on the 3rd after Rad treatment. Per sch msg follow up.

## 2018-01-26 ENCOUNTER — Ambulatory Visit
Admission: RE | Admit: 2018-01-26 | Discharge: 2018-01-26 | Disposition: A | Discharge status: Home / Self Care | Payer: Medicare Other | Source: Ambulatory Visit | Attending: Radiation Oncology | Admitting: Radiation Oncology

## 2018-01-26 ENCOUNTER — Inpatient Hospital Stay: Payer: Medicare Other | Attending: Hematology

## 2018-01-26 DIAGNOSIS — Z87891 Personal history of nicotine dependence: Secondary | ICD-10-CM | POA: Insufficient documentation

## 2018-01-26 DIAGNOSIS — M25551 Pain in right hip: Secondary | ICD-10-CM | POA: Diagnosis not present

## 2018-01-26 DIAGNOSIS — K219 Gastro-esophageal reflux disease without esophagitis: Secondary | ICD-10-CM | POA: Diagnosis not present

## 2018-01-26 DIAGNOSIS — K831 Obstruction of bile duct: Secondary | ICD-10-CM | POA: Insufficient documentation

## 2018-01-26 DIAGNOSIS — M1990 Unspecified osteoarthritis, unspecified site: Secondary | ICD-10-CM | POA: Diagnosis not present

## 2018-01-26 DIAGNOSIS — Z510 Encounter for antineoplastic radiation therapy: Secondary | ICD-10-CM | POA: Insufficient documentation

## 2018-01-26 DIAGNOSIS — C7970 Secondary malignant neoplasm of unspecified adrenal gland: Secondary | ICD-10-CM | POA: Insufficient documentation

## 2018-01-26 DIAGNOSIS — C7951 Secondary malignant neoplasm of bone: Secondary | ICD-10-CM | POA: Diagnosis not present

## 2018-01-26 DIAGNOSIS — E43 Unspecified severe protein-calorie malnutrition: Secondary | ICD-10-CM | POA: Insufficient documentation

## 2018-01-26 DIAGNOSIS — X58XXXS Exposure to other specified factors, sequela: Secondary | ICD-10-CM | POA: Insufficient documentation

## 2018-01-26 DIAGNOSIS — C7931 Secondary malignant neoplasm of brain: Secondary | ICD-10-CM | POA: Diagnosis not present

## 2018-01-26 DIAGNOSIS — R748 Abnormal levels of other serum enzymes: Secondary | ICD-10-CM | POA: Insufficient documentation

## 2018-01-26 DIAGNOSIS — R5383 Other fatigue: Secondary | ICD-10-CM | POA: Diagnosis not present

## 2018-01-26 DIAGNOSIS — S32028S Other fracture of second lumbar vertebra, sequela: Secondary | ICD-10-CM | POA: Diagnosis not present

## 2018-01-26 DIAGNOSIS — Z5112 Encounter for antineoplastic immunotherapy: Secondary | ICD-10-CM | POA: Insufficient documentation

## 2018-01-26 DIAGNOSIS — E871 Hypo-osmolality and hyponatremia: Secondary | ICD-10-CM | POA: Insufficient documentation

## 2018-01-26 DIAGNOSIS — C787 Secondary malignant neoplasm of liver and intrahepatic bile duct: Secondary | ICD-10-CM | POA: Diagnosis not present

## 2018-01-26 DIAGNOSIS — C7889 Secondary malignant neoplasm of other digestive organs: Secondary | ICD-10-CM | POA: Diagnosis not present

## 2018-01-26 DIAGNOSIS — K869 Disease of pancreas, unspecified: Secondary | ICD-10-CM | POA: Diagnosis not present

## 2018-01-26 DIAGNOSIS — M549 Dorsalgia, unspecified: Secondary | ICD-10-CM | POA: Diagnosis not present

## 2018-01-26 DIAGNOSIS — J439 Emphysema, unspecified: Secondary | ICD-10-CM | POA: Diagnosis not present

## 2018-01-26 DIAGNOSIS — M8580 Other specified disorders of bone density and structure, unspecified site: Secondary | ICD-10-CM | POA: Diagnosis not present

## 2018-01-26 DIAGNOSIS — Z7189 Other specified counseling: Secondary | ICD-10-CM

## 2018-01-26 DIAGNOSIS — E2740 Unspecified adrenocortical insufficiency: Secondary | ICD-10-CM | POA: Diagnosis not present

## 2018-01-26 DIAGNOSIS — Z79899 Other long term (current) drug therapy: Secondary | ICD-10-CM | POA: Insufficient documentation

## 2018-01-26 DIAGNOSIS — R05 Cough: Secondary | ICD-10-CM | POA: Insufficient documentation

## 2018-01-26 DIAGNOSIS — M4807 Spinal stenosis, lumbosacral region: Secondary | ICD-10-CM | POA: Diagnosis not present

## 2018-01-26 DIAGNOSIS — M25552 Pain in left hip: Secondary | ICD-10-CM | POA: Diagnosis not present

## 2018-01-26 DIAGNOSIS — C3490 Malignant neoplasm of unspecified part of unspecified bronchus or lung: Secondary | ICD-10-CM

## 2018-01-26 MED ORDER — DENOSUMAB 120 MG/1.7ML ~~LOC~~ SOLN
SUBCUTANEOUS | Status: AC
  Filled 2018-01-26: qty 1.7

## 2018-01-26 MED ORDER — DENOSUMAB 120 MG/1.7ML ~~LOC~~ SOLN
120.0000 mg | Freq: Once | SUBCUTANEOUS | Status: AC
  Administered 2018-01-26: 120 mg via SUBCUTANEOUS

## 2018-01-27 ENCOUNTER — Ambulatory Visit
Admission: RE | Admit: 2018-01-27 | Discharge: 2018-01-27 | Disposition: A | Discharge status: Home / Self Care | Payer: Medicare Other | Source: Ambulatory Visit | Attending: Radiation Oncology | Admitting: Radiation Oncology

## 2018-01-27 DIAGNOSIS — Z510 Encounter for antineoplastic radiation therapy: Secondary | ICD-10-CM | POA: Diagnosis not present

## 2018-01-28 ENCOUNTER — Ambulatory Visit
Admission: RE | Admit: 2018-01-28 | Discharge: 2018-01-28 | Disposition: A | Discharge status: Home / Self Care | Payer: Medicare Other | Source: Ambulatory Visit | Attending: Radiation Oncology | Admitting: Radiation Oncology

## 2018-01-28 DIAGNOSIS — Z510 Encounter for antineoplastic radiation therapy: Secondary | ICD-10-CM | POA: Diagnosis not present

## 2018-01-29 ENCOUNTER — Ambulatory Visit
Admission: RE | Admit: 2018-01-29 | Discharge: 2018-01-29 | Disposition: A | Discharge status: Home / Self Care | Payer: Medicare Other | Source: Ambulatory Visit | Attending: Radiation Oncology | Admitting: Radiation Oncology

## 2018-01-29 ENCOUNTER — Encounter: Payer: Self-pay | Admitting: Radiation Oncology

## 2018-01-29 DIAGNOSIS — Z510 Encounter for antineoplastic radiation therapy: Secondary | ICD-10-CM | POA: Diagnosis not present

## 2018-02-02 ENCOUNTER — Other Ambulatory Visit: Payer: Medicare Other

## 2018-02-02 DIAGNOSIS — E781 Pure hyperglyceridemia: Secondary | ICD-10-CM

## 2018-02-02 LAB — LIPID PANEL
CHOL/HDL RATIO: 3.9 (calc) (ref <5.0)
Cholesterol: 259 mg/dL — ABNORMAL HIGH (ref <200)
HDL: 67 mg/dL (ref >50)
LDL Cholesterol (Calc): 159 mg/dL (calc) — ABNORMAL HIGH
NON-HDL CHOLESTEROL (CALC): 192 mg/dL — AB (ref <130)
Triglycerides: 176 mg/dL — ABNORMAL HIGH (ref <150)

## 2018-02-02 NOTE — Progress Notes (Signed)
  Radiation Oncology         [phone]) [phone] ________________________________  Name: Haley [name] MRN: [id number]  Date: 01/29/2018  DOB: 14-Sep-1950  End of Treatment Note  Diagnosis:   67 y.o. 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  She does have some increased fatigue and back pain and is taking pain medication for her back.  Instructions were given to further taper her steroids beginning next Monday and then discontinue them one week later.   Plan: The patient has completed radiation treatment. The patient will return to radiation oncology clinic for routine followup in one month. I advised them to call or return sooner if they have any questions or concerns related to their recovery or treatment.  ------------------------------------------------  Jodelle Gross, MD, PhD  This document serves as a record of services personally performed by Kyung Rudd, MD. It was created on his behalf by Rae Lips, a trained medical scribe. The creation of this record is based on the scribe's personal observations and the provider's statements to them. This document has been checked and approved by the attending provider.

## 2018-02-04 ENCOUNTER — Ambulatory Visit (INDEPENDENT_AMBULATORY_CARE_PROVIDER_SITE_OTHER): Payer: Medicare Other | Admitting: Family Medicine

## 2018-02-04 ENCOUNTER — Encounter: Payer: Self-pay | Admitting: Family Medicine

## 2018-02-04 ENCOUNTER — Other Ambulatory Visit: Payer: Self-pay

## 2018-02-04 VITALS — BP 140/88 | HR 82 | Temp 97.9°F | Resp 14 | Ht 63.0 in | Wt 131.0 lb

## 2018-02-04 DIAGNOSIS — E782 Mixed hyperlipidemia: Secondary | ICD-10-CM | POA: Diagnosis not present

## 2018-02-04 DIAGNOSIS — C799 Secondary malignant neoplasm of unspecified site: Secondary | ICD-10-CM | POA: Diagnosis not present

## 2018-02-04 DIAGNOSIS — C3490 Malignant neoplasm of unspecified part of unspecified bronchus or lung: Secondary | ICD-10-CM

## 2018-02-04 NOTE — Patient Instructions (Addendum)
Ask about pneumonia vaccines  F/U December for Physical - as scheduled

## 2018-02-04 NOTE — Progress Notes (Signed)
Subjective:    Patient ID: Haley [name], female    DOB: [birth year], 67 y.o.   MRN: [id number]  Patient presents for Follow-up (concerned about cholesterol since she has stopped meds d/t cancer)  Patient here for follow-up.  She was referred to oncology by her orthopedist Dr. Juventino Slovak secondary to bone marrow abnormality.  Is seen secondary to chronic back pain and hip pain.  Throughout her work-up she has been diagnosed with metastatic small cell lung cancer.  She is currently on radiation treatment.  A few complications including hospitalization.  She also has L2 compression fracture thought to be associated with the cancer as well.  As well as arthritis and degenerative disc disease.  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  She also had biopsy to confirm. She then had MRI of the brain which showed numerous brain metastases.  She is following with radiation oncology as well as her regular oncologist.  Protein malnutrition diagnosed in the setting of her cancer diagnosis and weight loss.  She is worried about her cholesterol came and had lipid panel done.  She is off her cholesterol medication in the setting of all of the above.  Medications were reviewed.    Review Of Systems:  GEN- denies fatigue, fever, weight loss,weakness, recent illness HEENT- denies eye drainage, change in vision, nasal discharge, CVS- denies chest pain, palpitations RESP- denies SOB, cough, wheeze ABD- denies N/V, change in stools, abd pain GU- denies dysuria, hematuria, dribbling, incontinence MSK- +joint pain, muscle aches, injury Neuro- denies headache, dizziness, syncope, seizure activity       Objective:    BP 140/88   Pulse 82   Temp 97.9 F (36.6 C) (Oral)   Resp 14   Ht 5\' 3"  (1.6 m)   Wt 131 lb (59.4 kg)   SpO2 97%   BMI 23.21 kg/m  GEN- NAD, alert and oriented x3 HEENT- PERRL, EOMI, non injected  sclera, pink conjunctiva, MMM, oropharynx clear Neck- Supple, no thyromegaly CVS- RRR, no murmur RESP-CTAB ABD-NABS,soft,NT,ND Psych- normal affect and mood  EXT- No edema Pulses- Radial 2+        Assessment & Plan:    I have recommended that she discuss with her oncologist if she is able to get pneumonia vaccines. Problem List Items Addressed This Visit      Unprioritized   Extensive stage primary small cell carcinoma of lung [facility])    Reviewed her oncology notes.  Metastatic cancer.  She just completed radiation to the brain.  She had follow-up imaging to be done in the next 2 months.  She does not have any nausea vomiting her appetite is up because of the prednisone.  Her pain is fairly well-controlled with the OxyContin.  At this time because she does have metastatic lesions to the liver and the pancreas has a stent placed I do not recommend that she needs to go back on a statin drug for cholesterol.  We can reevaluate in about 6 months time and see how her cancer treatments are progressing.  I did review all of her labs with her in detail for the cholesterol as well as her recent metabolic and CBC.      Hyperlipidemia   Metastatic disease ([place]) - Primary      Note: This dictation was prepared with Dragon dictation along with smaller phrase technology. Any transcriptional errors that result from this process are unintentional.

## 2018-02-04 NOTE — Assessment & Plan Note (Signed)
Reviewed her oncology notes.  Metastatic cancer.  She just completed radiation to the brain.  She had follow-up imaging to be done in the next 2 months.  She does not have any nausea vomiting her appetite is up because of the prednisone.  Her pain is fairly well-controlled with the OxyContin.  At this time because she does have metastatic lesions to the liver and the pancreas has a stent placed I do not recommend that she needs to go back on a statin drug for cholesterol.  We can reevaluate in about 6 months time and see how her cancer treatments are progressing.  I did review all of her labs with her in detail for the cholesterol as well as her recent metabolic and CBC.

## 2018-02-06 NOTE — Progress Notes (Signed)
HEMATOLOGY/ONCOLOGY CLINIC NOTE  Date of Service: 02/09/18  Patient Care Team: [facility], [place] Nunnery, MD as PCP - General (Family Medicine)  CHIEF COMPLAINTS/PURPOSE OF CONSULTATION:   F/u for continued management of extensive stage small cell lung cancer.  HISTORY OF PRESENTING ILLNESS:   Haley [name] [name] is a wonderful 67 y.o. female who has been referred to [place] by Orthopedists Dr. Vickki Hearing for evaluation and management of Bone Marrow Abnormality. Her PCP is Dr. Buelah Manis.   She presents to the clinic today accompanied by her sons and her daughter-in-law. She notes she has several issues since October 2018.  She had colitis in 05/2017 with significant diarrhea and abdominal cramping --now mostly resolved.  She was treated with antibiotics. Afterward she had bronchitis. She was given albuterol and continues to take it as she has remaining cough (smoking contributed to this).   On 08/07/17 she notes she has back pain in her lower back the past couple of months. 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 Next day she saw her physician, who had done a xray and told her she had arthritis. She was treated with prednisone and gabapentin. After no back pain improvements on treatment, she has yet to return to work. She went to orthopedist and had a Lumbar MRI on 09/25/17. This shows a compression fracture of the L2 with abnormalities in her bone marrow.   She quit smoking 09/24/17. She had been smoking 1 pack a day for 50 years, since she was 67 yo. She still has cough from smoking but not as significant.   She denies heart conditions or disease, GI issues or diseases or other significant medical issues. DM runs in her family but no known cancers, blood disorders, or gene mutations. Prior to her health in the past 6 months she has lead a independent overall healthy life. Her last mammogram in 04/2017 was negative. Last colonoscopy in 2015 and last [place] in 2017, were negative.  She had a bone density scan in 2009. She has never been on HR therapy.   Given her significant pain (up to 10/10). She currently takes hydrocodone 1 tablet q4-5hours. This only reduced her pain to 7-8/10. She takes Tizanidine 77m q5-6hours. She has a back brace that she does not use.   On review of symptoms, pt notes ongoing b/l hip pain, lower back pain which can radiate up the side and down her legs and buttocks. The pain can keep her up at night. She notes when she lays on her right side, her throat pain which causes her to cough. This started around time of bronchitis and has not improved. When she does cough she produces clear phlegm.  Interval History:   SSakiyais here for a scheduled follow-up of her extensive stage metastatic small cell lung cancer. The patient's last visit with [place] on 01/20/18. She is accompanied today by her son. The pt reports that she is doing well overall.   The pt has completed radiation with Dr MLisbeth Renshawin the interim, on January 29, 2018. She notes that she has felt tired through radiation without many more complaints. She notes that she has been eating very well and her appetite continues to be strong, in the atmosphere of taking steroids as recommended by radiation. She notes that her throat continues to be a little bothersome.   She denies any skin rashes at this time. She also notes that she occasionally has some belly discomfort that she  associates with acid reflux. She is continuing to walk and tries to be active. She continues to take 1-2 oxycodone each day and her back pain is well controlled with this.   Lab results today (02/09/18) of CBC, CMP, and Reticulocytes is as follows: all values are WNL except for RDW at 16.6, Sodium at 1234, Chloride at 90. Magnesium 02/09/18 is WNL at 1.7.   On review of systems, pt reports throat discomfort, stable weight, strong appetite, controlled back pain, acid reflux, and denies skin rashes, and any other symptoms.    MEDICAL  HISTORY:  Past Medical History:  Diagnosis Date  . Allergy   . Osteopenia     SURGICAL HISTORY: Past Surgical History:  Procedure Laterality Date  . COLONOSCOPY    . COLONOSCOPY WITH PROPOFOL N/A 10/25/2014   Procedure: COLONOSCOPY WITH PROPOFOL;  Surgeon: Garlan Fair, MD;  Location: [facility];  Service: Endoscopy;  Laterality: N/A;  . ERCP N/A 10/15/2017   Procedure: ENDOSCOPIC RETROGRADE CHOLANGIOPANCREATOGRAPHY (ERCP);  Surgeon: Clarene Essex, MD;  Location: [facility];  Service: Endoscopy;  Laterality: N/A;  . IR FLUORO GUIDE PORT INSERTION RIGHT  11/10/2017  . IR RADIOLOGIST EVAL & MGMT  10/02/2017  . IR US GUIDE VASC ACCESS RIGHT  11/10/2017    SOCIAL HISTORY: Social History   Socioeconomic History  . Marital status: Single    Spouse name: Not on file  . Number of children: Not on file  . Years of education: Not on file  . Highest education level: Not on file  Occupational History  . Not on file  Social Needs  . Financial resource strain: Not on file  . Food insecurity:    Worry: Not on file    Inability: Not on file  . Transportation needs:    Medical: Not on file    Non-medical: Not on file  Tobacco Use  . Smoking status: Former Smoker    Packs/day: 1.00    Years: 40.00    Pack years: 40.00    Types: Cigarettes    Last attempt to quit: 09/24/2017    Years since quitting: 0.3  . Smokeless tobacco: Never Used  Substance and Sexual Activity  . Alcohol use: Yes    Comment: rare social  . Drug use: No  . Sexual activity: Not Currently  Lifestyle  . Physical activity:    Days per week: Not on file    Minutes per session: Not on file  . Stress: Not on file  Relationships  . Social connections:    Talks on phone: Not on file    Gets together: Not on file    Attends religious service: Not on file    Active member of club or organization: Not on file    Attends meetings of clubs or organizations: Not on file    Relationship status: Not on file  .  Intimate partner violence:    Fear of current or ex partner: Not on file    Emotionally abused: Not on file    Physically abused: Not on file    Forced sexual activity: Not on file  Other Topics Concern  . Not on file  Social History Narrative  . Not on file    FAMILY HISTORY: Family History  Problem Relation Age of Onset  . Diabetes Mother   . Hypertension Mother   . Diabetes Sister   . Hypertension Sister   . Hyperlipidemia Sister     ALLERGIES:  has No Known Allergies.  MEDICATIONS:  Current Outpatient Medications  Medication Sig Dispense Refill  . calcium carbonate (OS-CAL - DOSED IN MG OF ELEMENTAL CALCIUM) 1250 (500 Ca) MG tablet Take 1 tablet by mouth 2 (two) times daily with a meal.    . dexamethasone (DECADRON) 4 MG tablet Take 1 tablet (4 mg total) by mouth daily with breakfast. 20 tablet 0  . fexofenadine (ALLEGRA) 60 MG tablet Take 60 mg by mouth daily as needed for allergies or rhinitis.    Marland Kitchen lidocaine-prilocaine (EMLA) cream Apply to affected area once 30 g 3  . LORazepam (ATIVAN) 0.5 MG tablet Take 1 tablet (0.5 mg total) by mouth every 6 (six) hours as needed (Nausea or vomiting). 30 tablet 0  . magic mouthwash w/lidocaine SOLN Take 10 mLs by mouth 4 (four) times daily as needed for mouth pain. Rinse and spit or swallow 240 mL 2  . Multiple Vitamins-Minerals (MULTIVITAMIN WITH MINERALS) tablet Take 1 tablet by mouth daily.    . ondansetron (ZOFRAN) 4 MG tablet Take 1 tablet (4 mg total) by mouth every 8 (eight) hours as needed for nausea or vomiting. 20 tablet 0  . oxyCODONE (OXYCONTIN) 15 mg 12 hr tablet Take 1 tablet (15 mg total) by mouth every 12 (twelve) hours. 60 tablet 0  . polyethylene glycol (MIRALAX) packet Take 17 g by mouth daily as needed for moderate constipation. 30 each 0  . prochlorperazine (COMPAZINE) 10 MG tablet Take 1 tablet (10 mg total) by mouth every 6 (six) hours as needed (Nausea or vomiting). 30 tablet 1  . senna-docusate (SENNA S)  8.6-50 MG tablet Take 2 tablets by mouth at bedtime. 60 tablet 1   No current facility-administered medications for this visit.    Facility-Administered Medications Ordered in Other Visits  Medication Dose Route Frequency Provider Last Rate Last Dose  . acetaminophen (TYLENOL) tablet 650 mg  650 mg Oral Once Curt Bears, MD      . diphenhydrAMINE (BENADRYL) capsule 25 mg  25 mg Oral Once Curt Bears, MD        REVIEW OF SYSTEMS:   A 10+ POINT REVIEW OF SYSTEMS WAS OBTAINED including neurology, dermatology, psychiatry, cardiac, respiratory, lymph, extremities, GI, GU, Musculoskeletal, constitutional, breasts, reproductive, HEENT.  All pertinent positives are noted in the HPI.  All others are negative.  PHYSICAL EXAMINATION: ECOG PERFORMANCE STATUS: 2 - Symptomatic, <50% confined to bed . Vitals:   02/09/18 0937  BP: 109/72  Pulse: 87  Resp: 18  Temp: 98.1 F (36.7 C)  SpO2: 97%   Filed Weights   02/09/18 0937  Weight: 131 lb 4.8 oz (59.6 kg)   .Body mass index is 23.26 kg/m.  GENERAL:alert, in no acute distress and comfortable SKIN: no acute rashes, no significant lesions EYES: conjunctiva are pink and non-injected, sclera anicteric OROPHARYNX: MMM, no exudates, no oropharyngeal erythema or ulceration NECK: supple, no JVD LYMPH:  no palpable lymphadenopathy in the cervical, axillary or inguinal regions LUNGS: clear to auscultation b/l with normal respiratory effort HEART: regular rate & rhythm ABDOMEN:  normoactive bowel sounds , non tender, not distended. Extremity: no pedal edema PSYCH: alert & oriented x 3 with fluent speech NEURO: no focal motor/sensory deficits   LABORATORY DATA:   . CBC Latest Ref Rng & Units 02/09/2018 02/09/2018 01/20/2018  WBC 3.9 - 10.3 K/uL 7.8 6.3 15.2(H)  Hemoglobin 11.6 - 15.9 g/dL 10.6(L) 12.0 11.0(L)  Hematocrit 34.8 - 46.6 % 31.4(L) 35.9 34.1(L)  Platelets 145 - 400 K/uL 206 198 278  HGB 9.6  . CMP Latest Ref Rng & Units  02/09/2018 02/09/2018  Glucose 70 - 140 mg/dL 177(H) 122  BUN 7 - 26 mg/dL 9 12  Creatinine 0.60 - 1.10 mg/dL 0.59(L) 0.62  Sodium 136 - 145 mmol/L 123(L) 124(L)  Potassium 3.5 - 5.1 mmol/L 3.8 3.6  Chloride 98 - 109 mmol/L 94(L) 90(L)  CO2 22 - 29 mmol/L 22 24  Calcium 8.4 - 10.4 mg/dL 7.9(L) 8.7  Total Protein 6.4 - 8.3 g/dL 6.0(L) 6.8  Total Bilirubin 0.2 - 1.2 mg/dL 0.4 0.5  Alkaline Phos 40 - 150 U/L 144 143  AST 5 - 34 U/L 18 21  ALT 0 - 55 U/L 43 51     10/17/17 Liver needle/core biopsy   Fine Needle Aspiration, EUS, Pancreas3/12/2017 [facility] Result Narrative  ACCESSION NUMBER: [id number] RECEIVED: 10/24/2017 ORDERING PHYSICIAN: Harley Hallmark , MD PATIENT NAME: Haley [name] [name]  Final Cytologic Interpretation  Pancreas, endoscopic ultrasound-guided, fine needle aspiration II (smears and cell block): 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  Specimen Adequacy:Satisfactory for evaluation.    COMMENT:The smears show clusters of neoplastic cells with neuroendocrine nuclear features and scant cytoplasm.  Immunoperoxidase studies on limited cell block material show the neoplastic cells stain positive for synaptophysin, CD56, cytokeratin AE1/AE3 (Golgi-dot like staining) and TTF-1. The MIB-1 shows a high proliferation index with positive staining in approximately 80% of the cells. Together with the cytomorphology this supports the diagnosis of small cell carcinoma. Given the presence of a suspected right infrahilar lung mass on MRI per [facility] and a pancreatic mass, I cannot be certain of the site of origin and whether this represents a primary pancreatic small cell poorly differentiated endocrine carcinoma versus a metastatic small cell carcinoma of lung origin (both can express TTF-1). Clinical correlation advised.  Dr. Romilda Garret has reviewed the case in consultation and agrees with the  diagnosis of small cell carcinoma.   The positive controls worked appropriately   "These tests were developed and their performance characteristics determined by [facility], [facility].They have not been cleared or approved by the U.S. Food and Drug Administration.The FDA has determined that such clearance or approval is not necessary.These tests are used for clinical purposes.They should not be regarded as investigational or for research.This laboratory is certified under the [place] (CLIA) as qualified to perform high complexity clinical laboratory testing."   [name] Prepared By:O.A. Sheryle Hail, M.D.  I have personally reviewed the slides and/or other related materials referenced, and have edited the [name] as part of my pathologic assessment and final interpretation.  Electronically Signed Out By: Blythe Stanford , MD 10/28/2017 20:55:03  sb/oah  Specimen(s) Received:  Pancreas, endoscopic ultrasound-guided, fine needle aspiration II (smears and cell block)  Clinical History obstructive jaundice  Preliminary (on-site) Interpretation  Adequate. L.[facility] CT(ASCP) 10/24/17     RADIOGRAPHIC STUDIES: I have personally reviewed the radiological images as listed and agreed with the findings in the [name]. Mr Jeri Cos Wo Contrast  Result Date: 01/12/2018 CLINICAL DATA:  Follow-up extensive stage small cell lung cancer with brain metastases after chemotherapy. Planning for whole brain radiation therapy. EXAM: MRI HEAD WITHOUT AND WITH CONTRAST TECHNIQUE: Multiplanar, multiecho pulse sequences of the brain and surrounding structures were obtained without and with intravenous contrast. CONTRAST:  67m MULTIHANCE GADOBENATE DIMEGLUMINE 529 MG/ML IV SOLN COMPARISON:  11/28/2017 FINDINGS: Brain: Numerous small metastases are again seen throughout both cerebral hemispheres and cerebellum.  As  before, these are in general most conspicuous on diffusion imaging. Given only mild enhancement of these lesions, the sagittal T1 postcontrast sequence best demonstrates the enhancing lesions as this is the most delayed sequence. While some lesions are more conspicuous on today's diffusion sequence than on the prior study, many of these lesions were present on the prior study's FLAIR sequence, however multiple lesions have mildly increased in size. Several lesions on today's diffusion sequence appear to be new as follows: 3 mm left temporal lobe (series 3, image 16), 3 mm left parietal lobe (series 3, image 33), and 4 mm left frontal lobe (series 3, image 37). There also appear to be 3 new diffusion and FLAIR hyperintense lesions in the right temporal lobe measuring 3-4 mm each. There are approximately at least 25 lesions in total. Examples of 2 enhancing lesions which have clearly increased in size include a 7 mm lesion posteriorly in the corpus callosum (series 13, image 13, previously 4 mm) and an 8 mm lesion in the left temporal lobe (series 12, image 11, previously 2 mm). A few enhancing lesions have decreased in size, for example a 4 mm anterior right frontal lesion (series 13, image 7, previously 10 mm) and a 5 mm posterior right frontal lesion (series 13, image 9, previously 10 mm). There is no substantial vasogenic edema associated with any of these metastases. No acute large territory infarct, intracranial hemorrhage, midline shift, or extra-axial fluid collection is seen. A small left frontal lobe developmental venous anomaly is again noted. The ventricles and sulci are within normal limits for age. Vascular: Major intracranial vascular flow voids are preserved. Skull and upper cervical spine: Heterogeneous T1 hypointense marrow throughout the calvarium and in the upper cervical spine consistent with previously described bone metastases. Sinuses/Orbits: Depression of the left lamina papyracea compatible  with an old medial orbital blowout fracture. Mild right maxillary and left sphenoid sinus mucosal thickening. Clear mastoid air cells. Other: None. IMPRESSION: 1. Numerous brain metastases demonstrating mixed interval changes as above. Several lesions are new and some have mildly increased in size while a few have decreased in size. No significant edema. 2. Osseous metastases. Electronically Signed   By: Logan Bores M.D.   On: 01/12/2018 11:24    MRI Lumbar Spine 09/25/17  IMPRESSION:  1. Extensive marrow signal abnormality throughout the osseous structures of the visualized thoracolumbar spine, sacrum, and iliac bones concerning for metastases, multiple myeloma, or other nonspecific marrow infiltrative disorder. Suggest obtaining a PET/CT to evaluate extent of disease/evaluate for a primary tumor.  2. L2 compression fracture, concerning for pathologic fracture given the extensive marrow abnormality with approximately 40% vertebral body height loss and 8.34m of retropulsion resulting in moderate narrowing of the spinal canal. A postcontrast MRI with fat saturation would help to evaluate the enhancements pattern of the bony disease and evaluate for potential abnormal soft issue extending beyond the cortex which is not definitively evident in this exam.  3. Moderate facet arthrophy with grade 1 anterolisthesis at L4-5.  Degenerative disc disease with moderate left foraminal stenosis at L5-S1.     ASSESSMENT & PLAN:   SGAIGE FUSSNERis a 67y.o. caucasian female with   1. 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  Liver mets + Bone mets + adrenal mets Pancreatic mets- FNA confirmed MRI brain 10/29/2017 after this clinic visit showed concern  for multiple asymptomatic subcentimeter brain mets. 11/28/17 Brain MRI which revealed stable lesions, and new  nonenhancing lesions. I discussed this imaging study with Dr Ulis Rias our neuro-oncologist -- no area of critical brain mets that needs patient to rush into WBRT at this time.  12/26/17 PET which revealed Marked improvement in the previously demonstrated widespread hypermetabolic metastatic disease. There is only mild residual hypermetabolic activity within some of the osseous lesions.   patient has completed 4 cycles of carboplatin/etoposide/Atezolizumab and completed -completed WBRT 30 Gy in 10 fractions 01/15/2018 - 01/29/2018  Plan: -Discussed pt labwork today, 02/09/18; blood counts have improved with normalized Hgb and WBC.  -WBRT completed with some mild fatigue and some minimal co -No thrush present -Continue staying well hydrated, eating well, and walking 20-30 minutes each day -Will repeat PET/CT and Brain MRI in August -The pt has no prohibitive toxicities from continuing Atezolizumab q3 weeks at this time.   2. 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 PLAN -recommended patient consume more salty foods -started on NACL tabs 1g po tid Will limit free water intake to 32 oz a day -dexamethasone swtiched to hydrocortisone 43m with breakfast and 524mwith lunch -rpt labs on 6/21 showed sodium levels improved from 123--> 129. -would consider demeclocycline if needed- not necessary at this time.  3.s/p obstructive Jaundice due to biliary compression from pancreatic mass  -ERCP attempted by Dr MaWatt Climes not successful -ERCP completed on 10/24/17 with Dr. RiHarley Hallmarkith much improved LFTs -Bx  of  pancreatic lesion -consistent with small cell lung cancer.  5. Back pain related to pathologic L2 fracture (causing elevated alkaline phosphatase levels. -continue using back brace -continue prn Oxycodone with GI prophylaxis -will hold of on interventional procedure to L2 at this time. -Per patient's clinical presentation; her hip and leg pain is likely nerve pinching due to compression fractures.  -conitnue Oxycontin, and keeping short term Oxycodone prn. -Opiate related constipation: advised increasing Senna S to twice a day and using Miralax as well; one time OTC Magnesium Citrate if she still cannot move bowels.   5. Smoking history, productive cough  Smoked for 50 years, 1 pack a day  Plan: -Smoking cessation  6. Moderate to severe protein malnutrition due to weight loss -resolved . Wt Readings from Last 3 Encounters:  02/09/18 131 lb 4.8 oz (59.6 kg)  02/04/18 131 lb (59.4 kg)  01/20/18 128 lb 1.6 oz (58.1 kg)    F/u in 3 weeks as per scheduled appointment with labs/teqcentric/MD Labs on Friday 02/13/2018 RTC with Dr KaIrene Limbon Monday with labs (for hyponatremia    All of the patients questions were answered with apparent satisfaction. The patient knows to call the clinic with any problems, questions or concerns.    The total time spent in the appointment was 25 minutes and more than 50% was on counseling and direct patient cares.    GaSullivan LoneD MSLofallAHIVMS [facility]CPenn [facility]  [facility] Hematology/Oncology Physician [facility]  (Office):       [phone] (Work cell):  [phone] (Fax):           [phone]  02/09/2018 10:21 AM  I, Baldwin [place], am acting as a Education administrator for Dr Irene Limbo.   .I have reviewed the above documentation for accuracy and completeness, and I agree with the above. Brunetta Genera MD

## 2018-02-09 ENCOUNTER — Inpatient Hospital Stay: Payer: Medicare Other

## 2018-02-09 ENCOUNTER — Inpatient Hospital Stay (HOSPITAL_BASED_OUTPATIENT_CLINIC_OR_DEPARTMENT_OTHER): Payer: Medicare Other | Admitting: Hematology

## 2018-02-09 ENCOUNTER — Telehealth: Payer: Self-pay | Admitting: Hematology

## 2018-02-09 ENCOUNTER — Inpatient Hospital Stay: Payer: Medicare Other | Admitting: Nutrition

## 2018-02-09 VITALS — BP 109/72 | HR 87 | Temp 98.1°F | Resp 18 | Ht 63.0 in | Wt 131.3 lb

## 2018-02-09 DIAGNOSIS — R5383 Other fatigue: Secondary | ICD-10-CM

## 2018-02-09 DIAGNOSIS — C787 Secondary malignant neoplasm of liver and intrahepatic bile duct: Secondary | ICD-10-CM | POA: Diagnosis not present

## 2018-02-09 DIAGNOSIS — E871 Hypo-osmolality and hyponatremia: Secondary | ICD-10-CM

## 2018-02-09 DIAGNOSIS — K831 Obstruction of bile duct: Secondary | ICD-10-CM

## 2018-02-09 DIAGNOSIS — C3490 Malignant neoplasm of unspecified part of unspecified bronchus or lung: Secondary | ICD-10-CM

## 2018-02-09 DIAGNOSIS — M25551 Pain in right hip: Secondary | ICD-10-CM

## 2018-02-09 DIAGNOSIS — C7951 Secondary malignant neoplasm of bone: Secondary | ICD-10-CM | POA: Diagnosis not present

## 2018-02-09 DIAGNOSIS — C7931 Secondary malignant neoplasm of brain: Secondary | ICD-10-CM

## 2018-02-09 DIAGNOSIS — E43 Unspecified severe protein-calorie malnutrition: Secondary | ICD-10-CM | POA: Diagnosis not present

## 2018-02-09 DIAGNOSIS — Z5112 Encounter for antineoplastic immunotherapy: Secondary | ICD-10-CM | POA: Diagnosis not present

## 2018-02-09 DIAGNOSIS — C7889 Secondary malignant neoplasm of other digestive organs: Secondary | ICD-10-CM

## 2018-02-09 DIAGNOSIS — K869 Disease of pancreas, unspecified: Secondary | ICD-10-CM | POA: Diagnosis not present

## 2018-02-09 DIAGNOSIS — Z7189 Other specified counseling: Secondary | ICD-10-CM

## 2018-02-09 DIAGNOSIS — C7970 Secondary malignant neoplasm of unspecified adrenal gland: Secondary | ICD-10-CM | POA: Diagnosis not present

## 2018-02-09 DIAGNOSIS — M549 Dorsalgia, unspecified: Secondary | ICD-10-CM

## 2018-02-09 DIAGNOSIS — M8580 Other specified disorders of bone density and structure, unspecified site: Secondary | ICD-10-CM

## 2018-02-09 DIAGNOSIS — M25552 Pain in left hip: Secondary | ICD-10-CM

## 2018-02-09 DIAGNOSIS — M4807 Spinal stenosis, lumbosacral region: Secondary | ICD-10-CM

## 2018-02-09 DIAGNOSIS — R05 Cough: Secondary | ICD-10-CM

## 2018-02-09 DIAGNOSIS — Z87891 Personal history of nicotine dependence: Secondary | ICD-10-CM

## 2018-02-09 DIAGNOSIS — Z79899 Other long term (current) drug therapy: Secondary | ICD-10-CM

## 2018-02-09 DIAGNOSIS — J439 Emphysema, unspecified: Secondary | ICD-10-CM

## 2018-02-09 DIAGNOSIS — M1990 Unspecified osteoarthritis, unspecified site: Secondary | ICD-10-CM

## 2018-02-09 DIAGNOSIS — K219 Gastro-esophageal reflux disease without esophagitis: Secondary | ICD-10-CM

## 2018-02-09 LAB — CMP (CANCER CENTER ONLY)
ALBUMIN: 3.2 g/dL — AB (ref 3.5–5.0)
ALK PHOS: 144 U/L (ref 40–150)
ALT: 51 U/L (ref 0–55)
ALT: 43 U/L (ref 0–55)
ANION GAP: 7 (ref 3–11)
AST: 21 U/L (ref 5–34)
AST: 18 U/L (ref 5–34)
Albumin: 3.7 g/dL (ref 3.5–5.0)
Alkaline Phosphatase: 143 U/L (ref 40–150)
Anion gap: 10 (ref 3–11)
BILIRUBIN TOTAL: 0.5 mg/dL (ref 0.2–1.2)
BILIRUBIN TOTAL: 0.4 mg/dL (ref 0.2–1.2)
BUN: 12 mg/dL (ref 7–26)
BUN: 9 mg/dL (ref 7–26)
CO2: 24 mmol/L (ref 22–29)
CO2: 22 mmol/L (ref 22–29)
CREATININE: 0.62 mg/dL (ref 0.60–1.10)
Calcium: 8.7 mg/dL (ref 8.4–10.4)
Calcium: 7.9 mg/dL — ABNORMAL LOW (ref 8.4–10.4)
Chloride: 90 mmol/L — ABNORMAL LOW (ref 98–109)
Chloride: 94 mmol/L — ABNORMAL LOW (ref 98–109)
Creatinine: 0.59 mg/dL — ABNORMAL LOW (ref 0.60–1.10)
GFR, Estimated: >60 mL/min (ref >60)
GLUCOSE: 177 mg/dL — AB (ref 70–140)
Glucose, Bld: 122 mg/dL (ref 70–140)
Potassium: 3.6 mmol/L (ref 3.5–5.1)
Potassium: 3.8 mmol/L (ref 3.5–5.1)
Sodium: 124 mmol/L — ABNORMAL LOW (ref 136–145)
Sodium: 123 mmol/L — ABNORMAL LOW (ref 136–145)
TOTAL PROTEIN: 6.8 g/dL (ref 6.4–8.3)
Total Protein: 6 g/dL — ABNORMAL LOW (ref 6.4–8.3)

## 2018-02-09 LAB — OSMOLALITY: OSMOLALITY: 264 mosm/kg — AB (ref 275–295)

## 2018-02-09 LAB — CBC WITH DIFFERENTIAL/PLATELET
BASOS PCT: 0 %
Basophils Absolute: 0 10*3/uL (ref 0.0–0.1)
Basophils Absolute: 0 10*3/uL (ref 0.0–0.1)
Basophils Relative: 0 %
EOS ABS: 0 10*3/uL (ref 0.0–0.5)
EOS ABS: 0 10*3/uL (ref 0.0–0.5)
Eosinophils Relative: 1 %
Eosinophils Relative: 0 %
HCT: 35.9 % (ref 34.8–46.6)
HEMATOCRIT: 31.4 % — AB (ref 34.8–46.6)
HEMOGLOBIN: 12 g/dL (ref 11.6–15.9)
HEMOGLOBIN: 10.6 g/dL — AB (ref 11.6–15.9)
LYMPHS ABS: 1.3 10*3/uL (ref 0.9–3.3)
Lymphocytes Relative: 21 %
Lymphocytes Relative: 13 %
Lymphs Abs: 1 10*3/uL (ref 0.9–3.3)
MCH: 31.8 pg (ref 25.1–34.0)
MCH: 32.2 pg (ref 25.1–34.0)
MCHC: 33.4 g/dL (ref 31.5–36.0)
MCHC: 33.8 g/dL (ref 31.5–36.0)
MCV: 95.2 fL (ref 79.5–101.0)
MCV: 95.4 fL (ref 79.5–101.0)
Monocytes Absolute: 0.5 10*3/uL (ref 0.1–0.9)
Monocytes Absolute: 0.4 10*3/uL (ref 0.1–0.9)
Monocytes Relative: 8 %
Monocytes Relative: 5 %
NEUTROS ABS: 4.4 10*3/uL (ref 1.5–6.5)
NEUTROS ABS: 6.4 10*3/uL (ref 1.5–6.5)
NEUTROS PCT: 70 %
NEUTROS PCT: 82 %
Platelets: 198 10*3/uL (ref 145–400)
Platelets: 206 10*3/uL (ref 145–400)
RBC: 3.77 MIL/uL (ref 3.70–5.45)
RBC: 3.29 MIL/uL — AB (ref 3.70–5.45)
RDW: 16.6 % — ABNORMAL HIGH (ref 11.2–14.5)
RDW: 16.5 % — ABNORMAL HIGH (ref 11.2–14.5)
WBC: 6.3 10*3/uL (ref 3.9–10.3)
WBC: 7.8 10*3/uL (ref 3.9–10.3)

## 2018-02-09 LAB — MAGNESIUM: Magnesium: 1.7 mg/dL (ref 1.7–2.4)

## 2018-02-09 LAB — SODIUM, URINE, RANDOM: Sodium, Ur: 82 mmol/L

## 2018-02-09 LAB — OSMOLALITY, URINE: Osmolality, Ur: 633 mOsm/kg (ref 300–900)

## 2018-02-09 LAB — TSH: TSH: 0.894 u[IU]/mL (ref 0.308–3.960)

## 2018-02-09 MED ORDER — ATEZOLIZUMAB CHEMO INJECTION 1200 MG/20ML
1200.0000 mg | Freq: Once | INTRAVENOUS | Status: AC
  Administered 2018-02-09: 1200 mg via INTRAVENOUS
  Filled 2018-02-09: qty 20

## 2018-02-09 MED ORDER — HEPARIN SOD (PORK) LOCK FLUSH 100 UNIT/ML IV SOLN
500.0000 [IU] | Freq: Once | INTRAVENOUS | Status: AC | PRN
  Administered 2018-02-09: 500 [IU]
  Filled 2018-02-09: qty 5

## 2018-02-09 MED ORDER — SODIUM CHLORIDE 0.9% FLUSH
10.0000 mL | INTRAVENOUS | Status: DC | PRN
  Administered 2018-02-09: 10 mL
  Filled 2018-02-09: qty 10

## 2018-02-09 MED ORDER — SODIUM CHLORIDE 0.9 % IV SOLN
Freq: Once | INTRAVENOUS | Status: AC
  Administered 2018-02-09: 11:00:00 via INTRAVENOUS

## 2018-02-09 MED ORDER — SODIUM CHLORIDE 1 G PO TABS: 1.0000 g | ORAL_TABLET | Freq: Three times a day (TID) | ORAL | 0 refills | Status: DC

## 2018-02-09 MED ORDER — HYDROCORTISONE 10 MG PO TABS: 10.0000 mg | ORAL_TABLET | Freq: Every day | ORAL | 0 refills | Status: DC

## 2018-02-09 MED ORDER — SODIUM CHLORIDE 0.9 % IV SOLN: INTRAVENOUS | Status: AC

## 2018-02-09 MED ORDER — SODIUM CHLORIDE 0.9% FLUSH
10.0000 mL | Freq: Once | INTRAVENOUS | Status: AC
  Administered 2018-02-09: 10 mL
  Filled 2018-02-09: qty 10

## 2018-02-09 NOTE — Patient Instructions (Addendum)
Per Dr. Irene Limbo:  * Limit free fluid oral intake. * No driving or operating heavy machinery until cleared by Dr. Irene Limbo. * Go directly to nearest Emergency Department if you experience confusion, dizziness, or seizures. * Medications have been sent to your pharmacy.  Take as directed.  Haley [name] Discharge Instructions for Patients Receiving Chemotherapy  Today you received the following chemotherapy agents:  Imfinzi.  To help prevent nausea and vomiting after your treatment, we encourage you to take your nausea medication as directed.   If you develop nausea and vomiting that is not controlled by your nausea medication, call the clinic.   BELOW ARE SYMPTOMS THAT SHOULD BE REPORTED IMMEDIATELY:  *FEVER GREATER THAN 100.5 F  *CHILLS WITH OR WITHOUT FEVER  NAUSEA AND VOMITING THAT IS NOT CONTROLLED WITH YOUR NAUSEA MEDICATION  *UNUSUAL SHORTNESS OF BREATH  *UNUSUAL BRUISING OR BLEEDING  TENDERNESS IN MOUTH AND THROAT WITH OR WITHOUT PRESENCE OF ULCERS  *URINARY PROBLEMS  *BOWEL PROBLEMS  UNUSUAL RASH Items with * indicate a potential emergency and should be followed up as soon as possible.  Feel free to call the clinic should you have any questions or concerns. The clinic phone number is [phone].  Please show the [place] at check-in to the Emergency Department and triage nurse.

## 2018-02-09 NOTE — Progress Notes (Signed)
Nutrition follow-up completed with patient during infusion for metastatic cancer. Patient has been on steroids and reports appetite is much improved. Weight increased and documented as 131.3 pounds on June 17. Patient complains of reflux and will begin Prilosec. Patient denies nutrition impact symptoms. She would like to keep her weight around 130 pounds, as this is her usual body weight.  Nutrition diagnosis: Severe mood malnutrition improved.  Intervention: Educated patient to continue smaller, more frequent meals and snacks with adequate protein for healing. Encouraged weight maintenance. Questions were answered.  Teach back method used.  Contact information given.  Monitoring, evaluation, goals: Patient will continue adequate oral intake for weight maintenance.  Next visit: Monday, July 29 during infusion.  **Disclaimer: This note was dictated with voice recognition software. Similar sounding words can inadvertently be transcribed and this note may contain transcription errors which may not have been corrected upon publication of note.**

## 2018-02-09 NOTE — Telephone Encounter (Signed)
Appointments scheduled AVS/Calendar printed per 6/17 los

## 2018-02-10 ENCOUNTER — Telehealth: Payer: Self-pay | Admitting: Hematology

## 2018-02-10 LAB — T4, FREE: FREE T4: 0.95 ng/dL (ref 0.82–1.77)

## 2018-02-10 LAB — CORTISOL: Cortisol, Plasma: 0.4 ug/dL

## 2018-02-10 NOTE — Telephone Encounter (Signed)
Appointments scheduled patient notified per 6/18 staff message

## 2018-02-11 ENCOUNTER — Telehealth: Payer: Self-pay

## 2018-02-11 NOTE — Telephone Encounter (Signed)
Received a voicemail from patient with a question for Dr. Irene Limbo. When I attempted to return patient's phone call to the number she left on voicemail [phone] the phone rang twice and then silence. I attempted to reach patient x3. Will try again later.

## 2018-02-12 ENCOUNTER — Other Ambulatory Visit: Payer: Self-pay | Admitting: *Deleted

## 2018-02-12 ENCOUNTER — Telehealth: Payer: Self-pay

## 2018-02-12 NOTE — Telephone Encounter (Signed)
Patient called yesterday and wanted to know if it is ok for her to take Tylenol. Left voice mail this morning for patient that Dr. Irene Limbo stated it is ok for her to take Tylenol.

## 2018-02-13 ENCOUNTER — Inpatient Hospital Stay: Payer: Medicare Other

## 2018-02-13 DIAGNOSIS — Z5112 Encounter for antineoplastic immunotherapy: Secondary | ICD-10-CM | POA: Diagnosis not present

## 2018-02-13 DIAGNOSIS — E871 Hypo-osmolality and hyponatremia: Secondary | ICD-10-CM

## 2018-02-13 LAB — BASIC METABOLIC PANEL
Anion gap: 5 (ref 3–11)
BUN: 8 mg/dL (ref 7–26)
CALCIUM: 8.1 mg/dL — AB (ref 8.4–10.4)
CHLORIDE: 99 mmol/L (ref 98–109)
CO2: 25 mmol/L (ref 22–29)
CREATININE: 0.58 mg/dL — AB (ref 0.60–1.10)
GFR calc non Af Amer: >60 mL/min (ref >60)
GLUCOSE: 85 mg/dL (ref 70–140)
Potassium: 4.5 mmol/L (ref 3.5–5.1)
Sodium: 129 mmol/L — ABNORMAL LOW (ref 136–145)

## 2018-02-16 ENCOUNTER — Encounter: Payer: Self-pay | Admitting: Hematology

## 2018-02-16 ENCOUNTER — Inpatient Hospital Stay (HOSPITAL_BASED_OUTPATIENT_CLINIC_OR_DEPARTMENT_OTHER): Payer: Medicare Other | Admitting: Hematology

## 2018-02-16 ENCOUNTER — Inpatient Hospital Stay: Payer: Medicare Other

## 2018-02-16 VITALS — BP 142/87 | HR 98 | Temp 98.4°F | Resp 18 | Ht 63.0 in | Wt 132.9 lb

## 2018-02-16 DIAGNOSIS — Z5112 Encounter for antineoplastic immunotherapy: Secondary | ICD-10-CM | POA: Diagnosis not present

## 2018-02-16 DIAGNOSIS — C787 Secondary malignant neoplasm of liver and intrahepatic bile duct: Secondary | ICD-10-CM | POA: Diagnosis not present

## 2018-02-16 DIAGNOSIS — E871 Hypo-osmolality and hyponatremia: Secondary | ICD-10-CM

## 2018-02-16 DIAGNOSIS — J439 Emphysema, unspecified: Secondary | ICD-10-CM

## 2018-02-16 DIAGNOSIS — C7931 Secondary malignant neoplasm of brain: Secondary | ICD-10-CM

## 2018-02-16 DIAGNOSIS — K219 Gastro-esophageal reflux disease without esophagitis: Secondary | ICD-10-CM

## 2018-02-16 DIAGNOSIS — Z79899 Other long term (current) drug therapy: Secondary | ICD-10-CM

## 2018-02-16 DIAGNOSIS — R05 Cough: Secondary | ICD-10-CM

## 2018-02-16 DIAGNOSIS — C7889 Secondary malignant neoplasm of other digestive organs: Secondary | ICD-10-CM

## 2018-02-16 DIAGNOSIS — C7951 Secondary malignant neoplasm of bone: Secondary | ICD-10-CM | POA: Diagnosis not present

## 2018-02-16 DIAGNOSIS — E273 Drug-induced adrenocortical insufficiency: Secondary | ICD-10-CM

## 2018-02-16 DIAGNOSIS — M25552 Pain in left hip: Secondary | ICD-10-CM

## 2018-02-16 DIAGNOSIS — E43 Unspecified severe protein-calorie malnutrition: Secondary | ICD-10-CM | POA: Diagnosis not present

## 2018-02-16 DIAGNOSIS — M25551 Pain in right hip: Secondary | ICD-10-CM

## 2018-02-16 DIAGNOSIS — E2740 Unspecified adrenocortical insufficiency: Secondary | ICD-10-CM | POA: Diagnosis not present

## 2018-02-16 DIAGNOSIS — R748 Abnormal levels of other serum enzymes: Secondary | ICD-10-CM

## 2018-02-16 DIAGNOSIS — C7970 Secondary malignant neoplasm of unspecified adrenal gland: Secondary | ICD-10-CM | POA: Diagnosis not present

## 2018-02-16 DIAGNOSIS — K869 Disease of pancreas, unspecified: Secondary | ICD-10-CM

## 2018-02-16 DIAGNOSIS — X58XXXS Exposure to other specified factors, sequela: Secondary | ICD-10-CM

## 2018-02-16 DIAGNOSIS — K831 Obstruction of bile duct: Secondary | ICD-10-CM | POA: Diagnosis not present

## 2018-02-16 DIAGNOSIS — M8580 Other specified disorders of bone density and structure, unspecified site: Secondary | ICD-10-CM

## 2018-02-16 DIAGNOSIS — M1990 Unspecified osteoarthritis, unspecified site: Secondary | ICD-10-CM

## 2018-02-16 DIAGNOSIS — C3490 Malignant neoplasm of unspecified part of unspecified bronchus or lung: Secondary | ICD-10-CM

## 2018-02-16 DIAGNOSIS — M4807 Spinal stenosis, lumbosacral region: Secondary | ICD-10-CM

## 2018-02-16 DIAGNOSIS — S32028S Other fracture of second lumbar vertebra, sequela: Secondary | ICD-10-CM

## 2018-02-16 DIAGNOSIS — R5383 Other fatigue: Secondary | ICD-10-CM

## 2018-02-16 DIAGNOSIS — Z87891 Personal history of nicotine dependence: Secondary | ICD-10-CM

## 2018-02-16 LAB — BASIC METABOLIC PANEL
Anion gap: 9 (ref 3–11)
BUN: 10 mg/dL (ref 7–26)
CALCIUM: 8.6 mg/dL (ref 8.4–10.4)
CHLORIDE: 100 mmol/L (ref 98–109)
CO2: 23 mmol/L (ref 22–29)
CREATININE: 0.66 mg/dL (ref 0.60–1.10)
GFR calc non Af Amer: >60 mL/min (ref >60)
Glucose, Bld: 147 mg/dL — ABNORMAL HIGH (ref 70–140)
Potassium: 4.4 mmol/L (ref 3.5–5.1)
Sodium: 132 mmol/L — ABNORMAL LOW (ref 136–145)

## 2018-02-16 NOTE — Progress Notes (Signed)
HEMATOLOGY/ONCOLOGY CLINIC NOTE  Date of Service: 02/16/18  Patient Care Team: [facility], Modena Nunnery, MD as PCP - General (Family Medicine)  CHIEF COMPLAINTS/PURPOSE OF CONSULTATION:   F/u for continued management of extensive stage small cell lung cancer. hyponatremia  HISTORY OF PRESENTING ILLNESS:   Haley [name] is a wonderful 67 y.o. female who has been referred to [place] by Orthopedists Dr. Vickki Hearing for evaluation and management of Bone Marrow Abnormality. Her PCP is Dr. Buelah Manis.   She presents to the clinic today accompanied by her sons and her daughter-in-law. She notes she has several issues since October 2018.  She had colitis in 05/2017 with significant diarrhea and abdominal cramping --now mostly resolved.  She was treated with antibiotics. Afterward she had bronchitis. She was given albuterol and continues to take it as she has remaining cough (smoking contributed to this).   On 08/07/17 she notes she has back pain in her lower back the past couple of months. 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 Next day she saw her physician, who had done a xray and told her she had arthritis. She was treated with prednisone and gabapentin. After no back pain improvements on treatment, she has yet to return to work. She went to orthopedist and had a Lumbar MRI on 09/25/17. This shows a compression fracture of the L2 with abnormalities in her bone marrow.   She quit smoking 09/24/17. She had been smoking 1 pack a day for 50 years, since she was 67 yo. She still has cough from smoking but not as significant.   She denies heart conditions or disease, GI issues or diseases or other significant medical issues. DM runs in her family but no known cancers, blood disorders, or gene mutations. Prior to her health in the past 6 months she has lead a independent overall healthy life. Her last mammogram in 04/2017 was negative. Last colonoscopy in 2015 and last Papsmear in 2017,  were negative. She had a bone density scan in 2009. She has never been on HR therapy.   Given her significant pain (up to 10/10). She currently takes hydrocodone 1 tablet q4-5hours. This only reduced her pain to 7-8/10. She takes Tizanidine 21m q5-6hours. She has a back brace that she does not use.   On review of symptoms, pt notes ongoing b/l hip pain, lower back pain which can radiate up the side and down her legs and buttocks. The pain can keep her up at night. She notes when she lays on her right side, her throat pain which causes her to cough. This started around time of bronchitis and has not improved. When she does cough she produces clear phlegm.  Interval History:   Haley [name] here for a scheduled follow-up of her extensive stage metastatic small cell lung cancer. The patient's last visit with [place] on 02/09/18. She is accompanied today by her son. The pt reports that she is doing well overall.   The pt reports that she has had no new concerns. She has been taking her Hydrocortisone for her back pain and her sodium replacement as well.  She has had some mild rash underneath her eyes and on her extremeties. She adds that after eating she feels more bloated.   Lab results today (02/16/18) of BMP is as follows: all values are WNL except for Sodium at 132 (improved form 123) and Glucose at 147.  Cortisol 02/09/18 was low at 0.4 -on replacement  On review of systems, pt reports mild skin rashes, and denies headaches, changes in vision, confusion, and any other symptoms.    MEDICAL HISTORY:  Past Medical History:  Diagnosis Date  . Allergy   . Osteopenia     SURGICAL HISTORY: Past Surgical History:  Procedure Laterality Date  . COLONOSCOPY    . COLONOSCOPY WITH PROPOFOL N/A 10/25/2014   Procedure: COLONOSCOPY WITH PROPOFOL;  Surgeon: Garlan Fair, MD;  Location: [facility];  Service: Endoscopy;  Laterality: N/A;  . ERCP N/A 10/15/2017   Procedure: ENDOSCOPIC RETROGRADE  CHOLANGIOPANCREATOGRAPHY (ERCP);  Surgeon: Clarene Essex, MD;  Location: [facility];  Service: Endoscopy;  Laterality: N/A;  . IR FLUORO GUIDE PORT INSERTION RIGHT  11/10/2017  . IR RADIOLOGIST EVAL & MGMT  10/02/2017  . IR US GUIDE VASC ACCESS RIGHT  11/10/2017    SOCIAL HISTORY: Social History   Socioeconomic History  . Marital status: Single    Spouse name: Not on file  . Number of children: Not on file  . Years of education: Not on file  . Highest education level: Not on file  Occupational History  . Not on file  Social Needs  . Financial resource strain: Not on file  . Food insecurity:    Worry: Not on file    Inability: Not on file  . Transportation needs:    Medical: Not on file    Non-medical: Not on file  Tobacco Use  . Smoking status: Former Smoker    Packs/day: 1.00    Years: 40.00    Pack years: 40.00    Types: Cigarettes    Last attempt to quit: 09/24/2017    Years since quitting: 0.3  . Smokeless tobacco: Never Used  Substance and Sexual Activity  . Alcohol use: Yes    Comment: rare social  . Drug use: No  . Sexual activity: Not Currently  Lifestyle  . Physical activity:    Days per week: Not on file    Minutes per session: Not on file  . Stress: Not on file  Relationships  . Social connections:    Talks on phone: Not on file    Gets together: Not on file    Attends religious service: Not on file    Active member of club or organization: Not on file    Attends meetings of clubs or organizations: Not on file    Relationship status: Not on file  . Intimate partner violence:    Fear of current or ex partner: Not on file    Emotionally abused: Not on file    Physically abused: Not on file    Forced sexual activity: Not on file  Other Topics Concern  . Not on file  Social History Narrative  . Not on file    FAMILY HISTORY: Family History  Problem Relation Age of Onset  . Diabetes Mother   . Hypertension Mother   . Diabetes Sister   .  Hypertension Sister   . Hyperlipidemia Sister     ALLERGIES:  has No Known Allergies.  MEDICATIONS:  Current Outpatient Medications  Medication Sig Dispense Refill  . calcium carbonate (OS-CAL - DOSED IN MG OF ELEMENTAL CALCIUM) 1250 (500 Ca) MG tablet Take 1 tablet by mouth 2 (two) times daily with a meal.    . fexofenadine (ALLEGRA) 60 MG tablet Take 60 mg by mouth daily as needed for allergies or rhinitis.    . hydrocortisone (CORTEF) 10 MG tablet Take 1 tablet (10  mg total) by mouth daily with breakfast. And 24m po daily with lunch. 30 tablet 0  . lidocaine-prilocaine (EMLA) cream Apply to affected area once 30 g 3  . LORazepam (ATIVAN) 0.5 MG tablet Take 1 tablet (0.5 mg total) by mouth every 6 (six) hours as needed (Nausea or vomiting). 30 tablet 0  . magic mouthwash w/lidocaine SOLN Take 10 mLs by mouth 4 (four) times daily as needed for mouth pain. Rinse and spit or swallow 240 mL 2  . Multiple Vitamins-Minerals (MULTIVITAMIN WITH MINERALS) tablet Take 1 tablet by mouth daily.    . ondansetron (ZOFRAN) 4 MG tablet Take 1 tablet (4 mg total) by mouth every 8 (eight) hours as needed for nausea or vomiting. 20 tablet 0  . oxyCODONE (OXYCONTIN) 15 mg 12 hr tablet Take 1 tablet (15 mg total) by mouth every 12 (twelve) hours. 60 tablet 0  . polyethylene glycol (MIRALAX) packet Take 17 g by mouth daily as needed for moderate constipation. 30 each 0  . prochlorperazine (COMPAZINE) 10 MG tablet Take 1 tablet (10 mg total) by mouth every 6 (six) hours as needed (Nausea or vomiting). 30 tablet 1  . senna-docusate (SENNA S) 8.6-50 MG tablet Take 2 tablets by mouth at bedtime. 60 tablet 1  . sodium chloride 1 g tablet Take 1 tablet (1 g total) by mouth 3 (three) times daily with meals. 30 tablet 0   No current facility-administered medications for this visit.    Facility-Administered Medications Ordered in Other Visits  Medication Dose Route Frequency Provider Last Rate Last Dose  .  acetaminophen (TYLENOL) tablet 650 mg  650 mg Oral Once MCurt Bears MD      . diphenhydrAMINE (BENADRYL) capsule 25 mg  25 mg Oral Once MCurt Bears MD        REVIEW OF SYSTEMS:   A 10+ POINT REVIEW OF SYSTEMS WAS OBTAINED including neurology, dermatology, psychiatry, cardiac, respiratory, lymph, extremities, GI, GU, Musculoskeletal, constitutional, breasts, reproductive, HEENT.  All pertinent positives are noted in the HPI.  All others are negative.   PHYSICAL EXAMINATION: ECOG PERFORMANCE STATUS: 2 - Symptomatic, <50% confined to bed . Vitals:   02/16/18 1529  BP: (!) 142/87  Pulse: 98  Resp: 18  Temp: 98.4 F (36.9 C)  SpO2: 92%   Filed Weights   02/16/18 1529  Weight: 132 lb 14.4 oz (60.3 kg)   .Body mass index is 23.54 kg/m.  GENERAL:alert, in no acute distress and comfortable SKIN: no acute rashes, no significant lesions EYES: conjunctiva are pink and non-injected, sclera anicteric OROPHARYNX: MMM, no exudates, no oropharyngeal erythema or ulceration NECK: supple, no JVD LYMPH:  no palpable lymphadenopathy in the cervical, axillary or inguinal regions LUNGS: clear to auscultation b/l with normal respiratory effort HEART: regular rate & rhythm ABDOMEN:  normoactive bowel sounds , non tender, not distended. Extremity: no pedal edema PSYCH: alert & oriented x 3 with fluent speech NEURO: no focal motor/sensory deficits   LABORATORY DATA:   . CBC Latest Ref Rng & Units 02/09/2018 02/09/2018 01/20/2018  WBC 3.9 - 10.3 K/uL 7.8 6.3 15.2(H)  Hemoglobin 11.6 - 15.9 g/dL 10.6(L) 12.0 11.0(L)  Hematocrit 34.8 - 46.6 % 31.4(L) 35.9 34.1(L)  Platelets 145 - 400 K/uL 206 198 278  HGB 9.6 . CMP Latest Ref Rng & Units 02/16/2018 02/13/2018 02/09/2018  Glucose 70 - 140 mg/dL 147(H) 85 177(H)  BUN 7 - 26 mg/dL 10 8 9   Creatinine 0.60 - 1.10 mg/dL 0.66 0.58(L) 0.59(L)  Sodium  136 - 145 mmol/L 132(L) 129(L) 123(L)  Potassium 3.5 - 5.1 mmol/L 4.4 4.5 3.8  Chloride 98 -  109 mmol/L 100 99 94(L)  CO2 22 - 29 mmol/L 23 25 22   Calcium 8.4 - 10.4 mg/dL 8.6 8.1(L) 7.9(L)  Total Protein 6.4 - 8.3 g/dL - - 6.0(L)  Total Bilirubin 0.2 - 1.2 mg/dL - - 0.4  Alkaline Phos 40 - 150 U/L - - 144  AST 5 - 34 U/L - - 18  ALT 0 - 55 U/L - - 43       10/17/17 Liver needle/core biopsy   Fine Needle Aspiration, EUS, Pancreas3/12/2017 [facility] Result Narrative  ACCESSION NUMBER: [id number] RECEIVED: 10/24/2017 ORDERING PHYSICIAN: Harley Hallmark , MD PATIENT NAME: Haley [name]  Final Cytologic Interpretation  Pancreas, endoscopic ultrasound-guided, fine needle aspiration II (smears and cell block): 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  Specimen Adequacy:Satisfactory for evaluation.    COMMENT:The smears show clusters of neoplastic cells with neuroendocrine nuclear features and scant cytoplasm.  Immunoperoxidase studies on limited cell block material show the neoplastic cells stain positive for synaptophysin, CD56, cytokeratin AE1/AE3 (Golgi-dot like staining) and TTF-1. The MIB-1 shows a high proliferation index with positive staining in approximately 80% of the cells. Together with the cytomorphology this supports the diagnosis of small cell carcinoma. Given the presence of a suspected right infrahilar lung mass on MRI per [facility] and a pancreatic mass, I cannot be certain of the site of origin and whether this represents a primary pancreatic small cell poorly differentiated endocrine carcinoma versus a metastatic small cell carcinoma of lung origin (both can express TTF-1). Clinical correlation advised.  Dr. Romilda Garret has reviewed the case in consultation and agrees with the diagnosis of small cell carcinoma.   The positive controls worked appropriately   "These tests were developed and their performance characteristics determined by [facility],  [facility].They have not been cleared or approved by the U.S. Food and Drug Administration.The FDA has determined that such clearance or approval is not necessary.These tests are used for clinical purposes.They should not be regarded as investigational or for research.This laboratory is certified under the [place] (CLIA) as qualified to perform high complexity clinical laboratory testing."   [name] Prepared By:O.A. Sheryle Hail, M.D.  I have personally reviewed the slides and/or other related materials referenced, and have edited the [name] as part of my pathologic assessment and final interpretation.  Electronically Signed Out By: Blythe Stanford , MD 10/28/2017 20:55:03  sb/oah  Specimen(s) Received:  Pancreas, endoscopic ultrasound-guided, fine needle aspiration II (smears and cell block)  Clinical History obstructive jaundice  Preliminary (on-site) Interpretation  Adequate. L.[facility] CT(ASCP) 10/24/17     RADIOGRAPHIC STUDIES: I have personally reviewed the radiological images as listed and agreed with the findings in the [name]. No results found.  MRI Lumbar Spine 09/25/17  IMPRESSION:  1. Extensive marrow signal abnormality throughout the osseous structures of the visualized thoracolumbar spine, sacrum, and iliac bones concerning for metastases, multiple myeloma, or other nonspecific marrow infiltrative disorder. Suggest obtaining a PET/CT to evaluate extent of disease/evaluate for a primary tumor.  2. L2 compression fracture, concerning for pathologic fracture given the extensive marrow abnormality with approximately 40% vertebral body height loss and 8.43m of retropulsion resulting in moderate narrowing of the spinal canal. A postcontrast MRI with fat saturation would help to evaluate the enhancements pattern of the bony disease and evaluate for potential abnormal  soft issue extending beyond the cortex  which is not definitively evident in this exam.  3. Moderate facet arthrophy with grade 1 anterolisthesis at L4-5.  Degenerative disc disease with moderate left foraminal stenosis at L5-S1.     ASSESSMENT & PLAN:   Haley [name] is a 67 y.o. caucasian female with   1. 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  Liver mets + Bone mets + adrenal mets Pancreatic mets- FNA confirmed MRI brain 10/29/2017 after this clinic visit showed concern for multiple asymptomatic subcentimeter brain mets. 11/28/17 Brain MRI which revealed stable lesions, and new nonenhancing lesions. I discussed this imaging study with Dr Ulis Rias our neuro-oncologist -- no area of critical brain mets that needs patient to rush into WBRT at this time.  12/26/17 PET which revealed Marked improvement in the previously demonstrated widespread hypermetabolic metastatic disease. There is only mild residual hypermetabolic activity within some of the osseous lesions.   patient has completed 4 cycles of carboplatin/etoposide/Atezolizumab and completed -completed WBRT 30 Gy in 10 fractions 01/15/2018 - 01/29/2018  Plan: -WBRT completed with some mild fatigue and some minimal cognitive issues. -No thrush present -Continue staying well hydrated, eating well, and walking 20-30 minutes each day -Will repeat PET/CT and Brain MRI in August -Discussed pt labwork today, 02/16/18; BMP shows sodium increased to 132. Cortisol low at 0.4.  -Recommend taking OTC anti-histamines for mild rash -Begin taking Pepcid to help block rash -Use sunscreen as skin is more sensitized due to chemotherapy  -The pt has no prohibitive toxicities from continuing Atezolizumab q 3 weeks at this time.     2. 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 PLAN -recommended patient consume more salty foods -started on NACL tabs 1g po tid Will limit free water intake to 32 oz a day -dexamethasone swtiched to hydrocortisone 85m with breakfast and 576mwith lunch -rpt labs on 6/21 showed sodium levels improved from 123--> 129. -Pt may stop taking sodium replacement now, 02/16/18 BMP showed Sodium at 132  3.s/p obstructive Jaundice due to biliary compression from pancreatic mass  -ERCP attempted by Dr MaWatt Climes not successful -ERCP completed on 10/24/17 with Dr. RiHarley Hallmarkith much improved LFTs -Bx of  pancreatic lesion -consistent with small cell lung cancer.  5. Back pain related to pathologic L2 fracture (causing elevated alkaline phosphatase levels. -continue using back brace -continue prn Oxycodone with GI prophylaxis -will hold of on interventional procedure to L2 at this time. -Per patient's clinical presentation; her hip and leg pain is likely nerve pinching due to compression fractures.  -conitnue Oxycontin, and keeping short term Oxycodone prn. -Opiate related constipation: advised increasing Senna S to twice a day and using Miralax as well; one time OTC Magnesium Citrate if she still cannot move bowels.   5. Smoking history, productive  cough  Smoked for 50 years, 1 pack a day  Plan: -Smoking cessation being followed at this time.  6. Moderate to severe protein malnutrition due to weight loss -resolved . Wt Readings from Last 3 Encounters:  02/16/18 132 lb 14.4 oz (60.3 kg)  02/09/18 131 lb 4.8 oz (59.6 kg)  02/04/18 131 lb (59.4 kg)   7. Adrenal insufficiency -Continue current dose of  Hydrocoritso -Will refer pt to an endocrinologist     RTC as per appointment for MD/labs/[facility]eqcentric on 03/02/2018. Endocrinology referral for adrenal insufficiency mx     All of the patients questions were answered with apparent satisfaction. The patient knows to call the clinic with any problems, questions or concerns.   The toal time spent in the appt was 25 minutes and more than 50% was on counseling and direct patient cares.    Sullivan Lone MD MS AAHIVMS [facility] Hematology/Oncology Physician [facility]  (Office):       [phone] (Work cell):  [phone] (Fax):           [phone]  02/16/2018 4:04 PM  I, Baldwin [place], am acting as a Education administrator for Dr Irene Limbo.   .I have reviewed the above documentation for accuracy and completeness, and I agree with the above. Brunetta Genera MD

## 2018-02-17 ENCOUNTER — Telehealth: Payer: Self-pay

## 2018-02-17 NOTE — Telephone Encounter (Signed)
Spoke with patient concerning her upcoming appointments. Per 6//24 los did not include patient port. Patient prefer to have labs draw by port. She came in and picked up a copy of her schedule already. Mailed a copy as a verification of these appointment. Added referral for [facility] endocrinology

## 2018-02-23 ENCOUNTER — Inpatient Hospital Stay: Payer: Medicare Other | Attending: Hematology

## 2018-02-23 DIAGNOSIS — E278 Other specified disorders of adrenal gland: Secondary | ICD-10-CM | POA: Diagnosis not present

## 2018-02-23 DIAGNOSIS — C3490 Malignant neoplasm of unspecified part of unspecified bronchus or lung: Secondary | ICD-10-CM | POA: Insufficient documentation

## 2018-02-23 DIAGNOSIS — C7970 Secondary malignant neoplasm of unspecified adrenal gland: Secondary | ICD-10-CM | POA: Diagnosis not present

## 2018-02-23 DIAGNOSIS — Z7189 Other specified counseling: Secondary | ICD-10-CM

## 2018-02-23 DIAGNOSIS — Z79899 Other long term (current) drug therapy: Secondary | ICD-10-CM | POA: Diagnosis not present

## 2018-02-23 DIAGNOSIS — Z9221 Personal history of antineoplastic chemotherapy: Secondary | ICD-10-CM | POA: Diagnosis not present

## 2018-02-23 DIAGNOSIS — R112 Nausea with vomiting, unspecified: Secondary | ICD-10-CM | POA: Diagnosis present

## 2018-02-23 DIAGNOSIS — F419 Anxiety disorder, unspecified: Secondary | ICD-10-CM | POA: Insufficient documentation

## 2018-02-23 DIAGNOSIS — K5900 Constipation, unspecified: Secondary | ICD-10-CM | POA: Insufficient documentation

## 2018-02-23 DIAGNOSIS — K5903 Drug induced constipation: Secondary | ICD-10-CM | POA: Insufficient documentation

## 2018-02-23 DIAGNOSIS — R945 Abnormal results of liver function studies: Secondary | ICD-10-CM | POA: Insufficient documentation

## 2018-02-23 DIAGNOSIS — E2740 Unspecified adrenocortical insufficiency: Secondary | ICD-10-CM | POA: Diagnosis not present

## 2018-02-23 DIAGNOSIS — C7931 Secondary malignant neoplasm of brain: Secondary | ICD-10-CM | POA: Insufficient documentation

## 2018-02-23 DIAGNOSIS — E860 Dehydration: Secondary | ICD-10-CM | POA: Diagnosis not present

## 2018-02-23 DIAGNOSIS — C7951 Secondary malignant neoplasm of bone: Secondary | ICD-10-CM | POA: Diagnosis not present

## 2018-02-23 DIAGNOSIS — E871 Hypo-osmolality and hyponatremia: Secondary | ICD-10-CM | POA: Insufficient documentation

## 2018-02-23 DIAGNOSIS — C787 Secondary malignant neoplasm of liver and intrahepatic bile duct: Secondary | ICD-10-CM | POA: Insufficient documentation

## 2018-02-23 DIAGNOSIS — M4856XA Collapsed vertebra, not elsewhere classified, lumbar region, initial encounter for fracture: Secondary | ICD-10-CM | POA: Diagnosis not present

## 2018-02-23 DIAGNOSIS — Z87891 Personal history of nicotine dependence: Secondary | ICD-10-CM | POA: Diagnosis not present

## 2018-02-23 DIAGNOSIS — F1190 Opioid use, unspecified, uncomplicated: Secondary | ICD-10-CM | POA: Insufficient documentation

## 2018-02-23 MED ORDER — DENOSUMAB 120 MG/1.7ML ~~LOC~~ SOLN
120.0000 mg | Freq: Once | SUBCUTANEOUS | Status: AC
  Administered 2018-02-23: 120 mg via SUBCUTANEOUS

## 2018-02-23 MED ORDER — DENOSUMAB 120 MG/1.7ML ~~LOC~~ SOLN
SUBCUTANEOUS | Status: AC
Start: 2018-02-23 — End: ?
  Filled 2018-02-23: qty 1.7

## 2018-02-24 ENCOUNTER — Emergency Department (HOSPITAL_COMMUNITY): Payer: Medicare Other

## 2018-02-24 ENCOUNTER — Emergency Department (HOSPITAL_COMMUNITY)
Admission: EM | Admit: 2018-02-24 | Discharge: 2018-02-24 | Disposition: A | Discharge status: Home / Self Care | Payer: Medicare Other | Attending: Emergency Medicine | Admitting: Emergency Medicine

## 2018-02-24 ENCOUNTER — Encounter (HOSPITAL_COMMUNITY): Payer: Self-pay | Admitting: Emergency Medicine

## 2018-02-24 ENCOUNTER — Other Ambulatory Visit: Payer: Self-pay

## 2018-02-24 DIAGNOSIS — E860 Dehydration: Secondary | ICD-10-CM

## 2018-02-24 DIAGNOSIS — R51 Headache: Secondary | ICD-10-CM | POA: Diagnosis not present

## 2018-02-24 DIAGNOSIS — E785 Hyperlipidemia, unspecified: Secondary | ICD-10-CM | POA: Diagnosis not present

## 2018-02-24 DIAGNOSIS — C3490 Malignant neoplasm of unspecified part of unspecified bronchus or lung: Secondary | ICD-10-CM | POA: Diagnosis not present

## 2018-02-24 DIAGNOSIS — C799 Secondary malignant neoplasm of unspecified site: Secondary | ICD-10-CM

## 2018-02-24 DIAGNOSIS — C7951 Secondary malignant neoplasm of bone: Secondary | ICD-10-CM | POA: Diagnosis not present

## 2018-02-24 DIAGNOSIS — R5383 Other fatigue: Secondary | ICD-10-CM | POA: Diagnosis present

## 2018-02-24 DIAGNOSIS — Z79899 Other long term (current) drug therapy: Secondary | ICD-10-CM | POA: Diagnosis not present

## 2018-02-24 DIAGNOSIS — Z87891 Personal history of nicotine dependence: Secondary | ICD-10-CM | POA: Insufficient documentation

## 2018-02-24 LAB — CBC WITH DIFFERENTIAL/PLATELET
BASOS ABS: 0 10*3/uL (ref 0.0–0.1)
BASOS PCT: 0 %
EOS ABS: 0 10*3/uL (ref 0.0–0.7)
EOS PCT: 0 %
HCT: 36 % (ref 36.0–46.0)
Hemoglobin: 12.3 g/dL (ref 12.0–15.0)
Lymphocytes Relative: 11 %
Lymphs Abs: 0.6 10*3/uL — ABNORMAL LOW (ref 0.7–4.0)
MCH: 31.5 pg (ref 26.0–34.0)
MCHC: 34.2 g/dL (ref 30.0–36.0)
MCV: 92.1 fL (ref 78.0–100.0)
MONO ABS: 0.7 10*3/uL (ref 0.1–1.0)
Monocytes Relative: 12 %
Neutro Abs: 4.3 10*3/uL (ref 1.7–7.7)
Neutrophils Relative %: 77 %
PLATELETS: 278 10*3/uL (ref 150–400)
RBC: 3.91 MIL/uL (ref 3.87–5.11)
RDW: 15.2 % (ref 11.5–15.5)
WBC: 5.6 10*3/uL (ref 4.0–10.5)

## 2018-02-24 LAB — BASIC METABOLIC PANEL
Anion gap: 11 (ref 5–15)
BUN: 9 mg/dL (ref 8–23)
CALCIUM: 8.7 mg/dL — AB (ref 8.9–10.3)
CO2: 21 mmol/L — ABNORMAL LOW (ref 22–32)
CREATININE: 0.44 mg/dL (ref 0.44–1.00)
Chloride: 95 mmol/L — ABNORMAL LOW (ref 98–111)
GFR calc Af Amer: >60 mL/min (ref >60)
GLUCOSE: 142 mg/dL — AB (ref 70–99)
POTASSIUM: 3.9 mmol/L (ref 3.5–5.1)
SODIUM: 127 mmol/L — AB (ref 135–145)

## 2018-02-24 LAB — HEPATIC FUNCTION PANEL
ALK PHOS: 309 U/L — AB (ref 38–126)
ALT: 307 U/L — AB (ref 0–44)
AST: 538 U/L — ABNORMAL HIGH (ref 15–41)
Albumin: 3.6 g/dL (ref 3.5–5.0)
BILIRUBIN DIRECT: 1.6 mg/dL — AB (ref 0.0–0.2)
Indirect Bilirubin: 0.9 mg/dL (ref 0.3–0.9)
Total Bilirubin: 2.5 mg/dL — ABNORMAL HIGH (ref 0.3–1.2)
Total Protein: 6.7 g/dL (ref 6.5–8.1)

## 2018-02-24 LAB — URINALYSIS, ROUTINE W REFLEX MICROSCOPIC
BILIRUBIN URINE: NEGATIVE
GLUCOSE, UA: 50 mg/dL — AB
Ketones, ur: 5 mg/dL — AB
LEUKOCYTES UA: NEGATIVE
NITRITE: NEGATIVE
Protein, ur: NEGATIVE mg/dL
SPECIFIC GRAVITY, URINE: 1.017 (ref 1.005–1.030)
pH: 6 (ref 5.0–8.0)

## 2018-02-24 LAB — LIPASE, BLOOD: LIPASE: 24 U/L (ref 11–51)

## 2018-02-24 MED ORDER — SODIUM CHLORIDE 0.9 % IV BOLUS
1000.0000 mL | Freq: Once | INTRAVENOUS | Status: AC
  Administered 2018-02-24: 1000 mL via INTRAVENOUS

## 2018-02-24 MED ORDER — IPRATROPIUM-ALBUTEROL 0.5-2.5 (3) MG/3ML IN SOLN
3.0000 mL | Freq: Once | RESPIRATORY_TRACT | Status: AC
  Administered 2018-02-24: 3 mL via RESPIRATORY_TRACT
  Filled 2018-02-24: qty 3

## 2018-02-24 NOTE — ED Triage Notes (Addendum)
Patient here from home with complaints of headache on right side, weakness, fatigue. Oncology patient. States that it may be the new steroid that she started on.

## 2018-02-24 NOTE — Discharge Instructions (Signed)
Call Dr. Irene Limbo tomorrow for further work-up.

## 2018-02-24 NOTE — ED Provider Notes (Signed)
[facility] DEPT Provider Note   CSN: [id number] Arrival date & time: 02/24/18  1849     History   Chief Complaint Chief Complaint  Patient presents with  . Fatigue  . Headache  . Weakness    HPI Haley [name] is a 67 y.o. female.  HPI Patient presents with fatigue headache weakness.  Decreased appetite.  Has a history of metastatic lung cancer.  Has recently completed whole brain radiation.  Also previous intra-abdominal metastatic disease and bony metastatic disease.  States she is just been feeling worse.  Decreased oral intake.  Has had dehydration in the past.  No real cough.  Has some chronic abdominal pain that is unchanged.  Does have right-sided headache.  States recently had her steroids increased to help deal with the radiation and to help protect her bones. Past Medical History:  Diagnosis Date  . Allergy   . Osteopenia     Patient Active Problem List   Diagnosis Date Noted  . Brain metastasis ([place]) 01/08/2018  . HCAP (healthcare-associated pneumonia) 12/19/2017  . 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 Bone metastasis ([place]) 10/22/2017  . Counseling regarding advanced care planning and goals of care 10/22/2017  . Liver metastases ([place])   . 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 Metastatic disease ([place])   . Jaundice 10/13/2017  . Mood disorder ([place]) 05/12/2017  . Osteoarthritis 05/12/2017  . Osteopenia 05/06/2016  . Eustachian tube dysfunction 07/03/2015  . Allergic rhinitis 07/03/2015  . Tobacco use disorder 07/03/2015  . Hyperlipidemia 07/03/2015    Past Surgical History:  Procedure Laterality Date  . COLONOSCOPY    . COLONOSCOPY WITH PROPOFOL N/A 10/25/2014   Procedure: COLONOSCOPY WITH PROPOFOL;  Surgeon: Garlan Fair, MD;  Location: [facility] ENDOSCOPY;  Service: Endoscopy;  Laterality: N/A;  . ERCP N/A 10/15/2017   Procedure: ENDOSCOPIC RETROGRADE CHOLANGIOPANCREATOGRAPHY (ERCP);   Surgeon: Clarene Essex, MD;  Location: [facility];  Service: Endoscopy;  Laterality: N/A;  . IR FLUORO GUIDE PORT INSERTION RIGHT  11/10/2017  . IR RADIOLOGIST EVAL & MGMT  10/02/2017  . IR US GUIDE VASC ACCESS RIGHT  11/10/2017     OB History   None      Home Medications    Prior to Admission medications   Medication Sig Start Date End Date Taking? Authorizing Provider  calcium carbonate (OS-CAL - DOSED IN MG OF ELEMENTAL CALCIUM) 1250 (500 Ca) MG tablet Take 1 tablet by mouth daily.    Yes [provider]  fexofenadine (ALLEGRA) 60 MG tablet Take 60 mg by mouth daily as needed for allergies or rhinitis.   Yes [provider]  hydrocortisone (CORTEF) 10 MG tablet Take 1 tablet (10 mg total) by mouth daily with breakfast. And 5mg  po daily with lunch. 02/09/18  Yes Brunetta Genera, MD  lidocaine-prilocaine (EMLA) cream Apply to affected area once 10/22/17  Yes Brunetta Genera, MD  LORazepam (ATIVAN) 0.5 MG tablet Take 1 tablet (0.5 mg total) by mouth every 6 (six) hours as needed (Nausea or vomiting). 10/22/17  Yes Brunetta Genera, MD  magic mouthwash w/lidocaine SOLN Take 10 mLs by mouth 4 (four) times daily as needed for mouth pain. Rinse and spit or swallow 12/08/17  Yes Tanner, Lyndon Code., PA-C  Multiple Vitamins-Minerals (MULTIVITAMIN WITH MINERALS) tablet Take 1 tablet by mouth daily.   Yes [provider]  ondansetron (ZOFRAN) 4 MG tablet Take 1 tablet (4 mg total) by mouth  every 8 (eight) hours as needed for nausea or vomiting. 10/18/17  Yes Bonnielee Haff, MD  oxyCODONE (OXYCONTIN) 15 mg 12 hr tablet Take 1 tablet (15 mg total) by mouth every 12 (twelve) hours. 01/20/18  Yes Brunetta Genera, MD  polyethylene glycol [facility]) packet Take 17 g by mouth daily as needed for moderate constipation. 10/18/17  Yes Bonnielee Haff, MD  prochlorperazine (COMPAZINE) 10 MG tablet Take 1 tablet (10 mg total) by mouth every 6 (six) hours as needed (Nausea or  vomiting). 10/22/17  Yes Brunetta Genera, MD  senna-docusate (SENNA S) 8.6-50 MG tablet Take 2 tablets by mouth at bedtime. 09/30/17  Yes Brunetta Genera, MD  sodium chloride 1 g tablet Take 1 tablet (1 g total) by mouth 3 (three) times daily with meals. Patient not taking: Reported on 02/24/2018 02/09/18   Brunetta Genera, MD    Family History Family History  Problem Relation Age of Onset  . Diabetes Mother   . Hypertension Mother   . Diabetes Sister   . Hypertension Sister   . Hyperlipidemia Sister     Social History Social History   Tobacco Use  . Smoking status: Former Smoker    Packs/day: 1.00    Years: 40.00    Pack years: 40.00    Types: Cigarettes    Last attempt to quit: 09/24/2017    Years since quitting: 0.4  . Smokeless tobacco: Never Used  Substance Use Topics  . Alcohol use: Yes    Comment: rare social  . Drug use: No     Allergies   Patient has no known allergies.   Review of Systems Review of Systems  Constitutional: Positive for appetite change and fatigue. Negative for fever.  HENT: Negative for congestion.   Respiratory: Positive for shortness of breath. Negative for cough.   Cardiovascular: Negative for chest pain.  Gastrointestinal: Positive for abdominal pain.  Genitourinary: Negative for flank pain.  Musculoskeletal: Positive for back pain.  Skin: Negative for rash.  Neurological: Positive for headaches.  Hematological: Negative for adenopathy.  Psychiatric/Behavioral: Positive for confusion.     Physical Exam Updated Vital Signs BP (!) 129/96 (BP Location: Left Arm)   Pulse 98   Temp 97.9 F (36.6 C) (Oral)   Resp (!) 22   SpO2 95%   Physical Exam  Constitutional: She is oriented to person, place, and time. She appears well-developed.  HENT:  Head: Normocephalic.  Eyes: EOM are normal.  Neck: Normal range of motion.  Cardiovascular:  Mild tachycardia  Pulmonary/Chest:  Somewhat diffuse wheezes and harsh breath  sounds.  Abdominal: There is tenderness.  Mild diffuse tenderness, unchanged per patient.  Neurological: She is alert and oriented to person, place, and time.  Skin: Skin is warm. Capillary refill takes less than 2 seconds.  Psychiatric: She has a normal mood and affect.     ED Treatments / Results  Labs (all labs ordered are listed, but only abnormal results are displayed) Labs Reviewed  BASIC METABOLIC PANEL - Abnormal; Notable for the following components:      Result Value   Sodium 127 (*)    Chloride 95 (*)    CO2 21 (*)    Glucose, Bld 142 (*)    Calcium 8.7 (*)    All other components within normal limits  URINALYSIS, ROUTINE W REFLEX MICROSCOPIC - Abnormal; Notable for the following components:   Color, Urine AMBER (*)    Glucose, UA 50 (*)    Hgb  urine dipstick SMALL (*)    Ketones, ur 5 (*)    Bacteria, UA RARE (*)    All other components within normal limits  HEPATIC FUNCTION PANEL - Abnormal; Notable for the following components:   AST 538 (*)    ALT 307 (*)    Alkaline Phosphatase 309 (*)    Total Bilirubin 2.5 (*)    Bilirubin, Direct 1.6 (*)    All other components within normal limits  CBC WITH DIFFERENTIAL/PLATELET - Abnormal; Notable for the following components:   Lymphs Abs 0.6 (*)    All other components within normal limits  LIPASE, BLOOD  CBG MONITORING, ED    EKG None  Radiology Ct Head Wo Contrast  Result Date: 02/24/2018 CLINICAL DATA:  67 year old female with acute headache and vomiting. Known metastatic lung cancer. EXAM: CT HEAD WITHOUT CONTRAST TECHNIQUE: Contiguous axial images were obtained from the base of the skull through the vertex without intravenous contrast. COMPARISON:  01/12/2018 MR and prior studies FINDINGS: Brain: Small cerebral metastases identified on recent MR are difficult to visualize on CT today. No discrete mass or mass effect, infarct, hemorrhage, midline shift, extra-axial collection or hydrocephalus noted. Mild  chronic small-vessel white matter ischemic changes noted. Vascular: Mild intracranial atherosclerotic calcifications noted. Skull: Normal. Negative for fracture or focal lesion. Sinuses/Orbits: Fluid within the LEFT sphenoid sinus again noted. Other: None. IMPRESSION: 1. No evidence of acute intracranial abnormality. Small cerebral metastases identified on 01/12/2018 MR are not well visualized by CT. 2. Mild chronic small-vessel white matter ischemic changes. Electronically Signed   By: Margarette Canada M.D.   On: 02/24/2018 21:28   Dg Chest Portable 1 View  Result Date: 02/24/2018 CLINICAL DATA:  Acute shortness of breath and fatigue. EXAM: PORTABLE CHEST 1 VIEW COMPARISON:  12/26/2017 PET CT, 12/19/2017 chest radiograph and priors FINDINGS: The cardiomediastinal silhouette is unremarkable. A RIGHT Port-A-Cath is again noted with tip overlying the mid SVC. RIGHT basilar atelectasis/scarring again noted. There is no evidence of focal airspace disease, pulmonary edema, suspicious pulmonary nodule/mass, pleural effusion, or pneumothorax. No acute bony abnormalities are identified. IMPRESSION: No evidence of acute cardiopulmonary disease. Electronically Signed   By: Margarette Canada M.D.   On: 02/24/2018 19:57    Procedures Procedures (including critical care time)  Medications Ordered in ED Medications  sodium chloride 0.9 % bolus 1,000 mL (0 mLs Intravenous Stopped 02/24/18 2220)  ipratropium-albuterol (DUONEB) 0.5-2.5 (3) MG/3ML nebulizer solution 3 mL (3 mLs Nebulization Given 02/24/18 2029)  sodium chloride 0.9 % bolus 1,000 mL (1,000 mLs Intravenous New Bag/Given 02/24/18 2223)     Initial Impression / Assessment and Plan / ED Course  I have reviewed the triage vital signs and the nursing notes.  Pertinent labs & imaging results that were available during my care of the patient were reviewed by me and considered in my medical decision making (see chart for details).     Patient with fatigue weakness  headache and decreased oral intake.  Feels much better after fluid.  Good kidney function but now has bumped her LFTs again.  Has some chronic abdominal pain that is really unchanged but does have a biliary stent.  Discussed with Dr. Alvy Bimler.  Patient will follow-up with Dr. Irene Limbo.  He will give a call tomorrow.  No fevers.  Could be toxicity from her medications or also potentially occlusion of her stent.  Also potential worsening of her metastatic disease.  Final Clinical Impressions(s) / ED Diagnoses   Final diagnoses:  Dehydration  Metastatic cancer [facility])    ED Discharge Orders    None       Davonna Belling, MD 02/24/18 2342

## 2018-02-25 ENCOUNTER — Emergency Department (HOSPITAL_COMMUNITY): Payer: Medicare Other

## 2018-02-25 ENCOUNTER — Telehealth: Payer: Self-pay

## 2018-02-25 ENCOUNTER — Emergency Department (HOSPITAL_COMMUNITY)
Admission: EM | Admit: 2018-02-25 | Discharge: 2018-02-25 | Disposition: A | Discharge status: Home / Self Care | Payer: Medicare Other | Attending: Emergency Medicine | Admitting: Emergency Medicine

## 2018-02-25 ENCOUNTER — Other Ambulatory Visit: Payer: Self-pay

## 2018-02-25 ENCOUNTER — Encounter (HOSPITAL_COMMUNITY): Payer: Self-pay

## 2018-02-25 DIAGNOSIS — C3490 Malignant neoplasm of unspecified part of unspecified bronchus or lung: Secondary | ICD-10-CM | POA: Diagnosis not present

## 2018-02-25 DIAGNOSIS — Z87891 Personal history of nicotine dependence: Secondary | ICD-10-CM | POA: Insufficient documentation

## 2018-02-25 DIAGNOSIS — C7951 Secondary malignant neoplasm of bone: Secondary | ICD-10-CM | POA: Diagnosis not present

## 2018-02-25 DIAGNOSIS — Z79899 Other long term (current) drug therapy: Secondary | ICD-10-CM | POA: Insufficient documentation

## 2018-02-25 DIAGNOSIS — R1084 Generalized abdominal pain: Secondary | ICD-10-CM | POA: Diagnosis present

## 2018-02-25 DIAGNOSIS — R7989 Other specified abnormal findings of blood chemistry: Secondary | ICD-10-CM | POA: Insufficient documentation

## 2018-02-25 DIAGNOSIS — C787 Secondary malignant neoplasm of liver and intrahepatic bile duct: Secondary | ICD-10-CM | POA: Insufficient documentation

## 2018-02-25 DIAGNOSIS — R945 Abnormal results of liver function studies: Secondary | ICD-10-CM

## 2018-02-25 HISTORY — DX: Dorsalgia, unspecified: M54.9

## 2018-02-25 LAB — CBC
HCT: 32.7 % — ABNORMAL LOW (ref 36.0–46.0)
Hemoglobin: 11.2 g/dL — ABNORMAL LOW (ref 12.0–15.0)
MCH: 32.1 pg (ref 26.0–34.0)
MCHC: 34.3 g/dL (ref 30.0–36.0)
MCV: 93.7 fL (ref 78.0–100.0)
Platelets: 252 10*3/uL (ref 150–400)
RBC: 3.49 MIL/uL — AB (ref 3.87–5.11)
RDW: 15.3 % (ref 11.5–15.5)
WBC: 3.9 10*3/uL — AB (ref 4.0–10.5)

## 2018-02-25 LAB — HEPATIC FUNCTION PANEL
ALBUMIN: 3.4 g/dL — AB (ref 3.5–5.0)
ALK PHOS: 297 U/L — AB (ref 38–126)
ALT: 240 U/L — ABNORMAL HIGH (ref 0–44)
AST: 229 U/L — ABNORMAL HIGH (ref 15–41)
Bilirubin, Direct: 2.2 mg/dL — ABNORMAL HIGH (ref 0.0–0.2)
Indirect Bilirubin: 1 mg/dL — ABNORMAL HIGH (ref 0.3–0.9)
Total Bilirubin: 3.2 mg/dL — ABNORMAL HIGH (ref 0.3–1.2)
Total Protein: 6.5 g/dL (ref 6.5–8.1)

## 2018-02-25 LAB — BASIC METABOLIC PANEL
Anion gap: 8 (ref 5–15)
BUN: 5 mg/dL — ABNORMAL LOW (ref 8–23)
CALCIUM: 8.4 mg/dL — AB (ref 8.9–10.3)
CHLORIDE: 102 mmol/L (ref 98–111)
CO2: 22 mmol/L (ref 22–32)
CREATININE: 0.35 mg/dL — AB (ref 0.44–1.00)
GFR calc non Af Amer: >60 mL/min (ref >60)
Glucose, Bld: 113 mg/dL — ABNORMAL HIGH (ref 70–99)
Potassium: 3.6 mmol/L (ref 3.5–5.1)
SODIUM: 132 mmol/L — AB (ref 135–145)

## 2018-02-25 MED ORDER — HEPARIN SOD (PORK) LOCK FLUSH 100 UNIT/ML IV SOLN
500.0000 [IU] | Freq: Once | INTRAVENOUS | Status: AC
  Administered 2018-02-25: 500 [IU]
  Filled 2018-02-25: qty 5

## 2018-02-25 MED ORDER — GADOBENATE DIMEGLUMINE 529 MG/ML IV SOLN
12.0000 mL | Freq: Once | INTRAVENOUS | Status: AC | PRN
  Administered 2018-02-25: 12 mL via INTRAVENOUS

## 2018-02-25 NOTE — ED Triage Notes (Signed)
Patient reports that she was in the ED yesterday. Patient was discharged. Patient was called by the referred doctor and was told to come back to the ED for admission. Patient states she had x-rays, CT head, etc.

## 2018-02-25 NOTE — ED Notes (Signed)
ED Provider at bedside. 

## 2018-02-25 NOTE — Telephone Encounter (Signed)
Patient was seen in the ED last night 02/24/18 by Dr. Alvino Chapel, assessed and discharged. Dr. Alvino Chapel sent a message to Dr. Irene Limbo stating that the patient will be contacting the office for follow-up today 02/25/18. Dr. Irene Limbo sent a message to Dr. Alvino Chapel stating that he would have admitted the patient. Per Dr. Irene Limbo contacted the hospitalist for direct admission today for abdominal imaging, GI consultation, and to address elevated LFT's. Dr. Leda Gauze call hospitalist returned call to Dr. Irene Limbo and per Dr. Estrella Deeds recommendation patient should report to the ED for evaluation and admission and not be directly admitted through the hospitalist service. Called patient and made her aware that she will not be directly admitted and needs to report to the ED at [facility]. Patient verbalized understanding to report to the ED.

## 2018-02-25 NOTE — ED Notes (Signed)
Patient reports last PO intake 14:15.

## 2018-02-25 NOTE — ED Provider Notes (Signed)
Care assumed from [facility], PA-C at shift change with MRCP pending.   In brief, this patient is a 67 y.o. F with PMH/o SCC with pancreatic mass who presents for evaluation of reevaluation.  She had been seen yesterday in the ED for fatigue.  At that time, she had a bump in her LFTs and bilirubin.  She was discharged home after getting fluids in the ED and stating that she felt better.  Patient was told to come back into the emergency department today by her oncologist for evaluation.  Oncology wanted evaluation by GI and MRCP.  Oncology attempted to direct admit but was unable to complete prompting ED visit visit.  Patient reports she had been feeling better. Please see note from previous provider for full history/physical exam.  PLAN: LFTs improved since yesterday.  She does have a mild bump in her total bili.  Devious provider attempted to admit but was declined stating that she did not MRCP first because if she needed intervention that was not offered here, she would need to go to [facility] since that is where her previous ERCP and work-up had been done.  Plan if MRCP shows obstruction, she will likely need admission to [facility] for further evaluation.  If no finding on MRCP, discussed with GI and oncology for further evaluation.  MDM:  MRCP shows significant interval improvement since the prior MRCP.  Stable intra-and extrahepatic biliary dilatation.  Common bile duct stent appears to be in good position without complicating features.  Mild distention of the gallbladder with some layering sludge.  Discussed results with patient.  She reports improvement in symptoms. Currently denies any abdominal pain at this time. On my exam, patient exhibits no abdominal tenderness, particularly to the RUQ. She has tolerated PO in the department.  Will consult GI for any further recommendation.   Discussed patient with Dr. Penelope Coop (GI).  Since MRCP shows no evidence of blockage, does not feel that there is any  further GI work-up needed.  Feels that the increase in LFTs may be related to metastatic disease.  No further intervention required.  Will consult oncology for further evaluation.  Discussed patient with Dr. Burr Medico (Oncology) regarding work-up.  I discussed at length with her regarding patient's work-up here in the ED and MRCP findings.  Additionally, we discussed my conversation with Dr. Penelope Coop (GI) and the opinion that there is no more further GI intervention needed.  Patient does have mention of gallbladder sludge on her MRCP.  She is currently afebrile with no elevation of white blood cell count.  Additionally, on my exam, she had no tenderness.  Given that GI feels like this requires no further intervention, work-up is reassuring and patient had MRCP done which is what oncology was concerned about, Dr. Annamaria Boots feels like patient can easily be be discharged home with plans to follow-up with Dr. Irene Limbo.    Given reassuring findings on MRCP, conversation with both GI and oncology, feel that patient can be recently discharged home.  Previous provider had attempted to admit based on initial concerns that she needed GI evaluation and MRCP but patient was declined.  She received a GI consult and MRCP here he in the ED and there is nothing more to do from a GI perspective.  Patient is not in any pain now.  Discussed plan with patient.  Denying any abdominal pain at this time.  Instructed patient that she is to follow-up with Dr. Irene Limbo as directed.  Patient voiced concerns about going  home and ending up her right back here.  She states that she gets to the point where she cannot eat and gets dehydrated.  She is eaten Fritos, CABG and soda here in the ED.  She is currently denying any abdominal pain.  No tenderness noted on my exam.  I offered to consult hospitalist to see if we could get her admitted.  Explained that that cannot guarantee an admission but I would be willing to call them and ask.  Patient stated that she  did not want that and one was ready to go home.  I again offered to call hospitalist to see if I could get her admitted but patient states that she did not want that to be done.  Patient stable for discharge at this time. Patient had ample opportunity for questions and discussion. All patient's questions were answered with full understanding. Strict return precautions discussed. Patient expresses understanding and agreement to plan.    1. Elevated liver function tests   2. Generalized abdominal pain       Desma Mcgregor 02/25/18 2350    Drenda Freeze, MD 02/27/18 [phone]

## 2018-02-25 NOTE — Discharge Instructions (Addendum)
As we discussed, your imaging today did not show any blockage.  You will need to have your liver functions tested in the next few days.  Call Dr. Grier Mitts office to arrange for an appointment.  Return to the emergency department for any abdominal pain, fever, nausea/vomiting, chest pain, difficulty breathing or any other worsening or concerning symptoms.

## 2018-02-25 NOTE — ED Provider Notes (Signed)
[facility] DEPT Provider Note   CSN: [id number] Arrival date & time: 02/25/18  1237     History   Chief Complaint Chief Complaint  Patient presents with  . Abdominal Pain    HPI Haley [name] is a 67 y.o. female.  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  She was encouraged to come to the emergency department today at the urging of her oncologist, Dr. Irene Limbo.  Patient was seen in the emergency department yesterday for dehydration and fatigue.  She was given fluids which have helped her feel much better.  EDP spoke with on-call oncologist and decision was made for discharge.  LFT elevation was noted at that time.  She still complains of decreased appetite and pain with eating.  She denies any fevers, chest pain, shortness of breath.  No diarrhea.  No lower extremity swelling.  Onset of symptoms acute.  Course is waxing and waning.  Nothing makes symptoms better.  Patient was diagnosed in 09/2017.  She had an MRCP performed here showing pancreatic head mass.  ERCP performed at that time by Dr. Watt Climes with pancreatic stent placed however was unsuccessful for tissue biopsy.  Patient was sent to [facility] as outpatient for ERCP.  She has recently been on steroids.     Past Medical History:  Diagnosis Date  . Allergy   . Back pain   . Cancer ([place])   . Osteopenia     Patient Active Problem List   Diagnosis Date Noted  . Brain metastasis ([place]) 01/08/2018  . HCAP (healthcare-associated pneumonia) 12/19/2017  . Extensive stage primary small cell carcinoma of lung ([place]) 10/22/2017  . Bone metastasis ([place]) 10/22/2017  . Counseling regarding advanced care planning and goals of care 10/22/2017  . Liver metastases ([place])   . 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 Metastatic disease ([place])   . Jaundice 10/13/2017  . Mood disorder ([place])  05/12/2017  . Osteoarthritis 05/12/2017  . Osteopenia 05/06/2016  . Eustachian tube dysfunction 07/03/2015  . Allergic rhinitis 07/03/2015  . Tobacco use disorder 07/03/2015  . Hyperlipidemia 07/03/2015    Past Surgical History:  Procedure Laterality Date  . COLONOSCOPY    . COLONOSCOPY WITH PROPOFOL N/A 10/25/2014   Procedure: COLONOSCOPY WITH PROPOFOL;  Surgeon: Garlan Fair, MD;  Location: [facility]COPY;  Service: Endoscopy;  Laterality: N/A;  . ERCP N/A 10/15/2017   Procedure: ENDOSCOPIC RETROGRADE CHOLANGIOPANCREATOGRAPHY (ERCP);  Surgeon: Clarene Essex, MD;  Location: [facility];  Service: Endoscopy;  Laterality: N/A;  . IR FLUORO GUIDE PORT INSERTION RIGHT  11/10/2017  . IR RADIOLOGIST EVAL & MGMT  10/02/2017  . IR US GUIDE VASC ACCESS RIGHT  11/10/2017     OB History   None      Home Medications    Prior to Admission medications   Medication Sig Start Date End Date Taking? Authorizing Provider  calcium carbonate (OS-CAL - DOSED IN MG OF ELEMENTAL CALCIUM) 1250 (500 Ca) MG tablet Take 1 tablet by mouth daily.    Yes [provider]  hydrocortisone (CORTEF) 10 MG tablet Take 1 tablet (10 mg total) by mouth daily with breakfast. And 5mg  po daily with lunch. 02/09/18  Yes Brunetta Genera, MD  lidocaine-prilocaine (EMLA) cream Apply to affected area once 10/22/17  Yes Brunetta Genera, MD  LORazepam (ATIVAN) 0.5 MG tablet Take 1 tablet (0.5 mg total) by  mouth every 6 (six) hours as needed (Nausea or vomiting). 10/22/17  Yes Brunetta Genera, MD  Multiple Vitamins-Minerals (MULTIVITAMIN WITH MINERALS) tablet Take 1 tablet by mouth daily.   Yes [provider]  oxyCODONE (OXYCONTIN) 15 mg 12 hr tablet Take 1 tablet (15 mg total) by mouth every 12 (twelve) hours. 01/20/18  Yes Brunetta Genera, MD  magic mouthwash w/lidocaine SOLN Take 10 mLs by mouth 4 (four) times daily as needed for mouth pain. Rinse and spit or swallow Patient not taking: Reported  on 02/25/2018 12/08/17   Sandi Mealy E., PA-C  ondansetron (ZOFRAN) 4 MG tablet Take 1 tablet (4 mg total) by mouth every 8 (eight) hours as needed for nausea or vomiting. 10/18/17   Bonnielee Haff, MD  polyethylene glycol [facility]) packet Take 17 g by mouth daily as needed for moderate constipation. Patient not taking: Reported on 02/25/2018 10/18/17   Bonnielee Haff, MD  prochlorperazine (COMPAZINE) 10 MG tablet Take 1 tablet (10 mg total) by mouth every 6 (six) hours as needed (Nausea or vomiting). 10/22/17   Brunetta Genera, MD  senna-docusate (SENNA S) 8.6-50 MG tablet Take 2 tablets by mouth at bedtime. 09/30/17   Brunetta Genera, MD  sodium chloride 1 g tablet Take 1 tablet (1 g total) by mouth 3 (three) times daily with meals. Patient not taking: Reported on 02/24/2018 02/09/18   Brunetta Genera, MD    Family History Family History  Problem Relation Age of Onset  . Diabetes Mother   . Hypertension Mother   . Diabetes Sister   . Hypertension Sister   . Hyperlipidemia Sister     Social History Social History   Tobacco Use  . Smoking status: Former Smoker    Packs/day: 1.00    Years: 40.00    Pack years: 40.00    Types: Cigarettes    Last attempt to quit: 09/24/2017    Years since quitting: 0.4  . Smokeless tobacco: Never Used  Substance Use Topics  . Alcohol use: Yes    Comment: rare social  . Drug use: No     Allergies   Patient has no known allergies.   Review of Systems Review of Systems  Constitutional: Positive for appetite change and fatigue. Negative for fever.  HENT: Negative for rhinorrhea and sore throat.   Eyes: Negative for redness.  Respiratory: Negative for cough and shortness of breath.   Cardiovascular: Negative for chest pain.  Gastrointestinal: Positive for abdominal pain and nausea. Negative for diarrhea and vomiting.  Genitourinary: Negative for dysuria.  Musculoskeletal: Negative for myalgias.  Skin: Negative for rash.  Neurological:  Negative for headaches.     Physical Exam Updated Vital Signs BP (!) 143/88 (BP Location: Right Arm)   Pulse 93   Temp (!) 97.5 F (36.4 C) (Oral)   Resp 18   Ht 5\' 3"  (1.6 m)   Wt 59.9 kg (132 lb)   SpO2 96%   BMI 23.38 kg/m   Physical Exam  Constitutional: She appears well-developed and well-nourished.  HENT:  Head: Normocephalic and atraumatic.  Eyes: Conjunctivae are normal. Right eye exhibits no discharge. Left eye exhibits no discharge.  Neck: Normal range of motion. Neck supple.  Cardiovascular: Normal rate, regular rhythm and normal heart sounds.  Pulmonary/Chest: Effort normal and breath sounds normal.  Abdominal: Soft. Bowel sounds are normal. There is generalized tenderness (Mild).  Neurological: She is alert.  Skin: Skin is warm and dry.  Psychiatric: She has a normal  mood and affect.  Nursing note and vitals reviewed.    ED Treatments / Results  Labs (all labs ordered are listed, but only abnormal results are displayed) Labs Reviewed  BASIC METABOLIC PANEL - Abnormal; Notable for the following components:      Result Value   Sodium 132 (*)    Glucose, Bld 113 (*)    BUN 5 (*)    Creatinine, Ser 0.35 (*)    Calcium 8.4 (*)    All other components within normal limits  HEPATIC FUNCTION PANEL - Abnormal; Notable for the following components:   Albumin 3.4 (*)    AST 229 (*)    ALT 240 (*)    Alkaline Phosphatase 297 (*)    Total Bilirubin 3.2 (*)    Bilirubin, Direct 2.2 (*)    Indirect Bilirubin 1.0 (*)    All other components within normal limits  CBC - Abnormal; Notable for the following components:   WBC 3.9 (*)    RBC 3.49 (*)    Hemoglobin 11.2 (*)    HCT 32.7 (*)    All other components within normal limits    EKG None  Radiology Ct Head Wo Contrast  Result Date: 02/24/2018 CLINICAL DATA:  67 year old female with acute headache and vomiting. Known metastatic lung cancer. EXAM: CT HEAD WITHOUT CONTRAST TECHNIQUE: Contiguous axial  images were obtained from the base of the skull through the vertex without intravenous contrast. COMPARISON:  01/12/2018 MR and prior studies FINDINGS: Brain: Small cerebral metastases identified on recent MR are difficult to visualize on CT today. No discrete mass or mass effect, infarct, hemorrhage, midline shift, extra-axial collection or hydrocephalus noted. Mild chronic small-vessel white matter ischemic changes noted. Vascular: Mild intracranial atherosclerotic calcifications noted. Skull: Normal. Negative for fracture or focal lesion. Sinuses/Orbits: Fluid within the LEFT sphenoid sinus again noted. Other: None. IMPRESSION: 1. No evidence of acute intracranial abnormality. Small cerebral metastases identified on 01/12/2018 MR are not well visualized by CT. 2. Mild chronic small-vessel white matter ischemic changes. Electronically Signed   By: Margarette Canada M.D.   On: 02/24/2018 21:28   Dg Chest Portable 1 View  Result Date: 02/24/2018 CLINICAL DATA:  Acute shortness of breath and fatigue. EXAM: PORTABLE CHEST 1 VIEW COMPARISON:  12/26/2017 PET CT, 12/19/2017 chest radiograph and priors FINDINGS: The cardiomediastinal silhouette is unremarkable. A RIGHT Port-A-Cath is again noted with tip overlying the mid SVC. RIGHT basilar atelectasis/scarring again noted. There is no evidence of focal airspace disease, pulmonary edema, suspicious pulmonary nodule/mass, pleural effusion, or pneumothorax. No acute bony abnormalities are identified. IMPRESSION: No evidence of acute cardiopulmonary disease. Electronically Signed   By: Margarette Canada M.D.   On: 02/24/2018 19:57    Procedures Procedures (including critical care time)  Medications Ordered in ED Medications - No data to display   Initial Impression / Assessment and Plan / ED Course  I have reviewed the triage vital signs and the nursing notes.  Pertinent labs & imaging results that were available during my care of the patient were reviewed by me and  considered in my medical decision making (see chart for details).     Patient seen and examined. Will recheck labs. Overall patient appears well given underlying pathology.   Vital signs reviewed and are as follows: BP (!) 143/88 (BP Location: Right Arm)   Pulse 93   Temp (!) 97.5 F (36.4 C) (Oral)   Resp 18   Ht 5\' 3"  (1.6 m)   Wt 59.9  kg (132 lb)   SpO2 96%   BMI 23.38 kg/m   I spoke with Dr. Penelope Coop of [facility] as patient was seen by [name] earlier this year. Recommends MRCP.   I spoke with hospitalist who defers admission until MRCP performed. If patient needs to be transferred to [facility], would not be beneficial to admit here.   Unfortunately when I went to update the patient, she was eating chips that she brought with her.  MRCP will not be performed until 4 hours after oral intake.  Patient aware.  LFTs are slightly improved however Tbili higher today.   Handoff to [place] PA-C at shift change.  Final Clinical Impressions(s) / ED Diagnoses   Final diagnoses:  Elevated liver function tests  Generalized abdominal pain   Pending completion of workup.   ED Discharge Orders    None       Carlisle Cater, PA-C 02/25/18 1503    Malvin Johns, MD 02/25/18 1506

## 2018-02-25 NOTE — ED Notes (Signed)
Call to [facility] regarding ETA for pt test. They state that they will come get pt now

## 2018-03-01 ENCOUNTER — Emergency Department (HOSPITAL_COMMUNITY): Payer: Medicare Other

## 2018-03-01 ENCOUNTER — Encounter (HOSPITAL_COMMUNITY): Payer: Self-pay

## 2018-03-01 ENCOUNTER — Inpatient Hospital Stay (HOSPITAL_COMMUNITY)
Admission: EM | Admit: 2018-03-01 | Discharge: 2018-03-03 | DRG: 641 | Disposition: A | Discharge status: Home / Self Care | Payer: Medicare Other | Attending: Internal Medicine | Admitting: Internal Medicine

## 2018-03-01 ENCOUNTER — Other Ambulatory Visit: Payer: Self-pay

## 2018-03-01 DIAGNOSIS — R1010 Upper abdominal pain, unspecified: Secondary | ICD-10-CM

## 2018-03-01 DIAGNOSIS — C7931 Secondary malignant neoplasm of brain: Secondary | ICD-10-CM | POA: Diagnosis present

## 2018-03-01 DIAGNOSIS — G8929 Other chronic pain: Secondary | ICD-10-CM | POA: Diagnosis present

## 2018-03-01 DIAGNOSIS — K8021 Calculus of gallbladder without cholecystitis with obstruction: Secondary | ICD-10-CM | POA: Diagnosis present

## 2018-03-01 DIAGNOSIS — R933 Abnormal findings on diagnostic imaging of other parts of digestive tract: Secondary | ICD-10-CM

## 2018-03-01 DIAGNOSIS — M8448XS Pathological fracture, other site, sequela: Secondary | ICD-10-CM | POA: Diagnosis present

## 2018-03-01 DIAGNOSIS — C787 Secondary malignant neoplasm of liver and intrahepatic bile duct: Secondary | ICD-10-CM | POA: Diagnosis present

## 2018-03-01 DIAGNOSIS — C3490 Malignant neoplasm of unspecified part of unspecified bronchus or lung: Secondary | ICD-10-CM | POA: Diagnosis not present

## 2018-03-01 DIAGNOSIS — E2740 Unspecified adrenocortical insufficiency: Secondary | ICD-10-CM | POA: Diagnosis present

## 2018-03-01 DIAGNOSIS — C7951 Secondary malignant neoplasm of bone: Secondary | ICD-10-CM | POA: Diagnosis present

## 2018-03-01 DIAGNOSIS — E860 Dehydration: Secondary | ICD-10-CM | POA: Diagnosis not present

## 2018-03-01 DIAGNOSIS — E871 Hypo-osmolality and hyponatremia: Secondary | ICD-10-CM | POA: Diagnosis present

## 2018-03-01 DIAGNOSIS — T40605A Adverse effect of unspecified narcotics, initial encounter: Secondary | ICD-10-CM | POA: Diagnosis present

## 2018-03-01 DIAGNOSIS — C799 Secondary malignant neoplasm of unspecified site: Secondary | ICD-10-CM | POA: Diagnosis present

## 2018-03-01 DIAGNOSIS — Z960 Presence of urogenital implants: Secondary | ICD-10-CM | POA: Diagnosis present

## 2018-03-01 DIAGNOSIS — R945 Abnormal results of liver function studies: Secondary | ICD-10-CM

## 2018-03-01 DIAGNOSIS — Z79899 Other long term (current) drug therapy: Secondary | ICD-10-CM

## 2018-03-01 DIAGNOSIS — K5903 Drug induced constipation: Secondary | ICD-10-CM | POA: Diagnosis present

## 2018-03-01 DIAGNOSIS — Z716 Tobacco abuse counseling: Secondary | ICD-10-CM

## 2018-03-01 DIAGNOSIS — C7970 Secondary malignant neoplasm of unspecified adrenal gland: Secondary | ICD-10-CM | POA: Diagnosis present

## 2018-03-01 DIAGNOSIS — Z9221 Personal history of antineoplastic chemotherapy: Secondary | ICD-10-CM

## 2018-03-01 DIAGNOSIS — J309 Allergic rhinitis, unspecified: Secondary | ICD-10-CM | POA: Diagnosis present

## 2018-03-01 DIAGNOSIS — Z87891 Personal history of nicotine dependence: Secondary | ICD-10-CM

## 2018-03-01 DIAGNOSIS — R1084 Generalized abdominal pain: Secondary | ICD-10-CM

## 2018-03-01 DIAGNOSIS — E440 Moderate protein-calorie malnutrition: Secondary | ICD-10-CM

## 2018-03-01 DIAGNOSIS — R112 Nausea with vomiting, unspecified: Secondary | ICD-10-CM | POA: Diagnosis not present

## 2018-03-01 DIAGNOSIS — Z6823 Body mass index (BMI) 23.0-23.9, adult: Secondary | ICD-10-CM

## 2018-03-01 DIAGNOSIS — M549 Dorsalgia, unspecified: Secondary | ICD-10-CM | POA: Diagnosis present

## 2018-03-01 DIAGNOSIS — E876 Hypokalemia: Secondary | ICD-10-CM | POA: Diagnosis present

## 2018-03-01 LAB — CBC
HEMATOCRIT: 37.7 % (ref 36.0–46.0)
HEMOGLOBIN: 12.6 g/dL (ref 12.0–15.0)
MCH: 31.6 pg (ref 26.0–34.0)
MCHC: 33.4 g/dL (ref 30.0–36.0)
MCV: 94.5 fL (ref 78.0–100.0)
Platelets: 289 10*3/uL (ref 150–400)
RBC: 3.99 MIL/uL (ref 3.87–5.11)
RDW: 15.6 % — ABNORMAL HIGH (ref 11.5–15.5)
WBC: 3.9 10*3/uL — ABNORMAL LOW (ref 4.0–10.5)

## 2018-03-01 LAB — LIPASE, BLOOD: Lipase: 23 U/L (ref 11–51)

## 2018-03-01 LAB — COMPREHENSIVE METABOLIC PANEL
ALK PHOS: 447 U/L — AB (ref 38–126)
ALT: 147 U/L — ABNORMAL HIGH (ref 0–44)
ANION GAP: 12 (ref 5–15)
AST: 140 U/L — ABNORMAL HIGH (ref 15–41)
Albumin: 3.4 g/dL — ABNORMAL LOW (ref 3.5–5.0)
BILIRUBIN TOTAL: 4.9 mg/dL — AB (ref 0.3–1.2)
BUN: 13 mg/dL (ref 8–23)
CALCIUM: 7.7 mg/dL — AB (ref 8.9–10.3)
CO2: 23 mmol/L (ref 22–32)
Chloride: 100 mmol/L (ref 98–111)
Creatinine, Ser: 0.51 mg/dL (ref 0.44–1.00)
Glucose, Bld: 150 mg/dL — ABNORMAL HIGH (ref 70–99)
Potassium: 3.4 mmol/L — ABNORMAL LOW (ref 3.5–5.1)
Sodium: 135 mmol/L (ref 135–145)
TOTAL PROTEIN: 6.9 g/dL (ref 6.5–8.1)

## 2018-03-01 LAB — URINALYSIS, ROUTINE W REFLEX MICROSCOPIC
Glucose, UA: NEGATIVE mg/dL
Hgb urine dipstick: NEGATIVE
KETONES UR: 5 mg/dL — AB
Leukocytes, UA: NEGATIVE
NITRITE: NEGATIVE
PH: 5 (ref 5.0–8.0)
Protein, ur: NEGATIVE mg/dL
Specific Gravity, Urine: 1.013 (ref 1.005–1.030)

## 2018-03-01 MED ORDER — SENNOSIDES-DOCUSATE SODIUM 8.6-50 MG PO TABS
2.0000 | ORAL_TABLET | Freq: Every day | ORAL | Status: DC
  Administered 2018-03-01: 2 via ORAL
  Filled 2018-03-01 (×2): qty 2

## 2018-03-01 MED ORDER — ONDANSETRON HCL 4 MG/2ML IJ SOLN: 4.0000 mg | Freq: Four times a day (QID) | INTRAMUSCULAR | Status: DC | PRN

## 2018-03-01 MED ORDER — SODIUM CHLORIDE 0.9 % IV BOLUS
1000.0000 mL | Freq: Once | INTRAVENOUS | Status: AC
Start: 2018-03-01 — End: 2018-03-01
  Administered 2018-03-01: 1000 mL via INTRAVENOUS

## 2018-03-01 MED ORDER — SODIUM CHLORIDE 0.9 % IV BOLUS
1000.0000 mL | Freq: Once | INTRAVENOUS | Status: AC
  Administered 2018-03-01: 1000 mL via INTRAVENOUS

## 2018-03-01 MED ORDER — ENOXAPARIN SODIUM 40 MG/0.4ML ~~LOC~~ SOLN
40.0000 mg | SUBCUTANEOUS | Status: DC
  Administered 2018-03-02: 40 mg via SUBCUTANEOUS
  Filled 2018-03-01: qty 0.4

## 2018-03-01 MED ORDER — ACETAMINOPHEN 325 MG PO TABS: 650.0000 mg | ORAL_TABLET | Freq: Four times a day (QID) | ORAL | Status: DC | PRN

## 2018-03-01 MED ORDER — POLYETHYLENE GLYCOL 3350 17 G PO PACK: 17.0000 g | PACK | Freq: Every day | ORAL | Status: DC | PRN

## 2018-03-01 MED ORDER — MORPHINE SULFATE (PF) 4 MG/ML IV SOLN
4.0000 mg | Freq: Once | INTRAVENOUS | Status: AC
  Administered 2018-03-01: 4 mg via INTRAVENOUS
  Filled 2018-03-01: qty 1

## 2018-03-01 MED ORDER — GI COCKTAIL ~~LOC~~
30.0000 mL | Freq: Three times a day (TID) | ORAL | Status: DC | PRN
  Filled 2018-03-01: qty 30

## 2018-03-01 MED ORDER — ONDANSETRON HCL 4 MG/2ML IJ SOLN
4.0000 mg | Freq: Once | INTRAMUSCULAR | Status: AC
  Administered 2018-03-01: 4 mg via INTRAVENOUS
  Filled 2018-03-01: qty 2

## 2018-03-01 MED ORDER — OXYCODONE HCL ER 15 MG PO T12A
15.0000 mg | EXTENDED_RELEASE_TABLET | Freq: Two times a day (BID) | ORAL | Status: DC
  Administered 2018-03-01 – 2018-03-03 (×4): 15 mg via ORAL
  Filled 2018-03-01 (×4): qty 1

## 2018-03-01 MED ORDER — SODIUM CHLORIDE 0.9 % IV SOLN: INTRAVENOUS | Status: DC

## 2018-03-01 MED ORDER — KETOROLAC TROMETHAMINE 15 MG/ML IJ SOLN: 15.0000 mg | Freq: Four times a day (QID) | INTRAMUSCULAR | Status: DC | PRN

## 2018-03-01 MED ORDER — MAGIC MOUTHWASH W/LIDOCAINE
10.0000 mL | Freq: Four times a day (QID) | ORAL | Status: DC | PRN
  Administered 2018-03-02 – 2018-03-03 (×2): 10 mL via ORAL
  Filled 2018-03-01 (×4): qty 10

## 2018-03-01 MED ORDER — HYDROCORTISONE 10 MG PO TABS
10.0000 mg | ORAL_TABLET | Freq: Every day | ORAL | Status: DC
  Administered 2018-03-02 – 2018-03-03 (×2): 10 mg via ORAL
  Filled 2018-03-01 (×2): qty 1

## 2018-03-01 MED ORDER — HYDROCORTISONE 5 MG PO TABS
5.0000 mg | ORAL_TABLET | Freq: Every day | ORAL | Status: DC
Start: 2018-03-02 — End: 2018-03-03
  Administered 2018-03-02 – 2018-03-03 (×3): 5 mg via ORAL
  Filled 2018-03-01 (×2): qty 1

## 2018-03-01 MED ORDER — ONDANSETRON HCL 4 MG PO TABS: 4.0000 mg | ORAL_TABLET | Freq: Four times a day (QID) | ORAL | Status: DC | PRN

## 2018-03-01 MED ORDER — POTASSIUM CHLORIDE IN NACL 40-0.9 MEQ/L-% IV SOLN
INTRAVENOUS | Status: DC
  Administered 2018-03-01 – 2018-03-02 (×3): 125 mL/h via INTRAVENOUS
  Administered 2018-03-03: 50 mL/h via INTRAVENOUS
  Filled 2018-03-01 (×6): qty 1000

## 2018-03-01 MED ORDER — ACETAMINOPHEN 650 MG RE SUPP: 650.0000 mg | Freq: Four times a day (QID) | RECTAL | Status: DC | PRN

## 2018-03-01 MED ORDER — FAMOTIDINE IN NACL 20-0.9 MG/50ML-% IV SOLN
20.0000 mg | Freq: Two times a day (BID) | INTRAVENOUS | Status: DC
  Administered 2018-03-01 – 2018-03-03 (×4): 20 mg via INTRAVENOUS
  Filled 2018-03-01 (×4): qty 50

## 2018-03-01 NOTE — ED Notes (Signed)
ED TO INPATIENT HANDOFF REPORT  Name/Age/Gender Haley [name] 67 y.o. female  Code Status    Code Status Orders  (From admission, onward)        Start     Ordered   03/01/18 1502  Full code  Continuous     03/01/18 1504    Code Status History    Date Active Date Inactive Code Status Order ID Comments User Context   12/19/2017 1836 12/22/2017 1424 Full Code [id number]  Velvet Bathe, MD Inpatient   10/13/2017 1742 10/18/2017 1712 Full Code [id number]  Theodis Blaze, MD Inpatient      Home/SNF/Other Home  Chief Complaint Abdominal Pain  Level of Care/Admitting Diagnosis ED Disposition    ED Disposition Condition [facility] Area: [facility] [[id number]]  Level of Care: Med-Surg [16]  Diagnosis: Dehydration [276.51.ICD-9-CM]  Admitting Physician: Aline August [[id number]]  Attending Physician: Aline August [[id number]]  PT Class (Do Not Modify): Observation [104]  PT Acc Code (Do Not Modify): Observation [10022]       Medical History Past Medical History:  Diagnosis Date  . Allergy   . Back pain   . Cancer ([place])   . Osteopenia     Allergies No Known Allergies  IV Location/Drains/Wounds Patient Lines/Drains/Airways Status   Active Line/Drains/Airways    Name:   Placement date:   Placement time:   Site:   Days:   Implanted Port 11/10/17 Right Chest   11/10/17    -    Chest   111   GI Stent 4 Fr.   10/15/17    1312    -   137          Labs/Imaging Results for orders placed or performed during the hospital encounter of 03/01/18 (from the past 48 hour(s))  CBC     Status: Abnormal   Collection Time: 03/01/18 12:19 PM  Result Value Ref Range   WBC 3.9 (L) 4.0 - 10.5 K/uL   RBC 3.99 3.87 - 5.11 MIL/uL   Hemoglobin 12.6 12.0 - 15.0 g/dL   HCT 37.7 36.0 - 46.0 %   MCV 94.5 78.0 - 100.0 fL   MCH 31.6 26.0 - 34.0 pg   MCHC 33.4 30.0 - 36.0 g/dL   RDW 15.6 (H) 11.5 - 15.5 %   Platelets 289 150 - 400 K/uL    Comment:  Performed at [facility], [place] [address] [place], [place] [address]  Comprehensive metabolic panel     Status: Abnormal   Collection Time: 03/01/18 12:19 PM  Result Value Ref Range   Sodium 135 135 - 145 mmol/L   Potassium 3.4 (L) 3.5 - 5.1 mmol/L   Chloride 100 98 - 111 mmol/L    Comment: Please note change in reference range.   CO2 23 22 - 32 mmol/L   Glucose, Bld 150 (H) 70 - 99 mg/dL    Comment: Please note change in reference range.   BUN 13 8 - 23 mg/dL    Comment: Please note change in reference range.   Creatinine, Ser 0.51 0.44 - 1.00 mg/dL   Calcium 7.7 (L) 8.9 - 10.3 mg/dL   Total Protein 6.9 6.5 - 8.1 g/dL   Albumin 3.4 (L) 3.5 - 5.0 g/dL   AST 140 (H) 15 - 41 U/L   ALT 147 (H) 0 - 44 U/L    Comment: Please note change in reference range.   Alkaline Phosphatase 447 (H) 38 -  126 U/L   Total Bilirubin 4.9 (H) 0.3 - 1.2 mg/dL   GFR calc non Af Amer >60 >60 mL/min   GFR calc Af Amer >60 >60 mL/min    Comment: (NOTE) The eGFR has been calculated using the CKD EPI equation. This calculation has not been validated in all clinical situations. eGFR's persistently <60 mL/min signify possible Chronic Kidney Disease.    Anion gap 12 5 - 15    Comment: Performed at [facility], [place] [address] [place], [place] [address]  Lipase, blood     Status: None   Collection Time: 03/01/18 12:19 PM  Result Value Ref Range   Lipase 23 11 - 51 U/L    Comment: Performed at [facility], [place] [address], [place], [place] [address]   Dg Abdomen Acute W/chest  Result Date: 03/01/2018 CLINICAL DATA:  Weakness, nausea, vomiting, small cell lung cancer EXAM: DG ABDOMEN ACUTE W/ 1V CHEST COMPARISON:  02/24/2018 chest radiograph. Abdominal sonogram from earlier today. 12/26/2017 PET-CT. 02/25/2018 MRI abdomen. FINDINGS: Right internal jugular MediPort terminates in the lower third of the SVC. Stable cardiomediastinal silhouette with normal  heart size. No pneumothorax. Stable slight blunting of the costophrenic angles bilaterally. No pulmonary edema. Mild streaky left lung base opacity, new. No acute consolidative airspace disease. CBD stent is in the expected location in the medial right upper abdomen. No dilated small bowel loops or air-fluid levels. Minimal gas and stool in the large bowel. No evidence of pneumatosis or pneumoperitoneum. No radiopaque nephrolithiasis. IMPRESSION: 1. Mild streaky left lung base opacity, favor mild atelectasis. 2. Stable slight blunting of the bilateral costophrenic angles, favor minimal pleural-parenchymal scarring, cannot exclude trace effusions. 3. Nonobstructive bowel gas pattern.  CBD stent in place. Electronically Signed   By: Ilona Sorrel M.D.   On: 03/01/2018 15:17   US Abdomen Limited Ruq  Result Date: 03/01/2018 CLINICAL DATA:  Upper abdominal pain for 8 days. History of metastatic pancreatic carcinoma. EXAM: ULTRASOUND ABDOMEN LIMITED RIGHT UPPER QUADRANT COMPARISON:  MRCP 02/25/2018. FINDINGS: Gallbladder: There is some sludge and a few small stones within the gallbladder. No gallbladder wall thickening or pericholecystic fluid is identified. Sonographer reports negative Murphy's sign. Common bile duct: Diameter: 12.2 cm with a common bile duct stent in place. Liver: Intrahepatic biliary ductal dilatation is seen as identified on the prior exam. 1.6 cm lesion in the right hepatic lobe is compatible with a metastatic deposit as seen on the prior exam. Portal vein is patent on color Doppler imaging with normal direction of blood flow towards the liver. IMPRESSION: No acute abnormality. Small volume of gallbladder sludge with a few stones. Negative for cholecystitis. Unchanged intra and extrahepatic biliary ductal dilatation with the common bile duct stent in place. Small lesion in the right lobe of the liver compatible with metastatic disease as seen on the prior exam. Electronically Signed   By: Inge Rise M.D.   On: 03/01/2018 13:04    Pending Labs Unresulted Labs (From admission, onward)   Start     Ordered   03/08/18 0500  Creatinine, serum  (enoxaparin (LOVENOX)    CrCl >/= 30 ml/min)  Weekly,   R    Comments:  while on enoxaparin therapy   Question:  Specimen collection method  Answer:  Unit=Unit collect   03/01/18 1504   03/02/18 0500  CBC  Tomorrow morning,   R    Question:  Specimen collection method  Answer:  Unit=Unit collect   03/01/18  1504   03/02/18 0500  Comprehensive metabolic panel  Tomorrow morning,   R    Question:  Specimen collection method  Answer:  Unit=Unit collect   03/01/18 1504   03/01/18 1532  Culture, Urine  Once,   R     03/01/18 1531   03/01/18 1532  Culture, blood (routine x 2)  BLOOD CULTURE X 2,   R     03/01/18 1531   03/01/18 1501  CBC  (enoxaparin (LOVENOX)    CrCl >/= 30 ml/min)  Once,   R    Comments:  Baseline for enoxaparin therapy IF NOT ALREADY DRAWN.  Notify MD if PLT < 100 K.   Question:  Specimen collection method  Answer:  Unit=Unit collect   03/01/18 1504   03/01/18 1501  Creatinine, serum  (enoxaparin (LOVENOX)    CrCl >/= 30 ml/min)  Once,   R    Comments:  Baseline for enoxaparin therapy IF NOT ALREADY DRAWN.   Question:  Specimen collection method  Answer:  Unit=Unit collect   03/01/18 1504   03/01/18 1444  Urinalysis, Routine w reflex microscopic  Once,   R     03/01/18 1443      Vitals/Pain Today's Vitals   03/01/18 1210 03/01/18 1320 03/01/18 1322 03/01/18 1435  BP:  (!) 105/57  119/66  Pulse:  (!) 108  (!) 114  Resp:  18  17  SpO2:  (S) 92%  93%  Weight:      Height:      PainSc: 7   5      Isolation Precautions No active isolations  Medications Medications  polyethylene glycol (MIRALAX / GLYCOLAX) packet 17 g (has no administration in time range)  hydrocortisone (CORTEF) tablet 10 mg (has no administration in time range)  magic mouthwash w/lidocaine (has no administration in time range)  oxyCODONE  (OXYCONTIN) 12 hr tablet 15 mg (has no administration in time range)  senna-docusate (Senokot-S) tablet 2 tablet (has no administration in time range)  enoxaparin (LOVENOX) injection 40 mg (has no administration in time range)  acetaminophen (TYLENOL) tablet 650 mg (has no administration in time range)    Or  acetaminophen (TYLENOL) suppository 650 mg (has no administration in time range)  ketorolac (TORADOL) 15 MG/ML injection 15 mg (has no administration in time range)  ondansetron (ZOFRAN) tablet 4 mg (has no administration in time range)    Or  ondansetron (ZOFRAN) injection 4 mg (has no administration in time range)  famotidine (PEPCID) IVPB 20 mg premix (has no administration in time range)  gi cocktail (Maalox,Lidocaine,Donnatal) (has no administration in time range)  0.9 % NaCl with KCl 40 mEq / L  infusion (has no administration in time range)  morphine 4 MG/ML injection 4 mg (4 mg Intravenous Given 03/01/18 1213)  ondansetron (ZOFRAN) injection 4 mg (4 mg Intravenous Given 03/01/18 1213)  sodium chloride 0.9 % bolus 1,000 mL (0 mLs Intravenous Stopped 03/01/18 1323)  sodium chloride 0.9 % bolus 1,000 mL (0 mLs Intravenous Stopped 03/01/18 1439)  sodium chloride 0.9 % bolus 1,000 mL (0 mLs Intravenous Stopped 03/01/18 1546)    Mobility non-ambulatory

## 2018-03-01 NOTE — ED Triage Notes (Signed)
Pt states that she is having abdominal pain today, and that it is her gallbladder. Pt states that she was having minor abd pain when she was seen last week and it is worse today. Pt currently being treated for lung cancer with radiation.

## 2018-03-01 NOTE — ED Provider Notes (Addendum)
[facility] DEPT Provider Note   CSN: [id number] Arrival date & time: 03/01/18  1040     History   Chief Complaint Chief Complaint  Patient presents with  . Abdominal Pain    HPI Haley [name] is a 67 y.o. female.  HPI Patient is a 67 year old female with a history of metastatic lung cancer with mets to her abdomen and a pancreatic head as well as mets to her brain.  She has multiple osseous mets as well.  She is currently undergoing treatment.  She presents the emergency department with nausea vomiting and upper abdominal discomfort.  She feels weak and tired.  She is been unable to keep fluids down over the past several days.  She was seen the emergency department on 02/24/2018 for fatigue and weakness and decreased appetite at that time.  She was seen again the following day on 02/25/2018 for similar symptoms and at that time underwent MRI of her abdomen with MRCP which demonstrated stable intra-and extrahepatic biliary dilatation.  Her common bile duct stent appears to be in good position without comp gating features.  She did have noted mild distention of her gallbladder and some layering sludge without other signs of cholecystitis.  GI consultation occurred as well as conversation with her oncology team and she was scheduled follow-up as an outpatient.  She states she continues having some nausea vomiting and abdominal pain and feels weak and dehydrated.  She is been unable to keep fluids down over the past 2 to 3 days.   Past Medical History:  Diagnosis Date  . Allergy   . Back pain   . Cancer ([place])   . Osteopenia     Patient Active Problem List   Diagnosis Date Noted  . Brain metastasis ([place]) 01/08/2018  . HCAP (healthcare-associated pneumonia) 12/19/2017  . 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 Bone metastasis ([place]) 10/22/2017  . Counseling regarding advanced care planning and goals of care 10/22/2017  .  Liver metastases ([place])   . 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 Metastatic disease ([place])   . Jaundice 10/13/2017  . Mood disorder ([place]) 05/12/2017  . Osteoarthritis 05/12/2017  . Osteopenia 05/06/2016  . Eustachian tube dysfunction 07/03/2015  . Allergic rhinitis 07/03/2015  . Tobacco use disorder 07/03/2015  . Hyperlipidemia 07/03/2015    Past Surgical History:  Procedure Laterality Date  . COLONOSCOPY    . COLONOSCOPY WITH PROPOFOL N/A 10/25/2014   Procedure: COLONOSCOPY WITH PROPOFOL;  Surgeon: Garlan Fair, MD;  Location: [facility] ENDOSCOPY;  Service: Endoscopy;  Laterality: N/A;  . ERCP N/A 10/15/2017   Procedure: ENDOSCOPIC RETROGRADE CHOLANGIOPANCREATOGRAPHY (ERCP);  Surgeon: Clarene Essex, MD;  Location: [facility];  Service: Endoscopy;  Laterality: N/A;  . IR FLUORO GUIDE PORT INSERTION RIGHT  11/10/2017  . IR RADIOLOGIST EVAL & MGMT  10/02/2017  . IR US GUIDE VASC ACCESS RIGHT  11/10/2017     OB History   None      Home Medications    Prior to Admission medications   Medication Sig Start Date End Date Taking? Authorizing Provider  calcium carbonate (OS-CAL - DOSED IN MG OF ELEMENTAL CALCIUM) 1250 (500 Ca) MG tablet Take 1 tablet by mouth daily.    Yes [provider]  hydrocortisone (CORTEF) 10 MG tablet Take 1 tablet (10 mg total) by mouth daily with breakfast. And 5mg  po daily with lunch. 02/09/18  Yes Brunetta Genera, MD  lidocaine-prilocaine (EMLA) cream  Apply to affected area once 10/22/17  Yes Kale, Cloria Spring, MD  LORazepam (ATIVAN) 0.5 MG tablet Take 1 tablet (0.5 mg total) by mouth every 6 (six) hours as needed (Nausea or vomiting). 10/22/17  Yes Brunetta Genera, MD  magic mouthwash w/lidocaine SOLN Take 10 mLs by mouth 4 (four) times daily as needed for mouth pain. Rinse and spit or swallow 12/08/17  Yes Tanner, Lyndon Code., PA-C  Multiple Vitamins-Minerals (MULTIVITAMIN WITH MINERALS) tablet Take 1 tablet by mouth daily.   Yes [provider]  ondansetron (ZOFRAN) 4 MG tablet Take 1 tablet (4 mg total) by mouth every 8 (eight) hours as needed for nausea or vomiting. 10/18/17  Yes Bonnielee Haff, MD  oxyCODONE (OXYCONTIN) 15 mg 12 hr tablet Take 1 tablet (15 mg total) by mouth every 12 (twelve) hours. 01/20/18  Yes Brunetta Genera, MD  prochlorperazine (COMPAZINE) 10 MG tablet Take 1 tablet (10 mg total) by mouth every 6 (six) hours as needed (Nausea or vomiting). 10/22/17  Yes Brunetta Genera, MD  senna-docusate (SENNA S) 8.6-50 MG tablet Take 2 tablets by mouth at bedtime. Patient taking differently: Take 2 tablets by mouth at bedtime as needed for mild constipation or moderate constipation.  09/30/17  Yes Brunetta Genera, MD  polyethylene glycol [facility]) packet Take 17 g by mouth daily as needed for moderate constipation. Patient not taking: Reported on 02/25/2018 10/18/17   Bonnielee Haff, MD  sodium chloride 1 g tablet Take 1 tablet (1 g total) by mouth 3 (three) times daily with meals. Patient not taking: Reported on 02/24/2018 02/09/18   Brunetta Genera, MD    Family History Family History  Problem Relation Age of Onset  . Diabetes Mother   . Hypertension Mother   . Diabetes Sister   . Hypertension Sister   . Hyperlipidemia Sister     Social History Social History   Tobacco Use  . Smoking status: Former Smoker    Packs/day: 1.00    Years: 40.00    Pack years: 40.00    Types: Cigarettes    Last attempt to quit: 09/24/2017    Years since quitting: 0.4  . Smokeless tobacco: Never Used  Substance Use Topics  . Alcohol use: Yes    Comment: rare social  . Drug use: No     Allergies   Patient has no known allergies.   Review of Systems Review of Systems  All other systems reviewed and are negative.    Physical Exam Updated Vital Signs BP (!) 105/57   Pulse (!) 108   Resp 18   Ht 5\' 3"  (1.6 m)   Wt 59.9 kg (132 lb)   SpO2 (S) 92%   BMI 23.38 kg/m   Physical Exam    Constitutional: She is oriented to person, place, and time.  Non-toxic appearance. She appears ill. No distress.  Pale.  Dehydrated appearing.  HENT:  Head: Normocephalic and atraumatic.  Eyes: EOM are normal.  Neck: Normal range of motion.  Cardiovascular: Normal rate, regular rhythm and normal heart sounds.  Pulmonary/Chest: Effort normal and breath sounds normal.  Abdominal: Soft. Bowel sounds are normal. She exhibits no distension. There is tenderness in the epigastric area.  Musculoskeletal: Normal range of motion.  Neurological: She is alert and oriented to person, place, and time.  Skin: Skin is warm and dry.  Psychiatric: She has a normal mood and affect. Judgment normal.  Nursing note and vitals reviewed.    ED Treatments /  Results  Labs (all labs ordered are listed, but only abnormal results are displayed) Labs Reviewed  CBC - Abnormal; Notable for the following components:      Result Value   WBC 3.9 (*)    RDW 15.6 (*)    All other components within normal limits  COMPREHENSIVE METABOLIC PANEL - Abnormal; Notable for the following components:   Potassium 3.4 (*)    Glucose, Bld 150 (*)    Calcium 7.7 (*)    Albumin 3.4 (*)    AST 140 (*)    ALT 147 (*)    Alkaline Phosphatase 447 (*)    Total Bilirubin 4.9 (*)    All other components within normal limits  LIPASE, BLOOD    EKG None  Radiology US Abdomen Limited Ruq  Result Date: 03/01/2018 CLINICAL DATA:  Upper abdominal pain for 8 days. History of metastatic pancreatic carcinoma. EXAM: ULTRASOUND ABDOMEN LIMITED RIGHT UPPER QUADRANT COMPARISON:  MRCP 02/25/2018. FINDINGS: Gallbladder: There is some sludge and a few small stones within the gallbladder. No gallbladder wall thickening or pericholecystic fluid is identified. Sonographer reports negative Murphy's sign. Common bile duct: Diameter: 12.2 cm with a common bile duct stent in place. Liver: Intrahepatic biliary ductal dilatation is seen as identified on  the prior exam. 1.6 cm lesion in the right hepatic lobe is compatible with a metastatic deposit as seen on the prior exam. Portal vein is patent on color Doppler imaging with normal direction of blood flow towards the liver. IMPRESSION: No acute abnormality. Small volume of gallbladder sludge with a few stones. Negative for cholecystitis. Unchanged intra and extrahepatic biliary ductal dilatation with the common bile duct stent in place. Small lesion in the right lobe of the liver compatible with metastatic disease as seen on the prior exam. Electronically Signed   By: Inge Rise M.D.   On: 03/01/2018 13:04    Procedures .Critical Care Performed by: Jola Schmidt, MD Authorized by: Jola Schmidt, MD     CRITICAL CARE Performed by: Jola Schmidt Total critical care time: 31 minutes Critical care time was exclusive of separately billable procedures and treating other patients. Critical care was necessary to treat or prevent imminent or life-threatening deterioration. Critical care was time spent personally by me on the following activities: development of treatment plan with patient and/or surrogate as well as nursing, discussions with consultants, evaluation of patient's response to treatment, examination of patient, obtaining history from patient or surrogate, ordering and performing treatments and interventions, ordering and review of laboratory studies, ordering and review of radiographic studies, pulse oximetry and re-evaluation of patient's condition.   Medications Ordered in ED Medications  sodium chloride 0.9 % bolus 1,000 mL (has no administration in time range)  morphine 4 MG/ML injection 4 mg (4 mg Intravenous Given 03/01/18 1213)  ondansetron (ZOFRAN) injection 4 mg (4 mg Intravenous Given 03/01/18 1213)  sodium chloride 0.9 % bolus 1,000 mL (0 mLs Intravenous Stopped 03/01/18 1323)  sodium chloride 0.9 % bolus 1,000 mL (1,000 mLs Intravenous New Bag/Given 03/01/18 1315)     Initial  Impression / Assessment and Plan / ED Course  I have reviewed the triage vital signs and the nursing notes.  Pertinent labs & imaging results that were available during my care of the patient were reviewed by me and considered in my medical decision making (see chart for details).     2:42 PM Feels some better after IV fluids but continues to feel weak.  She states sitting up makes  her feel worse.  I still think she is volume down.  She will be admitted for dehydration.  MRI from 02/25/2018 reviewed.  Ultrasound of her right upper quadrant today demonstrates no signs to suggest cholecystitis.  Patient will be admitted to hospital for ongoing hydration and symptom control.  This is now her third visit in the past 5 days.  Final Clinical Impressions(s) / ED Diagnoses   Final diagnoses:  Upper abdominal pain  Intractable vomiting with nausea, unspecified vomiting type  Acute dehydration    ED Discharge Orders    None       Jola Schmidt, MD 03/01/18 [place], MD 03/11/18 [phone]

## 2018-03-01 NOTE — ED Notes (Signed)
Bed: WA03 Expected date:  Expected time:  Means of arrival:  Comments: Triage 1 - [place] pt

## 2018-03-01 NOTE — H&P (Signed)
History and Physical    Haley [name] XTG:[id number] DOB: 1951-05-10 DOA: 03/01/2018  PCP: Alycia Rossetti, MD   Patient coming from: Home  I have personally briefly reviewed patient's old medical records in [facility]  Chief Complaint: Nausea, vomiting and abdominal pain  HPI: Haley [name] is a 67 y.o. female with medical history significant of small cell lung cancer with liver/bone/pancreatic/adrenal metastases currently on chemotherapy, obstructive jaundice due to biliary compression from pancreatic mass status post ERCP and stenting at [facility], history of tobacco abuse, back pain related to pathologic L2 fracture presented with nausea, vomiting and upper abdominal discomfort ongoing for the last more than a week.  She has had a very poor appetite with extreme fatigue and is unable to keep fluids down over the past several days.  She was seen in the emergency department on 02/24/2018 and had elevated LFTs, was discharged home but was subsequently seen again on 02/25/2018 and had an MRCP done which showed that her CBD stent was in good position and her LFTs were improving, she was sent home after IV fluids after discussion with GI/Dr. Penelope Coop and oncology.  Patient has not been able to tolerate any food/liquid since then.  No fever, cough, shortness of breath, chest pain, diarrhea, dysuria, hematuria, loss of consciousness or seizure.  She states that her upper abdomen hurts intermittently, up to 6-7 out of 10 in intensity, with no radiation or aggravating or relieving factors.  ED Course: She was given IV fluids.  Ultrasound of her right upper quadrant showed no signs of cholecystitis.  Patient felt extremely weak even after IV fluids.  Hospitalist service was called to evaluate the patient.  Review of Systems: As per HPI otherwise 10 point review of systems negative.    Past Medical History:  Diagnosis Date  . Allergy   . Back pain   . Cancer ([place])   . Osteopenia      Past Surgical History:  Procedure Laterality Date  . COLONOSCOPY    . COLONOSCOPY WITH PROPOFOL N/A 10/25/2014   Procedure: COLONOSCOPY WITH PROPOFOL;  Surgeon: Garlan Fair, MD;  Location: [facility] ENDOSCOPY;  Service: Endoscopy;  Laterality: N/A;  . ERCP N/A 10/15/2017   Procedure: ENDOSCOPIC RETROGRADE CHOLANGIOPANCREATOGRAPHY (ERCP);  Surgeon: Clarene Essex, MD;  Location: [facility];  Service: Endoscopy;  Laterality: N/A;  . IR FLUORO GUIDE PORT INSERTION RIGHT  11/10/2017  . IR RADIOLOGIST EVAL & MGMT  10/02/2017  . IR US GUIDE VASC ACCESS RIGHT  11/10/2017   Social history  reports that she quit smoking about 5 months ago. Her smoking use included cigarettes. She has a 40.00 pack-year smoking history. She has never used smokeless tobacco. She reports that she drinks alcohol. She reports that she does not use drugs.  Lives at home with family  No Known Allergies  Family History  Problem Relation Age of Onset  . Diabetes Mother   . Hypertension Mother   . Diabetes Sister   . Hypertension Sister   . Hyperlipidemia Sister     Prior to Admission medications   Medication Sig Start Date End Date Taking? Authorizing Provider  calcium carbonate (OS-CAL - DOSED IN MG OF ELEMENTAL CALCIUM) 1250 (500 Ca) MG tablet Take 1 tablet by mouth daily.    Yes [provider]  hydrocortisone (CORTEF) 10 MG tablet Take 1 tablet (10 mg total) by mouth daily with breakfast. And 5mg  po daily with lunch. 02/09/18  Yes Brunetta Genera, MD  lidocaine-prilocaine (EMLA) cream Apply to affected area once 10/22/17  Yes Kale, Cloria Spring, MD  LORazepam (ATIVAN) 0.5 MG tablet Take 1 tablet (0.5 mg total) by mouth every 6 (six) hours as needed (Nausea or vomiting). 10/22/17  Yes Brunetta Genera, MD  magic mouthwash w/lidocaine SOLN Take 10 mLs by mouth 4 (four) times daily as needed for mouth pain. Rinse and spit or swallow 12/08/17  Yes Tanner, Lyndon Code., PA-C  Multiple Vitamins-Minerals  (MULTIVITAMIN WITH MINERALS) tablet Take 1 tablet by mouth daily.   Yes [provider]  ondansetron (ZOFRAN) 4 MG tablet Take 1 tablet (4 mg total) by mouth every 8 (eight) hours as needed for nausea or vomiting. 10/18/17  Yes Bonnielee Haff, MD  oxyCODONE (OXYCONTIN) 15 mg 12 hr tablet Take 1 tablet (15 mg total) by mouth every 12 (twelve) hours. 01/20/18  Yes Brunetta Genera, MD  prochlorperazine (COMPAZINE) 10 MG tablet Take 1 tablet (10 mg total) by mouth every 6 (six) hours as needed (Nausea or vomiting). 10/22/17  Yes Brunetta Genera, MD  senna-docusate (SENNA S) 8.6-50 MG tablet Take 2 tablets by mouth at bedtime. Patient taking differently: Take 2 tablets by mouth at bedtime as needed for mild constipation or moderate constipation.  09/30/17  Yes Brunetta Genera, MD  polyethylene glycol [facility]) packet Take 17 g by mouth daily as needed for moderate constipation. Patient not taking: Reported on 02/25/2018 10/18/17   Bonnielee Haff, MD  sodium chloride 1 g tablet Take 1 tablet (1 g total) by mouth 3 (three) times daily with meals. Patient not taking: Reported on 02/24/2018 02/09/18   Brunetta Genera, MD    Physical Exam: Vitals:   03/01/18 1052 03/01/18 1058 03/01/18 1320 03/01/18 1435  BP: (!) 91/53  (!) 105/57 119/66  Pulse: 77  (!) 108 (!) 114  Resp: 16  18 17   SpO2: (!) 88%  (S) 92% 93%  Weight:  59.9 kg (132 lb)    Height:  5\' 3"  (1.6 m)      Constitutional: NAD, calm, comfortable.  Looks older than stated age.  Looks chronically ill. Vitals:   03/01/18 1052 03/01/18 1058 03/01/18 1320 03/01/18 1435  BP: (!) 91/53  (!) 105/57 119/66  Pulse: 77  (!) 108 (!) 114  Resp: 16  18 17   SpO2: (!) 88%  (S) 92% 93%  Weight:  59.9 kg (132 lb)    Height:  5\' 3"  (1.6 m)     Eyes: PERRL, lids and conjunctivae normal ENMT: Mucous membranes are dry. Posterior pharynx clear of any exudate or lesions. Neck: normal, supple, no masses, no thyromegaly Respiratory:  bilateral decreased breath sounds at bases, no wheezing, no crackles. Normal respiratory effort. No accessory muscle use.  Cardiovascular: S1 S2 positive, tachycardic. No extremity edema. 2+ pedal pulses.  Abdomen: Mild epigastric/periumbilical tenderness, no masses palpated. No hepatosplenomegaly. Bowel sounds positive.  Musculoskeletal: no clubbing / cyanosis. No joint deformity upper and lower extremities.  Skin: no rashes, lesions, ulcers. No induration Neurologic: CN 2-12 grossly intact. Moving extremities. No focal neurologic deficits.  Psychiatric: Normal judgment and insight. Alert and oriented x 3. Normal mood.    Labs on Admission: I have personally reviewed following labs and imaging studies  CBC: Recent Labs  Lab 02/24/18 1949 02/25/18 1335 03/01/18 1219  WBC 5.6 3.9* 3.9*  NEUTROABS 4.3  --   --   HGB 12.3 11.2* 12.6  HCT 36.0 32.7* 37.7  MCV 92.1 93.7 94.5  PLT 278  252 245   Basic Metabolic Panel: Recent Labs  Lab 02/24/18 1949 02/25/18 1335 03/01/18 1219  NA 127* 132* 135  K 3.9 3.6 3.4*  CL 95* 102 100  CO2 21* 22 23  GLUCOSE 142* 113* 150*  BUN 9 5* 13  CREATININE 0.44 0.35* 0.51  CALCIUM 8.7* 8.4* 7.7*   GFR: Estimated Creatinine Clearance: 58 mL/min (by C-G formula based on SCr of 0.51 mg/dL). Liver Function Tests: Recent Labs  Lab 02/24/18 1949 02/25/18 1335 03/01/18 1219  AST 538* 229* 140*  ALT 307* 240* 147*  ALKPHOS 309* 297* 447*  BILITOT 2.5* 3.2* 4.9*  PROT 6.7 6.5 6.9  ALBUMIN 3.6 3.4* 3.4*   Recent Labs  Lab 02/24/18 1949 03/01/18 1219  LIPASE 24 23   No results for input(s): AMMONIA in the last 168 hours. Coagulation Profile: No results for input(s): INR, PROTIME in the last 168 hours. Cardiac Enzymes: No results for input(s): CKTOTAL, CKMB, CKMBINDEX, TROPONINI in the last 168 hours. BNP (last 3 results) No results for input(s): PROBNP in the last 8760 hours. HbA1C: No results for input(s): HGBA1C in the last 72  hours. CBG: No results for input(s): GLUCAP in the last 168 hours. Lipid Profile: No results for input(s): CHOL, HDL, LDLCALC, TRIG, CHOLHDL, LDLDIRECT in the last 72 hours. Thyroid Function Tests: No results for input(s): TSH, T4TOTAL, FREET4, T3FREE, THYROIDAB in the last 72 hours. Anemia Panel: No results for input(s): VITAMINB12, FOLATE, FERRITIN, TIBC, IRON, RETICCTPCT in the last 72 hours. Urine analysis:    Component Value Date/Time   COLORURINE AMBER (A) 02/24/2018 2056   APPEARANCEUR CLEAR 02/24/2018 2056   LABSPEC 1.017 02/24/2018 2056   PHURINE 6.0 02/24/2018 2056   GLUCOSEU 50 (A) 02/24/2018 2056   HGBUR SMALL (A) 02/24/2018 2056   BILIRUBINUR NEGATIVE 02/24/2018 2056   KETONESUR 5 (A) 02/24/2018 2056   PROTEINUR NEGATIVE 02/24/2018 2056   NITRITE NEGATIVE 02/24/2018 2056   LEUKOCYTESUR NEGATIVE 02/24/2018 2056    Radiological Exams on Admission: US Abdomen Limited Ruq  Result Date: 03/01/2018 CLINICAL DATA:  Upper abdominal pain for 8 days. History of metastatic pancreatic carcinoma. EXAM: ULTRASOUND ABDOMEN LIMITED RIGHT UPPER QUADRANT COMPARISON:  MRCP 02/25/2018. FINDINGS: Gallbladder: There is some sludge and a few small stones within the gallbladder. No gallbladder wall thickening or pericholecystic fluid is identified. Sonographer reports negative Murphy's sign. Common bile duct: Diameter: 12.2 cm with a common bile duct stent in place. Liver: Intrahepatic biliary ductal dilatation is seen as identified on the prior exam. 1.6 cm lesion in the right hepatic lobe is compatible with a metastatic deposit as seen on the prior exam. Portal vein is patent on color Doppler imaging with normal direction of blood flow towards the liver. IMPRESSION: No acute abnormality. Small volume of gallbladder sludge with a few stones. Negative for cholecystitis. Unchanged intra and extrahepatic biliary ductal dilatation with the common bile duct stent in place. Small lesion in the right lobe  of the liver compatible with metastatic disease as seen on the prior exam. Electronically Signed   By: Inge Rise M.D.   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  Can be related to cancer/chemotherapy.  Recent MRI  of the abdomen showed stable CBD stent stable intrahepatic and extrahepatic biliary dilatation -Patient has cholelithiasis but no evidence of cholecystitis. -Clear liquid diet for now.  IV Pepcid, antiemetics, IV fluids -PRN GI cocktail -If no improvement in symptoms, might need GI/general surgery evaluation  Elevated LFTs -Questionable cause.  Her LFTs have been improving recently.  Might be secondary to hepatic metastases.  Repeat a.m. labs  Small cell lung cancer with metastases to liver/pancreas/brain/adrenals currently on chemotherapy -Outpatient follow-up with oncology/Dr. Irene Limbo  Hypokalemia -Replace.  Repeat a.m. Labs  Chronic back pain secondary to pathologic L2 fracture -Continue current pain management.  PT eval -Fall precautions   DVT prophylaxis: Lovenox Code Status: Full Family Communication: Spoke to patient and son at bedside Disposition Plan: Probable discharge in 1 to 2 days if clinically improves Consults called: None Admission status: Observation in MedSurg  Severity of Illness: The appropriate patient status for this patient is OBSERVATION. Observation status is judged to be reasonable and necessary in order to provide the required intensity of service to ensure the patient's safety. The patient's presenting symptoms, physical exam findings, and initial radiographic and laboratory data in the context of their medical condition is felt to place them  at decreased risk for further clinical deterioration. Furthermore, it is anticipated that the patient will be medically stable for discharge from the hospital within 2 midnights of admission. The following factors support the patient status of observation.   " The patient's presenting symptoms include nausea, vomiting and abdominal pain. " The physical exam findings include abdominal tenderness. " The initial radiographic and laboratory data are dehydration/cholelithiasis.      Aline August MD [facility] Pager [phone]  If 7PM-7AM, please contact night-coverage [email or web address] Password [email or web address]  03/01/2018, 3:04 PM

## 2018-03-02 ENCOUNTER — Inpatient Hospital Stay: Payer: Medicare Other | Admitting: Hematology

## 2018-03-02 ENCOUNTER — Inpatient Hospital Stay: Payer: Medicare Other

## 2018-03-02 ENCOUNTER — Observation Stay (HOSPITAL_COMMUNITY): Payer: Medicare Other

## 2018-03-02 ENCOUNTER — Encounter (HOSPITAL_COMMUNITY): Payer: Self-pay | Admitting: Radiology

## 2018-03-02 DIAGNOSIS — Z6823 Body mass index (BMI) 23.0-23.9, adult: Secondary | ICD-10-CM

## 2018-03-02 DIAGNOSIS — R1084 Generalized abdominal pain: Secondary | ICD-10-CM

## 2018-03-02 DIAGNOSIS — Z87891 Personal history of nicotine dependence: Secondary | ICD-10-CM | POA: Diagnosis not present

## 2018-03-02 DIAGNOSIS — R945 Abnormal results of liver function studies: Secondary | ICD-10-CM | POA: Diagnosis present

## 2018-03-02 DIAGNOSIS — E871 Hypo-osmolality and hyponatremia: Secondary | ICD-10-CM

## 2018-03-02 DIAGNOSIS — K5903 Drug induced constipation: Secondary | ICD-10-CM | POA: Diagnosis present

## 2018-03-02 DIAGNOSIS — Z9221 Personal history of antineoplastic chemotherapy: Secondary | ICD-10-CM | POA: Diagnosis not present

## 2018-03-02 DIAGNOSIS — M549 Dorsalgia, unspecified: Secondary | ICD-10-CM | POA: Diagnosis present

## 2018-03-02 DIAGNOSIS — C7970 Secondary malignant neoplasm of unspecified adrenal gland: Secondary | ICD-10-CM | POA: Diagnosis present

## 2018-03-02 DIAGNOSIS — E2740 Unspecified adrenocortical insufficiency: Secondary | ICD-10-CM | POA: Diagnosis present

## 2018-03-02 DIAGNOSIS — G8929 Other chronic pain: Secondary | ICD-10-CM | POA: Diagnosis present

## 2018-03-02 DIAGNOSIS — Z716 Tobacco abuse counseling: Secondary | ICD-10-CM | POA: Diagnosis not present

## 2018-03-02 DIAGNOSIS — R1010 Upper abdominal pain, unspecified: Secondary | ICD-10-CM | POA: Diagnosis present

## 2018-03-02 DIAGNOSIS — E43 Unspecified severe protein-calorie malnutrition: Secondary | ICD-10-CM

## 2018-03-02 DIAGNOSIS — Z960 Presence of urogenital implants: Secondary | ICD-10-CM | POA: Diagnosis present

## 2018-03-02 DIAGNOSIS — C3490 Malignant neoplasm of unspecified part of unspecified bronchus or lung: Secondary | ICD-10-CM | POA: Diagnosis present

## 2018-03-02 DIAGNOSIS — C7931 Secondary malignant neoplasm of brain: Secondary | ICD-10-CM | POA: Diagnosis present

## 2018-03-02 DIAGNOSIS — C787 Secondary malignant neoplasm of liver and intrahepatic bile duct: Secondary | ICD-10-CM | POA: Diagnosis present

## 2018-03-02 DIAGNOSIS — M8448XA Pathological fracture, other site, initial encounter for fracture: Secondary | ICD-10-CM

## 2018-03-02 DIAGNOSIS — J309 Allergic rhinitis, unspecified: Secondary | ICD-10-CM | POA: Diagnosis present

## 2018-03-02 DIAGNOSIS — E860 Dehydration: Secondary | ICD-10-CM | POA: Diagnosis present

## 2018-03-02 DIAGNOSIS — E440 Moderate protein-calorie malnutrition: Secondary | ICD-10-CM | POA: Diagnosis present

## 2018-03-02 DIAGNOSIS — K8021 Calculus of gallbladder without cholecystitis with obstruction: Secondary | ICD-10-CM | POA: Diagnosis present

## 2018-03-02 DIAGNOSIS — E876 Hypokalemia: Secondary | ICD-10-CM | POA: Diagnosis present

## 2018-03-02 DIAGNOSIS — C7951 Secondary malignant neoplasm of bone: Secondary | ICD-10-CM | POA: Diagnosis present

## 2018-03-02 DIAGNOSIS — Z79899 Other long term (current) drug therapy: Secondary | ICD-10-CM | POA: Diagnosis not present

## 2018-03-02 DIAGNOSIS — M8448XS Pathological fracture, other site, sequela: Secondary | ICD-10-CM | POA: Diagnosis present

## 2018-03-02 LAB — COMPREHENSIVE METABOLIC PANEL
ALT: 92 U/L — AB (ref 0–44)
AST: 61 U/L — AB (ref 15–41)
Albumin: 2.4 g/dL — ABNORMAL LOW (ref 3.5–5.0)
Alkaline Phosphatase: 282 U/L — ABNORMAL HIGH (ref 38–126)
Anion gap: 5 (ref 5–15)
BILIRUBIN TOTAL: 3.8 mg/dL — AB (ref 0.3–1.2)
BUN: 12 mg/dL (ref 8–23)
CO2: 22 mmol/L (ref 22–32)
CREATININE: 0.4 mg/dL — AB (ref 0.44–1.00)
Calcium: 6.1 mg/dL — CL (ref 8.9–10.3)
Chloride: 110 mmol/L (ref 98–111)
GFR calc Af Amer: >60 mL/min (ref >60)
GLUCOSE: 79 mg/dL (ref 70–99)
Potassium: 4.2 mmol/L (ref 3.5–5.1)
Sodium: 137 mmol/L (ref 135–145)
Total Protein: 5.1 g/dL — ABNORMAL LOW (ref 6.5–8.1)

## 2018-03-02 LAB — LACTIC ACID, PLASMA: Lactic Acid, Venous: 1.2 mmol/L (ref 0.5–1.9)

## 2018-03-02 LAB — CORTISOL: Cortisol, Plasma: 31.6 ug/dL

## 2018-03-02 LAB — CBC
HEMATOCRIT: 29.3 % — AB (ref 36.0–46.0)
Hemoglobin: 9.8 g/dL — ABNORMAL LOW (ref 12.0–15.0)
MCH: 31.8 pg (ref 26.0–34.0)
MCHC: 33.4 g/dL (ref 30.0–36.0)
MCV: 95.1 fL (ref 78.0–100.0)
Platelets: 224 10*3/uL (ref 150–400)
RBC: 3.08 MIL/uL — AB (ref 3.87–5.11)
RDW: 16.1 % — ABNORMAL HIGH (ref 11.5–15.5)
WBC: 12.2 10*3/uL — AB (ref 4.0–10.5)

## 2018-03-02 LAB — TSH: TSH: 1.392 u[IU]/mL (ref 0.350–4.500)

## 2018-03-02 LAB — AMYLASE: AMYLASE: 21 U/L — AB (ref 28–100)

## 2018-03-02 LAB — T4, FREE: FREE T4: 1.16 ng/dL (ref 0.82–1.77)

## 2018-03-02 LAB — LIPASE, BLOOD: Lipase: 23 U/L (ref 11–51)

## 2018-03-02 MED ORDER — IOPAMIDOL (ISOVUE-300) INJECTION 61%
15.0000 mL | Freq: Once | INTRAVENOUS | Status: DC | PRN
  Administered 2018-03-02: 15 mL via ORAL
  Filled 2018-03-02: qty 30

## 2018-03-02 MED ORDER — IOPAMIDOL (ISOVUE-300) INJECTION 61%
INTRAVENOUS | Status: AC
  Filled 2018-03-02: qty 30

## 2018-03-02 MED ORDER — SODIUM CHLORIDE 0.9 % IV SOLN
1.0000 g | Freq: Once | INTRAVENOUS | Status: AC
  Administered 2018-03-02: 1 g via INTRAVENOUS
  Filled 2018-03-02: qty 10

## 2018-03-02 MED ORDER — IOPAMIDOL (ISOVUE-300) INJECTION 61%
100.0000 mL | Freq: Once | INTRAVENOUS | Status: AC | PRN
  Administered 2018-03-02: 100 mL via INTRAVENOUS

## 2018-03-02 MED ORDER — IOPAMIDOL (ISOVUE-300) INJECTION 61%
INTRAVENOUS | Status: AC
  Filled 2018-03-02: qty 100

## 2018-03-02 MED ORDER — BOOST / RESOURCE BREEZE PO LIQD CUSTOM
1.0000 | Freq: Three times a day (TID) | ORAL | Status: DC
  Administered 2018-03-02 – 2018-03-03 (×2): 1 via ORAL

## 2018-03-02 NOTE — Progress Notes (Addendum)
CRITICAL VALUE ALERT  Critical Value: CALCIUM 6.1  Date & Time Notied: 03/02/2018 5:53AM  Provider Notified: DR. June Leap ON-CALL   Orders Received/Actions taken: DOCTOR ORDERED CALCIUM GLUCONATE IVPB

## 2018-03-02 NOTE — Evaluation (Addendum)
Physical Therapy Evaluation-1x Patient Details Name: NARALY FRITCHER MRN: [id number] DOB: 10/23/1950 Today's Date: 03/02/2018   History of Present Illness  67 yo female admitted with dehydration  Clinical Impression  On eval, pt was supervision level assist for mobility. Observed pt perform stand pivot from bsc to bed without assistance/difficulty. Explained reasoning for PT evaluation however pt denies need and declines PT services. Will sign off at pt's request.     Follow Up Recommendations No PT follow up (pt denies need for PT services)    Equipment Recommendations  None recommended by PT    Recommendations for Other Services       Precautions / Restrictions Precautions Precautions: Fall Restrictions Weight Bearing Restrictions: No      Mobility  Bed Mobility Overal bed mobility: Modified Independent Bed Mobility: Sit to Supine       Sit to supine: Modified independent (Device/Increase time)      Transfers Overall transfer level: Needs assistance   Transfers: Sit to/from Stand;Stand Pivot Transfers Sit to Stand: Supervision Stand pivot transfers: Supervision       General transfer comment: for safety. No LOB. No difficulty observed. Pt was able to perform toilet hygiene in standing before pivoting, bsc > bed.   Ambulation/Gait             General Gait Details: NT-pt declined to perform  Stairs            Wheelchair Mobility    Modified Rankin (Stroke Patients Only)       Balance                                             Pertinent Vitals/Pain Pain Assessment: No/denies pain    Home Living Family/patient expects to be discharged to:: Private residence Living Arrangements: Children Available Help at Discharge: Family Type of Home: House Home Access: Stairs to enter Entrance Stairs-Rails: None Entrance Stairs-Number of Steps: 1 Home Layout: Able to live on main level with bedroom/bathroom Home Equipment:  None;Wheelchair - manual      Prior Function Level of Independence: Independent               Hand Dominance        Extremity/Trunk Assessment   Upper Extremity Assessment Upper Extremity Assessment: Overall WFL for tasks assessed    Lower Extremity Assessment Lower Extremity Assessment: Overall WFL for tasks assessed    Cervical / Trunk Assessment Cervical / Trunk Assessment: Normal  Communication   Communication: No difficulties  Cognition Arousal/Alertness: Awake/alert Behavior During Therapy: WFL for tasks assessed/performed Overall Cognitive Status: Within Functional Limits for tasks assessed                                        General Comments      Exercises     Assessment/Plan    PT Assessment Patent does not need any further PT services  PT Problem List         PT Treatment Interventions      PT Goals (Current goals can be found in the Care Plan section)  Acute Rehab PT Goals Patient Stated Goal: none stated PT Goal Formulation: All assessment and education complete, DC therapy    Frequency     Barriers to discharge  Co-evaluation               AM-PAC PT "6 Clicks" Daily Activity  Outcome Measure Difficulty turning over in bed (including adjusting bedclothes, sheets and blankets)?: None Difficulty moving from lying on back to sitting on the side of the bed? : None Difficulty sitting down on and standing up from a chair with arms (e.g., wheelchair, bedside commode, etc,.)?: None Help needed moving to and from a bed to chair (including a wheelchair)?: None Help needed walking in hospital room?: A Little Help needed climbing 3-5 steps with a railing? : A Little 6 Click Score: 22    End of Session   Activity Tolerance: Patient tolerated treatment well Patient left: in bed;with call bell/phone within reach;with bed alarm set        Time: 1410-1418 PT Time Calculation (min) (ACUTE ONLY): 8  min   Charges:   PT Evaluation $PT Eval Moderate Complexity: 1 Low     PT G Codes:          Weston Anna, MPT Pager: [phone]

## 2018-03-02 NOTE — Progress Notes (Signed)
PROGRESS NOTE    YESICA KEMLER  RSW:[id number] DOB: [birth year] DOA: 03/01/2018 PCP: Alycia Rossetti, MD    Brief Narrative:  67 y.o. 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  She has had a very poor appetite with extreme fatigue and is unable to keep fluids down over the past several days.  She was seen in the emergency department on 02/24/2018 and had elevated LFTs, was discharged home but was subsequently seen again on 02/25/2018 and had an MRCP done which showed that her CBD stent was in good position and her LFTs were improving, she was sent home after IV fluids after discussion with GI/Dr. Penelope Coop and oncology.  Patient has not been able to tolerate any food/liquid since then.  No fever, cough, shortness of breath, chest pain, diarrhea, dysuria, hematuria, loss of consciousness or seizure.  She states that her upper abdomen hurts intermittently, up to 6-7 out of 10 in intensity, with no radiation or aggravating or relieving factors.  ED Course: She was given IV fluids.  Ultrasound of her right upper quadrant showed no signs of cholecystitis.  Patient felt extremely weak even after IV fluids.  Hospitalist service was called to evaluate the patient.  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  Recent MRI of the abdomen showed stable CBD stent stable intrahepatic and extrahepatic biliary dilatation -Patient has cholelithiasis but no evidence of cholecystitis. -Currently on clear liquid diet -PRN GI cocktail -CT abd ordered per Oncology. Known liver mets seen with L2 vertebral body pathologic fracture noted. Otherwise, no specific findings identified to explain current acute abd pain. Reviewed personally  Elevated LFTs -Questionable cause.   -Suspect secondary to hepatic metastases. -Recheck LFT in AM  Small cell lung cancer with metastases to liver/pancreas/brain/adrenals currently on chemotherapy -Followed by oncology/Dr. Irene Limbo -Have notified Dr. Irene Limbo. Appreciate input.  Hypokalemia -Replaced.   -Repeat bmet in AM  Chronic back pain secondary to pathologic L2 fracture -Continue with analgesia as tolerated -Fall precautions continued  DVT prophylaxis: Lovenox subq Code Status: Full Family Communication: Pt in room,family not at bedside Disposition Plan: Uncertain at this time  Consultants:   Oncology  Procedures:     Antimicrobials: Anti-infectives (From admission, onward)   None       Subjective: Complaining of continued abd pain  Objective: Vitals:   03/02/18 1651 03/02/18 1655 03/02/18 1659 03/02/18 1704  BP: (!) 144/84 (!) 144/83 (!) 148/88 (!) 151/89  Pulse: 79 81 86 93  Resp:      Temp:      TempSrc:      SpO2: 97% 99% 96% 97%  Weight:      Height:        Intake/Output Summary (Last 24 hours) at 03/02/2018 1825 Last data filed at 03/02/2018 1751 Gross per 24 hour  Intake 1783.75 ml  Output  500 ml  Net 1283.75 ml   Filed Weights   03/01/18 1058 03/01/18 1634  Weight: 59.9 kg (132 lb) 59.4 kg (131 lb)    Examination:  General exam: Appears calm and comfortable  Respiratory system: Clear to auscultation. Respiratory effort normal. Cardiovascular system: S1 & S2 heard, RRR Gastrointestinal system:  mildly distended, pos BS, generally tender to mild to mod palpation. Central nervous system: Alert and oriented. No focal neurological deficits. Extremities: Symmetric 5 x 5 power. Skin: No rashes, lesions  Psychiatry: Judgement and insight appear normal. Mood & affect appropriate.   Data Reviewed: I have personally reviewed following labs and imaging studies  CBC: Recent Labs  Lab 02/24/18 1949 02/25/18 1335 03/01/18 1219 03/02/18 0501  WBC 5.6 3.9* 3.9* 12.2*  NEUTROABS 4.3  --   --   --   HGB 12.3 11.2* 12.6 9.8*  HCT 36.0 32.7* 37.7 29.3*  MCV 92.1 93.7 94.5 95.1  PLT 278 252 289 023   Basic Metabolic Panel: Recent Labs  Lab 02/24/18 1949 02/25/18 1335 03/01/18 1219 03/02/18 0501  NA 127* 132* 135 137  K 3.9 3.6 3.4* 4.2  CL 95* 102 100 110  CO2 21* 22 23 22   GLUCOSE 142* 113* 150* 79  BUN 9 5* 13 12  CREATININE 0.44 0.35* 0.51 0.40*  CALCIUM 8.7* 8.4* 7.7* 6.1*   GFR: Estimated Creatinine Clearance: 58 mL/min (A) (by C-G formula based on SCr of 0.4 mg/dL (L)). Liver Function Tests: Recent Labs  Lab 02/24/18 1949 02/25/18 1335 03/01/18 1219 03/02/18 0501  AST 538* 229* 140* 61*  ALT 307* 240* 147* 92*  ALKPHOS 309* 297* 447* 282*  BILITOT 2.5* 3.2* 4.9* 3.8*  PROT 6.7 6.5 6.9 5.1*  ALBUMIN 3.6 3.4* 3.4* 2.4*   Recent Labs  Lab 02/24/18 1949 03/01/18 1219 03/02/18 1325  LIPASE 24 23 23   AMYLASE  --   --  21*   No results for input(s): AMMONIA in the last 168 hours. Coagulation Profile: No results for input(s): INR, PROTIME in the last 168 hours. Cardiac Enzymes: No results for input(s): CKTOTAL, CKMB, CKMBINDEX, TROPONINI in the last 168 hours. BNP (last 3 results) No results for input(s): PROBNP in the last 8760 hours. HbA1C: No results for input(s): HGBA1C in the last 72 hours. CBG: No results for input(s): GLUCAP in the last 168 hours. Lipid Profile: No results for input(s): CHOL, HDL, LDLCALC, TRIG, CHOLHDL, LDLDIRECT in the last 72  hours. Thyroid Function Tests: Recent Labs    03/02/18 1125  TSH 1.392  FREET4 1.16   Anemia Panel: No results for input(s): VITAMINB12, FOLATE, FERRITIN, TIBC, IRON, RETICCTPCT in the last 72 hours. Sepsis Labs: Recent Labs  Lab 03/02/18 1325  LATICACIDVEN 1.2    No results found for this or any previous visit (from the past 240 hour(s)).   Radiology Studies: Ct Abdomen Pelvis W Contrast  Result Date: 03/02/2018 CLINICAL DATA:  Abdominal pain, history of small cell lung cancer. EXAM: CT ABDOMEN AND PELVIS WITH CONTRAST TECHNIQUE: Multidetector CT imaging of the abdomen and pelvis was performed using the standard protocol following bolus administration of intravenous contrast. CONTRAST:  147mL ISOVUE-300 IOPAMIDOL (ISOVUE-300) INJECTION 61%, 51mL ISOVUE-300 IOPAMIDOL (ISOVUE-300) INJECTION 61% COMPARISON:  12/26/2017 FINDINGS: Lower chest: Small bilateral pleural effusions are again noted. Right lower lobe lung mass with postobstructive atelectasis and consolidation is again identified.Superimposed interstitial thickening noted which may reflect pulmonary edema. Hepatobiliary: There is no acute liver findings. A stent is identified within the common bile  duct. Pneumobilia is identified reflecting biliary patency. Liver metastasis within the posterior right lobe measures 1 cm, image 28/2. Unchanged from previous exam. Segment 4b liver metastasis measures 1.1 cm, image 39/2. Previously 1 cm. Diffuse periportal edema is identified. Mild gallbladder wall edema noted. No gallstones or pericholecystic fluid identified. Pancreas: Unremarkable appearance of the pancreas. Pancreatic head mass is no longer measurable. Spleen: Spleen normal. Adrenals/Urinary Tract: Unremarkable appearance of the adrenal glands. Kidneys are normal. Urinary bladder appears within normal limits. Stomach/Bowel: Stomach normal. The small bowel loops have a normal course and caliber without obstruction. The appendix is  visualized and appears normal. The colon is negative. Vascular/Lymphatic: Aortic atherosclerosis without aneurysm. No upper abdominal adenopathy. No pelvic or inguinal adenopathy. Reproductive: Uterus and bilateral adnexa are unremarkable. Other: There is a small volume of free fluid identified within the pelvis. New from previous PET-CT dated 12/26/2017. Musculoskeletal: Widespread sclerotic bone metastases are identified. Again noted is a vertically oriented pathologic fracture involving the L2 vertebral body with retropulsion and spinal canal encroachment as demonstrated previously, image 63/6. IMPRESSION: 1. No specific findings identified to explain patient's acute abdominal pain. 2. Bilateral pleural effusions and mild interstitial edema may reflect CHF. 3. Right hilar lung mass with post obstructive pneumonitis of the right lower lobe is again noted. 4. Biliary stent remains in place with pneumobilia reflecting biliary patency. 5. Liver metastasis as described on 02/25/2018. 6. New small volume of free fluid within the pelvis.  Nonspecific. 7. Widespread bone metastasis with pathologic fracture involving the L2 vertebral body as noted previously. Electronically Signed   By: Kerby Moors M.D.   On: 03/02/2018 17:01   Dg Abdomen Acute W/chest  Result Date: 03/01/2018 CLINICAL DATA:  Weakness, nausea, vomiting, small cell lung cancer EXAM: DG ABDOMEN ACUTE W/ 1V CHEST COMPARISON:  02/24/2018 chest radiograph. Abdominal sonogram from earlier today. 12/26/2017 PET-CT. 02/25/2018 MRI abdomen. FINDINGS: Right internal jugular MediPort terminates in the lower third of the SVC. Stable cardiomediastinal silhouette with normal heart size. No pneumothorax. Stable slight blunting of the costophrenic angles bilaterally. No pulmonary edema. Mild streaky left lung base opacity, new. No acute consolidative airspace disease. CBD stent is in the expected location in the medial right upper abdomen. No dilated small bowel  loops or air-fluid levels. Minimal gas and stool in the large bowel. No evidence of pneumatosis or pneumoperitoneum. No radiopaque nephrolithiasis. IMPRESSION: 1. Mild streaky left lung base opacity, favor mild atelectasis. 2. Stable slight blunting of the bilateral costophrenic angles, favor minimal pleural-parenchymal scarring, cannot exclude trace effusions. 3. Nonobstructive bowel gas pattern.  CBD stent in place. Electronically Signed   By: Ilona Sorrel M.D.   On: 03/01/2018 15:17   US Abdomen Limited Ruq  Result Date: 03/01/2018 CLINICAL DATA:  Upper abdominal pain for 8 days. History of metastatic pancreatic carcinoma. EXAM: ULTRASOUND ABDOMEN LIMITED RIGHT UPPER QUADRANT COMPARISON:  MRCP 02/25/2018. FINDINGS: Gallbladder: There is some sludge and a few small stones within the gallbladder. No gallbladder wall thickening or pericholecystic fluid is identified. Sonographer reports negative Murphy's sign. Common bile duct: Diameter: 12.2 cm with a common bile duct stent in place. Liver: Intrahepatic biliary ductal dilatation is seen as identified on the prior exam. 1.6 cm lesion in the right hepatic lobe is compatible with a metastatic deposit as seen on the prior exam. Portal vein is patent on color Doppler imaging with normal direction of blood flow towards the liver. IMPRESSION: No acute abnormality. Small volume of gallbladder sludge with a few stones. Negative  for cholecystitis. Unchanged intra and extrahepatic biliary ductal dilatation with the common bile duct stent in place. Small lesion in the right lobe of the liver compatible with metastatic disease as seen on the prior exam. Electronically Signed   By: Inge Rise M.D.   On: 03/01/2018 13:04    Scheduled Meds: . enoxaparin (LOVENOX) injection  40 mg Subcutaneous Q24H  . feeding supplement  1 Container Oral TID BM  . hydrocortisone  10 mg Oral Q breakfast  . hydrocortisone  5 mg Oral Q lunch  . iopamidol      . iopamidol      .  oxyCODONE  15 mg Oral Q12H  . senna-docusate  2 tablet Oral QHS   Continuous Infusions: . 0.9 % NaCl with KCl 40 mEq / L 125 mL/hr (03/02/18 1000)  . famotidine (PEPCID) IV Stopped (03/02/18 1029)     LOS: 0 days   Marylu Lund, MD [facility] Pager [phone]  If 7PM-7AM, please contact night-coverage [email or web address] Password [facility] 03/02/2018, 6:25 PM

## 2018-03-02 NOTE — Progress Notes (Signed)
Initial Nutrition Assessment  DOCUMENTATION CODES:   Non-severe (moderate) malnutrition in context of chronic illness  INTERVENTION:   Provide Boost Breeze po TID, each supplement provides 250 kcal and 9 grams of protein  NUTRITION DIAGNOSIS:   Moderate Malnutrition related to cancer and cancer related treatments, chronic illness as evidenced by moderate fat depletion, moderate muscle depletion.  GOAL:   Patient will meet greater than or equal to 90% of their needs  MONITOR:   PO intake, Diet advancement, Labs, Weight trends, I & O's  REASON FOR ASSESSMENT:   Consult Poor PO  ASSESSMENT:   67 y.o. 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 Pt is followed by [facility] and pt was diagnosed with severe malnutrition but was improving as far as PO intakes when last seen on 6/17. Pt currently on clear liquids. Will order Boost Breeze supplements while on clears. Pt was consuming Ensure supplements at home PTA.   Per weight records, UBW is ~130 lb. Weight is stable currently.   Labs reviewed. Medications reviewed.  NUTRITION - FOCUSED PHYSICAL EXAM:    Most Recent Value  Orbital Region  Moderate depletion  Upper Arm Region  No depletion  Thoracic and Lumbar Region  Unable to assess  Buccal Region  Mild depletion  Temple Region  Moderate depletion  Clavicle Bone Region  Moderate depletion  Clavicle and Acromion Bone Region  Moderate depletion  Scapular Bone Region  Unable to assess  Dorsal Hand  Mild depletion  Patellar Region   Mild depletion  Anterior Thigh Region  Mild depletion  Posterior Calf Region  Mild depletion  Edema (RD Assessment)  None       Diet Order:   Diet Order           Diet clear liquid Room service appropriate? Yes; Fluid consistency: Thin  Diet effective now          EDUCATION NEEDS:   No education needs have been identified at this time  Skin:  Skin Assessment: Reviewed RN Assessment  Last BM:  7/8  Height:   Ht Readings from Last 1 Encounters:  03/01/18 5\' 3"  (1.6 m)    Weight:   Wt Readings from Last 1 Encounters:  03/01/18 131 lb (59.4 kg)    Ideal Body Weight:  52.3 kg  BMI:  Body mass index is 23.21 kg/m.  Estimated Nutritional Needs:   Kcal:  9983-3825  Protein:  80-90g  Fluid:  1.9L/day   Clayton Bibles, MS, RD, LDN [place] [facility]itian Pager: [phone] After Hours Pager: [phone]

## 2018-03-02 NOTE — Progress Notes (Signed)
HEMATOLOGY/ONCOLOGY INPATIENT PROGRESS NOTE  Date of Service: 03/02/2018  Inpatient Attending: .Donne Hazel, MD   SUBJECTIVE:   Haley [name] reports that she is feeling miserable today. She had some loose stools without blood or mucous for her past 3 bowel movements. She has not used stool softeners recently and has had soft-stool bowel movements each morning. She notes that her stomach hurts all over. She states that she is continuing to have nausea and vomiting, which is preventing her from eating resulting in decreased albumin levels, hypocalcemia and weight loss.  In the physical exam she notes that her abdomen is painful to touch and also when abdominal pressure is released.  She notes that her throat hurts as well.   Lab results today (03/02/18) of CBC, CMP  is as follows: all values are WNL except for WBC at 12.2k, RBC at 3.08, HGB at 9.8, HCT at 29.3, RDW at 16.1, Creatinine at 0.40, Calcium at 6.1, Total Protein at 5.1, Albumin at 2.4, AST at 61, ALT at 92, Alk Phos at 282, Total Bilirubin at 3.8.  On review of systems, pt reports abdominal pain, recent diarrhea, throat pain, and lack of appetite.   OBJECTIVE:  NAD  PHYSICAL EXAMINATION: . Vitals:   03/02/18 0508 03/02/18 0509 03/02/18 0512 03/02/18 0513  BP: 106/75  104/71 100/67  Pulse:  82 88 87  Resp: 17     Temp: 97.9 F (36.6 C)     TempSrc: Oral     SpO2:  96% 99% 94%  Weight:      Height:       Filed Weights   03/01/18 1058 03/01/18 1634  Weight: 132 lb (59.9 kg) 131 lb (59.4 kg)   .Body mass index is 23.21 kg/m.  GENERAL:alert, mild distress due to abdominal discomfort SKIN: no acute rashes,  EYES: normal, conjunctiva are pink and non-injected, mild scleral icterus OROPHARYNX: no exudate, no erythema and lips, buccal mucosa, and tongue normal  NECK: supple, no JVD, thyroid normal size, non-tender, without nodularity LYMPH:  no palpable lymphadenopathy in the cervical, axillary or  inguinal LUNGS: clear to auscultation with normal respiratory effort HEART: regular rate & rhythm,  no murmurs and no lower extremity edema ABDOMEN: abdomen mild guarding, diffuse tenderness to palpation and some ?rebound. BS+ PSYCH: alert & oriented x 3 with fluent speech NEURO: no focal motor/sensory deficits  MEDICAL HISTORY:  Past Medical History:  Diagnosis Date  . Allergy   . Back pain   . Cancer ([place])   . Osteopenia     SURGICAL HISTORY: Past Surgical History:  Procedure Laterality Date  . COLONOSCOPY    . COLONOSCOPY WITH PROPOFOL N/A 10/25/2014   Procedure: COLONOSCOPY WITH PROPOFOL;  Surgeon: Garlan Fair, MD;  Location: [facility];  Service: Endoscopy;  Laterality: N/A;  . ERCP N/A 10/15/2017   Procedure: ENDOSCOPIC RETROGRADE CHOLANGIOPANCREATOGRAPHY (ERCP);  Surgeon: Clarene Essex, MD;  Location: [facility];  Service: Endoscopy;  Laterality: N/A;  . IR FLUORO GUIDE PORT INSERTION RIGHT  11/10/2017  . IR RADIOLOGIST EVAL & MGMT  10/02/2017  . IR US GUIDE VASC ACCESS RIGHT  11/10/2017    SOCIAL HISTORY: Social History   Socioeconomic History  . Marital status: Single    Spouse name: Not on file  . Number of children: Not on file  . Years of education: Not on file  . Highest education level: Not on file  Occupational History  . Not on file  Social Needs  . Financial  resource strain: Not on file  . Food insecurity:    Worry: Not on file    Inability: Not on file  . Transportation needs:    Medical: Not on file    Non-medical: Not on file  Tobacco Use  . Smoking status: Former Smoker    Packs/day: 1.00    Years: 40.00    Pack years: 40.00    Types: Cigarettes    Last attempt to quit: 09/24/2017    Years since quitting: 0.4  . Smokeless tobacco: Never Used  Substance and Sexual Activity  . Alcohol use: Yes    Comment: rare social  . Drug use: No  . Sexual activity: Not Currently  Lifestyle  . Physical activity:    Days per week: Not on file     Minutes per session: Not on file  . Stress: Not on file  Relationships  . Social connections:    Talks on phone: Not on file    Gets together: Not on file    Attends religious service: Not on file    Active member of club or organization: Not on file    Attends meetings of clubs or organizations: Not on file    Relationship status: Not on file  . Intimate partner violence:    Fear of current or ex partner: Not on file    Emotionally abused: Not on file    Physically abused: Not on file    Forced sexual activity: Not on file  Other Topics Concern  . Not on file  Social History Narrative  . Not on file    FAMILY HISTORY: Family History  Problem Relation Age of Onset  . Diabetes Mother   . Hypertension Mother   . Diabetes Sister   . Hypertension Sister   . Hyperlipidemia Sister     ALLERGIES:  has No Known Allergies.  MEDICATIONS:  Scheduled Meds: . enoxaparin (LOVENOX) injection  40 mg Subcutaneous Q24H  . feeding supplement  1 Container Oral TID BM  . hydrocortisone  10 mg Oral Q breakfast  . hydrocortisone  5 mg Oral Q lunch  . oxyCODONE  15 mg Oral Q12H  . senna-docusate  2 tablet Oral QHS   Continuous Infusions: . 0.9 % NaCl with KCl 40 mEq / L 125 mL/hr (03/02/18 1000)  . famotidine (PEPCID) IV Stopped (03/02/18 1029)   PRN Meds:.acetaminophen **OR** acetaminophen, gi cocktail, ketorolac, magic mouthwash w/lidocaine, ondansetron **OR** ondansetron (ZOFRAN) IV, polyethylene glycol  REVIEW OF SYSTEMS:    10 Point review of Systems was done is negative except as noted above.   LABORATORY DATA:  I have reviewed the data as listed  CBC Latest Ref Rng & Units 03/02/2018 03/01/2018 02/25/2018  WBC 4.0 - 10.5 K/uL 12.2(H) 3.9(L) 3.9(L)  Hemoglobin 12.0 - 15.0 g/dL 9.8(L) 12.6 11.2(L)  Hematocrit 36.0 - 46.0 % 29.3(L) 37.7 32.7(L)  Platelets 150 - 400 K/uL 224 289 252    CBC    Component Value Date/Time   WBC 12.2 (H) 03/02/2018 0501   RBC 3.08 (L) 03/02/2018  0501   HGB 9.8 (L) 03/02/2018 0501   HGB 10.9 (L) 12/29/2017 0815   HCT 29.3 (L) 03/02/2018 0501   PLT 224 03/02/2018 0501   PLT 237 12/29/2017 0815   MCV 95.1 03/02/2018 0501   MCH 31.8 03/02/2018 0501   MCHC 33.4 03/02/2018 0501   RDW 16.1 (H) 03/02/2018 0501   LYMPHSABS 0.6 (L) 02/24/2018 1949   MONOABS 0.7 02/24/2018 1949  EOSABS 0.0 02/24/2018 1949   BASOSABS 0.0 02/24/2018 1949    . CMP Latest Ref Rng & Units 03/02/2018 03/01/2018 02/25/2018  Glucose 70 - 99 mg/dL 79 150(H) 113(H)  BUN 8 - 23 mg/dL 12 13 5(L)  Creatinine 0.44 - 1.00 mg/dL 0.40(L) 0.51 0.35(L)  Sodium 135 - 145 mmol/L 137 135 132(L)  Potassium 3.5 - 5.1 mmol/L 4.2 3.4(L) 3.6  Chloride 98 - 111 mmol/L 110 100 102  CO2 22 - 32 mmol/L _0 Calcium 8.9 - 10.3 mg/dL 6.1(LL) 7.7(L) 8.4(L)  Total Protein 6.5 - 8.1 g/dL 5.1(L) 6.9 6.5  Total Bilirubin 0.3 - 1.2 mg/dL 3.8(H) 4.9(H) 3.2(H)  Alkaline Phos 38 - 126 U/L 282(H) 447(H) 297(H)  AST 15 - 41 U/L 61(H) 140(H) 229(H)  ALT 0 - 44 U/L 92(H) 147(H) 240(H)    RADIOGRAPHIC STUDIES: I have personally reviewed the radiological images as listed and agreed with the findings in the report. Ct Head Wo Contrast  Result Date: 02/24/2018 CLINICAL DATA:  67 year old female with acute headache and vomiting. Known metastatic lung cancer. EXAM: CT HEAD WITHOUT CONTRAST TECHNIQUE: Contiguous axial images were obtained from the base of the skull through the vertex without intravenous contrast. COMPARISON:  01/12/2018 MR and prior studies FINDINGS: Brain: Small cerebral metastases identified on recent MR are difficult to visualize on CT today. No discrete mass or mass effect, infarct, hemorrhage, midline shift, extra-axial collection or hydrocephalus noted. Mild chronic small-vessel white matter ischemic changes noted. Vascular: Mild intracranial atherosclerotic calcifications noted. Skull: Normal. Negative for fracture or focal lesion. Sinuses/Orbits: Fluid within the LEFT sphenoid  sinus again noted. Other: None. IMPRESSION: 1. No evidence of acute intracranial abnormality. Small cerebral metastases identified on 01/12/2018 MR are not well visualized by CT. 2. Mild chronic small-vessel white matter ischemic changes. Electronically Signed   By: Margarette Canada M.D.   On: 02/24/2018 21:28   Mr 3d Recon At Scanner  Result Date: 03/02/2018 CLINICAL DATA:  Nonspecific (abnormal) findings on radiological and other examination of musculoskeletal sysem. EXAM: 3-DIMENSIONAL MR IMAGE RENDERING ON AQUISITION WORKSTATION TECHNIQUE: 3-dimensional MR images were rendered by post-processing of the original MR data on an aquisition workstation. The 3-dimensional MR images were interpreted and findings were reported in the accompanying complete MR report for this study. Please see accession number [id number] for Dr. Kathie Dike report on this case. Electronically Signed   By: Van Clines M.D.   On: 03/02/2018 06:40   Dg Chest Portable 1 View  Result Date: 02/24/2018 CLINICAL DATA:  Acute shortness of breath and fatigue. EXAM: PORTABLE CHEST 1 VIEW COMPARISON:  12/26/2017 PET CT, 12/19/2017 chest radiograph and priors FINDINGS: The cardiomediastinal silhouette is unremarkable. A RIGHT Port-A-Cath is again noted with tip overlying the mid SVC. RIGHT basilar atelectasis/scarring again noted. There is no evidence of focal airspace disease, pulmonary edema, suspicious pulmonary nodule/mass, pleural effusion, or pneumothorax. No acute bony abnormalities are identified. IMPRESSION: No evidence of acute cardiopulmonary disease. Electronically Signed   By: Margarette Canada M.D.   On: 02/24/2018 19:57   Dg Abdomen Acute W/chest  Result Date: 03/01/2018 CLINICAL DATA:  Weakness, nausea, vomiting, small cell lung cancer EXAM: DG ABDOMEN ACUTE W/ 1V CHEST COMPARISON:  02/24/2018 chest radiograph. Abdominal sonogram from earlier today. 12/26/2017 PET-CT. 02/25/2018 MRI abdomen. FINDINGS: Right internal jugular  MediPort terminates in the lower third of the SVC. Stable cardiomediastinal silhouette with normal heart size. No pneumothorax. Stable slight blunting of the costophrenic angles bilaterally. No pulmonary edema. Mild  streaky left lung base opacity, new. No acute consolidative airspace disease. CBD stent is in the expected location in the medial right upper abdomen. No dilated small bowel loops or air-fluid levels. Minimal gas and stool in the large bowel. No evidence of pneumatosis or pneumoperitoneum. No radiopaque nephrolithiasis. IMPRESSION: 1. Mild streaky left lung base opacity, favor mild atelectasis. 2. Stable slight blunting of the bilateral costophrenic angles, favor minimal pleural-parenchymal scarring, cannot exclude trace effusions. 3. Nonobstructive bowel gas pattern.  CBD stent in place. Electronically Signed   By: Ilona Sorrel M.D.   On: 03/01/2018 15:17   Mr Abdomen Mrcp Moise Boring Contast  Result Date: 02/25/2018 CLINICAL DATA:  Metastatic pancreatic cancer with biliary stent. Elevated liver function studies. EXAM: MRI ABDOMEN WITHOUT AND WITH CONTRAST (INCLUDING MRCP) TECHNIQUE: Multiplanar multisequence MR imaging of the abdomen was performed both before and after the administration of intravenous contrast. Heavily T2-weighted images of the biliary and pancreatic ducts were obtained, and three-dimensional MRCP images were rendered by post processing. CONTRAST:  53m MULTIHANCE GADOBENATE DIMEGLUMINE 529 MG/ML IV SOLN COMPARISON:  MRI abdomen 10/15/2017 and PET-CT 12/26/2017 FINDINGS: Lower chest: Persistent right basilar airspace consolidation. No pleural effusions. No pericardial effusion. No obvious pulmonary lesions. Hepatobiliary: There are only a few tiny residual hepatic lesions. There was fairly widespread metastatic disease on the prior MRI which shows a remarkable improvement. Persistent intra and extrahepatic biliary dilatation. The biliary stent is in good position. No obvious occlusion.  The gallbladder is mildly distended.  Layering sludge is noted. Pancreas: The pancreatic head mass is not well demonstrated. This represents a significant change when compared to the prior study. A few small scattered peripancreatic lymph nodes are noted but no mass or progressive adenopathy. Spleen: Normal size. No focal lesions. The splenic and portal veins are patent. Adrenals/Urinary Tract: The adrenal glands and kidneys are unremarkable and stable. Stomach/Bowel: Visualized portions within the abdomen are unremarkable. Vascular/Lymphatic: The aorta and branch vessels are patent. The major venous structures are patent. Other:  No ascites or abdominal wall hernia. Musculoskeletal: Numerous bony lesions are noted on the T2 weighted images and some of these demonstrate mild enhancement. Findings suggest treated metastasis. Compression fracture of L2 with moderate stable spinal canal encroachment. IMPRESSION: 1. Significant interval improvement since the prior MR examination. The pancreatic head mass is difficult to identify and the hepatic metastatic disease is near completely resolved. 2. Stable intra and extrahepatic biliary dilatation. The common bile duct stent appears to be in good position without complicating features. 3. Mild distention of the gallbladder with some layering sludge. 4. Numerous metastatic bone lesions with minimal enhancement suggesting improving/treated metastatic disease. 5. Persistent right basilar airspace opacity. Electronically Signed   By: PMarijo SanesM.D.   On: 02/25/2018 20:37   [place]dom[place] Limited Ruq  Result Date: 03/01/2018 CLINICAL DATA:  Upper abdominal pain for 8 days. History of metastatic pancreatic carcinoma. EXAM: ULTRASOUND ABDOMEN LIMITED RIGHT UPPER QUADRANT COMPARISON:  MRCP 02/25/2018. FINDINGS: Gallbladder: There is some sludge and a few small stones within the gallbladder. No gallbladder wall thickening or pericholecystic fluid is identified. Sonographer reports  negative Murphy's sign. Common bile duct: Diameter: 12.2 cm with a common bile duct stent in place. Liver: Intrahepatic biliary ductal dilatation is seen as identified on the prior exam. 1.6 cm lesion in the right hepatic lobe is compatible with a metastatic deposit as seen on the prior exam. Portal vein is patent on color Doppler imaging with normal direction of blood flow towards the  liver. IMPRESSION: No acute abnormality. Small volume of gallbladder sludge with a few stones. Negative for cholecystitis. Unchanged intra and extrahepatic biliary ductal dilatation with the common bile duct stent in place. Small lesion in the right lobe of the liver compatible with metastatic disease as seen on the prior exam. Electronically Signed   By: Inge Rise M.D.   On: 03/01/2018 13:04    ASSESSMENT & PLAN:  67 y.o. female with  1. 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  Liver mets + Bone mets + adrenal mets Pancreatic mets- FNA confirmed MRI brain 10/29/2017 after this clinic visit showed concern for multiple asymptomatic subcentimeter brain mets. 11/28/17 Brain MRI which revealed stable lesions, and new nonenhancing lesions. I discussed this imaging study with Dr Ulis Rias our neuro-oncologist -- no area of critical brain mets that needs patient to rush into WBRT at this time.  12/26/17 PET which revealed Marked improvement in the previously demonstrated widespread hypermetabolic metastatic disease. There is only mild residual hypermetabolic activity within some of the osseous lesions.   patient has completed 4 cycles of carboplatin/etoposide/Atezolizumab and completed -completed WBRT 30 Gy in 10 fractions 01/15/2018 - 01/29/2018  2. Newly abnormal Liver function tests  MRCP 02/25/2018 Significant interval improvement since the prior MR examination. The  pancreatic head mass is difficult to identify and the hepatic metastatic disease is near completely resolved. Stable intra and extrahepatic biliary dilatation. The common bile duct stent appears to be in good position without complicating features.  [place] abd 02/25/2018 - no acute abnormalities.  Likely from atezolizumab -- improving Plan -will hold Atezolizumab at this time.  3. Severe generalized abdominal discomfort Lipase/Amylase WNL Clinically some signs suggest of rebound. ? Etiology. Bad enough to keep the patient from eating. AXR - no overt obstruction. No significant stools. Plan: -stat CT abd/pelvis to evaluate for worsening abdominal pain with ?rebound -consider surgery consultation for input regarding etiology of pain if not evidence of CT abd/pelvis -PPI  4.Hyponatremia - poor po intake vs SIADH resolved. Also noted to have low cortisol levels due to some adrenal suppression. PLAN -continue hydrocortisone 74m with breakfast and 537mwith lunch -- increased to stress dose steroids if needed.  5. Back pain related to pathologic L2 fracture (causing elevated alkaline phosphatase levels. -continue using back brace -continue prn Oxycodone with GI prophylaxis -will hold of on interventional procedure to L2 at this time. -Per patient's clinical presentation; her hip and leg pain is likely nerve pinching due to compression fractures.  -conitnue Oxycontin, and keeping short term Oxycodone prn. -Opiate related constipation: advised increasing Senna S to twice a day and using Miralax as well; one time OTC Magnesium Citrate if she still cannot move bowels.   5. Smoking history, productive cough  Smoked for 50 years, 1 pack a day  Plan: -Smoking cessation being followed at this time.  6. Moderate to severe protein malnutrition due to weight loss had resolved but now with poor po intake due to abdominal pain -- leading to weight loss and catabolic starte -resolved   7. Adrenal  insufficiency -Continue current dose of Hydrocortisol -Will refer pt to an endocrinologist  The total time spent in the appt was 35 minutes and more than 50% was on counseling and direct patient cares.    GaSullivan LoneD MSPaceAHIVMS SCCarlisle Endoscopy Center LtdTNewport Coast Surgery Center LPematology/Oncology Physician CoClearwater(Office):  [phone] (Work cell):  [phone] (Fax):           [phone]  03/02/2018 12:14 PM   I, Baldwin [place], am acting as a Education administrator for Dr Irene Limbo.   .I have reviewed the above documentation for accuracy and completeness, and I agree with the above. Sullivan Lone MD MS

## 2018-03-03 LAB — COMPREHENSIVE METABOLIC PANEL
ALT: 72 U/L — AB (ref 0–44)
ANION GAP: 9 (ref 5–15)
AST: 37 U/L (ref 15–41)
Albumin: 2.6 g/dL — ABNORMAL LOW (ref 3.5–5.0)
Alkaline Phosphatase: 312 U/L — ABNORMAL HIGH (ref 38–126)
BUN: 6 mg/dL — ABNORMAL LOW (ref 8–23)
CHLORIDE: 108 mmol/L (ref 98–111)
CO2: 20 mmol/L — AB (ref 22–32)
CREATININE: 0.44 mg/dL (ref 0.44–1.00)
Calcium: 7 mg/dL — ABNORMAL LOW (ref 8.9–10.3)
Glucose, Bld: 65 mg/dL — ABNORMAL LOW (ref 70–99)
POTASSIUM: 3.6 mmol/L (ref 3.5–5.1)
Sodium: 137 mmol/L (ref 135–145)
Total Bilirubin: 2.3 mg/dL — ABNORMAL HIGH (ref 0.3–1.2)
Total Protein: 5.5 g/dL — ABNORMAL LOW (ref 6.5–8.1)

## 2018-03-03 LAB — CBC
HCT: 29.5 % — ABNORMAL LOW (ref 36.0–46.0)
Hemoglobin: 9.8 g/dL — ABNORMAL LOW (ref 12.0–15.0)
MCH: 31.5 pg (ref 26.0–34.0)
MCHC: 33.2 g/dL (ref 30.0–36.0)
MCV: 94.9 fL (ref 78.0–100.0)
PLATELETS: 262 10*3/uL (ref 150–400)
RBC: 3.11 MIL/uL — ABNORMAL LOW (ref 3.87–5.11)
RDW: 16.2 % — ABNORMAL HIGH (ref 11.5–15.5)
WBC: 8.5 10*3/uL (ref 4.0–10.5)

## 2018-03-03 LAB — PTH, INTACT AND CALCIUM
Calcium, Total (PTH): 6.7 mg/dL (ref 8.7–10.3)
PTH: 241 pg/mL — AB (ref 15–65)

## 2018-03-03 LAB — URINE CULTURE: CULTURE: NO GROWTH

## 2018-03-03 LAB — VITAMIN D 25 HYDROXY (VIT D DEFICIENCY, FRACTURES): Vit D, 25-Hydroxy: 20.7 ng/mL — ABNORMAL LOW (ref 30.0–100.0)

## 2018-03-03 MED ORDER — HEPARIN SOD (PORK) LOCK FLUSH 100 UNIT/ML IV SOLN
500.0000 [IU] | Freq: Once | INTRAVENOUS | Status: DC
  Filled 2018-03-03: qty 5

## 2018-03-03 MED ORDER — DOCUSATE SODIUM 100 MG PO CAPS: 100.0000 mg | ORAL_CAPSULE | Freq: Two times a day (BID) | ORAL | 0 refills | Status: AC

## 2018-03-03 MED ORDER — LACTULOSE 10 G PO PACK: 10.0000 g | PACK | Freq: Three times a day (TID) | ORAL | 0 refills | Status: AC | PRN

## 2018-03-03 NOTE — Progress Notes (Signed)
D/c instructions review  with PT  and son. Both verbalized understanding. Pt d/c home with her son

## 2018-03-03 NOTE — Progress Notes (Signed)
HEMATOLOGY/ONCOLOGY INPATIENT PROGRESS NOTE  Date of Service: 03/03/2018  Inpatient Attending: .Donne Hazel, MD   SUBJECTIVE:   Haley [name]  is accompanied today by her son. The pt reports that she is doing well overall.   The pt reports that her abdomen is feeling much better and has been able to eat well today. She notes that she has had some loose stools today. She notes that as soon as she began moving her bowels after drinking the contrast, her abdomen began feeling much better.   She notes that her throat has continued to hurt, specifically in the morning when she wakes up. She also endorses some nasal discharge. She notes that she could be having allergies at this time.   She notes that she is out of hydrocortisone as well.   Lab results today (03/03/18) of CBC, CMP, and is as follows: all values are WNL except for RBC at 3.11, HGB at 9.8, HCT at 29.5, RDW at 16.2, CO2 at 20, Glucose at 65, BUN at 6, Calcium at 7.0, Total Protein at 5.5, Albumin at 2.6, ALT at 72, Alk Phos at 312, Total Bilirubin at 2.3. 03/02/18 Amylase was a little low at 21  On review of systems, pt reports resolved abdominal pain, eating well, moving her bowels, and denies vomiting.    OBJECTIVE:  NAD  PHYSICAL EXAMINATION: . Vitals:   03/02/18 1831 03/02/18 2052 03/03/18 0521 03/03/18 1349  BP:  115/86 121/71 (!) 142/88  Pulse:  78 66 88  Resp:  _0 Temp:  98.3 F (36.8 C) 98.3 F (36.8 C) 97.7 F (36.5 C)  TempSrc:  Oral Oral Oral  SpO2: 94% 94% 93% 94%  Weight:      Height:       Filed Weights   03/01/18 1058 03/01/18 1634  Weight: 132 lb (59.9 kg) 131 lb (59.4 kg)   .Body mass index is 23.21 kg/m.  GENERAL:alert, mild distress due to abdominal discomfort SKIN: no acute rashes, no significant lesions EYES: conjunctiva are pink and non-injected, mild sclera icterus OROPHARYNX: MMM, no exudates, no oropharyngeal erythema or ulceration NECK: supple, no JVD LYMPH:  no  palpable lymphadenopathy in the cervical, axillary or inguinal regions LUNGS: clear to auscultation b/l with normal respiratory effort HEART: regular rate & rhythm ABDOMEN: BS+ no significant TTP, no guarding/rigidity/rebound. Extremity: no pedal edema PSYCH: alert & oriented x 3 with fluent speech NEURO: no focal motor/sensory deficits    MEDICAL HISTORY:  Past Medical History:  Diagnosis Date  . Allergy   . Back pain   . Cancer ([place])   . Osteopenia     SURGICAL HISTORY: Past Surgical History:  Procedure Laterality Date  . COLONOSCOPY    . COLONOSCOPY WITH PROPOFOL N/A 10/25/2014   Procedure: COLONOSCOPY WITH PROPOFOL;  Surgeon: Garlan Fair, MD;  Location: [facility];  Service: Endoscopy;  Laterality: N/A;  . ERCP N/A 10/15/2017   Procedure: ENDOSCOPIC RETROGRADE CHOLANGIOPANCREATOGRAPHY (ERCP);  Surgeon: Clarene Essex, MD;  Location: [facility];  Service: Endoscopy;  Laterality: N/A;  . IR FLUORO GUIDE PORT INSERTION RIGHT  11/10/2017  . IR RADIOLOGIST EVAL & MGMT  10/02/2017  . IR US GUIDE VASC ACCESS RIGHT  11/10/2017    SOCIAL HISTORY: Social History   Socioeconomic History  . Marital status: Single    Spouse name: Not on file  . Number of children: Not on file  . Years of education: Not on file  . Highest education level:  Not on file  Occupational History  . Not on file  Social Needs  . Financial resource strain: Not on file  . Food insecurity:    Worry: Not on file    Inability: Not on file  . Transportation needs:    Medical: Not on file    Non-medical: Not on file  Tobacco Use  . Smoking status: Former Smoker    Packs/day: 1.00    Years: 40.00    Pack years: 40.00    Types: Cigarettes    Last attempt to quit: 09/24/2017    Years since quitting: 0.4  . Smokeless tobacco: Never Used  Substance and Sexual Activity  . Alcohol use: Yes    Comment: rare social  . Drug use: No  . Sexual activity: Not Currently  Lifestyle  . Physical activity:    Days  per week: Not on file    Minutes per session: Not on file  . Stress: Not on file  Relationships  . Social connections:    Talks on phone: Not on file    Gets together: Not on file    Attends religious service: Not on file    Active member of club or organization: Not on file    Attends meetings of clubs or organizations: Not on file    Relationship status: Not on file  . Intimate partner violence:    Fear of current or ex partner: Not on file    Emotionally abused: Not on file    Physically abused: Not on file    Forced sexual activity: Not on file  Other Topics Concern  . Not on file  Social History Narrative  . Not on file    FAMILY HISTORY: Family History  Problem Relation Age of Onset  . Diabetes Mother   . Hypertension Mother   . Diabetes Sister   . Hypertension Sister   . Hyperlipidemia Sister     ALLERGIES:  has No Known Allergies.  MEDICATIONS:  Scheduled Meds: . enoxaparin (LOVENOX) injection  40 mg Subcutaneous Q24H  . feeding supplement  1 Container Oral TID BM  . heparin lock flush  500 Units Intravenous Once  . hydrocortisone  10 mg Oral Q breakfast  . hydrocortisone  5 mg Oral Q lunch  . oxyCODONE  15 mg Oral Q12H  . senna-docusate  2 tablet Oral QHS   Continuous Infusions: . 0.9 % NaCl with KCl 40 mEq / L 50 mL/hr (03/03/18 1527)  . famotidine (PEPCID) IV Stopped (03/03/18 0930)   PRN Meds:.acetaminophen **OR** acetaminophen, gi cocktail, iopamidol, ketorolac, magic mouthwash w/lidocaine, ondansetron **OR** ondansetron (ZOFRAN) IV, polyethylene glycol  REVIEW OF SYSTEMS:    A 10+ POINT REVIEW OF SYSTEMS WAS OBTAINED including neurology, dermatology, psychiatry, cardiac, respiratory, lymph, extremities, GI, GU, Musculoskeletal, constitutional, breasts, reproductive, HEENT.  All pertinent positives are noted in the HPI.  All others are negative.    LABORATORY DATA:  I have reviewed the data as listed  CBC Latest Ref Rng & Units 03/03/2018 03/02/2018  03/01/2018  WBC 4.0 - 10.5 K/uL 8.5 12.2(H) 3.9(L)  Hemoglobin 12.0 - 15.0 g/dL 9.8(L) 9.8(L) 12.6  Hematocrit 36.0 - 46.0 % 29.5(L) 29.3(L) 37.7  Platelets 150 - 400 K/uL 262 224 289    CBC    Component Value Date/Time   WBC 8.5 03/03/2018 0549   RBC 3.11 (L) 03/03/2018 0549   HGB 9.8 (L) 03/03/2018 0549   HGB 10.9 (L) 12/29/2017 0815   HCT 29.5 (L) 03/03/2018 3790  PLT 262 03/03/2018 0549   PLT 237 12/29/2017 0815   MCV 94.9 03/03/2018 0549   MCH 31.5 03/03/2018 0549   MCHC 33.2 03/03/2018 0549   RDW 16.2 (H) 03/03/2018 0549   LYMPHSABS 0.6 (L) 02/24/2018 1949   MONOABS 0.7 02/24/2018 1949   EOSABS 0.0 02/24/2018 1949   BASOSABS 0.0 02/24/2018 1949    . CMP Latest Ref Rng & Units 03/03/2018 03/02/2018 03/02/2018  Glucose 70 - 99 mg/dL 65(L) 79 -  BUN 8 - 23 mg/dL 6(L) 12 -  Creatinine 0.44 - 1.00 mg/dL 0.44 0.40(L) -  Sodium 135 - 145 mmol/L 137 137 -  Potassium 3.5 - 5.1 mmol/L 3.6 4.2 -  Chloride 98 - 111 mmol/L 108 110 -  CO2 22 - 32 mmol/L 20(L) 22 -  Calcium 8.9 - 10.3 mg/dL 7.0(L) 6.7 6.1(LL)  Total Protein 6.5 - 8.1 g/dL 5.5(L) 5.1(L) -  Total Bilirubin 0.3 - 1.2 mg/dL 2.3(H) 3.8(H) -  Alkaline Phos 38 - 126 U/L 312(H) 282(H) -  AST 15 - 41 U/L 37 61(H) -  ALT 0 - 44 U/L 72(H) 92(H) -    RADIOGRAPHIC STUDIES: I have personally reviewed the radiological images as listed and agreed with the findings in the report. Ct Head Wo Contrast  Result Date: 02/24/2018 CLINICAL DATA:  67 year old female with acute headache and vomiting. Known metastatic lung cancer. EXAM: CT HEAD WITHOUT CONTRAST TECHNIQUE: Contiguous axial images were obtained from the base of the skull through the vertex without intravenous contrast. COMPARISON:  01/12/2018 MR and prior studies FINDINGS: Brain: Small cerebral metastases identified on recent MR are difficult to visualize on CT today. No discrete mass or mass effect, infarct, hemorrhage, midline shift, extra-axial collection or hydrocephalus  noted. Mild chronic small-vessel white matter ischemic changes noted. Vascular: Mild intracranial atherosclerotic calcifications noted. Skull: Normal. Negative for fracture or focal lesion. Sinuses/Orbits: Fluid within the LEFT sphenoid sinus again noted. Other: None. IMPRESSION: 1. No evidence of acute intracranial abnormality. Small cerebral metastases identified on 01/12/2018 MR are not well visualized by CT. 2. Mild chronic small-vessel white matter ischemic changes. Electronically Signed   By: Margarette Canada M.D.   On: 02/24/2018 21:28   Ct Abdomen Pelvis W Contrast  Result Date: 03/02/2018 CLINICAL DATA:  Abdominal pain, history of small cell lung cancer. EXAM: CT ABDOMEN AND PELVIS WITH CONTRAST TECHNIQUE: Multidetector CT imaging of the abdomen and pelvis was performed using the standard protocol following bolus administration of intravenous contrast. CONTRAST:  176m ISOVUE-300 IOPAMIDOL (ISOVUE-300) INJECTION 61%, 115mISOVUE-300 IOPAMIDOL (ISOVUE-300) INJECTION 61% COMPARISON:  12/26/2017 FINDINGS: Lower chest: Small bilateral pleural effusions are again noted. Right lower lobe lung mass with postobstructive atelectasis and consolidation is again identified.Superimposed interstitial thickening noted which may reflect pulmonary edema. Hepatobiliary: There is no acute liver findings. A stent is identified within the common bile duct. Pneumobilia is identified reflecting biliary patency. Liver metastasis within the posterior right lobe measures 1 cm, image 28/2. Unchanged from previous exam. Segment 4b liver metastasis measures 1.1 cm, image 39/2. Previously 1 cm. Diffuse periportal edema is identified. Mild gallbladder wall edema noted. No gallstones or pericholecystic fluid identified. Pancreas: Unremarkable appearance of the pancreas. Pancreatic head mass is no longer measurable. Spleen: Spleen normal. Adrenals/Urinary Tract: Unremarkable appearance of the adrenal glands. Kidneys are normal. Urinary  bladder appears within normal limits. Stomach/Bowel: Stomach normal. The small bowel loops have a normal course and caliber without obstruction. The appendix is visualized and appears normal. The colon is negative. Vascular/Lymphatic:  Aortic atherosclerosis without aneurysm. No upper abdominal adenopathy. No pelvic or inguinal adenopathy. Reproductive: Uterus and bilateral adnexa are unremarkable. Other: There is a small volume of free fluid identified within the pelvis. New from previous PET-CT dated 12/26/2017. Musculoskeletal: Widespread sclerotic bone metastases are identified. Again noted is a vertically oriented pathologic fracture involving the L2 vertebral body with retropulsion and spinal canal encroachment as demonstrated previously, image 63/6. IMPRESSION: 1. No specific findings identified to explain patient's acute abdominal pain. 2. Bilateral pleural effusions and mild interstitial edema may reflect CHF. 3. Right hilar lung mass with post obstructive pneumonitis of the right lower lobe is again noted. 4. Biliary stent remains in place with pneumobilia reflecting biliary patency. 5. Liver metastasis as described on 02/25/2018. 6. New small volume of free fluid within the pelvis.  Nonspecific. 7. Widespread bone metastasis with pathologic fracture involving the L2 vertebral body as noted previously. Electronically Signed   By: Kerby Moors M.D.   On: 03/02/2018 17:01   Mr 3d Recon At Scanner  Result Date: 03/02/2018 CLINICAL DATA:  Nonspecific (abnormal) findings on radiological and other examination of musculoskeletal sysem. EXAM: 3-DIMENSIONAL MR IMAGE RENDERING ON AQUISITION WORKSTATION TECHNIQUE: 3-dimensional MR images were rendered by post-processing of the original MR data on an aquisition workstation. The 3-dimensional MR images were interpreted and findings were reported in the accompanying complete MR report for this study. Please see accession number [id number] for Dr. Kathie Dike  report on this case. Electronically Signed   By: Van Clines M.D.   On: 03/02/2018 06:40   Dg Chest Portable 1 View  Result Date: 02/24/2018 CLINICAL DATA:  Acute shortness of breath and fatigue. EXAM: PORTABLE CHEST 1 VIEW COMPARISON:  12/26/2017 PET CT, 12/19/2017 chest radiograph and priors FINDINGS: The cardiomediastinal silhouette is unremarkable. A RIGHT Port-A-Cath is again noted with tip overlying the mid SVC. RIGHT basilar atelectasis/scarring again noted. There is no evidence of focal airspace disease, pulmonary edema, suspicious pulmonary nodule/mass, pleural effusion, or pneumothorax. No acute bony abnormalities are identified. IMPRESSION: No evidence of acute cardiopulmonary disease. Electronically Signed   By: Margarette Canada M.D.   On: 02/24/2018 19:57   Dg Abdomen Acute W/chest  Result Date: 03/01/2018 CLINICAL DATA:  Weakness, nausea, vomiting, small cell lung cancer EXAM: DG ABDOMEN ACUTE W/ 1V CHEST COMPARISON:  02/24/2018 chest radiograph. Abdominal sonogram from earlier today. 12/26/2017 PET-CT. 02/25/2018 MRI abdomen. FINDINGS: Right internal jugular MediPort terminates in the lower third of the SVC. Stable cardiomediastinal silhouette with normal heart size. No pneumothorax. Stable slight blunting of the costophrenic angles bilaterally. No pulmonary edema. Mild streaky left lung base opacity, new. No acute consolidative airspace disease. CBD stent is in the expected location in the medial right upper abdomen. No dilated small bowel loops or air-fluid levels. Minimal gas and stool in the large bowel. No evidence of pneumatosis or pneumoperitoneum. No radiopaque nephrolithiasis. IMPRESSION: 1. Mild streaky left lung base opacity, favor mild atelectasis. 2. Stable slight blunting of the bilateral costophrenic angles, favor minimal pleural-parenchymal scarring, cannot exclude trace effusions. 3. Nonobstructive bowel gas pattern.  CBD stent in place. Electronically Signed   By: Ilona Sorrel M.D.   On: 03/01/2018 15:17   Mr Abdomen Mrcp Moise Boring Contast  Result Date: 02/25/2018 CLINICAL DATA:  Metastatic pancreatic cancer with biliary stent. Elevated liver function studies. EXAM: MRI ABDOMEN WITHOUT AND WITH CONTRAST (INCLUDING MRCP) TECHNIQUE: Multiplanar multisequence MR imaging of the abdomen was performed both before and after the administration of intravenous contrast. Heavily  T2-weighted images of the biliary and pancreatic ducts were obtained, and three-dimensional MRCP images were rendered by post processing. CONTRAST:  30m MULTIHANCE GADOBENATE DIMEGLUMINE 529 MG/ML IV SOLN COMPARISON:  MRI abdomen 10/15/2017 and PET-CT 12/26/2017 FINDINGS: Lower chest: Persistent right basilar airspace consolidation. No pleural effusions. No pericardial effusion. No obvious pulmonary lesions. Hepatobiliary: There are only a few tiny residual hepatic lesions. There was fairly widespread metastatic disease on the prior MRI which shows a remarkable improvement. Persistent intra and extrahepatic biliary dilatation. The biliary stent is in good position. No obvious occlusion. The gallbladder is mildly distended.  Layering sludge is noted. Pancreas: The pancreatic head mass is not well demonstrated. This represents a significant change when compared to the prior study. A few small scattered peripancreatic lymph nodes are noted but no mass or progressive adenopathy. Spleen: Normal size. No focal lesions. The splenic and portal veins are patent. Adrenals/Urinary Tract: The adrenal glands and kidneys are unremarkable and stable. Stomach/Bowel: Visualized portions within the abdomen are unremarkable. Vascular/Lymphatic: The aorta and branch vessels are patent. The major venous structures are patent. Other:  No ascites or abdominal wall hernia. Musculoskeletal: Numerous bony lesions are noted on the T2 weighted images and some of these demonstrate mild enhancement. Findings suggest treated metastasis. Compression  fracture of L2 with moderate stable spinal canal encroachment. IMPRESSION: 1. Significant interval improvement since the prior MR examination. The pancreatic head mass is difficult to identify and the hepatic metastatic disease is near completely resolved. 2. Stable intra and extrahepatic biliary dilatation. The common bile duct stent appears to be in good position without complicating features. 3. Mild distention of the gallbladder with some layering sludge. 4. Numerous metastatic bone lesions with minimal enhancement suggesting improving/treated metastatic disease. 5. Persistent right basilar airspace opacity. Electronically Signed   By: PMarijo SanesM.D.   On: 02/25/2018 20:37   [place]domen Limited Ruq  Result Date: 03/01/2018 CLINICAL DATA:  Upper abdominal pain for 8 days. History of metastatic pancreatic carcinoma. EXAM: ULTRASOUND ABDOMEN LIMITED RIGHT UPPER QUADRANT COMPARISON:  MRCP 02/25/2018. FINDINGS: Gallbladder: There is some sludge and a few small stones within the gallbladder. No gallbladder wall thickening or pericholecystic fluid is identified. Sonographer reports negative Murphy's sign. Common bile duct: Diameter: 12.2 cm with a common bile duct stent in place. Liver: Intrahepatic biliary ductal dilatation is seen as identified on the prior exam. 1.6 cm lesion in the right hepatic lobe is compatible with a metastatic deposit as seen on the prior exam. Portal vein is patent on color Doppler imaging with normal direction of blood flow towards the liver. IMPRESSION: No acute abnormality. Small volume of gallbladder sludge with a few stones. Negative for cholecystitis. Unchanged intra and extrahepatic biliary ductal dilatation with the common bile duct stent in place. Small lesion in the right lobe of the liver compatible with metastatic disease as seen on the prior exam. Electronically Signed   By: TInge RiseM.D.   On: 03/01/2018 13:04    ASSESSMENT & PLAN:   67y.o. female with  1.  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  Liver mets + Bone mets + adrenal mets Pancreatic mets- FNA confirmed MRI brain 10/29/2017 after this clinic visit showed concern for multiple asymptomatic subcentimeter brain mets. 11/28/17 Brain MRI which revealed stable lesions, and new nonenhancing lesions. I discussed this imaging study with Dr VUlis Riasour  neuro-oncologist -- no area of critical brain mets that needs patient to rush into WBRT at this time.  12/26/17 PET which revealed Marked improvement in the previously demonstrated widespread hypermetabolic metastatic disease. There is only mild residual hypermetabolic activity within some of the osseous lesions.   patient has completed 4 cycles of carboplatin/etoposide/Atezolizumab and completed -completed WBRT 30 Gy in 10 fractions 01/15/2018 - 01/29/2018  2. Newly abnormal Liver function tests  MRCP 02/25/2018 Significant interval improvement since the prior MR examination. The pancreatic head mass is difficult to identify and the hepatic metastatic disease is near completely resolved. Stable intra and extrahepatic biliary dilatation. The common bile duct stent appears to be in good position without complicating features.  [place] abd 02/25/2018 - no acute abnormalities.  Likely from atezolizumab -- improving Plan -will hold Atezolizumab at this time. -optimize po intake  3. Severe generalized abdominal discomfort Lipase/Amylase WNL AXR - no overt obstruction. No significant stools. Plan: -PPI -Discussed pt labwork today, 03/03/18; Calcium improved to 7.0, ALT improved to 72, Total Bilirubin decreased to 2.3 -Will hold immunotherapy and will see pt back in clinic in 10 days -Stressed and emphasized that the pt should continue to eat well  -From my perspective, pt is clear for discharge  CT  abd/pelvis done 03/02/2018 - showed no evidence of acute new intra abdominal pathology.  4.Hyponatremia - poor po intake vs SIADH resolved. Also noted to have low cortisol levels due to some adrenal suppression. PLAN -continue hydrocortisone 31m with breakfast and 548mwith lunch -- increased to stress dose steroids if needed. -prescription sent to her pharmacy  5. Adrenal insufficiency -Continue current dose of Hydrocortisol -referred to endocrinologist  The total time spent in the appt was 25 minutes and more than 50% was on counseling and direct patient cares.    GaSullivan LoneD MS AAHIVMS SCBurlingame Health Care Center D/P SnfTMercy Hospital Kingfisherematology/Oncology Physician CoMarshall County Hospital(Office):       [phone] cell):  [phone]):           [phone]/04/2018 4:40 PM   I, ScBaldwin Jamaicaam acting as a scEducation administratoror Dr KaIrene Limbo  .I have reviewed the above documentation for accuracy and completeness, and I agree with the above. GaSullivan LoneD MS

## 2018-03-03 NOTE — Discharge Summary (Signed)
Physician Discharge Summary  Haley [name] PXT:[id number] DOB: 1951-02-09 DOA: 03/01/2018  PCP: Alycia Rossetti, MD  Admit date: 03/01/2018 Discharge date: 03/03/2018  Admitted From: Home Disposition:  Home  Recommendations for Outpatient Follow-up:  1. Follow up with PCP in 1-2 weeks 2. Follow up with Oncology as scheduled 3. Recommend repeat comprehensive metabolic panel in 1-2 weeks 4. Please ensure regular bowel movements  Discharge Condition:Improved CODE STATUS:Full Diet recommendation: Regular   Brief/Interim Summary: 67 y.o.femalewith medical history significant ofsmall cell lung cancer with liver/bone/pancreatic/adrenal metastases currently on chemotherapy, obstructive jaundice due to biliary compression from pancreatic mass status post ERCP and [place], history of tobacco abuse, back pain related to pathologic L2 fracture presented with nausea, vomiting and upper abdominal discomfort ongoing for the last more than a week. She has had a very poor appetite with extreme fatigue and is unable to keep fluids down over the past several days. She was seen in the emergency department on 02/24/2018 and had elevated LFTs, was discharged home but was subsequently seen again on 02/25/2018 and had an MRCP done which showed that her CBD stent was in good position and her LFTs were improving, she was sent home after IV fluids after discussion with GI/Dr. Penelope Coop and oncology.Patient has not been able to tolerate any food/liquid since then. No fever, cough, shortness of breath, chest pain, diarrhea, dysuria, hematuria, loss of consciousness or seizure. She states that her upper abdomen hurts intermittently, up to 6-7 out of 10 in intensity, with no radiation or aggravating or relieving factors.  ED Course:She was given IV fluids. Ultrasound of her right upper quadrant showed no signs of cholecystitis. Patient felt extremely weak even after IV fluids. Hospitalist service  was called to evaluate the patient.  Dehydration -Most likely secondary to presenting n/v -Improved with IVF hydration  Nausea, vomiting and abdominal pain -Uncertain etiology. Recent MRI of the abdomen showed stable CBD stent stable intrahepatic and extrahepatic biliary dilatation -Patient has cholelithiasis but no evidence of cholecystitis. -Currently on clear liquid diet -PRN GI cocktail -CT abd ordered per Oncology. Known liver mets seen with L2 vertebral body pathologic fracture noted. Otherwise, no specific findings identified to explain current acute abd pain. Reviewed personally -resolved after soap suds enema overnight. Patient now on regular diet, tolerating well. -Presenting symptoms resolved -Recommend scheduled stool softeners and PRN lactulose for constipation in setting of chronic narcotics   Elevated LFTs -Questionable cause.  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 Irene Limbo -Patient has been seen by Dr. Irene Limbo  Hypokalemia -Replaced.  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   docusate sodium 100 MG capsule Commonly known as:  COLACE Take 1  capsule (  100 mg total) by mouth 2 (two) times daily.   hydrocortisone 10 MG tablet Commonly known as:  CORTEF Take 1 tablet (10 mg total) by mouth daily with breakfast. And 5mg  po daily with lunch. What changed:    how much to take  when to take this  additional instructions   lactulose 10 g packet Commonly known as:  CEPHULAC Take 1 packet (10 g total) by mouth 3 (three) times daily as needed (constipation).   lidocaine-prilocaine cream Commonly known as:  EMLA Apply to affected area once   LORazepam 0.5 MG tablet Commonly known as:  ATIVAN Take 1 tablet (0.5 mg total) by mouth every 6 (six) hours as needed (Nausea or vomiting).   magic mouthwash w/lidocaine Soln Take 10 mLs by mouth 4 (four) times daily as needed for mouth pain. Rinse and spit or swallow   multivitamin with minerals tablet Take 1 tablet by mouth daily.   ondansetron 4 MG tablet Commonly known as:  ZOFRAN Take 1 tablet (4 mg total) by mouth every 8 (eight) hours as needed for nausea or vomiting.   oxyCODONE 15 mg 12 hr tablet Commonly known as:  OXYCONTIN Take 1 tablet (15 mg total) by mouth every 12 (twelve) hours.   polyethylene glycol packet Commonly known as:  MIRALAX Take 17 g by mouth daily as needed for moderate constipation.   prochlorperazine 10 MG tablet Commonly known as:  COMPAZINE Take 1 tablet (10 mg total) by mouth every 6 (six) hours as needed (Nausea or vomiting).   senna-docusate 8.6-50 MG tablet Commonly known as:  SENNA S Take 2 tablets by mouth at bedtime. What changed:    when to take this  reasons to take this   sodium chloride 1 g tablet Take 1 tablet (1 g total) by mouth 3 (three) times daily with meals.       No Known Allergies  Consultations:  Oncology  Procedures/Studies: Ct Head Wo Contrast  Result Date: 02/24/2018 CLINICAL DATA:  67 year old female with acute headache and vomiting. Known metastatic lung cancer. EXAM: CT HEAD WITHOUT CONTRAST  TECHNIQUE: Contiguous axial images were obtained from the base of the skull through the vertex without intravenous contrast. COMPARISON:  01/12/2018 MR and prior studies FINDINGS: Brain: Small cerebral metastases identified on recent MR are difficult to visualize on CT today. No discrete mass or mass effect, infarct, hemorrhage, midline shift, extra-axial collection or hydrocephalus noted. Mild chronic small-vessel white matter ischemic changes noted. Vascular: Mild intracranial atherosclerotic calcifications noted. Skull: Normal. Negative for fracture or focal lesion. Sinuses/Orbits: Fluid within the LEFT sphenoid sinus again noted. Other: None. IMPRESSION: 1. No evidence of acute intracranial abnormality. Small cerebral metastases identified on 01/12/2018 MR are not well visualized by CT. 2. Mild chronic small-vessel white matter ischemic changes. Electronically Signed   By: Margarette Canada M.D.   On: 02/24/2018 21:28   Ct Abdomen Pelvis W Contrast  Result Date: 03/02/2018 CLINICAL DATA:  Abdominal pain, history of small cell lung cancer. EXAM: CT ABDOMEN AND PELVIS WITH CONTRAST TECHNIQUE: Multidetector CT imaging of the abdomen and pelvis was performed using the standard protocol following bolus administration of intravenous contrast. CONTRAST:  134mL ISOVUE-300 IOPAMIDOL (ISOVUE-300) INJECTION 61%, 62mL ISOVUE-300 IOPAMIDOL (ISOVUE-300) INJECTION 61% COMPARISON:  12/26/2017 FINDINGS: Lower chest: Small bilateral pleural effusions are again noted. Right lower lobe lung mass with postobstructive atelectasis and consolidation is again identified.Superimposed interstitial thickening noted which may reflect pulmonary edema. Hepatobiliary: There is no acute liver findings. A stent is identified  within the common bile duct. Pneumobilia is identified reflecting biliary patency. Liver metastasis within the posterior right lobe measures 1 cm, image 28/2. Unchanged from previous exam. Segment 4b liver metastasis measures  1.1 cm, image 39/2. Previously 1 cm. Diffuse periportal edema is identified. Mild gallbladder wall edema noted. No gallstones or pericholecystic fluid identified. Pancreas: Unremarkable appearance of the pancreas. Pancreatic head mass is no longer measurable. Spleen: Spleen normal. Adrenals/Urinary Tract: Unremarkable appearance of the adrenal glands. Kidneys are normal. Urinary bladder appears within normal limits. Stomach/Bowel: Stomach normal. The small bowel loops have a normal course and caliber without obstruction. The appendix is visualized and appears normal. The colon is negative. Vascular/Lymphatic: Aortic atherosclerosis without aneurysm. No upper abdominal adenopathy. No pelvic or inguinal adenopathy. Reproductive: Uterus and bilateral adnexa are unremarkable. Other: There is a small volume of free fluid identified within the pelvis. New from previous PET-CT dated 12/26/2017. Musculoskeletal: Widespread sclerotic bone metastases are identified. Again noted is a vertically oriented pathologic fracture involving the L2 vertebral body with retropulsion and spinal canal encroachment as demonstrated previously, image 63/6. IMPRESSION: 1. No specific findings identified to explain patient's acute abdominal pain. 2. Bilateral pleural effusions and mild interstitial edema may reflect CHF. 3. Right hilar lung mass with post obstructive pneumonitis of the right lower lobe is again noted. 4. Biliary stent remains in place with pneumobilia reflecting biliary patency. 5. Liver metastasis as described on 02/25/2018. 6. New small volume of free fluid within the pelvis.  Nonspecific. 7. Widespread bone metastasis with pathologic fracture involving the L2 vertebral body as noted previously. Electronically Signed   By: Kerby Moors M.D.   On: 03/02/2018 17:01   Mr 3d Recon At Scanner  Result Date: 03/02/2018 CLINICAL DATA:  Nonspecific (abnormal) findings on radiological and other examination of musculoskeletal  sysem. EXAM: 3-DIMENSIONAL MR IMAGE RENDERING ON AQUISITION WORKSTATION TECHNIQUE: 3-dimensional MR images were rendered by post-processing of the original MR data on an aquisition workstation. The 3-dimensional MR images were interpreted and findings were reported in the accompanying complete MR report for this study. Please see accession number [id number] for Dr. Kathie Dike report on this case. Electronically Signed   By: Van Clines M.D.   On: 03/02/2018 06:40   Dg Chest Portable 1 View  Result Date: 02/24/2018 CLINICAL DATA:  Acute shortness of breath and fatigue. EXAM: PORTABLE CHEST 1 VIEW COMPARISON:  12/26/2017 PET CT, 12/19/2017 chest radiograph and priors FINDINGS: The cardiomediastinal silhouette is unremarkable. A RIGHT Port-A-Cath is again noted with tip overlying the mid SVC. RIGHT basilar atelectasis/scarring again noted. There is no evidence of focal airspace disease, pulmonary edema, suspicious pulmonary nodule/mass, pleural effusion, or pneumothorax. No acute bony abnormalities are identified. IMPRESSION: No evidence of acute cardiopulmonary disease. Electronically Signed   By: Margarette Canada M.D.   On: 02/24/2018 19:57   Dg Abdomen Acute W/chest  Result Date: 03/01/2018 CLINICAL DATA:  Weakness, nausea, vomiting, small cell lung cancer EXAM: DG ABDOMEN ACUTE W/ 1V CHEST COMPARISON:  02/24/2018 chest radiograph. Abdominal sonogram from earlier today. 12/26/2017 PET-CT. 02/25/2018 MRI abdomen. FINDINGS: Right internal jugular MediPort terminates in the lower third of the SVC. Stable cardiomediastinal silhouette with normal heart size. No pneumothorax. Stable slight blunting of the costophrenic angles bilaterally. No pulmonary edema. Mild streaky left lung base opacity, new. No acute consolidative airspace disease. CBD stent is in the expected location in the medial right upper abdomen. No dilated small bowel loops or air-fluid levels. Minimal gas and stool in the large bowel.  No  evidence of pneumatosis or pneumoperitoneum. No radiopaque nephrolithiasis. IMPRESSION: 1. Mild streaky left lung base opacity, favor mild atelectasis. 2. Stable slight blunting of the bilateral costophrenic angles, favor minimal pleural-parenchymal scarring, cannot exclude trace effusions. 3. Nonobstructive bowel gas pattern.  CBD stent in place. Electronically Signed   By: Ilona Sorrel M.D.   On: 03/01/2018 15:17   Mr Abdomen Mrcp Moise Boring Contast  Result Date: 02/25/2018 CLINICAL DATA:  Metastatic pancreatic cancer with biliary stent. Elevated liver function studies. EXAM: MRI ABDOMEN WITHOUT AND WITH CONTRAST (INCLUDING MRCP) TECHNIQUE: Multiplanar multisequence MR imaging of the abdomen was performed both before and after the administration of intravenous contrast. Heavily T2-weighted images of the biliary and pancreatic ducts were obtained, and three-dimensional MRCP images were rendered by post processing. CONTRAST:  51mL MULTIHANCE GADOBENATE DIMEGLUMINE 529 MG/ML IV SOLN COMPARISON:  MRI abdomen 10/15/2017 and PET-CT 12/26/2017 FINDINGS: Lower chest: Persistent right basilar airspace consolidation. No pleural effusions. No pericardial effusion. No obvious pulmonary lesions. Hepatobiliary: There are only a few tiny residual hepatic lesions. There was fairly widespread metastatic disease on the prior MRI which shows a remarkable improvement. Persistent intra and extrahepatic biliary dilatation. The biliary stent is in good position. No obvious occlusion. The gallbladder is mildly distended.  Layering sludge is noted. Pancreas: The pancreatic head mass is not well demonstrated. This represents a significant change when compared to the prior study. A few small scattered peripancreatic lymph nodes are noted but no mass or progressive adenopathy. Spleen: Normal size. No focal lesions. The splenic and portal veins are patent. Adrenals/Urinary Tract: The adrenal glands and kidneys are unremarkable and stable.  Stomach/Bowel: Visualized portions within the abdomen are unremarkable. Vascular/Lymphatic: The aorta and branch vessels are patent. The major venous structures are patent. Other:  No ascites or abdominal wall hernia. Musculoskeletal: Numerous bony lesions are noted on the T2 weighted images and some of these demonstrate mild enhancement. Findings suggest treated metastasis. Compression fracture of L2 with moderate stable spinal canal encroachment. IMPRESSION: 1. Significant interval improvement since the prior MR examination. The pancreatic head mass is difficult to identify and the hepatic metastatic disease is near completely resolved. 2. Stable intra and extrahepatic biliary dilatation. The common bile duct stent appears to be in good position without complicating features. 3. Mild distention of the gallbladder with some layering sludge. 4. Numerous metastatic bone lesions with minimal enhancement suggesting improving/treated metastatic disease. 5. Persistent right basilar airspace opacity. Electronically Signed   By: Marijo Sanes M.D.   On: 02/25/2018 20:37   US Abdomen Limited Ruq  Result Date: 03/01/2018 CLINICAL DATA:  Upper abdominal pain for 8 days. History of metastatic pancreatic carcinoma. EXAM: ULTRASOUND ABDOMEN LIMITED RIGHT UPPER QUADRANT COMPARISON:  MRCP 02/25/2018. FINDINGS: Gallbladder: There is some sludge and a few small stones within the gallbladder. No gallbladder wall thickening or pericholecystic fluid is identified. Sonographer reports negative Murphy's sign. Common bile duct: Diameter: 12.2 cm with a common bile duct stent in place. Liver: Intrahepatic biliary ductal dilatation is seen as identified on the prior exam. 1.6 cm lesion in the right hepatic lobe is compatible with a metastatic deposit as seen on the prior exam. Portal vein is patent on color Doppler imaging with normal direction of blood flow towards the liver. IMPRESSION: No acute abnormality. Small volume of gallbladder  sludge with a few stones. Negative for cholecystitis. Unchanged intra and extrahepatic biliary ductal dilatation with the common bile duct stent in place. Small lesion in the right lobe of  the liver compatible with metastatic disease as seen on the prior exam. Electronically Signed   By: Inge Rise M.D.   On: 03/01/2018 13:04    Subjective: Feels much better today, tolerating diet. Eager to go home  Discharge Exam: Vitals:   03/03/18 0521 03/03/18 1349  BP: 121/71 (!) 142/88  Pulse: 66 88  Resp: 20 18  Temp: 98.3 F (36.8 C) 97.7 F (36.5 C)  SpO2: 93% 94%   Vitals:   03/02/18 1831 03/02/18 2052 03/03/18 0521 03/03/18 1349  BP:  115/86 121/71 (!) 142/88  Pulse:  78 66 88  Resp:  20 20 18   Temp:  98.3 F (36.8 C) 98.3 F (36.8 C) 97.7 F (36.5 C)  TempSrc:  Oral Oral Oral  SpO2: 94% 94% 93% 94%  Weight:      Height:        General: Pt is alert, awake, not in acute distress Cardiovascular: RRR, S1/S2 +, no rubs, no gallops Respiratory: CTA bilaterally, no wheezing, no rhonchi Abdominal: Soft, NT, ND, bowel sounds + Extremities: no edema, no cyanosis   The results of significant diagnostics from this hospitalization (including imaging, microbiology, ancillary and laboratory) are listed below for reference.     Microbiology: Recent Results (from the past 240 hour(s))  Culture, blood (routine x 2)     Status: None (Preliminary result)   Collection Time: 03/01/18  4:48 PM  Result Value Ref Range Status   Specimen Description   Final    BLOOD BLOOD LEFT ARM Performed at [facility], [place] [address] [place], [place] [address]    Special Requests   Final    BOTTLES DRAWN AEROBIC AND ANAEROBIC Blood Culture results may not be optimal due to an inadequate volume of blood received in culture bottles Performed at [place] [address] [place], [place] [address]    Culture   Final    NO GROWTH 1 DAY Performed at [facility], [place] [address], [place], [place] [address]    Report Status PENDING  Incomplete  Culture, blood (routine x 2)     Status: None (Preliminary result)   Collection Time: 03/01/18  4:54 PM  Result Value Ref Range Status   Specimen Description   Final    BLOOD RIGHT ANTECUBITAL Performed at [place] [address] [place], [place] [address]    Special Requests   Final    BOTTLES DRAWN AEROBIC AND ANAEROBIC Blood Culture adequate volume Performed at [place] [address], [place], [place] [address]    Culture   Final    NO GROWTH 1 DAY Performed at [facility], [place] [address] [place], [place] [address]    Report Status PENDING  Incomplete  Culture, Urine     Status: None   Collection Time: 03/01/18  7:51 PM  Result Value Ref Range Status   Specimen Description   Final    URINE, CLEAN CATCH Performed at [facility], [place] [address] [place], [place] [address]    Special Requests   Final    NONE Performed at [facility], [place] [address] [place], [place] [address]    Culture   Final    NO GROWTH Performed at [facility], [place] [address], [place], [place] [address]    Report Status 03/03/2018 FINAL  Final     Labs: BNP (last 3 results) No results for  input(s): BNP in the last 8760 hours. Basic Metabolic Panel: Recent Labs  Lab 02/24/18 1949 02/25/18 1335 03/01/18 1219 03/02/18 0501 03/02/18 1125 03/03/18 0549  NA 127* 132* 135 137  --  137  K 3.9 3.6 3.4* 4.2  --  3.6  CL 95* 102 100 110  --  108  CO2 21* 22 23 22   --  20*  GLUCOSE 142* 113* 150* 79  --  65*  BUN 9 5* 13 12  --  6*  CREATININE 0.44 0.35* 0.51 0.40*  --  0.44  CALCIUM 8.7* 8.4* 7.7* 6.1* 6.7 7.0*   Liver Function Tests: Recent Labs  Lab 02/24/18 1949 02/25/18 1335 03/01/18 1219 03/02/18 0501 03/03/18 0549  AST 538* 229* 140* 61* 37  ALT 307* 240* 147* 92* 72*  ALKPHOS 309* 297*  447* 282* 312*  BILITOT 2.5* 3.2* 4.9* 3.8* 2.3*  PROT 6.7 6.5 6.9 5.1* 5.5*  ALBUMIN 3.6 3.4* 3.4* 2.4* 2.6*   Recent Labs  Lab 02/24/18 1949 03/01/18 1219 03/02/18 1325  LIPASE 24 23 23   AMYLASE  --   --  21*   No results for input(s): AMMONIA in the last 168 hours. CBC: Recent Labs  Lab 02/24/18 1949 02/25/18 1335 03/01/18 1219 03/02/18 0501 03/03/18 0549  WBC 5.6 3.9* 3.9* 12.2* 8.5  NEUTROABS 4.3  --   --   --   --   HGB 12.3 11.2* 12.6 9.8* 9.8*  HCT 36.0 32.7* 37.7 29.3* 29.5*  MCV 92.1 93.7 94.5 95.1 94.9  PLT 278 252 289 224 262   Cardiac Enzymes: No results for input(s): CKTOTAL, CKMB, CKMBINDEX, TROPONINI in the last 168 hours. BNP: Invalid input(s): POCBNP CBG: No results for input(s): GLUCAP in the last 168 hours. D-Dimer No results for input(s): DDIMER in the last 72 hours. Hgb A1c No results for input(s): HGBA1C in the last 72 hours. Lipid Profile No results for input(s): CHOL, HDL, LDLCALC, TRIG, CHOLHDL, LDLDIRECT in the last 72 hours. Thyroid function studies Recent Labs    03/02/18 1125  TSH 1.392   Anemia work up No results for input(s): VITAMINB12, FOLATE, FERRITIN, TIBC, IRON, RETICCTPCT in the last 72 hours. Urinalysis    Component Value Date/Time   COLORURINE AMBER (A) 03/01/2018 1951   APPEARANCEUR CLEAR 03/01/2018 1951   LABSPEC 1.013 03/01/2018 1951   PHURINE 5.0 03/01/2018 1951   GLUCOSEU NEGATIVE 03/01/2018 1951   HGBUR NEGATIVE 03/01/2018 1951   BILIRUBINUR SMALL (A) 03/01/2018 1951   KETONESUR 5 (A) 03/01/2018 1951   PROTEINUR NEGATIVE 03/01/2018 1951   NITRITE NEGATIVE 03/01/2018 1951   LEUKOCYTESUR NEGATIVE 03/01/2018 1951   Sepsis Labs Invalid input(s): PROCALCITONIN,  WBC,  LACTICIDVEN Microbiology Recent Results (from the past 240 hour(s))  Culture, blood (routine x 2)     Status: None (Preliminary result)   Collection Time: 03/01/18  4:48 PM  Result Value Ref Range Status   Specimen Description   Final     BLOOD BLOOD LEFT ARM Performed at [facility], [place] [address], [place], [place] [address]    Special Requests   Final    BOTTLES DRAWN AEROBIC AND ANAEROBIC Blood Culture results may not be optimal due to an inadequate volume of blood received in culture bottles Performed at [place] [address], [place], [place] [address]    Culture   Final    NO GROWTH 1 DAY Performed at [facility], [place] [address], [place], [place] [address]  Report Status PENDING  Incomplete  Culture, blood (routine x 2)     Status: None (Preliminary result)   Collection Time: 03/01/18  4:54 PM  Result Value Ref Range Status   Specimen Description   Final    BLOOD RIGHT ANTECUBITAL Performed at [place] [address] [place], [place] [address]    Special Requests   Final    BOTTLES DRAWN AEROBIC AND ANAEROBIC Blood Culture adequate volume Performed at [place] [address], [place], [place] [address]    Culture   Final    NO GROWTH 1 DAY Performed at [facility], [place] [address], [place], [place] [address]    Report Status PENDING  Incomplete  Culture, Urine     Status: None   Collection Time: 03/01/18  7:51 PM  Result Value Ref Range Status   Specimen Description   Final    URINE, CLEAN CATCH Performed at [facility], [place] [address] [place], [place] [address]    Special Requests   Final    NONE Performed at [facility], [place] [address] [place], [place] [address]    Culture   Final    NO GROWTH Performed at [facility], [place] [address], [place], [place] [address]    Report Status 03/03/2018 FINAL  Final   Time spent: 61min   SIGNED:   Marylu Lund, MD  [facility] 03/03/2018, 3:34 PM  If 7PM-7AM, please contact night-coverage [email or web address] Password [email or web address]

## 2018-03-04 ENCOUNTER — Other Ambulatory Visit: Payer: Self-pay | Admitting: Hematology

## 2018-03-04 DIAGNOSIS — R1084 Generalized abdominal pain: Secondary | ICD-10-CM

## 2018-03-04 DIAGNOSIS — R945 Abnormal results of liver function studies: Secondary | ICD-10-CM

## 2018-03-04 MED ORDER — HYDROCORTISONE 10 MG PO TABS: 10.0000 mg | ORAL_TABLET | Freq: Every day | ORAL | 1 refills | Status: DC

## 2018-03-07 LAB — CULTURE, BLOOD (ROUTINE X 2)
CULTURE: NO GROWTH
CULTURE: NO GROWTH
SPECIAL REQUESTS: ADEQUATE

## 2018-03-11 ENCOUNTER — Ambulatory Visit
Admission: RE | Admit: 2018-03-11 | Discharge: 2018-03-11 | Disposition: A | Discharge status: Home / Self Care | Payer: Medicare Other | Source: Ambulatory Visit | Attending: Radiation Oncology | Admitting: Radiation Oncology

## 2018-03-11 ENCOUNTER — Encounter: Payer: Self-pay | Admitting: Radiation Oncology

## 2018-03-11 VITALS — BP 98/77 | HR 79 | Temp 97.9°F | Resp 20 | Ht 63.0 in | Wt 124.4 lb

## 2018-03-11 DIAGNOSIS — C787 Secondary malignant neoplasm of liver and intrahepatic bile duct: Secondary | ICD-10-CM | POA: Diagnosis not present

## 2018-03-11 DIAGNOSIS — C3490 Malignant neoplasm of unspecified part of unspecified bronchus or lung: Secondary | ICD-10-CM | POA: Insufficient documentation

## 2018-03-11 DIAGNOSIS — K5900 Constipation, unspecified: Secondary | ICD-10-CM | POA: Diagnosis not present

## 2018-03-11 DIAGNOSIS — Z79899 Other long term (current) drug therapy: Secondary | ICD-10-CM | POA: Insufficient documentation

## 2018-03-11 DIAGNOSIS — M8448XA Pathological fracture, other site, initial encounter for fracture: Secondary | ICD-10-CM | POA: Insufficient documentation

## 2018-03-11 DIAGNOSIS — Y842 Radiological procedure and radiotherapy as the cause of abnormal reaction of the patient, or of later complication, without mention of misadventure at the time of the procedure: Secondary | ICD-10-CM | POA: Diagnosis not present

## 2018-03-11 DIAGNOSIS — C7889 Secondary malignant neoplasm of other digestive organs: Secondary | ICD-10-CM | POA: Diagnosis not present

## 2018-03-11 DIAGNOSIS — C7931 Secondary malignant neoplasm of brain: Secondary | ICD-10-CM | POA: Diagnosis present

## 2018-03-11 DIAGNOSIS — E2740 Unspecified adrenocortical insufficiency: Secondary | ICD-10-CM | POA: Insufficient documentation

## 2018-03-11 NOTE — Addendum Note (Signed)
Encounter addended by: Malena Edman, RN on: 03/11/2018 10:28 AM  Actions taken: Charge Capture section accepted

## 2018-03-11 NOTE — Progress Notes (Signed)
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  In summary this is a very pleasant woman with a history of extensive stage small cell carcinoma of the lung who also presented with brain metastases at the time of her staging.  She has received whole brain radiation therapy, and continues with Dr. Irene Limbo on systemic therapy.  Her regimen includes  Atezolizumab which I believe is a consolidative regimen.  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  She continues to take hydrocortisone daily, but states that she has not been seen by endocrinology.  Of note the patient did undergo a CT of the brain without contrast on 03/02/2018 which did not show any evidence of concern for edema or metastatic deposits that had been seen previously on her MRI though this was a noncontrasted and CT based study.  She reports that since completing whole brain radiation in June, that she has been doing pretty well and has not had any headaches that she had previously noticed.  She denies any difficulty with visual or auditory changes.  She denies any dizziness or falls.  No other complaints are verbalized   ALLERGIES:  has No Known  Allergies.  Meds: Current Outpatient Medications  Medication Sig Dispense Refill  . calcium carbonate (OS-CAL - DOSED IN MG OF ELEMENTAL CALCIUM) 1250 (500 Ca) MG tablet Take 1 tablet by mouth daily.     Marland Kitchen docusate sodium (COLACE) 100 MG capsule Take 1 capsule (100 mg total) by mouth 2 (two) times daily. 60 capsule 0  . hydrocortisone (CORTEF) 10 MG tablet Take 1 tablet (10 mg total) by mouth daily with breakfast. And 5mg  po daily with lunch. 60 tablet 1  . lactulose (CEPHULAC) 10 g packet Take 1 packet (10 g total) by mouth 3 (three) times daily as needed (constipation). 30 each 0  . lidocaine-prilocaine (EMLA) cream Apply to affected area once 30 g 3  . LORazepam (ATIVAN) 0.5 MG tablet Take 1 tablet (0.5 mg total) by mouth every 6 (six) hours as needed (Nausea or vomiting). 30 tablet 0  . magic mouthwash w/lidocaine SOLN Take 10 mLs by mouth 4 (four) times daily as needed for mouth pain. Rinse and spit or swallow 240 mL 2  . Multiple Vitamins-Minerals (MULTIVITAMIN WITH MINERALS) tablet Take 1 tablet by mouth daily.    . ondansetron (ZOFRAN) 4 MG tablet Take 1 tablet (4 mg total) by mouth every 8 (eight) hours as needed for nausea or vomiting. 20 tablet 0  . oxyCODONE (OXYCONTIN) 15 mg 12 hr tablet Take 1 tablet (15 mg total) by mouth every 12 (twelve) hours. 60 tablet 0  . polyethylene glycol (MIRALAX) packet Take 17 g by mouth daily as  needed for moderate constipation. (Patient not taking: Reported on 02/25/2018) 30 each 0  . prochlorperazine (COMPAZINE) 10 MG tablet Take 1 tablet (10 mg total) by mouth every 6 (six) hours as needed (Nausea or vomiting). 30 tablet 1  . senna-docusate (SENNA S) 8.6-50 MG tablet Take 2 tablets by mouth at bedtime. (Patient taking differently: Take 2 tablets by mouth at bedtime as needed for mild constipation or moderate constipation. ) 60 tablet 1  . sodium chloride 1 g tablet Take 1 tablet (1 g total) by mouth 3 (three) times daily with meals. (Patient not taking:  Reported on 02/24/2018) 30 tablet 0   No current facility-administered medications for this encounter.    Facility-Administered Medications Ordered in Other Encounters  Medication Dose Route Frequency Provider Last Rate Last Dose  . acetaminophen (TYLENOL) tablet 650 mg  650 mg Oral Once Curt Bears, MD      . diphenhydrAMINE (BENADRYL) capsule 25 mg  25 mg Oral Once Curt Bears, MD        Physical Findings:  height is 5\' 3"  (1.6 m) and weight is 124 lb 6.4 oz (56.4 kg). Her oral temperature is 97.9 F (36.6 C). Her blood pressure is 98/77 and her pulse is 79. Her respiration is 20 and oxygen saturation is 97%.  Pain Assessment Pain Score: 0-No pain/10 In general this is a chronically ill appearing Caucasian female in no acute distress.  She's alert and oriented x4 and appropriate throughout the examination. Cardiopulmonary assessment is negative for acute distress and she exhibits normal effort.  No desquamation is noted along the visible portions of her scalp, though she is wearing a wig.  She denies any concerns with the skin along her scalp.  HEENT reveals that she is normocephalic atraumatic EOMs are intact.  She does have stigmata of chronic steroid use with moon facies.  Lab Findings: Lab Results  Component Value Date   WBC 8.5 03/03/2018   HGB 9.8 (L) 03/03/2018   HCT 29.5 (L) 03/03/2018   MCV 94.9 03/03/2018   PLT 262 03/03/2018     Radiographic Findings: Ct Head Wo Contrast  Result Date: 02/24/2018 CLINICAL DATA:  67 year old female with acute headache and vomiting. Known metastatic lung cancer. EXAM: CT HEAD WITHOUT CONTRAST TECHNIQUE: Contiguous axial images were obtained from the base of the skull through the vertex without intravenous contrast. COMPARISON:  01/12/2018 MR and prior studies FINDINGS: Brain: Small cerebral metastases identified on recent MR are difficult to visualize on CT today. No discrete mass or mass effect, infarct, hemorrhage, midline shift,  extra-axial collection or hydrocephalus noted. Mild chronic small-vessel white matter ischemic changes noted. Vascular: Mild intracranial atherosclerotic calcifications noted. Skull: Normal. Negative for fracture or focal lesion. Sinuses/Orbits: Fluid within the LEFT sphenoid sinus again noted. Other: None. IMPRESSION: 1. No evidence of acute intracranial abnormality. Small cerebral metastases identified on 01/12/2018 MR are not well visualized by CT. 2. Mild chronic small-vessel white matter ischemic changes. Electronically Signed   By: Margarette Canada M.D.   On: 02/24/2018 21:28   Ct Abdomen Pelvis W Contrast  Result Date: 03/02/2018 CLINICAL DATA:  Abdominal pain, history of small cell lung cancer. EXAM: CT ABDOMEN AND PELVIS WITH CONTRAST TECHNIQUE: Multidetector CT imaging of the abdomen and pelvis was performed using the standard protocol following bolus administration of intravenous contrast. CONTRAST:  170mL ISOVUE-300 IOPAMIDOL (ISOVUE-300) INJECTION 61%, 32mL ISOVUE-300 IOPAMIDOL (ISOVUE-300) INJECTION 61% COMPARISON:  12/26/2017 FINDINGS: Lower chest: Small bilateral pleural effusions are again noted. Right lower lobe  lung mass with postobstructive atelectasis and consolidation is again identified.Superimposed interstitial thickening noted which may reflect pulmonary edema. Hepatobiliary: There is no acute liver findings. A stent is identified within the common bile duct. Pneumobilia is identified reflecting biliary patency. Liver metastasis within the posterior right lobe measures 1 cm, image 28/2. Unchanged from previous exam. Segment 4b liver metastasis measures 1.1 cm, image 39/2. Previously 1 cm. Diffuse periportal edema is identified. Mild gallbladder wall edema noted. No gallstones or pericholecystic fluid identified. Pancreas: Unremarkable appearance of the pancreas. Pancreatic head mass is no longer measurable. Spleen: Spleen normal. Adrenals/Urinary Tract: Unremarkable appearance of the adrenal  glands. Kidneys are normal. Urinary bladder appears within normal limits. Stomach/Bowel: Stomach normal. The small bowel loops have a normal course and caliber without obstruction. The appendix is visualized and appears normal. The colon is negative. Vascular/Lymphatic: Aortic atherosclerosis without aneurysm. No upper abdominal adenopathy. No pelvic or inguinal adenopathy. Reproductive: Uterus and bilateral adnexa are unremarkable. Other: There is a small volume of free fluid identified within the pelvis. New from previous PET-CT dated 12/26/2017. Musculoskeletal: Widespread sclerotic bone metastases are identified. Again noted is a vertically oriented pathologic fracture involving the L2 vertebral body with retropulsion and spinal canal encroachment as demonstrated previously, image 63/6. IMPRESSION: 1. No specific findings identified to explain patient's acute abdominal pain. 2. Bilateral pleural effusions and mild interstitial edema may reflect CHF. 3. Right hilar lung mass with post obstructive pneumonitis of the right lower lobe is again noted. 4. Biliary stent remains in place with pneumobilia reflecting biliary patency. 5. Liver metastasis as described on 02/25/2018. 6. New small volume of free fluid within the pelvis.  Nonspecific. 7. Widespread bone metastasis with pathologic fracture involving the L2 vertebral body as noted previously. Electronically Signed   By: Kerby Moors M.D.   On: 03/02/2018 17:01   Mr 3d Recon At Scanner  Result Date: 03/02/2018 CLINICAL DATA:  Nonspecific (abnormal) findings on radiological and other examination of musculoskeletal sysem. EXAM: 3-DIMENSIONAL MR IMAGE RENDERING ON AQUISITION WORKSTATION TECHNIQUE: 3-dimensional MR images were rendered by post-processing of the original MR data on an aquisition workstation. The 3-dimensional MR images were interpreted and findings were reported in the accompanying complete MR report for this study. Please see accession number  [id number] for Dr. Kathie Dike report on this case. Electronically Signed   By: Van Clines M.D.   On: 03/02/2018 06:40   Dg Chest Portable 1 View  Result Date: 02/24/2018 CLINICAL DATA:  Acute shortness of breath and fatigue. EXAM: PORTABLE CHEST 1 VIEW COMPARISON:  12/26/2017 PET CT, 12/19/2017 chest radiograph and priors FINDINGS: The cardiomediastinal silhouette is unremarkable. A RIGHT Port-A-Cath is again noted with tip overlying the mid SVC. RIGHT basilar atelectasis/scarring again noted. There is no evidence of focal airspace disease, pulmonary edema, suspicious pulmonary nodule/mass, pleural effusion, or pneumothorax. No acute bony abnormalities are identified. IMPRESSION: No evidence of acute cardiopulmonary disease. Electronically Signed   By: Margarette Canada M.D.   On: 02/24/2018 19:57   Dg Abdomen Acute W/chest  Result Date: 03/01/2018 CLINICAL DATA:  Weakness, nausea, vomiting, small cell lung cancer EXAM: DG ABDOMEN ACUTE W/ 1V CHEST COMPARISON:  02/24/2018 chest radiograph. Abdominal sonogram from earlier today. 12/26/2017 PET-CT. 02/25/2018 MRI abdomen. FINDINGS: Right internal jugular MediPort terminates in the lower third of the SVC. Stable cardiomediastinal silhouette with normal heart size. No pneumothorax. Stable slight blunting of the costophrenic angles bilaterally. No pulmonary edema. Mild streaky left lung base opacity, new. No acute consolidative airspace disease.  CBD stent is in the expected location in the medial right upper abdomen. No dilated small bowel loops or air-fluid levels. Minimal gas and stool in the large bowel. No evidence of pneumatosis or pneumoperitoneum. No radiopaque nephrolithiasis. IMPRESSION: 1. Mild streaky left lung base opacity, favor mild atelectasis. 2. Stable slight blunting of the bilateral costophrenic angles, favor minimal pleural-parenchymal scarring, cannot exclude trace effusions. 3. Nonobstructive bowel gas pattern.  CBD stent in place.  Electronically Signed   By: Ilona Sorrel M.D.   On: 03/01/2018 15:17   Mr Abdomen Mrcp Moise Boring Contast  Result Date: 02/25/2018 CLINICAL DATA:  Metastatic pancreatic cancer with biliary stent. Elevated liver function studies. EXAM: MRI ABDOMEN WITHOUT AND WITH CONTRAST (INCLUDING MRCP) TECHNIQUE: Multiplanar multisequence MR imaging of the abdomen was performed both before and after the administration of intravenous contrast. Heavily T2-weighted images of the biliary and pancreatic ducts were obtained, and three-dimensional MRCP images were rendered by post processing. CONTRAST:  27mL MULTIHANCE GADOBENATE DIMEGLUMINE 529 MG/ML IV SOLN COMPARISON:  MRI abdomen 10/15/2017 and PET-CT 12/26/2017 FINDINGS: Lower chest: Persistent right basilar airspace consolidation. No pleural effusions. No pericardial effusion. No obvious pulmonary lesions. Hepatobiliary: There are only a few tiny residual hepatic lesions. There was fairly widespread metastatic disease on the prior MRI which shows a remarkable improvement. Persistent intra and extrahepatic biliary dilatation. The biliary stent is in good position. No obvious occlusion. The gallbladder is mildly distended.  Layering sludge is noted. Pancreas: The pancreatic head mass is not well demonstrated. This represents a significant change when compared to the prior study. A few small scattered peripancreatic lymph nodes are noted but no mass or progressive adenopathy. Spleen: Normal size. No focal lesions. The splenic and portal veins are patent. Adrenals/Urinary Tract: The adrenal glands and kidneys are unremarkable and stable. Stomach/Bowel: Visualized portions within the abdomen are unremarkable. Vascular/Lymphatic: The aorta and branch vessels are patent. The major venous structures are patent. Other:  No ascites or abdominal wall hernia. Musculoskeletal: Numerous bony lesions are noted on the T2 weighted images and some of these demonstrate mild enhancement. Findings  suggest treated metastasis. Compression fracture of L2 with moderate stable spinal canal encroachment. IMPRESSION: 1. Significant interval improvement since the prior MR examination. The pancreatic head mass is difficult to identify and the hepatic metastatic disease is near completely resolved. 2. Stable intra and extrahepatic biliary dilatation. The common bile duct stent appears to be in good position without complicating features. 3. Mild distention of the gallbladder with some layering sludge. 4. Numerous metastatic bone lesions with minimal enhancement suggesting improving/treated metastatic disease. 5. Persistent right basilar airspace opacity. Electronically Signed   By: Marijo Sanes M.D.   On: 02/25/2018 20:37   US Abdomen Limited Ruq  Result Date: 03/01/2018 CLINICAL DATA:  Upper abdominal pain for 8 days. History of metastatic pancreatic carcinoma. EXAM: ULTRASOUND ABDOMEN LIMITED RIGHT UPPER QUADRANT COMPARISON:  MRCP 02/25/2018. FINDINGS: Gallbladder: There is some sludge and a few small stones within the gallbladder. No gallbladder wall thickening or pericholecystic fluid is identified. Sonographer reports negative Murphy's sign. Common bile duct: Diameter: 12.2 cm with a common bile duct stent in place. Liver: Intrahepatic biliary ductal dilatation is seen as identified on the prior exam. 1.6 cm lesion in the right hepatic lobe is compatible with a metastatic deposit as seen on the prior exam. Portal vein is patent on color Doppler imaging with normal direction of blood flow towards the liver. IMPRESSION: No acute abnormality. Small volume of gallbladder sludge with  a few stones. Negative for cholecystitis. Unchanged intra and extrahepatic biliary ductal dilatation with the common bile duct stent in place. Small lesion in the right lobe of the liver compatible with metastatic disease as seen on the prior exam. Electronically Signed   By: Inge Rise M.D.   On: 03/01/2018 13:04     Impression/Plan: 1. 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 The patient is recovering the effects of her radiotherapy however she does have multifocal factorial reasons for having fatigue and feeling poorly overall.  I suspect that some of this has to do with her adrenal insufficiency.  I will send a message to Dr. Grier Mitts nurse to see if they can expedite her referral to endocrinology.Marland KitchenShe will continue Atezolizumab with Dr. Irene Limbo and is due for her next infusion on 03/23/18. We will begin surveillance in brain/spine oncology conference and see her back for her next brain MRI review in about 3 months since she did have a CT brain on 03/02/18 that was reassuring.  We also discussed the limitations of comparing imaging as her CT in July was noncontrasted and was not as definitive as an MRI would be however the patient would like the opportunity to have this performed in October if that Dr. Lisbeth Renshaw is in agreement.  I will discuss this with him when he returns to clinic, and brain oncology navigation will be in touch with the patient to coordinate her imaging.  I will see her back to review these results and she will be followed thereafter every 3 months for the first year for her treatment and at appropriate intervals per NCCN guidelines thereafter. 2. Adrenal insufficiency.  As per #1 I will contact Dr. Grier Mitts nurse to coordinate endocrinology evaluation. 3. 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  The patient's biliary stent was patent during its assessment while she was hospitalized earlier this month.  If she develops increasing abdominal pain in the right upper quadrant or postprandially, this could be further investigated. 4. History of L2 compression fracture felt to be pathologic.  The patient and I did discuss the findings from her initial assessment which includes compression fracture due to malignancy in the lumbar spine.  At this time she would like to  hold off on any additional therapy, as she feels her pain is relieved with narcotic therapy.  We did discuss that it would be preferable to hold off until she completes chemotherapy otherwise this could be a longer course of treatment in a concurrent manner.  She would rather hold out for osteo-cool kyphoplasty procedure at a later date provided that this stays stable. 5. Constipation.  We discussed a regimen of MiraLAX daily, as well as when to use her lactulose which she still has a prescription for.  We will follow this expectantly.       Carola Rhine, PAC

## 2018-03-16 ENCOUNTER — Inpatient Hospital Stay: Payer: Medicare Other

## 2018-03-16 ENCOUNTER — Other Ambulatory Visit: Payer: Self-pay | Admitting: Medical

## 2018-03-16 ENCOUNTER — Telehealth: Payer: Self-pay

## 2018-03-16 ENCOUNTER — Inpatient Hospital Stay (HOSPITAL_BASED_OUTPATIENT_CLINIC_OR_DEPARTMENT_OTHER): Payer: Medicare Other

## 2018-03-16 VITALS — BP 98/64 | HR 87 | Temp 97.7°F | Resp 20 | Ht 63.0 in | Wt 123.5 lb

## 2018-03-16 DIAGNOSIS — K5903 Drug induced constipation: Secondary | ICD-10-CM | POA: Diagnosis not present

## 2018-03-16 DIAGNOSIS — E2740 Unspecified adrenocortical insufficiency: Secondary | ICD-10-CM | POA: Diagnosis not present

## 2018-03-16 DIAGNOSIS — R112 Nausea with vomiting, unspecified: Secondary | ICD-10-CM | POA: Diagnosis present

## 2018-03-16 DIAGNOSIS — Z87891 Personal history of nicotine dependence: Secondary | ICD-10-CM | POA: Diagnosis not present

## 2018-03-16 DIAGNOSIS — C7931 Secondary malignant neoplasm of brain: Secondary | ICD-10-CM | POA: Diagnosis not present

## 2018-03-16 DIAGNOSIS — K5900 Constipation, unspecified: Secondary | ICD-10-CM

## 2018-03-16 DIAGNOSIS — R531 Weakness: Secondary | ICD-10-CM

## 2018-03-16 DIAGNOSIS — F419 Anxiety disorder, unspecified: Secondary | ICD-10-CM | POA: Diagnosis not present

## 2018-03-16 DIAGNOSIS — R110 Nausea: Secondary | ICD-10-CM

## 2018-03-16 DIAGNOSIS — E860 Dehydration: Secondary | ICD-10-CM

## 2018-03-16 DIAGNOSIS — F1190 Opioid use, unspecified, uncomplicated: Secondary | ICD-10-CM | POA: Diagnosis not present

## 2018-03-16 DIAGNOSIS — C7951 Secondary malignant neoplasm of bone: Secondary | ICD-10-CM | POA: Diagnosis not present

## 2018-03-16 DIAGNOSIS — C7970 Secondary malignant neoplasm of unspecified adrenal gland: Secondary | ICD-10-CM | POA: Diagnosis not present

## 2018-03-16 DIAGNOSIS — C3490 Malignant neoplasm of unspecified part of unspecified bronchus or lung: Secondary | ICD-10-CM | POA: Diagnosis not present

## 2018-03-16 DIAGNOSIS — Z9221 Personal history of antineoplastic chemotherapy: Secondary | ICD-10-CM | POA: Diagnosis not present

## 2018-03-16 DIAGNOSIS — E871 Hypo-osmolality and hyponatremia: Secondary | ICD-10-CM | POA: Diagnosis not present

## 2018-03-16 DIAGNOSIS — R945 Abnormal results of liver function studies: Secondary | ICD-10-CM | POA: Diagnosis not present

## 2018-03-16 DIAGNOSIS — Z79899 Other long term (current) drug therapy: Secondary | ICD-10-CM | POA: Diagnosis not present

## 2018-03-16 DIAGNOSIS — E278 Other specified disorders of adrenal gland: Secondary | ICD-10-CM | POA: Diagnosis not present

## 2018-03-16 DIAGNOSIS — C787 Secondary malignant neoplasm of liver and intrahepatic bile duct: Secondary | ICD-10-CM | POA: Diagnosis not present

## 2018-03-16 DIAGNOSIS — M4856XA Collapsed vertebra, not elsewhere classified, lumbar region, initial encounter for fracture: Secondary | ICD-10-CM | POA: Diagnosis not present

## 2018-03-16 LAB — CBC WITH DIFFERENTIAL (CANCER CENTER ONLY)
Basophils Absolute: 0.1 10*3/uL (ref 0.0–0.1)
Basophils Relative: 1 %
EOS ABS: 0 10*3/uL (ref 0.0–0.5)
Eosinophils Relative: 1 %
HCT: 39 % (ref 34.8–46.6)
Hemoglobin: 12.8 g/dL (ref 11.6–15.9)
LYMPHS ABS: 1.6 10*3/uL (ref 0.9–3.3)
LYMPHS PCT: 25 %
MCH: 30.3 pg (ref 25.1–34.0)
MCHC: 32.8 g/dL (ref 31.5–36.0)
MCV: 92.4 fL (ref 79.5–101.0)
Monocytes Absolute: 0.9 10*3/uL (ref 0.1–0.9)
Monocytes Relative: 15 %
Neutro Abs: 3.8 10*3/uL (ref 1.5–6.5)
Neutrophils Relative %: 58 %
Platelet Count: 388 10*3/uL (ref 145–400)
RBC: 4.22 MIL/uL (ref 3.70–5.45)
RDW: 15.4 % — ABNORMAL HIGH (ref 11.2–14.5)
WBC: 6.5 10*3/uL (ref 3.9–10.3)

## 2018-03-16 LAB — CMP (CANCER CENTER ONLY)
ALT: 26 U/L (ref 0–44)
ANION GAP: 7 (ref 5–15)
AST: 26 U/L (ref 15–41)
Albumin: 3.5 g/dL (ref 3.5–5.0)
Alkaline Phosphatase: 193 U/L — ABNORMAL HIGH (ref 38–126)
BUN: 6 mg/dL — ABNORMAL LOW (ref 8–23)
CALCIUM: 8.5 mg/dL — AB (ref 8.9–10.3)
CHLORIDE: 97 mmol/L — AB (ref 98–111)
CO2: 24 mmol/L (ref 22–32)
Creatinine: 0.62 mg/dL (ref 0.44–1.00)
GFR, Estimated: >60 mL/min (ref >60)
GLUCOSE: 115 mg/dL — AB (ref 70–99)
POTASSIUM: 4.7 mmol/L (ref 3.5–5.1)
SODIUM: 128 mmol/L — AB (ref 135–145)
Total Bilirubin: 0.9 mg/dL (ref 0.3–1.2)
Total Protein: 7.2 g/dL (ref 6.5–8.1)

## 2018-03-16 MED ORDER — METOCLOPRAMIDE HCL 10 MG PO TABS: 10.0000 mg | ORAL_TABLET | Freq: Three times a day (TID) | ORAL | 1 refills | Status: DC | PRN

## 2018-03-16 MED ORDER — METOCLOPRAMIDE HCL 5 MG/ML IJ SOLN
INTRAMUSCULAR | Status: AC
  Filled 2018-03-16: qty 2

## 2018-03-16 MED ORDER — SODIUM CHLORIDE 0.9 % IV SOLN
1000.0000 mL | Freq: Once | INTRAVENOUS | Status: AC
  Administered 2018-03-16: 1000 mL via INTRAVENOUS

## 2018-03-16 MED ORDER — METOCLOPRAMIDE HCL 5 MG/ML IJ SOLN
10.0000 mg | Freq: Once | INTRAMUSCULAR | Status: AC
  Administered 2018-03-16: 10 mg via INTRAVENOUS

## 2018-03-16 NOTE — Progress Notes (Addendum)
[facility] Progress Note   Haley [name] [id number] [birth year] 67 y.o.  Haley [name] is managed by Dr. Sullivan Lone   Assessment: Plan:    Non-intractable vomiting with nausea, unspecified vomiting type - Plan: metoCLOPramide (REGLAN) 10 MG tablet, metoCLOPramide (REGLAN) injection 10 mg, 0.9 %  sodium chloride infusion  Constipation, unspecified constipation type  Dehydration   Nausea and vomiting with constipation: The patient was given Reglan 10 mg IV x1 and was given a prescription for Reglan 10 mg p.o. 3 times daily.  She was warned to stop Reglan should she experience any extraparametal symptoms such as facial twitching or involuntary movements.  Dehydration: The patient was given 1 L of normal saline IV.  Please see After Visit Summary for patient specific instructions.  Future Appointments  Date Time Provider [place]  03/23/2018  8:15 AM [facility] LAB 2 [facility] None  03/23/2018  8:30 AM [facility] [place] FLUSH [facility] None  03/23/2018  9:00 AM Brunetta Genera, MD [facility] None  03/23/2018 10:30 AM [facility] INFUSION [facility] None  03/23/2018 11:15 AM Karie Mainland, RD [facility] None  04/13/2018 10:00 AM [facility] LAB 6 [facility] None  04/13/2018 10:15 AM [facility] [place] FLUSH [facility] None  04/13/2018 10:40 AM Brunetta Genera, MD [facility] None  04/13/2018 11:45 AM [facility] INFUSION [facility] None  05/04/2018  1:00 PM [facility] LAB 6 [facility] None  05/04/2018  1:15 PM [facility] [place] FLUSH [facility] None  05/04/2018  1:40 PM Brunetta Genera, MD [facility] None  05/04/2018  2:30 PM [facility] INFUSION [facility] None  05/05/2018  1:15 PM Renato Shin, MD [facility] None  08/04/2018 10:30 AM Alycia Rossetti, MD [facility] None    No orders of the defined types were placed in this encounter.      Subjective:   Patient ID:  Haley [name] is a 67 y.o. (DOB September 12, 1950) female.  Chief Complaint: No  chief complaint on file.   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  She presents today with nausea and vomiting, anorexia, fatigue, generalized weakness, and constipation.  She is scheduled to have cycle 7, day 1 of atezolizumab on 03/02/2018 but missed this due to a hospitalization on 03/01/2018 for intractable vomiting.  She has been using stool softeners and MiraLAX without good relief.  She denies headaches, visual changes, diarrhea, fevers, chills, or sweats.  Medications: I have reviewed the patient's current medications.  Allergies: No Known Allergies  Past Medical History:  Diagnosis Date  . Allergy   . Back pain   . Cancer ([place])   . Osteopenia     Past Surgical History:  Procedure Laterality Date  . COLONOSCOPY    . COLONOSCOPY WITH PROPOFOL N/A 10/25/2014   Procedure: COLONOSCOPY WITH PROPOFOL;  Surgeon: Garlan Fair, MD;  Location: [facility];  Service: Endoscopy;  Laterality: N/A;  . ERCP N/A 10/15/2017   Procedure: ENDOSCOPIC RETROGRADE CHOLANGIOPANCREATOGRAPHY (ERCP);  Surgeon: Clarene Essex, MD;  Location: [facility];  Service: Endoscopy;  Laterality: N/A;  . IR FLUORO GUIDE PORT INSERTION RIGHT  11/10/2017  . IR RADIOLOGIST EVAL & MGMT  10/02/2017  . IR US GUIDE VASC ACCESS RIGHT  11/10/2017    Family History  Problem Relation Age of Onset  . Diabetes Mother   . Hypertension Mother   . Diabetes Sister   . Hypertension Sister   . Hyperlipidemia Sister  Social History   Socioeconomic History  . Marital status: Single    Spouse name: Not on file  . Number of children: Not on file  . Years of education: Not on file  . Highest education level: Not on file  Occupational History  . Not on file  Social Needs  . Financial resource strain: Not on file  . Food insecurity:    Worry: Not on file    Inability: Not on file  . Transportation needs:      Medical: Not on file    Non-medical: Not on file  Tobacco Use  . Smoking status: Former Smoker    Packs/day: 1.00    Years: 40.00    Pack years: 40.00    Types: Cigarettes    Last attempt to quit: 09/24/2017    Years since quitting: 0.4  . Smokeless tobacco: Never Used  Substance and Sexual Activity  . Alcohol use: Yes    Comment: rare social  . Drug use: No  . Sexual activity: Not Currently  Lifestyle  . Physical activity:    Days per week: Not on file    Minutes per session: Not on file  . Stress: Not on file  Relationships  . Social connections:    Talks on phone: Not on file    Gets together: Not on file    Attends religious service: Not on file    Active member of club or organization: Not on file    Attends meetings of clubs or organizations: Not on file    Relationship status: Not on file  . Intimate partner violence:    Fear of current or ex partner: Not on file    Emotionally abused: Not on file    Physically abused: Not on file    Forced sexual activity: Not on file  Other Topics Concern  . Not on file  Social History Narrative  . Not on file    Past Medical History, Surgical history, Social history, and Family history were reviewed and updated as appropriate.   Please see review of systems for further details on the patient's review from today.   Review of Systems:  Review of Systems  Constitutional: Positive for appetite change. Negative for chills, diaphoresis and fever.  Respiratory: Negative for cough, choking, shortness of breath and wheezing.   Cardiovascular: Negative for chest pain and palpitations.  Gastrointestinal: Positive for nausea and vomiting. Negative for constipation and diarrhea.  Genitourinary: Negative for decreased urine volume.  Neurological: Negative for headaches.    Objective:   Physical Exam:  BP 98/64 (BP Location: Right Arm, Patient Position: Sitting)   Pulse 87   Temp 97.7 F (36.5 C) (Oral)   Resp 20   Ht 5\' 3"   (1.6 m)   Wt 123 lb 8 oz (56 kg)   SpO2 90%   BMI 21.88 kg/m  ECOG: 1  Physical Exam  Constitutional: No distress.  HENT:  Head: Normocephalic and atraumatic.  Mouth/Throat: Oropharynx is clear and moist.  Cardiovascular: Normal rate, regular rhythm and normal heart sounds. Exam reveals no gallop and no friction rub.  No murmur heard. Pulmonary/Chest: Effort normal and breath sounds normal. No stridor. No respiratory distress. She has no wheezes. She has no rales.  Abdominal: Soft. Bowel sounds are normal. She exhibits no distension and no mass. There is no tenderness. There is no rebound and no guarding.  Neurological: She is alert. Coordination normal.  Skin: Skin is warm and dry. She is not  diaphoretic.    Lab Review:     Component Value Date/Time   NA 128 (L) 03/16/2018 0928   K 4.7 03/16/2018 0928   CL 97 (L) 03/16/2018 0928   CO2 24 03/16/2018 0928   GLUCOSE 115 (H) 03/16/2018 0928   BUN 6 (L) 03/16/2018 0928   CREATININE 0.62 03/16/2018 0928   CREATININE 0.80 05/12/2017 1110   CALCIUM 8.5 (L) 03/16/2018 0928   CALCIUM 6.7 03/02/2018 1125   PROT 7.2 03/16/2018 0928   ALBUMIN 3.5 03/16/2018 0928   AST 26 03/16/2018 0928   ALT 26 03/16/2018 0928   ALKPHOS 193 (H) 03/16/2018 0928   BILITOT 0.9 03/16/2018 0928   GFRNONAA >60 03/16/2018 0928   GFRNONAA 81 05/03/2016 0827   GFRAA >60 03/16/2018 0928   GFRAA >89 05/03/2016 0827       Component Value Date/Time   WBC 6.5 03/16/2018 0928   WBC 8.5 03/03/2018 0549   RBC 4.22 03/16/2018 0928   HGB 12.8 03/16/2018 0928   HCT 39.0 03/16/2018 0928   PLT 388 03/16/2018 0928   MCV 92.4 03/16/2018 0928   MCH 30.3 03/16/2018 0928   MCHC 32.8 03/16/2018 0928   RDW 15.4 (H) 03/16/2018 0928   LYMPHSABS 1.6 03/16/2018 0928   MONOABS 0.9 03/16/2018 0928   EOSABS 0.0 03/16/2018 0928   BASOSABS 0.1 03/16/2018 0928   -------------------------------  Imaging from last 24 hours (if applicable):  Radiology  interpretation: Ct Head Wo Contrast  Result Date: 02/24/2018 CLINICAL DATA:  67 year old female with acute headache and vomiting. Known metastatic lung cancer. EXAM: CT HEAD WITHOUT CONTRAST TECHNIQUE: Contiguous axial images were obtained from the base of the skull through the vertex without intravenous contrast. COMPARISON:  01/12/2018 MR and prior studies FINDINGS: Brain: Small cerebral metastases identified on recent MR are difficult to visualize on CT today. No discrete mass or mass effect, infarct, hemorrhage, midline shift, extra-axial collection or hydrocephalus noted. Mild chronic small-vessel white matter ischemic changes noted. Vascular: Mild intracranial atherosclerotic calcifications noted. Skull: Normal. Negative for fracture or focal lesion. Sinuses/Orbits: Fluid within the LEFT sphenoid sinus again noted. Other: None. IMPRESSION: 1. No evidence of acute intracranial abnormality. Small cerebral metastases identified on 01/12/2018 MR are not well visualized by CT. 2. Mild chronic small-vessel white matter ischemic changes. Electronically Signed   By: Margarette Canada M.D.   On: 02/24/2018 21:28   Ct Abdomen Pelvis W Contrast  Result Date: 03/02/2018 CLINICAL DATA:  Abdominal pain, history of small cell lung cancer. EXAM: CT ABDOMEN AND PELVIS WITH CONTRAST TECHNIQUE: Multidetector CT imaging of the abdomen and pelvis was performed using the standard protocol following bolus administration of intravenous contrast. CONTRAST:  160mL ISOVUE-300 IOPAMIDOL (ISOVUE-300) INJECTION 61%, 31mL ISOVUE-300 IOPAMIDOL (ISOVUE-300) INJECTION 61% COMPARISON:  12/26/2017 FINDINGS: Lower chest: Small bilateral pleural effusions are again noted. Right lower lobe lung mass with postobstructive atelectasis and consolidation is again identified.Superimposed interstitial thickening noted which may reflect pulmonary edema. Hepatobiliary: There is no acute liver findings. A stent is identified within the common bile duct.  Pneumobilia is identified reflecting biliary patency. Liver metastasis within the posterior right lobe measures 1 cm, image 28/2. Unchanged from previous exam. Segment 4b liver metastasis measures 1.1 cm, image 39/2. Previously 1 cm. Diffuse periportal edema is identified. Mild gallbladder wall edema noted. No gallstones or pericholecystic fluid identified. Pancreas: Unremarkable appearance of the pancreas. Pancreatic head mass is no longer measurable. Spleen: Spleen normal. Adrenals/Urinary Tract: Unremarkable appearance of the adrenal glands. Kidneys are normal. Urinary  bladder appears within normal limits. Stomach/Bowel: Stomach normal. The small bowel loops have a normal course and caliber without obstruction. The appendix is visualized and appears normal. The colon is negative. Vascular/Lymphatic: Aortic atherosclerosis without aneurysm. No upper abdominal adenopathy. No pelvic or inguinal adenopathy. Reproductive: Uterus and bilateral adnexa are unremarkable. Other: There is a small volume of free fluid identified within the pelvis. New from previous PET-CT dated 12/26/2017. Musculoskeletal: Widespread sclerotic bone metastases are identified. Again noted is a vertically oriented pathologic fracture involving the L2 vertebral body with retropulsion and spinal canal encroachment as demonstrated previously, image 63/6. IMPRESSION: 1. No specific findings identified to explain patient's acute abdominal pain. 2. Bilateral pleural effusions and mild interstitial edema may reflect CHF. 3. Right hilar lung mass with post obstructive pneumonitis of the right lower lobe is again noted. 4. Biliary stent remains in place with pneumobilia reflecting biliary patency. 5. Liver metastasis as described on 02/25/2018. 6. New small volume of free fluid within the pelvis.  Nonspecific. 7. Widespread bone metastasis with pathologic fracture involving the L2 vertebral body as noted previously. Electronically Signed   By: Kerby Moors M.D.   On: 03/02/2018 17:01   Mr 3d Recon At Scanner  Result Date: 03/02/2018 CLINICAL DATA:  Nonspecific (abnormal) findings on radiological and other examination of musculoskeletal sysem. EXAM: 3-DIMENSIONAL MR IMAGE RENDERING ON AQUISITION WORKSTATION TECHNIQUE: 3-dimensional MR images were rendered by post-processing of the original MR data on an aquisition workstation. The 3-dimensional MR images were interpreted and findings were reported in the accompanying complete MR report for this study. Please see accession number [id number] for Dr. Kathie Dike report on this case. Electronically Signed   By: Van Clines M.D.   On: 03/02/2018 06:40   Dg Chest Portable 1 View  Result Date: 02/24/2018 CLINICAL DATA:  Acute shortness of breath and fatigue. EXAM: PORTABLE CHEST 1 VIEW COMPARISON:  12/26/2017 PET CT, 12/19/2017 chest radiograph and priors FINDINGS: The cardiomediastinal silhouette is unremarkable. A RIGHT Port-A-Cath is again noted with tip overlying the mid SVC. RIGHT basilar atelectasis/scarring again noted. There is no evidence of focal airspace disease, pulmonary edema, suspicious pulmonary nodule/mass, pleural effusion, or pneumothorax. No acute bony abnormalities are identified. IMPRESSION: No evidence of acute cardiopulmonary disease. Electronically Signed   By: Margarette Canada M.D.   On: 02/24/2018 19:57   Dg Abdomen Acute W/chest  Result Date: 03/01/2018 CLINICAL DATA:  Weakness, nausea, vomiting, small cell lung cancer EXAM: DG ABDOMEN ACUTE W/ 1V CHEST COMPARISON:  02/24/2018 chest radiograph. Abdominal sonogram from earlier today. 12/26/2017 PET-CT. 02/25/2018 MRI abdomen. FINDINGS: Right internal jugular MediPort terminates in the lower third of the SVC. Stable cardiomediastinal silhouette with normal heart size. No pneumothorax. Stable slight blunting of the costophrenic angles bilaterally. No pulmonary edema. Mild streaky left lung base opacity, new. No acute  consolidative airspace disease. CBD stent is in the expected location in the medial right upper abdomen. No dilated small bowel loops or air-fluid levels. Minimal gas and stool in the large bowel. No evidence of pneumatosis or pneumoperitoneum. No radiopaque nephrolithiasis. IMPRESSION: 1. Mild streaky left lung base opacity, favor mild atelectasis. 2. Stable slight blunting of the bilateral costophrenic angles, favor minimal pleural-parenchymal scarring, cannot exclude trace effusions. 3. Nonobstructive bowel gas pattern.  CBD stent in place. Electronically Signed   By: Ilona Sorrel M.D.   On: 03/01/2018 15:17   Mr Abdomen Mrcp Moise Boring Contast  Result Date: 02/25/2018 CLINICAL DATA:  Metastatic pancreatic cancer with biliary stent. Elevated  liver function studies. EXAM: MRI ABDOMEN WITHOUT AND WITH CONTRAST (INCLUDING MRCP) TECHNIQUE: Multiplanar multisequence MR imaging of the abdomen was performed both before and after the administration of intravenous contrast. Heavily T2-weighted images of the biliary and pancreatic ducts were obtained, and three-dimensional MRCP images were rendered by post processing. CONTRAST:  59mL MULTIHANCE GADOBENATE DIMEGLUMINE 529 MG/ML IV SOLN COMPARISON:  MRI abdomen 10/15/2017 and PET-CT 12/26/2017 FINDINGS: Lower chest: Persistent right basilar airspace consolidation. No pleural effusions. No pericardial effusion. No obvious pulmonary lesions. Hepatobiliary: There are only a few tiny residual hepatic lesions. There was fairly widespread metastatic disease on the prior MRI which shows a remarkable improvement. Persistent intra and extrahepatic biliary dilatation. The biliary stent is in good position. No obvious occlusion. The gallbladder is mildly distended.  Layering sludge is noted. Pancreas: The pancreatic head mass is not well demonstrated. This represents a significant change when compared to the prior study. A few small scattered peripancreatic lymph nodes are noted but no  mass or progressive adenopathy. Spleen: Normal size. No focal lesions. The splenic and portal veins are patent. Adrenals/Urinary Tract: The adrenal glands and kidneys are unremarkable and stable. Stomach/Bowel: Visualized portions within the abdomen are unremarkable. Vascular/Lymphatic: The aorta and branch vessels are patent. The major venous structures are patent. Other:  No ascites or abdominal wall hernia. Musculoskeletal: Numerous bony lesions are noted on the T2 weighted images and some of these demonstrate mild enhancement. Findings suggest treated metastasis. Compression fracture of L2 with moderate stable spinal canal encroachment. IMPRESSION: 1. Significant interval improvement since the prior MR examination. The pancreatic head mass is difficult to identify and the hepatic metastatic disease is near completely resolved. 2. Stable intra and extrahepatic biliary dilatation. The common bile duct stent appears to be in good position without complicating features. 3. Mild distention of the gallbladder with some layering sludge. 4. Numerous metastatic bone lesions with minimal enhancement suggesting improving/treated metastatic disease. 5. Persistent right basilar airspace opacity. Electronically Signed   By: Marijo Sanes M.D.   On: 02/25/2018 20:37   US Abdomen Limited Ruq  Result Date: 03/01/2018 CLINICAL DATA:  Upper abdominal pain for 8 days. History of metastatic pancreatic carcinoma. EXAM: ULTRASOUND ABDOMEN LIMITED RIGHT UPPER QUADRANT COMPARISON:  MRCP 02/25/2018. FINDINGS: Gallbladder: There is some sludge and a few small stones within the gallbladder. No gallbladder wall thickening or pericholecystic fluid is identified. Sonographer reports negative Murphy's sign. Common bile duct: Diameter: 12.2 cm with a common bile duct stent in place. Liver: Intrahepatic biliary ductal dilatation is seen as identified on the prior exam. 1.6 cm lesion in the right hepatic lobe is compatible with a metastatic  deposit as seen on the prior exam. Portal vein is patent on color Doppler imaging with normal direction of blood flow towards the liver. IMPRESSION: No acute abnormality. Small volume of gallbladder sludge with a few stones. Negative for cholecystitis. Unchanged intra and extrahepatic biliary ductal dilatation with the common bile duct stent in place. Small lesion in the right lobe of the liver compatible with metastatic disease as seen on the prior exam. Electronically Signed   By: Inge Rise M.D.   On: 03/01/2018 13:04

## 2018-03-16 NOTE — Patient Instructions (Signed)
Dehydration, Adult Dehydration is when there is not enough fluid or water in your body. This happens when you lose more fluids than you take in. Dehydration can range from mild to very bad. It should be treated right away to keep it from getting very bad. Symptoms of mild dehydration may include:  Thirst.  Dry lips.  Slightly dry mouth.  Dry, warm skin.  Dizziness. Symptoms of moderate dehydration may include:  Very dry mouth.  Muscle cramps.  Dark pee (urine). Pee may be the color of tea.  Your body making less pee.  Your eyes making fewer tears.  Heartbeat that is uneven or faster than normal (palpitations).  Headache.  Light-headedness, especially when you stand up from sitting.  Fainting (syncope). Symptoms of very bad dehydration may include:  Changes in skin, such as: ? Cold and clammy skin. ? Blotchy (mottled) or pale skin. ? Skin that does not quickly return to normal after being lightly pinched and let go (poor skin turgor).  Changes in body fluids, such as: ? Feeling very thirsty. ? Your eyes making fewer tears. ? Not sweating when body temperature is high, such as in hot weather. ? Your body making very little pee.  Changes in vital signs, such as: ? Weak pulse. ? Pulse that is more than 100 beats a minute when you are sitting still. ? Fast breathing. ? Low blood pressure.  Other changes, such as: ? Sunken eyes. ? Cold hands and feet. ? Confusion. ? Lack of energy (lethargy). ? Trouble waking up from sleep. ? Short-term weight loss. ? Unconsciousness. Follow these instructions at home:  If told by your doctor, drink an ORS: ? Make an ORS by using instructions on the package. ? Start by drinking small amounts, about  cup (120 mL) every 5-10 minutes. ? Slowly drink more until you have had the amount that your doctor said to have.  Drink enough clear fluid to keep your pee clear or pale yellow. If you were told to drink an ORS, finish the ORS  first, then start slowly drinking clear fluids. Drink fluids such as: ? Water. Do not drink only water by itself. Doing that can make the salt (sodium) level in your body get too low (hyponatremia). ? Ice chips. ? Fruit juice that you have added water to (diluted). ? Low-calorie sports drinks.  Avoid: ? Alcohol. ? Drinks that have a lot of sugar. These include high-calorie sports drinks, fruit juice that does not have water added, and soda. ? Caffeine. ? Foods that are greasy or have a lot of fat or sugar.  Take over-the-counter and prescription medicines only as told by your doctor.  Do not take salt tablets. Doing that can make the salt level in your body get too high (hypernatremia).  Eat foods that have minerals (electrolytes). Examples include bananas, oranges, potatoes, tomatoes, and spinach.  Keep all follow-up visits as told by your doctor. This is important. Contact a doctor if:  You have belly (abdominal) pain that: ? Gets worse. ? Stays in one area (localizes).  You have a rash.  You have a stiff neck.  You get angry or annoyed more easily than normal (irritability).  You are more sleepy than normal.  You have a harder time waking up than normal.  You feel: ? Weak. ? Dizzy. ? Very thirsty.  You have peed (urinated) only a small amount of very dark pee during 6-8 hours. Get help right away if:  You have symptoms of   very bad dehydration.  You cannot drink fluids without throwing up (vomiting).  Your symptoms get worse with treatment.  You have a fever.  You have a very bad headache.  You are throwing up or having watery poop (diarrhea) and it: ? Gets worse. ? Does not go away.  You have blood or something green (bile) in your throw-up.  You have blood in your poop (stool). This may cause poop to look black and tarry.  You have not peed in 6-8 hours.  You pass out (faint).  Your heart rate when you are sitting still is more than 100 beats a  minute.  You have trouble breathing. This information is not intended to replace advice given to you by your health care provider. Make sure you discuss any questions you have with your health care provider. Document Released: 06/08/2009 Document Revised: 03/01/2016 Document Reviewed: 10/06/2015 Elsevier Interactive Patient Education  2018 [employer]  

## 2018-03-16 NOTE — Telephone Encounter (Signed)
Patient is here and asking for treatment and see Kale. Will inform the RN for [place] today. Patient is in the lobby waiting. Per 7/22 los

## 2018-03-17 ENCOUNTER — Telehealth: Payer: Self-pay | Admitting: Radiation Oncology

## 2018-03-17 NOTE — Telephone Encounter (Signed)
I called and spoke with the patient and let her know that Dr. Lisbeth Renshaw was ok with her last CT brain and that we could hold off on her next MRI brain until October, but she will let us know if she has any symptoms of concern sooner.

## 2018-03-20 ENCOUNTER — Other Ambulatory Visit: Payer: Self-pay | Admitting: *Deleted

## 2018-03-20 NOTE — Progress Notes (Signed)
HEMATOLOGY/ONCOLOGY CLINIC NOTE  Date of Service: 03/23/18  Patient Care Team: [facility], Modena Nunnery, MD as PCP - General (Family Medicine)  CHIEF COMPLAINTS/PURPOSE OF CONSULTATION:   F/u for continued management of extensive stage small cell lung cancer. hyponatremia  HISTORY OF PRESENTING ILLNESS:   Haley Haley [name] is Haley Haley [name] 67 y.o. female who has been referred to [place] by Orthopedists Haley Haley [name] for evaluation and management of Bone Marrow Abnormality. Her PCP is Dr. Buelah [name].   She presents to the clinic today accompanied by her sons and her daughter-in-law. She notes she has several issues since October 2018.  She had colitis in 05/2017 with significant diarrhea and abdominal cramping --now mostly resolved.  She was treated with antibiotics. Afterward she had bronchitis. She was given albuterol and continues to take it as she has remaining cough (smoking contributed to this).   On 08/07/17 she notes she has back pain in her lower back the past couple of months. 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 Next day she saw her physician, who had done Haley [name] xray and told her she had arthritis. She was treated with prednisone and gabapentin. After no back pain improvements on treatment, she has yet to return to work. She went to orthopedist and had Haley [name] Lumbar MRI on 09/25/17. This shows Haley [name] compression fracture of the L2 with abnormalities in her bone marrow.   She quit smoking 09/24/17. She had been smoking 1 pack Haley [name] day for 50 years, since she was 67 yo. She still has cough from smoking but not as significant.   She denies heart conditions or disease, GI issues or diseases or other significant medical issues. DM runs in her family but no known cancers, blood disorders, or gene mutations. Prior to her health in the past 6 months she has lead Haley [name] independent overall healthy life. Her last mammogram in 04/2017 was negative. Last colonoscopy in 2015 and last Papsmear in 2017,  were negative. She had Haley [name] bone density scan in 2009. She has never been on HR therapy.   Given her significant pain (up to 10/10). She currently takes hydrocodone 1 tablet q4-5hours. This only reduced her pain to 7-8/10. She takes Tizanidine 75m q5-6hours. She has Haley [name] back brace that she does not use.   On review of symptoms, pt notes ongoing b/l hip pain, lower back pain which can radiate up the side and down her legs and buttocks. The pain can keep her up at night. She notes when she lays on her right side, her throat pain which causes her to cough. This started around time of bronchitis and has not improved. When she does cough she produces clear phlegm.  Interval History:   [name] here for Haley [name] scheduled follow-up of her extensive stage metastatic small cell lung cancer. The patient's last visit with Haley Haley [name] on 02/16/18. She is accompanied today by her sons. The pt reports that she is doing well overall.   In the interim the pt saw Haley Haley [name] in our symptom management on 03/16/18 for returned nausea. The pt reports that she began taking Reglan after seeing Haley Haley [name] but notes that the Reglan worsened some of her symptoms of nausea. She notes that she feels weak and tired, and is not able to eat as well as her baseline. She notes that her food consumption is limited by continued upper abdominal discomfort and pain. She also notes that she is not able to  do very much physically as she feels weak and tired.   The pt continues on Miralax every third day, and stool softeners daily which keep her bowel movements regular. She also continues on 68m Hydrocortisone in the morning and 534mHydrocortisone with lunch.   The pt notes that her endocrinology visit has been scheduled for September.  The pt notes that she hasn't been taking her anxiety medication recently, Compazine, and notes that some of her symptoms may be correlated with anxiety.   She also notes that she took her last Oxycontin this  morning, but is having trouble having insurance provide assistance with the refill. She notes that her pain has been very well controlled and is improved overall. She agrees to my suggestion of switching to Oxycodone as needed at this time.   Of note since the patient's last visit, pt has had CT Haley [name]/P completed on 03/02/18 with results revealing No specific findings identified to explain patient's acute abdominal pain. 2. Bilateral pleural effusions and mild interstitial edema may reflect CHF. 3. Right hilar lung mass with post obstructive pneumonitis of the right lower lobe is again noted. 4. Biliary stent remains in place with pneumobilia reflecting biliary patency. 5. Liver metastasis as described on 02/25/2018. 6. New small volume of free fluid within the pelvis. Nonspecific. 7. Widespread bone metastasis with pathologic fracture involving the L2 vertebral body as noted previously.  Lab results today (03/23/18) of CBC w/diff, CMP is as follows: all values are WNL except for RDW at 16.4, Sodium at 129, Chloride at 96, Glucose at 120, Calcium at 8.7, Albumin at 3.4, AST at 59, ALT at 76, Alk Phos at 352. Magnesium 03/23/18 is 1.9   On review of systems, pt reports feeling weak and tired, continued upper abdominal discomfort and pain, regular bowel movements, and denies constipation, diarrhea, skin rashes, leg swelling, headaches, and any other symptoms.    MEDICAL HISTORY:  Past Medical History:  Diagnosis Date  . Allergy   . Back pain   . Cancer ([place]  . Osteopenia     SURGICAL HISTORY: Past Surgical History:  Procedure Laterality Date  . COLONOSCOPY    . COLONOSCOPY WITH PROPOFOL N/Haley [name] 10/25/2014   Procedure: COLONOSCOPY WITH PROPOFOL;  Surgeon: MaGarlan FairMD;  Location: [facility];  Service: Endoscopy;  Laterality: N/Haley [name];  . ERCP N/Haley [name] 10/15/2017   Procedure: ENDOSCOPIC RETROGRADE CHOLANGIOPANCREATOGRAPHY (ERCP);  Surgeon: MaClarene EssexMD;  [facility];  Service: Endoscopy;   Laterality: N/Haley [name];  . IR FLUORO GUIDE PORT INSERTION RIGHT  11/10/2017  . IR RADIOLOGIST EVAL & MGMT  10/02/2017  . IR [place] VASC ACCESS RIGHT  11/10/2017    SOCIAL HISTORY: Social History   Socioeconomic History  . Marital status: Single    Spouse name: Not on file  . Number of children: Not on file  . Years of education: Not on file  . Highest education level: Not on file  Occupational History  . Not on file  Social Needs  . Financial resource strain: Not on file  . Food insecurity:    Worry: Not on file    Inability: Not on file  . Transportation needs:    Medical: Not on file    Non-medical: Not on file  Tobacco Use  . Smoking status: Former Smoker    Packs/day: 1.00    Years: 40.00    Pack years: 40.00    Types: Cigarettes    Last attempt to quit: 09/24/2017  Years since quitting: 0.4  . Smokeless tobacco: Never Used  Substance and Sexual Activity  . Alcohol use: Yes    Comment: rare social  . Drug use: No  . Sexual activity: Not Currently  Lifestyle  . Physical activity:    Days per week: Not on file    Minutes per session: Not on file  . Stress: Not on file  Relationships  . Social connections:    Talks on phone: Not on file    Gets together: Not on file    Attends religious service: Not on file    Active member of club or organization: Not on file    Attends meetings of clubs or organizations: Not on file    Relationship status: Not on file  . Intimate partner violence:    Fear of current or ex partner: Not on file    Emotionally abused: Not on file    Physically abused: Not on file    Forced sexual activity: Not on file  Other Topics Concern  . Not on file  Social History Narrative  . Not on file    FAMILY HISTORY: Family History  Problem Relation Age of Onset  . Diabetes Mother   . Hypertension Mother   . Diabetes Sister   . Hypertension Sister   . Hyperlipidemia Sister     ALLERGIES:  has No Known Allergies.  MEDICATIONS:  Current  Outpatient Medications  Medication Sig Dispense Refill  . calcium carbonate (OS-CAL - DOSED IN MG OF ELEMENTAL CALCIUM) 1250 (500 Ca) MG tablet Take 1 tablet by mouth daily.     Marland Kitchen docusate sodium (COLACE) 100 MG capsule Take 1 capsule (100 mg total) by mouth 2 (two) times daily. 60 capsule 0  . hydrocortisone (CORTEF) 10 MG tablet Take 2 tablets (20 mg total) by mouth daily with breakfast. And 78m po daily with lunch. 90 tablet 1  . lactulose (CEPHULAC) 10 g packet Take 1 packet (10 g total) by mouth 3 (three) times daily as needed (constipation). (Patient not taking: Reported on 03/11/2018) 30 each 0  . lidocaine-prilocaine (EMLA) cream Apply to affected area once 30 g 3  . LORazepam (ATIVAN) 0.5 MG tablet Take 1 tablet (0.5 mg total) by mouth every 6 (six) hours as needed (Nausea or vomiting). (Patient not taking: Reported on 03/11/2018) 30 tablet 0  . magic mouthwash w/lidocaine SOLN Take 10 mLs by mouth 4 (four) times daily as needed for mouth pain. Rinse and spit or swallow (Patient not taking: Reported on 03/11/2018) 240 mL 2  . metoCLOPramide (REGLAN) 10 MG tablet Take 1 tablet (10 mg total) by mouth every 8 (eight) hours as needed for nausea. 45 tablet 1  . Multiple Vitamins-Minerals (MULTIVITAMIN WITH MINERALS) tablet Take 1 tablet by mouth daily.    . ondansetron (ZOFRAN) 4 MG tablet Take 1 tablet (4 mg total) by mouth every 8 (eight) hours as needed for nausea or vomiting. (Patient not taking: Reported on 03/11/2018) 20 tablet 0  . oxyCODONE (OXY IR/ROXICODONE) 5 MG immediate release tablet Take 1-2 tablets (5-10 mg total) by mouth every 4 (four) hours as needed for moderate pain or severe pain. 60 tablet 0  . polyethylene glycol (MIRALAX) packet Take 17 g by mouth daily as needed for moderate constipation. 30 each 0  . prochlorperazine (COMPAZINE) 10 MG tablet Take 1 tablet (10 mg total) by mouth every 6 (six) hours as needed (Nausea or vomiting). 30 tablet 1  . senna-docusate (SENNA S)  8.6-50  MG tablet Take 2 tablets by mouth at bedtime. (Patient taking differently: Take 2 tablets by mouth at bedtime as needed for mild constipation or moderate constipation. ) 60 tablet 1   Current Facility-Administered Medications  Medication Dose Route Frequency Provider Last Rate Last Dose  . 0.9 %  sodium chloride infusion   Intravenous Continuous Brunetta Genera, MD       Facility-Administered Medications Ordered in Other Visits  Medication Dose Route Frequency Provider Last Rate Last Dose  . acetaminophen (TYLENOL) tablet 650 mg  650 mg Oral Once Curt Bears, MD      . diphenhydrAMINE (BENADRYL) capsule 25 mg  25 mg Oral Once Curt Bears, MD        REVIEW OF SYSTEMS:   Haley [name] 10+ POINT REVIEW OF SYSTEMS WAS OBTAINED including neurology, dermatology, psychiatry, cardiac, respiratory, lymph, extremities, GI, GU, Musculoskeletal, constitutional, breasts, reproductive, HEENT.  All pertinent positives are noted in the HPI.  All others are negative.   PHYSICAL EXAMINATION: ECOG PERFORMANCE STATUS: 2 - Symptomatic, <50% confined to bed . Vitals:   03/23/18 0857  BP: (!) 90/59  Pulse: 96  Resp: 18  Temp: 97.6 F (36.4 C)  SpO2: 100%   Filed Weights   03/23/18 0857  Weight: 122 lb 3.2 oz (55.4 kg)   .Body mass index is 21.65 kg/m.  GENERAL:alert, in no acute distress and comfortable SKIN: no acute rashes, no significant lesions EYES: conjunctiva are pink and non-injected, sclera anicteric OROPHARYNX: MMM, no exudates, no oropharyngeal erythema or ulceration NECK: supple, no JVD LYMPH:  no palpable lymphadenopathy in the cervical, axillary or inguinal regions LUNGS: clear to auscultation b/l with normal respiratory effort HEART: regular rate & rhythm ABDOMEN:  normoactive bowel sounds , non tender, not distended. No palpable hepatosplenomegaly.  Extremity: no pedal edema PSYCH: alert & oriented x 3 with fluent speech NEURO: no focal motor/sensory deficits     LABORATORY DATA:   . CBC Latest Ref Rng & Units 03/23/2018 03/16/2018 03/03/2018  WBC 3.9 - 10.3 K/uL 5.9 6.5 8.5  Hemoglobin 11.6 - 15.9 g/dL 12.2 12.8 9.8(L)  Hematocrit 34.8 - 46.6 % 36.1 39.0 29.5(L)  Platelets 145 - 400 K/uL 342 388 262  HGB 9.6 . CMP Latest Ref Rng & Units 03/23/2018 03/16/2018 03/03/2018  Glucose 70 - 99 mg/dL 120(H) 115(H) 65(L)  BUN 8 - 23 mg/dL 9 6(L) 6(L)  Creatinine 0.44 - 1.00 mg/dL 0.61 0.62 0.44  Sodium 135 - 145 mmol/L 129(L) 128(L) 137  Potassium 3.5 - 5.1 mmol/L 3.7 4.7 3.6  Chloride 98 - 111 mmol/L 96(L) 97(L) 108  CO2 22 - 32 mmol/L 25 24 20(L)  Calcium 8.9 - 10.3 mg/dL 8.7(L) 8.5(L) 7.0(L)  Total Protein 6.5 - 8.1 g/dL 7.0 7.2 5.5(L)  Total Bilirubin 0.3 - 1.2 mg/dL 0.8 0.9 2.3(H)  Alkaline Phos 38 - 126 U/L 352(H) 193(H) 312(H)  AST 15 - 41 U/L 59(H) 26 37  ALT 0 - 44 U/L 76(H) 26 72(H)       10/17/17 Liver needle/core biopsy   Fine Needle Aspiration, EUS, Pancreas3/12/2017 [facility] Result Narrative  ACCESSION NUMBER: [id number] RECEIVED: 10/24/2017 ORDERING PHYSICIAN: Harley Hallmark , MD PATIENT NAME: Haley Haley [name] CYTOLOGY REPORT  Final Cytologic Interpretation  Pancreas, endoscopic ultrasound-guided, fine needle aspiration II (smears and cell block): 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  Specimen Adequacy:Satisfactory for evaluation.    COMMENT:The smears show clusters of neoplastic cells with neuroendocrine nuclear features and scant cytoplasm.  Immunoperoxidase studies on limited cell block material show the neoplastic cells stain positive for synaptophysin, CD56, cytokeratin AE1/AE3 (Golgi-dot like staining) and TTF-1. The MIB-1 shows Haley [name] high proliferation index with positive staining in approximately 80% of the cells. Together with the cytomorphology this supports the diagnosis of small cell carcinoma. Given the presence of Haley [name] suspected right infrahilar  lung mass on MRI per [facility] and Haley [name] pancreatic mass, I cannot be certain of the site of origin and whether this represents Haley [name] primary pancreatic small cell poorly differentiated endocrine carcinoma versus Haley [name] metastatic small cell carcinoma of lung origin (both can express TTF-1). Clinical correlation advised.  Dr. Romilda Garret has reviewed the case in consultation and agrees with the diagnosis of small cell carcinoma.   The positive controls worked appropriately   "These tests were developed and their performance characteristics determined by [facility].They have not been cleared or approved by the U.S. Food and Drug Administration.The FDA has determined that such clearance or approval is not necessary.These tests are used for clinical purposes.They should not be regarded as investigational or for research.This laboratory is certified under the [place] (CLIA) as qualified to perform high complexity clinical laboratory testing."   Report Prepared By:O.Haley [name]. Sheryle Hail, M.D.  I have personally reviewed the slides and/or other related materials referenced, and have edited the report as part of my pathologic assessment and final interpretation.  Electronically Signed Out By: Blythe Stanford , MD 10/28/2017 20:55:03  sb/oah  Specimen(s) Received:  Pancreas, endoscopic ultrasound-guided, fine needle aspiration II (smears and cell block)  Clinical History obstructive jaundice  Preliminary (on-site) Interpretation  Adequate. L.Cox CT(ASCP) 10/24/17     RADIOGRAPHIC STUDIES: I have personally reviewed the radiological images as listed and agreed with the findings in the report. Ct Head Wo Contrast  Result Date: 02/24/2018 CLINICAL DATA:  67 year old female with acute headache and vomiting. Known metastatic lung cancer. EXAM: CT HEAD WITHOUT CONTRAST TECHNIQUE: Contiguous axial images  were obtained from the base of the skull through the vertex without intravenous contrast. COMPARISON:  01/12/2018 MR and prior studies FINDINGS: Brain: Small cerebral metastases identified on recent MR are difficult to visualize on CT today. No discrete mass or mass effect, infarct, hemorrhage, midline shift, extra-axial collection or hydrocephalus noted. Mild chronic small-vessel white matter ischemic changes noted. Vascular: Mild intracranial atherosclerotic calcifications noted. Skull: Normal. Negative for fracture or focal lesion. Sinuses/Orbits: Fluid within the LEFT sphenoid sinus again noted. Other: None. IMPRESSION: 1. No evidence of acute intracranial abnormality. Small cerebral metastases identified on 01/12/2018 MR are not well visualized by CT. 2. Mild chronic small-vessel white matter ischemic changes. Electronically Signed   By: Margarette Canada M.D.   On: 02/24/2018 21:28   Ct Abdomen Pelvis W Contrast  Result Date: 03/02/2018 CLINICAL DATA:  Abdominal pain, history of small cell lung cancer. EXAM: CT ABDOMEN AND PELVIS WITH CONTRAST TECHNIQUE: Multidetector CT imaging of the abdomen and pelvis was performed using the standard protocol following bolus administration of intravenous contrast. CONTRAST:  128m ISOVUE-300 IOPAMIDOL (ISOVUE-300) INJECTION 61%, 151mISOVUE-300 IOPAMIDOL (ISOVUE-300) INJECTION 61% COMPARISON:  12/26/2017 FINDINGS: Lower chest: Small bilateral pleural effusions are again noted. Right lower lobe lung mass with postobstructive atelectasis and consolidation is again identified.Superimposed interstitial thickening noted which may reflect pulmonary edema. Hepatobiliary: There is no acute liver findings. Haley [name] stent is identified within the common bile duct. Pneumobilia is identified reflecting biliary patency. Liver metastasis within the posterior right lobe measures 1  cm, image 28/2. Unchanged from previous exam. Segment 4b liver metastasis measures 1.1 cm, image 39/2. Previously 1 cm.  Diffuse periportal edema is identified. Mild gallbladder wall edema noted. No gallstones or pericholecystic fluid identified. Pancreas: Unremarkable appearance of the pancreas. Pancreatic head mass is no longer measurable. Spleen: Spleen normal. Adrenals/Urinary Tract: Unremarkable appearance of the adrenal glands. Kidneys are normal. Urinary bladder appears within normal limits. Stomach/Bowel: Stomach normal. The small bowel loops have Haley [name] normal course and caliber without obstruction. The appendix is visualized and appears normal. The colon is negative. Vascular/Lymphatic: Aortic atherosclerosis without aneurysm. No upper abdominal adenopathy. No pelvic or inguinal adenopathy. Reproductive: Uterus and bilateral adnexa are unremarkable. Other: There is Haley [name] small volume of free fluid identified within the pelvis. New from previous PET-CT dated 12/26/2017. Musculoskeletal: Widespread sclerotic bone metastases are identified. Again noted is Haley [name] vertically oriented pathologic fracture involving the L2 vertebral body with retropulsion and spinal canal encroachment as demonstrated previously, image 63/6. IMPRESSION: 1. No specific findings identified to explain patient's acute abdominal pain. 2. Bilateral pleural effusions and mild interstitial edema may reflect CHF. 3. Right hilar lung mass with post obstructive pneumonitis of the right lower lobe is again noted. 4. Biliary stent remains in place with pneumobilia reflecting biliary patency. 5. Liver metastasis as described on 02/25/2018. 6. New small volume of free fluid within the pelvis.  Nonspecific. 7. Widespread bone metastasis with pathologic fracture involving the L2 vertebral body as noted previously. Electronically Signed   By: Kerby Moors M.D.   On: 03/02/2018 17:01   Mr 3d Recon At Scanner  Result Date: 03/02/2018 CLINICAL DATA:  Nonspecific (abnormal) findings on radiological and other examination of musculoskeletal sysem. EXAM: 3-DIMENSIONAL MR IMAGE  RENDERING ON AQUISITION WORKSTATION TECHNIQUE: 3-dimensional MR images were rendered by post-processing of the original MR data on an aquisition workstation. The 3-dimensional MR images were interpreted and findings were reported in the accompanying complete MR report for this study. Please see accession number [id number] for Dr. Kathie Dike report on this case. Electronically Signed   By: Van Clines M.D.   On: 03/02/2018 06:40   Dg Chest Portable 1 View  Result Date: 02/24/2018 CLINICAL DATA:  Acute shortness of breath and fatigue. EXAM: PORTABLE CHEST 1 VIEW COMPARISON:  12/26/2017 PET CT, 12/19/2017 chest radiograph and priors FINDINGS: The cardiomediastinal silhouette is unremarkable. Haley [name] RIGHT Port-Haley [name]-Cath is again noted with tip overlying the mid SVC. RIGHT basilar atelectasis/scarring again noted. There is no evidence of focal airspace disease, pulmonary edema, suspicious pulmonary nodule/mass, pleural effusion, or pneumothorax. No acute bony abnormalities are identified. IMPRESSION: No evidence of acute cardiopulmonary disease. Electronically Signed   By: Margarette Canada M.D.   On: 02/24/2018 19:57   Dg Abdomen Acute W/chest  Result Date: 03/01/2018 CLINICAL DATA:  Weakness, nausea, vomiting, small cell lung cancer EXAM: DG ABDOMEN ACUTE W/ 1V CHEST COMPARISON:  02/24/2018 chest radiograph. Abdominal sonogram from earlier today. 12/26/2017 PET-CT. 02/25/2018 MRI abdomen. FINDINGS: Right internal jugular MediPort terminates in the lower third of the SVC. Stable cardiomediastinal silhouette with normal heart size. No pneumothorax. Stable slight blunting of the costophrenic angles bilaterally. No pulmonary edema. Mild streaky left lung base opacity, new. No acute consolidative airspace disease. CBD stent is in the expected location in the medial right upper abdomen. No dilated small bowel loops or air-fluid levels. Minimal gas and stool in the large bowel. No evidence of pneumatosis or  pneumoperitoneum. No radiopaque nephrolithiasis. IMPRESSION: 1. Mild streaky left lung base opacity, favor mild  atelectasis. 2. Stable slight blunting of the bilateral costophrenic angles, favor minimal pleural-parenchymal scarring, cannot exclude trace effusions. 3. Nonobstructive bowel gas pattern.  CBD stent in place. Electronically Signed   By: Ilona Sorrel M.D.   On: 03/01/2018 15:17   Mr Abdomen Mrcp Moise Boring Contast  Result Date: 02/25/2018 CLINICAL DATA:  Metastatic pancreatic cancer with biliary stent. Elevated liver function studies. EXAM: MRI ABDOMEN WITHOUT AND WITH CONTRAST (INCLUDING MRCP) TECHNIQUE: Multiplanar multisequence MR imaging of the abdomen was performed both before and after the administration of intravenous contrast. Heavily T2-weighted images of the biliary and pancreatic ducts were obtained, and three-dimensional MRCP images were rendered by post processing. CONTRAST:  22m MULTIHANCE GADOBENATE DIMEGLUMINE 529 MG/ML IV SOLN COMPARISON:  MRI abdomen 10/15/2017 and PET-CT 12/26/2017 FINDINGS: Lower chest: Persistent right basilar airspace consolidation. No pleural effusions. No pericardial effusion. No obvious pulmonary lesions. Hepatobiliary: There are only Haley [name] few tiny residual hepatic lesions. There was fairly widespread metastatic disease on the prior MRI which shows Haley [name] remarkable improvement. Persistent intra and extrahepatic biliary dilatation. The biliary stent is in good position. No obvious occlusion. The gallbladder is mildly distended.  Layering sludge is noted. Pancreas: The pancreatic head mass is not well demonstrated. This represents Haley [name] significant change when compared to the prior study. Haley [name] few small scattered peripancreatic lymph nodes are noted but no mass or progressive adenopathy. Spleen: Normal size. No focal lesions. The splenic and portal veins are patent. Adrenals/Urinary Tract: The adrenal glands and kidneys are unremarkable and stable. Stomach/Bowel: Visualized  portions within the abdomen are unremarkable. Vascular/Lymphatic: The aorta and branch vessels are patent. The major venous structures are patent. Other:  No ascites or abdominal wall hernia. Musculoskeletal: Numerous bony lesions are noted on the T2 weighted images and some of these demonstrate mild enhancement. Findings suggest treated metastasis. Compression fracture of L2 with moderate stable spinal canal encroachment. IMPRESSION: 1. Significant interval improvement since the prior MR examination. The pancreatic head mass is difficult to identify and the hepatic metastatic disease is near completely resolved. 2. Stable intra and extrahepatic biliary dilatation. The common bile duct stent appears to be in good position without complicating features. 3. Mild distention of the gallbladder with some layering sludge. 4. Numerous metastatic bone lesions with minimal enhancement suggesting improving/treated metastatic disease. 5. Persistent right basilar airspace opacity. Electronically Signed   By: PMarijo SanesM.D.   On: 02/25/2018 20:37   UKoreaAbdomen Limited Ruq  Result Date: 03/01/2018 CLINICAL DATA:  Upper abdominal pain for 8 days. History of metastatic pancreatic carcinoma. EXAM: ULTRASOUND ABDOMEN LIMITED RIGHT UPPER QUADRANT COMPARISON:  MRCP 02/25/2018. FINDINGS: Gallbladder: There is some sludge and Haley [name] few small stones within the gallbladder. No gallbladder wall thickening or pericholecystic fluid is identified. Sonographer reports negative Murphy's sign. Common bile duct: Diameter: 12.2 cm with Haley [name] common bile duct stent in place. Liver: Intrahepatic biliary ductal dilatation is seen as identified on the prior exam. 1.6 cm lesion in the right hepatic lobe is compatible with Haley [name] metastatic deposit as seen on the prior exam. Portal vein is patent on color Doppler imaging with normal direction of blood flow towards the liver. IMPRESSION: No acute abnormality. Small volume of gallbladder sludge with Haley [name] few stones.  Negative for cholecystitis. Unchanged intra and extrahepatic biliary ductal dilatation with the common bile duct stent in place. Small lesion in the right lobe of the liver compatible with metastatic disease as seen on the prior exam. Electronically Signed   By: TInge Rise  M.D.   On: 03/01/2018 13:04    MRI Lumbar Spine 09/25/17  IMPRESSION:  1. Extensive marrow signal abnormality throughout the osseous structures of the visualized thoracolumbar spine, sacrum, and iliac bones concerning for metastases, multiple myeloma, or other nonspecific marrow infiltrative disorder. Suggest obtaining Haley [name] PET/CT to evaluate extent of disease/evaluate for Haley [name] primary tumor.  2. L2 compression fracture, concerning for pathologic fracture given the extensive marrow abnormality with approximately 40% vertebral body height loss and 8.46m of retropulsion resulting in moderate narrowing of the spinal canal. Haley [name] postcontrast MRI with fat saturation would help to evaluate the enhancements pattern of the bony disease and evaluate for potential abnormal soft issue extending beyond the cortex which is not definitively evident in this exam.  3. Moderate facet arthrophy with grade 1 anterolisthesis at L4-5.  Degenerative disc disease with moderate left foraminal stenosis at L5-S1.    ASSESSMENT & PLAN:   SHOLLIDAY SHEAFFERis Haley [name] 67y.o. caucasian female with   1. 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  Liver mets + Bone mets + adrenal mets Pancreatic mets- FNA confirmed MRI brain 10/29/2017 after this clinic visit showed concern for multiple asymptomatic subcentimeter brain mets. 11/28/17 Brain MRI which revealed stable lesions, and new nonenhancing lesions. I discussed this imaging study with Dr VUlis Riasour neuro-oncologist -- no area of critical brain mets that needs patient to  rush into WBRT at this time.  12/26/17 PET which revealed Marked improvement in the previously demonstrated widespread hypermetabolic metastatic disease. There is only mild residual hypermetabolic activity within some of the osseous lesions.   patient has completed 4 cycles of carboplatin/etoposide/Atezolizumab and completed -completed WBRT 30 Gy in 10 fractions 01/15/2018 - 01/29/2018  Plan: -Discussed pt labwork today, 03/23/18; blood counts are stable, some worsening of liver functions and sodium again.  -Will hold Atezolizumab while the pt is having continued abdominal pain/discomfort, and liver enzyme fluctuations -Recommended that the pt return to care with her PCP, Dr. DBuelah Manisfor management and evaluation of anxiety and GI symptoms  -Take Ativan as needed in the interim  -Will provide referral to behavioral health for anxiety evaluation -Will order one liter IVF today -Offered referral to PT which the pt would like to wait on, and will try to walk more each day in the inteirm  2. 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 PLAN -recommended patient consume more salty foods Will limit free water intake to 32 oz Haley [name] day -Increase to 271mHydrocortisone in the morning, and 1065mydrocortisone at lunch   3.s/p obstructive Jaundice due to biliary compression from pancreatic mass  -ERCP attempted by Dr MagWatt Climesnot successful -ERCP completed on 10/24/17 with Dr. RisHarley Hallmarkth much improved LFTs -Bx of  pancreatic lesion -consistent with small cell lung cancer.  5. Back pain  related to pathologic  L2 fracture (causing elevated alkaline phosphatase levels. -continue using back brace -will hold of on interventional procedure to L2 at this time. -Per patient's clinical presentation; her hip and leg pain is likely nerve pinching due to compression fractures.  -Opiate related constipation: advised increasing Senna S to twice Haley [name] day and using Miralax as well; one time OTC Magnesium Citrate if she still cannot move bowels.  -Will switch pt from Oxycontin to Oxycodone every 4-6 hours as needed    5. Smoking history, productive cough  Smoked for 50 years, 1 pack Haley [name] day  Plan: -Smoking cessation being followed at this time.  6. Moderate to severe protein malnutrition due to weight loss -resolved . Wt Readings from Last 3 Encounters:  03/23/18 122 lb 3.2 oz (55.4 kg)  03/16/18 123 lb 8 oz (56 kg)  03/11/18 124 lb 6.4 oz (56.4 kg)   7. Adrenal insufficiency -Increased Hydrocortisone from 73m to 223min the morning, and from 11m74mo 46m811mth lunch -Follow up with endocrinologist at September scheduled visit    Referral to behavioral health for disease related anxiety -evaluation and management IVF NS x 1 liter today Discontinue all future treatment appointments RTC with Dr KaleIrene Limbo4 weeks with labs    All of the patients questions were answered with apparent satisfaction. The patient knows to call the clinic with any problems, questions or concerns.   The total time spent in the appt was 40 minutes and more than 50% was on counseling and direct patient cares.    GautSullivan LoneMS AAHIVMS SCH [facility]/Oncology Physician [facility] LLCffice):       [phone] cell):  [phone]):           [phone] 10:05 AM  I, SchuBaldwin [place] acting as Haley [name] scriEducation administrator Dr KaleIrene Limbo..I Marland Kitchenave reviewed the above documentation for accuracy and completeness, and I agree with the above. .GauBrunetta Genera

## 2018-03-23 ENCOUNTER — Inpatient Hospital Stay: Payer: Medicare Other

## 2018-03-23 ENCOUNTER — Ambulatory Visit: Payer: Medicare Other

## 2018-03-23 ENCOUNTER — Inpatient Hospital Stay (HOSPITAL_BASED_OUTPATIENT_CLINIC_OR_DEPARTMENT_OTHER): Payer: Medicare Other | Admitting: Hematology

## 2018-03-23 ENCOUNTER — Inpatient Hospital Stay: Payer: Medicare Other | Admitting: Nutrition

## 2018-03-23 VITALS — BP 90/59 | HR 96 | Temp 97.6°F | Resp 18 | Ht 63.0 in | Wt 122.2 lb

## 2018-03-23 VITALS — BP 123/75 | HR 73 | Resp 18

## 2018-03-23 DIAGNOSIS — M4856XA Collapsed vertebra, not elsewhere classified, lumbar region, initial encounter for fracture: Secondary | ICD-10-CM

## 2018-03-23 DIAGNOSIS — E278 Other specified disorders of adrenal gland: Secondary | ICD-10-CM

## 2018-03-23 DIAGNOSIS — C3490 Malignant neoplasm of unspecified part of unspecified bronchus or lung: Secondary | ICD-10-CM

## 2018-03-23 DIAGNOSIS — C7951 Secondary malignant neoplasm of bone: Secondary | ICD-10-CM

## 2018-03-23 DIAGNOSIS — F419 Anxiety disorder, unspecified: Secondary | ICD-10-CM

## 2018-03-23 DIAGNOSIS — R112 Nausea with vomiting, unspecified: Secondary | ICD-10-CM

## 2018-03-23 DIAGNOSIS — Z79899 Other long term (current) drug therapy: Secondary | ICD-10-CM

## 2018-03-23 DIAGNOSIS — Z7189 Other specified counseling: Secondary | ICD-10-CM

## 2018-03-23 DIAGNOSIS — E860 Dehydration: Secondary | ICD-10-CM

## 2018-03-23 DIAGNOSIS — Z9221 Personal history of antineoplastic chemotherapy: Secondary | ICD-10-CM

## 2018-03-23 DIAGNOSIS — C7970 Secondary malignant neoplasm of unspecified adrenal gland: Secondary | ICD-10-CM

## 2018-03-23 DIAGNOSIS — F1190 Opioid use, unspecified, uncomplicated: Secondary | ICD-10-CM

## 2018-03-23 DIAGNOSIS — Z87891 Personal history of nicotine dependence: Secondary | ICD-10-CM

## 2018-03-23 DIAGNOSIS — C787 Secondary malignant neoplasm of liver and intrahepatic bile duct: Secondary | ICD-10-CM

## 2018-03-23 DIAGNOSIS — E871 Hypo-osmolality and hyponatremia: Secondary | ICD-10-CM

## 2018-03-23 DIAGNOSIS — E2740 Unspecified adrenocortical insufficiency: Secondary | ICD-10-CM

## 2018-03-23 DIAGNOSIS — E273 Drug-induced adrenocortical insufficiency: Secondary | ICD-10-CM

## 2018-03-23 DIAGNOSIS — R945 Abnormal results of liver function studies: Secondary | ICD-10-CM

## 2018-03-23 DIAGNOSIS — K5903 Drug induced constipation: Secondary | ICD-10-CM

## 2018-03-23 DIAGNOSIS — C7931 Secondary malignant neoplasm of brain: Secondary | ICD-10-CM

## 2018-03-23 LAB — CMP (CANCER CENTER ONLY)
ALBUMIN: 3.4 g/dL — AB (ref 3.5–5.0)
ALT: 76 U/L — ABNORMAL HIGH (ref 0–44)
ANION GAP: 8 (ref 5–15)
AST: 59 U/L — ABNORMAL HIGH (ref 15–41)
Alkaline Phosphatase: 352 U/L — ABNORMAL HIGH (ref 38–126)
BUN: 9 mg/dL (ref 8–23)
CALCIUM: 8.7 mg/dL — AB (ref 8.9–10.3)
CHLORIDE: 96 mmol/L — AB (ref 98–111)
CO2: 25 mmol/L (ref 22–32)
Creatinine: 0.61 mg/dL (ref 0.44–1.00)
GFR, Estimated: >60 mL/min (ref >60)
Glucose, Bld: 120 mg/dL — ABNORMAL HIGH (ref 70–99)
POTASSIUM: 3.7 mmol/L (ref 3.5–5.1)
SODIUM: 129 mmol/L — AB (ref 135–145)
Total Bilirubin: 0.8 mg/dL (ref 0.3–1.2)
Total Protein: 7 g/dL (ref 6.5–8.1)

## 2018-03-23 LAB — CBC WITH DIFFERENTIAL/PLATELET
Basophils Absolute: 0 10*3/uL (ref 0.0–0.1)
Basophils Relative: 1 %
Eosinophils Absolute: 0.1 10*3/uL (ref 0.0–0.5)
Eosinophils Relative: 3 %
HCT: 36.1 % (ref 34.8–46.6)
HEMOGLOBIN: 12.2 g/dL (ref 11.6–15.9)
LYMPHS PCT: 19 %
Lymphs Abs: 1.1 10*3/uL (ref 0.9–3.3)
MCH: 30.3 pg (ref 25.1–34.0)
MCHC: 33.7 g/dL (ref 31.5–36.0)
MCV: 89.7 fL (ref 79.5–101.0)
MONOS PCT: 15 %
Monocytes Absolute: 0.9 10*3/uL (ref 0.1–0.9)
NEUTROS ABS: 3.7 10*3/uL (ref 1.5–6.5)
Neutrophils Relative %: 62 %
Platelets: 342 10*3/uL (ref 145–400)
RBC: 4.02 MIL/uL (ref 3.70–5.45)
RDW: 16.4 % — AB (ref 11.2–14.5)
WBC: 5.9 10*3/uL (ref 3.9–10.3)

## 2018-03-23 LAB — MAGNESIUM: MAGNESIUM: 1.9 mg/dL (ref 1.7–2.4)

## 2018-03-23 MED ORDER — SODIUM CHLORIDE 0.9% FLUSH
10.0000 mL | Freq: Once | INTRAVENOUS | Status: AC
  Administered 2018-03-23: 10 mL
  Filled 2018-03-23: qty 10

## 2018-03-23 MED ORDER — DENOSUMAB 120 MG/1.7ML ~~LOC~~ SOLN
120.0000 mg | Freq: Once | SUBCUTANEOUS | Status: AC
  Administered 2018-03-23: 120 mg via SUBCUTANEOUS

## 2018-03-23 MED ORDER — DENOSUMAB 120 MG/1.7ML ~~LOC~~ SOLN
SUBCUTANEOUS | Status: AC
  Filled 2018-03-23: qty 1.7

## 2018-03-23 MED ORDER — OXYCODONE HCL 5 MG PO TABS
5.0000 mg | ORAL_TABLET | ORAL | 0 refills | Status: DC | PRN
Start: 2018-03-23 — End: 2018-05-05

## 2018-03-23 MED ORDER — HEPARIN SOD (PORK) LOCK FLUSH 100 UNIT/ML IV SOLN
500.0000 [IU] | Freq: Once | INTRAVENOUS | Status: AC
  Administered 2018-03-23: 500 [IU]
  Filled 2018-03-23: qty 5

## 2018-03-23 MED ORDER — HYDROCORTISONE 10 MG PO TABS: 20.0000 mg | ORAL_TABLET | Freq: Every day | ORAL | 1 refills | Status: DC

## 2018-03-23 MED ORDER — SODIUM CHLORIDE 0.9 % IV SOLN
INTRAVENOUS | Status: AC
  Administered 2018-03-23: 11:00:00 via INTRAVENOUS
  Filled 2018-03-23 (×2): qty 250

## 2018-03-23 NOTE — Progress Notes (Signed)
Nutrition follow-up completed with patient during infusion for metastatic cancer. Weight has decreased dramatically and was documented as 122.2 pounds July 29 down from 131.3 pounds June 17. Labs noted: Sodium 129, glucose 120, albumin 3.4. Patient reports she is trying to eat smaller amounts but has frequent stomach pain. Also reports early satiety. She is finding it difficult to consume adequate fluids.  Nutrition diagnosis: Severe malnutrition continues.  Intervention: I educated patient to consume very high-calorie, high-protein foods 6-8 times daily. Reviewed easy to tolerate high-protein foods. Encourage patient to consume oral nutrition supplements throughout the day. Questions were answered.  Teach back method used.  Monitoring, evaluation, goals: Patient will work to increase calories and protein to minimize further weight loss.  Next visit: To be scheduled as needed.  **Disclaimer: This note was dictated with voice recognition software. Similar sounding words can inadvertently be transcribed and this note may contain transcription errors which may not have been corrected upon publication of note.**

## 2018-04-06 ENCOUNTER — Encounter: Payer: Self-pay | Admitting: Family Medicine

## 2018-04-06 ENCOUNTER — Other Ambulatory Visit: Payer: Self-pay

## 2018-04-06 ENCOUNTER — Ambulatory Visit (INDEPENDENT_AMBULATORY_CARE_PROVIDER_SITE_OTHER): Payer: Medicare Other | Admitting: Family Medicine

## 2018-04-06 VITALS — BP 102/62 | HR 66 | Temp 97.7°F | Resp 16 | Ht 63.0 in | Wt 123.0 lb

## 2018-04-06 DIAGNOSIS — R1084 Generalized abdominal pain: Secondary | ICD-10-CM

## 2018-04-06 DIAGNOSIS — E440 Moderate protein-calorie malnutrition: Secondary | ICD-10-CM

## 2018-04-06 DIAGNOSIS — C787 Secondary malignant neoplasm of liver and intrahepatic bile duct: Secondary | ICD-10-CM | POA: Diagnosis not present

## 2018-04-06 DIAGNOSIS — R6881 Early satiety: Secondary | ICD-10-CM

## 2018-04-06 MED ORDER — SUCRALFATE 1 GM/10ML PO SUSP: 1.0000 g | Freq: Three times a day (TID) | ORAL | 2 refills | Status: DC

## 2018-04-06 NOTE — Patient Instructions (Addendum)
Referral to GI specialist  Try the carafate with meals  F/U as previous

## 2018-04-06 NOTE — Assessment & Plan Note (Signed)
Her abdominal pain with early satiety is very concerning the setting of her metastatic disease.  There is no sign of any blockage on the scan she had a CT scan abdominal ultrasound and MR.  Medicine her to gastroenterology to see if scope needs to be performed.  She is having bowel movements in her nausea and vomiting have improved with the medications.  She is drinking protein supplements as much as she can tolerate.  We will have her try Carafate to coat the stomach to see if this helps and get her appointment with GI.  Cannot think of anything else to intervene on at this time.

## 2018-04-06 NOTE — Progress Notes (Signed)
Subjective:    Patient ID: Haley [name], female    DOB: 05-28-1951, 67 y.o.   MRN: [id number]  Patient presents for GI Issues (loss of appetite, discomfort in stomach)   She has had intermittant abdominal pain, decreased appetite for the past month  was admitted 1st week of July, CT of abdomen/pelvis.  States she saw a GI, told her she didn't have enough gas  She has been limited on fluids and food, she can only drink/eat  a few ounces and then she feels nausea she is lost about 10 pounds over the past month.  She has metastatic mall cell lung cancer with mets to the bone in the liver   CT abdomen/pelvis   1. No specific findings identified to explain patient's acute abdominal pain. 2. Bilateral pleural effusions and mild interstitial edema may reflect CHF. 3. Right hilar lung mass with post obstructive pneumonitis of the right lower lobe is again noted. 4. Biliary stent remains in place with pneumobilia reflecting biliary patency. 5. Liver metastasis as described on 02/25/2018. 6. New small volume of free fluid within the pelvis.  Nonspecific. 7. Widespread bone metastasis with pathologic fracture involving the L2 vertebral body as noted previously.   Taking Reglan as needed, taking zofran , no recent vomiting   Was taking occ ibuprofen , no blood in stool or urine No burning sensation in throat , was on pepcid  But stopped didn't think it was doing anything    Has appt with therapist for her depression    Review Of Systems:  GEN- + fatigue, fever, +weight loss,weakness, recent illness HEENT- denies eye drainage, change in vision, nasal discharge, CVS- denies chest pain, palpitations RESP- denies SOB, cough, wheeze ABD- + N/V, change in stools, +abd pain GU- denies dysuria, hematuria, dribbling, incontinence MSK- denies joint pain, muscle aches, injury Neuro- denies headache, dizziness, syncope, seizure activity       Objective:    BP 102/62   Pulse 66    Temp 97.7 F (36.5 C) (Oral)   Resp 16   Ht 5\' 3"  (1.6 m)   Wt 123 lb (55.8 kg)   SpO2 94%   BMI 21.79 kg/m  GEN- NAD, alert and oriented x3, chronically ill appearing  HEENT- PERRL, EOMI, non injected sclera, pink conjunctiva, MMM, oropharynx clear Neck- Supple, no LAD  CVS- RRR, no murmur RESP-CTAB ABD-NABS,soft, mild TTP lower quadrants, no rebound, no guarding  EXT- No edema Pulses- Radial, 2+        Assessment & Plan:      Problem List Items Addressed This Visit      Unprioritized   Generalized abdominal pain    Her abdominal pain with early satiety is very concerning the setting of her metastatic disease.  There is no sign of any blockage on the scan she had a CT scan abdominal ultrasound and MR.  Medicine her to gastroenterology to see if scope needs to be performed.  She is having bowel movements in her nausea and vomiting have improved with the medications.  She is drinking protein supplements as much as she can tolerate.  We will have her try Carafate to coat the stomach to see if this helps and get her appointment with GI.  Cannot think of anything else to intervene on at this time.      Liver metastases ([place]) - Primary   Malnutrition of moderate degree    Other Visit Diagnoses    Early satiety  Note: This dictation was prepared with Dragon dictation along with smaller phrase technology. Any transcriptional errors that result from this process are unintentional.

## 2018-04-12 ENCOUNTER — Emergency Department (HOSPITAL_COMMUNITY): Payer: Medicare Other

## 2018-04-12 ENCOUNTER — Encounter (HOSPITAL_COMMUNITY): Payer: Self-pay | Admitting: *Deleted

## 2018-04-12 ENCOUNTER — Inpatient Hospital Stay (HOSPITAL_COMMUNITY)
Admission: EM | Admit: 2018-04-12 | Discharge: 2018-04-18 | DRG: 871 | Disposition: A | Discharge status: Home Health | Payer: Medicare Other | Attending: Family Medicine | Admitting: Family Medicine

## 2018-04-12 ENCOUNTER — Other Ambulatory Visit: Payer: Self-pay

## 2018-04-12 DIAGNOSIS — K8309 Other cholangitis: Secondary | ICD-10-CM

## 2018-04-12 DIAGNOSIS — E43 Unspecified severe protein-calorie malnutrition: Secondary | ICD-10-CM | POA: Diagnosis present

## 2018-04-12 DIAGNOSIS — E278 Other specified disorders of adrenal gland: Secondary | ICD-10-CM | POA: Diagnosis present

## 2018-04-12 DIAGNOSIS — E871 Hypo-osmolality and hyponatremia: Secondary | ICD-10-CM | POA: Diagnosis present

## 2018-04-12 DIAGNOSIS — Z6821 Body mass index (BMI) 21.0-21.9, adult: Secondary | ICD-10-CM

## 2018-04-12 DIAGNOSIS — B961 Klebsiella pneumoniae [K. pneumoniae] as the cause of diseases classified elsewhere: Secondary | ICD-10-CM | POA: Diagnosis present

## 2018-04-12 DIAGNOSIS — E876 Hypokalemia: Secondary | ICD-10-CM | POA: Diagnosis present

## 2018-04-12 DIAGNOSIS — E222 Syndrome of inappropriate secretion of antidiuretic hormone: Secondary | ICD-10-CM | POA: Diagnosis present

## 2018-04-12 DIAGNOSIS — Z9221 Personal history of antineoplastic chemotherapy: Secondary | ICD-10-CM

## 2018-04-12 DIAGNOSIS — A419 Sepsis, unspecified organism: Secondary | ICD-10-CM | POA: Diagnosis not present

## 2018-04-12 DIAGNOSIS — D75839 Thrombocytosis, unspecified: Secondary | ICD-10-CM | POA: Diagnosis present

## 2018-04-12 DIAGNOSIS — R945 Abnormal results of liver function studies: Secondary | ICD-10-CM | POA: Diagnosis present

## 2018-04-12 DIAGNOSIS — Z87891 Personal history of nicotine dependence: Secondary | ICD-10-CM

## 2018-04-12 DIAGNOSIS — G893 Neoplasm related pain (acute) (chronic): Secondary | ICD-10-CM | POA: Diagnosis present

## 2018-04-12 DIAGNOSIS — M8448XA Pathological fracture, other site, initial encounter for fracture: Secondary | ICD-10-CM | POA: Diagnosis present

## 2018-04-12 DIAGNOSIS — Z79891 Long term (current) use of opiate analgesic: Secondary | ICD-10-CM

## 2018-04-12 DIAGNOSIS — I959 Hypotension, unspecified: Secondary | ICD-10-CM | POA: Diagnosis present

## 2018-04-12 DIAGNOSIS — A4152 Sepsis due to Pseudomonas: Secondary | ICD-10-CM | POA: Diagnosis not present

## 2018-04-12 DIAGNOSIS — R7881 Bacteremia: Secondary | ICD-10-CM

## 2018-04-12 DIAGNOSIS — C7951 Secondary malignant neoplasm of bone: Secondary | ICD-10-CM | POA: Diagnosis present

## 2018-04-12 DIAGNOSIS — C7970 Secondary malignant neoplasm of unspecified adrenal gland: Secondary | ICD-10-CM | POA: Diagnosis present

## 2018-04-12 DIAGNOSIS — E872 Acidosis, unspecified: Secondary | ICD-10-CM

## 2018-04-12 DIAGNOSIS — D473 Essential (hemorrhagic) thrombocythemia: Secondary | ICD-10-CM | POA: Diagnosis present

## 2018-04-12 DIAGNOSIS — G9341 Metabolic encephalopathy: Secondary | ICD-10-CM | POA: Diagnosis present

## 2018-04-12 DIAGNOSIS — R1084 Generalized abdominal pain: Secondary | ICD-10-CM | POA: Diagnosis present

## 2018-04-12 DIAGNOSIS — E7220 Disorder of urea cycle metabolism, unspecified: Secondary | ICD-10-CM | POA: Diagnosis present

## 2018-04-12 DIAGNOSIS — K831 Obstruction of bile duct: Secondary | ICD-10-CM | POA: Diagnosis present

## 2018-04-12 DIAGNOSIS — C787 Secondary malignant neoplasm of liver and intrahepatic bile duct: Secondary | ICD-10-CM | POA: Diagnosis present

## 2018-04-12 DIAGNOSIS — Z79899 Other long term (current) drug therapy: Secondary | ICD-10-CM

## 2018-04-12 DIAGNOSIS — F419 Anxiety disorder, unspecified: Secondary | ICD-10-CM | POA: Diagnosis present

## 2018-04-12 DIAGNOSIS — R930 Abnormal findings on diagnostic imaging of skull and head, not elsewhere classified: Secondary | ICD-10-CM | POA: Diagnosis present

## 2018-04-12 DIAGNOSIS — J90 Pleural effusion, not elsewhere classified: Secondary | ICD-10-CM | POA: Diagnosis present

## 2018-04-12 DIAGNOSIS — E279 Disorder of adrenal gland, unspecified: Secondary | ICD-10-CM

## 2018-04-12 DIAGNOSIS — C801 Malignant (primary) neoplasm, unspecified: Secondary | ICD-10-CM | POA: Diagnosis present

## 2018-04-12 DIAGNOSIS — C3490 Malignant neoplasm of unspecified part of unspecified bronchus or lung: Secondary | ICD-10-CM | POA: Diagnosis present

## 2018-04-12 HISTORY — DX: Malignant neoplasm of unspecified part of unspecified bronchus or lung: C34.90

## 2018-04-12 HISTORY — DX: Benign neoplasm of colon, unspecified: D12.6

## 2018-04-12 LAB — URINALYSIS, ROUTINE W REFLEX MICROSCOPIC
Bilirubin Urine: NEGATIVE
GLUCOSE, UA: 50 mg/dL — AB
HGB URINE DIPSTICK: NEGATIVE
Ketones, ur: 5 mg/dL — AB
Leukocytes, UA: NEGATIVE
Nitrite: NEGATIVE
PH: 7 (ref 5.0–8.0)
Protein, ur: NEGATIVE mg/dL
SPECIFIC GRAVITY, URINE: 1.008 (ref 1.005–1.030)

## 2018-04-12 LAB — CBC
HEMATOCRIT: 36.7 % (ref 36.0–46.0)
HEMOGLOBIN: 12.6 g/dL (ref 12.0–15.0)
MCH: 29.6 pg (ref 26.0–34.0)
MCHC: 34.3 g/dL (ref 30.0–36.0)
MCV: 86.4 fL (ref 78.0–100.0)
Platelets: 436 10*3/uL — ABNORMAL HIGH (ref 150–400)
RBC: 4.25 MIL/uL (ref 3.87–5.11)
RDW: 15.3 % (ref 11.5–15.5)
WBC: 13.8 10*3/uL — ABNORMAL HIGH (ref 4.0–10.5)

## 2018-04-12 LAB — COMPREHENSIVE METABOLIC PANEL
ALBUMIN: 3.1 g/dL — AB (ref 3.5–5.0)
ALT: 204 U/L — ABNORMAL HIGH (ref 0–44)
ANION GAP: 11 (ref 5–15)
AST: 282 U/L — ABNORMAL HIGH (ref 15–41)
Alkaline Phosphatase: 519 U/L — ABNORMAL HIGH (ref 38–126)
BUN: 9 mg/dL (ref 8–23)
CHLORIDE: 91 mmol/L — AB (ref 98–111)
CO2: 22 mmol/L (ref 22–32)
Calcium: 8.3 mg/dL — ABNORMAL LOW (ref 8.9–10.3)
Creatinine, Ser: 0.4 mg/dL — ABNORMAL LOW (ref 0.44–1.00)
GFR calc non Af Amer: >60 mL/min (ref >60)
Glucose, Bld: 199 mg/dL — ABNORMAL HIGH (ref 70–99)
POTASSIUM: 3.8 mmol/L (ref 3.5–5.1)
SODIUM: 124 mmol/L — AB (ref 135–145)
Total Bilirubin: 3.6 mg/dL — ABNORMAL HIGH (ref 0.3–1.2)
Total Protein: 7 g/dL (ref 6.5–8.1)

## 2018-04-12 LAB — I-STAT CG4 LACTIC ACID, ED: Lactic Acid, Venous: 2.48 mmol/L (ref 0.5–1.9)

## 2018-04-12 LAB — POC OCCULT BLOOD, ED: FECAL OCCULT BLD: NEGATIVE

## 2018-04-12 LAB — LIPASE, BLOOD: LIPASE: 26 U/L (ref 11–51)

## 2018-04-12 LAB — AMMONIA: AMMONIA: 58 umol/L — AB (ref 9–35)

## 2018-04-12 MED ORDER — SODIUM CHLORIDE 0.9 % IV BOLUS (SEPSIS)
500.0000 mL | Freq: Once | INTRAVENOUS | Status: AC
  Administered 2018-04-12: 500 mL via INTRAVENOUS

## 2018-04-12 MED ORDER — SODIUM CHLORIDE 0.9 % IV BOLUS (SEPSIS)
250.0000 mL | Freq: Once | INTRAVENOUS | Status: AC
  Administered 2018-04-12: 250 mL via INTRAVENOUS

## 2018-04-12 MED ORDER — SODIUM CHLORIDE 0.9 % IV SOLN
2.0000 g | Freq: Once | INTRAVENOUS | Status: AC
  Administered 2018-04-12: 2 g via INTRAVENOUS
  Filled 2018-04-12: qty 2

## 2018-04-12 MED ORDER — SODIUM CHLORIDE 0.9 % IV BOLUS (SEPSIS)
1000.0000 mL | Freq: Once | INTRAVENOUS | Status: AC
  Administered 2018-04-12: 1000 mL via INTRAVENOUS

## 2018-04-12 MED ORDER — IOPAMIDOL (ISOVUE-300) INJECTION 61%
INTRAVENOUS | Status: AC
  Filled 2018-04-12: qty 100

## 2018-04-12 MED ORDER — VANCOMYCIN HCL IN DEXTROSE 1-5 GM/200ML-% IV SOLN
1000.0000 mg | Freq: Once | INTRAVENOUS | Status: AC
  Administered 2018-04-12: 1000 mg via INTRAVENOUS
  Filled 2018-04-12: qty 200

## 2018-04-12 MED ORDER — METRONIDAZOLE IN NACL 5-0.79 MG/ML-% IV SOLN
500.0000 mg | Freq: Three times a day (TID) | INTRAVENOUS | Status: DC
  Administered 2018-04-12: 500 mg via INTRAVENOUS
  Filled 2018-04-12: qty 100

## 2018-04-12 MED ORDER — ONDANSETRON 4 MG PO TBDP: 4.0000 mg | ORAL_TABLET | Freq: Once | ORAL | Status: DC | PRN

## 2018-04-12 MED ORDER — IOPAMIDOL (ISOVUE-300) INJECTION 61%
100.0000 mL | Freq: Once | INTRAVENOUS | Status: AC | PRN
  Administered 2018-04-12: 80 mL via INTRAVENOUS

## 2018-04-12 MED ORDER — ACETAMINOPHEN 325 MG PO TABS
650.0000 mg | ORAL_TABLET | Freq: Once | ORAL | Status: AC
  Administered 2018-04-12: 650 mg via ORAL
  Filled 2018-04-12: qty 2

## 2018-04-12 NOTE — ED Notes (Signed)
Pt remains out of room in radiology at this time

## 2018-04-12 NOTE — Progress Notes (Signed)
A consult was received from an ED physician for Vancomycin, cefepime  per pharmacy dosing.  The patient's profile has been reviewed for ht/wt/allergies/indication/available labs.   A one time order has been placed for Vancomycin 1gm iv x1 and Cefepime 2gm iv x1.  Further antibiotics/pharmacy consults should be ordered by admitting physician if indicated.                       Thank you, Nani Skillern Crowford 04/12/2018  9:40 PM

## 2018-04-12 NOTE — ED Triage Notes (Signed)
Pt is a cancer pt, dx liver ca.  Last chemo x 2 wks ago.   Pt stated "My stomach started hurting this morning."  Pt c/o abd pain.

## 2018-04-12 NOTE — ED Provider Notes (Signed)
[facility] DEPT Provider Note   CSN: [id number] Arrival date & time: 04/12/18  2057     History   Chief Complaint Chief Complaint  Patient presents with  . Abdominal Pain    HPI Haley [name] is a 67 y.o. female. 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  History obtained from her son via telephone states that she was okay yesterday and earlier today.  She been again complaining of some pain earlier than the day today.  She became acutely weak approximately an hour ago and was brought to the ED via EMS.  Here she is uncommunicative. Past Medical History:  Diagnosis Date  . Allergy   . Back pain   . Cancer ([place])   . Osteopenia     Patient Active Problem List   Diagnosis Date Noted  . Abnormal liver function   . Generalized abdominal pain   . Malnutrition of moderate degree 03/02/2018  . Dehydration 03/01/2018  . Nausea & vomiting 03/01/2018  . Brain metastasis ([place]) 01/08/2018  . HCAP (healthcare-associated pneumonia) 12/19/2017  . 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 Bone metastasis ([place]) 10/22/2017  . Counseling regarding advanced care planning and goals of care 10/22/2017  . Liver metastases ([place])   . 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 Metastatic disease ([place])   . Jaundice 10/13/2017  . Mood disorder ([place]) 05/12/2017  . Osteoarthritis 05/12/2017  . Osteopenia 05/06/2016  . Eustachian tube dysfunction 07/03/2015  . Allergic rhinitis 07/03/2015  . Tobacco use disorder 07/03/2015  . Hyperlipidemia 07/03/2015    Past Surgical History:  Procedure Laterality Date  . COLONOSCOPY    .  COLONOSCOPY WITH PROPOFOL N/A 10/25/2014   Procedure: COLONOSCOPY WITH PROPOFOL;  Surgeon: Garlan Fair, MD;  Location: [facility] ENDOSCOPY;  Service: Endoscopy;  Laterality: N/A;  . ERCP N/A 10/15/2017   Procedure: ENDOSCOPIC RETROGRADE CHOLANGIOPANCREATOGRAPHY (ERCP);  Surgeon: Clarene Essex, MD;  Location: [facility];  Service: Endoscopy;  Laterality: N/A;  . IR FLUORO GUIDE PORT INSERTION RIGHT  11/10/2017  . IR RADIOLOGIST EVAL & MGMT  10/02/2017  . IR US GUIDE VASC ACCESS RIGHT  11/10/2017     OB History   None      Home Medications    Prior to Admission medications   Medication Sig Start Date End Date Taking? Authorizing Provider  calcium carbonate (OS-CAL - DOSED IN MG OF ELEMENTAL CALCIUM) 1250 (500 Ca) MG tablet Take 1 tablet by mouth daily.     [provider]  hydrocortisone (CORTEF) 10 MG tablet Take 2 tablets (20 mg total) by mouth daily with breakfast. And 10mg  po daily with lunch. 03/23/18   Brunetta Genera, MD  lidocaine-prilocaine (EMLA) cream Apply to affected area once 10/22/17   Brunetta Genera, MD  LORazepam (ATIVAN) 0.5 MG tablet Take 1 tablet (0.5 mg total) by mouth every 6 (six) hours as needed (Nausea or vomiting). 10/22/17   Brunetta Genera, MD  magic mouthwash w/lidocaine SOLN Take 10 mLs by mouth 4 (four) times daily as needed for mouth pain. Rinse and spit or swallow 12/08/17   Tanner, Lyndon Code., PA-C  metoCLOPramide (REGLAN) 10 MG tablet Take 1 tablet (10 mg total)  by mouth every 8 (eight) hours as needed for nausea. 03/16/18   Tanner, Lyndon Code., PA-C  Multiple Vitamins-Minerals (MULTIVITAMIN WITH MINERALS) tablet Take 1 tablet by mouth daily.    [provider]  ondansetron (ZOFRAN) 4 MG tablet Take 1 tablet (4 mg total) by mouth every 8 (eight) hours as needed for nausea or vomiting. 10/18/17   Bonnielee Haff, MD  oxyCODONE (OXY IR/ROXICODONE) 5 MG immediate release tablet Take 1-2 tablets (5-10 mg total) by mouth every 4 (four) hours as needed  for moderate pain or severe pain. 03/23/18   Brunetta Genera, MD  polyethylene glycol [facility]) packet Take 17 g by mouth daily as needed for moderate constipation. 10/18/17   Bonnielee Haff, MD  prochlorperazine (COMPAZINE) 10 MG tablet Take 1 tablet (10 mg total) by mouth every 6 (six) hours as needed (Nausea or vomiting). 10/22/17   Brunetta Genera, MD  senna-docusate (SENNA S) 8.6-50 MG tablet Take 2 tablets by mouth at bedtime. Patient taking differently: Take 2 tablets by mouth at bedtime as needed for mild constipation or moderate constipation.  09/30/17   Brunetta Genera, MD  sucralfate (CARAFATE) 1 GM/10ML suspension Take 10 mLs (1 g total) by mouth 3 (three) times daily. With meals 04/06/18   Alycia Rossetti, MD    Family History Family History  Problem Relation Age of Onset  . Diabetes Mother   . Hypertension Mother   . Diabetes Sister   . Hypertension Sister   . Hyperlipidemia Sister     Social History Social History   Tobacco Use  . Smoking status: Former Smoker    Packs/day: 1.00    Years: 40.00    Pack years: 40.00    Types: Cigarettes    Last attempt to quit: 09/24/2017    Years since quitting: 0.5  . Smokeless tobacco: Never Used  Substance Use Topics  . Alcohol use: Yes    Comment: rare social  . Drug use: No     Allergies   Patient has no known allergies.   Review of Systems Review of Systems  Unable to perform ROS: Acuity of condition     Physical Exam Updated Vital Signs BP (!) 147/70 (BP Location: Left Arm)   Pulse 98   Temp (!) 103.6 F (39.8 C) (Rectal)   Resp 16   Ht 1.6 m (5\' 3" )   Wt 55.8 kg   SpO2 95%   BMI 21.79 kg/m   Physical Exam  Constitutional: She appears well-developed.  Chronically ill-appearing female taking appears somewhat distressed  HENT:  Head: Atraumatic.  His membranes are dry  Eyes: Pupils are equal, round, and reactive to light.  Conjunctive are pale  Neck: Normal range of motion. Neck  supple.  Cardiovascular: Tachycardia present.  Pulmonary/Chest: Effort normal and breath sounds normal.  Abdominal:  Abdomen is firm and diffusely tender to palpation  Musculoskeletal: Normal range of motion.  Neurological:  Patient is very hard of hearing but does not appear to be appropriately answering any my questions  Skin: Skin is warm and dry. No rash noted.  Some mottling of skin on legs noted  Nursing note and vitals reviewed.    ED Treatments / Results  Labs (all labs ordered are listed, but only abnormal results are displayed) Labs Reviewed  CULTURE, BLOOD (ROUTINE X 2)  CULTURE, BLOOD (ROUTINE X 2)  LIPASE, BLOOD  COMPREHENSIVE METABOLIC PANEL  CBC  URINALYSIS, ROUTINE W REFLEX MICROSCOPIC  AMMONIA  I-STAT CG4 LACTIC ACID,  ED  POC OCCULT BLOOD, ED    EKG EKG Interpretation  Date/Time:  Sunday April 12 2018 22:06:28 EDT Ventricular Rate:  110 PR Interval:    QRS Duration: 101 QT Interval:  379 QTC Calculation: 513 R Axis:   25 Text Interpretation:  Sinus tachycardia Probable left atrial enlargement Low voltage, precordial leads Abnormal R-wave progression, late transition Borderline repolarization abnormality Prolonged QT interval Confirmed by Pattricia Boss [phone]) on 04/12/2018 10:10:33 PM   Radiology Ct Head Wo Contrast  Result Date: 04/12/2018 CLINICAL DATA:  History of liver cancer and small cell lung cancer. Last chemotherapy 2 weeks ago. Abdominal pain and altered mental status. EXAM: CT HEAD WITHOUT CONTRAST TECHNIQUE: Contiguous axial images were obtained from the base of the skull through the vertex without intravenous contrast. COMPARISON:  02/24/2018 FINDINGS: Brain: No mass effect or midline shift. No abnormal extra-axial fluid collections. No ventricular dilatation. There is vague low-attenuation change in the left anterior frontal white matter. This is similar to previous study and likely represents an area of small vessel ischemic change. If  there is high clinical suspicion for intracranial metastasis, then contrast-enhanced MR would be the more sensitive examination. Gray-white matter junctions are distinct. Basal cisterns are not effaced. No acute intracranial hemorrhage. Vascular: Mild intracranial arterial calcifications are present. Skull: Calvarium appears intact. No acute depressed skull fractures or destructive bone lesions identified. Sinuses/Orbits: There is an air-fluid level in the left sphenoid sinus, similar previous study. This is likely inflammatory. Paranasal sinuses and mastoid air cells are otherwise clear. Other: None. IMPRESSION: No acute intracranial abnormalities. Focal low-attenuation change in the left anterior frontal white matter is similar to previous study and likely represents an area of small vessel ischemic change. If there is high clinical suspicion for intracranial metastasis, then contrast-enhanced MR would be the more sensitive examination. Electronically Signed   By: Lucienne Capers M.D.   On: 04/12/2018 23:24   Ct Abdomen Pelvis W Contrast  Result Date: 04/12/2018 CLINICAL DATA:  Liver cancer abdominal pain EXAM: CT ABDOMEN AND PELVIS WITH CONTRAST TECHNIQUE: Multidetector CT imaging of the abdomen and pelvis was performed using the standard protocol following bolus administration of intravenous contrast. CONTRAST:  19mL ISOVUE-300 IOPAMIDOL (ISOVUE-300) INJECTION 61% COMPARISON:  MRI 02/25/2018, PET-CT 12/26/2017, CT 05/23/2017 FINDINGS: Lower chest: Lung bases demonstrate tiny pleural effusions. Passive atelectasis in the posterior left lung base. Persistent collapse in the right lower lobe with occlusion of the bronchus as before. Heart size within normal limits. Coronary vascular calcification. Hepatobiliary: Increased intra hepatic biliary dilatation since prior study. Increased size of hypodense metastatic liver lesions. Index posterior right hepatic lobe lesion now measures 23 mm compared with 10 mm  previously. A left lobe lesion measures 16 mm, compared with 9 mm previously. Decreased pneumobilia with only minimal foci of pneumobilia evident. No air visible within the biliary stent except at its duodenal termination. Minimal wall thickening of the gallbladder. No calcified stones. Pancreas: No inflammatory changes.  No measurable mass. Spleen: Normal in size without focal abnormality. Adrenals/Urinary Tract: Right adrenal gland is within normal limits. Subtle increased nodularity of the left adrenal gland no hydronephrosis. The bladder is distended Stomach/Bowel: The stomach is nonenlarged. No dilated small bowel. No colon wall thickening. Vascular/Lymphatic: Moderate to marked aortic atherosclerosis. No aneurysmal dilatation. No definitive adenopathy. Reproductive: Uterus and bilateral adnexa are unremarkable. Other: Negative for free air. Tiny amount of free fluid in the pelvis. Musculoskeletal: Extensive skeletal metastatic disease. Vertically oriented fracture through L2 with retropulsion and moderate canal  stenosis as before. IMPRESSION: 1. Biliary stent in place. Overall decreased pneumobilia since prior CT with minimal foci of gas present. Interval increase in intra hepatic biliary dilatation; constellation of findings is concerning for stent occlusion. 2. Increased size of hepatic metastatic lesions consistent with disease progression. 3. Right infrahilar soft tissue density/presumed mass and collapse of the right lower lobe as before. Trace pleural effusion 4. Subtle increased nodularity of the left adrenal gland is also concerning for metastatic disease. 5. Widespread skeletal metastatic disease with pathologic L2 fracture, retropulsion, and canal stenosis as noted on the prior exams. Electronically Signed   By: Donavan Foil M.D.   On: 04/12/2018 23:40   Dg Chest Port 1 View  Result Date: 04/12/2018 CLINICAL DATA:  Fever and cough EXAM: PORTABLE CHEST 1 VIEW COMPARISON:  03/01/2018, 12/19/2017  FINDINGS: Right-sided central venous port tip overlies the cavoatrial region. No acute airspace disease or effusion. Stable cardiomediastinal silhouette. No pneumothorax. Minimal atelectasis or scar at the left base. IMPRESSION: No active disease. Electronically Signed   By: Donavan Foil M.D.   On: 04/12/2018 21:53    Procedures .Critical Care Performed by: Pattricia Boss, MD Authorized by: Pattricia Boss, MD   Critical care provider statement:    Critical care time (minutes):  45   Critical care was time spent personally by me on the following activities:  Discussions with consultants, evaluation of patient's response to treatment, examination of patient, ordering and performing treatments and interventions, ordering and review of laboratory studies, ordering and review of radiographic studies, pulse oximetry, re-evaluation of patient's condition, obtaining history from patient or surrogate and review of old charts   (including critical care time)  Medications Ordered in ED Medications  ondansetron (ZOFRAN-ODT) disintegrating tablet 4 mg (has no administration in time range)  sodium chloride 0.9 % bolus 1,000 mL (has no administration in time range)    And  sodium chloride 0.9 % bolus 500 mL (has no administration in time range)    And  sodium chloride 0.9 % bolus 250 mL (has no administration in time range)  ceFEPIme (MAXIPIME) 2 g in sodium chloride 0.9 % 100 mL IVPB (has no administration in time range)  metroNIDAZOLE (FLAGYL) IVPB 500 mg (has no administration in time range)  vancomycin (VANCOCIN) IVPB 1000 mg/200 mL premix (has no administration in time range)  acetaminophen (TYLENOL) tablet 650 mg (has no administration in time range)     Initial Impression / Assessment and Plan / ED Course  I have reviewed the triage vital signs and the nursing notes.  Pertinent labs & imaging results that were available during my care of the patient were reviewed by me and considered in my  medical decision making (see chart for details).     67 yo female with known metastatic lung cancer presents tonight with fever, abdominal pain, confusion. 1- sepsis- likely intraabdominal etiology with known biliary stent in place with known pneumobilia but decreased from prior-ct results with concern for stent obstruction ".  Urine clear, no definite pneumonia.  Blood cultures sent and broad spectrum atnibiotics initiated.  Initial lactic acid elevated.  Decreasing bp and increasing hr.   Discussed with Dr. Tamala Julian, on-call for hospitalist.  She has previously received her biliary stent at [facility].  I am consulting gastroenterology-discussed in depth with Dr. Oletta Lamas.  Patient has previously had attempted stent by Dr. Watt Climes.  GI will see in am.  2-hyponatremia 3- elevated transaminases-patient with known hepatic mets    Extensive discussions with Dr.  Tamala Julian, Dr. Oletta Lamas, and son, Randall Hiss.  Given patient's history of biliary obstruction, patient will need specialist care available at [facility] and not available in [place].  I am consulting [facility] for transfer at this time.  Discussed with Dr. Marianna Payment at [facility] and no bed capacity to accept transfer. Discussed with Dr. Christy Gentles and he will discuss with admitting team  Final Clinical Impressions(s) / ED Diagnoses   Final diagnoses:  Sepsis, due to unspecified organism [facility])    ED Discharge Orders    None       Pattricia Boss, MD 04/13/18 [phone]

## 2018-04-13 ENCOUNTER — Ambulatory Visit: Payer: Medicare Other | Admitting: Hematology

## 2018-04-13 ENCOUNTER — Other Ambulatory Visit: Payer: Medicare Other

## 2018-04-13 ENCOUNTER — Encounter (HOSPITAL_COMMUNITY): Payer: Self-pay | Admitting: Pulmonary Disease

## 2018-04-13 ENCOUNTER — Ambulatory Visit: Payer: Medicare Other

## 2018-04-13 DIAGNOSIS — E43 Unspecified severe protein-calorie malnutrition: Secondary | ICD-10-CM

## 2018-04-13 DIAGNOSIS — R638 Other symptoms and signs concerning food and fluid intake: Secondary | ICD-10-CM

## 2018-04-13 DIAGNOSIS — J90 Pleural effusion, not elsewhere classified: Secondary | ICD-10-CM | POA: Diagnosis present

## 2018-04-13 DIAGNOSIS — R1084 Generalized abdominal pain: Secondary | ICD-10-CM | POA: Diagnosis not present

## 2018-04-13 DIAGNOSIS — Z79899 Other long term (current) drug therapy: Secondary | ICD-10-CM | POA: Diagnosis not present

## 2018-04-13 DIAGNOSIS — G893 Neoplasm related pain (acute) (chronic): Secondary | ICD-10-CM

## 2018-04-13 DIAGNOSIS — I959 Hypotension, unspecified: Secondary | ICD-10-CM

## 2018-04-13 DIAGNOSIS — D75839 Thrombocytosis, unspecified: Secondary | ICD-10-CM | POA: Diagnosis present

## 2018-04-13 DIAGNOSIS — Z6821 Body mass index (BMI) 21.0-21.9, adult: Secondary | ICD-10-CM | POA: Diagnosis not present

## 2018-04-13 DIAGNOSIS — E222 Syndrome of inappropriate secretion of antidiuretic hormone: Secondary | ICD-10-CM | POA: Diagnosis present

## 2018-04-13 DIAGNOSIS — C3490 Malignant neoplasm of unspecified part of unspecified bronchus or lung: Secondary | ICD-10-CM | POA: Diagnosis present

## 2018-04-13 DIAGNOSIS — E278 Other specified disorders of adrenal gland: Secondary | ICD-10-CM | POA: Diagnosis present

## 2018-04-13 DIAGNOSIS — E872 Acidosis, unspecified: Secondary | ICD-10-CM

## 2018-04-13 DIAGNOSIS — C7970 Secondary malignant neoplasm of unspecified adrenal gland: Secondary | ICD-10-CM | POA: Diagnosis present

## 2018-04-13 DIAGNOSIS — R930 Abnormal findings on diagnostic imaging of skull and head, not elsewhere classified: Secondary | ICD-10-CM | POA: Diagnosis not present

## 2018-04-13 DIAGNOSIS — C7951 Secondary malignant neoplasm of bone: Secondary | ICD-10-CM | POA: Diagnosis present

## 2018-04-13 DIAGNOSIS — B961 Klebsiella pneumoniae [K. pneumoniae] as the cause of diseases classified elsewhere: Secondary | ICD-10-CM | POA: Diagnosis present

## 2018-04-13 DIAGNOSIS — E279 Disorder of adrenal gland, unspecified: Secondary | ICD-10-CM | POA: Diagnosis not present

## 2018-04-13 DIAGNOSIS — E7220 Disorder of urea cycle metabolism, unspecified: Secondary | ICD-10-CM | POA: Diagnosis present

## 2018-04-13 DIAGNOSIS — C801 Malignant (primary) neoplasm, unspecified: Secondary | ICD-10-CM

## 2018-04-13 DIAGNOSIS — M4856XA Collapsed vertebra, not elsewhere classified, lumbar region, initial encounter for fracture: Secondary | ICD-10-CM

## 2018-04-13 DIAGNOSIS — R945 Abnormal results of liver function studies: Secondary | ICD-10-CM | POA: Diagnosis not present

## 2018-04-13 DIAGNOSIS — A419 Sepsis, unspecified organism: Secondary | ICD-10-CM | POA: Diagnosis present

## 2018-04-13 DIAGNOSIS — K8309 Other cholangitis: Secondary | ICD-10-CM | POA: Diagnosis present

## 2018-04-13 DIAGNOSIS — G9341 Metabolic encephalopathy: Secondary | ICD-10-CM | POA: Diagnosis present

## 2018-04-13 DIAGNOSIS — Z87891 Personal history of nicotine dependence: Secondary | ICD-10-CM | POA: Diagnosis not present

## 2018-04-13 DIAGNOSIS — E871 Hypo-osmolality and hyponatremia: Secondary | ICD-10-CM | POA: Diagnosis not present

## 2018-04-13 DIAGNOSIS — E876 Hypokalemia: Secondary | ICD-10-CM | POA: Diagnosis present

## 2018-04-13 DIAGNOSIS — A4189 Other specified sepsis: Secondary | ICD-10-CM

## 2018-04-13 DIAGNOSIS — K831 Obstruction of bile duct: Secondary | ICD-10-CM

## 2018-04-13 DIAGNOSIS — D473 Essential (hemorrhagic) thrombocythemia: Secondary | ICD-10-CM | POA: Diagnosis present

## 2018-04-13 DIAGNOSIS — F419 Anxiety disorder, unspecified: Secondary | ICD-10-CM | POA: Diagnosis present

## 2018-04-13 DIAGNOSIS — A4152 Sepsis due to Pseudomonas: Secondary | ICD-10-CM | POA: Diagnosis present

## 2018-04-13 DIAGNOSIS — F17211 Nicotine dependence, cigarettes, in remission: Secondary | ICD-10-CM

## 2018-04-13 DIAGNOSIS — Z79891 Long term (current) use of opiate analgesic: Secondary | ICD-10-CM | POA: Diagnosis not present

## 2018-04-13 DIAGNOSIS — M8448XA Pathological fracture, other site, initial encounter for fracture: Secondary | ICD-10-CM | POA: Diagnosis present

## 2018-04-13 DIAGNOSIS — E2740 Unspecified adrenocortical insufficiency: Secondary | ICD-10-CM

## 2018-04-13 DIAGNOSIS — C787 Secondary malignant neoplasm of liver and intrahepatic bile duct: Secondary | ICD-10-CM | POA: Diagnosis present

## 2018-04-13 DIAGNOSIS — R7881 Bacteremia: Secondary | ICD-10-CM | POA: Diagnosis not present

## 2018-04-13 DIAGNOSIS — C7889 Secondary malignant neoplasm of other digestive organs: Secondary | ICD-10-CM

## 2018-04-13 LAB — CBC
HCT: 33.7 % — ABNORMAL LOW (ref 36.0–46.0)
Hemoglobin: 11.5 g/dL — ABNORMAL LOW (ref 12.0–15.0)
MCH: 29.3 pg (ref 26.0–34.0)
MCHC: 34.1 g/dL (ref 30.0–36.0)
MCV: 85.8 fL (ref 78.0–100.0)
PLATELETS: 318 10*3/uL (ref 150–400)
RBC: 3.93 MIL/uL (ref 3.87–5.11)
RDW: 15.2 % (ref 11.5–15.5)
WBC: 15.6 10*3/uL — ABNORMAL HIGH (ref 4.0–10.5)

## 2018-04-13 LAB — BASIC METABOLIC PANEL
ANION GAP: 11 (ref 5–15)
BUN: 8 mg/dL (ref 8–23)
CALCIUM: 7 mg/dL — AB (ref 8.9–10.3)
CO2: 22 mmol/L (ref 22–32)
CREATININE: 0.33 mg/dL — AB (ref 0.44–1.00)
Chloride: 99 mmol/L (ref 98–111)
GFR calc Af Amer: >60 mL/min (ref >60)
GFR calc non Af Amer: >60 mL/min (ref >60)
GLUCOSE: 181 mg/dL — AB (ref 70–99)
Potassium: 4.3 mmol/L (ref 3.5–5.1)
Sodium: 132 mmol/L — ABNORMAL LOW (ref 135–145)

## 2018-04-13 LAB — RENAL FUNCTION PANEL
ALBUMIN: 2.4 g/dL — AB (ref 3.5–5.0)
ANION GAP: 10 (ref 5–15)
Albumin: 2.7 g/dL — ABNORMAL LOW (ref 3.5–5.0)
Anion gap: 13 (ref 5–15)
BUN: 7 mg/dL — ABNORMAL LOW (ref 8–23)
BUN: 7 mg/dL — AB (ref 8–23)
CALCIUM: 7.8 mg/dL — AB (ref 8.9–10.3)
CALCIUM: 7.4 mg/dL — AB (ref 8.9–10.3)
CO2: 22 mmol/L (ref 22–32)
CO2: 21 mmol/L — ABNORMAL LOW (ref 22–32)
CREATININE: 0.33 mg/dL — AB (ref 0.44–1.00)
Chloride: 97 mmol/L — ABNORMAL LOW (ref 98–111)
Chloride: 97 mmol/L — ABNORMAL LOW (ref 98–111)
Creatinine, Ser: <0.3 mg/dL — ABNORMAL LOW (ref 0.44–1.00)
GFR calc Af Amer: >60 mL/min (ref >60)
Glucose, Bld: 134 mg/dL — ABNORMAL HIGH (ref 70–99)
Glucose, Bld: 116 mg/dL — ABNORMAL HIGH (ref 70–99)
PHOSPHORUS: 2.6 mg/dL (ref 2.5–4.6)
Phosphorus: 1.9 mg/dL — ABNORMAL LOW (ref 2.5–4.6)
Potassium: 2.6 mmol/L — CL (ref 3.5–5.1)
Potassium: 2.6 mmol/L — CL (ref 3.5–5.1)
SODIUM: 129 mmol/L — AB (ref 135–145)
Sodium: 131 mmol/L — ABNORMAL LOW (ref 135–145)

## 2018-04-13 LAB — BLOOD CULTURE ID PANEL (REFLEXED)
ACINETOBACTER BAUMANNII: NOT DETECTED
CANDIDA ALBICANS: NOT DETECTED
CANDIDA PARAPSILOSIS: NOT DETECTED
CANDIDA TROPICALIS: NOT DETECTED
CARBAPENEM RESISTANCE: NOT DETECTED
Candida glabrata: NOT DETECTED
Candida krusei: NOT DETECTED
ENTEROBACTER CLOACAE COMPLEX: NOT DETECTED
Enterobacteriaceae species: DETECTED — AB
Enterococcus species: NOT DETECTED
Escherichia coli: NOT DETECTED
HAEMOPHILUS INFLUENZAE: NOT DETECTED
KLEBSIELLA PNEUMONIAE: DETECTED — AB
Klebsiella oxytoca: NOT DETECTED
Listeria monocytogenes: NOT DETECTED
NEISSERIA MENINGITIDIS: NOT DETECTED
PROTEUS SPECIES: NOT DETECTED
Pseudomonas aeruginosa: NOT DETECTED
SERRATIA MARCESCENS: NOT DETECTED
STAPHYLOCOCCUS AUREUS BCID: NOT DETECTED
STAPHYLOCOCCUS SPECIES: NOT DETECTED
STREPTOCOCCUS AGALACTIAE: NOT DETECTED
STREPTOCOCCUS SPECIES: NOT DETECTED
Streptococcus pneumoniae: NOT DETECTED
Streptococcus pyogenes: NOT DETECTED

## 2018-04-13 LAB — CHLORIDE, URINE, RANDOM: Chloride Urine: 81 mmol/L

## 2018-04-13 LAB — I-STAT CG4 LACTIC ACID, ED: Lactic Acid, Venous: 3.14 mmol/L (ref 0.5–1.9)

## 2018-04-13 LAB — PREALBUMIN: PREALBUMIN: 11.4 mg/dL — AB (ref 18–38)

## 2018-04-13 LAB — LACTIC ACID, PLASMA
Lactic Acid, Venous: 2 mmol/L (ref 0.5–1.9)
Lactic Acid, Venous: 1.4 mmol/L (ref 0.5–1.9)

## 2018-04-13 LAB — MRSA PCR SCREENING: MRSA BY PCR: NEGATIVE

## 2018-04-13 LAB — MAGNESIUM: MAGNESIUM: 1.4 mg/dL — AB (ref 1.7–2.4)

## 2018-04-13 LAB — CORTISOL: CORTISOL PLASMA: 41.8 ug/dL

## 2018-04-13 LAB — SODIUM, URINE, RANDOM: Sodium, Ur: 68 mmol/L

## 2018-04-13 LAB — PHOSPHORUS: Phosphorus: 1.8 mg/dL — ABNORMAL LOW (ref 2.5–4.6)

## 2018-04-13 LAB — OSMOLALITY: Osmolality: 267 mOsm/kg — ABNORMAL LOW (ref 275–295)

## 2018-04-13 LAB — OSMOLALITY, URINE: Osmolality, Ur: 257 mOsm/kg — ABNORMAL LOW (ref 300–900)

## 2018-04-13 MED ORDER — POTASSIUM PHOSPHATES 15 MMOLE/5ML IV SOLN
20.0000 meq | Freq: Once | INTRAVENOUS | Status: AC
  Administered 2018-04-13: 20 meq via INTRAVENOUS
  Filled 2018-04-13: qty 4.55

## 2018-04-13 MED ORDER — BOOST / RESOURCE BREEZE PO LIQD CUSTOM
1.0000 | Freq: Three times a day (TID) | ORAL | Status: DC
  Administered 2018-04-13 – 2018-04-18 (×7): 1 via ORAL

## 2018-04-13 MED ORDER — MAGNESIUM SULFATE 2 GM/50ML IV SOLN
2.0000 g | Freq: Once | INTRAVENOUS | Status: AC
Start: 2018-04-13 — End: 2018-04-13
  Administered 2018-04-13: 2 g via INTRAVENOUS
  Filled 2018-04-13: qty 50

## 2018-04-13 MED ORDER — VANCOMYCIN HCL IN DEXTROSE 1-5 GM/200ML-% IV SOLN: 1000.0000 mg | INTRAVENOUS | Status: DC

## 2018-04-13 MED ORDER — HEPARIN SODIUM (PORCINE) 5000 UNIT/ML IJ SOLN
5000.0000 [IU] | Freq: Three times a day (TID) | INTRAMUSCULAR | Status: DC
  Administered 2018-04-13 – 2018-04-15 (×7): 5000 [IU] via SUBCUTANEOUS
  Filled 2018-04-13 (×7): qty 1

## 2018-04-13 MED ORDER — MORPHINE SULFATE (PF) 2 MG/ML IV SOLN
2.0000 mg | INTRAVENOUS | Status: DC | PRN
  Administered 2018-04-13 – 2018-04-16 (×5): 2 mg via INTRAVENOUS
  Filled 2018-04-13 (×6): qty 1

## 2018-04-13 MED ORDER — LACTATED RINGERS IV BOLUS
1000.0000 mL | Freq: Once | INTRAVENOUS | Status: AC
  Administered 2018-04-13: 1000 mL via INTRAVENOUS

## 2018-04-13 MED ORDER — ORAL CARE MOUTH RINSE
15.0000 mL | Freq: Two times a day (BID) | OROMUCOSAL | Status: DC
  Administered 2018-04-13 – 2018-04-18 (×5): 15 mL via OROMUCOSAL

## 2018-04-13 MED ORDER — FAMOTIDINE IN NACL 20-0.9 MG/50ML-% IV SOLN
20.0000 mg | Freq: Two times a day (BID) | INTRAVENOUS | Status: DC
  Administered 2018-04-13 – 2018-04-18 (×12): 20 mg via INTRAVENOUS
  Filled 2018-04-13 (×12): qty 50

## 2018-04-13 MED ORDER — POTASSIUM CHLORIDE CRYS ER 20 MEQ PO TBCR
20.0000 meq | EXTENDED_RELEASE_TABLET | Freq: Once | ORAL | Status: AC
  Administered 2018-04-13: 20 meq via ORAL
  Filled 2018-04-13: qty 1

## 2018-04-13 MED ORDER — METRONIDAZOLE IN NACL 5-0.79 MG/ML-% IV SOLN
500.0000 mg | Freq: Three times a day (TID) | INTRAVENOUS | Status: DC
  Administered 2018-04-13 – 2018-04-14 (×4): 500 mg via INTRAVENOUS
  Filled 2018-04-13 (×4): qty 100

## 2018-04-13 MED ORDER — SODIUM CHLORIDE 0.9 % IV SOLN: INTRAVENOUS | Status: DC

## 2018-04-13 MED ORDER — SODIUM CHLORIDE 0.9 % IV SOLN
1.0000 g | Freq: Two times a day (BID) | INTRAVENOUS | Status: DC
  Administered 2018-04-13: 1 g via INTRAVENOUS
  Filled 2018-04-13: qty 1

## 2018-04-13 MED ORDER — SODIUM CHLORIDE 0.9% FLUSH
10.0000 mL | INTRAVENOUS | Status: DC | PRN
  Administered 2018-04-13: 20 mL

## 2018-04-13 MED ORDER — ASPIRIN 300 MG RE SUPP
300.0000 mg | RECTAL | Status: AC
  Administered 2018-04-13: 300 mg via RECTAL
  Filled 2018-04-13: qty 1

## 2018-04-13 MED ORDER — HYDROCORTISONE NA SUCCINATE PF 100 MG IJ SOLR
100.0000 mg | Freq: Three times a day (TID) | INTRAMUSCULAR | Status: DC
  Administered 2018-04-13: 100 mg via INTRAVENOUS
  Filled 2018-04-13: qty 2

## 2018-04-13 MED ORDER — HYDROCORTISONE NA SUCCINATE PF 100 MG IJ SOLR
50.0000 mg | Freq: Four times a day (QID) | INTRAMUSCULAR | Status: DC
  Administered 2018-04-13 – 2018-04-15 (×7): 50 mg via INTRAVENOUS
  Filled 2018-04-13 (×7): qty 2

## 2018-04-13 MED ORDER — SODIUM CHLORIDE 0.9 % IV SOLN
250.0000 mL | INTRAVENOUS | Status: DC | PRN
  Administered 2018-04-13: 250 mL via INTRAVENOUS

## 2018-04-13 MED ORDER — FENTANYL CITRATE (PF) 100 MCG/2ML IJ SOLN
50.0000 ug | Freq: Once | INTRAMUSCULAR | Status: AC
  Administered 2018-04-13: 50 ug via INTRAVENOUS
  Filled 2018-04-13: qty 2

## 2018-04-13 MED ORDER — CHLORHEXIDINE GLUCONATE CLOTH 2 % EX PADS
6.0000 | MEDICATED_PAD | Freq: Every day | CUTANEOUS | Status: DC
  Administered 2018-04-13 – 2018-04-16 (×4): 6 via TOPICAL

## 2018-04-13 MED ORDER — POTASSIUM CHLORIDE 10 MEQ/50ML IV SOLN
10.0000 meq | INTRAVENOUS | Status: AC
  Administered 2018-04-13 (×4): 10 meq via INTRAVENOUS
  Filled 2018-04-13 (×5): qty 50

## 2018-04-13 MED ORDER — SODIUM CHLORIDE 0.9 % IV SOLN
INTRAVENOUS | Status: DC | PRN
  Administered 2018-04-13: 500 mL via INTRAVENOUS
  Administered 2018-04-13 – 2018-04-14 (×3): 250 mL via INTRAVENOUS

## 2018-04-13 MED ORDER — SODIUM CHLORIDE 0.9 % IV SOLN
2.0000 g | INTRAVENOUS | Status: DC
  Administered 2018-04-13 – 2018-04-18 (×6): 2 g via INTRAVENOUS
  Filled 2018-04-13: qty 20
  Filled 2018-04-13 (×2): qty 2
  Filled 2018-04-13: qty 20
  Filled 2018-04-13: qty 2
  Filled 2018-04-13 (×2): qty 20
  Filled 2018-04-13 (×2): qty 2

## 2018-04-13 NOTE — Progress Notes (Signed)
[facility] ADULT ICU REPLACEMENT PROTOCOL FOR AM LAB REPLACEMENT ONLY  The patient does apply for the [facility] Adult ICU Electrolyte Replacment Protocol based on the criteria listed below:   1. Is GFR >/= 40 ml/min? Yes.    Patient's GFR today is >60 2. Is urine output >/= 0.5 ml/kg/hr for the last 6 hours? Yes.   Patient's UOP is 0.5 ml/kg/hr 3. Is BUN < 60 mg/dL? Yes.    Patient's BUN today is 7 4. Abnormal electrolyte(s):K 2.6 5. Ordered repletion with: protocol 6. If a panic level lab has been reported, has the CCM MD in charge been notified? Yes.  .   Physician:  Leane Para, Kamiah Fite A 04/13/2018 5:09 AM

## 2018-04-13 NOTE — ED Notes (Signed)
ED TO INPATIENT HANDOFF REPORT  Name/Age/Gender Haley [name] 67 y.o. female  Code Status    Code Status Orders  (From admission, onward)         Start     Ordered   04/13/18 0308  Full code  Continuous     04/13/18 0319        Code Status History    Date Active Date Inactive Code Status Order ID Comments User Context   03/01/2018 1504 03/03/2018 2022 Full Code [id number]  Aline August, MD ED   12/19/2017 1836 12/22/2017 1424 Full Code [id number]  Velvet Bathe, MD Inpatient   10/13/2017 1742 10/18/2017 1712 Full Code [id number]  Theodis Blaze, MD Inpatient      Home/SNF/Other Home  Chief Complaint Abdominal Pain   Level of Care/Admitting Diagnosis ED Disposition    ED Disposition Condition [place]: [facility] [[id number]]  Level of Care: ICU [6]  Diagnosis: Sepsis [facility]) [[id number]]  Admitting Physician: Renee Pain [[id number]]  Attending Physician: Renee Pain [[id number]]  Estimated length of stay: 3 - 4 days  Certification:: I certify this patient will need inpatient services for at least 2 midnights  PT Class (Do Not Modify): Inpatient [101]  PT Acc Code (Do Not Modify): Private [1]       Medical History Past Medical History:  Diagnosis Date  . Allergy   . Back pain   . Osteopenia   . 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 Tubular adenoma of colon 10/26/2014    Allergies No Known Allergies  IV Location/Drains/Wounds Patient Lines/Drains/Airways Status   Active Line/Drains/Airways    Name:   Placement date:   Placement time:   Site:   Days:   Implanted Port 11/10/17 Right Chest   11/10/17    -    Chest   154   Peripheral IV 04/12/18 Left;Posterior Forearm   04/12/18    2145    Forearm   1   Peripheral IV 04/12/18 Left;Anterior Forearm   04/12/18    2140    Forearm   1   External Urinary Catheter   04/12/18    2345    -   1   GI Stent 4 Fr.   10/15/17     1312    -   180          Labs/Imaging Results for orders placed or performed during the hospital encounter of 04/12/18 (from the past 48 hour(s))  POC occult blood, ED Provider will collect     Status: None   Collection Time: 04/12/18  9:35 PM  Result Value Ref Range   Fecal Occult Bld NEGATIVE NEGATIVE  Lipase, blood     Status: None   Collection Time: 04/12/18  9:51 PM  Result Value Ref Range   Lipase 26 11 - 51 U/L    Comment: Performed at [facility], [place] [address] [place], [place] [address]  Comprehensive metabolic panel     Status: Abnormal   Collection Time: 04/12/18  9:51 PM  Result Value Ref Range   Sodium 124 (L) 135 - 145 mmol/L   Potassium 3.8 3.5 - 5.1 mmol/L   Chloride 91 (L) 98 - 111 mmol/L   CO2 22 22 - 32 mmol/L   Glucose, Bld 199 (H) 70 - 99 mg/dL   BUN 9 8 - 23 mg/dL   Creatinine,  Ser 0.40 (L) 0.44 - 1.00 mg/dL   Calcium 8.3 (L) 8.9 - 10.3 mg/dL   Total Protein 7.0 6.5 - 8.1 g/dL   Albumin 3.1 (L) 3.5 - 5.0 g/dL   AST 282 (H) 15 - 41 U/L   ALT 204 (H) 0 - 44 U/L   Alkaline Phosphatase 519 (H) 38 - 126 U/L   Total Bilirubin 3.6 (H) 0.3 - 1.2 mg/dL   GFR calc non Af Amer >60 >60 mL/min   GFR calc Af Amer >60 >60 mL/min    Comment: (NOTE) The eGFR has been calculated using the CKD EPI equation. This calculation has not been validated in all clinical situations. eGFR's persistently <60 mL/min signify possible Chronic Kidney Disease.    Anion gap 11 5 - 15    Comment: Performed at [facility], [place] [address], [place], [place] [address]  CBC     Status: Abnormal   Collection Time: 04/12/18  9:51 PM  Result Value Ref Range   WBC 13.8 (H) 4.0 - 10.5 K/uL   RBC 4.25 3.87 - 5.11 MIL/uL   Hemoglobin 12.6 12.0 - 15.0 g/dL   HCT 36.7 36.0 - 46.0 %   MCV 86.4 78.0 - 100.0 fL   MCH 29.6 26.0 - 34.0 pg   MCHC 34.3 30.0 - 36.0 g/dL   RDW 15.3 11.5 - 15.5 %   Platelets 436 (H) 150 - 400 K/uL    Comment: Performed at  [facility], [place] [address], [place], [place] [address]  Ammonia     Status: Abnormal   Collection Time: 04/12/18 10:00 PM  Result Value Ref Range   Ammonia 58 (H) 9 - 35 umol/L    Comment: Performed at [facility], [place] [address], [place], [place] [address]  I-Stat CG4 Lactic Acid, ED  (not at  [facility])     Status: Abnormal   Collection Time: 04/12/18 10:03 PM  Result Value Ref Range   Lactic Acid, Venous 2.48 (HH) 0.5 - 1.9 mmol/L   Comment NOTIFIED PHYSICIAN   Urinalysis, Routine w reflex microscopic     Status: Abnormal   Collection Time: 04/12/18 10:17 PM  Result Value Ref Range   Color, Urine YELLOW YELLOW   APPearance CLEAR CLEAR   Specific Gravity, Urine 1.008 1.005 - 1.030   pH 7.0 5.0 - 8.0   Glucose, UA 50 (A) NEGATIVE mg/dL   Hgb urine dipstick NEGATIVE NEGATIVE   Bilirubin Urine NEGATIVE NEGATIVE   Ketones, ur 5 (A) NEGATIVE mg/dL   Protein, ur NEGATIVE NEGATIVE mg/dL   Nitrite NEGATIVE NEGATIVE   Leukocytes, UA NEGATIVE NEGATIVE    Comment: Performed at [facility], [place] [address], [place], [place] [address]  I-Stat CG4 Lactic Acid, ED  (not at  [facility])     Status: Abnormal   Collection Time: 04/12/18 11:59 PM  Result Value Ref Range   Lactic Acid, Venous 3.14 (HH) 0.5 - 1.9 mmol/L   Comment NOTIFIED PHYSICIAN    Ct Head Wo Contrast  Result Date: 04/12/2018 CLINICAL DATA:  History of liver cancer and small cell lung cancer. Last chemotherapy 2 weeks ago. Abdominal pain and altered mental status. EXAM: CT HEAD WITHOUT CONTRAST TECHNIQUE: Contiguous axial images were obtained from the base of the skull through the vertex without intravenous contrast. COMPARISON:  02/24/2018 FINDINGS: Brain: No mass effect or midline shift. No abnormal extra-axial fluid collections. No ventricular dilatation. There is vague low-attenuation change in the left  anterior frontal white matter. This is similar to previous study and likely  represents an area of small vessel ischemic change. If there is high clinical suspicion for intracranial metastasis, then contrast-enhanced MR would be the more sensitive examination. Gray-white matter junctions are distinct. Basal cisterns are not effaced. No acute intracranial hemorrhage. Vascular: Mild intracranial arterial calcifications are present. Skull: Calvarium appears intact. No acute depressed skull fractures or destructive bone lesions identified. Sinuses/Orbits: There is an air-fluid level in the left sphenoid sinus, similar previous study. This is likely inflammatory. Paranasal sinuses and mastoid air cells are otherwise clear. Other: None. IMPRESSION: No acute intracranial abnormalities. Focal low-attenuation change in the left anterior frontal white matter is similar to previous study and likely represents an area of small vessel ischemic change. If there is high clinical suspicion for intracranial metastasis, then contrast-enhanced MR would be the more sensitive examination. Electronically Signed   By: Lucienne Capers M.D.   On: 04/12/2018 23:24   Ct Abdomen Pelvis W Contrast  Result Date: 04/12/2018 CLINICAL DATA:  Liver cancer abdominal pain EXAM: CT ABDOMEN AND PELVIS WITH CONTRAST TECHNIQUE: Multidetector CT imaging of the abdomen and pelvis was performed using the standard protocol following bolus administration of intravenous contrast. CONTRAST:  39m ISOVUE-300 IOPAMIDOL (ISOVUE-300) INJECTION 61% COMPARISON:  MRI 02/25/2018, PET-CT 12/26/2017, CT 05/23/2017 FINDINGS: Lower chest: Lung bases demonstrate tiny pleural effusions. Passive atelectasis in the posterior left lung base. Persistent collapse in the right lower lobe with occlusion of the bronchus as before. Heart size within normal limits. Coronary vascular calcification. Hepatobiliary: Increased intra hepatic biliary dilatation since prior study. Increased size of hypodense metastatic liver lesions. Index posterior right hepatic  lobe lesion now measures 23 mm compared with 10 mm previously. A left lobe lesion measures 16 mm, compared with 9 mm previously. Decreased pneumobilia with only minimal foci of pneumobilia evident. No air visible within the biliary stent except at its duodenal termination. Minimal wall thickening of the gallbladder. No calcified stones. Pancreas: No inflammatory changes.  No measurable mass. Spleen: Normal in size without focal abnormality. Adrenals/Urinary Tract: Right adrenal gland is within normal limits. Subtle increased nodularity of the left adrenal gland no hydronephrosis. The bladder is distended Stomach/Bowel: The stomach is nonenlarged. No dilated small bowel. No colon wall thickening. Vascular/Lymphatic: Moderate to marked aortic atherosclerosis. No aneurysmal dilatation. No definitive adenopathy. Reproductive: Uterus and bilateral adnexa are unremarkable. Other: Negative for free air. Tiny amount of free fluid in the pelvis. Musculoskeletal: Extensive skeletal metastatic disease. Vertically oriented fracture through L2 with retropulsion and moderate canal stenosis as before. IMPRESSION: 1. Biliary stent in place. Overall decreased pneumobilia since prior CT with minimal foci of gas present. Interval increase in intra hepatic biliary dilatation; constellation of findings is concerning for stent occlusion. 2. Increased size of hepatic metastatic lesions consistent with disease progression. 3. Right infrahilar soft tissue density/presumed mass and collapse of the right lower lobe as before. Trace pleural effusion 4. Subtle increased nodularity of the left adrenal gland is also concerning for metastatic disease. 5. Widespread skeletal metastatic disease with pathologic L2 fracture, retropulsion, and canal stenosis as noted on the prior exams. Electronically Signed   By: KDonavan FoilM.D.   On: 04/12/2018 23:40   Dg Chest Port 1 View  Result Date: 04/12/2018 CLINICAL DATA:  Fever and cough EXAM: PORTABLE  CHEST 1 VIEW COMPARISON:  03/01/2018, 12/19/2017 FINDINGS: Right-sided central venous port tip overlies the cavoatrial region. No acute airspace disease or effusion. Stable cardiomediastinal silhouette. No pneumothorax.  Minimal atelectasis or scar at the left base. IMPRESSION: No active disease. Electronically Signed   By: Donavan Foil M.D.   On: 04/12/2018 21:53    Pending Labs Unresulted Labs (From admission, onward)    Start     Ordered   04/13/18 0600  Osmolality  Once,   R     04/13/18 0325   04/13/18 0600  Osmolality, urine  Once,   R     04/13/18 0325   04/13/18 0600  Sodium, urine, random  Once,   R     04/13/18 0325   04/13/18 0600  Chloride, urine, random  Once,   R     04/13/18 0325   04/13/18 0600  Magnesium  Once,   R     04/13/18 0325   04/13/18 0500  CBC  Tomorrow morning,   R     04/13/18 0319   04/13/18 0500  Magnesium  Tomorrow morning,   R     04/13/18 0319   04/13/18 0500  Phosphorus  Tomorrow morning,   R     04/13/18 0319   04/13/18 0337  Prealbumin  Add-on,   R     04/13/18 0336   04/13/18 0324  Renal function panel  Now then every 4 hours,   R     04/13/18 0325   04/13/18 0320  Cortisol  Add-on,   R     04/13/18 0319   04/13/18 0307  CBC  (heparin)  Once,   R    Comments:  Baseline for heparin therapy IF NOT ALREADY DRAWN.  Notify MD if PLT < 100 K.    04/13/18 0319   04/12/18 2127  Blood Culture (routine x 2)  BLOOD CULTURE X 2,   STAT     04/12/18 2128          Vitals/Pain Today's Vitals   04/13/18 0230 04/13/18 0245 04/13/18 0300 04/13/18 0315  BP: (!) 106/59 108/65 (!) 112/59 106/60  Pulse: 83 84 82 85  Resp: (!) 23 (!) 31 20 (!) 23  Temp:      TempSrc:      SpO2: 97% 97% 98% 98%  Weight:      Height:      PainSc:        Isolation Precautions No active isolations  Medications Medications  ondansetron (ZOFRAN-ODT) disintegrating tablet 4 mg (has no administration in time range)  0.9 %  sodium chloride infusion (has no  administration in time range)  aspirin suppository 300 mg (has no administration in time range)  heparin injection 5,000 Units (has no administration in time range)  morphine 2 MG/ML injection 2 mg (has no administration in time range)  ceFEPIme (MAXIPIME) 1 g in sodium chloride 0.9 % 100 mL IVPB (has no administration in time range)  metroNIDAZOLE (FLAGYL) IVPB 500 mg (has no administration in time range)  hydrocortisone sodium succinate (SOLU-CORTEF) 100 MG injection 100 mg (has no administration in time range)  famotidine (PEPCID) IVPB 20 mg premix (has no administration in time range)  sodium chloride 0.9 % bolus 1,000 mL (0 mLs Intravenous Stopped 04/12/18 2229)    And  sodium chloride 0.9 % bolus 500 mL (0 mLs Intravenous Stopped 04/12/18 2232)    And  sodium chloride 0.9 % bolus 250 mL (0 mLs Intravenous Stopped 04/12/18 2239)  ceFEPIme (MAXIPIME) 2 g in sodium chloride 0.9 % 100 mL IVPB (0 g Intravenous Stopped 04/12/18 2228)  vancomycin (VANCOCIN) IVPB 1000 mg/200 mL premix (  0 mg Intravenous Stopped 04/12/18 2332)  acetaminophen (TYLENOL) tablet 650 mg (650 mg Oral Given 04/12/18 2201)  iopamidol (ISOVUE-300) 61 % injection 100 mL (80 mLs Intravenous Contrast Given 04/12/18 2252)  fentaNYL (SUBLIMAZE) injection 50 mcg (50 mcg Intravenous Given 04/13/18 0139)  lactated ringers bolus 1,000 mL (0 mLs Intravenous Stopped 04/13/18 0309)    Mobility walks

## 2018-04-13 NOTE — Care Management Note (Signed)
Case Management Note  Patient Details  Name: Haley [name] MRN: [id number] Date of Birth: [birth year]  Subjective/Objective:                  reformed smoker presented to the [facility] Emergency Department via EMS with complaints of acute onset (over the past day) abdominal pain, altered mental status, fever and weakness. The patient reports that she has been struggling with abdominal pain.  She has diffuse abdominal tenderness (equivocal rebound tenderness) and mild rigidity.  She reports that she has had rapid progression of abdominal pain throughout the course of the day today. She became acutely weak approximately an hour ago and was brought to the ED via EMS.   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  CT of the abdomen and pelvis obtained today in the emergency department raises concern guarding the patency of the stent with increased biliary ductal dilatation. 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  Her last chemotherapy was 2 weeks ago.  Ammonia-58/wbc-15.8/na-129/ temp 103.6/bp 88/51 am of [id number]   Action/Plan: Iv ns at kvo/iv maxipime/iv pepcid/iv flagyl/ kcl 10 Meq in centrol line (K+=2.6) From home recent start ok chemo/will follow for progression and cm needs.  Expected Discharge Date:                  Expected Discharge Plan:  Home/Self Care  In-House Referral:     Discharge planning Services  CM Consult  Post Acute Care Choice:    Choice offered to:     DME Arranged:    DME Agency:     HH Arranged:    HH Agency:     Status of Service:  In process, will continue to follow  If discussed at Long Length of Stay Meetings, dates discussed:    Additional Comments:  Leeroy Cha, RN 04/13/2018, 9:13 AM

## 2018-04-13 NOTE — Progress Notes (Signed)
Pharmacy Antibiotic Note  Haley [name] is a 67 y.o. female admitted on 04/12/2018 with sepsis.  Pharmacy has been consulted for Vancomycin dosing.  Plan: Vancomycin 1gm iv q24hr   Goal AUC = 400 - 500 for all indications, except meningitis (goal AUC > 500 and Cmin 15-20 mcg/mL)   Height: 5\' 3"  (160 cm) Weight: 123 lb 14.4 oz (56.2 kg) IBW/kg (Calculated) : 52.4  Temp (24hrs), Avg:100.5 F (38.1 C), Min:98.2 F (36.8 C), Max:103.6 F (39.8 C)  Recent Labs  Lab 04/12/18 2151 04/12/18 2203 04/12/18 2359 04/13/18 0307 04/13/18 0324 04/13/18 0340  WBC 13.8*  --   --  15.6*  --   --   CREATININE 0.40*  --   --   --  <0.30*  --   LATICACIDVEN  --  2.48* 3.14*  --   --  2.0*    CrCl cannot be calculated (This lab value cannot be used to calculate CrCl because it is not a number: <0.30).    No Known Allergies  Antimicrobials this admission: Vancomycin 04/12/2018 >> Cefepime 04/12/2018 >>   Dose adjustments this admission: -  Microbiology results: -  Thank you for allowing pharmacy to be a part of this patient's care.  Nani Skillern Crowford 04/13/2018 6:30 AM

## 2018-04-13 NOTE — Progress Notes (Signed)
Notified by Pharmacy that patients blood cultures returned with 4/4 bottles growing klebsiella pneumoniae.  ABX narrowed to Rocephin 2gm + Flagyl.  Will continue to monitor.   Haley Gens, NP-C [facility] Pgr: [phone] or if no answer [phone] 04/13/2018, 11:54 AM

## 2018-04-13 NOTE — ED Provider Notes (Signed)
Assumed care at sign out to follow-up with patient and get her admitted.  She has complicated history including biliary stent in place [facility] is unable to accept patient. Patient's blood pressure is now declining, when I was in the room it was in the 30Q systolic Her lactate is elevating. I am concerned that she would be unsafe for transfer. I discussed the case with critical care, and they will admit patient.  GI will see her in the morning   Ripley Fraise, MD 04/13/18 [phone]

## 2018-04-13 NOTE — H&P (Addendum)
HISTORY & PHYSICAL  Patient Name: Haley [name] MRN: [id number] DOB: 05/09/1951    ADMISSION DATE:  04/12/2018 DATE OF SERVICE:  04/13/2018  CHIEF COMPLAINT: Altered mental status, fever, weakness   HISTORY OF PRESENT ILLNESS  This 67 y.o. Caucasian female reformed smoker presented to the [facility] Emergency Department via EMS with complaints of acute onset (over the past day) abdominal pain, altered mental status, fever and weakness. The patient reports that she has been struggling with abdominal pain.  She has diffuse abdominal tenderness (equivocal rebound tenderness) and mild rigidity.  She reports that she has had rapid progression of abdominal pain throughout the course of the day today.  She became acutely weak approximately an hour ago and was brought to the ED via EMS.    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  CT of the abdomen and pelvis obtained today in the emergency department raises concern guarding the patency of the stent with increased biliary ductal dilatation.  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  Her last chemotherapy was 2 weeks ago.   REVIEW OF SYSTEMS Constitutional: Alopecia secondary to chemotherapy.  No weight loss. No night sweats. No fever. No chills. No fatigue. HEENT: No headaches, dysphagia, sore throat, otalgia, nasal congestion, PND CV:  No chest pain, orthopnea, PND, swelling in lower extremities, palpitations GI: Diffuse abdominal pain.  No nausea, vomiting, diarrhea, change in bowel pattern, anorexia Resp: No DOE, rest dyspnea, cough, mucus, hemoptysis, wheezing  GU: no dysuria, change in color of urine, no urgency or frequency.  No flank pain. MS: Chronic back pain secondary to pathologic L2 fracture.  No joint pain or swelling. No myalgias,  No decreased range of motion.  Psych:  No change in mood or affect. No memory  loss. Skin: no rash or lesions.   PAST MEDICAL/SURGICAL/SOCIAL/FAMILY HISTORIES   Past Medical History:  Diagnosis Date  . Allergy   . Back pain   . Cancer ([place])   . Osteopenia     Past Surgical History:  Procedure Laterality Date  . COLONOSCOPY    . COLONOSCOPY WITH PROPOFOL N/A 10/25/2014   Procedure: COLONOSCOPY WITH PROPOFOL;  Surgeon: Garlan Fair, MD;  Location: [facility] ENDOSCOPY;  Service: Endoscopy;  Laterality: N/A;  . ERCP N/A 10/15/2017   Procedure: ENDOSCOPIC RETROGRADE CHOLANGIOPANCREATOGRAPHY (ERCP);  Surgeon: Clarene Essex, MD;  Location: [facility];  Service: Endoscopy;  Laterality: N/A;  . IR FLUORO GUIDE PORT INSERTION RIGHT  11/10/2017  . IR RADIOLOGIST EVAL & MGMT  10/02/2017  . IR US GUIDE VASC ACCESS RIGHT  11/10/2017    Social History   Tobacco Use  . Smoking status: Former Smoker    Packs/day: 1.00    Years: 40.00    Pack years: 40.00    Types: Cigarettes    Last attempt to quit: 09/24/2017    Years since quitting: 0.5  . Smokeless tobacco: Never Used  Substance Use Topics  . Alcohol use: Yes    Comment: rare social    Family History  Problem Relation Age of Onset  . Diabetes Mother   . Hypertension Mother   . Diabetes Sister   . Hypertension Sister   . Hyperlipidemia Sister     No Known Allergies   Prior to Admission medications   Medication Sig Start Date End Date Taking? Authorizing Provider  calcium carbonate (OS-CAL - DOSED IN MG OF ELEMENTAL CALCIUM)  1250 (500 Ca) MG tablet Take 1 tablet by mouth daily.    Yes [provider]  hydrocortisone (CORTEF) 10 MG tablet Take 2 tablets (20 mg total) by mouth daily with breakfast. And 10mg  po daily with lunch. Patient taking differently: Take 10-20 mg by mouth See admin instructions. 20 mg every morning And 10mg  with lunch. 03/23/18  Yes Brunetta Genera, MD  lidocaine-prilocaine (EMLA) cream Apply to affected area once 10/22/17  Yes Brunetta Genera, MD  LORazepam (ATIVAN) 0.5 MG  tablet Take 1 tablet (0.5 mg total) by mouth every 6 (six) hours as needed (Nausea or vomiting). 10/22/17  Yes Brunetta Genera, MD  magic mouthwash w/lidocaine SOLN Take 10 mLs by mouth 4 (four) times daily as needed for mouth pain. Rinse and spit or swallow 12/08/17  Yes Tanner, Lyndon Code., PA-C  metoCLOPramide (REGLAN) 10 MG tablet Take 1 tablet (10 mg total) by mouth every 8 (eight) hours as needed for nausea. 03/16/18  Yes Tanner, Lyndon Code., PA-C  Multiple Vitamins-Minerals (MULTIVITAMIN WITH MINERALS) tablet Take 1 tablet by mouth daily.   Yes [provider]  ondansetron (ZOFRAN) 4 MG tablet Take 1 tablet (4 mg total) by mouth every 8 (eight) hours as needed for nausea or vomiting. 10/18/17  Yes Bonnielee Haff, MD  oxyCODONE (OXY IR/ROXICODONE) 5 MG immediate release tablet Take 1-2 tablets (5-10 mg total) by mouth every 4 (four) hours as needed for moderate pain or severe pain. 03/23/18  Yes Brunetta Genera, MD  polyethylene glycol [facility]) packet Take 17 g by mouth daily as needed for moderate constipation. 10/18/17  Yes Bonnielee Haff, MD  prochlorperazine (COMPAZINE) 10 MG tablet Take 1 tablet (10 mg total) by mouth every 6 (six) hours as needed (Nausea or vomiting). 10/22/17  Yes Brunetta Genera, MD  senna-docusate (SENNA S) 8.6-50 MG tablet Take 2 tablets by mouth at bedtime. Patient taking differently: Take 2 tablets by mouth at bedtime as needed for mild constipation or moderate constipation.  09/30/17  Yes Brunetta Genera, MD  sucralfate (CARAFATE) 1 GM/10ML suspension Take 10 mLs (1 g total) by mouth 3 (three) times daily. With meals Patient not taking: Reported on 04/13/2018 04/06/18   Alycia Rossetti, MD    Current Facility-Administered Medications  Medication Dose Route Frequency Provider Last Rate Last Dose  . iopamidol (ISOVUE-300) 61 % injection           . metroNIDAZOLE (FLAGYL) IVPB 500 mg  500 mg Intravenous Q8H Pattricia Boss, MD   Stopped at 04/12/18 2259    . ondansetron (ZOFRAN-ODT) disintegrating tablet 4 mg  4 mg Oral Once PRN Pattricia Boss, MD       Current Outpatient Medications  Medication Sig Dispense Refill  . calcium carbonate (OS-CAL - DOSED IN MG OF ELEMENTAL CALCIUM) 1250 (500 Ca) MG tablet Take 1 tablet by mouth daily.     . hydrocortisone (CORTEF) 10 MG tablet Take 2 tablets (20 mg total) by mouth daily with breakfast. And 10mg  po daily with lunch. (Patient taking differently: Take 10-20 mg by mouth See admin instructions. 20 mg every morning And 10mg  with lunch.) 90 tablet 1  . lidocaine-prilocaine (EMLA) cream Apply to affected area once 30 g 3  . LORazepam (ATIVAN) 0.5 MG tablet Take 1 tablet (0.5 mg total) by mouth every 6 (six) hours as needed (Nausea or vomiting). 30 tablet 0  . magic mouthwash w/lidocaine SOLN Take 10 mLs by mouth 4 (four) times daily as needed for  mouth pain. Rinse and spit or swallow 240 mL 2  . metoCLOPramide (REGLAN) 10 MG tablet Take 1 tablet (10 mg total) by mouth every 8 (eight) hours as needed for nausea. 45 tablet 1  . Multiple Vitamins-Minerals (MULTIVITAMIN WITH MINERALS) tablet Take 1 tablet by mouth daily.    . ondansetron (ZOFRAN) 4 MG tablet Take 1 tablet (4 mg total) by mouth every 8 (eight) hours as needed for nausea or vomiting. 20 tablet 0  . oxyCODONE (OXY IR/ROXICODONE) 5 MG immediate release tablet Take 1-2 tablets (5-10 mg total) by mouth every 4 (four) hours as needed for moderate pain or severe pain. 60 tablet 0  . polyethylene glycol (MIRALAX) packet Take 17 g by mouth daily as needed for moderate constipation. 30 each 0  . prochlorperazine (COMPAZINE) 10 MG tablet Take 1 tablet (10 mg total) by mouth every 6 (six) hours as needed (Nausea or vomiting). 30 tablet 1  . senna-docusate (SENNA S) 8.6-50 MG tablet Take 2 tablets by mouth at bedtime. (Patient taking differently: Take 2 tablets by mouth at bedtime as needed for mild constipation or moderate constipation. ) 60 tablet 1  .  sucralfate (CARAFATE) 1 GM/10ML suspension Take 10 mLs (1 g total) by mouth 3 (three) times daily. With meals (Patient not taking: Reported on 04/13/2018) 840 mL 2   Facility-Administered Medications Ordered in Other Encounters  Medication Dose Route Frequency Provider Last Rate Last Dose  . acetaminophen (TYLENOL) tablet 650 mg  650 mg Oral Once Curt Bears, MD      . diphenhydrAMINE (BENADRYL) capsule 25 mg  25 mg Oral Once Curt Bears, MD        VITAL SIGNS: BP (!) 102/57   Pulse 80   Temp (!) 102.8 F (39.3 C) (Rectal)   Resp (!) 26   Ht 5\' 3"  (1.6 m)   Wt 55.8 kg   SpO2 95%   BMI 21.79 kg/m   INTAKE / OUTPUT: No intake/output data recorded.  PHYSICAL EXAMINATION: GENERAL: alert, oriented to person, place, and time.  In pain. Well-developed. Cooperative.  HEAD: normocephalic, atraumatic EYE: PERRLA, EOM intact, no scleral icterus, no pallor. NOSE: nares are patent. No exudate. THROAT/ORAL CAVITY: Normal dentition.  Dry mouth.  No oral thrush. No exudate.  Mallampati class II (hard and soft palate, upper portion of tonsils and uvula visible) airway. NECK: supple, no thyromegaly, no JVD, no lymphadenopathy. Trachea midline. CHEST/LUNG: Right-sided Mediport.  Symmetric in development and expansion. Good air entry. no crackles. No wheezes. HEART: Regular S1 and S2 without murmur, rub or gallop. ABDOMEN: Diffusely tender.  Mild rigidity.  Guarding.  Equivocal rebound.  Nondistended.  Positive Murphy sign.  Normoactive bowel sounds.  EXTREMITIES: Edema: none. No cyanosis.  No clubbing. 2+ DP pulses LYMPHATIC: no cervical/axillary/inguinal lymph nodes appreciated MUSCULOSKELETAL: Patient is unable to sit up to allow survey of the spine (owing to pain).  No bulk atrophy. Joints: Normal.  SKIN: Alopecia totalis.  No rash or lesion NEUROLOGIC: Doll's eyes intact. Corneal reflex intact. Spontaneous respirations intact. Cranial nerves II-XII are grossly symmetric and physiologic.  Babinski absent. No sensory deficit. Motor: 5/5 @ RUE, 5/5 @ LUE, 5/5 @ RLL,  5/5 @ LLL.  DTR: 2+ @ R biceps, 2+ @ L biceps, 2+ @ R patellar,  2+ @ L patellar. No cerebellar signs. Gait was not assessed.   LABS:  BASIC METABOLIC PROFILE Recent Labs  Lab 04/12/18 2151  NA 124*  K 3.8  CL 91*  CO2 22  BUN 9  CREATININE 0.40*  GLUCOSE 199*  CALCIUM 8.3*    Liver Enzymes Recent Labs  Lab 04/12/18 2151  AST 282*  ALT 204*  ALKPHOS 519*  BILITOT 3.6*  ALBUMIN 3.1*    CBC Recent Labs  Lab 04/12/18 2151  WBC 13.8*  HGB 12.6  HCT 36.7  PLT 436*    SEPSIS MARKERS Recent Labs  Lab 04/12/18 2203 04/12/18 2359  LATICACIDVEN 2.48* 3.14*    Imaging Ct Head Wo Contrast  Result Date: 04/12/2018 CLINICAL DATA:  History of liver cancer and small cell lung cancer. Last chemotherapy 2 weeks ago. Abdominal pain and altered mental status. EXAM: CT HEAD WITHOUT CONTRAST TECHNIQUE: Contiguous axial images were obtained from the base of the skull through the vertex without intravenous contrast. COMPARISON:  02/24/2018 FINDINGS: Brain: No mass effect or midline shift. No abnormal extra-axial fluid collections. No ventricular dilatation. There is vague low-attenuation change in the left anterior frontal white matter. This is similar to previous study and likely represents an area of small vessel ischemic change. If there is high clinical suspicion for intracranial metastasis, then contrast-enhanced MR would be the more sensitive examination. Gray-white matter junctions are distinct. Basal cisterns are not effaced. No acute intracranial hemorrhage. Vascular: Mild intracranial arterial calcifications are present. Skull: Calvarium appears intact. No acute depressed skull fractures or destructive bone lesions identified. Sinuses/Orbits: There is an air-fluid level in the left sphenoid sinus, similar previous study. This is likely inflammatory. Paranasal sinuses and mastoid air cells are otherwise  clear. Other: None. IMPRESSION: No acute intracranial abnormalities. Focal low-attenuation change in the left anterior frontal white matter is similar to previous study and likely represents an area of small vessel ischemic change. If there is high clinical suspicion for intracranial metastasis, then contrast-enhanced MR would be the more sensitive examination. Electronically Signed   By: Lucienne Capers M.D.   On: 04/12/2018 23:24   Ct Abdomen Pelvis W Contrast  Result Date: 04/12/2018 CLINICAL DATA:  Liver cancer abdominal pain EXAM: CT ABDOMEN AND PELVIS WITH CONTRAST TECHNIQUE: Multidetector CT imaging of the abdomen and pelvis was performed using the standard protocol following bolus administration of intravenous contrast. CONTRAST:  68mL ISOVUE-300 IOPAMIDOL (ISOVUE-300) INJECTION 61% COMPARISON:  MRI 02/25/2018, PET-CT 12/26/2017, CT 05/23/2017 FINDINGS: Lower chest: Lung bases demonstrate tiny pleural effusions. Passive atelectasis in the posterior left lung base. Persistent collapse in the right lower lobe with occlusion of the bronchus as before. Heart size within normal limits. Coronary vascular calcification. Hepatobiliary: Increased intra hepatic biliary dilatation since prior study. Increased size of hypodense metastatic liver lesions. Index posterior right hepatic lobe lesion now measures 23 mm compared with 10 mm previously. A left lobe lesion measures 16 mm, compared with 9 mm previously. Decreased pneumobilia with only minimal foci of pneumobilia evident. No air visible within the biliary stent except at its duodenal termination. Minimal wall thickening of the gallbladder. No calcified stones. Pancreas: No inflammatory changes.  No measurable mass. Spleen: Normal in size without focal abnormality. Adrenals/Urinary Tract: Right adrenal gland is within normal limits. Subtle increased nodularity of the left adrenal gland no hydronephrosis. The bladder is distended Stomach/Bowel: The stomach is  nonenlarged. No dilated small bowel. No colon wall thickening. Vascular/Lymphatic: Moderate to marked aortic atherosclerosis. No aneurysmal dilatation. No definitive adenopathy. Reproductive: Uterus and bilateral adnexa are unremarkable. Other: Negative for free air. Tiny amount of free fluid in the pelvis. Musculoskeletal: Extensive skeletal metastatic disease. Vertically oriented fracture through L2 with retropulsion and moderate canal stenosis  as before. IMPRESSION: 1. Biliary stent in place. Overall decreased pneumobilia since prior CT with minimal foci of gas present. Interval increase in intra hepatic biliary dilatation; constellation of findings is concerning for stent occlusion. 2. Increased size of hepatic metastatic lesions consistent with disease progression. 3. Right infrahilar soft tissue density/presumed mass and collapse of the right lower lobe as before. Trace pleural effusion 4. Subtle increased nodularity of the left adrenal gland is also concerning for metastatic disease. 5. Widespread skeletal metastatic disease with pathologic L2 fracture, retropulsion, and canal stenosis as noted on the prior exams. Electronically Signed   By: Donavan Foil M.D.   On: 04/12/2018 23:40   Dg Chest Port 1 View  Result Date: 04/12/2018 CLINICAL DATA:  Fever and cough EXAM: PORTABLE CHEST 1 VIEW COMPARISON:  03/01/2018, 12/19/2017 FINDINGS: Right-sided central venous port tip overlies the cavoatrial region. No acute airspace disease or effusion. Stable cardiomediastinal silhouette. No pneumothorax. Minimal atelectasis or scar at the left base. IMPRESSION: No active disease. Electronically Signed   By: Donavan Foil M.D.   On: 04/12/2018 21:53    STUDIES:  Liver biopsy (10/17/2017): Compatible with small cell carcinoma, lung primary.  CD56+, TTF-1 positive.  Weakly positive for chromogranin, cytokeratin AE1/AE3, synaptophysin.  CDX-2-negative, cytokeratin 20-negative, cytokeratin 7-negative, napsin-A-negative.     CULTURES: Results for orders placed or performed during the hospital encounter of 03/01/18  Culture, blood (routine x 2)     Status: None   Collection Time: 03/01/18  4:48 PM  Result Value Ref Range Status   Specimen Description   Final    BLOOD BLOOD LEFT ARM Performed at [facility] [address] [place], [place] [address]    Special Requests   Final    BOTTLES DRAWN AEROBIC AND ANAEROBIC Blood Culture results may not be optimal due to an inadequate volume of blood received in culture bottles Performed at [facility] [address], [place], [place] [address]    Culture   Final    NO GROWTH 5 DAYS Performed at [facility], [place] [address] [place], [place] [address]    Report Status 03/07/2018 FINAL  Final  Culture, blood (routine x 2)     Status: None   Collection Time: 03/01/18  4:54 PM  Result Value Ref Range Status   Specimen Description   Final    BLOOD RIGHT ANTECUBITAL Performed at [facility] [address], [place], [place] [address]    Special Requests   Final    BOTTLES DRAWN AEROBIC AND ANAEROBIC Blood Culture adequate volume Performed at [facility] [address] [place], [place] [address]    Culture   Final    NO GROWTH 5 DAYS Performed at [facility], [place] [address] [place], [place] [address]    Report Status 03/07/2018 FINAL  Final  Culture, Urine     Status: None   Collection Time: 03/01/18  7:51 PM  Result Value Ref Range Status   Specimen Description   Final    URINE, CLEAN CATCH Performed at [facility], [place] [address], [place], [place] [address]    Special Requests   Final    NONE Performed at [facility], [place] [address], [place], [place] [address]    Culture   Final    NO GROWTH Performed at [facility], [place] [address] [place],  [address]    Report Status 03/03/2018 FINAL  Final     ANTIBIOTICS: Cefepime/metronidazole/vancomycin (04/13/2018>>)  SIGNIFICANT EVENTS: 04/12/2018: Presented to [facility] emergency department with abdominal pain, altered mental status, weakness.  Admitted with a working diagnosis of abdominal sepsis.  Imaging raises question of possible occlusion of her biliary stent.  LINES/TUBES: R Mediport   ASSESSMENT / PLAN: Principal Problem:   Sepsis ([place]) Active Problems:   Generalized abdominal pain   Hyponatremia   Hyperammonemia (HCC)   Lactic acidosis   Hypotension   Malignant obstructive jaundice (HCC)   Extensive stage primary small cell carcinoma of lung (HCC)   Abnormal liver function   Liver metastases (HCC)   Bone metastasis (HCC)   Abnormal head CT   Adrenal nodule (HCC)   Thrombocytosis ([place])   By systems: INFECTIOUS  Sepsis, suspected to be of abdominal/hepatobiliary origin Continue empiric antibiotics: Cefepime/metronidazole/vancomycin Follow up blood and urine cultures. Continue IV fluid bolus. Trend lactate.  GASTROINTESTINAL  Suspected abdominal sepsis  Malignant obstructive jaundice  S/P biliary stent placement, questionable patency GI prophylaxis: famotidine  CARDIOVASCULAR  Hypotension Continue IV fluids.    HEMATOLOGIC  Leukocytosis  Thrombocytosis DVT prophylaxis: Heparin    RENAL  Hyponatremia, probably chronic moderate hypovolemic hyponatremia (initial Na 124 and previously hyponatremic during hospitalization on 03/16/2018) Check serum and urine osmolality. UNa and UCl may not be helpful at this time as the patient is getting IV fluid resuscitation for presumed sepsis.  Repeat BMP now and q 4 hours.  Check UNa and UCl in am after completion of IV fluids. Check cortisol level. The patient is noted to have hydrocortisone on her home medications list. Will also presumptively start her on stress dose steroids after drawing the random cortisol level.  NEUROLOGIC  Cancer pain  Altered  mental status  Abnormal head CT, chronic Morphine 2 mg IV every 2 hours as needed for cancer pain.  Hold dose for respiratory rate < 12 or any deterioration in mental status (including sleep).  PULMONARY  Small cell carcinoma the lung, extended stage.  Primary lesion was likely a right infrahilar mass.  Metastatic lesions to liver (biopsy confirmed), bone (pathologic L2 fracture) and possibly left adrenal (nodule on CT) and brain (abnormal head CT).  ENDOCRINE  No acute issues   Admit to ICU under my service (Attending: Renee Pain, MD) with the diagnoses highlighted above in the active Hospital Problem List (ASSESSMENT).  If she remains hemodynamically stable throughout the night, she should be able to transfer out of ICU in am.  In fact, she may need care from a facility that will be able to provide intervention for her biliary stent.  CODE STATUS: FULL CODE    FAMILY  - Updates: one brother at bedside, updated. Patient's questions answered.  - Inter-disciplinary family meet or Palliative Care meeting due by:  04/19/2018  Critical care time: 90 minutes.  The treatment and management of the patient's condition was required based on the threat of imminent deterioration. This time reflects time spent by the physician evaluating, providing care and managing the critically ill patient's care. The time was spent at the immediate bedside (or on the same floor/unit and dedicated to this patient's care). Time involved in separately billable procedures is NOT included int he critical care time indicated above. Family meeting and update time may be included above if and only if the patient is unable/incompetent to participate in clinical interview and/or decision making, and the discussion was necessary to determining treatment decisions.  Renee Pain, MD Board Certified by the ABIM,  Pulmonary Diseases & [place]  Pager: [phone]  04/13/2018, 1:57 AM

## 2018-04-13 NOTE — ED Notes (Signed)
ED Provider at bedside to discuss plan of care with patient son at bedside and son via telephone.

## 2018-04-13 NOTE — Progress Notes (Addendum)
PULMONARY / CRITICAL CARE MEDICINE   Name: Haley [name] MRN: [id number] DOB: 03/17/1951    ADMISSION DATE:  04/12/2018 CONSULTATION DATE:  04/12/18  REFERRING MD:  Dr. Jeanell Sparrow / EDP   CHIEF COMPLAINT:  AMS, Fever, Weakness   BRIEF SUMMARY: 67 y/o F, former smoker, who presented to [facility] on 8/18 with approximately 24 hours of abdominal pain, fever, weakness and altered mental status.  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  CT of abd/pelvis 8/18 raised concern for patency of the stent with increased biliary ductal dilatation.  Admitted 8/18 with concern for possible abdominal sepsis, hyponatremia.    SUBJECTIVE:  Tmax 103.6.  I/O- positive 2.6L since admit.  RN reports pt making urine/had BM this am, blood pressure improved.  Pt states she feels much better.  States morphine helping with pain.   VITAL SIGNS: BP (!) 90/54   Pulse 71   Temp 98.2 F (36.8 C) (Oral)   Resp 14   Ht 5\' 3"  (1.6 m)   Wt 56.2 kg   SpO2 96%   BMI 21.95 kg/m   HEMODYNAMICS:    VENTILATOR SETTINGS:    INTAKE / OUTPUT: I/O last 3 completed shifts: In: 3452.9 [P.O.:150; I.V.:2.9; IV Piggyback:3300] Out: 851 [Urine:850; Stool:1]  PHYSICAL EXAMINATION: General: chronically ill appearing female in NAD lying in bed Neuro:  AAOx4, speech clear, occasional difficulty with getting thoughts out / delayed but appropriate  HEENT:  MM pink/dry, no jvd  Cardiovascular:  s1s2 rrr, NSR on monitor in 70's Lungs:  Even/non-labored on room air, lungs bilaterally clear Abdomen: soft/bsx4 hypoactive, tolerating some liquids  Musculoskeletal:  No acute deformities  Skin:  Warm/dry, no edema  LABS:  BMET Recent Labs  Lab 04/12/18 2151 04/13/18 0324 04/13/18 0631  NA 124* 129* 131*  K 3.8 2.6* 2.6*  CL 91* 97* 97*  CO2 22 22 21*  BUN  9 7* 7*  CREATININE 0.40* <0.30* 0.33*  GLUCOSE 199* 134* 116*    Electrolytes Recent Labs  Lab 04/12/18 2151 04/13/18 0324 04/13/18 0631 04/13/18 0640  CALCIUM 8.3* 7.8* 7.4*  --   MG  --   --  1.4*  --   PHOS  --  1.9* 2.6 1.8*    CBC Recent Labs  Lab 04/12/18 2151 04/13/18 0307  WBC 13.8* 15.6*  HGB 12.6 11.5*  HCT 36.7 33.7*  PLT 436* 318    Coag's No results for input(s): APTT, INR in the last 168 hours.  Sepsis Markers Recent Labs  Lab 04/12/18 2359 04/13/18 0340 04/13/18 0631  LATICACIDVEN 3.14* 2.0* 1.4    ABG No results for input(s): PHART, PCO2ART, PO2ART in the last 168 hours.  Liver Enzymes Recent Labs  Lab 04/12/18 2151 04/13/18 0324 04/13/18 0631  AST 282*  --   --   ALT 204*  --   --   ALKPHOS 519*  --   --   BILITOT 3.6*  --   --   ALBUMIN 3.1* 2.7* 2.4*    Cardiac Enzymes No results for input(s): TROPONINI, PROBNP in the last 168 hours.  Glucose No results for input(s): GLUCAP in the last 168 hours.  Imaging Ct Head Wo Contrast  Result Date: 04/12/2018 CLINICAL DATA:  History of liver cancer and small cell lung cancer. Last chemotherapy 2 weeks ago. Abdominal pain and altered mental status. EXAM: CT HEAD  WITHOUT CONTRAST TECHNIQUE: Contiguous axial images were obtained from the base of the skull through the vertex without intravenous contrast. COMPARISON:  02/24/2018 FINDINGS: Brain: No mass effect or midline shift. No abnormal extra-axial fluid collections. No ventricular dilatation. There is vague low-attenuation change in the left anterior frontal white matter. This is similar to previous study and likely represents an area of small vessel ischemic change. If there is high clinical suspicion for intracranial metastasis, then contrast-enhanced MR would be the more sensitive examination. Gray-white matter junctions are distinct. Basal cisterns are not effaced. No acute intracranial hemorrhage. Vascular: Mild intracranial arterial  calcifications are present. Skull: Calvarium appears intact. No acute depressed skull fractures or destructive bone lesions identified. Sinuses/Orbits: There is an air-fluid level in the left sphenoid sinus, similar previous study. This is likely inflammatory. Paranasal sinuses and mastoid air cells are otherwise clear. Other: None. IMPRESSION: No acute intracranial abnormalities. Focal low-attenuation change in the left anterior frontal white matter is similar to previous study and likely represents an area of small vessel ischemic change. If there is high clinical suspicion for intracranial metastasis, then contrast-enhanced MR would be the more sensitive examination. Electronically Signed   By: Lucienne Capers M.D.   On: 04/12/2018 23:24   Ct Abdomen Pelvis W Contrast  Result Date: 04/12/2018 CLINICAL DATA:  Liver cancer abdominal pain EXAM: CT ABDOMEN AND PELVIS WITH CONTRAST TECHNIQUE: Multidetector CT imaging of the abdomen and pelvis was performed using the standard protocol following bolus administration of intravenous contrast. CONTRAST:  24mL ISOVUE-300 IOPAMIDOL (ISOVUE-300) INJECTION 61% COMPARISON:  MRI 02/25/2018, PET-CT 12/26/2017, CT 05/23/2017 FINDINGS: Lower chest: Lung bases demonstrate tiny pleural effusions. Passive atelectasis in the posterior left lung base. Persistent collapse in the right lower lobe with occlusion of the bronchus as before. Heart size within normal limits. Coronary vascular calcification. Hepatobiliary: Increased intra hepatic biliary dilatation since prior study. Increased size of hypodense metastatic liver lesions. Index posterior right hepatic lobe lesion now measures 23 mm compared with 10 mm previously. A left lobe lesion measures 16 mm, compared with 9 mm previously. Decreased pneumobilia with only minimal foci of pneumobilia evident. No air visible within the biliary stent except at its duodenal termination. Minimal wall thickening of the gallbladder. No  calcified stones. Pancreas: No inflammatory changes.  No measurable mass. Spleen: Normal in size without focal abnormality. Adrenals/Urinary Tract: Right adrenal gland is within normal limits. Subtle increased nodularity of the left adrenal gland no hydronephrosis. The bladder is distended Stomach/Bowel: The stomach is nonenlarged. No dilated small bowel. No colon wall thickening. Vascular/Lymphatic: Moderate to marked aortic atherosclerosis. No aneurysmal dilatation. No definitive adenopathy. Reproductive: Uterus and bilateral adnexa are unremarkable. Other: Negative for free air. Tiny amount of free fluid in the pelvis. Musculoskeletal: Extensive skeletal metastatic disease. Vertically oriented fracture through L2 with retropulsion and moderate canal stenosis as before. IMPRESSION: 1. Biliary stent in place. Overall decreased pneumobilia since prior CT with minimal foci of gas present. Interval increase in intra hepatic biliary dilatation; constellation of findings is concerning for stent occlusion. 2. Increased size of hepatic metastatic lesions consistent with disease progression. 3. Right infrahilar soft tissue density/presumed mass and collapse of the right lower lobe as before. Trace pleural effusion 4. Subtle increased nodularity of the left adrenal gland is also concerning for metastatic disease. 5. Widespread skeletal metastatic disease with pathologic L2 fracture, retropulsion, and canal stenosis as noted on the prior exams. Electronically Signed   By: Donavan Foil M.D.   On: 04/12/2018 23:40  Dg Chest Port 1 View  Result Date: 04/12/2018 CLINICAL DATA:  Fever and cough EXAM: PORTABLE CHEST 1 VIEW COMPARISON:  03/01/2018, 12/19/2017 FINDINGS: Right-sided central venous port tip overlies the cavoatrial region. No acute airspace disease or effusion. Stable cardiomediastinal silhouette. No pneumothorax. Minimal atelectasis or scar at the left base. IMPRESSION: No active disease. Electronically Signed    By: Donavan Foil M.D.   On: 04/12/2018 21:53     STUDIES:  U. Chloride 8/18 >> 81 U. Sodium 8/18 >> 68 FOBT 8/18 >> negative  CT Head 8/18 >> no acute abnormalities, focal low-attenuation change in the left anterior frontal white matter similar to previous study, likely represents small vessel ischemic changes > if high suspicion for intracranial metastasis then MRI would be more sensitive  CT ABD/Pelvis 8/18 >> biliary stent in place, overall decreased pneumobilia since prior CT, increase in intra hepatic biliary dilation concerning for stent occlusion.  Increased size of hepatic metastatic lesions consistent with disease progression.  Right infrahilar soft tissue density/presumed mass & collapse of the RLL, trace pleural effusion.  Subtle increased nodularity of the left adrenal gland concerning for metastatic disease, widespread skeletal metastatic disease with pathologic L2 fracture, retropulsion and canal stenosis as noted on prior exams.   CULTURES: UA 8/18 >> negative  BCx2 8/18 >>  ANTIBIOTICS: Flagyl 8/18 >> Vanco 8/18 >>  Cefepime 8/18 >>   SIGNIFICANT EVENTS: 8/18  Admit with ABD pain, fever, AMS.   8/19  Improved BP, pain controlled with morphine   LINES/TUBES: R Chest [facility] 8/19 >>   DISCUSSION: 67 y/o F with extensive stage small cell lung cancer with metastatic disease admitted with abdominal pain, fever, AMS, decreased PO intake.  Ruling out sepsis. Concern for biliary stent obstruction on CT with concern for increase in cancer burden vs changes post chemo.    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 If she will need transfer to [facility] for stent intervention > defer to GI  PRN zofran for nausea   Shock - rule out sepsis with possible biliary source, +/-  hypovolemia (hx poor intake, anxiety/depression), resolved with IVF. P:  Follow cultures as above ABX as ordered, D2/x Change to SDU monitoring  Stress dose steroids > transition back to home steroids 8/20  Extensive Stage Small Cell Lung Cancer with Metastatic Disease (bone, liver, pancreas, concern for new mets to adrenal).  Right Hilar Lung Mass  Bilateral Pleural Effusions P:   Have called [facility] for input regarding patients status > Dr. Irene Limbo reviewed scans and recommends to sort out biliary stent issues first.  May be progression of disease on scan vs lymphatic infiltration after chemo but this can be revisited as an outpatient. O2 if needed to support sats > 90% Pulmonary hygiene   Hyponatremia  Hypokalemia Hypophosphatemia Hypomagnesemia  P:   Trend BMP / urinary output Replace electrolytes as indicated, K/Mg/Phos 8/19 Avoid nephrotoxic agents, ensure adequate renal perfusion   Leukocytosis  Anemia - mild, suspect in setting of chronic disease P:  Trend CBC  Heparin for DVT prophylaxis  NEUROLOGIC A:   Pain - cancer related  Acute Metabolic Encephalopathy - resolved, in setting of fever, hypotension Anxiety / Depression  P:   PRN morphine for pain  Consider downgrade pain to home oxycodone 8/20  Follow neuro exam Consider outpatient  evaluation for metastatic brain disease    FAMILY  - Updates: Patient updated on plan of care 8/19.  Son Ramonita Lab [phone]) updated via phone.    - Inter-disciplinary family meet or Palliative Care meeting due by:  Ongoing.    - Global:  Transfer to SDU status, to [facility] as of 8/20 am.    Noe Gens, NP-C [facility] Pgr: [phone] or if no answer [phone] 04/13/2018, 9:26 AM

## 2018-04-13 NOTE — Assessment & Plan Note (Signed)
With pathologic fracture L2

## 2018-04-13 NOTE — Progress Notes (Signed)
HEMATOLOGY/ONCOLOGY INPATIENT PROGRESS NOTE  Date of Service: 04/13/2018  Inpatient Attending: .Laurin Coder, MD   SUBJECTIVE:   Haley [name] is accompanied today by her son at bedside.  The pt reports that she had not been eating and drinking well while at home, due to her stomach pain and nausea. She notes that she is able to drink better today and is currently on a clear liquid diet. The pt notes that her symptoms are currently well controlled and is eagerly anticipating further GI workup.   Lab results today (04/13/18) of CBC is as follows: all values are WNL except for WBC at 15.6k, HGB at 11.5, HCT at 33.7.  On review of systems, pt reports decreased abdominal pain, decreased nausea, and denies abdominal pains, fever, chills, and any other symptoms.    OBJECTIVE:  Mild distress from anxiety and abdominal pain  PHYSICAL EXAMINATION: . Vitals:   04/13/18 1300 04/13/18 1400 04/13/18 1500 04/13/18 1600  BP: (!) 96/55 (!) 88/41 (!) 75/39 102/62  Pulse: 76 70 70 75  Resp: (!) 24 18 18  (!) 24  Temp:      TempSrc:      SpO2: 95% 93% 93% 92%  Weight:      Height:       Filed Weights   04/12/18 2108 04/13/18 0445  Weight: 123 lb (55.8 kg) 123 lb 14.4 oz (56.2 kg)   .Body mass index is 21.95 kg/m.  GENERAL:alert, in no acute distress and comfortable SKIN: skin color, texture, turgor are normal, no rashes or significant lesions EYES: normal, conjunctiva are pink and non-injected, sclera clear OROPHARYNX:no exudate, no erythema and lips, buccal mucosa, and tongue normal  NECK: supple, no JVD, thyroid normal size, non-tender, without nodularity LYMPH:  no palpable lymphadenopathy in the cervical, axillary or inguinal LUNGS: clear to auscultation with normal respiratory effort HEART: regular rate & rhythm,  no murmurs and no lower extremity edema ABDOMEN: abdomen mild TTP RUQ  Musculoskeletal: no cyanosis of digits and no clubbing  PSYCH: alert & oriented x 3  with fluent speech NEURO: no focal motor/sensory deficits  MEDICAL HISTORY:  Past Medical History:  Diagnosis Date  . Allergy   . Back pain   . Osteopenia   . 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 Tubular adenoma of colon 10/26/2014    SURGICAL HISTORY: Past Surgical History:  Procedure Laterality Date  . COLONOSCOPY    . COLONOSCOPY WITH PROPOFOL N/A 10/25/2014   Procedure: COLONOSCOPY WITH PROPOFOL;  Surgeon: Garlan Fair, MD;  Location: [facility];  Service: Endoscopy;  Laterality: N/A;  . ERCP N/A 10/15/2017   Procedure: ENDOSCOPIC RETROGRADE CHOLANGIOPANCREATOGRAPHY (ERCP);  Surgeon: Clarene Essex, MD;  Location: [facility];  Service: Endoscopy;  Laterality: N/A;  . IR FLUORO GUIDE PORT INSERTION RIGHT  11/10/2017  . IR RADIOLOGIST EVAL & MGMT  10/02/2017  . IR US GUIDE VASC ACCESS RIGHT  11/10/2017    SOCIAL HISTORY: Social History   Socioeconomic History  . Marital status: Single    Spouse name: Not on file  . Number of children: Not on file  . Years of education: Not on file  . Highest education level: Not on file  Occupational History  . Not on file  Social Needs  . Financial resource strain: Not on file  . Food insecurity:    Worry: Not on file    Inability: Not on file  . Transportation  needs:    Medical: Not on file    Non-medical: Not on file  Tobacco Use  . Smoking status: Former Smoker    Packs/day: 1.00    Years: 40.00    Pack years: 40.00    Types: Cigarettes    Last attempt to quit: 09/24/2017    Years since quitting: 0.5  . Smokeless tobacco: Never Used  Substance and Sexual Activity  . Alcohol use: Yes    Comment: rare social  . Drug use: No  . Sexual activity: Not Currently  Lifestyle  . Physical activity:    Days per week: Not on file    Minutes per session: Not on file  . Stress: Not on file  Relationships  . Social connections:    Talks on phone: Not on file    Gets  together: Not on file    Attends religious service: Not on file    Active member of club or organization: Not on file    Attends meetings of clubs or organizations: Not on file    Relationship status: Not on file  . Intimate partner violence:    Fear of current or ex partner: Not on file    Emotionally abused: Not on file    Physically abused: Not on file    Forced sexual activity: Not on file  Other Topics Concern  . Not on file  Social History Narrative  . Not on file    FAMILY HISTORY: Family History  Problem Relation Age of Onset  . Diabetes Mother   . Hypertension Mother   . Diabetes Sister   . Hypertension Sister   . Hyperlipidemia Sister     ALLERGIES:  has No Known Allergies.  MEDICATIONS:  Scheduled Meds: . Chlorhexidine Gluconate Cloth  6 each Topical Daily  . feeding supplement  1 Container Oral TID BM  . heparin  5,000 Units Subcutaneous Q8H  . hydrocortisone sod succinate (SOLU-CORTEF) inj  50 mg Intravenous Q6H  . mouth rinse  15 mL Mouth Rinse BID   Continuous Infusions: . sodium chloride 10 mL/hr at 04/13/18 0800  . sodium chloride 10 mL/hr at 04/13/18 1600  . cefTRIAXone (ROCEPHIN)  IV Stopped (04/13/18 1556)  . famotidine (PEPCID) IV Stopped (04/13/18 1129)  . metronidazole Stopped (04/13/18 1523)  . potassium PHOSPHATE IVPB (mEq) 42 mL/hr at 04/13/18 1600   PRN Meds:.sodium chloride, sodium chloride, morphine injection, ondansetron, sodium chloride flush  REVIEW OF SYSTEMS:    10 Point review of Systems was done is negative except as noted above.   LABORATORY DATA:  I have reviewed the data as listed  . CBC Latest Ref Rng & Units 04/13/2018 04/12/2018 03/23/2018  WBC 4.0 - 10.5 K/uL 15.6(H) 13.8(H) 5.9  Hemoglobin 12.0 - 15.0 g/dL 11.5(L) 12.6 12.2  Hematocrit 36.0 - 46.0 % 33.7(L) 36.7 36.1  Platelets 150 - 400 K/uL 318 436(H) 342    . CMP Latest Ref Rng & Units 04/13/2018 04/13/2018 04/12/2018  Glucose 70 - 99 mg/dL 116(H) 134(H) 199(H)    BUN 8 - 23 mg/dL 7(L) 7(L) 9  Creatinine 0.44 - 1.00 mg/dL 0.33(L) <0.30(L) 0.40(L)  Sodium 135 - 145 mmol/L 131(L) 129(L) 124(L)  Potassium 3.5 - 5.1 mmol/L 2.6(LL) 2.6(LL) 3.8  Chloride 98 - 111 mmol/L 97(L) 97(L) 91(L)  CO2 22 - 32 mmol/L 21(L) 22 22  Calcium 8.9 - 10.3 mg/dL 7.4(L) 7.8(L) 8.3(L)  Total Protein 6.5 - 8.1 g/dL - - 7.0  Total Bilirubin 0.3 - 1.2 mg/dL - -  3.6(H)  Alkaline Phos 38 - 126 U/L - - 519(H)  AST 15 - 41 U/L - - 282(H)  ALT 0 - 44 U/L - - 204(H)     RADIOGRAPHIC STUDIES: I have personally reviewed the radiological images as listed and agreed with the findings in the report. Ct Head Wo Contrast  Result Date: 04/12/2018 CLINICAL DATA:  History of liver cancer and small cell lung cancer. Last chemotherapy 2 weeks ago. Abdominal pain and altered mental status. EXAM: CT HEAD WITHOUT CONTRAST TECHNIQUE: Contiguous axial images were obtained from the base of the skull through the vertex without intravenous contrast. COMPARISON:  02/24/2018 FINDINGS: Brain: No mass effect or midline shift. No abnormal extra-axial fluid collections. No ventricular dilatation. There is vague low-attenuation change in the left anterior frontal white matter. This is similar to previous study and likely represents an area of small vessel ischemic change. If there is high clinical suspicion for intracranial metastasis, then contrast-enhanced MR would be the more sensitive examination. Gray-white matter junctions are distinct. Basal cisterns are not effaced. No acute intracranial hemorrhage. Vascular: Mild intracranial arterial calcifications are present. Skull: Calvarium appears intact. No acute depressed skull fractures or destructive bone lesions identified. Sinuses/Orbits: There is an air-fluid level in the left sphenoid sinus, similar previous study. This is likely inflammatory. Paranasal sinuses and mastoid air cells are otherwise clear. Other: None. IMPRESSION: No acute intracranial  abnormalities. Focal low-attenuation change in the left anterior frontal white matter is similar to previous study and likely represents an area of small vessel ischemic change. If there is high clinical suspicion for intracranial metastasis, then contrast-enhanced MR would be the more sensitive examination. Electronically Signed   By: Lucienne Capers M.D.   On: 04/12/2018 23:24   Ct Abdomen Pelvis W Contrast  Result Date: 04/12/2018 CLINICAL DATA:  Liver cancer abdominal pain EXAM: CT ABDOMEN AND PELVIS WITH CONTRAST TECHNIQUE: Multidetector CT imaging of the abdomen and pelvis was performed using the standard protocol following bolus administration of intravenous contrast. CONTRAST:  39mL ISOVUE-300 IOPAMIDOL (ISOVUE-300) INJECTION 61% COMPARISON:  MRI 02/25/2018, PET-CT 12/26/2017, CT 05/23/2017 FINDINGS: Lower chest: Lung bases demonstrate tiny pleural effusions. Passive atelectasis in the posterior left lung base. Persistent collapse in the right lower lobe with occlusion of the bronchus as before. Heart size within normal limits. Coronary vascular calcification. Hepatobiliary: Increased intra hepatic biliary dilatation since prior study. Increased size of hypodense metastatic liver lesions. Index posterior right hepatic lobe lesion now measures 23 mm compared with 10 mm previously. A left lobe lesion measures 16 mm, compared with 9 mm previously. Decreased pneumobilia with only minimal foci of pneumobilia evident. No air visible within the biliary stent except at its duodenal termination. Minimal wall thickening of the gallbladder. No calcified stones. Pancreas: No inflammatory changes.  No measurable mass. Spleen: Normal in size without focal abnormality. Adrenals/Urinary Tract: Right adrenal gland is within normal limits. Subtle increased nodularity of the left adrenal gland no hydronephrosis. The bladder is distended Stomach/Bowel: The stomach is nonenlarged. No dilated small bowel. No colon wall  thickening. Vascular/Lymphatic: Moderate to marked aortic atherosclerosis. No aneurysmal dilatation. No definitive adenopathy. Reproductive: Uterus and bilateral adnexa are unremarkable. Other: Negative for free air. Tiny amount of free fluid in the pelvis. Musculoskeletal: Extensive skeletal metastatic disease. Vertically oriented fracture through L2 with retropulsion and moderate canal stenosis as before. IMPRESSION: 1. Biliary stent in place. Overall decreased pneumobilia since prior CT with minimal foci of gas present. Interval increase in intra hepatic biliary dilatation; constellation  of findings is concerning for stent occlusion. 2. Increased size of hepatic metastatic lesions consistent with disease progression. 3. Right infrahilar soft tissue density/presumed mass and collapse of the right lower lobe as before. Trace pleural effusion 4. Subtle increased nodularity of the left adrenal gland is also concerning for metastatic disease. 5. Widespread skeletal metastatic disease with pathologic L2 fracture, retropulsion, and canal stenosis as noted on the prior exams. Electronically Signed   By: Donavan Foil M.D.   On: 04/12/2018 23:40   Dg Chest Port 1 View  Result Date: 04/12/2018 CLINICAL DATA:  Fever and cough EXAM: PORTABLE CHEST 1 VIEW COMPARISON:  03/01/2018, 12/19/2017 FINDINGS: Right-sided central venous port tip overlies the cavoatrial region. No acute airspace disease or effusion. Stable cardiomediastinal silhouette. No pneumothorax. Minimal atelectasis or scar at the left base. IMPRESSION: No active disease. Electronically Signed   By: Donavan Foil M.D.   On: 04/12/2018 21:53    ASSESSMENT & PLAN:  67 y.o. female with  1. 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  Liver mets + Bone mets + adrenal mets Pancreatic mets- FNA confirmed MRI  brain 10/29/2017 after this clinic visit showed concern for multiple asymptomatic subcentimeter brain mets. 11/28/17 Brain MRI which revealed stable lesions, and new nonenhancing lesions. I discussed this imaging study with Dr Ulis Rias our neuro-oncologist -- no area of critical brain mets that needs patient to rush into WBRT at this time.  12/26/17 PET which revealed Marked improvement in the previously demonstrated widespread hypermetabolic metastatic disease. There is only mild residual hypermetabolic activity within some of the osseous lesions.   patient has completed 4 cycles of carboplatin/etoposide/Atezolizumab and completed -completed WBRT 30 Gy in 10 fractions 01/15/2018 - 01/29/2018   CT abd 8/18-- concern for progression of mets in liver.  2. Hyponatremia - poor po intake + adrenal insufficiency PLAN IVF and stress dose steroids  3.s/p previous obstructive Jaundice due to biliary compression from pancreatic mass  -ERCP attempted by Dr Watt Climes - not successful -ERCP completed on 10/24/17 with Dr. Harley Hallmark with much improved LFTs -Bx of  pancreatic lesion -consistent with small cell lung cancer. Now with abnormal LFTs and concerns with stent blockages and possible biliary infection.  4. Back pain related to pathologic L2 fracture (causing elevated alkaline phosphatase levels.  5. Moderate to severe protein malnutrition due to weight loss -resolved  7. Sepsis with Enterobacteriaceae and Klebsiella pneumnoias - likely hepatobiliary source in the setting of abnormal LFTs and likely blocked biliary stent. PLAN -discussed current lab and imaging findings with patient and son at bedside and answered their multiple quesitions -appreciate care from critical care team and GI. -Discussed with pt that I will defer care of her sepsis to the ICU team and that we will prioritize her cancer treatment after her stent obstruction and infection have both resolved. -goals of care discussion  had. -consideration for ERCP tomorrow. -will f/u as needed in the hospital.  The total time spent in the appt was 25 minutes and more than 50% was on counseling and direct patient cares.    Sullivan Lone MD MS AAHIVMS [facility] Hematology/Oncology Physician [facility]  (Office):       [phone] (Work cell):  [phone] (Fax):           [phone]  04/13/2018 4:59 PM  I, Baldwin [place], am acting as a Education administrator  for Dr. Irene Limbo  .I have reviewed the above documentation for accuracy and completeness, and I agree with the above. Sullivan Lone MD MS

## 2018-04-13 NOTE — Progress Notes (Signed)
PHARMACY - PHYSICIAN COMMUNICATION CRITICAL VALUE ALERT - BLOOD CULTURE IDENTIFICATION (BCID)  Haley [name] is an 67 y.o. female with metastatic lung cancer who presented to [facility] on 04/12/2018 with sepsis with suspected intra-abdominal source.  Name of physician (or Provider) Contacted: Noe Gens  Current antibiotics: vancomycin, cefepime and flagyl  Changes to prescribed antibiotics recommended:  Recommendations accepted by provider - change vancomycin/cefepime to ceftriaxone 2gm IV q24h  Results for orders placed or performed during the hospital encounter of 04/12/18  Blood Culture ID Panel (Reflexed) (Collected: 04/12/2018  9:27 PM)  Result Value Ref Range   Enterococcus species NOT DETECTED NOT DETECTED   Listeria monocytogenes NOT DETECTED NOT DETECTED   Staphylococcus species NOT DETECTED NOT DETECTED   Staphylococcus aureus NOT DETECTED NOT DETECTED   Streptococcus species NOT DETECTED NOT DETECTED   Streptococcus agalactiae NOT DETECTED NOT DETECTED   Streptococcus pneumoniae NOT DETECTED NOT DETECTED   Streptococcus pyogenes NOT DETECTED NOT DETECTED   Acinetobacter baumannii NOT DETECTED NOT DETECTED   Enterobacteriaceae species DETECTED (A) NOT DETECTED   Enterobacter cloacae complex NOT DETECTED NOT DETECTED   Escherichia coli NOT DETECTED NOT DETECTED   Klebsiella oxytoca NOT DETECTED NOT DETECTED   Klebsiella pneumoniae DETECTED (A) NOT DETECTED   Proteus species NOT DETECTED NOT DETECTED   Serratia marcescens NOT DETECTED NOT DETECTED   Carbapenem resistance NOT DETECTED NOT DETECTED   Haemophilus influenzae NOT DETECTED NOT DETECTED   Neisseria meningitidis NOT DETECTED NOT DETECTED   Pseudomonas aeruginosa NOT DETECTED NOT DETECTED   Candida albicans NOT DETECTED NOT DETECTED   Candida glabrata NOT DETECTED NOT DETECTED   Candida krusei NOT DETECTED NOT DETECTED   Candida parapsilosis NOT DETECTED NOT DETECTED   Candida tropicalis NOT DETECTED NOT  DETECTED    Dia Sitter P 04/13/2018  11:52 AM

## 2018-04-13 NOTE — Assessment & Plan Note (Signed)
Tissue diagnosis was apparently made by liver biopsy (10/17/2017): CD56+, TTF-1 positive.  Weakly positive for chromogranin, cytokeratin AE1/AE3, synaptophysin.  CDX 2-, cytokeratin 20 negative, cytokeratin 7-, napsin-A negative.  Immunohistochemistry compatible with small cell carcinoma of lung primary.

## 2018-04-13 NOTE — Progress Notes (Signed)
Initial Nutrition Assessment  DOCUMENTATION CODES:   Severe malnutrition in context of chronic illness  INTERVENTION:   - Boost Breeze po TID, each supplement provides 250 kcal and 9 grams of protein (orange flavor)  - Once diet advanced, Ensure Enlive po TID, each supplement provides 350 kcal and 20 grams of protein  NUTRITION DIAGNOSIS:   Severe Malnutrition related to chronic illness, cancer and cancer related treatments (non-small cell lung cancer with mets to bone and liver) as evidenced by mild fat depletion, moderate fat depletion, mild muscle depletion, moderate muscle depletion, percent weight loss, energy intake < or equal to 75% for > or equal to 1 month (6.8% weight loss in < 2 months).  GOAL:   Patient will meet greater than or equal to 90% of their needs  MONITOR:   PO intake, Supplement acceptance, Labs, Weight trends, I & O's  REASON FOR ASSESSMENT:   Malnutrition Screening Tool    ASSESSMENT:   67 year old female who presented to the ED with abdominal pain, fever, and AMS. PMH significant for non small cell lung carcinoma with multiple metastases and history of obstructive jaundice secondary to biliary compression from pancreatic mass.  Pt is followed by [place] and was diagnosed with severe protein-calorie malnutrition on 10/28/17 related to weight loss and inadequate PO intake. [place] RD note on 03/23/18.  Spoke with pt at bedside who reports that "not much has changed" since her visit with [place] [facility] 3 weeks ago. Pt states that her appetite is poor and that all foods taste "weird" and have a "terrible aftertaste." Pt states that she has gained some weight back over the past few weeks. Pt states that she lost weight after she switched steroid medications because the new steroid "does nothing for my appetite."  Pt shares that she eats 4 small meals daily. An example of a meal would be a serving of yogurt or chicken soup. Pt states,  "I'm not eating as much as I should."  Pt endorses consuming 1 Ensure oral nutrition supplement daily which is less than she used to consume since the supplements "fill me up."  Pt agreeable to receiving Boost Breeze during current admission. RD to follow for diet advancement and order Ensure Enlive once diet advanced.  Per weight history in chart, pt has lost 9 lbs since 02/16/18. This is a 6.8% weight loss in less than 2 months which is significant for timeframe.  Medications reviewed and include: IV Pepcid, IV antibiotics, 10 mEq KCl every hour, 2 grams magnesium sulfate once, 20 mEq potassium phosphate once  Labs reviewed: sodium 131 (L), potassium 2.6 (L) - being replaced, chloride 97 (L), CO2 21 (L), BUN 7 (L), creatinine 0.33 (L), phosphorus 1.8 (L) - being replaced, magnesium 1.4 (L) - being replaced  NUTRITION - FOCUSED PHYSICAL EXAM:    Most Recent Value  Orbital Region  Moderate depletion  Upper Arm Region  Moderate depletion  Thoracic and Lumbar Region  Unable to assess  Buccal Region  Mild depletion  Temple Region  Mild depletion  Clavicle Bone Region  Moderate depletion  Clavicle and Acromion Bone Region  Moderate depletion  Scapular Bone Region  Unable to assess  Dorsal Hand  Mild depletion  Patellar Region  Moderate depletion  Anterior Thigh Region  Moderate depletion  Posterior Calf Region  Moderate depletion  Edema (RD Assessment)  None  Hair  Reviewed  Eyes  Reviewed  Mouth  Reviewed  Skin  Reviewed  Nails  Reviewed       Diet Order:   Diet Order            Diet clear liquid Room service appropriate? Yes; Fluid consistency: Thin  Diet effective now              EDUCATION NEEDS:   No education needs have been identified at this time  Skin:  Skin Assessment: Reviewed RN Assessment  Last BM:  04/13/18 - large type 4  Height:   Ht Readings from Last 1 Encounters:  04/13/18 5\' 3"  (1.6 m)    Weight:   Wt Readings from Last 1 Encounters:   04/13/18 56.2 kg    Ideal Body Weight:  52.27 kg  BMI:  Body mass index is 21.95 kg/m.  Estimated Nutritional Needs:   Kcal:  1800-2000  Protein:  85-100 grams  Fluid:  1.8-2.0 L    Gaynell Face, MS, RD, LDN Pager: [phone] Weekend/After Hours: [phone]

## 2018-04-13 NOTE — H&P (View-Only) (Signed)
[facility] CONSULT Reason for consult: Obstructed biliary stent Referring Physician: [facility] hospitalist.  PCP: Dr. Buelah Manis.  Oncologist: Dr. Irene Limbo.  Haley [name] is an 67 y.o. female.  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  She has had mets to the liver, bone, adrenal glands and pancreas that have been confirmed by FNA.  There was a question of multiple brain mets as well.  She is completed 4 cycles of chemotherapy and repeat PET scan has shown some improvement.  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  She had a ERCP, sphincterotomy and attempted placement of biliary stent by Dr. Watt Climes a few months ago.  Unfortunately this was unsuccessful and she was referred to [facility] where she underwent stent placement.  I was able to review the records and a summary from [facility] and she apparently had a wall flex uncovered stent placed size 808.  This is done fairly well for a while.  She is continued to follow-up with Dr. Irene Limbo for continued chemotherapy.  Recent MRCP showed interval improvement from prior MRI exams with the pancreatic head mass improved and near complete resolution of the hepatic metastatic disease.  At the time that this procedure was done about a month ago she had stable intra-and extrahepatic ductal dilatation. She was doing well earlier yesterday and then began to have some pain and to get weaker.  She was brought to the ED via EMS and at the time was barely responsive noncommunicative.  Her temperature was 103.6 respiration rate 16.  She was discussed with me last night and the question was whether to transfer her to [facility] or continue to treat her here in [place].  Due to her instability we felt it was best to keep her here.  She was admitted to critical care and started on broad-spectrum antibiotics.  Labs reveal bilirubin of 3.6 WBC 15.6.  CT scan showed increase ductal dilatation as well as increase  hepatic metastasis.  This was felt to be consistent with an occluded stent.Patient was started on antibiotics and her blood cultures returned showing Klebsiella pneumonia 4 of 4 bottles.  Her antibiotic therapy has been narrowed to Rocephin.  Past Medical History:  Diagnosis Date  . Allergy   . Back pain   . Osteopenia   . Small cell lung cancer ([place]) 10/17/2017   Extended stage with metastases to liver and bone  . Tubular adenoma of colon 10/26/2014    Past Surgical History:  Procedure Laterality Date  . COLONOSCOPY    . COLONOSCOPY WITH PROPOFOL N/A 10/25/2014   Procedure: COLONOSCOPY WITH PROPOFOL;  Surgeon: Garlan Fair, MD;  Location: [facility];  Service: Endoscopy;  Laterality: N/A;  . ERCP N/A 10/15/2017   Procedure: ENDOSCOPIC RETROGRADE CHOLANGIOPANCREATOGRAPHY (ERCP);  Surgeon: Clarene Essex, MD;  Location: [facility];  Service: Endoscopy;  Laterality: N/A;  . IR FLUORO GUIDE PORT INSERTION RIGHT  11/10/2017  . IR RADIOLOGIST EVAL & MGMT  10/02/2017  . IR US GUIDE VASC ACCESS RIGHT  11/10/2017    Family History  Problem Relation Age of Onset  . Diabetes Mother   . Hypertension Mother   . Diabetes Sister   . Hypertension Sister   . Hyperlipidemia Sister     Social History:  reports that she quit smoking about 6 months ago. Her smoking use included cigarettes. She has a 40.00 pack-year smoking history. She has never used smokeless tobacco. She reports that she drinks alcohol. She reports  that she does not use drugs.  Allergies: No Known Allergies  Medications; Prior to Admission medications   Medication Sig Start Date End Date Taking? Authorizing Provider  calcium carbonate (OS-CAL - DOSED IN MG OF ELEMENTAL CALCIUM) 1250 (500 Ca) MG tablet Take 1 tablet by mouth daily.    Yes [provider]  hydrocortisone (CORTEF) 10 MG tablet Take 2 tablets (20 mg total) by mouth daily with breakfast. And 63m po daily with lunch. Patient taking differently: Take 10-20  mg by mouth See admin instructions. 20 mg every morning And 151mwith lunch. 03/23/18  Yes KaBrunetta GeneraMD  lidocaine-prilocaine (EMLA) cream Apply to affected area once 10/22/17  Yes KaBrunetta GeneraMD  LORazepam (ATIVAN) 0.5 MG tablet Take 1 tablet (0.5 mg total) by mouth every 6 (six) hours as needed (Nausea or vomiting). 10/22/17  Yes KaBrunetta GeneraMD  magic mouthwash w/lidocaine SOLN Take 10 mLs by mouth 4 (four) times daily as needed for mouth pain. Rinse and spit or swallow 12/08/17  Yes Tanner, VaLyndon Code PA-C  metoCLOPramide (REGLAN) 10 MG tablet Take 1 tablet (10 mg total) by mouth every 8 (eight) hours as needed for nausea. 03/16/18  Yes Tanner, VaLyndon Code PA-C  Multiple Vitamins-Minerals (MULTIVITAMIN WITH MINERALS) tablet Take 1 tablet by mouth daily.   Yes [provider]  ondansetron (ZOFRAN) 4 MG tablet Take 1 tablet (4 mg total) by mouth every 8 (eight) hours as needed for nausea or vomiting. 10/18/17  Yes KrBonnielee HaffMD  oxyCODONE (OXY IR/ROXICODONE) 5 MG immediate release tablet Take 1-2 tablets (5-10 mg total) by mouth every 4 (four) hours as needed for moderate pain or severe pain. 03/23/18  Yes KaBrunetta GeneraMD  polyethylene glycol (MSierra Nevada Memorial Hospitalpacket Take 17 g by mouth daily as needed for moderate constipation. 10/18/17  Yes KrBonnielee HaffMD  prochlorperazine (COMPAZINE) 10 MG tablet Take 1 tablet (10 mg total) by mouth every 6 (six) hours as needed (Nausea or vomiting). 10/22/17  Yes KaBrunetta GeneraMD  senna-docusate (SENNA S) 8.6-50 MG tablet Take 2 tablets by mouth at bedtime. Patient taking differently: Take 2 tablets by mouth at bedtime as needed for mild constipation or moderate constipation.  09/30/17  Yes KaBrunetta GeneraMD  sucralfate (CARAFATE) 1 GM/10ML suspension Take 10 mLs (1 g total) by mouth 3 (three) times daily. With meals Patient not taking: Reported on 04/13/2018 04/06/18   DuAlycia RossettiMD   . Chlorhexidine  Gluconate Cloth  6 each Topical Daily  . feeding supplement  1 Container Oral TID BM  . heparin  5,000 Units Subcutaneous Q8H  . hydrocortisone sod succinate (SOLU-CORTEF) inj  50 mg Intravenous Q6H  . mouth rinse  15 mL Mouth Rinse BID   PRN Meds sodium chloride, sodium chloride, morphine injection, ondansetron, sodium chloride flush Results for orders placed or performed during the hospital encounter of 04/12/18 (from the past 48 hour(s))  Blood Culture (routine x 2)     Status: None (Preliminary result)   Collection Time: 04/12/18  9:27 PM  Result Value Ref Range   Specimen Description      BLOOD LEFT ANTECUBITAL Performed at [facility] [place] [id number]  Special Requests      BOTTLES DRAWN AEROBIC AND ANAEROBIC Blood Culture adequate volume Performed at [facility] [place] [id number]  Culture  Setup Time  GRAM NEGATIVE RODS IN BOTH AEROBIC AND ANAEROBIC BOTTLES Organism ID to follow CRITICAL RESULT CALLED TO, READ BACK BY AND VERIFIED WITH: Guadlupe Spanish PharmD 11:35 04/13/18 ([email or web address]) Performed at [facility], [place] [address], [place], [place] [address]    Culture GRAM NEGATIVE RODS    Report Status PENDING   Blood Culture ID Panel (Reflexed)     Status: Abnormal   Collection Time: 04/12/18  9:27 PM  Result Value Ref Range   Enterococcus species NOT DETECTED NOT DETECTED   Listeria monocytogenes NOT DETECTED NOT DETECTED   Staphylococcus species NOT DETECTED NOT DETECTED   Staphylococcus aureus NOT DETECTED NOT DETECTED   Streptococcus species NOT DETECTED NOT DETECTED   Streptococcus agalactiae NOT DETECTED NOT DETECTED   Streptococcus pneumoniae NOT DETECTED NOT DETECTED   Streptococcus pyogenes NOT DETECTED NOT DETECTED   Acinetobacter baumannii NOT DETECTED NOT DETECTED   Enterobacteriaceae species DETECTED (A) NOT DETECTED    Comment: Enterobacteriaceae represent  a large family of gram-negative bacteria, not a single organism. CRITICAL RESULT CALLED TO, READ BACK BY AND VERIFIED WITH: Guadlupe Spanish PharmD 11:35 04/13/18 ([email or web address])    Enterobacter cloacae complex NOT DETECTED NOT DETECTED   Escherichia coli NOT DETECTED NOT DETECTED   Klebsiella oxytoca NOT DETECTED NOT DETECTED   Klebsiella pneumoniae DETECTED (A) NOT DETECTED    Comment: CRITICAL RESULT CALLED TO, READ BACK BY AND VERIFIED WITH: Guadlupe Spanish PharmD 11:35 04/13/18 ([email or web address])    Proteus species NOT DETECTED NOT DETECTED   Serratia marcescens NOT DETECTED NOT DETECTED   Carbapenem resistance NOT DETECTED NOT DETECTED   Haemophilus influenzae NOT DETECTED NOT DETECTED   Neisseria meningitidis NOT DETECTED NOT DETECTED   Pseudomonas aeruginosa NOT DETECTED NOT DETECTED   Candida albicans NOT DETECTED NOT DETECTED   Candida glabrata NOT DETECTED NOT DETECTED   Candida krusei NOT DETECTED NOT DETECTED   Candida parapsilosis NOT DETECTED NOT DETECTED   Candida tropicalis NOT DETECTED NOT DETECTED    Comment: Performed at [place] [address], [place], [place] [address]  POC occult blood, ED Provider will collect     Status: None   Collection Time: 04/12/18  9:35 PM  Result Value Ref Range   Fecal Occult Bld NEGATIVE NEGATIVE  Cortisol     Status: None   Collection Time: 04/12/18  9:50 PM  Result Value Ref Range   Cortisol, Plasma 41.8 ug/dL    Comment: (NOTE) AM    6.7 - 22.6 ug/dL PM   <10.0       ug/dL Performed at [facility] [address] [place], [place] [address]   Prealbumin     Status: Abnormal   Collection Time: 04/12/18  9:50 PM  Result Value Ref Range   Prealbumin 11.4 (L) 18 - 38 mg/dL    Comment: Performed at [facility] [place] [address], [place], [place] [address]  Lipase, blood     Status: None   Collection Time: 04/12/18  9:51 PM  Result Value Ref Range   Lipase 26 11 - 51 U/L    Comment: Performed at [facility], [place] [address] [place], [place] [address]  Comprehensive metabolic panel     Status: Abnormal   Collection Time: 04/12/18  9:51 PM  Result Value Ref Range   Sodium 124 (L) 135 - 145 mmol/L   Potassium 3.8 3.5 - 5.1 mmol/L   Chloride 91 (L) 98 - 111 mmol/L   CO2  22 22 - 32 mmol/L   Glucose, Bld 199 (H) 70 - 99 mg/dL   BUN 9 8 - 23 mg/dL   Creatinine, Ser 0.40 (L) 0.44 - 1.00 mg/dL   Calcium 8.3 (L) 8.9 - 10.3 mg/dL   Total Protein 7.0 6.5 - 8.1 g/dL   Albumin 3.1 (L) 3.5 - 5.0 g/dL   AST 282 (H) 15 - 41 U/L   ALT 204 (H) 0 - 44 U/L   Alkaline Phosphatase 519 (H) 38 - 126 U/L   Total Bilirubin 3.6 (H) 0.3 - 1.2 mg/dL   GFR calc non Af Amer >60 >60 mL/min   GFR calc Af Amer >60 >60 mL/min    Comment: (NOTE) The eGFR has been calculated using the CKD EPI equation. This calculation has not been validated in all clinical situations. eGFR's persistently <60 mL/min signify possible Chronic Kidney Disease.    Anion gap 11 5 - 15    Comment: Performed at [facility], [place] [address], [place], [place] [address]  CBC     Status: Abnormal   Collection Time: 04/12/18  9:51 PM  Result Value Ref Range   WBC 13.8 (H) 4.0 - 10.5 K/uL   RBC 4.25 3.87 - 5.11 MIL/uL   Hemoglobin 12.6 12.0 - 15.0 g/dL   HCT 36.7 36.0 - 46.0 %   MCV 86.4 78.0 - 100.0 fL   MCH 29.6 26.0 - 34.0 pg   MCHC 34.3 30.0 - 36.0 g/dL   RDW 15.3 11.5 - 15.5 %   Platelets 436 (H) 150 - 400 K/uL    Comment: Performed at [facility], [place] [address] [place], [place] [address]  Ammonia     Status: Abnormal   Collection Time: 04/12/18 10:00 PM  Result Value Ref Range   Ammonia 58 (H) 9 - 35 umol/L    Comment: Performed at [facility], [place] [address], [place], [place] [address]  I-Stat CG4 Lactic Acid, ED  (not at  [facility])     Status: Abnormal   Collection Time: 04/12/18 10:03 PM  Result Value Ref Range   Lactic Acid, Venous 2.48 (HH) 0.5 - 1.9 mmol/L    Comment NOTIFIED PHYSICIAN   Urinalysis, Routine w reflex microscopic     Status: Abnormal   Collection Time: 04/12/18 10:17 PM  Result Value Ref Range   Color, Urine YELLOW YELLOW   APPearance CLEAR CLEAR   Specific Gravity, Urine 1.008 1.005 - 1.030   pH 7.0 5.0 - 8.0   Glucose, UA 50 (A) NEGATIVE mg/dL   Hgb urine dipstick NEGATIVE NEGATIVE   Bilirubin Urine NEGATIVE NEGATIVE   Ketones, ur 5 (A) NEGATIVE mg/dL   Protein, ur NEGATIVE NEGATIVE mg/dL   Nitrite NEGATIVE NEGATIVE   Leukocytes, UA NEGATIVE NEGATIVE    Comment: Performed at [facility], [place] [address], [place], [place] [address]  Blood Culture (routine x 2)     Status: None (Preliminary result)   Collection Time: 04/12/18 10:17 PM  Result Value Ref Range   Specimen Description      BLOOD LEFT ANTECUBITAL Performed at [facility] [address], [place], [place] [address]    Special Requests      BOTTLES DRAWN AEROBIC AND ANAEROBIC Blood Culture adequate volume Performed at [facility] [address] [place], [place] [address]    Culture  Setup Time      GRAM NEGATIVE RODS IN BOTH AEROBIC AND ANAEROBIC  BOTTLES Organism ID to follow CRITICAL VALUE NOTED.  VALUE IS CONSISTENT WITH PREVIOUSLY REPORTED AND CALLED VALUE. Performed at [facility], [place] [address], [place], [place] [address]    Culture GRAM NEGATIVE RODS    Report Status PENDING   I-Stat CG4 Lactic Acid, ED  (not at  [facility])     Status: Abnormal   Collection Time: 04/12/18 11:59 PM  Result Value Ref Range   Lactic Acid, Venous 3.14 (HH) 0.5 - 1.9 mmol/L   Comment NOTIFIED PHYSICIAN   CBC     Status: Abnormal   Collection Time: 04/13/18  3:07 AM  Result Value Ref Range   WBC 15.6 (H) 4.0 - 10.5 K/uL   RBC 3.93 3.87 - 5.11 MIL/uL   Hemoglobin 11.5 (L) 12.0 - 15.0 g/dL   HCT 33.7 (L) 36.0 - 46.0 %   MCV 85.8 78.0 - 100.0 fL   MCH 29.3 26.0 - 34.0 pg   MCHC 34.1 30.0 - 36.0 g/dL    RDW 15.2 11.5 - 15.5 %   Platelets 318 150 - 400 K/uL    Comment: Performed at [facility], [place] [address], [place], [place] [address]  Renal function panel     Status: Abnormal   Collection Time: 04/13/18  3:24 AM  Result Value Ref Range   Sodium 129 (L) 135 - 145 mmol/L   Potassium 2.6 (LL) 3.5 - 5.1 mmol/L    Comment: DELTA CHECK NOTED REPEATED TO VERIFY CRITICAL RESULT CALLED TO, READ BACK BY AND VERIFIED WITH: HEAVNER,A. RN _0  ON 08.19.19 BY COHEN,K    Chloride 97 (L) 98 - 111 mmol/L   CO2 22 22 - 32 mmol/L   Glucose, Bld 134 (H) 70 - 99 mg/dL   BUN 7 (L) 8 - 23 mg/dL   Creatinine, Ser <0.30 (L) 0.44 - 1.00 mg/dL   Calcium 7.8 (L) 8.9 - 10.3 mg/dL   Phosphorus 1.9 (L) 2.5 - 4.6 mg/dL   Albumin 2.7 (L) 3.5 - 5.0 g/dL   GFR calc non Af Amer NOT CALCULATED >60 mL/min   GFR calc Af Amer NOT CALCULATED >60 mL/min    Comment: (NOTE) The eGFR has been calculated using the CKD EPI equation. This calculation has not been validated in all clinical situations. eGFR's persistently <60 mL/min signify possible Chronic Kidney Disease.    Anion gap 10 5 - 15    Comment: Performed at [facility], [place] [address] [place], [place] [address]  Lactic acid, plasma     Status: Abnormal   Collection Time: 04/13/18  3:40 AM  Result Value Ref Range   Lactic Acid, Venous 2.0 (HH) 0.5 - 1.9 mmol/L    Comment: CRITICAL RESULT CALLED TO, READ BACK BY AND VERIFIED WITH: HEAVNER,A. RN _1  ON 08.19.19 BY COHEN,K Performed at [facility], [place] [address], [place], [place] [address]   MRSA PCR Screening     Status: None   Collection Time: 04/13/18  4:43 AM  Result Value Ref Range   MRSA by PCR NEGATIVE NEGATIVE    Comment:        The GeneXpert MRSA Assay (FDA approved for NASAL specimens only), is one component of a comprehensive MRSA colonization surveillance program. It is not intended to diagnose MRSA infection nor to guide  or monitor treatment for MRSA infections. Performed at [facility], [place] [address], [place], [place] [address]   Osmolality, urine     Status: Abnormal  Collection Time: 04/13/18  6:24 AM  Result Value Ref Range   Osmolality, Ur 257 (L) 300 - 900 mOsm/kg    Comment: Performed at [place] [address], [place], [place] [address]  Sodium, urine, random     Status: None   Collection Time: 04/13/18  6:24 AM  Result Value Ref Range   Sodium, Ur 68 mmol/L    Comment: Performed at [facility], [place] [address], [place], [place] [address]  Chloride, urine, random     Status: None   Collection Time: 04/13/18  6:24 AM  Result Value Ref Range   Chloride Urine 81 mmol/L    Comment: Performed at [facility], [place] [address], [place], [place] [address]  Osmolality     Status: Abnormal   Collection Time: 04/13/18  6:31 AM  Result Value Ref Range   Osmolality 267 (L) 275 - 295 mOsm/kg    Comment: Performed at [place] [address], [place], [place] [address]  Renal function panel     Status: Abnormal   Collection Time: 04/13/18  6:31 AM  Result Value Ref Range   Sodium 131 (L) 135 - 145 mmol/L   Potassium 2.6 (LL) 3.5 - 5.1 mmol/L    Comment: REPEATED TO VERIFY CRITICAL RESULT CALLED TO, READ BACK BY AND VERIFIED WITH: FAULK,M. RN AT 7412 04/13/18 MULLINS,T    Chloride 97 (L) 98 - 111 mmol/L   CO2 21 (L) 22 - 32 mmol/L   Glucose, Bld 116 (H) 70 - 99 mg/dL   BUN 7 (L) 8 - 23 mg/dL   Creatinine, Ser 0.33 (L) 0.44 - 1.00 mg/dL   Calcium 7.4 (L) 8.9 - 10.3 mg/dL   Phosphorus 2.6 2.5 - 4.6 mg/dL   Albumin 2.4 (L) 3.5 - 5.0 g/dL   GFR calc non Af Amer >60 >60 mL/min   GFR calc Af Amer >60 >60 mL/min    Comment: (NOTE) The eGFR has been calculated using the CKD EPI equation. This calculation has not been validated in all clinical situations. eGFR's persistently <60 mL/min signify possible Chronic  Kidney Disease.    Anion gap 13 5 - 15    Comment: Performed at [facility], [place] [address], [place], [place] [address]  Magnesium     Status: Abnormal   Collection Time: 04/13/18  6:31 AM  Result Value Ref Range   Magnesium 1.4 (L) 1.7 - 2.4 mg/dL    Comment: Performed at [facility], [place] [address], [place], [place] [address]  Lactic acid, plasma     Status: None   Collection Time: 04/13/18  6:31 AM  Result Value Ref Range   Lactic Acid, Venous 1.4 0.5 - 1.9 mmol/L    Comment: Performed at [facility], [place] [address] [place], [place] [address]  Phosphorus     Status: Abnormal   Collection Time: 04/13/18  6:40 AM  Result Value Ref Range   Phosphorus 1.8 (L) 2.5 - 4.6 mg/dL    Comment: Performed at [facility], [place] [address], [place], [place] [address]    Ct Head Wo Contrast  Result Date: 04/12/2018 CLINICAL DATA:  History of liver cancer and small cell lung cancer. Last chemotherapy 2 weeks ago. Abdominal pain and altered mental status. EXAM: CT HEAD WITHOUT CONTRAST TECHNIQUE: Contiguous axial images were obtained from the base of the skull through the vertex without intravenous contrast. COMPARISON:  02/24/2018 FINDINGS:  Brain: No mass effect or midline shift. No abnormal extra-axial fluid collections. No ventricular dilatation. There is vague low-attenuation change in the left anterior frontal white matter. This is similar to previous study and likely represents an area of small vessel ischemic change. If there is high clinical suspicion for intracranial metastasis, then contrast-enhanced MR would be the more sensitive examination. Gray-white matter junctions are distinct. Basal cisterns are not effaced. No acute intracranial hemorrhage. Vascular: Mild intracranial arterial calcifications are present. Skull: Calvarium appears intact. No acute depressed skull fractures or destructive bone lesions identified.  Sinuses/Orbits: There is an air-fluid level in the left sphenoid sinus, similar previous study. This is likely inflammatory. Paranasal sinuses and mastoid air cells are otherwise clear. Other: None. IMPRESSION: No acute intracranial abnormalities. Focal low-attenuation change in the left anterior frontal white matter is similar to previous study and likely represents an area of small vessel ischemic change. If there is high clinical suspicion for intracranial metastasis, then contrast-enhanced MR would be the more sensitive examination. Electronically Signed   By: Lucienne Capers M.D.   On: 04/12/2018 23:24   Ct Abdomen Pelvis W Contrast  Result Date: 04/12/2018 CLINICAL DATA:  Liver cancer abdominal pain EXAM: CT ABDOMEN AND PELVIS WITH CONTRAST TECHNIQUE: Multidetector CT imaging of the abdomen and pelvis was performed using the standard protocol following bolus administration of intravenous contrast. CONTRAST:  98m ISOVUE-300 IOPAMIDOL (ISOVUE-300) INJECTION 61% COMPARISON:  MRI 02/25/2018, PET-CT 12/26/2017, CT 05/23/2017 FINDINGS: Lower chest: Lung bases demonstrate tiny pleural effusions. Passive atelectasis in the posterior left lung base. Persistent collapse in the right lower lobe with occlusion of the bronchus as before. Heart size within normal limits. Coronary vascular calcification. Hepatobiliary: Increased intra hepatic biliary dilatation since prior study. Increased size of hypodense metastatic liver lesions. Index posterior right hepatic lobe lesion now measures 23 mm compared with 10 mm previously. A left lobe lesion measures 16 mm, compared with 9 mm previously. Decreased pneumobilia with only minimal foci of pneumobilia evident. No air visible within the biliary stent except at its duodenal termination. Minimal wall thickening of the gallbladder. No calcified stones. Pancreas: No inflammatory changes.  No measurable mass. Spleen: Normal in size without focal abnormality. Adrenals/Urinary  Tract: Right adrenal gland is within normal limits. Subtle increased nodularity of the left adrenal gland no hydronephrosis. The bladder is distended Stomach/Bowel: The stomach is nonenlarged. No dilated small bowel. No colon wall thickening. Vascular/Lymphatic: Moderate to marked aortic atherosclerosis. No aneurysmal dilatation. No definitive adenopathy. Reproductive: Uterus and bilateral adnexa are unremarkable. Other: Negative for free air. Tiny amount of free fluid in the pelvis. Musculoskeletal: Extensive skeletal metastatic disease. Vertically oriented fracture through L2 with retropulsion and moderate canal stenosis as before. IMPRESSION: 1. Biliary stent in place. Overall decreased pneumobilia since prior CT with minimal foci of gas present. Interval increase in intra hepatic biliary dilatation; constellation of findings is concerning for stent occlusion. 2. Increased size of hepatic metastatic lesions consistent with disease progression. 3. Right infrahilar soft tissue density/presumed mass and collapse of the right lower lobe as before. Trace pleural effusion 4. Subtle increased nodularity of the left adrenal gland is also concerning for metastatic disease. 5. Widespread skeletal metastatic disease with pathologic L2 fracture, retropulsion, and canal stenosis as noted on the prior exams. Electronically Signed   By: KDonavan FoilM.D.   On: 04/12/2018 23:40   Dg Chest Port 1 View  Result Date: 04/12/2018 CLINICAL DATA:  Fever and cough EXAM: PORTABLE CHEST 1 VIEW COMPARISON:  03/01/2018,  12/19/2017 FINDINGS: Right-sided central venous port tip overlies the cavoatrial region. No acute airspace disease or effusion. Stable cardiomediastinal silhouette. No pneumothorax. Minimal atelectasis or scar at the left base. IMPRESSION: No active disease. Electronically Signed   By: Donavan Foil M.D.   On: 04/12/2018 21:53   ROS:             Blood pressure (!) 75/39, pulse 70, temperature 98.2 F  (36.8 C), temperature source Oral, resp. rate 18, height _0  (1.6 m), weight 56.2 kg, SpO2 93 %.  Physical exam:   General--frail-appearing white female has lost all of her hair. ENT--slightly icteric Neck--no lymphadenopathy Heart--regular rate and rhythm without murmurs or gallops Lungs--symmetrical breath sounds grossly normal Abdomen--bowel sounds positive minimal tenderness Psych--alert and oriented answers questions appropriately   Assessment: 1.  Cholangitis.  Cultures positive for Klebsiella pneumonia and CT scan suggest occluded biliary stent.  Patient responding to antibiotics.  Unfortunately, it appears that her metastatic small cell cancer may be progressing.  Blood cultures positive for Klebsiella pneumonia 2.  Small cell lung cancer diffusely metastatic. 3.  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  Patient has uncovered stent placed at [facility] that may well be occluded  Plan: We will proceed with ERCP tomorrow at 130 with either replacement of biliary stent or clearing out the stent if possible.  Dr Therisa Doyne is aware of the patient and will perform the procedure tomorrow at 130.  Have discussed this in detail with the patient and her son who is with her today.  Orders are written.  She notes that she feels much better since receiving the antibiotics.   Nancy Fetter 04/13/2018, 4:47 PM   This note was created using voice recognition software and minor errors may Have occurred unintentionally. Pager: [phone] If no answer or after hours call [phone]

## 2018-04-13 NOTE — Progress Notes (Addendum)
CRITICAL VALUE ALERT  Critical Value:  K 2.6, LA 2, Na 129  Date & Time Notied:  04/13/18 0435  Provider Notified: Mercie Eon., NP  Orders Received/Actions taken: Awaiting orders  Update:   Provider Notified: Warren Lacy, MD  Orders Received/Actions taken: Orders for K replacement received. Elink made aware that RN has placed order for IV team to access [place].

## 2018-04-13 NOTE — ED Notes (Signed)
Critical care provider at bedside for assessment.

## 2018-04-13 NOTE — Consult Note (Signed)
[facility] CONSULT Reason for consult: Obstructed biliary stent Referring Physician: [facility] hospitalist.  PCP: Dr. Buelah Manis.  Oncologist: Dr. Irene Limbo.  [name] is an 67 y.o. female.  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  She has had mets to the liver, bone, adrenal glands and pancreas that have been confirmed by FNA.  There was a question of multiple brain mets as well.  She is completed 4 cycles of chemotherapy and repeat PET scan has shown some improvement.  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  She had a ERCP, sphincterotomy and attempted placement of biliary stent by Dr. Watt Climes a few months ago.  Unfortunately this was unsuccessful and she was referred to [facility] where she underwent stent placement.  I was able to review the records and a summary from [facility] and she apparently had a wall flex uncovered stent placed size 808.  This is done fairly well for a while.  She is continued to follow-up with Dr. Irene Limbo for continued chemotherapy.  Recent MRCP showed interval improvement from prior MRI exams with the pancreatic head mass improved and near complete resolution of the hepatic metastatic disease.  At the time that this procedure was done about a month ago she had stable intra-and extrahepatic ductal dilatation. She was doing well earlier yesterday and then began to have some pain and to get weaker.  She was brought to the ED via EMS and at the time was barely responsive noncommunicative.  Her temperature was 103.6 respiration rate 16.  She was discussed with me last night and the question was whether to transfer her to [facility] or continue to treat her here in [place].  Due to her instability we felt it was best to keep her here.  She was admitted to critical care and started on broad-spectrum antibiotics.  Labs reveal bilirubin of 3.6 WBC 15.6.  CT scan showed increase ductal dilatation as well as increase  hepatic metastasis.  This was felt to be consistent with an occluded stent.Patient was started on antibiotics and her blood cultures returned showing Klebsiella pneumonia 4 of 4 bottles.  Her antibiotic therapy has been narrowed to Rocephin.  Past Medical History:  Diagnosis Date  . Allergy   . Back pain   . Osteopenia   . Small cell lung cancer ([place]) 10/17/2017   Extended stage with metastases to liver and bone  . Tubular adenoma of colon 10/26/2014    Past Surgical History:  Procedure Laterality Date  . COLONOSCOPY    . COLONOSCOPY WITH PROPOFOL N/A 10/25/2014   Procedure: COLONOSCOPY WITH PROPOFOL;  Surgeon: Garlan Fair, MD;  Location: [facility];  Service: Endoscopy;  Laterality: N/A;  . ERCP N/A 10/15/2017   Procedure: ENDOSCOPIC RETROGRADE CHOLANGIOPANCREATOGRAPHY (ERCP);  Surgeon: Clarene Essex, MD;  Location: [facility];  Service: Endoscopy;  Laterality: N/A;  . IR FLUORO GUIDE PORT INSERTION RIGHT  11/10/2017  . IR RADIOLOGIST EVAL & MGMT  10/02/2017  . IR US GUIDE VASC ACCESS RIGHT  11/10/2017    Family History  Problem Relation Age of Onset  . Diabetes Mother   . Hypertension Mother   . Diabetes Sister   . Hypertension Sister   . Hyperlipidemia Sister     Social History:  reports that she quit smoking about 6 months ago. Her smoking use included cigarettes. She has a 40.00 pack-year smoking history. She has never used smokeless tobacco. She reports that she drinks alcohol. She reports  that she does not use drugs.  Allergies: No Known Allergies  Medications; Prior to Admission medications   Medication Sig Start Date End Date Taking? Authorizing Provider  calcium carbonate (OS-CAL - DOSED IN MG OF ELEMENTAL CALCIUM) 1250 (500 Ca) MG tablet Take 1 tablet by mouth daily.    Yes [provider]  hydrocortisone (CORTEF) 10 MG tablet Take 2 tablets (20 mg total) by mouth daily with breakfast. And 63m po daily with lunch. Patient taking differently: Take 10-20  mg by mouth See admin instructions. 20 mg every morning And 151mwith lunch. 03/23/18  Yes KaBrunetta GeneraMD  lidocaine-prilocaine (EMLA) cream Apply to affected area once 10/22/17  Yes KaBrunetta GeneraMD  LORazepam (ATIVAN) 0.5 MG tablet Take 1 tablet (0.5 mg total) by mouth every 6 (six) hours as needed (Nausea or vomiting). 10/22/17  Yes KaBrunetta GeneraMD  magic mouthwash w/lidocaine SOLN Take 10 mLs by mouth 4 (four) times daily as needed for mouth pain. Rinse and spit or swallow 12/08/17  Yes Tanner, VaLyndon Code PA-C  metoCLOPramide (REGLAN) 10 MG tablet Take 1 tablet (10 mg total) by mouth every 8 (eight) hours as needed for nausea. 03/16/18  Yes Tanner, VaLyndon Code PA-C  Multiple Vitamins-Minerals (MULTIVITAMIN WITH MINERALS) tablet Take 1 tablet by mouth daily.   Yes [provider]  ondansetron (ZOFRAN) 4 MG tablet Take 1 tablet (4 mg total) by mouth every 8 (eight) hours as needed for nausea or vomiting. 10/18/17  Yes KrBonnielee HaffMD  oxyCODONE (OXY IR/ROXICODONE) 5 MG immediate release tablet Take 1-2 tablets (5-10 mg total) by mouth every 4 (four) hours as needed for moderate pain or severe pain. 03/23/18  Yes KaBrunetta GeneraMD  polyethylene glycol (MSierra Nevada Memorial Hospitalpacket Take 17 g by mouth daily as needed for moderate constipation. 10/18/17  Yes KrBonnielee HaffMD  prochlorperazine (COMPAZINE) 10 MG tablet Take 1 tablet (10 mg total) by mouth every 6 (six) hours as needed (Nausea or vomiting). 10/22/17  Yes KaBrunetta GeneraMD  senna-docusate (SENNA S) 8.6-50 MG tablet Take 2 tablets by mouth at bedtime. Patient taking differently: Take 2 tablets by mouth at bedtime as needed for mild constipation or moderate constipation.  09/30/17  Yes KaBrunetta GeneraMD  sucralfate (CARAFATE) 1 GM/10ML suspension Take 10 mLs (1 g total) by mouth 3 (three) times daily. With meals Patient not taking: Reported on 04/13/2018 04/06/18   DuAlycia RossettiMD   . Chlorhexidine  Gluconate Cloth  6 each Topical Daily  . feeding supplement  1 Container Oral TID BM  . heparin  5,000 Units Subcutaneous Q8H  . hydrocortisone sod succinate (SOLU-CORTEF) inj  50 mg Intravenous Q6H  . mouth rinse  15 mL Mouth Rinse BID   PRN Meds sodium chloride, sodium chloride, morphine injection, ondansetron, sodium chloride flush Results for orders placed or performed during the hospital encounter of 04/12/18 (from the past 48 hour(s))  Blood Culture (routine x 2)     Status: None (Preliminary result)   Collection Time: 04/12/18  9:27 PM  Result Value Ref Range   Specimen Description      BLOOD LEFT ANTECUBITAL Performed at [facility] [place] [id number]  Special Requests      BOTTLES DRAWN AEROBIC AND ANAEROBIC Blood Culture adequate volume Performed at [facility] [place] [id number]  Culture  Setup Time  GRAM NEGATIVE RODS IN BOTH AEROBIC AND ANAEROBIC BOTTLES Organism ID to follow CRITICAL RESULT CALLED TO, READ BACK BY AND VERIFIED WITH: Guadlupe Spanish PharmD 11:35 04/13/18 ([email or web address]) Performed at [facility], [place] [address], [place], [place] [address]    Culture GRAM NEGATIVE RODS    Report Status PENDING   Blood Culture ID Panel (Reflexed)     Status: Abnormal   Collection Time: 04/12/18  9:27 PM  Result Value Ref Range   Enterococcus species NOT DETECTED NOT DETECTED   Listeria monocytogenes NOT DETECTED NOT DETECTED   Staphylococcus species NOT DETECTED NOT DETECTED   Staphylococcus aureus NOT DETECTED NOT DETECTED   Streptococcus species NOT DETECTED NOT DETECTED   Streptococcus agalactiae NOT DETECTED NOT DETECTED   Streptococcus pneumoniae NOT DETECTED NOT DETECTED   Streptococcus pyogenes NOT DETECTED NOT DETECTED   Acinetobacter baumannii NOT DETECTED NOT DETECTED   Enterobacteriaceae species DETECTED (A) NOT DETECTED    Comment: Enterobacteriaceae represent  a large family of gram-negative bacteria, not a single organism. CRITICAL RESULT CALLED TO, READ BACK BY AND VERIFIED WITH: Guadlupe Spanish PharmD 11:35 04/13/18 ([email or web address])    Enterobacter cloacae complex NOT DETECTED NOT DETECTED   Escherichia coli NOT DETECTED NOT DETECTED   Klebsiella oxytoca NOT DETECTED NOT DETECTED   Klebsiella pneumoniae DETECTED (A) NOT DETECTED    Comment: CRITICAL RESULT CALLED TO, READ BACK BY AND VERIFIED WITH: Guadlupe Spanish PharmD 11:35 04/13/18 ([email or web address])    Proteus species NOT DETECTED NOT DETECTED   Serratia marcescens NOT DETECTED NOT DETECTED   Carbapenem resistance NOT DETECTED NOT DETECTED   Haemophilus influenzae NOT DETECTED NOT DETECTED   Neisseria meningitidis NOT DETECTED NOT DETECTED   Pseudomonas aeruginosa NOT DETECTED NOT DETECTED   Candida albicans NOT DETECTED NOT DETECTED   Candida glabrata NOT DETECTED NOT DETECTED   Candida krusei NOT DETECTED NOT DETECTED   Candida parapsilosis NOT DETECTED NOT DETECTED   Candida tropicalis NOT DETECTED NOT DETECTED    Comment: Performed at [place] [address], [place], [place] [address]  POC occult blood, ED Provider will collect     Status: None   Collection Time: 04/12/18  9:35 PM  Result Value Ref Range   Fecal Occult Bld NEGATIVE NEGATIVE  Cortisol     Status: None   Collection Time: 04/12/18  9:50 PM  Result Value Ref Range   Cortisol, Plasma 41.8 ug/dL    Comment: (NOTE) AM    6.7 - 22.6 ug/dL PM   <10.0       ug/dL Performed at [facility] [address] [place], [place] [address]   Prealbumin     Status: Abnormal   Collection Time: 04/12/18  9:50 PM  Result Value Ref Range   Prealbumin 11.4 (L) 18 - 38 mg/dL    Comment: Performed at [facility] [address], [place], [place] [address]  Lipase, blood     Status: None   Collection Time: 04/12/18  9:51 PM  Result Value Ref Range   Lipase 26 11 - 51 U/L    Comment: Performed at [facility], [place] [address] [place], [place] [address]  Comprehensive metabolic panel     Status: Abnormal   Collection Time: 04/12/18  9:51 PM  Result Value Ref Range   Sodium 124 (L) 135 - 145 mmol/L   Potassium 3.8 3.5 - 5.1 mmol/L   Chloride 91 (L) 98 - 111 mmol/L   CO2  22 22 - 32 mmol/L   Glucose, Bld 199 (H) 70 - 99 mg/dL   BUN 9 8 - 23 mg/dL   Creatinine, Ser 0.40 (L) 0.44 - 1.00 mg/dL   Calcium 8.3 (L) 8.9 - 10.3 mg/dL   Total Protein 7.0 6.5 - 8.1 g/dL   Albumin 3.1 (L) 3.5 - 5.0 g/dL   AST 282 (H) 15 - 41 U/L   ALT 204 (H) 0 - 44 U/L   Alkaline Phosphatase 519 (H) 38 - 126 U/L   Total Bilirubin 3.6 (H) 0.3 - 1.2 mg/dL   GFR calc non Af Amer >60 >60 mL/min   GFR calc Af Amer >60 >60 mL/min    Comment: (NOTE) The eGFR has been calculated using the CKD EPI equation. This calculation has not been validated in all clinical situations. eGFR's persistently <60 mL/min signify possible Chronic Kidney Disease.    Anion gap 11 5 - 15    Comment: Performed at [facility], [place] [address], [place], [place] [address]  CBC     Status: Abnormal   Collection Time: 04/12/18  9:51 PM  Result Value Ref Range   WBC 13.8 (H) 4.0 - 10.5 K/uL   RBC 4.25 3.87 - 5.11 MIL/uL   Hemoglobin 12.6 12.0 - 15.0 g/dL   HCT 36.7 36.0 - 46.0 %   MCV 86.4 78.0 - 100.0 fL   MCH 29.6 26.0 - 34.0 pg   MCHC 34.3 30.0 - 36.0 g/dL   RDW 15.3 11.5 - 15.5 %   Platelets 436 (H) 150 - 400 K/uL    Comment: Performed at [facility], [place] [address] [place], [place] [address]  Ammonia     Status: Abnormal   Collection Time: 04/12/18 10:00 PM  Result Value Ref Range   Ammonia 58 (H) 9 - 35 umol/L    Comment: Performed at [facility], [place] [address], [place], [place] [address]  I-Stat CG4 Lactic Acid, ED  (not at  [facility])     Status: Abnormal   Collection Time: 04/12/18 10:03 PM  Result Value Ref Range   Lactic Acid, Venous 2.48 (HH) 0.5 - 1.9 mmol/L    Comment NOTIFIED PHYSICIAN   Urinalysis, Routine w reflex microscopic     Status: Abnormal   Collection Time: 04/12/18 10:17 PM  Result Value Ref Range   Color, Urine YELLOW YELLOW   APPearance CLEAR CLEAR   Specific Gravity, Urine 1.008 1.005 - 1.030   pH 7.0 5.0 - 8.0   Glucose, UA 50 (A) NEGATIVE mg/dL   Hgb urine dipstick NEGATIVE NEGATIVE   Bilirubin Urine NEGATIVE NEGATIVE   Ketones, ur 5 (A) NEGATIVE mg/dL   Protein, ur NEGATIVE NEGATIVE mg/dL   Nitrite NEGATIVE NEGATIVE   Leukocytes, UA NEGATIVE NEGATIVE    Comment: Performed at [facility], [place] [address], [place], [place] [address]  Blood Culture (routine x 2)     Status: None (Preliminary result)   Collection Time: 04/12/18 10:17 PM  Result Value Ref Range   Specimen Description      BLOOD LEFT ANTECUBITAL Performed at [facility] [address], [place], [place] [address]    Special Requests      BOTTLES DRAWN AEROBIC AND ANAEROBIC Blood Culture adequate volume Performed at [facility] [address] [place], [place] [address]    Culture  Setup Time      GRAM NEGATIVE RODS IN BOTH AEROBIC AND ANAEROBIC  BOTTLES Organism ID to follow CRITICAL VALUE NOTED.  VALUE IS CONSISTENT WITH PREVIOUSLY REPORTED AND CALLED VALUE. Performed at [facility], [place] [address], [place], [place] [address]    Culture GRAM NEGATIVE RODS    Report Status PENDING   I-Stat CG4 Lactic Acid, ED  (not at  [facility])     Status: Abnormal   Collection Time: 04/12/18 11:59 PM  Result Value Ref Range   Lactic Acid, Venous 3.14 (HH) 0.5 - 1.9 mmol/L   Comment NOTIFIED PHYSICIAN   CBC     Status: Abnormal   Collection Time: 04/13/18  3:07 AM  Result Value Ref Range   WBC 15.6 (H) 4.0 - 10.5 K/uL   RBC 3.93 3.87 - 5.11 MIL/uL   Hemoglobin 11.5 (L) 12.0 - 15.0 g/dL   HCT 33.7 (L) 36.0 - 46.0 %   MCV 85.8 78.0 - 100.0 fL   MCH 29.3 26.0 - 34.0 pg   MCHC 34.1 30.0 - 36.0 g/dL    RDW 15.2 11.5 - 15.5 %   Platelets 318 150 - 400 K/uL    Comment: Performed at [facility], [place] [address], [place], [place] [address]  Renal function panel     Status: Abnormal   Collection Time: 04/13/18  3:24 AM  Result Value Ref Range   Sodium 129 (L) 135 - 145 mmol/L   Potassium 2.6 (LL) 3.5 - 5.1 mmol/L    Comment: DELTA CHECK NOTED REPEATED TO VERIFY CRITICAL RESULT CALLED TO, READ BACK BY AND VERIFIED WITH: HEAVNER,A. RN _0  ON 08.19.19 BY COHEN,K    Chloride 97 (L) 98 - 111 mmol/L   CO2 22 22 - 32 mmol/L   Glucose, Bld 134 (H) 70 - 99 mg/dL   BUN 7 (L) 8 - 23 mg/dL   Creatinine, Ser <0.30 (L) 0.44 - 1.00 mg/dL   Calcium 7.8 (L) 8.9 - 10.3 mg/dL   Phosphorus 1.9 (L) 2.5 - 4.6 mg/dL   Albumin 2.7 (L) 3.5 - 5.0 g/dL   GFR calc non Af Amer NOT CALCULATED >60 mL/min   GFR calc Af Amer NOT CALCULATED >60 mL/min    Comment: (NOTE) The eGFR has been calculated using the CKD EPI equation. This calculation has not been validated in all clinical situations. eGFR's persistently <60 mL/min signify possible Chronic Kidney Disease.    Anion gap 10 5 - 15    Comment: Performed at [facility], [place] [address] [place], [place] [address]  Lactic acid, plasma     Status: Abnormal   Collection Time: 04/13/18  3:40 AM  Result Value Ref Range   Lactic Acid, Venous 2.0 (HH) 0.5 - 1.9 mmol/L    Comment: CRITICAL RESULT CALLED TO, READ BACK BY AND VERIFIED WITH: HEAVNER,A. RN _1  ON 08.19.19 BY COHEN,K Performed at [facility], [place] [address], [place], [place] [address]   MRSA PCR Screening     Status: None   Collection Time: 04/13/18  4:43 AM  Result Value Ref Range   MRSA by PCR NEGATIVE NEGATIVE    Comment:        The GeneXpert MRSA Assay (FDA approved for NASAL specimens only), is one component of a comprehensive MRSA colonization surveillance program. It is not intended to diagnose MRSA infection nor to guide  or monitor treatment for MRSA infections. Performed at [facility], [place] [address], [place], [place] [address]   Osmolality, urine     Status: Abnormal  Collection Time: 04/13/18  6:24 AM  Result Value Ref Range   Osmolality, Ur 257 (L) 300 - 900 mOsm/kg    Comment: Performed at [place] [address], [place], [place] [address]  Sodium, urine, random     Status: None   Collection Time: 04/13/18  6:24 AM  Result Value Ref Range   Sodium, Ur 68 mmol/L    Comment: Performed at [facility], [place] [address], [place], [place] [address]  Chloride, urine, random     Status: None   Collection Time: 04/13/18  6:24 AM  Result Value Ref Range   Chloride Urine 81 mmol/L    Comment: Performed at [facility], [place] [address], [place], [place] [address]  Osmolality     Status: Abnormal   Collection Time: 04/13/18  6:31 AM  Result Value Ref Range   Osmolality 267 (L) 275 - 295 mOsm/kg    Comment: Performed at [place] [address], [place], [place] [address]  Renal function panel     Status: Abnormal   Collection Time: 04/13/18  6:31 AM  Result Value Ref Range   Sodium 131 (L) 135 - 145 mmol/L   Potassium 2.6 (LL) 3.5 - 5.1 mmol/L    Comment: REPEATED TO VERIFY CRITICAL RESULT CALLED TO, READ BACK BY AND VERIFIED WITH: FAULK,M. RN AT 7412 04/13/18 MULLINS,T    Chloride 97 (L) 98 - 111 mmol/L   CO2 21 (L) 22 - 32 mmol/L   Glucose, Bld 116 (H) 70 - 99 mg/dL   BUN 7 (L) 8 - 23 mg/dL   Creatinine, Ser 0.33 (L) 0.44 - 1.00 mg/dL   Calcium 7.4 (L) 8.9 - 10.3 mg/dL   Phosphorus 2.6 2.5 - 4.6 mg/dL   Albumin 2.4 (L) 3.5 - 5.0 g/dL   GFR calc non Af Amer >60 >60 mL/min   GFR calc Af Amer >60 >60 mL/min    Comment: (NOTE) The eGFR has been calculated using the CKD EPI equation. This calculation has not been validated in all clinical situations. eGFR's persistently <60 mL/min signify possible Chronic  Kidney Disease.    Anion gap 13 5 - 15    Comment: Performed at [facility], [place] [address], [place], [place] [address]  Magnesium     Status: Abnormal   Collection Time: 04/13/18  6:31 AM  Result Value Ref Range   Magnesium 1.4 (L) 1.7 - 2.4 mg/dL    Comment: Performed at [facility], [place] [address], [place], [place] [address]  Lactic acid, plasma     Status: None   Collection Time: 04/13/18  6:31 AM  Result Value Ref Range   Lactic Acid, Venous 1.4 0.5 - 1.9 mmol/L    Comment: Performed at [facility], [place] [address] [place], [place] [address]  Phosphorus     Status: Abnormal   Collection Time: 04/13/18  6:40 AM  Result Value Ref Range   Phosphorus 1.8 (L) 2.5 - 4.6 mg/dL    Comment: Performed at [facility], [place] [address], [place], [place] [address]    Ct Head Wo Contrast  Result Date: 04/12/2018 CLINICAL DATA:  History of liver cancer and small cell lung cancer. Last chemotherapy 2 weeks ago. Abdominal pain and altered mental status. EXAM: CT HEAD WITHOUT CONTRAST TECHNIQUE: Contiguous axial images were obtained from the base of the skull through the vertex without intravenous contrast. COMPARISON:  02/24/2018 FINDINGS:  Brain: No mass effect or midline shift. No abnormal extra-axial fluid collections. No ventricular dilatation. There is vague low-attenuation change in the left anterior frontal white matter. This is similar to previous study and likely represents an area of small vessel ischemic change. If there is high clinical suspicion for intracranial metastasis, then contrast-enhanced MR would be the more sensitive examination. Gray-white matter junctions are distinct. Basal cisterns are not effaced. No acute intracranial hemorrhage. Vascular: Mild intracranial arterial calcifications are present. Skull: Calvarium appears intact. No acute depressed skull fractures or destructive bone lesions identified.  Sinuses/Orbits: There is an air-fluid level in the left sphenoid sinus, similar previous study. This is likely inflammatory. Paranasal sinuses and mastoid air cells are otherwise clear. Other: None. IMPRESSION: No acute intracranial abnormalities. Focal low-attenuation change in the left anterior frontal white matter is similar to previous study and likely represents an area of small vessel ischemic change. If there is high clinical suspicion for intracranial metastasis, then contrast-enhanced MR would be the more sensitive examination. Electronically Signed   By: Lucienne Capers M.D.   On: 04/12/2018 23:24   Ct Abdomen Pelvis W Contrast  Result Date: 04/12/2018 CLINICAL DATA:  Liver cancer abdominal pain EXAM: CT ABDOMEN AND PELVIS WITH CONTRAST TECHNIQUE: Multidetector CT imaging of the abdomen and pelvis was performed using the standard protocol following bolus administration of intravenous contrast. CONTRAST:  98m ISOVUE-300 IOPAMIDOL (ISOVUE-300) INJECTION 61% COMPARISON:  MRI 02/25/2018, PET-CT 12/26/2017, CT 05/23/2017 FINDINGS: Lower chest: Lung bases demonstrate tiny pleural effusions. Passive atelectasis in the posterior left lung base. Persistent collapse in the right lower lobe with occlusion of the bronchus as before. Heart size within normal limits. Coronary vascular calcification. Hepatobiliary: Increased intra hepatic biliary dilatation since prior study. Increased size of hypodense metastatic liver lesions. Index posterior right hepatic lobe lesion now measures 23 mm compared with 10 mm previously. A left lobe lesion measures 16 mm, compared with 9 mm previously. Decreased pneumobilia with only minimal foci of pneumobilia evident. No air visible within the biliary stent except at its duodenal termination. Minimal wall thickening of the gallbladder. No calcified stones. Pancreas: No inflammatory changes.  No measurable mass. Spleen: Normal in size without focal abnormality. Adrenals/Urinary  Tract: Right adrenal gland is within normal limits. Subtle increased nodularity of the left adrenal gland no hydronephrosis. The bladder is distended Stomach/Bowel: The stomach is nonenlarged. No dilated small bowel. No colon wall thickening. Vascular/Lymphatic: Moderate to marked aortic atherosclerosis. No aneurysmal dilatation. No definitive adenopathy. Reproductive: Uterus and bilateral adnexa are unremarkable. Other: Negative for free air. Tiny amount of free fluid in the pelvis. Musculoskeletal: Extensive skeletal metastatic disease. Vertically oriented fracture through L2 with retropulsion and moderate canal stenosis as before. IMPRESSION: 1. Biliary stent in place. Overall decreased pneumobilia since prior CT with minimal foci of gas present. Interval increase in intra hepatic biliary dilatation; constellation of findings is concerning for stent occlusion. 2. Increased size of hepatic metastatic lesions consistent with disease progression. 3. Right infrahilar soft tissue density/presumed mass and collapse of the right lower lobe as before. Trace pleural effusion 4. Subtle increased nodularity of the left adrenal gland is also concerning for metastatic disease. 5. Widespread skeletal metastatic disease with pathologic L2 fracture, retropulsion, and canal stenosis as noted on the prior exams. Electronically Signed   By: KDonavan FoilM.D.   On: 04/12/2018 23:40   Dg Chest Port 1 View  Result Date: 04/12/2018 CLINICAL DATA:  Fever and cough EXAM: PORTABLE CHEST 1 VIEW COMPARISON:  03/01/2018,  12/19/2017 FINDINGS: Right-sided central venous port tip overlies the cavoatrial region. No acute airspace disease or effusion. Stable cardiomediastinal silhouette. No pneumothorax. Minimal atelectasis or scar at the left base. IMPRESSION: No active disease. Electronically Signed   By: Donavan Foil M.D.   On: 04/12/2018 21:53   ROS:             Blood pressure (!) 75/39, pulse 70, temperature 98.2 F  (36.8 C), temperature source Oral, resp. rate 18, height _0  (1.6 m), weight 56.2 kg, SpO2 93 %.  Physical exam:   General--frail-appearing white female has lost all of her hair. ENT--slightly icteric Neck--no lymphadenopathy Heart--regular rate and rhythm without murmurs or gallops Lungs--symmetrical breath sounds grossly normal Abdomen--bowel sounds positive minimal tenderness Psych--alert and oriented answers questions appropriately   Assessment: 1.  Cholangitis.  Cultures positive for Klebsiella pneumonia and CT scan suggest occluded biliary stent.  Patient responding to antibiotics.  Unfortunately, it appears that her metastatic small cell cancer may be progressing.  Blood cultures positive for Klebsiella pneumonia 2.  Small cell lung cancer diffusely metastatic. 3.  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  Patient has uncovered stent placed at [facility] that may well be occluded  Plan: We will proceed with ERCP tomorrow at 130 with either replacement of biliary stent or clearing out the stent if possible.  Dr Therisa Doyne is aware of the patient and will perform the procedure tomorrow at 130.  Have discussed this in detail with the patient and her son who is with her today.  Orders are written.  She notes that she feels much better since receiving the antibiotics.   Nancy Fetter 04/13/2018, 4:47 PM   This note was created using voice recognition software and minor errors may Have occurred unintentionally. Pager: [phone] If no answer or after hours call [phone]

## 2018-04-14 ENCOUNTER — Inpatient Hospital Stay (HOSPITAL_COMMUNITY): Payer: Medicare Other | Admitting: Anesthesiology

## 2018-04-14 ENCOUNTER — Inpatient Hospital Stay (HOSPITAL_COMMUNITY): Payer: Medicare Other

## 2018-04-14 ENCOUNTER — Encounter (HOSPITAL_COMMUNITY)
Admission: EM | Disposition: A | Discharge status: Home Health | Payer: Self-pay | Source: Home / Self Care | Attending: Family Medicine

## 2018-04-14 DIAGNOSIS — C787 Secondary malignant neoplasm of liver and intrahepatic bile duct: Secondary | ICD-10-CM

## 2018-04-14 DIAGNOSIS — K8309 Other cholangitis: Secondary | ICD-10-CM

## 2018-04-14 HISTORY — PX: ERCP: SHX5425

## 2018-04-14 HISTORY — PX: BILIARY STENT PLACEMENT: SHX5538

## 2018-04-14 LAB — COMPREHENSIVE METABOLIC PANEL
ALBUMIN: 2.5 g/dL — AB (ref 3.5–5.0)
ALK PHOS: 364 U/L — AB (ref 38–126)
ALT: 114 U/L — ABNORMAL HIGH (ref 0–44)
ANION GAP: 8 (ref 5–15)
AST: 77 U/L — ABNORMAL HIGH (ref 15–41)
BUN: 8 mg/dL (ref 8–23)
CALCIUM: 7 mg/dL — AB (ref 8.9–10.3)
CHLORIDE: 104 mmol/L (ref 98–111)
CO2: 24 mmol/L (ref 22–32)
Creatinine, Ser: <0.3 mg/dL — ABNORMAL LOW (ref 0.44–1.00)
GLUCOSE: 129 mg/dL — AB (ref 70–99)
Potassium: 3.3 mmol/L — ABNORMAL LOW (ref 3.5–5.1)
SODIUM: 136 mmol/L (ref 135–145)
Total Bilirubin: 1.2 mg/dL (ref 0.3–1.2)
Total Protein: 5.6 g/dL — ABNORMAL LOW (ref 6.5–8.1)

## 2018-04-14 LAB — CBC WITH DIFFERENTIAL/PLATELET
BASOS PCT: 0 %
Basophils Absolute: 0 10*3/uL (ref 0.0–0.1)
Eosinophils Absolute: 0 10*3/uL (ref 0.0–0.7)
Eosinophils Relative: 0 %
HEMATOCRIT: 29 % — AB (ref 36.0–46.0)
HEMOGLOBIN: 9.9 g/dL — AB (ref 12.0–15.0)
LYMPHS ABS: 0.7 10*3/uL (ref 0.7–4.0)
LYMPHS PCT: 4 %
MCH: 29.4 pg (ref 26.0–34.0)
MCHC: 34.1 g/dL (ref 30.0–36.0)
MCV: 86.1 fL (ref 78.0–100.0)
MONO ABS: 0.5 10*3/uL (ref 0.1–1.0)
MONOS PCT: 3 %
NEUTROS ABS: 14.9 10*3/uL — AB (ref 1.7–7.7)
Neutrophils Relative %: 93 %
Platelets: 324 10*3/uL (ref 150–400)
RBC: 3.37 MIL/uL — ABNORMAL LOW (ref 3.87–5.11)
RDW: 15.7 % — ABNORMAL HIGH (ref 11.5–15.5)
WBC: 16.2 10*3/uL — ABNORMAL HIGH (ref 4.0–10.5)

## 2018-04-14 LAB — PROTIME-INR
INR: 1.29
Prothrombin Time: 16 seconds — ABNORMAL HIGH (ref 11.4–15.2)

## 2018-04-14 LAB — URINE CULTURE: Culture: NO GROWTH

## 2018-04-14 SURGERY — ERCP, WITH INTERVENTION IF INDICATED
Anesthesia: General

## 2018-04-14 MED ORDER — ONDANSETRON HCL 4 MG PO TABS
4.0000 mg | ORAL_TABLET | Freq: Three times a day (TID) | ORAL | Status: DC | PRN
  Administered 2018-04-16 – 2018-04-18 (×3): 4 mg via ORAL
  Filled 2018-04-14 (×3): qty 1

## 2018-04-14 MED ORDER — PROCHLORPERAZINE MALEATE 10 MG PO TABS: 10.0000 mg | ORAL_TABLET | Freq: Four times a day (QID) | ORAL | Status: DC | PRN

## 2018-04-14 MED ORDER — ADULT MULTIVITAMIN W/MINERALS CH
1.0000 | ORAL_TABLET | Freq: Every day | ORAL | Status: DC
  Administered 2018-04-15 – 2018-04-17 (×2): 1 via ORAL
  Filled 2018-04-14 (×3): qty 1

## 2018-04-14 MED ORDER — METOCLOPRAMIDE HCL 10 MG PO TABS: 10.0000 mg | ORAL_TABLET | Freq: Three times a day (TID) | ORAL | Status: DC | PRN

## 2018-04-14 MED ORDER — PROPOFOL 10 MG/ML IV BOLUS
INTRAVENOUS | Status: DC | PRN
  Administered 2018-04-14: 100 mg via INTRAVENOUS

## 2018-04-14 MED ORDER — PROPOFOL 10 MG/ML IV BOLUS
INTRAVENOUS | Status: AC
  Filled 2018-04-14: qty 20

## 2018-04-14 MED ORDER — SUGAMMADEX SODIUM 200 MG/2ML IV SOLN
INTRAVENOUS | Status: DC | PRN
  Administered 2018-04-14: 100 mg via INTRAVENOUS

## 2018-04-14 MED ORDER — POTASSIUM CHLORIDE 10 MEQ/100ML IV SOLN
10.0000 meq | INTRAVENOUS | Status: AC
  Administered 2018-04-14 (×2): 10 meq via INTRAVENOUS
  Filled 2018-04-14 (×2): qty 100

## 2018-04-14 MED ORDER — LACTATED RINGERS IV SOLN
INTRAVENOUS | Status: DC
  Administered 2018-04-14 – 2018-04-15 (×2): via INTRAVENOUS

## 2018-04-14 MED ORDER — DEXTROSE IN LACTATED RINGERS 5 % IV SOLN
INTRAVENOUS | Status: DC
  Administered 2018-04-14 – 2018-04-15 (×3): via INTRAVENOUS

## 2018-04-14 MED ORDER — ONDANSETRON HCL 4 MG/2ML IJ SOLN
INTRAMUSCULAR | Status: DC | PRN
  Administered 2018-04-14: 4 mg via INTRAVENOUS

## 2018-04-14 MED ORDER — KETOROLAC TROMETHAMINE 15 MG/ML IJ SOLN
15.0000 mg | Freq: Four times a day (QID) | INTRAMUSCULAR | Status: DC | PRN
  Administered 2018-04-14 – 2018-04-17 (×4): 15 mg via INTRAVENOUS
  Filled 2018-04-14 (×5): qty 1

## 2018-04-14 MED ORDER — OXYCODONE HCL 5 MG PO TABS
5.0000 mg | ORAL_TABLET | ORAL | Status: DC | PRN
  Administered 2018-04-15: 5 mg via ORAL
  Administered 2018-04-16 – 2018-04-18 (×3): 10 mg via ORAL
  Filled 2018-04-14: qty 2
  Filled 2018-04-14: qty 1
  Filled 2018-04-14 (×2): qty 2

## 2018-04-14 MED ORDER — DIPHENHYDRAMINE HCL 25 MG PO CAPS
25.0000 mg | ORAL_CAPSULE | Freq: Every evening | ORAL | Status: DC | PRN
  Administered 2018-04-14: 25 mg via ORAL
  Filled 2018-04-14: qty 1

## 2018-04-14 MED ORDER — LIDOCAINE-PRILOCAINE 2.5-2.5 % EX CREA: TOPICAL_CREAM | Freq: Once | CUTANEOUS | Status: DC

## 2018-04-14 MED ORDER — INDOMETHACIN 50 MG RE SUPP
RECTAL | Status: AC
  Filled 2018-04-14: qty 2

## 2018-04-14 MED ORDER — FENTANYL CITRATE (PF) 100 MCG/2ML IJ SOLN: 25.0000 ug | INTRAMUSCULAR | Status: DC | PRN

## 2018-04-14 MED ORDER — LIDOCAINE HCL (CARDIAC) PF 100 MG/5ML IV SOSY
PREFILLED_SYRINGE | INTRAVENOUS | Status: DC | PRN
  Administered 2018-04-14: 50 mg via INTRAVENOUS

## 2018-04-14 MED ORDER — CALCIUM CARBONATE 1250 (500 CA) MG PO TABS
1.0000 | ORAL_TABLET | Freq: Every day | ORAL | Status: DC
  Administered 2018-04-15 – 2018-04-17 (×3): 500 mg via ORAL
  Filled 2018-04-14 (×6): qty 1

## 2018-04-14 MED ORDER — LORAZEPAM 0.5 MG PO TABS: 0.5000 mg | ORAL_TABLET | Freq: Four times a day (QID) | ORAL | Status: DC | PRN

## 2018-04-14 MED ORDER — HYDROCORTISONE 10 MG PO TABS: 10.0000 mg | ORAL_TABLET | ORAL | Status: DC

## 2018-04-14 MED ORDER — PHENYLEPHRINE HCL 10 MG/ML IJ SOLN
INTRAMUSCULAR | Status: DC | PRN
  Administered 2018-04-14 (×3): 80 ug via INTRAVENOUS

## 2018-04-14 MED ORDER — SODIUM CHLORIDE 0.9 % IV SOLN
INTRAVENOUS | Status: DC | PRN
  Administered 2018-04-14: 30 mL

## 2018-04-14 MED ORDER — GLUCAGON HCL RDNA (DIAGNOSTIC) 1 MG IJ SOLR
INTRAMUSCULAR | Status: AC
  Filled 2018-04-14: qty 1

## 2018-04-14 MED ORDER — FENTANYL CITRATE (PF) 100 MCG/2ML IJ SOLN
INTRAMUSCULAR | Status: AC
  Filled 2018-04-14: qty 2

## 2018-04-14 MED ORDER — FENTANYL CITRATE (PF) 100 MCG/2ML IJ SOLN
INTRAMUSCULAR | Status: DC | PRN
  Administered 2018-04-14: 50 ug via INTRAVENOUS

## 2018-04-14 MED ORDER — ZOLPIDEM TARTRATE 5 MG PO TABS: 5.0000 mg | ORAL_TABLET | Freq: Every evening | ORAL | Status: DC | PRN

## 2018-04-14 MED ORDER — SENNOSIDES-DOCUSATE SODIUM 8.6-50 MG PO TABS
2.0000 | ORAL_TABLET | Freq: Every evening | ORAL | Status: DC | PRN
  Filled 2018-04-14: qty 2

## 2018-04-14 MED ORDER — SODIUM CHLORIDE 0.9 % IV BOLUS
500.0000 mL | Freq: Once | INTRAVENOUS | Status: AC
  Administered 2018-04-14: 500 mL via INTRAVENOUS

## 2018-04-14 MED ORDER — MAGIC MOUTHWASH W/LIDOCAINE
10.0000 mL | Freq: Four times a day (QID) | ORAL | Status: DC | PRN
  Filled 2018-04-14: qty 10

## 2018-04-14 MED ORDER — POLYETHYLENE GLYCOL 3350 17 G PO PACK
17.0000 g | PACK | Freq: Every day | ORAL | Status: DC | PRN
  Administered 2018-04-16 – 2018-04-18 (×2): 17 g via ORAL
  Filled 2018-04-14 (×3): qty 1

## 2018-04-14 MED ORDER — ROCURONIUM BROMIDE 100 MG/10ML IV SOLN
INTRAVENOUS | Status: DC | PRN
  Administered 2018-04-14: 40 mg via INTRAVENOUS

## 2018-04-14 MED ORDER — DEXAMETHASONE SODIUM PHOSPHATE 10 MG/ML IJ SOLN
INTRAMUSCULAR | Status: DC | PRN
  Administered 2018-04-14: 10 mg via INTRAVENOUS

## 2018-04-14 NOTE — Transfer of Care (Signed)
Immediate Anesthesia Transfer of Care Note  Patient: Haley [name]  Procedure(s) Performed: ENDOSCOPIC RETROGRADE CHOLANGIOPANCREATOGRAPHY (ERCP) (N/A )  Patient Location: PACU  Anesthesia Type:General  Level of Consciousness: awake, alert  and oriented  Airway & Oxygen Therapy: Patient Spontanous Breathing and Patient connected to face mask oxygen  Post-op Assessment: Report given to RN and Post -op Vital signs reviewed and stable  Post vital signs: Reviewed and stable  Last Vitals:  Vitals Value Taken Time  BP 132/77 04/14/2018  2:48 PM  Temp    Pulse 84 04/14/2018  2:49 PM  Resp 17 04/14/2018  2:49 PM  SpO2 98 % 04/14/2018  2:49 PM  Vitals shown include unvalidated device data.  Last Pain:  Vitals:   04/14/18 1238  TempSrc: Oral  PainSc: 0-No pain      Patients Stated Pain Goal: 0 (50/27/74 1287)  Complications: No apparent anesthesia complications

## 2018-04-14 NOTE — Anesthesia Procedure Notes (Signed)
Procedure Name: Intubation Date/Time: 04/14/2018 1:38 PM Performed by: Glory Buff, CRNA Pre-anesthesia Checklist: Patient identified, Emergency Drugs available, Suction available and Patient being monitored Patient Re-evaluated:Patient Re-evaluated prior to induction Oxygen Delivery Method: Circle system utilized Preoxygenation: Pre-oxygenation with 100% oxygen Induction Type: IV induction Ventilation: Mask ventilation without difficulty Laryngoscope Size: Miller and 3 Grade View: Grade I Tube type: Oral Tube size: 7.0 mm Number of attempts: 1 Airway Equipment and Method: Stylet and Oral airway Placement Confirmation: ETT inserted through vocal cords under direct vision,  positive ETCO2 and breath sounds checked- equal and bilateral Secured at: 22 cm Tube secured with: Tape Dental Injury: Teeth and Oropharynx as per pre-operative assessment

## 2018-04-14 NOTE — Anesthesia Postprocedure Evaluation (Signed)
Anesthesia Post Note  Patient: Carolan Shiver  Procedure(s) Performed: ENDOSCOPIC RETROGRADE CHOLANGIOPANCREATOGRAPHY (ERCP) (N/A ) BILIARY STENT PLACEMENT (N/A ) BILIARY DILITATION (N/A )     Patient location during evaluation: PACU Anesthesia Type: General Level of consciousness: awake Pain management: pain level controlled Vital Signs Assessment: post-procedure vital signs reviewed and stable Respiratory status: spontaneous breathing Cardiovascular status: stable Postop Assessment: no apparent nausea or vomiting Anesthetic complications: no    Last Vitals:  Vitals:   04/14/18 1520 04/14/18 1608  BP: (!) 143/84   Pulse: 75   Resp: (!) 22   Temp:  36.6 C  SpO2: 97%     Last Pain:  Vitals:   04/14/18 1608  TempSrc: Oral  PainSc:                  Blayke Pinera

## 2018-04-14 NOTE — Brief Op Note (Signed)
04/12/2018 - 04/14/2018  2:51 PM  PATIENT:  Haley [name]  66 y.o. female  PRE-OPERATIVE DIAGNOSIS:  cholangitis  POST-OPERATIVE DIAGNOSIS:  * No post-op diagnosis entered *  PROCEDURE:  Procedure(s) with comments: ENDOSCOPIC RETROGRADE CHOLANGIOPANCREATOGRAPHY (ERCP) (N/A) - with stent change  SURGEON:  Surgeon(s) and Role:    [name], MD - Primary  PHYSICIAN ASSISTANT:   ASSISTANTS: Cleda Daub, RN, Hope Parker, Tech ANESTHESIA:   MAC  EBL:  0 mL   BLOOD ADMINISTERED:none  DRAINS: none   LOCAL MEDICATIONS USED:  NONE  SPECIMEN:  No Specimen  DISPOSITION OF SPECIMEN:  N/A  COUNTS:  YES  TOURNIQUET:  * No tourniquets in log *  DICTATION: .Dragon Dictation  PLAN OF CARE: Admit to inpatient   PATIENT DISPOSITION:  PACU - hemodynamically stable.   Delay start of Pharmacological VTE agent (>24hrs) due to surgical blood loss or risk of bleeding: no

## 2018-04-14 NOTE — Op Note (Signed)
[facility] Patient Name: Haley [name] Procedure Date: 04/14/2018 MRN: [id number] Attending MD: [name] , MD Date of Birth: 1951-03-26 CSN: [id number] Age: 67 Admit Type: Inpatient Procedure:                ERCP Indications:              For therapy of ascending cholangitis, metastatic                            small cell cancer, previously placed uncovered                            metal biliary stent in 3/19, suspected stent                            occlusion Providers:                [name], MD, Cleda Daub, RN, Cletis Athens,                            Technician, Rosario Adie, CRNA Referring MD:              Medicines:                Monitored Anesthesia Care Complications:            No immediate complications. Estimated Blood Loss:     Estimated blood loss: none. Procedure:                Pre-Anesthesia Assessment:                           - Prior to the procedure, a History and Physical                            was performed, and patient medications and                            allergies were reviewed. The patient's tolerance of                            previous anesthesia was also reviewed. The risks                            and benefits of the procedure and the sedation                            options and risks were discussed with the patient.                            All questions were answered, and informed consent                            was obtained. Prior Anticoagulants: The patient has                            taken no previous anticoagulant  or antiplatelet                            agents. ASA Grade Assessment: III - A patient with                            severe systemic disease. After reviewing the risks                            and benefits, the patient was deemed in                            satisfactory condition to undergo the procedure.                           After obtaining informed consent, the  scope was                            passed under direct vision. Throughout the                            procedure, the patient's blood pressure, pulse, and                            oxygen saturations were monitored continuously. The                            TJF-Q180V ([id number]) Olympus ERCP was introduced                            through the mouth, and used to inject contrast into                            and used to inject contrast into the bile duct. The                            ERCP was accomplished without difficulty. The                            patient tolerated the procedure well. Scope In: Scope Out: Findings:      The scout film was normal. The esophagus was successfully intubated       under direct vision. The scope was advanced to a normal major papilla in       the descending duodenum without detailed examination of the pharynx,       larynx and associated structures, and upper GI tract. The upper GI tract       was grossly normal. The bile duct was deeply cannulated with the       sphincterotome. Contrast was injected. I personally interpreted the bile       duct images. There was brisk flow of contrast through the ducts. Image       quality was excellent. Contrast extended to the entire biliary tree. A       metal stent was noted at the ampulla. The lower third of  the main bile       duct contained a single localized stenosis 10 mm in length due to tumor       ingrowth. The lower third of the main bile duct was locally narrowed,       secondary to a stricture with the inner diameter of around 71mm. The       distal CBD sticture within the stent was initially dilated with a       balloon at 6 mm, 54mm then 77mm for 1 minute each. Thereafter, a hurricane       dilator of 4 cmX 53mm was used for further dilation which then allowed       for deeper cannulation and further advancement of the balloon catheter.       Occlusion cholangiogram performed thereafter which did  not reveal any       further filling defect. Ballon sweep with a 9 mm balloon retrieved some       debris and sludge. One 8 mm by 6 cm partially covered metal stent was       placed 5 cm into the common bile duct within the previously placed       uncovered stent. Bile flowed through the stent. The stent was in good       position. Impression:               - A single localized biliary stricture was found in                            the lower third of the main bile duct. The                            stricture was malignant appearing secondary to                            tumor ingrowth.                           - The lower third of the main bile duct was dilated.                           - One partially covered metal stent was placed into                            the common bile duct. Moderate Sedation:      Patient did not receive moderate sedation for this procedure, but       instead received monitored anesthesia care. Recommendation:           - Resume regular diet.                           - Check liver enzymes (AST, ALT, alkaline                            phosphatase, bilirubin) tomorrow. Procedure Code(s):        --- Professional ---                           [id number], Endoscopic retrograde  cholangiopancreatography (ERCP); with placement of                            endoscopic stent into biliary or pancreatic duct,                            including pre- and post-dilation and guide wire                            passage, when performed, including sphincterotomy,                            when performed, each stent                           62703, Endoscopic catheterization of the biliary                            ductal system, radiological supervision and                            interpretation Diagnosis Code(s):        --- Professional ---                           K83.1, Obstruction of bile duct                           K83.09,  Other cholangitis                           K83.8, Other specified diseases of biliary tract CPT copyright 2017 American Medical Association. All rights reserved. The codes documented in this report are preliminary and upon coder review may  be revised to meet current compliance requirements. [name], MD 04/14/2018 2:48:54 PM This report has been signed electronically. Number of Addenda: 0

## 2018-04-14 NOTE — Care Management Note (Signed)
Case Management Note  Patient Details  Name: LYSETTE LINDENBAUM MRN: [id number] Date of Birth: 09-04-1950  Subjective/Objective:                  OBSTRUCTED BILIARY STENT WITH ABD PAIN N&N/bld.cultures are + for G-R and klebisella pneumoniae/wcb=16.2/K+=3.3/hypotensive  Action/Plan: Iv ns, iv rocephon,iv pepcid,iv flagyl/ kcl 10 Meg in 100cc/ns runs x4/on [place] does desat. To 89% Will follow for cm needs and progression. Expected Discharge Date:  (unknown)               Expected Discharge Plan:  Home/Self Care  In-House Referral:     Discharge planning Services  CM Consult  Post Acute Care Choice:    Choice offered to:     DME Arranged:    DME Agency:     HH Arranged:    HH Agency:     Status of Service:  In process, will continue to follow  If discussed at Long Length of Stay Meetings, dates discussed:    Additional Comments:  Leeroy Cha, RN 04/14/2018, 10:42 AM

## 2018-04-14 NOTE — Progress Notes (Signed)
PROGRESS NOTE    Haley [name]  SWN:[id number] DOB: 05/03/1951 DOA: 04/12/2018 PCP: Alycia Rossetti, MD    Brief Narrative:  67 year old female who presented with altered mentation, fever and weakness.  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  Reported 24 hours of abdominal pain, diffuse, rapidly progressing, associated with fever, generalized weakness and altered mentation.  On her initial physical examination blood pressure 102/57, heart rate 80, temperature 102.8, respiratory rate 26, oxygen saturation 95%.  Dry mucous membranes, lungs with no wheezing or rhonchi, heart S1-S2 present, rhythmic, abdomen was mildly distended, tender, positive rigidity and guarding.  Positive Murphy sign.  No lower extremity edema.  Sodium 124, potassium 3.8, chloride 91, creatinine 2, glucose 199, BUN 9, creatinine 0.4, alkaline phosphatase 519, AST 282, ALT 204, lipase 26, white count 13.8, hemoglobin 12.6, hematocrit 36.7, platelets 436.  Urinalysis negative for infection.  P with no acute changes.  Chest x-ray with right hemidiaphragm elevation, no infiltrates.  CT of the abdomen with biliary stent in place, overall decreased pneumobilia since prior CT, interval increase intrahepatic biliary dilatation,  findings concerning for in-stent occlusion.  EKG sinus tachycardia  Patient was admitted to the hospital working diagnosis of sepsis due to cholangitis related to biliary in-stent occlusion.  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 Continue with IV fluids with LR at 75 ml per hour, and IV antibiotic therapy ceftriaxone, cultures positive for Klebseila pna, worsening leukocytosis, with WBC at 16. Patient will have ERCP today to address obstruction. Continue famotidine. Patient on stress dose steroids  2. Malignant obstructive jaundice, liver mets and pancreatic head. Progressive disease, continue supportive medical care for now, follows with Dr. Irene Limbo as outpatient.  3. Hyponatremia, hypokalemia, hypomagnesemia and hypophosphatemia. Will continue hydration with IV fluids,  Continue correction of K with Kcl, follow Mg in am, patient currently npo for procedure. Preserved renal function.   4. Back pain. CT with pathologic L2 fracture, will continue pain control with ketorolac. Continue as needed morphine.    DVT prophylaxis:  Enoxaparin   Code Status:  full Family Communication: no family at the bedside  Disposition Plan/ discharge barriers:  Pending clinical improvement.    Consultants:     Procedures:     Antimicrobials:   Ceftriaxone IV     Subjective: Patient not able to sleep well last night, abdominal pain improved with IV morphine but persistent back pain, no nausea or vomiting, no dyspnea or chest pain.   Objective: Vitals:   04/14/18 0300 04/14/18 0330 04/14/18 0500 04/14/18 0800  BP: 120/60     Pulse: 67     Resp: 17     Temp:  97.7 F (36.5 C)  98 F (36.7 C)  TempSrc:  Oral  Oral  SpO2: 94%     Weight:   56.4 kg   Height:        Intake/Output Summary (Last 24 hours) at 04/14/2018 9381  Last data filed at 04/14/2018 0425 Gross per 24 hour  Intake 1666.93 ml  Output 400 ml  Net 1266.93 ml   Filed Weights   04/12/18 2108 04/13/18 0445 04/14/18 0500  Weight: 55.8 kg 56.2 kg 56.4 kg    Examination:   General: deconditioned and ill looking appearing Neurology: Awake and alert, non focal  E  ENT: mild pallor, mild icterus, oral mucosa moist Cardiovascular: No JVD. S1-S2 present, rhythmic, no gallops, rubs, or murmurs. No lower extremity edema. Pulmonary: positive breath sounds bilaterally, adequate air movement, no wheezing, rhonchi or rales. Gastrointestinal. Abdomen with mild distention no organomegaly, non tender, no rebound or guarding Skin. No rashes Musculoskeletal: no joint deformities     Data Reviewed: I have personally reviewed following labs and imaging studies  CBC: Recent Labs  Lab 04/12/18 2151 04/13/18 0307 04/14/18 0410  WBC 13.8* 15.6* 16.2*  NEUTROABS  --   --  14.9*  HGB 12.6 11.5* 9.9*  HCT 36.7 33.7* 29.0*  MCV 86.4 85.8 86.1  PLT 436* 318 295   Basic Metabolic Panel: Recent Labs  Lab 04/12/18 2151 04/13/18 0324 04/13/18 0631 04/13/18 0640 04/13/18 1940 04/14/18 0410  NA 124* 129* 131*  --  132* 136  K 3.8 2.6* 2.6*  --  4.3 3.3*  CL 91* 97* 97*  --  99 104  CO2 22 22 21*  --  22 24  GLUCOSE 199* 134* 116*  --  181* 129*  BUN 9 7* 7*  --  8 8  CREATININE 0.40* <0.30* 0.33*  --  0.33* <0.30*  CALCIUM 8.3* 7.8* 7.4*  --  7.0* 7.0*  MG  --   --  1.4*  --   --   --   PHOS  --  1.9* 2.6 1.8*  --   --    GFR: CrCl cannot be calculated (This lab value cannot be used to calculate CrCl because it is not a number: <0.30). Liver Function Tests: Recent Labs  Lab 04/12/18 2151 04/13/18 0324 04/13/18 0631 04/14/18 0410  AST 282*  --   --  77*  ALT 204*  --   --  114*  ALKPHOS 519*  --   --  364*  BILITOT 3.6*  --   --  1.2  PROT 7.0  --   --  5.6*  ALBUMIN 3.1* 2.7* 2.4* 2.5*   Recent Labs  Lab 04/12/18 2151  LIPASE 26   Recent Labs  Lab 04/12/18 2200  AMMONIA 58*   Coagulation Profile: Recent Labs  Lab 04/14/18 0410  INR 1.29   Cardiac Enzymes: No results for input(s): CKTOTAL, CKMB, CKMBINDEX, TROPONINI in the last 168 hours. BNP (last 3 results) No results for input(s): PROBNP in the last 8760 hours. HbA1C: No  results for input(s): HGBA1C in the last 72 hours. CBG: No results for input(s): GLUCAP in the last 168 hours. Lipid Profile: No results for input(s): CHOL, HDL, LDLCALC, TRIG, CHOLHDL, LDLDIRECT in the last 72 hours. Thyroid Function Tests: No results for input(s): TSH, T4TOTAL, FREET4, T3FREE, THYROIDAB in the last 72 hours. Anemia Panel: No results for input(s): VITAMINB12, FOLATE, FERRITIN, TIBC, IRON, RETICCTPCT in the last 72 hours.    Radiology Studies: I have reviewed all of the imaging during this hospital visit personally     Scheduled Meds: . Chlorhexidine Gluconate Cloth  6 each Topical Daily  . feeding supplement  1 Container Oral TID BM  . heparin  5,000 Units Subcutaneous Q8H  . hydrocortisone  sod succinate (SOLU-CORTEF) inj  50 mg Intravenous Q6H  . mouth rinse  15 mL Mouth Rinse BID   Continuous Infusions: . sodium chloride 10 mL/hr at 04/13/18 0800  . sodium chloride 10 mL/hr at 04/13/18 1600  . sodium chloride    . cefTRIAXone (ROCEPHIN)  IV Stopped (04/13/18 1556)  . famotidine (PEPCID) IV Stopped (04/13/18 2140)  . metronidazole Stopped (04/14/18 2956)     LOS: 1 day        Charelle Petrakis Gerome Apley, MD [facility] Pager [phone]

## 2018-04-14 NOTE — Op Note (Signed)
ERCP was performed for therapy of ascending cholangitis,metastatic small cell cancer with previously placed uncovered metal biliary stent in 3/19 and suspected stent occlusion.   Findings: The previously placed stent was in good position. Stricture was noted in distal CBD due to tumor ingrowth within in a diameter of 4 mm. After wire placement into the biliary tree, dilatation was performed with 6 mm, 7 mm, 8 mm esophageal balloon each for 1 minute and then with a hurricane dilator of 8 mm for another 1 minute. Deep cannulation with sphincterotome was achieved subsequently. Balloon sweep with a 9 mm balloon retrieved some sludge and food debris. Occlusion cholangiogram did not reveal any further filling defects or luminal narrowing. A partially covered metal stent of 6 cm 8 mm caliber was deployed within the previously placed stent. Good flow of bile was noted thereafter. The pancreatic duct was never injected or cannulated during the entire procedure.  Recommendations Regular diet Follow LFTs in a.m.  [name], M.D.

## 2018-04-14 NOTE — Progress Notes (Signed)
eLink Physician-Brief Progress Note Patient Name: OPALINE REYBURN DOB: November 08, 1950 MRN: [id number]   Date of Service  04/14/2018  HPI/Events of Note  Soft BP while sleeping. Other VS and UOP good. Note cx results, abx regimen  eICU Interventions  Single small NS bolus, follow     Intervention Category Major Interventions: Hypotension - evaluation and management  Collene Gobble 04/14/2018, 12:04 AM

## 2018-04-14 NOTE — Interval H&P Note (Signed)
History and Physical Interval Note: 66/female with metastatic small cell cancer of lung, s/p chemotherapy, ERCP with uncovered metal stent placed in 3/19 at [facility], presented with bacteremia, fever secondary to cholangitis likely due to stent occlusion. 04/14/2018 12:30 PM  Haley [name]  has presented today for ERCP with stent placement, with the diagnosis of cholangitis  The various methods of treatment have been discussed with the patient and family. After consideration of risks, benefits and other options for treatment, the patient has consented to  Procedure(s) with comments: ENDOSCOPIC RETROGRADE CHOLANGIOPANCREATOGRAPHY (ERCP) (N/A) - with stent change as a surgical intervention .  The patient's history has been reviewed, patient examined, no change in status, stable for surgery.  I have reviewed the patient's chart and labs.  Questions were answered to the patient's satisfaction.     [name]

## 2018-04-14 NOTE — Anesthesia Preprocedure Evaluation (Addendum)
Anesthesia Evaluation  Patient identified by MRN, date of birth, ID band Patient awake    Reviewed: Allergy & Precautions, NPO status , Patient's Chart, lab work & pertinent test results  Airway Mallampati: II  TM Distance: >3 FB     Dental   Pulmonary pneumonia, former smoker,    breath sounds clear to auscultation       Cardiovascular negative cardio ROS   Rhythm:Regular Rate:Normal     Neuro/Psych    GI/Hepatic negative GI ROS, Neg liver ROS,   Endo/Other  negative endocrine ROS  Renal/GU negative Renal ROS     Musculoskeletal   Abdominal   Peds  Hematology   Anesthesia Other Findings   Reproductive/Obstetrics                             Anesthesia Physical Anesthesia Plan  ASA: III  Anesthesia Plan: General   Post-op Pain Management:    Induction: Intravenous  PONV Risk Score and Plan: Treatment may vary due to age or medical condition  Airway Management Planned: Oral ETT  Additional Equipment:   Intra-op Plan:   Post-operative Plan: Extubation in OR  Informed Consent: I have reviewed the patients History and Physical, chart, labs and discussed the procedure including the risks, benefits and alternatives for the proposed anesthesia with the patient or authorized representative who has indicated his/her understanding and acceptance.   Dental advisory given  Plan Discussed with: CRNA and Anesthesiologist  Anesthesia Plan Comments:         Anesthesia Quick Evaluation

## 2018-04-15 ENCOUNTER — Encounter (HOSPITAL_COMMUNITY): Payer: Self-pay | Admitting: Gastroenterology

## 2018-04-15 DIAGNOSIS — R930 Abnormal findings on diagnostic imaging of skull and head, not elsewhere classified: Secondary | ICD-10-CM

## 2018-04-15 DIAGNOSIS — R7881 Bacteremia: Secondary | ICD-10-CM

## 2018-04-15 DIAGNOSIS — R1084 Generalized abdominal pain: Secondary | ICD-10-CM

## 2018-04-15 LAB — CBC WITH DIFFERENTIAL/PLATELET
BASOS ABS: 0 10*3/uL (ref 0.0–0.1)
BASOS PCT: 0 %
EOS ABS: 0 10*3/uL (ref 0.0–0.7)
EOS PCT: 0 %
HCT: 30 % — ABNORMAL LOW (ref 36.0–46.0)
Hemoglobin: 10 g/dL — ABNORMAL LOW (ref 12.0–15.0)
Lymphocytes Relative: 10 %
Lymphs Abs: 1.1 10*3/uL (ref 0.7–4.0)
MCH: 29 pg (ref 26.0–34.0)
MCHC: 33.3 g/dL (ref 30.0–36.0)
MCV: 87 fL (ref 78.0–100.0)
MONO ABS: 0.6 10*3/uL (ref 0.1–1.0)
MONOS PCT: 6 %
Neutro Abs: 8.6 10*3/uL — ABNORMAL HIGH (ref 1.7–7.7)
Neutrophils Relative %: 84 %
PLATELETS: 282 10*3/uL (ref 150–400)
RBC: 3.45 MIL/uL — ABNORMAL LOW (ref 3.87–5.11)
RDW: 15.9 % — AB (ref 11.5–15.5)
WBC: 10.2 10*3/uL (ref 4.0–10.5)

## 2018-04-15 LAB — BASIC METABOLIC PANEL
ANION GAP: 10 (ref 5–15)
BUN: 6 mg/dL — AB (ref 8–23)
CALCIUM: 7.5 mg/dL — AB (ref 8.9–10.3)
CO2: 22 mmol/L (ref 22–32)
Chloride: 97 mmol/L — ABNORMAL LOW (ref 98–111)
Creatinine, Ser: 0.36 mg/dL — ABNORMAL LOW (ref 0.44–1.00)
GFR calc Af Amer: >60 mL/min (ref >60)
GFR calc non Af Amer: >60 mL/min (ref >60)
GLUCOSE: 109 mg/dL — AB (ref 70–99)
Potassium: 3.7 mmol/L (ref 3.5–5.1)
SODIUM: 129 mmol/L — AB (ref 135–145)

## 2018-04-15 LAB — CULTURE, BLOOD (ROUTINE X 2)
SPECIAL REQUESTS: ADEQUATE
Special Requests: ADEQUATE

## 2018-04-15 LAB — HEPATIC FUNCTION PANEL
ALT: 78 U/L — ABNORMAL HIGH (ref 0–44)
AST: 34 U/L (ref 15–41)
Albumin: 2.6 g/dL — ABNORMAL LOW (ref 3.5–5.0)
Alkaline Phosphatase: 298 U/L — ABNORMAL HIGH (ref 38–126)
BILIRUBIN TOTAL: 1 mg/dL (ref 0.3–1.2)
Bilirubin, Direct: 0.4 mg/dL — ABNORMAL HIGH (ref 0.0–0.2)
Indirect Bilirubin: 0.6 mg/dL (ref 0.3–0.9)
TOTAL PROTEIN: 5.8 g/dL — AB (ref 6.5–8.1)

## 2018-04-15 LAB — MAGNESIUM: MAGNESIUM: 1.8 mg/dL (ref 1.7–2.4)

## 2018-04-15 MED ORDER — HYDROCORTISONE NA SUCCINATE PF 100 MG IJ SOLR
50.0000 mg | Freq: Two times a day (BID) | INTRAMUSCULAR | Status: DC
  Administered 2018-04-16 – 2018-04-18 (×4): 50 mg via INTRAVENOUS
  Filled 2018-04-15 (×6): qty 1

## 2018-04-15 MED ORDER — ZOLPIDEM TARTRATE 5 MG PO TABS
5.0000 mg | ORAL_TABLET | Freq: Every evening | ORAL | Status: DC | PRN
  Administered 2018-04-17: 5 mg via ORAL
  Filled 2018-04-15: qty 1

## 2018-04-15 NOTE — Progress Notes (Signed)
No specific complaints today status post ERCP with metal biliary stent placed yesterday for ascending cholangitis. Review of chart shows she has tumor in the CBD. LFTs today trending down. Follow clinically.

## 2018-04-15 NOTE — Progress Notes (Signed)
PROGRESS NOTE    Haley [name]  KNL:[id number] DOB: 07/27/1951 DOA: 04/12/2018 PCP: Alycia Rossetti, MD   Brief Narrative: Haley [name] is a 67 y.o. female with a history of small cell carcinoma (neuroendocrine) with metastasis. She presented with altered mental status and weakness and found to have sepsis. Improved with empiric antibiotics.   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 Physiology improved. -Continue antibiotics  Klebsiella pneumoniae bacteremia -Infectious disease consult -Consult general surgery for port removal -Repeat blood cultures -Continue ceftriaxone  Malignant obstructive jaundice Patient with with liver metastasis and pancreatic head metastasis. Recently completed a round of chemotherapy. 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 -Taper down stress dose steroids   DVT prophylaxis: Heparin Code Status:   Code Status: Full Code Family Communication: None at bedside Disposition Plan: Discharge pending management of infection   Consultants:   Infectious disease  General surgery  Procedures:   None  Antimicrobials:  Vancomycin  Flagyl  Cefepime  Ceftriaxone    Subjective: No issues today. Feels better  Objective: Vitals:   04/15/18 0400 04/15/18 0530 04/15/18 0800 04/15/18 0801  BP: (!) 157/65  (!) 161/92   Pulse: (!) 48  (!) 52   Resp: 16  18   Temp:  98 F (36.7 C)  97.6 F (36.4 C)  TempSrc:  Oral  Oral  SpO2: 93%  97%   Weight:  56.4 kg    Height:        Intake/Output Summary (Last 24 hours) at 04/15/2018 0841 Last data filed at 04/15/2018 0600 Gross per 24 hour  Intake 2403.68 ml  Output 2300 ml  Net 103.68 ml   Filed Weights   04/14/18 0500 04/14/18 1238 04/15/18 0530  Weight: 56.4 kg 56.4 kg 56.4 kg    Examination:  General exam: Appears calm and comfortable Respiratory system: Clear to auscultation. Respiratory effort normal. Cardiovascular system: S1 & S2 heard, RRR. No murmurs, rubs, gallops or clicks. Gastrointestinal system: Abdomen is nondistended, soft and nontender. No organomegaly or masses felt. Normal bowel sounds heard. Central nervous system: Alert and oriented. No focal neurological deficits. Extremities: No edema. No calf tenderness Skin: No cyanosis. No rashes Psychiatry: Judgement and insight appear normal. Mood & affect appropriate.     Data Reviewed: I have personally reviewed following labs and imaging studies  CBC: Recent Labs  Lab 04/12/18 2151 04/13/18 0307 04/14/18 0410 04/15/18 0750  WBC 13.8* 15.6* 16.2* 10.2  NEUTROABS  --   --  14.9* 8.6*  HGB 12.6 11.5* 9.9* 10.0*  HCT 36.7 33.7* 29.0* 30.0*  MCV 86.4 85.8 86.1 87.0  PLT 436* 318 324 790   Basic Metabolic Panel: Recent Labs  Lab 04/13/18 0324 04/13/18 0631 04/13/18 0640 04/13/18 1940 04/14/18 0410 04/15/18 0500  NA 129* 131*  --  132* 136 129*  K 2.6* 2.6*  --  4.3 3.3* 3.7  CL 97* 97*  --  99 104 97*  CO2 22 21*  --  22 24 22   GLUCOSE 134* 116*  --  181* 129* 109*  BUN 7* 7*  --  8 8 6*  CREATININE <0.30* 0.33*  --  0.33* <0.30* 0.36*  CALCIUM 7.8* 7.4*  --  7.0* 7.0* 7.5*  MG  --  1.4*  --   --   --  1.8  PHOS 1.9* 2.6 1.8*  --   --   --    GFR: Estimated Creatinine  Clearance: 58 mL/min (A) (by C-G formula based on SCr of 0.36 mg/dL (L)). Liver Function Tests: Recent Labs  Lab 04/12/18 2151 04/13/18 0324 04/13/18 0631 04/14/18 0410  AST 282*  --   --  77*  ALT 204*  --   --  114*  ALKPHOS 519*  --   --  364*  BILITOT 3.6*  --   --  1.2  PROT 7.0  --   --  5.6*  ALBUMIN 3.1* 2.7* 2.4* 2.5*   Recent Labs  Lab 04/12/18 2151  LIPASE 26   Recent Labs  Lab 04/12/18 2200  AMMONIA 58*   Coagulation Profile: Recent Labs  Lab 04/14/18 0410  INR 1.29   Cardiac Enzymes: No results for input(s): CKTOTAL, CKMB, CKMBINDEX, TROPONINI in the last 168 hours. BNP (last 3 results) No results for input(s): PROBNP in the last 8760 hours. HbA1C: No results for input(s): HGBA1C in the last 72 hours. CBG: No results for input(s): GLUCAP in the last 168 hours. Lipid Profile: No results for input(s): CHOL, HDL, LDLCALC, TRIG, CHOLHDL, LDLDIRECT in the last 72 hours. Thyroid Function Tests: No results for input(s): TSH, T4TOTAL, FREET4, T3FREE, THYROIDAB in the last 72 hours. Anemia Panel: No results for input(s): VITAMINB12, FOLATE, FERRITIN, TIBC, IRON, RETICCTPCT in the last 72 hours. Sepsis Labs: Recent Labs  Lab 04/12/18 2203 04/12/18 2359 04/13/18 0340 04/13/18 0631  LATICACIDVEN 2.48* 3.14* 2.0* 1.4    Recent Results (from the past 240 hour(s))  Blood Culture (routine x 2)     Status: Abnormal (Preliminary result)   Collection Time: 04/12/18  9:27 PM  Result Value Ref Range Status   Specimen Description   Final    BLOOD LEFT ANTECUBITAL Performed at [facility], [place] [address], [place], [place] [address]    Special Requests   Final    BOTTLES DRAWN AEROBIC AND ANAEROBIC Blood Culture adequate volume Performed at [facility] [address], [place], [place] [address]    Culture  Setup Time   Final    GRAM NEGATIVE RODS IN BOTH AEROBIC AND ANAEROBIC BOTTLES CRITICAL RESULT CALLED TO, READ  BACK BY AND VERIFIED WITH: Guadlupe Spanish PharmD 11:35 04/13/18 ([email or web address]) Performed at [facility], [place] [address] [place], [place] [address]    Culture KLEBSIELLA PNEUMONIAE (A)  Final   Report Status PENDING  Incomplete  Blood Culture ID Panel (Reflexed)     Status: Abnormal   Collection Time: 04/12/18  9:27 PM  Result Value Ref Range Status   Enterococcus species NOT DETECTED NOT DETECTED Final   Listeria monocytogenes NOT DETECTED NOT DETECTED Final   Staphylococcus species NOT DETECTED NOT DETECTED Final   Staphylococcus aureus NOT DETECTED NOT DETECTED Final   Streptococcus species NOT DETECTED NOT DETECTED Final   Streptococcus agalactiae NOT DETECTED NOT DETECTED Final   Streptococcus pneumoniae NOT DETECTED NOT DETECTED Final   Streptococcus  pyogenes NOT DETECTED NOT DETECTED Final   Acinetobacter baumannii NOT DETECTED NOT DETECTED Final   Enterobacteriaceae species DETECTED (A) NOT DETECTED Final    Comment: Enterobacteriaceae represent a large family of gram-negative bacteria, not a single organism. CRITICAL RESULT CALLED TO, READ BACK BY AND VERIFIED WITH: Guadlupe Spanish PharmD 11:35 04/13/18 ([email or web address])    Enterobacter cloacae complex NOT DETECTED NOT DETECTED Final   Escherichia coli NOT DETECTED NOT DETECTED Final   Klebsiella oxytoca NOT DETECTED NOT DETECTED Final   Klebsiella pneumoniae DETECTED (A) NOT DETECTED Final    Comment: CRITICAL RESULT CALLED TO, READ BACK BY AND VERIFIED WITH: Guadlupe Spanish PharmD 11:35 04/13/18 ([email or web address])    Proteus species NOT DETECTED NOT DETECTED Final   Serratia marcescens NOT DETECTED NOT DETECTED Final   Carbapenem resistance NOT DETECTED NOT DETECTED Final   Haemophilus influenzae NOT DETECTED NOT DETECTED Final   Neisseria meningitidis NOT DETECTED NOT DETECTED Final   Pseudomonas aeruginosa NOT DETECTED NOT DETECTED Final   Candida albicans NOT DETECTED NOT DETECTED Final   Candida glabrata NOT DETECTED NOT DETECTED Final    Candida krusei NOT DETECTED NOT DETECTED Final   Candida parapsilosis NOT DETECTED NOT DETECTED Final   Candida tropicalis NOT DETECTED NOT DETECTED Final    Comment: Performed at [facility], [place] [address] [place], [place] [address]  Blood Culture (routine x 2)     Status: Abnormal (Preliminary result)   Collection Time: 04/12/18 10:17 PM  Result Value Ref Range Status   Specimen Description   Final    BLOOD LEFT ANTECUBITAL Performed at [facility] [address], [place], [place] [address]    Special Requests   Final    BOTTLES DRAWN AEROBIC AND ANAEROBIC Blood Culture adequate volume Performed at [facility] [address], [place], [place] [address]    Culture  Setup Time   Final    GRAM NEGATIVE RODS IN BOTH AEROBIC AND ANAEROBIC BOTTLES CRITICAL VALUE NOTED.  VALUE IS CONSISTENT WITH PREVIOUSLY REPORTED AND CALLED VALUE. Performed at [facility], [place] [address] [place], [place] [address]    Culture KLEBSIELLA PNEUMONIAE (A)  Final   Report Status PENDING  Incomplete  MRSA PCR Screening     Status: None   Collection Time: 04/13/18  4:43 AM  Result Value Ref Range Status   MRSA by PCR NEGATIVE NEGATIVE Final    Comment:        The GeneXpert MRSA Assay (FDA approved for NASAL specimens only), is one component of a comprehensive MRSA colonization surveillance program. It is not intended to diagnose MRSA infection nor to guide or monitor treatment for MRSA infections. Performed at [facility], [place] [address] [place], [place] [address]   Culture, Urine     Status: None   Collection Time: 04/13/18 12:49 PM  Result Value Ref Range Status   Specimen Description   Final    URINE, CLEAN CATCH Performed at [facility], [place] [address], [place], [place] [address]    Special Requests   Final    Immunocompromised Performed at [facility], [place] [address],  [place], [place] [address]    Culture   Final    NO GROWTH Performed at [facility], [place] [address], [place], [place] [address]    Report Status 04/14/2018 FINAL  Final         Radiology Studies: Dg Ercp  Result Date: 04/15/2018  CLINICAL DATA:  Metastatic small cell lung carcinoma with history of biliary obstruction and previous metallic stent placement in the common bile duct. Imaging suggestion of potential relative obstruction of the bile duct stent. EXAM: ERCP TECHNIQUE: Multiple spot images obtained with the fluoroscopic device and submitted for interpretation post-procedure. COMPARISON:  CT of the abdomen on 04/12/2018 FINDINGS: C-arm imaging during the endoscopic procedure demonstrates balloon dilatation of the indwelling metallic common bile duct stent. During the procedure there appears to be placement of an overlapping metallic stent. Occlusion cholangiogram shows opacification of the biliary tree proximal to the stented segment. IMPRESSION: Imaging obtained during balloon dilatation of an indwelling metallic common bile duct stent and placement of new overlapping metallic biliary stent. These images were submitted for radiologic interpretation only. Please see the procedural report for the amount of contrast and the fluoroscopy time utilized. Electronically Signed   By: Aletta Edouard M.D.   On: 04/15/2018 07:56        Scheduled Meds: . calcium carbonate  1 tablet Oral Q breakfast  . Chlorhexidine Gluconate Cloth  6 each Topical Daily  . feeding supplement  1 Container Oral TID BM  . heparin  5,000 Units Subcutaneous Q8H  . hydrocortisone sod succinate (SOLU-CORTEF) inj  50 mg Intravenous Q6H  . mouth rinse  15 mL Mouth Rinse BID  . multivitamin with minerals  1 tablet Oral Daily   Continuous Infusions: . sodium chloride 10 mL/hr at 04/13/18 0800  . sodium chloride 250 mL (04/14/18 2317)  . cefTRIAXone (ROCEPHIN)  IV 0 g (04/13/18 1556)  . dextrose 5% lactated ringers  75 mL/hr at 04/15/18 0612  . famotidine (PEPCID) IV Stopped (04/14/18 2156)  . lactated ringers Stopped (04/14/18 1500)     LOS: 2 days     Cordelia Poche, MD [facility] 04/15/2018, 8:41 AM Pager: [phone]  If 7PM-7AM, please contact night-coverage [email or web address] 04/15/2018, 8:41 AM

## 2018-04-15 NOTE — Care Management Note (Signed)
Case Management Note  Patient Details  Name: Haley [name] MRN: [id number] Date of Birth: [birth year]  Subjective/Objective:                  ERCP was performed for therapy of ascending cholangitis,metastatic small cell cancer with previously placed uncovered metal biliary stent in 3/19 and suspected stent occlusion. Findings: The previously placed stent was in good position. Stricture was noted in distal CBD due to tumor ingrowth within in a diameter of 4 mm. After wire placement into the biliary tree, dilatation was performed with 6 mm, 7 mm, 8 mm esophageal balloon each for 1 minute and then with a hurricane dilator of 8 mm for another 1 minute. Deep cannulation with sphincterotome was achieved subsequently. Balloon sweep with a 9 mm balloon retrieved some sludge and food debris. Occlusion cholangiogram did not reveal any further filling defects or luminal narrowing. A partially covered metal stent of 6 cm 8 mm caliber was deployed within the previously placed stent. Good flow of bile was noted thereafter. The pancreatic duct was never injected or cannulated during the entire procedure.  Action/Plan: Following for progression and cmneeds  Expected Discharge Date:  (unknown)               Expected Discharge Plan:  Home/Self Care  In-House Referral:     Discharge planning Services  CM Consult  Post Acute Care Choice:    Choice offered to:     DME Arranged:    DME Agency:     HH Arranged:    [place] Agency:     Status of Service:  In process, will continue to follow  If discussed at Long Length of Stay Meetings, dates discussed:    Additional Comments:  Leeroy Cha, RN 04/15/2018, 9:31 AM

## 2018-04-15 NOTE — Plan of Care (Signed)
Pt states that the pain in her abdomen has come down considerably since she was admitted.

## 2018-04-15 NOTE — Consult Note (Signed)
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 female with metastatic extensive stage small call lung ca including bone, hepatic and pancreatic lesions in feb 2019 s/p patient has completed 4 cycles of carboplatin/etoposide/Atezolizumab and completed -completed WBRT 30 Gy in 10 fractions 01/15/2018 - 01/29/2018. Her course was complicated by pancreatic metastasis causing biliary obstruction s/p  ERCP, sphincterotomy and  stent placement uncovered stent placed size 808 to CBD PET scan in may showed improvement. She last saw dr Irene Limbo at end of July where decision made to hold Atezolizumab while the pt is having continued abdominal pain/discomfort, and liver enzyme fluctuations. She was admitted on 8/19 with acute onset abdominal pain, altered mental status, fever and weakness- found to have AMS/unresponsive, with hypotension, fever of 103F, labs notable for leukocytosis of 16K-93%N,  Mildly elevated AST/ALT and elevated ALP. CT findings is concerning for stent occlusion and Increased size of hepatic metastatic lesions consistent with disease progression. She underwent ERCP who found stricture in distal to her current stent due to tumor ingrowth. Dr Therisa Doyne placed an additional stent to relieve obstruction. Her blood cx grew 4/4 bottles of pan sensitive kleb pneumoniae (amp R) on admit blood cx.  Leukocytosis resolved and now afebrile since fluid resuscitation, abtx and less abd pain since ERCP. She has portacath in place since she had previously been receiving chemo. Dr Lonny Prude asking ID to weigh in to whether need removal of port a cath  She reports still having some early satiety and certain foods causing abdominal discomfort in addition to constipation.  Past Medical History:  Diagnosis Date  . Allergy   . Back pain   . Osteopenia   . Small cell lung cancer ([place]) 10/17/2017   Extended stage with metastases to liver and bone  . Tubular adenoma of colon 10/26/2014    Allergies: No Known Allergies   MEDICATIONS: . calcium carbonate  1 tablet Oral Q breakfast  . Chlorhexidine Gluconate Cloth  6 each Topical Daily  . feeding supplement  1 Container Oral TID BM  . heparin  5,000 Units Subcutaneous Q8H  . hydrocortisone sod succinate (SOLU-CORTEF) inj  50 mg Intravenous Q6H  . mouth rinse  15 mL Mouth Rinse BID  . multivitamin with minerals  1 tablet Oral Daily    Social History   Tobacco Use  . Smoking status: Former Smoker    Packs/day: 1.00    Years: 40.00    Pack years: 40.00    Types: Cigarettes    Last attempt to quit: 09/24/2017    Years since quitting: 0.5  . Smokeless tobacco: Never Used  Substance Use Topics  . Alcohol use: Yes    Comment: rare social  . Drug use: No    Family History  Problem  Relation Age of Onset  . Diabetes Mother   . Hypertension Mother   . Diabetes Sister   . Hypertension Sister   . Hyperlipidemia Sister     Review of Systems  Constitutional: positive  for fever, chills, diaphoresis, activity change, appetite change, fatigue and unexpected weight change.  HENT: Negative for congestion, sore throat, rhinorrhea, sneezing, trouble swallowing and sinus pressure.  Eyes: Negative for photophobia and visual disturbance.  Respiratory: Negative for cough, chest tightness, shortness of breath, wheezing and stridor.  Cardiovascular:  Negative for chest pain, palpitations and leg swelling.  Gastrointestinal:positive for nausea, vomiting, abdominal pain,  Constipation, but negative for diarrhea, blood in stool, abdominal distention and anal bleeding.  Genitourinary: Negative for dysuria, hematuria, flank pain and difficulty urinating.  Musculoskeletal: Negative for myalgias, back pain, joint swelling, arthralgias and gait problem.  Skin: Negative for color change, pallor, rash and wound.  Neurological: Negative for dizziness, tremors, weakness and light-headedness.  Hematological: Negative for adenopathy. Does not bruise/bleed easily.  Psychiatric/Behavioral: Negative for behavioral problems, confusion, sleep disturbance, dysphoric mood, decreased concentration and agitation.     OBJECTIVE: Temp:  [97.6 F (36.4 C)-98.2 F (36.8 C)] 98.2 F (36.8 C) (08/21 1153) Pulse Rate:  [48-75] 50 (08/21 1200) Resp:  [15-23] 23 (08/21 1200) BP: (121-162)/(63-128) 161/79 (08/21 1200) SpO2:  [93 %-99 %] 96 % (08/21 1200) Weight:  [56.4 kg] 56.4 kg (08/21 0530) Physical Exam  Constitutional:  oriented to person, place, and time. appears well-developed and well-nourished.looks fatigued, hair loss from chemo. No distress.  HENT: [place]/AT, PERRLA, no scleral icterus Mouth/Throat: Oropharynx is clear and moist. No oropharyngeal exudate.  Cardiovascular: Normal rate, regular rhythm and normal heart sounds. Exam reveals no gallop and no friction rub.  No murmur heard.  Chest wall = right upper chest portacath in place -no erythema Pulmonary/Chest: Effort normal and breath sounds normal. No respiratory distress.  has no wheezes.  Neck = supple, no nuchal rigidity Abdominal: Soft. Bowel sounds are normal.  exhibits no distension. There is no tenderness.  Lymphadenopathy: no cervical adenopathy. No axillary adenopathy Neurological: alert and oriented to person, place, and time.  Skin: Skin is warm and dry. No rash noted. No erythema.    Psychiatric: a normal mood and affect.  behavior is normal.    LABS: Results for orders placed or performed during the hospital encounter of 04/12/18 (from the past 48 hour(s))  BMET today at 2000     Status: Abnormal   Collection Time: 04/13/18  7:40 PM  Result Value Ref Range   Sodium 132 (L) 135 - 145 mmol/L   Potassium 4.3 3.5 - 5.1 mmol/L    Comment: DELTA CHECK NOTED REPEATED TO VERIFY NO VISIBLE HEMOLYSIS    Chloride 99 98 - 111 mmol/L   CO2 22 22 - 32 mmol/L   Glucose, Bld 181 (H) 70 - 99 mg/dL   BUN 8 8 - 23 mg/dL   Creatinine, Ser 0.33 (L) 0.44 - 1.00 mg/dL   Calcium 7.0 (L) 8.9 - 10.3 mg/dL   GFR calc non Af Amer >60 >60 mL/min   GFR calc Af Amer >60 >60 mL/min    Comment: (NOTE) The eGFR has been calculated using the CKD EPI equation. This calculation has not been validated in all clinical situations. eGFR's persistently <60 mL/min signify possible Chronic Kidney Disease.    Anion gap 11 5 - 15    Comment: Performed at [facility], [place] [address] [place], [place] [address]  Protime-INR  Status: Abnormal   Collection Time: 04/14/18  4:10 AM  Result Value Ref Range   Prothrombin Time 16.0 (H) 11.4 - 15.2 seconds   INR 1.29     Comment: Performed at [facility], [place] [address], [place], [place] [address]  Comprehensive metabolic panel     Status: Abnormal   Collection Time: 04/14/18  4:10 AM  Result Value Ref Range   Sodium 136 135 - 145 mmol/L   Potassium 3.3 (L) 3.5 - 5.1 mmol/L    Comment: DELTA CHECK NOTED REPEATED TO VERIFY NO VISIBLE HEMOLYSIS    Chloride 104 98 - 111 mmol/L   CO2 24 22 - 32 mmol/L   Glucose, Bld 129 (H) 70 - 99 mg/dL   BUN 8 8 - 23 mg/dL   Creatinine, Ser <0.30 (L) 0.44 - 1.00 mg/dL   Calcium 7.0 (L) 8.9 - 10.3 mg/dL   Total Protein 5.6 (L) 6.5 - 8.1 g/dL   Albumin 2.5 (L) 3.5 - 5.0 g/dL   AST 77 (H) 15 - 41 U/L   ALT 114 (H) 0 - 44 U/L   Alkaline Phosphatase 364 (H) 38 - 126 U/L    Total Bilirubin 1.2 0.3 - 1.2 mg/dL   GFR calc non Af Amer NOT CALCULATED >60 mL/min   GFR calc Af Amer NOT CALCULATED >60 mL/min    Comment: (NOTE) The eGFR has been calculated using the CKD EPI equation. This calculation has not been validated in all clinical situations. eGFR's persistently <60 mL/min signify possible Chronic Kidney Disease.    Anion gap 8 5 - 15    Comment: Performed at [facility], [place] [address], [place], [place] [address]  CBC with Differential/Platelet     Status: Abnormal   Collection Time: 04/14/18  4:10 AM  Result Value Ref Range   WBC 16.2 (H) 4.0 - 10.5 K/uL   RBC 3.37 (L) 3.87 - 5.11 MIL/uL   Hemoglobin 9.9 (L) 12.0 - 15.0 g/dL   HCT 29.0 (L) 36.0 - 46.0 %   MCV 86.1 78.0 - 100.0 fL   MCH 29.4 26.0 - 34.0 pg   MCHC 34.1 30.0 - 36.0 g/dL   RDW 15.7 (H) 11.5 - 15.5 %   Platelets 324 150 - 400 K/uL   Neutrophils Relative % 93 %   Neutro Abs 14.9 (H) 1.7 - 7.7 K/uL   Lymphocytes Relative 4 %   Lymphs Abs 0.7 0.7 - 4.0 K/uL   Monocytes Relative 3 %   Monocytes Absolute 0.5 0.1 - 1.0 K/uL   Eosinophils Relative 0 %   Eosinophils Absolute 0.0 0.0 - 0.7 K/uL   Basophils Relative 0 %   Basophils Absolute 0.0 0.0 - 0.1 K/uL    Comment: Performed at [facility], [place] [address], [place],  [address]  Basic metabolic panel     Status: Abnormal   Collection Time: 04/15/18  5:00 AM  Result Value Ref Range   Sodium 129 (L) 135 - 145 mmol/L    Comment: DELTA CHECK NOTED NO VISIBLE HEMOLYSIS    Potassium 3.7 3.5 - 5.1 mmol/L   Chloride 97 (L) 98 - 111 mmol/L   CO2 22 22 - 32 mmol/L   Glucose, Bld 109 (H) 70 - 99 mg/dL   BUN 6 (L) 8 - 23 mg/dL   Creatinine, Ser 0.36 (L) 0.44 - 1.00 mg/dL   Calcium 7.5 (L) 8.9 - 10.3 mg/dL   GFR calc non Af Amer >60 >  60 mL/min   GFR calc Af Amer >60 >60 mL/min    Comment: (NOTE) The eGFR has been calculated using the CKD EPI equation. This calculation has not been validated in  all clinical situations. eGFR's persistently <60 mL/min signify possible Chronic Kidney Disease.    Anion gap 10 5 - 15    Comment: Performed at [facility], [place] [address], [place], [place] [address]  Magnesium     Status: None   Collection Time: 04/15/18  5:00 AM  Result Value Ref Range   Magnesium 1.8 1.7 - 2.4 mg/dL    Comment: Performed at [facility], [place] [address], [place], [place] [address]  Hepatic function panel     Status: Abnormal   Collection Time: 04/15/18  5:00 AM  Result Value Ref Range   Total Protein 5.8 (L) 6.5 - 8.1 g/dL   Albumin 2.6 (L) 3.5 - 5.0 g/dL   AST 34 15 - 41 U/L   ALT 78 (H) 0 - 44 U/L   Alkaline Phosphatase 298 (H) 38 - 126 U/L   Total Bilirubin 1.0 0.3 - 1.2 mg/dL   Bilirubin, Direct 0.4 (H) 0.0 - 0.2 mg/dL   Indirect Bilirubin 0.6 0.3 - 0.9 mg/dL    Comment: Performed at [facility], [place] [address], [place], [place] [address]  CBC with Differential/Platelet     Status: Abnormal   Collection Time: 04/15/18  7:50 AM  Result Value Ref Range   WBC 10.2 4.0 - 10.5 K/uL   RBC 3.45 (L) 3.87 - 5.11 MIL/uL   Hemoglobin 10.0 (L) 12.0 - 15.0 g/dL   HCT 30.0 (L) 36.0 - 46.0 %   MCV 87.0 78.0 - 100.0 fL   MCH 29.0 26.0 - 34.0 pg   MCHC 33.3 30.0 - 36.0 g/dL   RDW 15.9 (H) 11.5 - 15.5 %   Platelets 282 150 - 400 K/uL   Neutrophils Relative % 84 %   Neutro Abs 8.6 (H) 1.7 - 7.7 K/uL   Lymphocytes Relative 10 %   Lymphs Abs 1.1 0.7 - 4.0 K/uL   Monocytes Relative 6 %   Monocytes Absolute 0.6 0.1 - 1.0 K/uL   Eosinophils Relative 0 %   Eosinophils Absolute 0.0 0.0 - 0.7 K/uL   Basophils Relative 0 %   Basophils Absolute 0.0 0.0 - 0.1 K/uL    Comment: Performed at [facility], [place] [address], [place],  [address]    MICRO:  IMAGING: Dg Ercp  Result Date: 04/15/2018 CLINICAL DATA:  Metastatic small cell lung carcinoma with history of biliary obstruction and  previous metallic stent placement in the common bile duct. Imaging suggestion of potential relative obstruction of the bile duct stent. EXAM: ERCP TECHNIQUE: Multiple spot images obtained with the fluoroscopic device and submitted for interpretation post-procedure. COMPARISON:  CT of the abdomen on 04/12/2018 FINDINGS: C-arm imaging during the endoscopic procedure demonstrates balloon dilatation of the indwelling metallic common bile duct stent. During the procedure there appears to be placement of an overlapping metallic stent. Occlusion cholangiogram shows opacification of the biliary tree proximal to the stented segment. IMPRESSION: Imaging obtained during balloon dilatation of an indwelling metallic common bile duct stent and placement of new overlapping metallic biliary stent. These images were submitted for radiologic interpretation only. Please see the procedural report for the amount of contrast and the fluoroscopy time utilized. Electronically Signed   By: Aletta Edouard M.D.   On: 04/15/2018 07:56  HISTORICAL MICRO/IMAGING  Assessment/Plan:  67yo F with hx of  Extensive metastatic small cell lung ca with previous hx of biliary obstruction due to metastatic lesions with metallic stent placement admitted for gram negative sepsis due to cholangitis - from progression of metastatic liver lesions and klebseilla bacteremia  - recommend to repeat blood cx today - plan to treat for 14 days using 8/20 as day 1 - would recommend for removal of portacath - concern there is biofilm that could possibly be a nidus for further infection once this course of iv abtx are completed - would give 1-2 d line free before getting picc line.  Metastatic cancer =  - she appears to have further progression of cancer, would defer to dr kale to see whether she is a candidate for further treatment.  Early satiety = likely from metastatic ca vs medication side effect

## 2018-04-16 ENCOUNTER — Inpatient Hospital Stay (HOSPITAL_COMMUNITY): Payer: Medicare Other | Admitting: Anesthesiology

## 2018-04-16 ENCOUNTER — Encounter (HOSPITAL_COMMUNITY)
Admission: EM | Disposition: A | Discharge status: Home Health | Payer: Self-pay | Source: Home / Self Care | Attending: Family Medicine

## 2018-04-16 DIAGNOSIS — E876 Hypokalemia: Secondary | ICD-10-CM

## 2018-04-16 DIAGNOSIS — R7881 Bacteremia: Secondary | ICD-10-CM

## 2018-04-16 HISTORY — PX: PORT-A-CATH REMOVAL: SHX5289

## 2018-04-16 LAB — BASIC METABOLIC PANEL
Anion gap: 12 (ref 5–15)
Anion gap: 12 (ref 5–15)
CHLORIDE: 87 mmol/L — AB (ref 98–111)
CHLORIDE: 90 mmol/L — AB (ref 98–111)
CO2: 26 mmol/L (ref 22–32)
CO2: 25 mmol/L (ref 22–32)
CREATININE: 0.36 mg/dL — AB (ref 0.44–1.00)
CREATININE: 0.37 mg/dL — AB (ref 0.44–1.00)
Calcium: 7.5 mg/dL — ABNORMAL LOW (ref 8.9–10.3)
Calcium: 8.1 mg/dL — ABNORMAL LOW (ref 8.9–10.3)
GFR calc Af Amer: >60 mL/min (ref >60)
GFR calc Af Amer: >60 mL/min (ref >60)
GFR calc non Af Amer: >60 mL/min (ref >60)
GFR calc non Af Amer: >60 mL/min (ref >60)
Glucose, Bld: 91 mg/dL (ref 70–99)
Glucose, Bld: 108 mg/dL — ABNORMAL HIGH (ref 70–99)
Potassium: 2.3 mmol/L — CL (ref 3.5–5.1)
Potassium: 3.5 mmol/L (ref 3.5–5.1)
Sodium: 125 mmol/L — ABNORMAL LOW (ref 135–145)
Sodium: 127 mmol/L — ABNORMAL LOW (ref 135–145)

## 2018-04-16 LAB — POCT I-STAT EG7
Acid-Base Excess: 1 mmol/L (ref 0.0–2.0)
Bicarbonate: 24 mmol/L (ref 20.0–28.0)
Calcium, Ion: 1.06 mmol/L — ABNORMAL LOW (ref 1.15–1.40)
HEMATOCRIT: 35 % — AB (ref 36.0–46.0)
HEMOGLOBIN: 11.9 g/dL — AB (ref 12.0–15.0)
O2 Saturation: 79 %
PCO2 VEN: 32.1 mmHg — AB (ref 44.0–60.0)
POTASSIUM: 3.7 mmol/L (ref 3.5–5.1)
Sodium: 125 mmol/L — ABNORMAL LOW (ref 135–145)
TCO2: 25 mmol/L (ref 22–32)
pH, Ven: 7.481 — ABNORMAL HIGH (ref 7.250–7.430)
pO2, Ven: 40 mmHg (ref 32.0–45.0)

## 2018-04-16 LAB — SURGICAL PCR SCREEN
MRSA, PCR: NEGATIVE
Staphylococcus aureus: NEGATIVE

## 2018-04-16 SURGERY — REMOVAL PORT-A-CATH
Anesthesia: General

## 2018-04-16 MED ORDER — ONDANSETRON HCL 4 MG/2ML IJ SOLN
INTRAMUSCULAR | Status: DC | PRN
  Administered 2018-04-16: 4 mg via INTRAVENOUS

## 2018-04-16 MED ORDER — POTASSIUM CHLORIDE 10 MEQ/100ML IV SOLN
10.0000 meq | INTRAVENOUS | Status: AC
  Administered 2018-04-16 (×5): 10 meq via INTRAVENOUS
  Filled 2018-04-16 (×5): qty 100

## 2018-04-16 MED ORDER — POTASSIUM CHLORIDE CRYS ER 20 MEQ PO TBCR
40.0000 meq | EXTENDED_RELEASE_TABLET | Freq: Once | ORAL | Status: AC
  Administered 2018-04-16: 40 meq via ORAL
  Filled 2018-04-16: qty 2

## 2018-04-16 MED ORDER — OXYCODONE HCL 5 MG/5ML PO SOLN: 5.0000 mg | Freq: Once | ORAL | Status: DC | PRN

## 2018-04-16 MED ORDER — ONDANSETRON HCL 4 MG/2ML IJ SOLN
4.0000 mg | Freq: Once | INTRAMUSCULAR | Status: AC | PRN
  Administered 2018-04-16: 4 mg via INTRAVENOUS

## 2018-04-16 MED ORDER — ONDANSETRON HCL 4 MG/2ML IJ SOLN
INTRAMUSCULAR | Status: AC
  Filled 2018-04-16: qty 2

## 2018-04-16 MED ORDER — 0.9 % SODIUM CHLORIDE (POUR BTL) OPTIME
TOPICAL | Status: DC | PRN
  Administered 2018-04-16: 1000 mL

## 2018-04-16 MED ORDER — LIDOCAINE HCL (PF) 1 % IJ SOLN
INTRAMUSCULAR | Status: AC
  Filled 2018-04-16: qty 30

## 2018-04-16 MED ORDER — MEPERIDINE HCL 50 MG/ML IJ SOLN: 6.2500 mg | INTRAMUSCULAR | Status: DC | PRN

## 2018-04-16 MED ORDER — OXYCODONE HCL 5 MG PO TABS: 5.0000 mg | ORAL_TABLET | Freq: Once | ORAL | Status: DC | PRN

## 2018-04-16 MED ORDER — LACTATED RINGERS IV SOLN
INTRAVENOUS | Status: DC
  Administered 2018-04-16: 14:00:00 via INTRAVENOUS

## 2018-04-16 MED ORDER — SODIUM CHLORIDE 0.9 % IV SOLN
INTRAVENOUS | Status: DC
  Administered 2018-04-16 – 2018-04-17 (×2): via INTRAVENOUS

## 2018-04-16 MED ORDER — FENTANYL CITRATE (PF) 100 MCG/2ML IJ SOLN: 25.0000 ug | INTRAMUSCULAR | Status: DC | PRN

## 2018-04-16 MED ORDER — FENTANYL CITRATE (PF) 100 MCG/2ML IJ SOLN
INTRAMUSCULAR | Status: AC
  Filled 2018-04-16: qty 2

## 2018-04-16 MED ORDER — EPHEDRINE 5 MG/ML INJ
INTRAVENOUS | Status: AC
  Filled 2018-04-16: qty 10

## 2018-04-16 MED ORDER — MIDAZOLAM HCL 5 MG/5ML IJ SOLN
INTRAMUSCULAR | Status: DC | PRN
  Administered 2018-04-16: 2 mg via INTRAVENOUS

## 2018-04-16 MED ORDER — ACETAMINOPHEN 160 MG/5ML PO SOLN: 325.0000 mg | ORAL | Status: DC | PRN

## 2018-04-16 MED ORDER — FENTANYL CITRATE (PF) 100 MCG/2ML IJ SOLN
INTRAMUSCULAR | Status: DC | PRN
  Administered 2018-04-16: 50 ug via INTRAVENOUS

## 2018-04-16 MED ORDER — EPHEDRINE SULFATE-NACL 50-0.9 MG/10ML-% IV SOSY
PREFILLED_SYRINGE | INTRAVENOUS | Status: DC | PRN
  Administered 2018-04-16: 10 mg via INTRAVENOUS
  Administered 2018-04-16: 5 mg via INTRAVENOUS

## 2018-04-16 MED ORDER — MIDAZOLAM HCL 2 MG/2ML IJ SOLN
INTRAMUSCULAR | Status: AC
  Filled 2018-04-16: qty 2

## 2018-04-16 MED ORDER — PROPOFOL 10 MG/ML IV BOLUS
INTRAVENOUS | Status: DC | PRN
  Administered 2018-04-16: 150 mg via INTRAVENOUS

## 2018-04-16 MED ORDER — LIDOCAINE 2% (20 MG/ML) 5 ML SYRINGE
INTRAMUSCULAR | Status: DC | PRN
  Administered 2018-04-16: 80 mg via INTRAVENOUS

## 2018-04-16 MED ORDER — DEXAMETHASONE SODIUM PHOSPHATE 4 MG/ML IJ SOLN
INTRAMUSCULAR | Status: DC | PRN
  Administered 2018-04-16: 4 mg via INTRAVENOUS

## 2018-04-16 MED ORDER — ACETAMINOPHEN 325 MG PO TABS: 325.0000 mg | ORAL_TABLET | ORAL | Status: DC | PRN

## 2018-04-16 SURGICAL SUPPLY — 27 items
BENZOIN TINCTURE PRP APPL 2/3 (GAUZE/BANDAGES/DRESSINGS) ×2 IMPLANT
BLADE SURG 15 STRL LF DISP TIS (BLADE) ×1 IMPLANT
BLADE SURG 15 STRL SS (BLADE) ×2
CHLORAPREP W/TINT 26ML (MISCELLANEOUS) ×3 IMPLANT
CLOSURE STERI-STRIP 1/4X4 (GAUZE/BANDAGES/DRESSINGS) ×3 IMPLANT
CLOSURE WOUND 1/2 X4 (GAUZE/BANDAGES/DRESSINGS) ×1
CONT SPEC 4OZ CLIKSEAL STRL BL (MISCELLANEOUS) ×2 IMPLANT
COVER SURGICAL LIGHT HANDLE (MISCELLANEOUS) ×3 IMPLANT
DECANTER SPIKE VIAL GLASS SM (MISCELLANEOUS) ×3 IMPLANT
DRAPE LAPAROTOMY TRNSV 102X78 (DRAPE) ×3 IMPLANT
ELECT COATED BLADE 2.86 ST (ELECTRODE) IMPLANT
ELECT PENCIL ROCKER SW 15FT (MISCELLANEOUS) ×3 IMPLANT
ELECT REM PT RETURN 15FT ADLT (MISCELLANEOUS) ×3 IMPLANT
GAUZE 4X4 16PLY RFD (DISPOSABLE) ×3 IMPLANT
GAUZE SPONGE 4X4 12PLY STRL (GAUZE/BANDAGES/DRESSINGS) ×2 IMPLANT
GLOVE SURG SIGNA 7.5 PF LTX (GLOVE) ×3 IMPLANT
GOWN STRL REUS W/TWL XL LVL3 (GOWN DISPOSABLE) ×6 IMPLANT
KIT BASIN OR (CUSTOM PROCEDURE TRAY) ×3 IMPLANT
NEEDLE HYPO 22GX1.5 SAFETY (NEEDLE) ×3 IMPLANT
PACK BASIC VI WITH GOWN DISP (CUSTOM PROCEDURE TRAY) ×3 IMPLANT
STRIP CLOSURE SKIN 1/2X4 (GAUZE/BANDAGES/DRESSINGS) ×1 IMPLANT
SUT MNCRL AB 4-0 PS2 18 (SUTURE) ×3 IMPLANT
SUT VIC AB 3-0 SH 18 (SUTURE) ×3 IMPLANT
SYR BULB IRRIGATION 50ML (SYRINGE) IMPLANT
SYR CONTROL 10ML LL (SYRINGE) ×2 IMPLANT
TOWEL OR 17X26 10 PK STRL BLUE (TOWEL DISPOSABLE) ×3 IMPLANT
YANKAUER SUCT BULB TIP 10FT TU (MISCELLANEOUS) ×2 IMPLANT

## 2018-04-16 NOTE — Anesthesia Postprocedure Evaluation (Signed)
Anesthesia Post Note  Patient: Carolan Shiver  Procedure(s) Performed: REMOVAL PORT-A-CATH (N/A )     Patient location during evaluation: PACU Anesthesia Type: General Level of consciousness: awake and alert Pain management: pain level controlled Vital Signs Assessment: post-procedure vital signs reviewed and stable Respiratory status: spontaneous breathing, nonlabored ventilation, respiratory function stable and patient connected to nasal cannula oxygen Cardiovascular status: blood pressure returned to baseline and stable Postop Assessment: no apparent nausea or vomiting Anesthetic complications: no    Last Vitals:  Vitals:   04/16/18 1709 04/16/18 1816  BP: (!) 161/98 (!) 152/86  Pulse: 62 65  Resp: 17 20  Temp: (!) 36.4 C 36.8 C  SpO2: 93% 95%    Last Pain:  Vitals:   04/16/18 1816  TempSrc: Axillary  PainSc:                  Gil Ingwersen

## 2018-04-16 NOTE — Consult Note (Signed)
[facility] Surgery Consult Note  Haley [name] 06-04-1951  [id number].    Requesting MD: Lonny Prude Chief Complaint/Reason for Consult: Port removal HPI:  Patient is a 67 year old female with metastatic small cell lung cancer who presented to [facility] ED with acute onset abdominal pain, altered mental status, fever and weakness 04/12/18. 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 Patient found to have cholangitis due to biliary obstruction and another stent was placed via ERCP 8/20. Blood cultures grew out klebsiella and enterobacter. We are consulted for port removal in setting of bacteremia. Patient has never been bacteremic since port placement and port itself is not infected. Port was placed by interventional radiology 11/10/17. 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 Last chemotherapy was 2 weeks prior to admission. Patient's oncologist is Dr. Irene Limbo.   She currently denies fever, chills, abdominal pain, chest pain, SOB, tingling or numbness in extremities. She does report some chronic back pain and chronic nausea.   ROS: Review of Systems  Constitutional: Negative for chills and fever.  Respiratory: Negative for shortness of breath and wheezing.   Cardiovascular: Negative for chest pain and palpitations.  Gastrointestinal: Positive for nausea. Negative for abdominal pain, diarrhea and vomiting.  Genitourinary: Negative for dysuria, frequency and urgency.  Musculoskeletal: Positive for back pain.  Neurological: Negative for tingling, sensory change and focal weakness.  All other systems reviewed and are negative.   Family History  Problem Relation Age of Onset  . Diabetes Mother   . Hypertension Mother   . Diabetes Sister   . Hypertension Sister   . Hyperlipidemia Sister     Past Medical History:  Diagnosis Date  . Allergy   . Back pain   . Osteopenia   . Small cell lung cancer ([place]) 10/17/2017   Extended stage with  metastases to liver and bone  . Tubular adenoma of colon 10/26/2014    Past Surgical History:  Procedure Laterality Date  . BILIARY STENT PLACEMENT N/A 04/14/2018   Procedure: BILIARY STENT PLACEMENT;  Surgeon: [name], MD;  Location: [facility] ENDOSCOPY;  Service: Gastroenterology;  Laterality: N/A;  . COLONOSCOPY    . COLONOSCOPY WITH PROPOFOL N/A 10/25/2014   Procedure: COLONOSCOPY WITH PROPOFOL;  Surgeon: Garlan Fair, MD;  Location: [facility] ENDOSCOPY;  Service: Endoscopy;  Laterality: N/A;  . ERCP N/A 10/15/2017   Procedure: ENDOSCOPIC RETROGRADE CHOLANGIOPANCREATOGRAPHY (ERCP);  Surgeon: Clarene Essex, MD;  Location: [facility];  Service: Endoscopy;  Laterality: N/A;  . ERCP N/A 04/14/2018   Procedure: ENDOSCOPIC RETROGRADE CHOLANGIOPANCREATOGRAPHY (ERCP);  Surgeon: [name], MD;  Location: [facility];  Service: Gastroenterology;  Laterality: N/A;  with stent change and Balloon Sweep  . IR FLUORO GUIDE PORT INSERTION RIGHT  11/10/2017  . IR RADIOLOGIST EVAL & MGMT  10/02/2017  . IR US GUIDE VASC ACCESS RIGHT  11/10/2017    Social History:  reports that she quit smoking about 6 months ago. Her smoking use included cigarettes. She has a 40.00 pack-year smoking history. She has never used smokeless tobacco. She reports that she drinks alcohol. She reports that she does not use drugs.  Allergies: No Known Allergies  Medications Prior to Admission  Medication Sig Dispense Refill  . calcium carbonate (OS-CAL - DOSED IN MG OF ELEMENTAL CALCIUM) 1250 (500 Ca) MG tablet Take 1 tablet by mouth daily.     . hydrocortisone (CORTEF) 10 MG tablet Take 2 tablets (20 mg total) by mouth daily with  breakfast. And 58m po daily with lunch. (Patient taking differently: Take 10-20 mg by mouth See admin instructions. 20 mg every morning And 166mwith lunch.) 90 tablet 1  . lidocaine-prilocaine (EMLA) cream Apply to affected area once 30 g 3  . LORazepam (ATIVAN) 0.5 MG tablet Take 1 tablet (0.5 mg total) by  mouth every 6 (six) hours as needed (Nausea or vomiting). 30 tablet 0  . magic mouthwash w/lidocaine SOLN Take 10 mLs by mouth 4 (four) times daily as needed for mouth pain. Rinse and spit or swallow 240 mL 2  . metoCLOPramide (REGLAN) 10 MG tablet Take 1 tablet (10 mg total) by mouth every 8 (eight) hours as needed for nausea. 45 tablet 1  . Multiple Vitamins-Minerals (MULTIVITAMIN WITH MINERALS) tablet Take 1 tablet by mouth daily.    . ondansetron (ZOFRAN) 4 MG tablet Take 1 tablet (4 mg total) by mouth every 8 (eight) hours as needed for nausea or vomiting. 20 tablet 0  . oxyCODONE (OXY IR/ROXICODONE) 5 MG immediate release tablet Take 1-2 tablets (5-10 mg total) by mouth every 4 (four) hours as needed for moderate pain or severe pain. 60 tablet 0  . polyethylene glycol (MIRALAX) packet Take 17 g by mouth daily as needed for moderate constipation. 30 each 0  . prochlorperazine (COMPAZINE) 10 MG tablet Take 1 tablet (10 mg total) by mouth every 6 (six) hours as needed (Nausea or vomiting). 30 tablet 1  . senna-docusate (SENNA S) 8.6-50 MG tablet Take 2 tablets by mouth at bedtime. (Patient taking differently: Take 2 tablets by mouth at bedtime as needed for mild constipation or moderate constipation. ) 60 tablet 1  . sucralfate (CARAFATE) 1 GM/10ML suspension Take 10 mLs (1 g total) by mouth 3 (three) times daily. With meals (Patient not taking: Reported on 04/13/2018) 840 mL 2    Blood pressure 116/75, pulse (!) 52, temperature 98.4 F (36.9 C), temperature source Oral, resp. rate 18, height 5' 3"  (1.6 m), weight 51.9 kg, SpO2 93 %. Physical Exam: Physical Exam  Constitutional: She is oriented to person, place, and time. She appears well-developed. She is cooperative.  Non-toxic appearance. No distress.  HENT:  Head: Normocephalic and atraumatic.  Right Ear: External ear normal.  Left Ear: External ear normal.  Nose: Nose normal.  Mouth/Throat: Oropharynx is clear and moist and mucous  membranes are normal.  Eyes: Conjunctivae, EOM and lids are normal. No scleral icterus.  Pupils equal and round   Cardiovascular: Normal rate and regular rhythm.  Pulses:      Radial pulses are 2+ on the right side, and 2+ on the left side.       Dorsalis pedis pulses are 2+ on the right side, and 2+ on the left side.  Pulmonary/Chest: Effort normal and breath sounds normal.  Abdominal: Soft. Bowel sounds are normal. She exhibits no distension. There is no hepatosplenomegaly. There is no tenderness. There is no rigidity, no rebound and no guarding. No hernia.  Musculoskeletal:  ROM grossly intact in bilateral upper and lower extremities  Neurological: She is alert and oriented to person, place, and time. No sensory deficit.  Skin: Skin is warm, dry and intact. She is not diaphoretic. No pallor.  Psychiatric: She has a normal mood and affect. Her speech is normal and behavior is normal.    Results for orders placed or performed during the hospital encounter of 04/12/18 (from the past 48 hour(s))  Basic metabolic panel     Status: Abnormal  Collection Time: 04/15/18  5:00 AM  Result Value Ref Range   Sodium 129 (L) 135 - 145 mmol/L    Comment: DELTA CHECK NOTED NO VISIBLE HEMOLYSIS    Potassium 3.7 3.5 - 5.1 mmol/L   Chloride 97 (L) 98 - 111 mmol/L   CO2 22 22 - 32 mmol/L   Glucose, Bld 109 (H) 70 - 99 mg/dL   BUN 6 (L) 8 - 23 mg/dL   Creatinine, Ser 0.36 (L) 0.44 - 1.00 mg/dL   Calcium 7.5 (L) 8.9 - 10.3 mg/dL   GFR calc non Af Amer >60 >60 mL/min   GFR calc Af Amer >60 >60 mL/min    Comment: (NOTE) The eGFR has been calculated using the CKD EPI equation. This calculation has not been validated in all clinical situations. eGFR's persistently <60 mL/min signify possible Chronic Kidney Disease.    Anion gap 10 5 - 15    Comment: Performed at [facility], [place] [address] [place], [name] [address]  Magnesium     Status: None   Collection Time: 04/15/18  5:00  AM  Result Value Ref Range   Magnesium 1.8 1.7 - 2.4 mg/dL    Comment: Performed at [facility], [place] [address] [place], [place] [address]  Hepatic function panel     Status: Abnormal   Collection Time: 04/15/18  5:00 AM  Result Value Ref Range   Total Protein 5.8 (L) 6.5 - 8.1 g/dL   Albumin 2.6 (L) 3.5 - 5.0 g/dL   AST 34 15 - 41 U/L   ALT 78 (H) 0 - 44 U/L   Alkaline Phosphatase 298 (H) 38 - 126 U/L   Total Bilirubin 1.0 0.3 - 1.2 mg/dL   Bilirubin, Direct 0.4 (H) 0.0 - 0.2 mg/dL   Indirect Bilirubin 0.6 0.3 - 0.9 mg/dL    Comment: Performed at [facility] [place] [address] [place], [place] [address]  CBC with Differential/Platelet     Status: Abnormal   Collection Time: 04/15/18  7:50 AM  Result Value Ref Range   WBC 10.2 4.0 - 10.5 K/uL   RBC 3.45 (L) 3.87 - 5.11 MIL/uL   Hemoglobin 10.0 (L) 12.0 - 15.0 g/dL   HCT 30.0 (L) 36.0 - 46.0 %   MCV 87.0 78.0 - 100.0 fL   MCH 29.0 26.0 - 34.0 pg   MCHC 33.3 30.0 - 36.0 g/dL   RDW 15.9 (H) 11.5 - 15.5 %   Platelets 282 150 - 400 K/uL   Neutrophils Relative % 84 %   Neutro Abs 8.6 (H) 1.7 - 7.7 K/uL   Lymphocytes Relative 10 %   Lymphs Abs 1.1 0.7 - 4.0 K/uL   Monocytes Relative 6 %   Monocytes Absolute 0.6 0.1 - 1.0 K/uL   Eosinophils Relative 0 %   Eosinophils Absolute 0.0 0.0 - 0.7 K/uL   Basophils Relative 0 %   Basophils Absolute 0.0 0.0 - 0.1 K/uL    Comment: Performed at [facility], [place] [address] [place], [place] [address]  Basic metabolic panel     Status: Abnormal   Collection Time: 04/16/18  3:50 AM  Result Value Ref Range   Sodium 125 (L) 135 - 145 mmol/L   Potassium 2.3 (LL) 3.5 - 5.1 mmol/L    Comment: DELTA CHECK NOTED REPEATED TO VERIFY CRITICAL RESULT CALLED TO, READ BACK BY AND VERIFIED WITHBettina Gavia RN [id number] 04/16/18 A NAVARRO    Chloride 87 (  L) 98 - 111 mmol/L   CO2 26 22 - 32 mmol/L   Glucose, Bld 91 70 - 99 mg/dL   BUN <5 (L) 8 - 23  mg/dL   Creatinine, Ser 0.36 (L) 0.44 - 1.00 mg/dL   Calcium 7.5 (L) 8.9 - 10.3 mg/dL   GFR calc non Af Amer >60 >60 mL/min   GFR calc Af Amer >60 >60 mL/min    Comment: (NOTE) The eGFR has been calculated using the CKD EPI equation. This calculation has not been validated in all clinical situations. eGFR's persistently <60 mL/min signify possible Chronic Kidney Disease.    Anion gap 12 5 - 15    Comment: Performed at [facility], [place] [address] [place], [place] [address]   Dg Ercp  Result Date: 04/15/2018 CLINICAL DATA:  Metastatic small cell lung carcinoma with history of biliary obstruction and previous metallic stent placement in the common bile duct. Imaging suggestion of potential relative obstruction of the bile duct stent. EXAM: ERCP TECHNIQUE: Multiple spot images obtained with the fluoroscopic device and submitted for interpretation post-procedure. COMPARISON:  CT of the abdomen on 04/12/2018 FINDINGS: C-arm imaging during the endoscopic procedure demonstrates balloon dilatation of the indwelling metallic common bile duct stent. During the procedure there appears to be placement of an overlapping metallic stent. Occlusion cholangiogram shows opacification of the biliary tree proximal to the stented segment. IMPRESSION: Imaging obtained during balloon dilatation of an indwelling metallic common bile duct stent and placement of new overlapping metallic biliary stent. These images were submitted for radiologic interpretation only. Please see the procedural report for the amount of contrast and the fluoroscopy time utilized. Electronically Signed   By: Aletta Edouard M.D.   On: 04/15/2018 07:56      Assessment/Plan Metastatic small cell lung cancer Malignant obstructive jaundice Pathologic L2 fracture  Bacteremia - blood cxs from 8/18 grew out klebsiella and enterobacter - related to biliary obstruction and subsequent cholangitis - s/p ERCP biliary stent  8/20 - ID involved and guiding abx therapy, recommending port removal and insertion of PICC after 2 days line free - have reached out to Dr. Grier Mitts office to ensure port removal does not impede any treatment plans for this patient  - will tentatively plan for port removal this afternoon   FEN: NPO, IVF VTE: SCDs ID: [email or web address] 8/19>>  Brigid Re, [facility] Surgery 04/16/2018, 7:29 AM Pager: [phone] Consults: [phone] Mon-Fri 7:00 am-4:30 pm Sat-Sun 7:00 am-11:30 am

## 2018-04-16 NOTE — Anesthesia Procedure Notes (Signed)
Performed by: Alexi Geibel A, CRNA       

## 2018-04-16 NOTE — Progress Notes (Signed)
[facility] Infusion Coordinator will follow pt with ID team for possible home IV ABX at DC to home.   Haley [name] 04/16/2018, 9:12 AM

## 2018-04-16 NOTE — Anesthesia Preprocedure Evaluation (Addendum)
Anesthesia Evaluation  Patient identified by MRN, date of birth, ID band Patient awake    Reviewed: Allergy & Precautions, NPO status , Patient's Chart, lab work & pertinent test results  Airway Mallampati: II  TM Distance: >3 FB     Dental   Pulmonary pneumonia, former smoker,    breath sounds clear to auscultation       Cardiovascular negative cardio ROS   Rhythm:Regular Rate:Normal     Neuro/Psych    GI/Hepatic negative GI ROS, Neg liver ROS,   Endo/Other  negative endocrine ROS  Renal/GU negative Renal ROS     Musculoskeletal   Abdominal   Peds  Hematology   Anesthesia Other Findings   Reproductive/Obstetrics                             Anesthesia Physical  Anesthesia Plan  ASA: IV and emergent  Anesthesia Plan: General   Post-op Pain Management:    Induction: Intravenous  PONV Risk Score and Plan: Treatment may vary due to age or medical condition  Airway Management Planned: LMA and Oral ETT  Additional Equipment:   Intra-op Plan:   Post-operative Plan: Extubation in OR  Informed Consent: I have reviewed the patients History and Physical, chart, labs and discussed the procedure including the risks, benefits and alternatives for the proposed anesthesia with the patient or authorized representative who has indicated his/her understanding and acceptance.   Dental advisory given  Plan Discussed with: CRNA, Anesthesiologist and Surgeon  Anesthesia Plan Comments: ( )       Anesthesia Quick Evaluation

## 2018-04-16 NOTE — Interval H&P Note (Signed)
History and Physical Interval Note:  04/16/2018 2:17 PM  Haley [name]  has presented today for surgery, with the diagnosis of bacteremia  The various methods of treatment have been discussed with the patient and family. After consideration of risks, benefits and other options for treatment, the patient has consented to  Procedure(s): REMOVAL PORT-A-CATH (N/A) as a surgical intervention .  The patient's history has been reviewed, patient examined, no change in status, stable for surgery.  I have reviewed the patient's chart and labs.  Questions were answered to the patient's satisfaction.     Sarann Tregre Rich Brave

## 2018-04-16 NOTE — Progress Notes (Signed)
PROGRESS NOTE    Haley [name]  JKK:[id number] DOB: May 12, 1951 DOA: 04/12/2018 PCP: Alycia Rossetti, MD   Brief Narrative: Haley [name] is a 67 y.o. female with a history of small cell carcinoma (neuroendocrine) with metastasis. She presented with altered mental status and weakness and found to have sepsis. Improved with empiric antibiotics.   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 Physiology improved. -Continue antibiotics  Klebsiella pneumoniae bacteremia Biliary source. Complicated by patient's Portacath -Infectious disease recommendations: remove port -Consult general surgery for port removal -Repeat blood cultures (8/21) pending -Continue ceftriaxone  Malignant obstructive jaundice Patient with with liver metastasis and pancreatic head metastasis. Recently completed a round of chemotherapy. S/p ERCP on 8/20 with metal biliary stent placement  Hyponatremia/hypokalemia/hypomagnesemia/hypophosphatemia Likely complicated by history of adrenal insufficiency but potassium low. Sodium and potassium low today. 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 -Continue Solu-cortef at current dose  Nausea -Zofran prn   DVT prophylaxis: Heparin Code Status:   Code Status: Full Code Family  Communication: None at bedside Disposition Plan: Discharge pending management of infection   Consultants:   Infectious disease  General surgery  Procedures:   None  Antimicrobials:  Vancomycin  Flagyl  Cefepime  Ceftriaxone    Subjective: Some nausea today.  Objective: Vitals:   04/15/18 2000 04/15/18 2200 04/16/18 0500 04/16/18 0541  BP: (!) 159/77 (!) 169/75  116/75  Pulse: (!) 48 (!) 47  (!) 52  Resp: 14 14  18   Temp: 98.4 F (36.9 C)   98.4 F (36.9 C)  TempSrc: Oral   Oral  SpO2: 97% 99%  93%  Weight:   51.9 kg   Height:        Intake/Output Summary (Last 24 hours) at 04/16/2018 0828 Last data filed at 04/16/2018 7169 Gross per 24 hour  Intake 2202.46 ml  Output 800 ml  Net 1402.46 ml   Filed Weights   04/14/18 1238 04/15/18 0530 04/16/18 0500  Weight: 56.4 kg 56.4 kg 51.9 kg    Examination:  General exam: Appears calm and comfortable Respiratory system: Clear to auscultation. Respiratory effort normal. Cardiovascular system: S1 & S2 heard, RRR.  2/6 systolic murmur Gastrointestinal system: Abdomen is nondistended, soft and mild tenderness periumbilically. No organomegaly or masses felt. Normal bowel sounds heard. Central nervous system: Alert and oriented. No focal neurological deficits. Extremities: No edema. No calf tenderness Skin: No cyanosis. No rashes Psychiatry: Judgement and insight appear normal. Mood & affect appropriate.     Data Reviewed: I have personally reviewed following labs and imaging studies  CBC: Recent Labs  Lab 04/12/18 2151 04/13/18 0307 04/14/18 0410 04/15/18 0750  WBC 13.8* 15.6* 16.2* 10.2  NEUTROABS  --   --  14.9* 8.6*  HGB 12.6 11.5* 9.9* 10.0*  HCT 36.7 33.7* 29.0* 30.0*  MCV 86.4 85.8 86.1 87.0  PLT  436* 318 324 657   Basic Metabolic Panel: Recent Labs  Lab 04/13/18 0324 04/13/18 0631 04/13/18 0640 04/13/18 1940 04/14/18 0410 04/15/18 0500 04/16/18 0350  NA 129* 131*  --  132* 136 129*  125*  K 2.6* 2.6*  --  4.3 3.3* 3.7 2.3*  CL 97* 97*  --  99 104 97* 87*  CO2 22 21*  --  22 24 22 26   GLUCOSE 134* 116*  --  181* 129* 109* 91  BUN 7* 7*  --  8 8 6* <5*  CREATININE <0.30* 0.33*  --  0.33* <0.30* 0.36* 0.36*  CALCIUM 7.8* 7.4*  --  7.0* 7.0* 7.5* 7.5*  MG  --  1.4*  --   --   --  1.8  --   PHOS 1.9* 2.6 1.8*  --   --   --   --    GFR: Estimated Creatinine Clearance: 57.4 mL/min (A) (by C-G formula based on SCr of 0.36 mg/dL (L)). Liver Function Tests: Recent Labs  Lab 04/12/18 2151 04/13/18 0324 04/13/18 0631 04/14/18 0410 04/15/18 0500  AST 282*  --   --  77* 34  ALT 204*  --   --  114* 78*  ALKPHOS 519*  --   --  364* 298*  BILITOT 3.6*  --   --  1.2 1.0  PROT 7.0  --   --  5.6* 5.8*  ALBUMIN 3.1* 2.7* 2.4* 2.5* 2.6*   Recent Labs  Lab 04/12/18 2151  LIPASE 26   Recent Labs  Lab 04/12/18 2200  AMMONIA 58*   Coagulation Profile: Recent Labs  Lab 04/14/18 0410  INR 1.29   Cardiac Enzymes: No results for input(s): CKTOTAL, CKMB, CKMBINDEX, TROPONINI in the last 168 hours. BNP (last 3 results) No results for input(s): PROBNP in the last 8760 hours. HbA1C: No results for input(s): HGBA1C in the last 72 hours. CBG: No results for input(s): GLUCAP in the last 168 hours. Lipid Profile: No results for input(s): CHOL, HDL, LDLCALC, TRIG, CHOLHDL, LDLDIRECT in the last 72 hours. Thyroid Function Tests: No results for input(s): TSH, T4TOTAL, FREET4, T3FREE, THYROIDAB in the last 72 hours. Anemia Panel: No results for input(s): VITAMINB12, FOLATE, FERRITIN, TIBC, IRON, RETICCTPCT in the last 72 hours. Sepsis Labs: Recent Labs  Lab 04/12/18 2203 04/12/18 2359 04/13/18 0340 04/13/18 0631  LATICACIDVEN 2.48* 3.14* 2.0* 1.4    Recent Results (from the past 240 hour(s))  Blood Culture (routine x 2)     Status: Abnormal   Collection Time: 04/12/18  9:27 PM  Result Value Ref Range Status   Specimen Description   Final    BLOOD LEFT  ANTECUBITAL Performed at [facility], [place] [address], [place], [place] [address]    Special Requests   Final    BOTTLES DRAWN AEROBIC AND ANAEROBIC Blood Culture adequate volume Performed at [facility] [address] [place], [place] [address]    Culture  Setup Time   Final    GRAM NEGATIVE RODS IN BOTH AEROBIC AND ANAEROBIC BOTTLES CRITICAL RESULT CALLED TO, READ BACK BY AND VERIFIED WITH: Guadlupe Spanish PharmD 11:35 04/13/18 ([email or web address]) Performed at [facility], [place] [address], [place], [place] [address]    Culture KLEBSIELLA PNEUMONIAE (A)  Final   Report Status 04/15/2018 FINAL  Final   Organism ID, Bacteria KLEBSIELLA PNEUMONIAE  Final      Susceptibility   Klebsiella pneumoniae - MIC*    AMPICILLIN >=32 RESISTANT  Resistant     CEFAZOLIN <=4 SENSITIVE Sensitive     CEFEPIME <=1 SENSITIVE Sensitive     CEFTAZIDIME <=1 SENSITIVE Sensitive     CEFTRIAXONE <=1 SENSITIVE Sensitive     CIPROFLOXACIN <=0.25 SENSITIVE Sensitive     GENTAMICIN <=1 SENSITIVE Sensitive     IMIPENEM <=0.25 SENSITIVE Sensitive     TRIMETH/SULFA <=20 SENSITIVE Sensitive     AMPICILLIN/SULBACTAM 8 SENSITIVE Sensitive     PIP/TAZO <=4 SENSITIVE Sensitive     Extended ESBL NEGATIVE Sensitive     * KLEBSIELLA PNEUMONIAE  Blood Culture ID Panel (Reflexed)     Status: Abnormal   Collection Time: 04/12/18  9:27 PM  Result Value Ref Range Status   Enterococcus species NOT DETECTED NOT DETECTED Final   Listeria monocytogenes NOT DETECTED NOT DETECTED Final   Staphylococcus species NOT DETECTED NOT DETECTED Final   Staphylococcus aureus NOT DETECTED NOT DETECTED Final   Streptococcus species NOT DETECTED NOT DETECTED Final   Streptococcus agalactiae NOT DETECTED NOT DETECTED Final   Streptococcus pneumoniae NOT DETECTED NOT DETECTED Final   Streptococcus pyogenes NOT DETECTED NOT DETECTED Final   Acinetobacter baumannii NOT DETECTED NOT DETECTED Final    Enterobacteriaceae species DETECTED (A) NOT DETECTED Final    Comment: Enterobacteriaceae represent a large family of gram-negative bacteria, not a single organism. CRITICAL RESULT CALLED TO, READ BACK BY AND VERIFIED WITH: Guadlupe Spanish PharmD 11:35 04/13/18 ([email or web address])    Enterobacter cloacae complex NOT DETECTED NOT DETECTED Final   Escherichia coli NOT DETECTED NOT DETECTED Final   Klebsiella oxytoca NOT DETECTED NOT DETECTED Final   Klebsiella pneumoniae DETECTED (A) NOT DETECTED Final    Comment: CRITICAL RESULT CALLED TO, READ BACK BY AND VERIFIED WITH: Guadlupe Spanish PharmD 11:35 04/13/18 ([email or web address])    Proteus species NOT DETECTED NOT DETECTED Final   Serratia marcescens NOT DETECTED NOT DETECTED Final   Carbapenem resistance NOT DETECTED NOT DETECTED Final   Haemophilus influenzae NOT DETECTED NOT DETECTED Final   Neisseria meningitidis NOT DETECTED NOT DETECTED Final   Pseudomonas aeruginosa NOT DETECTED NOT DETECTED Final   Candida albicans NOT DETECTED NOT DETECTED Final   Candida glabrata NOT DETECTED NOT DETECTED Final   Candida krusei NOT DETECTED NOT DETECTED Final   Candida parapsilosis NOT DETECTED NOT DETECTED Final   Candida tropicalis NOT DETECTED NOT DETECTED Final    Comment: Performed at [facility], [place] [address] [place], [place] [address]  Blood Culture (routine x 2)     Status: Abnormal   Collection Time: 04/12/18 10:17 PM  Result Value Ref Range Status   Specimen Description   Final    BLOOD LEFT ANTECUBITAL Performed at [facility] [address], [place], [place] [address]    Special Requests   Final    BOTTLES DRAWN AEROBIC AND ANAEROBIC Blood Culture adequate volume Performed at [facility] [address] [place], [place] [address]    Culture  Setup Time   Final    GRAM NEGATIVE RODS IN BOTH AEROBIC AND ANAEROBIC BOTTLES CRITICAL VALUE NOTED.  VALUE IS CONSISTENT WITH PREVIOUSLY REPORTED AND CALLED  VALUE.    Culture (A)  Final    KLEBSIELLA PNEUMONIAE SUSCEPTIBILITIES PERFORMED ON PREVIOUS CULTURE WITHIN THE LAST 5 DAYS. Performed at [facility], [place] [address], [place], [place] [address]    Report Status 04/15/2018 FINAL  Final  MRSA PCR Screening     Status: None   Collection Time: 04/13/18  4:43 AM  Result Value Ref Range Status   MRSA by PCR NEGATIVE NEGATIVE Final    Comment:        The GeneXpert MRSA Assay (FDA approved for NASAL specimens only), is one component of a comprehensive MRSA colonization surveillance program. It is not intended to diagnose MRSA infection nor to guide or monitor treatment for MRSA infections. Performed at [facility], [place] [address], [place], [place] [address]   Culture, Urine     Status: None   Collection Time: 04/13/18 12:49 PM  Result Value Ref Range Status   Specimen Description   Final    URINE, CLEAN CATCH Performed at [facility], [place], [place] [address], [place], [place] [address]    Special Requests   Final    Immunocompromised Performed at [facility], [place] [address], [place], [place] [address]    Culture   Final    NO GROWTH Performed at [facility], [place] [address] [place], [place] [address]    Report Status 04/14/2018 FINAL  Final         Radiology Studies: Dg Ercp  Result Date: 04/15/2018 CLINICAL DATA:  Metastatic small cell lung carcinoma with history of biliary obstruction and previous metallic stent placement in the common bile duct. Imaging suggestion of potential relative obstruction of the bile duct stent. EXAM: ERCP TECHNIQUE: Multiple spot images obtained with the fluoroscopic device and submitted for interpretation post-procedure. COMPARISON:  CT of the abdomen on 04/12/2018 FINDINGS: C-arm imaging during the endoscopic procedure demonstrates balloon dilatation of the indwelling metallic common bile duct stent. During the procedure there  appears to be placement of an overlapping metallic stent. Occlusion cholangiogram shows opacification of the biliary tree proximal to the stented segment. IMPRESSION: Imaging obtained during balloon dilatation of an indwelling metallic common bile duct stent and placement of new overlapping metallic biliary stent. These images were submitted for radiologic interpretation only. Please see the procedural report for the amount of contrast and the fluoroscopy time utilized. Electronically Signed   By: Aletta Edouard M.D.   On: 04/15/2018 07:56        Scheduled Meds: . calcium carbonate  1 tablet Oral Q breakfast  . Chlorhexidine Gluconate Cloth  6 each Topical Daily  . feeding supplement  1 Container Oral TID BM  . hydrocortisone sod succinate (SOLU-CORTEF) inj  50 mg Intravenous Q12H  . mouth rinse  15 mL Mouth Rinse BID  . multivitamin with minerals  1 tablet Oral Daily   Continuous Infusions: . sodium chloride 10 mL/hr at 04/13/18 0800  . sodium chloride 250 mL (04/14/18 2317)  . sodium chloride 75 mL/hr at 04/16/18 0816  . cefTRIAXone (ROCEPHIN)  IV Stopped (04/15/18 1428)  . famotidine (PEPCID) IV Stopped (04/15/18 2130)  . lactated ringers Stopped (04/16/18 0533)  . potassium chloride 10 mEq (04/16/18 0814)     LOS: 3 days     Cordelia Poche, MD [facility] 04/16/2018, 8:28 AM Pager: [phone]  If 7PM-7AM, please contact night-coverage [email or web address] 04/16/2018, 8:28 AM

## 2018-04-16 NOTE — Transfer of Care (Signed)
Immediate Anesthesia Transfer of Care Note  Patient: Haley [name]  Procedure(s) Performed: REMOVAL PORT-A-CATH (N/A )  Patient Location: PACU  Anesthesia Type:General  Level of Consciousness: awake, alert , oriented and patient cooperative  Airway & Oxygen Therapy: Patient Spontanous Breathing and Patient connected to face mask oxygen  Post-op Assessment: Report given to RN, Post -op Vital signs reviewed and stable and Patient moving all extremities  Post vital signs: Reviewed and stable  Last Vitals:  Vitals Value Taken Time  BP 158/78 04/16/2018  3:25 PM  Temp    Pulse 58 04/16/2018  3:25 PM  Resp 13 04/16/2018  3:25 PM  SpO2 100 % 04/16/2018  3:25 PM  Vitals shown include unvalidated device data.  Last Pain:  Vitals:   04/16/18 0733  TempSrc:   PainSc: 3       Patients Stated Pain Goal: 2 (25/85/27 7824)  Complications: No apparent anesthesia complications

## 2018-04-16 NOTE — Op Note (Signed)
Operative Note  [name]  [id number]  [id number]  04/16/2018   Surgeon: Vikki Ports A ConnorMD  Assistant: none  Procedure performed: remocal of right chest port  Preop diagnosis: klebsiella bacteremia secondary to cholangitis, port removal recommended by Infectious Disease Post-op diagnosis/intraop findings: port  Specimens: port for culture Retained items: no EBL: minimal cc Complications: none  Description of procedure: After obtaining informed consent the patient was taken to the operating room and placed supine on operating room table wheregeneral LMA anesthesia was initiated, preoperative antibiotics were administered, SCDs applied, and a formal timeout was performed. The chest and neck were prepped and draped in the usual sterile fashion. An incision was made over the port and cautery used to dissect the capsule from the port. The port and tubing were removed easily. There were no sutures present. Pressure was held on the catheter entry site on the right IJ for several minutes. The capsule  Was ablated with cautery. The wound was closed with a deep dermal 3-0 vicryl and subcuticular monocryl. Steri strips and a dry gauze dressing were applied.   The patient was then awakened, extubated and taken to PACU in stable condition.   All counts were correct at the completion of the case.

## 2018-04-16 NOTE — Anesthesia Procedure Notes (Signed)
Procedure Name: LMA Insertion Date/Time: 04/16/2018 2:47 PM Performed by: Victoriano Lain, CRNA Pre-anesthesia Checklist: Patient identified, Emergency Drugs available, Suction available, Patient being monitored and Timeout performed Patient Re-evaluated:Patient Re-evaluated prior to induction Oxygen Delivery Method: Circle system utilized Preoxygenation: Pre-oxygenation with 100% oxygen Induction Type: IV induction LMA: LMA inserted LMA Size: 4.0 Number of attempts: 1 Placement Confirmation: positive ETCO2 and breath sounds checked- equal and bilateral Tube secured with: Tape Dental Injury: Teeth and Oropharynx as per pre-operative assessment

## 2018-04-16 NOTE — H&P (View-Only) (Signed)
[facility] Surgery Consult Note  CREE NAPOLI [birth year]  [id number].    Requesting MD: Lonny Prude Chief Complaint/Reason for Consult: Port removal HPI:  Patient is a 67 year old female with metastatic small cell lung cancer who presented to [facility] ED with acute onset abdominal pain, altered mental status, fever and weakness 04/12/18. 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 Patient found to have cholangitis due to biliary obstruction and another stent was placed via ERCP 8/20. Blood cultures grew out klebsiella and enterobacter. We are consulted for port removal in setting of bacteremia. Patient has never been bacteremic since port placement and port itself is not infected. Port was placed by interventional radiology 11/10/17. 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 Last chemotherapy was 2 weeks prior to admission. Patient's oncologist is Dr. Irene Limbo.   She currently denies fever, chills, abdominal pain, chest pain, SOB, tingling or numbness in extremities. She does report some chronic back pain and chronic nausea.   ROS: Review of Systems  Constitutional: Negative for chills and fever.  Respiratory: Negative for shortness of breath and wheezing.   Cardiovascular: Negative for chest pain and palpitations.  Gastrointestinal: Positive for nausea. Negative for abdominal pain, diarrhea and vomiting.  Genitourinary: Negative for dysuria, frequency and urgency.  Musculoskeletal: Positive for back pain.  Neurological: Negative for tingling, sensory change and focal weakness.  All other systems reviewed and are negative.   Family History  Problem Relation Age of Onset  . Diabetes Mother   . Hypertension Mother   . Diabetes Sister   . Hypertension Sister   . Hyperlipidemia Sister     Past Medical History:  Diagnosis Date  . Allergy   . Back pain   . Osteopenia   . Small cell lung cancer ([place]) 10/17/2017   Extended stage with  metastases to liver and bone  . Tubular adenoma of colon 10/26/2014    Past Surgical History:  Procedure Laterality Date  . BILIARY STENT PLACEMENT N/A 04/14/2018   Procedure: BILIARY STENT PLACEMENT;  Surgeon: [name], MD;  Location: [facility] ENDOSCOPY;  Service: Gastroenterology;  Laterality: N/A;  . COLONOSCOPY    . COLONOSCOPY WITH PROPOFOL N/A 10/25/2014   Procedure: COLONOSCOPY WITH PROPOFOL;  Surgeon: Garlan Fair, MD;  Location: [facility] ENDOSCOPY;  Service: Endoscopy;  Laterality: N/A;  . ERCP N/A 10/15/2017   Procedure: ENDOSCOPIC RETROGRADE CHOLANGIOPANCREATOGRAPHY (ERCP);  Surgeon: Clarene Essex, MD;  Location: [facility];  Service: Endoscopy;  Laterality: N/A;  . ERCP N/A 04/14/2018   Procedure: ENDOSCOPIC RETROGRADE CHOLANGIOPANCREATOGRAPHY (ERCP);  Surgeon: [name], MD;  Location: [facility];  Service: Gastroenterology;  Laterality: N/A;  with stent change and Balloon Sweep  . IR FLUORO GUIDE PORT INSERTION RIGHT  11/10/2017  . IR RADIOLOGIST EVAL & MGMT  10/02/2017  . IR US GUIDE VASC ACCESS RIGHT  11/10/2017    Social History:  reports that she quit smoking about 6 months ago. Her smoking use included cigarettes. She has a 40.00 pack-year smoking history. She has never used smokeless tobacco. She reports that she drinks alcohol. She reports that she does not use drugs.  Allergies: No Known Allergies  Medications Prior to Admission  Medication Sig Dispense Refill  . calcium carbonate (OS-CAL - DOSED IN MG OF ELEMENTAL CALCIUM) 1250 (500 Ca) MG tablet Take 1 tablet by mouth daily.     . hydrocortisone (CORTEF) 10 MG tablet Take 2 tablets (20 mg total) by mouth daily with  breakfast. And 35m po daily with lunch. (Patient taking differently: Take 10-20 mg by mouth See admin instructions. 20 mg every morning And 164mwith lunch.) 90 tablet 1  . lidocaine-prilocaine (EMLA) cream Apply to affected area once 30 g 3  . LORazepam (ATIVAN) 0.5 MG tablet Take 1 tablet (0.5 mg total) by  mouth every 6 (six) hours as needed (Nausea or vomiting). 30 tablet 0  . magic mouthwash w/lidocaine SOLN Take 10 mLs by mouth 4 (four) times daily as needed for mouth pain. Rinse and spit or swallow 240 mL 2  . metoCLOPramide (REGLAN) 10 MG tablet Take 1 tablet (10 mg total) by mouth every 8 (eight) hours as needed for nausea. 45 tablet 1  . Multiple Vitamins-Minerals (MULTIVITAMIN WITH MINERALS) tablet Take 1 tablet by mouth daily.    . ondansetron (ZOFRAN) 4 MG tablet Take 1 tablet (4 mg total) by mouth every 8 (eight) hours as needed for nausea or vomiting. 20 tablet 0  . oxyCODONE (OXY IR/ROXICODONE) 5 MG immediate release tablet Take 1-2 tablets (5-10 mg total) by mouth every 4 (four) hours as needed for moderate pain or severe pain. 60 tablet 0  . polyethylene glycol (MIRALAX) packet Take 17 g by mouth daily as needed for moderate constipation. 30 each 0  . prochlorperazine (COMPAZINE) 10 MG tablet Take 1 tablet (10 mg total) by mouth every 6 (six) hours as needed (Nausea or vomiting). 30 tablet 1  . senna-docusate (SENNA S) 8.6-50 MG tablet Take 2 tablets by mouth at bedtime. (Patient taking differently: Take 2 tablets by mouth at bedtime as needed for mild constipation or moderate constipation. ) 60 tablet 1  . sucralfate (CARAFATE) 1 GM/10ML suspension Take 10 mLs (1 g total) by mouth 3 (three) times daily. With meals (Patient not taking: Reported on 04/13/2018) 840 mL 2    Blood pressure 116/75, pulse (!) 52, temperature 98.4 F (36.9 C), temperature source Oral, resp. rate 18, height 5' 3"  (1.6 m), weight 51.9 kg, SpO2 93 %. Physical Exam: Physical Exam  Constitutional: She is oriented to person, place, and time. She appears well-developed. She is cooperative.  Non-toxic appearance. No distress.  HENT:  Head: Normocephalic and atraumatic.  Right Ear: External ear normal.  Left Ear: External ear normal.  Nose: Nose normal.  Mouth/Throat: Oropharynx is clear and moist and mucous  membranes are normal.  Eyes: Conjunctivae, EOM and lids are normal. No scleral icterus.  Pupils equal and round   Cardiovascular: Normal rate and regular rhythm.  Pulses:      Radial pulses are 2+ on the right side, and 2+ on the left side.       Dorsalis pedis pulses are 2+ on the right side, and 2+ on the left side.  Pulmonary/Chest: Effort normal and breath sounds normal.  Abdominal: Soft. Bowel sounds are normal. She exhibits no distension. There is no hepatosplenomegaly. There is no tenderness. There is no rigidity, no rebound and no guarding. No hernia.  Musculoskeletal:  ROM grossly intact in bilateral upper and lower extremities  Neurological: She is alert and oriented to person, place, and time. No sensory deficit.  Skin: Skin is warm, dry and intact. She is not diaphoretic. No pallor.  Psychiatric: She has a normal mood and affect. Her speech is normal and behavior is normal.    Results for orders placed or performed during the hospital encounter of 04/12/18 (from the past 48 hour(s))  Basic metabolic panel     Status: Abnormal  Collection Time: 04/15/18  5:00 AM  Result Value Ref Range   Sodium 129 (L) 135 - 145 mmol/L    Comment: DELTA CHECK NOTED NO VISIBLE HEMOLYSIS    Potassium 3.7 3.5 - 5.1 mmol/L   Chloride 97 (L) 98 - 111 mmol/L   CO2 22 22 - 32 mmol/L   Glucose, Bld 109 (H) 70 - 99 mg/dL   BUN 6 (L) 8 - 23 mg/dL   Creatinine, Ser 0.36 (L) 0.44 - 1.00 mg/dL   Calcium 7.5 (L) 8.9 - 10.3 mg/dL   GFR calc non Af Amer >60 >60 mL/min   GFR calc Af Amer >60 >60 mL/min    Comment: (NOTE) The eGFR has been calculated using the CKD EPI equation. This calculation has not been validated in all clinical situations. eGFR's persistently <60 mL/min signify possible Chronic Kidney Disease.    Anion gap 10 5 - 15    Comment: Performed at [facility], [place] [address], [place], [place] [address]  Magnesium     Status: None   Collection Time: 04/15/18  5:00  AM  Result Value Ref Range   Magnesium 1.8 1.7 - 2.4 mg/dL    Comment: Performed at [facility], [place] [address] [place], [place] [address]  Hepatic function panel     Status: Abnormal   Collection Time: 04/15/18  5:00 AM  Result Value Ref Range   Total Protein 5.8 (L) 6.5 - 8.1 g/dL   Albumin 2.6 (L) 3.5 - 5.0 g/dL   AST 34 15 - 41 U/L   ALT 78 (H) 0 - 44 U/L   Alkaline Phosphatase 298 (H) 38 - 126 U/L   Total Bilirubin 1.0 0.3 - 1.2 mg/dL   Bilirubin, Direct 0.4 (H) 0.0 - 0.2 mg/dL   Indirect Bilirubin 0.6 0.3 - 0.9 mg/dL    Comment: Performed at [facility], [place] [address], [place], [place] [address]  CBC with Differential/Platelet     Status: Abnormal   Collection Time: 04/15/18  7:50 AM  Result Value Ref Range   WBC 10.2 4.0 - 10.5 K/uL   RBC 3.45 (L) 3.87 - 5.11 MIL/uL   Hemoglobin 10.0 (L) 12.0 - 15.0 g/dL   HCT 30.0 (L) 36.0 - 46.0 %   MCV 87.0 78.0 - 100.0 fL   MCH 29.0 26.0 - 34.0 pg   MCHC 33.3 30.0 - 36.0 g/dL   RDW 15.9 (H) 11.5 - 15.5 %   Platelets 282 150 - 400 K/uL   Neutrophils Relative % 84 %   Neutro Abs 8.6 (H) 1.7 - 7.7 K/uL   Lymphocytes Relative 10 %   Lymphs Abs 1.1 0.7 - 4.0 K/uL   Monocytes Relative 6 %   Monocytes Absolute 0.6 0.1 - 1.0 K/uL   Eosinophils Relative 0 %   Eosinophils Absolute 0.0 0.0 - 0.7 K/uL   Basophils Relative 0 %   Basophils Absolute 0.0 0.0 - 0.1 K/uL    Comment: Performed at [facility], [place] [address] [place], [place] [address]  Basic metabolic panel     Status: Abnormal   Collection Time: 04/16/18  3:50 AM  Result Value Ref Range   Sodium 125 (L) 135 - 145 mmol/L   Potassium 2.3 (LL) 3.5 - 5.1 mmol/L    Comment: DELTA CHECK NOTED REPEATED TO VERIFY CRITICAL RESULT CALLED TO, READ BACK BY AND VERIFIED WITHBettina Gavia RN [id number] 04/16/18 A NAVARRO    Chloride 87 (  L) 98 - 111 mmol/L   CO2 26 22 - 32 mmol/L   Glucose, Bld 91 70 - 99 mg/dL   BUN <5 (L) 8 - 23  mg/dL   Creatinine, Ser 0.36 (L) 0.44 - 1.00 mg/dL   Calcium 7.5 (L) 8.9 - 10.3 mg/dL   GFR calc non Af Amer >60 >60 mL/min   GFR calc Af Amer >60 >60 mL/min    Comment: (NOTE) The eGFR has been calculated using the CKD EPI equation. This calculation has not been validated in all clinical situations. eGFR's persistently <60 mL/min signify possible Chronic Kidney Disease.    Anion gap 12 5 - 15    Comment: Performed at [facility], [place] [address], [place], [place] [address]   Dg Ercp  Result Date: 04/15/2018 CLINICAL DATA:  Metastatic small cell lung carcinoma with history of biliary obstruction and previous metallic stent placement in the common bile duct. Imaging suggestion of potential relative obstruction of the bile duct stent. EXAM: ERCP TECHNIQUE: Multiple spot images obtained with the fluoroscopic device and submitted for interpretation post-procedure. COMPARISON:  CT of the abdomen on 04/12/2018 FINDINGS: C-arm imaging during the endoscopic procedure demonstrates balloon dilatation of the indwelling metallic common bile duct stent. During the procedure there appears to be placement of an overlapping metallic stent. Occlusion cholangiogram shows opacification of the biliary tree proximal to the stented segment. IMPRESSION: Imaging obtained during balloon dilatation of an indwelling metallic common bile duct stent and placement of new overlapping metallic biliary stent. These images were submitted for radiologic interpretation only. Please see the procedural report for the amount of contrast and the fluoroscopy time utilized. Electronically Signed   By: Aletta Edouard M.D.   On: 04/15/2018 07:56      Assessment/Plan Metastatic small cell lung cancer Malignant obstructive jaundice Pathologic L2 fracture  Bacteremia - blood cxs from 8/18 grew out klebsiella and enterobacter - related to biliary obstruction and subsequent cholangitis - s/p ERCP biliary stent  8/20 - ID involved and guiding abx therapy, recommending port removal and insertion of PICC after 2 days line free - have reached out to Dr. Grier Mitts office to ensure port removal does not impede any treatment plans for this patient  - will tentatively plan for port removal this afternoon   FEN: NPO, IVF VTE: SCDs ID: [email or web address] 8/19>>  Brigid Re, [facility] Surgery 04/16/2018, 7:29 AM Pager: [phone] Consults: [phone] Mon-Fri 7:00 am-4:30 pm Sat-Sun 7:00 am-11:30 am

## 2018-04-16 NOTE — Care Management Important Message (Signed)
Important Message  Patient Details  Name: Haley [name] MRN: [id number] Date of Birth: 21-Nov-1950   Medicare Important Message Given:  Yes    Kerin Salen 04/16/2018, 11:06 AMImportant Message  Patient Details  Name: Haley [name] MRN: [id number] Date of Birth: 1951/04/13   Medicare Important Message Given:  Yes    Kerin Salen 04/16/2018, 11:06 AM

## 2018-04-16 NOTE — Progress Notes (Signed)
HEMATOLOGY/ONCOLOGY INPATIENT PROGRESS NOTE  Date of Service: 04/16/2018  Inpatient Attending: .Mariel Aloe, MD   SUBJECTIVE:   Haley [name] reports that she feels sick, nauseous, and has a headache. She just arrived back in her room after her port removal and is anticipating a Picc line.   I had previously discussed recommendation for PICC line removal with Dr Baxter Flattery.  The pt reports that her abdominal pain is a little improved. She notes that she has feelings of fullness and is not able to eat very well.   She is understandly anxious about recurrent tumor. We discussed that at this time the goal if to recover from her hepatobiliary sepsis and infection and finish antibiotics prior to consideration of further immunotherapy.  Lab results today (04/16/18) of BMP and Hepatic function panel is as follows: all values are WNL except for Sodium at 125, Potassium at 2.3, Chloride at 87, BUN a <5, Creatinine at 0.36, Calcium at 7.5, Total Protein at 5.8, Albumin at 2.6, ALT at 78, Alk Phos at 298, Bilirubin at 0.4.  On review of systems, pt reports nausea, headache, feelings of fullness, marginally improved abdominal pain, not eating well, and denies any other symptoms.   OBJECTIVE:  Mild distress from anxiety and abdominal pain  PHYSICAL EXAMINATION: . Vitals:   04/16/18 1816 04/16/18 2151 04/17/18 0534 04/17/18 1417  BP: (!) 152/86 (!) 141/88 (!) 133/94 109/74  Pulse: 65 60 63 78  Resp: 20 14 18 20   Temp: 98.2 F (36.8 C) 97.7 F (36.5 C) 98.4 F (36.9 C) 98 F (36.7 C)  TempSrc: Axillary Oral Oral Oral  SpO2: 95% 95% 96% 98%  Weight:   120 lb 13 oz (54.8 kg)   Height:       Filed Weights   04/14/18 1238 04/15/18 0530 04/16/18 0500  Weight: 124 lb 5.4 oz (56.4 kg) 124 lb 5.4 oz (56.4 kg) 114 lb 6.7 oz (51.9 kg)   .Body mass index is 20.27 kg/m.  GENERAL:alert, mild icterus SKIN: no acute rashes, no significant lesions EYES: mild icterus OROPHARYNX: MMM, no  exudates, no oropharyngeal erythema or ulceration NECK: supple, no JVD LYMPH:  no palpable lymphadenopathy in the cervical, axillary or inguinal regions LUNGS: clear to auscultation b/l with normal respiratory effort HEART: regular rate & rhythm ABDOMEN:  Mild TTP RUQ Extremity: no pedal edema PSYCH: alert & oriented x 3 with fluent speech NEURO: no focal motor/sensory deficits   MEDICAL HISTORY:  Past Medical History:  Diagnosis Date  . Allergy   . Back pain   . Osteopenia   . 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 Tubular adenoma of colon 10/26/2014    SURGICAL HISTORY: Past Surgical History:  Procedure Laterality Date  . BILIARY STENT PLACEMENT N/A 04/14/2018   Procedure: BILIARY STENT PLACEMENT;  Surgeon: [name], MD;  Location: [facility];  Service: Gastroenterology;  Laterality: N/A;  . COLONOSCOPY    . COLONOSCOPY WITH PROPOFOL N/A 10/25/2014   Procedure: COLONOSCOPY WITH PROPOFOL;  Surgeon: Garlan Fair, MD;  Location: [facility];  Service: Endoscopy;  Laterality: N/A;  . ERCP N/A 10/15/2017   Procedure: ENDOSCOPIC RETROGRADE CHOLANGIOPANCREATOGRAPHY (ERCP);  Surgeon: Clarene Essex, MD;  Location: [facility];  Service: Endoscopy;  Laterality: N/A;  . ERCP N/A 04/14/2018   Procedure: ENDOSCOPIC RETROGRADE CHOLANGIOPANCREATOGRAPHY (ERCP);  Surgeon: [name], MD;  Location: [facility];  Service: Gastroenterology;  Laterality: N/A;  with stent change  and Balloon Sweep  . IR FLUORO GUIDE PORT INSERTION RIGHT  11/10/2017  . IR RADIOLOGIST EVAL & MGMT  10/02/2017  . IR US GUIDE VASC ACCESS RIGHT  11/10/2017    SOCIAL HISTORY: Social History   Socioeconomic History  . Marital status: Single    Spouse name: Not on file  . Number of children: Not on file  . Years of education: Not on file  . Highest education level: Not on file  Occupational History  . Not on file  Social Needs  . Financial resource strain:  Not on file  . Food insecurity:    Worry: Not on file    Inability: Not on file  . Transportation needs:    Medical: Not on file    Non-medical: Not on file  Tobacco Use  . Smoking status: Former Smoker    Packs/day: 1.00    Years: 40.00    Pack years: 40.00    Types: Cigarettes    Last attempt to quit: 09/24/2017    Years since quitting: 0.5  . Smokeless tobacco: Never Used  Substance and Sexual Activity  . Alcohol use: Yes    Comment: rare social  . Drug use: No  . Sexual activity: Not Currently  Lifestyle  . Physical activity:    Days per week: Not on file    Minutes per session: Not on file  . Stress: Not on file  Relationships  . Social connections:    Talks on phone: Not on file    Gets together: Not on file    Attends religious service: Not on file    Active member of club or organization: Not on file    Attends meetings of clubs or organizations: Not on file    Relationship status: Not on file  . Intimate partner violence:    Fear of current or ex partner: Not on file    Emotionally abused: Not on file    Physically abused: Not on file    Forced sexual activity: Not on file  Other Topics Concern  . Not on file  Social History Narrative  . Not on file    FAMILY HISTORY: Family History  Problem Relation Age of Onset  . Diabetes Mother   . Hypertension Mother   . Diabetes Sister   . Hypertension Sister   . Hyperlipidemia Sister     ALLERGIES:  has No Known Allergies.  MEDICATIONS:  Scheduled Meds: . calcium carbonate  1 tablet Oral Q breakfast  . Chlorhexidine Gluconate Cloth  6 each Topical Daily  . feeding supplement  1 Container Oral TID BM  . hydrocortisone sod succinate (SOLU-CORTEF) inj  50 mg Intravenous Q12H  . mouth rinse  15 mL Mouth Rinse BID  . multivitamin with minerals  1 tablet Oral Daily  . ondansetron       Continuous Infusions: . sodium chloride 10 mL/hr at 04/13/18 0800  . sodium chloride 250 mL (04/14/18 2317)  . sodium  chloride Stopped (04/16/18 1414)  . cefTRIAXone (ROCEPHIN)  IV 2 g (04/16/18 1310)  . famotidine (PEPCID) IV 20 mg (04/16/18 1134)  . lactated ringers Stopped (04/16/18 0533)   PRN Meds:.sodium chloride, sodium chloride, diphenhydrAMINE, ketorolac, LORazepam, magic mouthwash w/lidocaine, metoCLOPramide, morphine injection, ondansetron, oxyCODONE, polyethylene glycol, prochlorperazine, senna-docusate, sodium chloride flush, zolpidem  REVIEW OF SYSTEMS:    .10 Point review of Systems was done is negative except as noted above.    LABORATORY DATA:  I have reviewed the data as  listed  . CBC Latest Ref Rng & Units 04/16/2018 04/15/2018 04/14/2018  WBC 4.0 - 10.5 K/uL - 10.2 16.2(H)  Hemoglobin 12.0 - 15.0 g/dL 11.9(L) 10.0(L) 9.9(L)  Hematocrit 36.0 - 46.0 % 35.0(L) 30.0(L) 29.0(L)  Platelets 150 - 400 K/uL - 282 324    . CMP Latest Ref Rng & Units 04/16/2018 04/16/2018 04/15/2018  Glucose 70 - 99 mg/dL - 91 109(H)  BUN 8 - 23 mg/dL - <5(L) 6(L)  Creatinine 0.44 - 1.00 mg/dL - 0.36(L) 0.36(L)  Sodium 135 - 145 mmol/L 125(L) 125(L) 129(L)  Potassium 3.5 - 5.1 mmol/L 3.7 2.3(LL) 3.7  Chloride 98 - 111 mmol/L - 87(L) 97(L)  CO2 22 - 32 mmol/L - 26 22  Calcium 8.9 - 10.3 mg/dL - 7.5(L) 7.5(L)  Total Protein 6.5 - 8.1 g/dL - - 5.8(L)  Total Bilirubin 0.3 - 1.2 mg/dL - - 1.0  Alkaline Phos 38 - 126 U/L - - 298(H)  AST 15 - 41 U/L - - 34  ALT 0 - 44 U/L - - 78(H)     RADIOGRAPHIC STUDIES: I have personally reviewed the radiological images as listed and agreed with the findings in the report. Ct Head Wo Contrast  Result Date: 04/12/2018 CLINICAL DATA:  History of liver cancer and small cell lung cancer. Last chemotherapy 2 weeks ago. Abdominal pain and altered mental status. EXAM: CT HEAD WITHOUT CONTRAST TECHNIQUE: Contiguous axial images were obtained from the base of the skull through the vertex without intravenous contrast. COMPARISON:  02/24/2018 FINDINGS: Brain: No mass effect or  midline shift. No abnormal extra-axial fluid collections. No ventricular dilatation. There is vague low-attenuation change in the left anterior frontal white matter. This is similar to previous study and likely represents an area of small vessel ischemic change. If there is high clinical suspicion for intracranial metastasis, then contrast-enhanced MR would be the more sensitive examination. Gray-white matter junctions are distinct. Basal cisterns are not effaced. No acute intracranial hemorrhage. Vascular: Mild intracranial arterial calcifications are present. Skull: Calvarium appears intact. No acute depressed skull fractures or destructive bone lesions identified. Sinuses/Orbits: There is an air-fluid level in the left sphenoid sinus, similar previous study. This is likely inflammatory. Paranasal sinuses and mastoid air cells are otherwise clear. Other: None. IMPRESSION: No acute intracranial abnormalities. Focal low-attenuation change in the left anterior frontal white matter is similar to previous study and likely represents an area of small vessel ischemic change. If there is high clinical suspicion for intracranial metastasis, then contrast-enhanced MR would be the more sensitive examination. Electronically Signed   By: Lucienne Capers M.D.   On: 04/12/2018 23:24   Ct Abdomen Pelvis W Contrast  Result Date: 04/12/2018 CLINICAL DATA:  Liver cancer abdominal pain EXAM: CT ABDOMEN AND PELVIS WITH CONTRAST TECHNIQUE: Multidetector CT imaging of the abdomen and pelvis was performed using the standard protocol following bolus administration of intravenous contrast. CONTRAST:  65m ISOVUE-300 IOPAMIDOL (ISOVUE-300) INJECTION 61% COMPARISON:  MRI 02/25/2018, PET-CT 12/26/2017, CT 05/23/2017 FINDINGS: Lower chest: Lung bases demonstrate tiny pleural effusions. Passive atelectasis in the posterior left lung base. Persistent collapse in the right lower lobe with occlusion of the bronchus as before. Heart size  within normal limits. Coronary vascular calcification. Hepatobiliary: Increased intra hepatic biliary dilatation since prior study. Increased size of hypodense metastatic liver lesions. Index posterior right hepatic lobe lesion now measures 23 mm compared with 10 mm previously. A left lobe lesion measures 16 mm, compared with 9 mm previously. Decreased pneumobilia with only minimal  foci of pneumobilia evident. No air visible within the biliary stent except at its duodenal termination. Minimal wall thickening of the gallbladder. No calcified stones. Pancreas: No inflammatory changes.  No measurable mass. Spleen: Normal in size without focal abnormality. Adrenals/Urinary Tract: Right adrenal gland is within normal limits. Subtle increased nodularity of the left adrenal gland no hydronephrosis. The bladder is distended Stomach/Bowel: The stomach is nonenlarged. No dilated small bowel. No colon wall thickening. Vascular/Lymphatic: Moderate to marked aortic atherosclerosis. No aneurysmal dilatation. No definitive adenopathy. Reproductive: Uterus and bilateral adnexa are unremarkable. Other: Negative for free air. Tiny amount of free fluid in the pelvis. Musculoskeletal: Extensive skeletal metastatic disease. Vertically oriented fracture through L2 with retropulsion and moderate canal stenosis as before. IMPRESSION: 1. Biliary stent in place. Overall decreased pneumobilia since prior CT with minimal foci of gas present. Interval increase in intra hepatic biliary dilatation; constellation of findings is concerning for stent occlusion. 2. Increased size of hepatic metastatic lesions consistent with disease progression. 3. Right infrahilar soft tissue density/presumed mass and collapse of the right lower lobe as before. Trace pleural effusion 4. Subtle increased nodularity of the left adrenal gland is also concerning for metastatic disease. 5. Widespread skeletal metastatic disease with pathologic L2 fracture, retropulsion,  and canal stenosis as noted on the prior exams. Electronically Signed   By: Donavan Foil M.D.   On: 04/12/2018 23:40   Dg Chest Port 1 View  Result Date: 04/12/2018 CLINICAL DATA:  Fever and cough EXAM: PORTABLE CHEST 1 VIEW COMPARISON:  03/01/2018, 12/19/2017 FINDINGS: Right-sided central venous port tip overlies the cavoatrial region. No acute airspace disease or effusion. Stable cardiomediastinal silhouette. No pneumothorax. Minimal atelectasis or scar at the left base. IMPRESSION: No active disease. Electronically Signed   By: Donavan Foil M.D.   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 Imaging suggestion of potential relative obstruction of the bile duct stent. EXAM: ERCP TECHNIQUE: Multiple spot images obtained with the fluoroscopic device and submitted for interpretation post-procedure. COMPARISON:  CT of the abdomen on 04/12/2018 FINDINGS: C-arm imaging during the endoscopic procedure demonstrates balloon dilatation of the indwelling metallic common bile duct stent. During the procedure there appears to be placement of an overlapping metallic stent. Occlusion cholangiogram shows opacification of the biliary tree proximal to the stented segment. IMPRESSION: Imaging obtained during balloon dilatation of an indwelling metallic common bile duct stent and placement of new overlapping metallic biliary stent. These images were submitted for radiologic interpretation only. Please see the procedural report for the amount of contrast and the fluoroscopy time utilized. Electronically Signed   By: Aletta Edouard M.D.   On: 04/15/2018 07:56    ASSESSMENT & PLAN:  67 y.o. female with  1. 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  Liver mets + Bone mets + adrenal mets Pancreatic mets- FNA confirmed MRI brain 10/29/2017 after this clinic visit showed concern for multiple asymptomatic subcentimeter brain mets. 11/28/17 Brain MRI which revealed stable lesions, and new nonenhancing lesions. I discussed this imaging study with Dr Ulis Rias our neuro-oncologist -- no area of critical brain mets that needs patient to rush into WBRT at this time.  12/26/17 PET which revealed Marked improvement in the previously demonstrated widespread  hypermetabolic metastatic disease. There is only mild residual hypermetabolic activity within some of the osseous lesions.   patient has completed 4 cycles of carboplatin/etoposide/Atezolizumab and completed -completed WBRT 30 Gy in 10 fractions 01/15/2018 - 01/29/2018   CT abd 8/18-- concern for progression of mets in liver.  2. Hyponatremia - poor po intake + adrenal insufficiency+ ?SIADH 3. Hypokalemia PLAN -mx per hospitalist IVF and stress dose steroids Consult nephrology if needed   3.s/p previous obstructive Jaundice due to biliary compression from pancreatic mass  -ERCP attempted by Dr Watt Climes - not successful -ERCP completed on 10/24/17 with Dr. Harley Hallmark with much improved LFTs -Bx of  pancreatic lesion -consistent with small cell lung cancer. Now with abnormal LFTs and concerns with stent blockages and possible biliary infection. Rpt ERCP and restenting 04/15/2018 - concern for A single localized biliary stricture was found in the lower third of the main bile duct. The stricture was malignant appearing secondary to tumor ingrowth. - The lower third of the main bile duct was dilated One partially covered metal stent was placed into the common bile duct. -improving LFTs post re stenting  4. Back pain related to pathologic L2 fracture (causing elevated alkaline phosphatase levels.) - stable  5. Moderate to severe protein malnutrition due to weight  loss -resolved  7. Sepsis with Enterobacteriaceae and Klebsiella pneumnoias - likely hepatobiliary source in the setting of abnormal LFTs and likely blocked biliary stent.  PLAN -discussed current lab and imaging findings with patient at bedside and answered their multiple quesitions -appreciate care from critical care team , hospital medicine and GI. -Discussed with pt that I will defer care of her sepsis to the ICU team and that we will prioritize her cancer treatment after her stent obstruction and infection have both resolved. -discussed with ID- Dr Baxter Flattery [facility] removed today due to concern for secondary infection. Will have PICC line for Abx.  Do not anticipate immediately needing a central line from oncologic treatment standpoint. -goals of care discussion had. -Advised that the pt do her best to eat well and stay hydrated -Will f/u patient in clinic in 2 weeks with labs to discuss further treatment for relapsed small cell lung cancer. Will be considering Immunotherapy (Pembrolizumab)  The total time spent in the appt was 25 minutes and more than 50% was on counseling and direct patient cares.     Sullivan Lone MD MS AAHIVMS [facility] Hematology/Oncology Physician [facility]  (Office):       [phone] (Work cell):  [phone] (Fax):           [phone]  04/16/2018 4:31 PM  I, Baldwin [place], am acting as a scribe for Dr. Irene Limbo  I have reviewed the above documentation for accuracy and completeness, and I agree with the above. Sullivan Lone MD MS

## 2018-04-16 NOTE — Progress Notes (Signed)
Stable following ERCP from 2 days ago.  Liver chemistries not repeated today, but they were substantially improved as of yesterday.  The patient's chief complaint is nausea and feeding difficulties, with early satiety.  As I explained to the patient, this is presumed to be a consequence of her underlying tumor with diffuse metastases including liver and pancreas, rather than a primary gastrointestinal problem.  Of note, there were no overt endoscopic abnormalities noted at the time of her ERCP 2 days ago.  We will sign off.  Please call us if further input from [place] as needed.  Cleotis Nipper, M.D. Pager [phone] If no answer or after 5 PM call [phone]

## 2018-04-17 ENCOUNTER — Inpatient Hospital Stay: Payer: Self-pay

## 2018-04-17 ENCOUNTER — Encounter (HOSPITAL_COMMUNITY): Payer: Self-pay | Admitting: Surgery

## 2018-04-17 LAB — BASIC METABOLIC PANEL
ANION GAP: 10 (ref 5–15)
ANION GAP: 11 (ref 5–15)
ANION GAP: 10 (ref 5–15)
BUN: 7 mg/dL — ABNORMAL LOW (ref 8–23)
BUN: 7 mg/dL — ABNORMAL LOW (ref 8–23)
BUN: 6 mg/dL — ABNORMAL LOW (ref 8–23)
CHLORIDE: 91 mmol/L — AB (ref 98–111)
CHLORIDE: 91 mmol/L — AB (ref 98–111)
CO2: 23 mmol/L (ref 22–32)
CO2: 22 mmol/L (ref 22–32)
CO2: 22 mmol/L (ref 22–32)
CREATININE: 0.33 mg/dL — AB (ref 0.44–1.00)
CREATININE: 0.38 mg/dL — AB (ref 0.44–1.00)
Calcium: 7.7 mg/dL — ABNORMAL LOW (ref 8.9–10.3)
Calcium: 7.5 mg/dL — ABNORMAL LOW (ref 8.9–10.3)
Calcium: 7.4 mg/dL — ABNORMAL LOW (ref 8.9–10.3)
Chloride: 91 mmol/L — ABNORMAL LOW (ref 98–111)
GFR calc non Af Amer: >60 mL/min (ref >60)
GFR calc non Af Amer: >60 mL/min (ref >60)
Glucose, Bld: 106 mg/dL — ABNORMAL HIGH (ref 70–99)
Glucose, Bld: 152 mg/dL — ABNORMAL HIGH (ref 70–99)
Glucose, Bld: 195 mg/dL — ABNORMAL HIGH (ref 70–99)
POTASSIUM: 3.4 mmol/L — AB (ref 3.5–5.1)
POTASSIUM: 2.8 mmol/L — AB (ref 3.5–5.1)
Potassium: 3.2 mmol/L — ABNORMAL LOW (ref 3.5–5.1)
SODIUM: 124 mmol/L — AB (ref 135–145)
SODIUM: 124 mmol/L — AB (ref 135–145)
SODIUM: 123 mmol/L — AB (ref 135–145)

## 2018-04-17 LAB — CBC
HEMATOCRIT: 34.3 % — AB (ref 36.0–46.0)
HEMOGLOBIN: 11.7 g/dL — AB (ref 12.0–15.0)
MCH: 28.9 pg (ref 26.0–34.0)
MCHC: 34.1 g/dL (ref 30.0–36.0)
MCV: 84.7 fL (ref 78.0–100.0)
Platelets: 396 10*3/uL (ref 150–400)
RBC: 4.05 MIL/uL (ref 3.87–5.11)
RDW: 15.1 % (ref 11.5–15.5)
WBC: 6.3 10*3/uL (ref 4.0–10.5)

## 2018-04-17 LAB — MAGNESIUM: MAGNESIUM: 1.7 mg/dL (ref 1.7–2.4)

## 2018-04-17 MED ORDER — POTASSIUM CHLORIDE CRYS ER 20 MEQ PO TBCR
40.0000 meq | EXTENDED_RELEASE_TABLET | ORAL | Status: AC
  Administered 2018-04-17 (×2): 40 meq via ORAL
  Filled 2018-04-17 (×2): qty 2

## 2018-04-17 MED ORDER — FUROSEMIDE 10 MG/ML IJ SOLN: 40.0000 mg | Freq: Every day | INTRAMUSCULAR | Status: DC

## 2018-04-17 MED ORDER — FUROSEMIDE 10 MG/ML IJ SOLN: 40.0000 mg | Freq: Once | INTRAMUSCULAR | Status: DC

## 2018-04-17 MED ORDER — SODIUM CHLORIDE 0.9 % IV SOLN
INTRAVENOUS | Status: DC
  Administered 2018-04-17: 20:00:00 via INTRAVENOUS

## 2018-04-17 NOTE — Plan of Care (Signed)
No changes needed to care plans at this time. Pt stable with no needs.

## 2018-04-17 NOTE — Plan of Care (Signed)
Plan of care discussed with patient 

## 2018-04-17 NOTE — Progress Notes (Signed)
Requested clarification of order for PICC.  Order states that PICC needs to be placed 8-23 after 3pm, 48 hours after PAC removal.  Port was removed on 8-22 per OP note.  RN to have order updated.  Carolee Rota, RN VAST

## 2018-04-17 NOTE — Progress Notes (Signed)
PROGRESS NOTE    HEIRESS [name]  TWS:[id number] DOB: [birth year] DOA: 04/12/2018 PCP: Alycia Rossetti, MD   Brief Narrative: Haley [name] is a 67 y.o. female with a history of small cell carcinoma (neuroendocrine) with metastasis. She presented with altered mental status and weakness and found to have sepsis. Improved with empiric antibiotics.   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 Physiology improved. -Continue antibiotics  Klebsiella pneumoniae bacteremia Biliary source. Complicated by patient's Portacath -Infectious disease recommendations: remove port -Consult general surgery for port removal -Repeat blood cultures (8/21) no growth to date -Continue ceftriaxone  Malignant obstructive jaundice Patient with with liver metastasis and pancreatic head metastasis. Recently completed a round of chemotherapy. S/p ERCP on 8/20 with metal biliary stent placement  Hyponatremia/hypokalemia/hypomagnesemia/hypophosphatemia Likely complicated by history of adrenal insufficiency but potassium low. Sodium and potassium low today. Sodium worsened -Recheck BMP this afternoon. 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 -Continue Solu-cortef at current dose  Nausea -Zofran  prn   DVT prophylaxis: Heparin Code Status:   Code Status: Full Code Family Communication: None at bedside Disposition Plan: Discharge pending management of infection and hyponatremia   Consultants:   Infectious disease  General surgery  Procedures:   None  Antimicrobials:  Vancomycin  Flagyl  Cefepime  Ceftriaxone    Subjective: Feels better today. Eager to go home.  Objective: Vitals:   04/16/18 1816 04/16/18 2151 04/17/18 0534 04/17/18 1417  BP: (!) 152/86 (!) 141/88 (!) 133/94 109/74  Pulse: 65 60 63 78  Resp: 20 14 18 20   Temp: 98.2 F (36.8 C) 97.7 F (36.5 C) 98.4 F (36.9 C) 98 F (36.7 C)  TempSrc: Axillary Oral Oral Oral  SpO2: 95% 95% 96% 98%  Weight:   54.8 kg   Height:        Intake/Output Summary (Last 24 hours) at 04/17/2018 1522 Last data filed at 04/17/2018 1400 Gross per 24 hour  Intake 2064.13 ml  Output 1975 ml  Net 89.13 ml   Filed Weights   04/15/18 0530 04/16/18 0500 04/17/18 0534  Weight: 56.4 kg 51.9 kg 54.8 kg    Examination:  General exam: Appears calm and comfortable Respiratory system: Clear to auscultation. Respiratory effort normal. Cardiovascular system: S1 & S2 heard, RRR. No murmurs, rubs, gallops or clicks. Gastrointestinal system: Abdomen is nondistended, soft and nontender. No organomegaly or masses felt. Normal bowel sounds heard. Central nervous system: Alert and oriented. No focal neurological deficits. Extremities: No edema. No calf tenderness Skin: No cyanosis. No rashes Psychiatry: Judgement and insight appear normal. Mood & affect appropriate.    Data Reviewed: I have personally reviewed following labs and imaging studies  CBC: Recent Labs  Lab 04/12/18 2151 04/13/18 0307 04/14/18 0410 04/15/18 0750 04/16/18 1414 04/17/18 0959  WBC 13.8* 15.6* 16.2* 10.2  --  6.3  NEUTROABS  --   --  14.9* 8.6*  --   --   HGB 12.6 11.5* 9.9* 10.0* 11.9* 11.7*  HCT 36.7 33.7* 29.0* 30.0* 35.0* 34.3*  MCV  86.4 85.8 86.1 87.0  --  84.7  PLT 436* 318 324 282  --  469   Basic Metabolic Panel: Recent Labs  Lab 04/13/18 0324 04/13/18 0631 04/13/18 0640  04/14/18 0410 04/15/18 0500 04/16/18 0350 04/16/18 1414 04/16/18 1536 04/17/18 0959  NA 129* 131*  --    < > 136 129* 125* 125* 127* 124*  K 2.6* 2.6*  --    < > 3.3* 3.7 2.3* 3.7 3.5 3.4*  CL 97* 97*  --    < > 104 97* 87*  --  90* 91*  CO2 22 21*  --    < > 24 22 26   --  25 23  GLUCOSE 134* 116*  --    < > 129* 109* 91  --  108* 106*  BUN 7* 7*  --    < > 8 6* <5*  --  <5* 7*  CREATININE <0.30* 0.33*  --    < > <0.30* 0.36* 0.36*  --  0.37* <0.30*  CALCIUM 7.8* 7.4*  --    < > 7.0* 7.5* 7.5*  --  8.1* 7.7*  MG  --  1.4*  --   --   --  1.8  --   --   --   --   PHOS 1.9* 2.6 1.8*  --   --   --   --   --   --   --    < > = values in this interval not displayed.   GFR: CrCl cannot be calculated (This lab value cannot be used to calculate CrCl because it is not a number: <0.30). Liver Function Tests: Recent Labs  Lab 04/12/18 2151 04/13/18 0324 04/13/18 0631 04/14/18 0410 04/15/18 0500  AST 282*  --   --  77* 34  ALT 204*  --   --  114* 78*  ALKPHOS 519*  --   --  364* 298*  BILITOT 3.6*  --   --  1.2 1.0  PROT 7.0  --   --  5.6* 5.8*  ALBUMIN 3.1* 2.7* 2.4* 2.5* 2.6*   Recent Labs  Lab 04/12/18 2151  LIPASE 26   Recent Labs  Lab 04/12/18 2200  AMMONIA 58*   Coagulation Profile: Recent Labs  Lab 04/14/18 0410  INR 1.29   Cardiac Enzymes: No results for input(s): CKTOTAL, CKMB, CKMBINDEX, TROPONINI in the last 168 hours. BNP (last 3 results) No results for input(s): PROBNP in the last 8760 hours. HbA1C: No results for input(s): HGBA1C in the last 72 hours. CBG: No results for input(s): GLUCAP in the last 168 hours. Lipid Profile: No results for input(s): CHOL, HDL, LDLCALC, TRIG, CHOLHDL, LDLDIRECT in the last 72 hours. Thyroid Function Tests: No results for input(s): TSH, T4TOTAL, FREET4, T3FREE, THYROIDAB  in the last 72 hours. Anemia Panel: No results for input(s): VITAMINB12, FOLATE, FERRITIN, TIBC, IRON, RETICCTPCT in the last 72 hours. Sepsis Labs: Recent Labs  Lab 04/12/18 2203 04/12/18 2359 04/13/18 0340 04/13/18 0631  LATICACIDVEN 2.48* 3.14* 2.0* 1.4    Recent Results (from the past 240 hour(s))  Blood Culture (routine x 2)     Status: Abnormal   Collection Time: 04/12/18  9:27 PM  Result Value Ref Range Status   Specimen Description   Final  BLOOD LEFT ANTECUBITAL Performed at [facility] [address] [place], [place] [address]    Special Requests   Final    BOTTLES DRAWN AEROBIC AND ANAEROBIC Blood Culture adequate volume Performed at [facility] [address] [place], [place] [address]    Culture  Setup Time   Final    GRAM NEGATIVE RODS IN BOTH AEROBIC AND ANAEROBIC BOTTLES CRITICAL RESULT CALLED TO, READ BACK BY AND VERIFIED WITH: Guadlupe Spanish PharmD 11:35 04/13/18 ([email or web address]) Performed at [facility], [place] [address], [place], [place] [address]    Culture KLEBSIELLA PNEUMONIAE (A)  Final   Report Status 04/15/2018 FINAL  Final   Organism ID, Bacteria KLEBSIELLA PNEUMONIAE  Final      Susceptibility   Klebsiella pneumoniae - MIC*    AMPICILLIN >=32 RESISTANT Resistant     CEFAZOLIN <=4 SENSITIVE Sensitive     CEFEPIME <=1 SENSITIVE Sensitive     CEFTAZIDIME <=1 SENSITIVE Sensitive     CEFTRIAXONE <=1 SENSITIVE Sensitive     CIPROFLOXACIN <=0.25 SENSITIVE Sensitive     GENTAMICIN <=1 SENSITIVE Sensitive     IMIPENEM <=0.25 SENSITIVE Sensitive     TRIMETH/SULFA <=20 SENSITIVE Sensitive     AMPICILLIN/SULBACTAM 8 SENSITIVE Sensitive     PIP/TAZO <=4 SENSITIVE Sensitive     Extended ESBL NEGATIVE Sensitive     * KLEBSIELLA PNEUMONIAE  Blood Culture ID Panel (Reflexed)     Status: Abnormal   Collection Time: 04/12/18  9:27 PM  Result Value Ref Range Status   Enterococcus species NOT DETECTED NOT DETECTED  Final   Listeria monocytogenes NOT DETECTED NOT DETECTED Final   Staphylococcus species NOT DETECTED NOT DETECTED Final   Staphylococcus aureus NOT DETECTED NOT DETECTED Final   Streptococcus species NOT DETECTED NOT DETECTED Final   Streptococcus agalactiae NOT DETECTED NOT DETECTED Final   Streptococcus pneumoniae NOT DETECTED NOT DETECTED Final   Streptococcus pyogenes NOT DETECTED NOT DETECTED Final   Acinetobacter baumannii NOT DETECTED NOT DETECTED Final   Enterobacteriaceae species DETECTED (A) NOT DETECTED Final    Comment: Enterobacteriaceae represent a large family of gram-negative bacteria, not a single organism. CRITICAL RESULT CALLED TO, READ BACK BY AND VERIFIED WITH: Guadlupe Spanish PharmD 11:35 04/13/18 ([email or web address])    Enterobacter cloacae complex NOT DETECTED NOT DETECTED Final   Escherichia coli NOT DETECTED NOT DETECTED Final   Klebsiella oxytoca NOT DETECTED NOT DETECTED Final   Klebsiella pneumoniae DETECTED (A) NOT DETECTED Final    Comment: CRITICAL RESULT CALLED TO, READ BACK BY AND VERIFIED WITH: Guadlupe Spanish PharmD 11:35 04/13/18 ([email or web address])    Proteus species NOT DETECTED NOT DETECTED Final   Serratia marcescens NOT DETECTED NOT DETECTED Final   Carbapenem resistance NOT DETECTED NOT DETECTED Final   Haemophilus influenzae NOT DETECTED NOT DETECTED Final   Neisseria meningitidis NOT DETECTED NOT DETECTED Final   Pseudomonas aeruginosa NOT DETECTED NOT DETECTED Final   Candida albicans NOT DETECTED NOT DETECTED Final   Candida glabrata NOT DETECTED NOT DETECTED Final   Candida krusei NOT DETECTED NOT DETECTED Final   Candida parapsilosis NOT DETECTED NOT DETECTED Final   Candida tropicalis NOT DETECTED NOT DETECTED Final    Comment: Performed at [facility], [place] [address], [place], [place] [address]  Blood Culture (routine x 2)     Status: Abnormal   Collection Time: 04/12/18 10:17 PM  Result Value Ref Range Status   Specimen Description   Final    BLOOD  LEFT ANTECUBITAL Performed at [facility] [address] [place], [place] [address]    Special Requests   Final    BOTTLES DRAWN AEROBIC AND ANAEROBIC Blood Culture adequate volume Performed at [facility] [address], [place], [place] [address]    Culture  Setup Time   Final    GRAM NEGATIVE RODS IN BOTH AEROBIC AND ANAEROBIC BOTTLES CRITICAL VALUE NOTED.  VALUE IS CONSISTENT WITH PREVIOUSLY REPORTED AND CALLED VALUE.    Culture (A)  Final    KLEBSIELLA PNEUMONIAE SUSCEPTIBILITIES PERFORMED ON PREVIOUS CULTURE WITHIN THE LAST 5 DAYS. Performed at [facility], [place] [address] [place], [place] [address]    Report Status 04/15/2018 FINAL  Final  MRSA PCR Screening     Status: None   Collection Time: 04/13/18  4:43 AM  Result Value Ref Range Status   MRSA by PCR NEGATIVE NEGATIVE Final    Comment:        The GeneXpert MRSA Assay (FDA approved for NASAL specimens only), is one component of a comprehensive MRSA colonization surveillance program. It is not intended to diagnose MRSA infection nor to guide or monitor treatment for MRSA infections. Performed at [facility] [place] [address], [place], [place] [address]   Culture, Urine     Status: None   Collection Time: 04/13/18 12:49 PM  Result Value Ref Range Status   Specimen Description   Final    URINE, CLEAN CATCH Performed at [facility], [place] [address] [place], [place] [address]    Special Requests   Final    Immunocompromised Performed at [facility], [place] [address], [place], [place] [address]    Culture   Final    NO GROWTH Performed at [facility], [place] [address], [place], [place] [address]    Report Status 04/14/2018 FINAL  Final  Culture, blood (routine x 2)     Status: None (Preliminary result)   Collection Time: 04/15/18  2:30 PM  Result Value Ref Range Status   Specimen Description   Final     BLOOD RIGHT ANTECUBITAL Performed at [facility] [address], [place], [place] [address]    Special Requests   Final    BOTTLES DRAWN AEROBIC AND ANAEROBIC Blood Culture adequate volume Performed at [facility] [address], [place], [place] [address]    Culture   Final    NO GROWTH 2 DAYS Performed at [facility] [address], [place], [place] [address]    Report Status PENDING  Incomplete  Culture, blood (routine x 2)     Status: None (Preliminary result)   Collection Time: 04/15/18  2:33 PM  Result Value Ref Range Status   Specimen Description   Final    BLOOD RIGHT HAND Performed at [facility] [address], [place], [place] [address]    Special Requests   Final    BOTTLES DRAWN AEROBIC AND ANAEROBIC Blood Culture adequate volume Performed at [facility] [address] [place],  [address]    Culture   Final    NO GROWTH 2 DAYS Performed at [facility] [address] [place],  [address]    Report Status PENDING  Incomplete  Surgical pcr screen     Status: None   Collection Time: 04/16/18  8:42 AM  Result Value Ref Range Status   MRSA,  PCR NEGATIVE NEGATIVE Final   Staphylococcus aureus NEGATIVE NEGATIVE Final    Comment: (NOTE) The Xpert SA Assay (FDA approved for NASAL specimens in patients 12 years of age and older), is one component of a comprehensive surveillance program. It is not intended to diagnose infection nor to guide or monitor treatment. Performed at [facility], [place] [address], [place], [place] [address]          Radiology Studies: [place] Ekg Site Rite  Result Date: 04/17/2018 If [facility] image not attached, placement could not be confirmed due to current cardiac rhythm.       Scheduled Meds: . calcium carbonate  1 tablet Oral Q breakfast  . Chlorhexidine Gluconate Cloth  6 each Topical Daily  .  feeding supplement  1 Container Oral TID BM  . hydrocortisone sod succinate (SOLU-CORTEF) inj  50 mg Intravenous Q12H  . mouth rinse  15 mL Mouth Rinse BID  . multivitamin with minerals  1 tablet Oral Daily   Continuous Infusions: . sodium chloride 10 mL/hr at 04/13/18 0800  . sodium chloride 250 mL (04/14/18 2317)  . cefTRIAXone (ROCEPHIN)  IV 2 g (04/17/18 1345)  . famotidine (PEPCID) IV 20 mg (04/17/18 3846)  . lactated ringers Stopped (04/16/18 0533)     LOS: 4 days     Cordelia Poche, MD [facility] 04/17/2018, 3:22 PM Pager: [phone]  If 7PM-7AM, please contact night-coverage [email or web address] 04/17/2018, 3:22 PM

## 2018-04-17 NOTE — Final Consult Note (Signed)
[facility] Progress Note  1 Day Post-Op  Subjective: CC: skin irritation Pt concerned about skin irritation from adhesive tape, would prefer only paper tape be used from this point on. Mild soreness at prior port site but not painful. Patient wants to get PICC as soon as she is able to avoid further peripheral sticks.   Objective: Vital signs in last 24 hours: Temp:  [97.5 F (36.4 C)-98.4 F (36.9 C)] 98.4 F (36.9 C) (08/23 0534) Pulse Rate:  [56-69] 63 (08/23 0534) Resp:  [13-20] 18 (08/23 0534) BP: (133-166)/(78-98) 133/94 (08/23 0534) SpO2:  [93 %-100 %] 96 % (08/23 0534) Weight:  [54.8 kg] 54.8 kg (08/23 0534) Last BM Date: 04/14/18  Intake/Output from previous day: 08/22 0701 - 08/23 0700 In: 2920.4 [P.O.:240; I.V.:2130.3; IV Piggyback:550.1] Out: 2230 [Urine:2225; Blood:5] Intake/Output this shift: No intake/output data recorded.  PE: Gen:  Alert, NAD, pleasant Card:  Regular rate and rhythm Pulm:  Normal effort, clear to auscultation bilaterally Abd: Soft, non-tender, non-distended Skin: warm and dry, incision c/d/i with steri-strips present, surrounding skin irritation from adhesive tape Psych: A&Ox3   Lab Results:  Recent Labs    04/15/18 0750 04/16/18 1414  WBC 10.2  --   HGB 10.0* 11.9*  HCT 30.0* 35.0*  PLT 282  --    BMET Recent Labs    04/16/18 0350 04/16/18 1414 04/16/18 1536  NA 125* 125* 127*  K 2.3* 3.7 3.5  CL 87*  --  90*  CO2 26  --  25  GLUCOSE 91  --  108*  BUN <5*  --  <5*  CREATININE 0.36*  --  0.37*  CALCIUM 7.5*  --  8.1*   PT/INR No results for input(s): LABPROT, INR in the last 72 hours. CMP     Component Value Date/Time   NA 127 (L) 04/16/2018 1536   K 3.5 04/16/2018 1536   CL 90 (L) 04/16/2018 1536   CO2 25 04/16/2018 1536   GLUCOSE 108 (H) 04/16/2018 1536   BUN <5 (L) 04/16/2018 1536   CREATININE 0.37 (L) 04/16/2018 1536   CREATININE 0.61 03/23/2018 0855   CREATININE 0.80 05/12/2017 1110   CALCIUM 8.1 (L) 04/16/2018 1536   CALCIUM 6.7 03/02/2018 1125   PROT 5.8 (L) 04/15/2018 0500   ALBUMIN 2.6 (L) 04/15/2018 0500   AST 34 04/15/2018 0500   AST 59 (H) 03/23/2018 0855   ALT 78 (H) 04/15/2018 0500   ALT 76 (H) 03/23/2018 0855   ALKPHOS 298 (H) 04/15/2018 0500   BILITOT 1.0 04/15/2018 0500   BILITOT 0.8 03/23/2018 0855   GFRNONAA >60 04/16/2018 1536   GFRNONAA >60 03/23/2018 0855   GFRNONAA 81 05/03/2016 0827   GFRAA >60 04/16/2018 1536   GFRAA >60 03/23/2018 0855   GFRAA >89 05/03/2016 0827   Lipase     Component Value Date/Time   LIPASE 26 04/12/2018 2151       Studies/Results: No results found.  Anti-infectives: Anti-infectives (From admission, onward)   Start     Dose/Rate Route Frequency Ordered Stop   04/13/18 2200  vancomycin (VANCOCIN) IVPB 1000 mg/200 mL premix  Status:  Discontinued     1,000 mg 200 mL/hr over 60 Minutes Intravenous Every 24 hours 04/13/18 0630 04/13/18 1151   04/13/18 1400  cefTRIAXone (ROCEPHIN) 2 g in sodium chloride 0.9 % 100 mL IVPB     2 g 200 mL/hr over 30 Minutes Intravenous Every 24 hours 04/13/18 1151     04/13/18 1000  ceFEPIme (MAXIPIME) 1 g in sodium chloride 0.9 % 100 mL IVPB  Status:  Discontinued     1 g 200 mL/hr over 30 Minutes Intravenous Every 12 hours 04/13/18 0319 04/13/18 1151   04/13/18 0600  metroNIDAZOLE (FLAGYL) IVPB 500 mg  Status:  Discontinued     500 mg 100 mL/hr over 60 Minutes Intravenous Every 8 hours 04/13/18 0319 04/14/18 1522   04/12/18 2130  ceFEPIme (MAXIPIME) 2 g in sodium chloride 0.9 % 100 mL IVPB     2 g 200 mL/hr over 30 Minutes Intravenous  Once 04/12/18 2128 04/12/18 2228   04/12/18 2130  metroNIDAZOLE (FLAGYL) IVPB 500 mg  Status:  Discontinued     500 mg 100 mL/hr over 60 Minutes Intravenous Every 8 hours 04/12/18 2128 04/13/18 0319   04/12/18 2130  vancomycin (VANCOCIN) IVPB 1000 mg/200 mL premix     1,000 mg 200 mL/hr over 60 Minutes Intravenous  Once 04/12/18 2128 04/12/18  2332       Assessment/Plan Metastatic small cell lung cancer Malignant obstructive jaundice Pathologic L2 fracture  Bacteremia - blood cxs from 8/18 grew out klebsiella and enterobacter - related to biliary obstruction and subsequent cholangitis - s/p ERCP biliary stent 8/20 - ID involved and guiding abx therapy, recommending port removal and insertion of PICC after 2 days line free S/p port removal 04/16/18 Dr. Kae Heller - incision c/d/i with steri-strips present, pt may shower from our standpoint - no f/u needed  FEN: reg diet VTE: SCDs ID: [email or web address] 8/19>>  We will sign off, please call with questions or concerns.  LOS: 4 days    Brigid Re , [facility] 04/17/2018, 8:02 AM Pager: [phone] Consults: [phone] Mon-Fri 7:00 am-4:30 pm Sat-Sun 7:00 am-11:30 am

## 2018-04-17 NOTE — Progress Notes (Signed)
[facility] for Infectious Disease    Date of Admission:  04/12/2018   Total days of antibiotics 6        Day 5 ceftriaxone           ID: Haley [name] is a 67 y.o. 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 Afebrile.  Medications:  . calcium carbonate  1 tablet Oral Q breakfast  . Chlorhexidine Gluconate Cloth  6 each Topical Daily  . feeding supplement  1 Container Oral TID BM  . hydrocortisone sod succinate (SOLU-CORTEF) inj  50 mg Intravenous Q12H  . mouth rinse  15 mL Mouth Rinse BID  . multivitamin with minerals  1 tablet Oral Daily    Objective: Vital signs in last 24 hours: Temp:  [97.5 F (36.4 C)-98.4 F (36.9 C)] 98.4 F (36.9 C) (08/23 0534) Pulse Rate:  [56-69] 63 (08/23 0534) Resp:  [13-20] 18 (08/23 0534) BP: (133-166)/(78-98) 133/94 (08/23 0534) SpO2:  [93 %-100 %] 96 % (08/23 0534) Weight:  [54.8 kg] 54.8 kg (08/23 0534) Physical Exam  Constitutional:  oriented to person, place, and time. appears well-developed and well-nourished. No distress.  HENT: [place]/AT, PERRLA, no scleral icterus Mouth/Throat: Oropharynx is clear and moist. No oropharyngeal exudate.  Cardiovascular: Normal rate, regular rhythm and normal heart sounds. Exam reveals no gallop and no friction rub.  No murmur heard.  Chest wall =  steristrip bandage, no surrounding fluctuance or erythema Pulmonary/Chest: Effort normal and breath sounds normal. No respiratory distress.  has no wheezes.  Neck = supple, no nuchal rigidity Abdominal: Soft. Bowel sounds are normal.  exhibits no distension. There is no tenderness.  Lymphadenopathy: no cervical adenopathy. No axillary adenopathy Neurological: alert and oriented to person, place, and time.  Skin: Skin is warm and dry. No rash noted. No erythema.  Psychiatric: a normal mood and affect.  behavior is normal.      Lab Results Recent Labs    04/15/18 0750 04/16/18 0350 04/16/18 1414 04/16/18 1536 04/17/18 0959  WBC 10.2  --   --   --  6.3  HGB 10.0*  --  11.9*  --  11.7*  HCT 30.0*  --  35.0*  --  34.3*  NA  --  125* 125* 127*  --   K  --  2.3* 3.7 3.5  --   CL  --  87*  --  90*  --   CO2  --  26  --  25  --   BUN  --  <5*  --  <5*  --   CREATININE  --  0.36*  --  0.37*  --    Liver Panel Recent Labs    04/15/18 0500  PROT 5.8*  ALBUMIN 2.6*  AST 34  ALT 78*  ALKPHOS 298*  BILITOT 1.0  BILIDIR 0.4*  IBILI 0.6  Microbiology: 8/21 blood cx ngtd at 22hr Studies/Results: No results found.   Assessment/Plan: Klebsiella bacteremia 2/2 cholangitis = plan to treat with ceftriaxone 2gm IV daily x 14d. Currently on day 4, needs 10 more days of treatment.  Bacteremia has cleared on recent blood cx, ok to place picc line -for tomorrow  Will place opat order  Will follow up in the id clinic in 2 wk ------------------------------ Diagnosis: bacteremia  Culture Result: klebsiella  No Known Allergies  OPAT Orders Discharge antibiotics: Per pharmacy protocol  Ceftriaxone 2gm iv daily  Duration: 10 days End Date: Sep 3  [place] Per Protocol:  Labs weekly while on IV antibiotics: _x_ CBC with differential  _x  CMP   _X_ Please leave PIC in place until doctor has seen patient or been notified  Fax weekly labs to [phone]  Clinic  Follow Up Appt: 2 wk  @ [facility] for Infectious Diseases Cell: [phone] Pager: [phone]  04/17/2018, 12:43 PM

## 2018-04-17 NOTE — Progress Notes (Signed)
Spoke with patient at bedside. States she does not anticipate needing IV antibiotics at home, PICC is being place to use for chemo. She indicates that the cancer center will assist with care of the PICC at d/c. She lives with family who will assist with wound care as needed. She declines any HH services at this time. [phone]

## 2018-04-17 NOTE — Progress Notes (Addendum)
PHARMACY CONSULT NOTE FOR:  OUTPATIENT  PARENTERAL ANTIBIOTIC THERAPY (OPAT)  Indication: Klebsiella bacteremia 2/2 cholangitis  Regimen: ceftriaxone 2gm IV daily x 14d End date: 04/28/18  IV antibiotic discharge orders are pended. To discharging provider:  please sign these orders via discharge navigator,  Select New Orders & click on the button choice - Manage This Unsigned Work.     Thank you for allowing pharmacy to be a part of this patient's care.  Gretta Arab PharmD, BCPS Pager [phone] 04/17/2018 12:56 PM   8/23 1800 OPAT orders adjusted for last dose 9/3 PICC line to be placed 8/24  Minda Ditto PharmD Pager [phone] 04/17/2018, 7:47 PM

## 2018-04-18 DIAGNOSIS — E43 Unspecified severe protein-calorie malnutrition: Secondary | ICD-10-CM

## 2018-04-18 DIAGNOSIS — C7951 Secondary malignant neoplasm of bone: Secondary | ICD-10-CM

## 2018-04-18 DIAGNOSIS — E279 Disorder of adrenal gland, unspecified: Secondary | ICD-10-CM

## 2018-04-18 LAB — BASIC METABOLIC PANEL
Anion gap: 6 (ref 5–15)
Anion gap: 10 (ref 5–15)
BUN: <5 mg/dL — ABNORMAL LOW (ref 8–23)
BUN: 6 mg/dL — ABNORMAL LOW (ref 8–23)
CALCIUM: 7.2 mg/dL — AB (ref 8.9–10.3)
CALCIUM: 7.7 mg/dL — AB (ref 8.9–10.3)
CHLORIDE: 96 mmol/L — AB (ref 98–111)
CHLORIDE: 94 mmol/L — AB (ref 98–111)
CO2: 23 mmol/L (ref 22–32)
CO2: 26 mmol/L (ref 22–32)
CREATININE: 0.31 mg/dL — AB (ref 0.44–1.00)
CREATININE: 0.44 mg/dL (ref 0.44–1.00)
GFR calc non Af Amer: >60 mL/min (ref >60)
Glucose, Bld: 74 mg/dL (ref 70–99)
Glucose, Bld: 89 mg/dL (ref 70–99)
Potassium: 4 mmol/L (ref 3.5–5.1)
Potassium: 3.5 mmol/L (ref 3.5–5.1)
SODIUM: 125 mmol/L — AB (ref 135–145)
SODIUM: 130 mmol/L — AB (ref 135–145)

## 2018-04-18 MED ORDER — SODIUM CHLORIDE 0.9% FLUSH
10.0000 mL | INTRAVENOUS | Status: DC | PRN
  Administered 2018-04-18: 10 mL
  Filled 2018-04-18: qty 40

## 2018-04-18 MED ORDER — CEFTRIAXONE IV (FOR PTA / DISCHARGE USE ONLY): 2.0000 g | INTRAVENOUS | 0 refills | Status: AC

## 2018-04-18 MED ORDER — HEPARIN SOD (PORK) LOCK FLUSH 100 UNIT/ML IV SOLN
250.0000 [IU] | INTRAVENOUS | Status: AC | PRN
  Administered 2018-04-18: 250 [IU]

## 2018-04-18 MED ORDER — FUROSEMIDE 10 MG/ML IJ SOLN
40.0000 mg | Freq: Every day | INTRAMUSCULAR | Status: DC
  Administered 2018-04-18: 40 mg via INTRAVENOUS
  Filled 2018-04-18: qty 4

## 2018-04-18 NOTE — Discharge Summary (Signed)
Physician Discharge Summary  Haley [name] IWO:[id number] DOB: 12-10-1950 DOA: 04/12/2018  PCP: Alycia Rossetti, MD  Admit date: 04/12/2018 Discharge date: 04/18/2018  Admitted From: Home Disposition: Home  Recommendations for Outpatient Follow-up:  1. Follow up with PCP in 1 week 2. Follow up with infectious disease in 2 weeks 3. Follow up with Gastroenterology in 2 weeks 4. Please obtain BMP/CBC in one week 5. Please follow up on the following pending results: Cath tip culture (8/22), repeat blood cultures (8/21)  Home Health: RN Equipment/Devices: None  Discharge Condition: Stable CODE STATUS: Full code Diet recommendation: Regular diet   Brief/Interim Summary:  Admission HPI written by Renee Pain, MD   HISTORY OF PRESENT ILLNESS This 67 y.o. Caucasian female reformed smoker presented to the [facility] Emergency Department via EMS with complaints of acute onset (over the past day) abdominal pain, altered mental status, fever and weakness. The patient reports that she has been struggling with abdominal pain.  She has diffuse abdominal tenderness (equivocal rebound tenderness) and mild rigidity.  She reports that she has had rapid progression of abdominal pain throughout the course of the day today. She became acutely weak approximately an hour ago and was brought to the ED via EMS.   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  CT of the abdomen and pelvis obtained today in the emergency department raises concern guarding the patency of the stent with increased biliary ductal dilatation.  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  Her last chemotherapy was 2 weeks ago.   Hospital course:  Sepsis Secondary to cholangitis and pseudomonas bacteremia. Physiology improved.  Klebsiella pneumoniae bacteremia Likely biliary source. Complicated by  patient's Portacath. Infectious disease consulted and recommended removal of port. Cath tip culture (8/22) sent. Patient discharge on ceftriaxone to complete a 10 day course.   Malignant obstructive jaundice Patient with with liver metastasis and pancreatic head metastasis. Recently completed a round of chemotherapy. S/p ERCP on 8/20 with metal biliary stent placement. Outpatient follow-up.  Hyponatremia/hypokalemia/hypomagnesemia/hypophosphatemia Likely complicated by history of adrenal insufficiency but potassium low. Sodium worsened with IV fluids. Fluids discontinued and patient's sodium improved. Given one dose of lasix, with further improvement of sodium. Potassium, magnesium and phosphorus repleted.  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 Continue Solu-cortef at current dose  Nausea Zofran prn  Discharge Diagnoses:  Principal Problem:   Sepsis ([place]) Active Problems:   Malignant obstructive jaundice (HCC)   Liver metastases (HCC)   Extensive stage primary small cell carcinoma of lung (HCC)   Bone metastasis (HCC)   Abnormal liver function   Generalized abdominal pain   Abnormal head CT   Adrenal nodule (HCC)   Hyponatremia   Hyperammonemia (HCC)   Lactic acidosis   Thrombocytosis (HCC)   Hypotension   Protein-calorie malnutrition, severe   Bacteremia due to Pseudomonas    Discharge Instructions  Discharge Instructions    Home infusion instructions [facility] May follow [place] Dosing Protocol; May administer Cathflo as needed to maintain patency of vascular access device.; Flushing of vascular access device: per [facility] Protocol: 0.9% NaCl pre/post medica...   Complete by:  As directed    Instructions:  May follow [place] Dosing Protocol   Instructions:  May administer Cathflo as needed to maintain patency of vascular access device.  Instructions:  Flushing of vascular access device: per [facility] Protocol: 0.9% NaCl pre/post medication administration and prn patency; Heparin 100 u/ml, 27m for implanted ports and Heparin 10u/ml, 560mfor all other central venous catheters.   Instructions:  May follow [facility] Anaphylaxis Protocol for First Dose Administration in the home: 0.9% NaCl at 25-50 ml/hr to maintain IV access for protocol meds. Epinephrine 0.3 ml IV/IM PRN and Benadryl 25-50 IV/IM PRN s/s of anaphylaxis.   Instructions:  [facility] Coordinator (RN) to assist per patient IV care needs in the home PRN.   Increase activity slowly   Complete by:  As directed      Allergies as of 04/18/2018   No Known Allergies     Medication List    TAKE these medications   calcium carbonate 1250 (500 Ca) MG tablet Commonly known as:  OS-CAL - dosed in mg of elemental calcium Take 1 tablet by mouth daily.   cefTRIAXone  IVPB Commonly known as:  ROCEPHIN Inject 2 g into the vein daily for 10 days. Indication: Klebsiella bacteremia 2/2 cholangitis  Last Day of Therapy:  04/27/18 Labs - Once weekly:  CBC/D and BMP, Labs - Every other week:  ESR and CRP   hydrocortisone 10 MG tablet Commonly known as:  CORTEF Take 2 tablets (20 mg total) by mouth daily with breakfast. And 1030mo daily with lunch. What changed:    how much to take  when to take this  additional instructions   lidocaine-prilocaine cream Commonly known as:  EMLA Apply to affected area once   LORazepam 0.5 MG tablet Commonly known as:  ATIVAN Take 1 tablet (0.5 mg total) by mouth every 6 (six) hours as needed (Nausea or vomiting).   magic mouthwash w/lidocaine Soln Take 10 mLs by mouth 4 (four) times daily as needed for mouth pain. Rinse and spit or swallow   metoCLOPramide 10 MG tablet Commonly known as:  REGLAN Take 1 tablet (10 mg total) by mouth every 8 (eight) hours as needed for nausea.   multivitamin with minerals tablet Take 1 tablet by  mouth daily.   ondansetron 4 MG tablet Commonly known as:  ZOFRAN Take 1 tablet (4 mg total) by mouth every 8 (eight) hours as needed for nausea or vomiting.   oxyCODONE 5 MG immediate release tablet Commonly known as:  Oxy IR/ROXICODONE Take 1-2 tablets (5-10 mg total) by mouth every 4 (four) hours as needed for moderate pain or severe pain.   polyethylene glycol packet Commonly known as:  MIRALAX / GLYCOLAX Take 17 g by mouth daily as needed for moderate constipation.   prochlorperazine 10 MG tablet Commonly known as:  COMPAZINE Take 1 tablet (10 mg total) by mouth every 6 (six) hours as needed (Nausea or vomiting).   senna-docusate 8.6-50 MG tablet Commonly known as:  Senokot-S Take 2 tablets by mouth at bedtime. What changed:    when to take this  reasons to take this   sucralfate 1 GM/10ML suspension Commonly known as:  CARAFATE Take 10 mLs (1 g total) by mouth 3 (three) times daily. With meals            Home Infusion Instuctions  (From admission, onward)         Start     Ordered   04/18/18 0000  Home infusion instructions [facility] May follow [facility] Protocol; May administer Cathflo as needed to maintain patency of vascular access device.; Flushing of vascular access device:  per [facility] Protocol: 0.9% NaCl pre/post medica...    Question Answer Comment  Instructions May follow [place] Dosing Protocol   Instructions May administer Cathflo as needed to maintain patency of vascular access device.   Instructions Flushing of vascular access device: per [facility] Protocol: 0.9% NaCl pre/post medication administration and prn patency; Heparin 100 u/ml, 51m for implanted ports and Heparin 10u/ml, 566mfor all other central venous catheters.   Instructions May follow [facility] Anaphylaxis Protocol for First Dose Administration in the home: 0.9% NaCl at 25-50 ml/hr to maintain IV access for protocol meds. Epinephrine 0.3 ml IV/IM PRN and Benadryl 25-50 IV/IM PRN  s/s of anaphylaxis.   Instructions [facility] Infusion Coordinator (RN) to assist per patient IV care needs in the home PRN.      04/18/18 1534         Follow-up InArlingtonKaModena NunneryMD. Schedule an appointment as soon as possible for a visit in 1 week(s).   Specialty:  Family Medicine Contact information: [address] [phone]        SnCarlyle BasquesMD. Schedule an appointment as soon as possible for a visit in 2 week(s).   Specialty:  Infectious Diseases Why:  Bacteremia follow-up Contact information: [address] [phone]        KaRonnette JuniperMD. Schedule an appointment as soon as possible for a visit in 2 week(s).   Specialty:  Gastroenterology Why:  Stent placement Contact information: [address] [phone]        No Known Allergies  Consultations:  Gastroenterology  Infectious disease  General surgery   Procedures/Studies: Ct Head Wo Contrast  Result Date: 04/12/2018 CLINICAL DATA:  History of liver cancer and small cell lung cancer. Last chemotherapy 2 weeks ago. Abdominal pain and altered mental status. EXAM: CT HEAD WITHOUT CONTRAST TECHNIQUE: Contiguous axial images were obtained from the base of the skull through the vertex without intravenous contrast. COMPARISON:  02/24/2018 FINDINGS: Brain: No mass effect or midline shift. No abnormal extra-axial fluid collections. No ventricular dilatation. There is vague low-attenuation change in the left anterior frontal white matter. This is similar to previous study and likely represents an area of small vessel ischemic change. If there is high clinical suspicion for intracranial metastasis, then contrast-enhanced MR would be the more sensitive examination. Gray-white matter junctions are distinct. Basal cisterns are not effaced. No acute intracranial hemorrhage. Vascular:  Mild intracranial arterial calcifications are present. Skull: Calvarium appears intact. No acute depressed skull fractures or destructive bone lesions identified. Sinuses/Orbits: There is an air-fluid level in the left sphenoid sinus, similar previous study. This is likely inflammatory. Paranasal sinuses and mastoid air cells are otherwise clear. Other: None. IMPRESSION: No acute intracranial abnormalities. Focal low-attenuation change in the left anterior frontal white matter is similar to previous study and likely represents an area of small vessel ischemic change. If there is high clinical suspicion for intracranial metastasis, then contrast-enhanced MR would be the more sensitive examination. Electronically Signed   By: WiLucienne Capers.D.   On: 04/12/2018 23:24   Ct Abdomen Pelvis W Contrast  Result Date: 04/12/2018 CLINICAL DATA:  Liver cancer abdominal pain EXAM: CT ABDOMEN AND PELVIS WITH CONTRAST TECHNIQUE: Multidetector CT imaging of the abdomen and pelvis was performed using the standard protocol following bolus administration of intravenous contrast. CONTRAST:  8078mSOVUE-300 IOPAMIDOL (ISOVUE-300)  INJECTION 61% COMPARISON:  MRI 02/25/2018, PET-CT 12/26/2017, CT 05/23/2017 FINDINGS: Lower chest: Lung bases demonstrate tiny pleural effusions. Passive atelectasis in the posterior left lung base. Persistent collapse in the right lower lobe with occlusion of the bronchus as before. Heart size within normal limits. Coronary vascular calcification. Hepatobiliary: Increased intra hepatic biliary dilatation since prior study. Increased size of hypodense metastatic liver lesions. Index posterior right hepatic lobe lesion now measures 23 mm compared with 10 mm previously. A left lobe lesion measures 16 mm, compared with 9 mm previously. Decreased pneumobilia with only minimal foci of pneumobilia evident. No air visible within the biliary stent except at its duodenal termination. Minimal wall thickening of  the gallbladder. No calcified stones. Pancreas: No inflammatory changes.  No measurable mass. Spleen: Normal in size without focal abnormality. Adrenals/Urinary Tract: Right adrenal gland is within normal limits. Subtle increased nodularity of the left adrenal gland no hydronephrosis. The bladder is distended Stomach/Bowel: The stomach is nonenlarged. No dilated small bowel. No colon wall thickening. Vascular/Lymphatic: Moderate to marked aortic atherosclerosis. No aneurysmal dilatation. No definitive adenopathy. Reproductive: Uterus and bilateral adnexa are unremarkable. Other: Negative for free air. Tiny amount of free fluid in the pelvis. Musculoskeletal: Extensive skeletal metastatic disease. Vertically oriented fracture through L2 with retropulsion and moderate canal stenosis as before. IMPRESSION: 1. Biliary stent in place. Overall decreased pneumobilia since prior CT with minimal foci of gas present. Interval increase in intra hepatic biliary dilatation; constellation of findings is concerning for stent occlusion. 2. Increased size of hepatic metastatic lesions consistent with disease progression. 3. Right infrahilar soft tissue density/presumed mass and collapse of the right lower lobe as before. Trace pleural effusion 4. Subtle increased nodularity of the left adrenal gland is also concerning for metastatic disease. 5. Widespread skeletal metastatic disease with pathologic L2 fracture, retropulsion, and canal stenosis as noted on the prior exams. Electronically Signed   By: Donavan Foil M.D.   On: 04/12/2018 23:40   Dg Chest Port 1 View  Result Date: 04/12/2018 CLINICAL DATA:  Fever and cough EXAM: PORTABLE CHEST 1 VIEW COMPARISON:  03/01/2018, 12/19/2017 FINDINGS: Right-sided central venous port tip overlies the cavoatrial region. No acute airspace disease or effusion. Stable cardiomediastinal silhouette. No pneumothorax. Minimal atelectasis or scar at the left base. IMPRESSION: No active disease.  Electronically Signed   By: Donavan Foil M.D.   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 Imaging suggestion of potential relative obstruction of the bile duct stent. EXAM: ERCP TECHNIQUE: Multiple spot images obtained with the fluoroscopic device and submitted for interpretation post-procedure. COMPARISON:  CT of the abdomen on 04/12/2018 FINDINGS: C-arm imaging during the endoscopic procedure demonstrates balloon dilatation of the indwelling metallic common bile duct stent. During the procedure there appears to be placement of an overlapping metallic stent. Occlusion cholangiogram shows opacification of the biliary tree proximal to the stented segment. IMPRESSION: Imaging obtained during balloon dilatation of an indwelling metallic common bile duct stent and placement of new overlapping metallic biliary stent. These images were submitted for radiologic interpretation only. Please see the procedural report for the amount of contrast and the fluoroscopy time utilized. Electronically Signed   By: Aletta Edouard M.D.   On: 04/15/2018 07:56   [place] Ekg Site Rite  Result Date: 04/17/2018 If Site Rite image not attached, placement could not be confirmed due  to current cardiac rhythm.    Subjective: No issues today.  Discharge Exam: Vitals:   04/17/18 2129 04/18/18 0526  BP: 103/75 129/70  Pulse: 68 60  Resp: 18 18  Temp: 98 F (36.7 C) 98.2 F (36.8 C)  SpO2: 96% 95%   Vitals:   04/17/18 0534 04/17/18 1417 04/17/18 2129 04/18/18 0526  BP: (!) 133/94 109/74 103/75 129/70  Pulse: 63 78 68 60  Resp: _0 Temp: 98.4 F (36.9 C) 98 F (36.7 C) 98 F (36.7 C) 98.2 F (36.8 C)  TempSrc: Oral Oral Oral Oral  SpO2: 96% 98% 96% 95%  Weight: 54.8 kg   57.5 kg  Height:        General: Pt is alert, awake, not in acute  distress Cardiovascular: RRR, S1/S2 +, no rubs, no gallops Respiratory: CTA bilaterally, no wheezing, no rhonchi Abdominal: Soft, NT, ND, bowel sounds + Extremities: no edema, no cyanosis    The results of significant diagnostics from this hospitalization (including imaging, microbiology, ancillary and laboratory) are listed below for reference.     Microbiology: Recent Results (from the past 240 hour(s))  Blood Culture (routine x 2)     Status: Abnormal   Collection Time: 04/12/18  9:27 PM  Result Value Ref Range Status   Specimen Description   Final    BLOOD LEFT ANTECUBITAL Performed at [facility] [address], [place], [place] [address]    Special Requests   Final    BOTTLES DRAWN AEROBIC AND ANAEROBIC Blood Culture adequate volume Performed at [facility] [address], [place], [place] [address]    Culture  Setup Time   Final    GRAM NEGATIVE RODS IN BOTH AEROBIC AND ANAEROBIC BOTTLES CRITICAL RESULT CALLED TO, READ BACK BY AND VERIFIED WITH: Guadlupe Spanish PharmD 11:35 04/13/18 ([email or web address]) Performed at [facility], [place] [address], [place], [place] [address]    Culture KLEBSIELLA PNEUMONIAE (A)  Final   Report Status 04/15/2018 FINAL  Final   Organism ID, Bacteria KLEBSIELLA PNEUMONIAE  Final      Susceptibility   Klebsiella pneumoniae - MIC*    AMPICILLIN >=32 RESISTANT Resistant     CEFAZOLIN <=4 SENSITIVE Sensitive     CEFEPIME <=1 SENSITIVE Sensitive     CEFTAZIDIME <=1 SENSITIVE Sensitive     CEFTRIAXONE <=1 SENSITIVE Sensitive     CIPROFLOXACIN <=0.25 SENSITIVE Sensitive     GENTAMICIN <=1 SENSITIVE Sensitive     IMIPENEM <=0.25 SENSITIVE Sensitive     TRIMETH/SULFA <=20 SENSITIVE Sensitive     AMPICILLIN/SULBACTAM 8 SENSITIVE Sensitive     PIP/TAZO <=4 SENSITIVE Sensitive     Extended ESBL NEGATIVE Sensitive     * KLEBSIELLA PNEUMONIAE  Blood Culture ID Panel (Reflexed)     Status: Abnormal   Collection Time:  04/12/18  9:27 PM  Result Value Ref Range Status   Enterococcus species NOT DETECTED NOT DETECTED Final   Listeria monocytogenes NOT DETECTED NOT DETECTED Final   Staphylococcus species NOT DETECTED NOT DETECTED Final   Staphylococcus aureus NOT DETECTED NOT DETECTED Final   Streptococcus species NOT DETECTED NOT DETECTED Final   Streptococcus agalactiae NOT DETECTED NOT DETECTED Final   Streptococcus pneumoniae NOT DETECTED NOT DETECTED Final   Streptococcus pyogenes NOT DETECTED NOT DETECTED Final   Acinetobacter baumannii NOT DETECTED NOT DETECTED Final   Enterobacteriaceae species DETECTED (A) NOT DETECTED Final    Comment: Enterobacteriaceae represent a large family of  gram-negative bacteria, not a single organism. CRITICAL RESULT CALLED TO, READ BACK BY AND VERIFIED WITH: Guadlupe Spanish PharmD 11:35 04/13/18 ([email or web address])    Enterobacter cloacae complex NOT DETECTED NOT DETECTED Final   Escherichia coli NOT DETECTED NOT DETECTED Final   Klebsiella oxytoca NOT DETECTED NOT DETECTED Final   Klebsiella pneumoniae DETECTED (A) NOT DETECTED Final    Comment: CRITICAL RESULT CALLED TO, READ BACK BY AND VERIFIED WITH: Guadlupe Spanish PharmD 11:35 04/13/18 ([email or web address])    Proteus species NOT DETECTED NOT DETECTED Final   Serratia marcescens NOT DETECTED NOT DETECTED Final   Carbapenem resistance NOT DETECTED NOT DETECTED Final   Haemophilus influenzae NOT DETECTED NOT DETECTED Final   Neisseria meningitidis NOT DETECTED NOT DETECTED Final   Pseudomonas aeruginosa NOT DETECTED NOT DETECTED Final   Candida albicans NOT DETECTED NOT DETECTED Final   Candida glabrata NOT DETECTED NOT DETECTED Final   Candida krusei NOT DETECTED NOT DETECTED Final   Candida parapsilosis NOT DETECTED NOT DETECTED Final   Candida tropicalis NOT DETECTED NOT DETECTED Final    Comment: Performed at [facility], [place] [address] [place], [place] [address]  Blood Culture (routine x 2)     Status: Abnormal   Collection  Time: 04/12/18 10:17 PM  Result Value Ref Range Status   Specimen Description   Final    BLOOD LEFT ANTECUBITAL Performed at [facility] [address], [place], [place] [address]    Special Requests   Final    BOTTLES DRAWN AEROBIC AND ANAEROBIC Blood Culture adequate volume Performed at [facility] [address] [place], [place] [address]    Culture  Setup Time   Final    GRAM NEGATIVE RODS IN BOTH AEROBIC AND ANAEROBIC BOTTLES CRITICAL VALUE NOTED.  VALUE IS CONSISTENT WITH PREVIOUSLY REPORTED AND CALLED VALUE.    Culture (A)  Final    KLEBSIELLA PNEUMONIAE SUSCEPTIBILITIES PERFORMED ON PREVIOUS CULTURE WITHIN THE LAST 5 DAYS. Performed at [facility], [place] [address] [place], [place] [address]    Report Status 04/15/2018 FINAL  Final  MRSA PCR Screening     Status: None   Collection Time: 04/13/18  4:43 AM  Result Value Ref Range Status   MRSA by PCR NEGATIVE NEGATIVE Final    Comment:        The GeneXpert MRSA Assay (FDA approved for NASAL specimens only), is one component of a comprehensive MRSA colonization surveillance program. It is not intended to diagnose MRSA infection nor to guide or monitor treatment for MRSA infections. Performed at [facility], [place] [address], [place], [place] [address]   Culture, Urine     Status: None   Collection Time: 04/13/18 12:49 PM  Result Value Ref Range Status   Specimen Description   Final    URINE, CLEAN CATCH Performed at [facility], [place] [address], [place], [place] [address]    Special Requests   Final    Immunocompromised Performed at [facility], [place] [address] [place], [place] [address]    Culture   Final    NO GROWTH Performed at [facility], [place] [address] [place], [place] [address]    Report Status 04/14/2018 FINAL  Final  Culture, blood (routine x 2)     Status: None (Preliminary result)    Collection Time: 04/15/18  2:30 PM  Result Value Ref Range Status   Specimen Description   Final  BLOOD RIGHT ANTECUBITAL Performed at [facility] [address] [place], [place] [address]    Special Requests   Final    BOTTLES DRAWN AEROBIC AND ANAEROBIC Blood Culture adequate volume Performed at [facility] [address], [place], [place] [address]    Culture   Final    NO GROWTH 3 DAYS Performed at [facility], [place] [address] [place], [place] [address]    Report Status PENDING  Incomplete  Culture, blood (routine x 2)     Status: None (Preliminary result)   Collection Time: 04/15/18  2:33 PM  Result Value Ref Range Status   Specimen Description   Final    BLOOD RIGHT HAND Performed at [facility] [address], [place], [place] [address]    Special Requests   Final    BOTTLES DRAWN AEROBIC AND ANAEROBIC Blood Culture adequate volume Performed at [facility] [address], [place], [place] [address]    Culture   Final    NO GROWTH 3 DAYS Performed at [facility], [place] [address], [place], [place] [address]    Report Status PENDING  Incomplete  Surgical pcr screen     Status: None   Collection Time: 04/16/18  8:42 AM  Result Value Ref Range Status   MRSA, PCR NEGATIVE NEGATIVE Final   Staphylococcus aureus NEGATIVE NEGATIVE Final    Comment: (NOTE) The Xpert SA Assay (FDA approved for NASAL specimens in patients 43 years of age and older), is one component of a comprehensive surveillance program. It is not intended to diagnose infection nor to guide or monitor treatment. Performed at [facility], [place] [address] [place],  [address]      Labs: BNP (last 3 results) No results for input(s): BNP in the last 8760 hours. Basic Metabolic Panel: Recent Labs  Lab 04/13/18 0324 04/13/18 0631 04/13/18 0640  04/15/18 0500  04/17/18 0959  04/17/18 1651 04/17/18 2152 04/18/18 0511 04/18/18 1432  NA 129* 131*  --    < > 129*   < > 124* 124* 123* 125* 130*  K 2.6* 2.6*  --    < > 3.7   < > 3.4* 2.8* 3.2* 4.0 3.5  CL 97* 97*  --    < > 97*   < > 91* 91* 91* 96* 94*  CO2 22 21*  --    < > 22   < > _0 GLUCOSE 134* 116*  --    < > 109*   < > 106* 152* 195* 74 89  BUN 7* 7*  --    < > 6*   < > 7* 7* 6* <5* 6*  CREATININE <0.30* 0.33*  --    < > 0.36*   < > <0.30* 0.33* 0.38* 0.31* 0.44  CALCIUM 7.8* 7.4*  --    < > 7.5*   < > 7.7* 7.5* 7.4* 7.2* 7.7*  MG  --  1.4*  --   --  1.8  --   --  1.7  --   --   --   PHOS 1.9* 2.6 1.8*  --   --   --   --   --   --   --   --    < > = values in this interval not displayed.   Liver Function Tests: Recent Labs  Lab 04/12/18 2151 04/13/18 0324 04/13/18 0631  04/14/18 0410 04/15/18 0500  AST 282*  --   --  77* 34  ALT 204*  --   --  114* 78*  ALKPHOS 519*  --   --  364* 298*  BILITOT 3.6*  --   --  1.2 1.0  PROT 7.0  --   --  5.6* 5.8*  ALBUMIN 3.1* 2.7* 2.4* 2.5* 2.6*   Recent Labs  Lab 04/12/18 2151  LIPASE 26   Recent Labs  Lab 04/12/18 2200  AMMONIA 58*   CBC: Recent Labs  Lab 04/12/18 2151 04/13/18 0307 04/14/18 0410 04/15/18 0750 04/16/18 1414 04/17/18 0959  WBC 13.8* 15.6* 16.2* 10.2  --  6.3  NEUTROABS  --   --  14.9* 8.6*  --   --   HGB 12.6 11.5* 9.9* 10.0* 11.9* 11.7*  HCT 36.7 33.7* 29.0* 30.0* 35.0* 34.3*  MCV 86.4 85.8 86.1 87.0  --  84.7  PLT 436* 318 324 282  --  396   Cardiac Enzymes: No results for input(s): CKTOTAL, CKMB, CKMBINDEX, TROPONINI in the last 168 hours. BNP: Invalid input(s): POCBNP CBG: No results for input(s): GLUCAP in the last 168 hours. D-Dimer No results for input(s): DDIMER in the last 72 hours. Hgb A1c No results for input(s): HGBA1C in the last 72 hours. Lipid Profile No results for input(s): CHOL, HDL, LDLCALC, TRIG, CHOLHDL, LDLDIRECT in the last 72 hours. Thyroid function studies No results for  input(s): TSH, T4TOTAL, T3FREE, THYROIDAB in the last 72 hours.  Invalid input(s): FREET3 Anemia work up No results for input(s): VITAMINB12, FOLATE, FERRITIN, TIBC, IRON, RETICCTPCT in the last 72 hours. Urinalysis    Component Value Date/Time   COLORURINE YELLOW 04/12/2018 2217   APPEARANCEUR CLEAR 04/12/2018 2217   LABSPEC 1.008 04/12/2018 2217   PHURINE 7.0 04/12/2018 2217   GLUCOSEU 50 (A) 04/12/2018 2217   HGBUR NEGATIVE 04/12/2018 2217   [place] NEGATIVE 04/12/2018 2217   KETONESUR 5 (A) 04/12/2018 2217   PROTEINUR NEGATIVE 04/12/2018 2217   NITRITE NEGATIVE 04/12/2018 2217   LEUKOCYTESUR NEGATIVE 04/12/2018 2217   Sepsis Labs Invalid input(s): PROCALCITONIN,  WBC,  LACTICIDVEN Microbiology Recent Results (from the past 240 hour(s))  Blood Culture (routine x 2)     Status: Abnormal   Collection Time: 04/12/18  9:27 PM  Result Value Ref Range Status   Specimen Description   Final    BLOOD LEFT ANTECUBITAL Performed at [facility], [place] [address] [place], [place] [address]    Special Requests   Final    BOTTLES DRAWN AEROBIC AND ANAEROBIC Blood Culture adequate volume Performed at [place] [address], [place], [place] [address]    Culture  Setup Time   Final    GRAM NEGATIVE RODS IN BOTH AEROBIC AND ANAEROBIC BOTTLES CRITICAL RESULT CALLED TO, READ BACK BY AND VERIFIED WITH: Guadlupe Spanish PharmD 11:35 04/13/18 ([email or web address]) Performed at [facility], [place] [address] [place], [place] [address]    Culture KLEBSIELLA PNEUMONIAE (A)  Final   Report Status 04/15/2018 FINAL  Final   Organism ID, Bacteria KLEBSIELLA PNEUMONIAE  Final      Susceptibility   Klebsiella pneumoniae - MIC*    AMPICILLIN >=32 RESISTANT Resistant     CEFAZOLIN <=4 SENSITIVE Sensitive     CEFEPIME <=1 SENSITIVE Sensitive     CEFTAZIDIME <=1 SENSITIVE Sensitive     CEFTRIAXONE <=1 SENSITIVE Sensitive     CIPROFLOXACIN <=0.25 SENSITIVE  Sensitive  GENTAMICIN <=1 SENSITIVE Sensitive     IMIPENEM <=0.25 SENSITIVE Sensitive     TRIMETH/SULFA <=20 SENSITIVE Sensitive     AMPICILLIN/SULBACTAM 8 SENSITIVE Sensitive     PIP/TAZO <=4 SENSITIVE Sensitive     Extended ESBL NEGATIVE Sensitive     * KLEBSIELLA PNEUMONIAE  Blood Culture ID Panel (Reflexed)     Status: Abnormal   Collection Time: 04/12/18  9:27 PM  Result Value Ref Range Status   Enterococcus species NOT DETECTED NOT DETECTED Final   Listeria monocytogenes NOT DETECTED NOT DETECTED Final   Staphylococcus species NOT DETECTED NOT DETECTED Final   Staphylococcus aureus NOT DETECTED NOT DETECTED Final   Streptococcus species NOT DETECTED NOT DETECTED Final   Streptococcus agalactiae NOT DETECTED NOT DETECTED Final   Streptococcus pneumoniae NOT DETECTED NOT DETECTED Final   Streptococcus pyogenes NOT DETECTED NOT DETECTED Final   Acinetobacter baumannii NOT DETECTED NOT DETECTED Final   Enterobacteriaceae species DETECTED (A) NOT DETECTED Final    Comment: Enterobacteriaceae represent a large family of gram-negative bacteria, not a single organism. CRITICAL RESULT CALLED TO, READ BACK BY AND VERIFIED WITH: Guadlupe Spanish PharmD 11:35 04/13/18 ([email or web address])    Enterobacter cloacae complex NOT DETECTED NOT DETECTED Final   Escherichia coli NOT DETECTED NOT DETECTED Final   Klebsiella oxytoca NOT DETECTED NOT DETECTED Final   Klebsiella pneumoniae DETECTED (A) NOT DETECTED Final    Comment: CRITICAL RESULT CALLED TO, READ BACK BY AND VERIFIED WITH: Guadlupe Spanish PharmD 11:35 04/13/18 ([email or web address])    Proteus species NOT DETECTED NOT DETECTED Final   Serratia marcescens NOT DETECTED NOT DETECTED Final   Carbapenem resistance NOT DETECTED NOT DETECTED Final   Haemophilus influenzae NOT DETECTED NOT DETECTED Final   Neisseria meningitidis NOT DETECTED NOT DETECTED Final   Pseudomonas aeruginosa NOT DETECTED NOT DETECTED Final   Candida albicans NOT DETECTED NOT DETECTED Final    Candida glabrata NOT DETECTED NOT DETECTED Final   Candida krusei NOT DETECTED NOT DETECTED Final   Candida parapsilosis NOT DETECTED NOT DETECTED Final   Candida tropicalis NOT DETECTED NOT DETECTED Final    Comment: Performed at [facility], [place] [address], [place], [place] [address]  Blood Culture (routine x 2)     Status: Abnormal   Collection Time: 04/12/18 10:17 PM  Result Value Ref Range Status   Specimen Description   Final    BLOOD LEFT ANTECUBITAL Performed at [place] [address], [place], [place] [address]    Special Requests   Final    BOTTLES DRAWN AEROBIC AND ANAEROBIC Blood Culture adequate volume Performed at [place] [address] [place], [place] [address]    Culture  Setup Time   Final    GRAM NEGATIVE RODS IN BOTH AEROBIC AND ANAEROBIC BOTTLES CRITICAL VALUE NOTED.  VALUE IS CONSISTENT WITH PREVIOUSLY REPORTED AND CALLED VALUE.    Culture (A)  Final    KLEBSIELLA PNEUMONIAE SUSCEPTIBILITIES PERFORMED ON PREVIOUS CULTURE WITHIN THE LAST 5 DAYS. Performed at [facility], [place] [address] [place], [place] [address]    Report Status 04/15/2018 FINAL  Final  MRSA PCR Screening     Status: None   Collection Time: 04/13/18  4:43 AM  Result Value Ref Range Status   MRSA by PCR NEGATIVE NEGATIVE Final    Comment:        The GeneXpert MRSA Assay (FDA approved for NASAL specimens only), is one component of a comprehensive MRSA colonization surveillance  program. It is not intended to diagnose MRSA infection nor to guide or monitor treatment for MRSA infections. Performed at [facility], [place] [address] [place], [place] [address]   Culture, Urine     Status: None   Collection Time: 04/13/18 12:49 PM  Result Value Ref Range Status   Specimen Description   Final    URINE, CLEAN CATCH Performed at [facility], [place] [address], [place], [place] [address]     Special Requests   Final    Immunocompromised Performed at [facility], [place] [address] [place], [place] [address]    Culture   Final    NO GROWTH Performed at [facility], [place] [address], [place], [place] [address]    Report Status 04/14/2018 FINAL  Final  Culture, blood (routine x 2)     Status: None (Preliminary result)   Collection Time: 04/15/18  2:30 PM  Result Value Ref Range Status   Specimen Description   Final    BLOOD RIGHT ANTECUBITAL Performed at [place] [address] [place], [place] [address]    Special Requests   Final    BOTTLES DRAWN AEROBIC AND ANAEROBIC Blood Culture adequate volume Performed at [place] [address], [place], [place] [address]    Culture   Final    NO GROWTH 3 DAYS Performed at [facility], [place] [address] [place], [place] [address]    Report Status PENDING  Incomplete  Culture, blood (routine x 2)     Status: None (Preliminary result)   Collection Time: 04/15/18  2:33 PM  Result Value Ref Range Status   Specimen Description   Final    BLOOD RIGHT HAND Performed at [place] [address], [place], [place] [address]    Special Requests   Final    BOTTLES DRAWN AEROBIC AND ANAEROBIC Blood Culture adequate volume Performed at [place] [address] [place], [place] [address]    Culture   Final    NO GROWTH 3 DAYS Performed at [facility], [place] [address], [place], [place] [address]    Report Status PENDING  Incomplete  Surgical pcr screen     Status: None   Collection Time: 04/16/18  8:42 AM  Result Value Ref Range Status   MRSA, PCR NEGATIVE NEGATIVE Final   Staphylococcus aureus NEGATIVE NEGATIVE Final    Comment: (NOTE) The Xpert SA Assay (FDA approved for NASAL specimens in patients 66 years of age and older), is one component of a comprehensive surveillance program. It is not intended to  diagnose infection nor to guide or monitor treatment. Performed at [facility], [place] [address], [place], [place] [address]      SIGNED:   Cordelia Poche, MD [facility] 04/18/2018, 4:00 PM

## 2018-04-18 NOTE — Progress Notes (Signed)
Peripherally Inserted Central Catheter/Midline Placement  The IV Nurse has discussed with the patient and/or persons authorized to consent for the patient, the purpose of this procedure and the potential benefits and risks involved with this procedure.  The benefits include less needle sticks, lab draws from the catheter, and the patient may be discharged home with the catheter. Risks include, but not limited to, infection, bleeding, blood clot (thrombus formation), and puncture of an artery; nerve damage and irregular heartbeat and possibility to perform a PICC exchange if needed/ordered by physician.  Alternatives to this procedure were also discussed.  Bard Power PICC patient education guide, fact sheet on infection prevention and patient information card has been provided to patient /or left at bedside.    PICC/Midline Placement Documentation  PICC Single Lumen 21/94/71 PICC Left Basilic 41 cm 0 cm (Active)  Indication for Insertion or Continuance of Line Home intravenous therapies (PICC only) 04/18/2018  9:26 AM  Exposed Catheter (cm) 0 cm 04/18/2018  9:26 AM  Site Assessment Clean;Dry;Intact 04/18/2018  9:26 AM  Line Status Blood return noted;Flushed;Saline locked 04/18/2018  9:26 AM  Dressing Type Transparent;Securing device 04/18/2018  9:26 AM  Dressing Status Clean;Dry;Intact;Antimicrobial disc in place 04/18/2018  9:26 AM  Dressing Intervention New dressing 04/18/2018  9:26 AM  Dressing Change Due 04/25/18 04/18/2018  9:26 AM       Aldona Lento L 04/18/2018, 9:45 AM

## 2018-04-18 NOTE — Progress Notes (Signed)
Spoke with RN re PICC placement to be done today d/t awaiting d/c home once PICC placed.

## 2018-04-18 NOTE — Discharge Instructions (Signed)
Carolan Shiver,

## 2018-04-18 NOTE — Progress Notes (Signed)
Received referral to assist with [facility] IV with [facility]. Per RN, IV antibiotic is due q 24 hrs and is due tomorrow at 14:00. Contacted Jermaine at [facility] for referral. He reports that pt is active with them for IV antibiotic. Informed Jermaine that the med is due tomorrow at 14:00.

## 2018-04-20 ENCOUNTER — Telehealth: Payer: Self-pay | Admitting: Family Medicine

## 2018-04-20 LAB — CULTURE, BLOOD (ROUTINE X 2)
Culture: NO GROWTH
Culture: NO GROWTH
SPECIAL REQUESTS: ADEQUATE
SPECIAL REQUESTS: ADEQUATE

## 2018-04-20 NOTE — Telephone Encounter (Signed)
Call placed to [place], [facility] SN with [facility].  Reports that patient is receiving IV ABTx from Dr. Evelina Bucy. States that patient is unable to give medication herself and caregivers will not be able to assist until Thursday.   Requested to increase frequency of visits while giving ABTx. VO given.   Of note, HH SN will also send copy of labs to [facility] for records.

## 2018-04-20 NOTE — Telephone Encounter (Signed)
Agree with above 

## 2018-04-20 NOTE — Telephone Encounter (Signed)
[facility] calling about this patient would like to get some orders  She is going there today please call her at [phone]

## 2018-04-21 ENCOUNTER — Other Ambulatory Visit: Payer: PRIVATE HEALTH INSURANCE

## 2018-04-21 ENCOUNTER — Ambulatory Visit: Payer: PRIVATE HEALTH INSURANCE | Admitting: Hematology

## 2018-04-23 ENCOUNTER — Encounter: Payer: Self-pay | Admitting: Family Medicine

## 2018-04-23 ENCOUNTER — Ambulatory Visit (INDEPENDENT_AMBULATORY_CARE_PROVIDER_SITE_OTHER): Payer: Medicare Other | Admitting: Family Medicine

## 2018-04-23 ENCOUNTER — Telehealth: Payer: Self-pay | Admitting: Behavioral Health

## 2018-04-23 ENCOUNTER — Telehealth: Payer: Self-pay | Admitting: *Deleted

## 2018-04-23 VITALS — BP 118/74 | HR 66 | Temp 98.1°F | Resp 20 | Wt 119.0 lb

## 2018-04-23 DIAGNOSIS — R300 Dysuria: Secondary | ICD-10-CM

## 2018-04-23 DIAGNOSIS — N3001 Acute cystitis with hematuria: Secondary | ICD-10-CM | POA: Diagnosis not present

## 2018-04-23 DIAGNOSIS — E440 Moderate protein-calorie malnutrition: Secondary | ICD-10-CM

## 2018-04-23 DIAGNOSIS — C787 Secondary malignant neoplasm of liver and intrahepatic bile duct: Secondary | ICD-10-CM

## 2018-04-23 LAB — URINALYSIS, ROUTINE W REFLEX MICROSCOPIC
BILIRUBIN URINE: NEGATIVE
GLUCOSE, UA: NEGATIVE
NITRITE: NEGATIVE
Specific Gravity, Urine: 1.034 (ref 1.001–1.03)
pH: 5.5 (ref 5.0–8.0)

## 2018-04-23 LAB — MICROSCOPIC MESSAGE

## 2018-04-23 MED ORDER — TRAMADOL HCL 50 MG PO TABS: 50.0000 mg | ORAL_TABLET | Freq: Four times a day (QID) | ORAL | 0 refills | Status: DC | PRN

## 2018-04-23 MED ORDER — POTASSIUM CHLORIDE CRYS ER 20 MEQ PO TBCR: 20.0000 meq | EXTENDED_RELEASE_TABLET | Freq: Every day | ORAL | 0 refills | Status: DC

## 2018-04-23 MED ORDER — SULFAMETHOXAZOLE-TRIMETHOPRIM 800-160 MG PO TABS: 1.0000 | ORAL_TABLET | Freq: Two times a day (BID) | ORAL | 0 refills | Status: DC

## 2018-04-23 NOTE — Telephone Encounter (Signed)
Received call from Ander Purpura [facility] SN with [facility] [phone]~ telephone.   Reports that patient caregiver is available at this time and will be administering the Rocephin infusions via PICC. States that nursing will be decreasing their frequency of visits at this time and patient will be discharged from [facility] services on 04/27/2018. Inquired as to whether SN services will be required after 04/27/2018 to remove PICC. Advised that any orders regarding PICC will need to come from Dr. Baxter Flattery, [place].   Verbalized understanding.   PCP and ID MD to be made aware.

## 2018-04-23 NOTE — Telephone Encounter (Signed)
Daleen Snook from [facility] called stating they received labs for Ms. Haley [name].  Potassium was 3.1.  They sent in Potassium replacement prescription for 7 days for patient to take.  Labs will be redrawn Monday and patient to follow up with Dr. Baxter Flattery 04/30/2018. Pricilla Riffle RN

## 2018-04-23 NOTE — Progress Notes (Signed)
Subjective:    Patient ID: Haley [name], female    DOB: [birth year], 67 y.o.   MRN: [id number]  HPI Patient has lung cancer with liver metastasis.  She also recently had cholangitis and is currently on IV Rocephin having recently been discharged from the hospital.  Over the last week, developed dysuria, urgency, frequency, hesitancy, weak urinary stream.  She denies any fevers or chills.  She is not eating very well.  She is not drinking very well.  I saw some lab work this morning that showed her sodium level was 129 and a potassium level was 3.1.  The patient appears jaundiced, cachectic, and dehydrated.  She is currently receiving Rocephin.  Urinalysis today shows: Office Visit on 04/23/2018  Component Date Value Ref Range Status  . Color, Urine 04/23/2018 YELLOW  YELLOW Final  . APPearance 04/23/2018 CLOUDY* CLEAR Final  . Specific Gravity, Urine 04/23/2018 1.034  1.001 - 1.03 Final   Comment: Verified by repeat analysis. .   . pH 04/23/2018 5.5  5.0 - 8.0 Final  . Glucose, UA 04/23/2018 NEGATIVE  NEGATIVE Final  . Bilirubin Urine 04/23/2018 NEGATIVE  NEGATIVE Final   Comment: Verified by repeat analysis. Tenna Child, ur 04/23/2018 1+* NEGATIVE Final  . Hgb urine dipstick 04/23/2018 TRACE* NEGATIVE Final  . Protein, ur 04/23/2018 2+* NEGATIVE Final  . Nitrite 04/23/2018 NEGATIVE  NEGATIVE Final  . Leukocytes, UA 04/23/2018 TRACE* NEGATIVE Final  . WBC, UA 04/23/2018 CANCELED   Final   Comment: Test not performed. Quantity not sufficient. .  Result canceled by the ancillary.   . Note 04/23/2018    Final   Comment: This urine was analyzed for the presence of WBC,  RBC, bacteria, casts, and other formed elements.  Only those elements seen were reported. . .    Past Medical History:  Diagnosis Date  . Allergy   . Back pain   . Osteopenia   . 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 Tubular adenoma of colon  10/26/2014   Past Surgical History:  Procedure Laterality Date  . BILIARY STENT PLACEMENT N/A 04/14/2018   Procedure: BILIARY STENT PLACEMENT;  Surgeon: [name], MD;  Location: [facility] ENDOSCOPY;  Service: Gastroenterology;  Laterality: N/A;  . COLONOSCOPY    . COLONOSCOPY WITH PROPOFOL N/A 10/25/2014   Procedure: COLONOSCOPY WITH PROPOFOL;  Surgeon: Garlan Fair, MD;  Location: [facility];  Service: Endoscopy;  Laterality: N/A;  . ERCP N/A 10/15/2017   Procedure: ENDOSCOPIC RETROGRADE CHOLANGIOPANCREATOGRAPHY (ERCP);  Surgeon: Clarene Essex, MD;  Location: [facility];  Service: Endoscopy;  Laterality: N/A;  . ERCP N/A 04/14/2018   Procedure: ENDOSCOPIC RETROGRADE CHOLANGIOPANCREATOGRAPHY (ERCP);  Surgeon: [name], MD;  Location: [facility];  Service: Gastroenterology;  Laterality: N/A;  with stent change and Balloon Sweep  . IR FLUORO GUIDE PORT INSERTION RIGHT  11/10/2017  . IR RADIOLOGIST EVAL & MGMT  10/02/2017  . IR US GUIDE VASC ACCESS RIGHT  11/10/2017  . PORT-A-CATH REMOVAL N/A 04/16/2018   Procedure: REMOVAL PORT-A-CATH;  Surgeon: Clovis Riley, MD;  Location: [facility] ORS;  Service: General;  Laterality: N/A;   Current Outpatient Medications on File Prior to Visit  Medication Sig Dispense Refill  . calcium carbonate (OS-CAL - DOSED IN MG OF ELEMENTAL CALCIUM) 1250 (500 Ca) MG tablet Take 1 tablet by mouth daily.     . cefTRIAXone (ROCEPHIN) IVPB Inject 2 g into the vein  daily for 10 days. Indication: Klebsiella bacteremia 2/2 cholangitis  Last Day of Therapy:  04/27/18 Labs - Once weekly:  CBC/D and BMP, Labs - Every other week:  ESR and CRP 10 Units 0  . hydrocortisone (CORTEF) 10 MG tablet Take 2 tablets (20 mg total) by mouth daily with breakfast. And 88m po daily with lunch. (Patient taking differently: Take 10-20 mg by mouth See admin instructions. 20 mg every morning And 129mwith lunch.) 90 tablet 1  . lidocaine-prilocaine (EMLA) cream Apply to affected area once 30 g 3  .  LORazepam (ATIVAN) 0.5 MG tablet Take 1 tablet (0.5 mg total) by mouth every 6 (six) hours as needed (Nausea or vomiting). 30 tablet 0  . magic mouthwash w/lidocaine SOLN Take 10 mLs by mouth 4 (four) times daily as needed for mouth pain. Rinse and spit or swallow 240 mL 2  . metoCLOPramide (REGLAN) 10 MG tablet Take 1 tablet (10 mg total) by mouth every 8 (eight) hours as needed for nausea. 45 tablet 1  . Multiple Vitamins-Minerals (MULTIVITAMIN WITH MINERALS) tablet Take 1 tablet by mouth daily.    . ondansetron (ZOFRAN) 4 MG tablet Take 1 tablet (4 mg total) by mouth every 8 (eight) hours as needed for nausea or vomiting. 20 tablet 0  . oxyCODONE (OXY IR/ROXICODONE) 5 MG immediate release tablet Take 1-2 tablets (5-10 mg total) by mouth every 4 (four) hours as needed for moderate pain or severe pain. 60 tablet 0  . polyethylene glycol (MIRALAX) packet Take 17 g by mouth daily as needed for moderate constipation. 30 each 0  . prochlorperazine (COMPAZINE) 10 MG tablet Take 1 tablet (10 mg total) by mouth every 6 (six) hours as needed (Nausea or vomiting). 30 tablet 1  . senna-docusate (SENNA S) 8.6-50 MG tablet Take 2 tablets by mouth at bedtime. (Patient taking differently: Take 2 tablets by mouth at bedtime as needed for mild constipation or moderate constipation. ) 60 tablet 1  . sucralfate (CARAFATE) 1 GM/10ML suspension Take 10 mLs (1 g total) by mouth 3 (three) times daily. With meals 840 mL 2   Current Facility-Administered Medications on File Prior to Visit  Medication Dose Route Frequency Provider Last Rate Last Dose  . acetaminophen (TYLENOL) tablet 650 mg  650 mg Oral Once MoCurt BearsMD      . diphenhydrAMINE (BENADRYL) capsule 25 mg  25 mg Oral Once MoCurt BearsMD       No Known Allergies Social History   Socioeconomic History  . Marital status: Single    Spouse name: Not on file  . Number of children: Not on file  . Years of education: Not on file  . Highest  education level: Not on file  Occupational History  . Not on file  Social Needs  . Financial resource strain: Not on file  . Food insecurity:    Worry: Not on file    Inability: Not on file  . Transportation needs:    Medical: Not on file    Non-medical: Not on file  Tobacco Use  . Smoking status: Former Smoker    Packs/day: 1.00    Years: 40.00    Pack years: 40.00    Types: Cigarettes    Last attempt to quit: 09/24/2017    Years since quitting: 0.5  . Smokeless tobacco: Never Used  Substance and Sexual Activity  . Alcohol use: Yes    Comment: rare social  . Drug use: No  . Sexual activity:  Not Currently  Lifestyle  . Physical activity:    Days per week: Not on file    Minutes per session: Not on file  . Stress: Not on file  Relationships  . Social connections:    Talks on phone: Not on file    Gets together: Not on file    Attends religious service: Not on file    Active member of club or organization: Not on file    Attends meetings of clubs or organizations: Not on file    Relationship status: Not on file  . Intimate partner violence:    Fear of current or ex partner: Not on file    Emotionally abused: Not on file    Physically abused: Not on file    Forced sexual activity: Not on file  Other Topics Concern  . Not on file  Social History Narrative  . Not on file      Review of Systems  All other systems reviewed and are negative.      Objective:   Physical Exam  Constitutional: She appears cachectic. She is active. She has a sickly appearance.  Cardiovascular: Normal rate, regular rhythm and normal heart sounds.  Pulmonary/Chest: Effort normal and breath sounds normal. No stridor. No respiratory distress. She has no wheezes. She has no rales.  Abdominal: Soft. Bowel sounds are normal.  Neurological: She is alert.          Assessment & Plan:  Dysuria - Plan: Urinalysis, Routine w reflex microscopic, Urine Culture  Acute cystitis with  hematuria  Liver metastases (HCC)  Malnutrition of moderate degree  Patient appears to have a mild urinary tract infection.  I will start Bactrim 1 tablet p.o. twice daily for 7 days and await the results of her urine culture.  Given the fact she is on Rocephin, I suspect a complicated hospital-acquired infection is resistant.  Therefore may need to tailor her antibiotics based on the culture and sensitivities.  I am very concerned about her dehydration.  I encouraged the patient to drink 2-3 Gatorade's a day to help treat her dehydration and also started her on potassium supplement K-Dur 20 milliequivalents a day recommended rechecking a BMP in 1 week to monitor her hypokalemia and dehydration.  Recheck sooner if worsening.  Also the patient wants to discontinue her oxycodone and switch to something that is milder that does not make her feel quite as sedated.  Therefore we will try tramadol 50 mg every 6 hours as needed for pain and discontinue oxycodone

## 2018-04-23 NOTE — Telephone Encounter (Signed)
Received labs from [facility] for Dr. Baxter Flattery, [place]. Noted K+ low at 3.1.  Fellow MD made aware and NO given for K+ 78mEq PO QD. Repeat labs in 1 week.   Call placed to patient and patient made aware. States that she has labs scheduled on Monday 04/27/2018 and F/U appt with Dr. Baxter Flattery on 04/30/2018.  PCP made aware and states that all orders will need to come from ID. Will continue to review labs.   Call placed to [facility] and made aware. Call placed to Dr. Storm Frisk office and recommendations given.

## 2018-04-24 LAB — URINE CULTURE
MICRO NUMBER:: 91035619
Result:: NO GROWTH
SPECIMEN QUALITY:: ADEQUATE

## 2018-04-24 NOTE — Telephone Encounter (Signed)
Agree, oncology and ID managing

## 2018-04-29 ENCOUNTER — Encounter (INDEPENDENT_AMBULATORY_CARE_PROVIDER_SITE_OTHER): Payer: Self-pay | Admitting: Internal Medicine

## 2018-04-29 ENCOUNTER — Telehealth: Payer: Self-pay | Admitting: *Deleted

## 2018-04-29 ENCOUNTER — Ambulatory Visit (INDEPENDENT_AMBULATORY_CARE_PROVIDER_SITE_OTHER): Payer: Medicare Other | Admitting: Internal Medicine

## 2018-04-29 ENCOUNTER — Other Ambulatory Visit: Payer: Self-pay | Admitting: Emergency Medicine

## 2018-04-29 ENCOUNTER — Telehealth: Payer: Self-pay | Admitting: Medical

## 2018-04-29 VITALS — BP 100/68 | HR 72 | Temp 98.0°F | Ht 63.0 in | Wt 115.9 lb

## 2018-04-29 DIAGNOSIS — C3490 Malignant neoplasm of unspecified part of unspecified bronchus or lung: Secondary | ICD-10-CM

## 2018-04-29 NOTE — Telephone Encounter (Signed)
Received call from Ander Purpura [facility] SN with [facility] [phone]~ telephone.   Reports that patient was to be discharged on 04/27/2018, but requested VO to extend [facility] SN services until 05/01/2018 in case any new orders are given from ID MD on 04/30/2018.  VO given.   MD and ID MD to be made aware.

## 2018-04-29 NOTE — Patient Instructions (Signed)
Am going to discus with Dr. Laural Golden.

## 2018-04-29 NOTE — Progress Notes (Addendum)
Subjective:    Patient ID: Haley [name], female    DOB: 10-12-1950, 67 y.o.   MRN: [id number]  HPI Referred by Dr. Buelah Manis for liver metastasis,lung metastasis, bone metastasis, brain metastasis.  She is being followed by [facility].  Early satiety. Possible EGD. Saw Dr. Buelah Manis 04/24/2018 given Rx for Carafate. She could not tolerate the Carafate. She tells me today she has been having problems with her stomach. She says she is 90% better since stent placement.  She says she did have terrible epigastric pain. She says the pain is better. She has nausea before she eats and after she eats. She says she is better since she had stent placed. BMs are okay as long as she takes her stool softener.  She has early satiety. She says she can only eat about 4 bites of her meal.  She says she is weak. She is lightheaded.  She says she is going to receive [facility] to hydrate her tomorrow.  Weight 04/06/2018 123. Today her weight is 115.9.   She finished radiation to her head in June (Metastatic cancer).  Port a cath removed per Infection control. She is trying to  drinking Ensure to supplement her diet.  No side effects from the Reglan.   04/14/2018 ERCP Dr. Therisa Doyne (ascending cholangitis, metastatic small cell cancer, previously placed uncovered metal biliary stent in 11/11/2017, suspect stent occulusion.  Impression:               - A single localized biliary stricture was found in                            the lower third of the main bile duct. The                            stricture was malignant appearing secondary to                            tumor ingrowth.                           - The lower third of the main bile duct was dilated.                           - One partially covered metal stent was placed into                            the common bile duct. Review of Systems Past Medical History:  Diagnosis Date  . Allergy   . Back pain   . Osteopenia   . Small cell lung cancer  ([place]) 10/17/2017   Extended stage with metastases to liver and bone  . Tubular adenoma of colon 10/26/2014    Past Surgical History:  Procedure Laterality Date  . BILIARY STENT PLACEMENT N/A 04/14/2018   Procedure: BILIARY STENT PLACEMENT;  Surgeon: [name], MD;  Location: [facility] ENDOSCOPY;  Service: Gastroenterology;  Laterality: N/A;  . COLONOSCOPY    . COLONOSCOPY WITH PROPOFOL N/A 10/25/2014   Procedure: COLONOSCOPY WITH PROPOFOL;  Surgeon: Garlan Fair, MD;  Location: [facility]OS[facility];  Service: Endoscopy;  Laterality: N/A;  . ERCP N/A 10/15/2017   Procedure: ENDOSCOPIC RETROGRADE CHOLANGIOPANCREATOGRAPHY (ERCP);  Surgeon: Clarene Essex, MD;  Location: [facility];  Service: Endoscopy;  Laterality: N/A;  . ERCP N/A 04/14/2018   Procedure: ENDOSCOPIC RETROGRADE CHOLANGIOPANCREATOGRAPHY (ERCP);  Surgeon: [name], MD;  Location: [facility];  Service: Gastroenterology;  Laterality: N/A;  with stent change and Balloon Sweep  . IR FLUORO GUIDE PORT INSERTION RIGHT  11/10/2017  . IR RADIOLOGIST EVAL & MGMT  10/02/2017  . IR US GUIDE VASC ACCESS RIGHT  11/10/2017  . PORT-A-CATH REMOVAL N/A 04/16/2018   Procedure: REMOVAL PORT-A-CATH;  Surgeon: Clovis Riley, MD;  Location: [facility] ORS;  Service: General;  Laterality: N/A;    No Known Allergies  Current Outpatient Medications on File Prior to Visit  Medication Sig Dispense Refill  . calcium carbonate (OS-CAL - DOSED IN MG OF ELEMENTAL CALCIUM) 1250 (500 Ca) MG tablet Take 1 tablet by mouth daily.     . hydrocortisone (CORTEF) 10 MG tablet Take 2 tablets (20 mg total) by mouth daily with breakfast. And 10mg  po daily with lunch. (Patient taking differently: Take 10-20 mg by mouth See admin instructions. 20 mg every morning And 10mg  with lunch.) 90 tablet 1  . lidocaine-prilocaine (EMLA) cream Apply to affected area once 30 g 3  . LORazepam (ATIVAN) 0.5 MG tablet Take 1 tablet (0.5 mg total) by mouth every 6 (six) hours as needed (Nausea or  vomiting). 30 tablet 0  . magic mouthwash w/lidocaine SOLN Take 10 mLs by mouth 4 (four) times daily as needed for mouth pain. Rinse and spit or swallow 240 mL 2  . metoCLOPramide (REGLAN) 10 MG tablet Take 1 tablet (10 mg total) by mouth every 8 (eight) hours as needed for nausea. 45 tablet 1  . Multiple Vitamins-Minerals (MULTIVITAMIN WITH MINERALS) tablet Take 1 tablet by mouth daily.    . ondansetron (ZOFRAN) 4 MG tablet Take 1 tablet (4 mg total) by mouth every 8 (eight) hours as needed for nausea or vomiting. 20 tablet 0  . oxyCODONE (OXY IR/ROXICODONE) 5 MG immediate release tablet Take 1-2 tablets (5-10 mg total) by mouth every 4 (four) hours as needed for moderate pain or severe pain. 60 tablet 0  . polyethylene glycol (MIRALAX) packet Take 17 g by mouth daily as needed for moderate constipation. 30 each 0  . potassium chloride SA (K-DUR,KLOR-CON) 20 MEQ tablet Take 1 tablet (20 mEq total) by mouth daily. Take for 7 days then repeat labs. 30 tablet 0  . prochlorperazine (COMPAZINE) 10 MG tablet Take 1 tablet (10 mg total) by mouth every 6 (six) hours as needed (Nausea or vomiting). 30 tablet 1  . senna-docusate (SENNA S) 8.6-50 MG tablet Take 2 tablets by mouth at bedtime. (Patient taking differently: Take 2 tablets by mouth at bedtime as needed for mild constipation or moderate constipation. ) 60 tablet 1  . sucralfate (CARAFATE) 1 GM/10ML suspension Take 10 mLs (1 g total) by mouth 3 (three) times daily. With meals 840 mL 2   Current Facility-Administered Medications on File Prior to Visit  Medication Dose Route Frequency Provider Last Rate Last Dose  . acetaminophen (TYLENOL) tablet 650 mg  650 mg Oral Once Curt Bears, MD      . diphenhydrAMINE (BENADRYL) capsule 25 mg  25 mg Oral Once Curt Bears, MD            Objective:   Physical Exam Blood pressure 100/68, pulse 72, temperature 98 F (36.7 C), height 5\' 3"  (1.6 m), weight 115 lb 14.4 oz (52.6 kg). Alert and  oriented.  Skin warm and dry. Oral mucosa is moist.   . Sclera anicteric, conjunctivae is pink. Thyroid not enlarged. No cervical lymphadenopathy. Lungs clear. Heart regular rate and rhythm.  Abdomen is soft. Bowel sounds are positive. No hepatomegaly. No abdominal masses felt. No tenderness.  No edema to lower extremities.             Assessment & Plan:  Early Satiety, weight loss. Am going to discuss with Dr. Laural Golden.  Metastatic cancer.   Recent ERCP 04/14/2018  Upper GI tract was grossly normal.

## 2018-04-29 NOTE — Telephone Encounter (Signed)
From now on her orders for [facility] need to come from oncology or ID I am not managing any of her care currently, so this is best from them, also on maternity leave

## 2018-04-29 NOTE — Telephone Encounter (Signed)
Called regarding 9/4 sch msg

## 2018-04-30 ENCOUNTER — Telehealth: Payer: Self-pay | Admitting: *Deleted

## 2018-04-30 ENCOUNTER — Inpatient Hospital Stay: Payer: Medicare Other | Attending: Hematology

## 2018-04-30 ENCOUNTER — Encounter: Payer: Self-pay | Admitting: Internal Medicine

## 2018-04-30 ENCOUNTER — Other Ambulatory Visit: Payer: Self-pay | Admitting: Emergency Medicine

## 2018-04-30 ENCOUNTER — Ambulatory Visit (INDEPENDENT_AMBULATORY_CARE_PROVIDER_SITE_OTHER): Payer: Medicare Other | Admitting: Internal Medicine

## 2018-04-30 ENCOUNTER — Inpatient Hospital Stay (HOSPITAL_BASED_OUTPATIENT_CLINIC_OR_DEPARTMENT_OTHER): Payer: Medicare Other | Admitting: Medical

## 2018-04-30 VITALS — BP 137/90 | HR 85 | Temp 97.7°F | Wt 113.8 lb

## 2018-04-30 VITALS — BP 104/67 | HR 74 | Temp 97.6°F | Resp 18 | Ht 63.0 in | Wt 118.1 lb

## 2018-04-30 DIAGNOSIS — C7972 Secondary malignant neoplasm of left adrenal gland: Secondary | ICD-10-CM | POA: Insufficient documentation

## 2018-04-30 DIAGNOSIS — C7931 Secondary malignant neoplasm of brain: Secondary | ICD-10-CM | POA: Insufficient documentation

## 2018-04-30 DIAGNOSIS — E871 Hypo-osmolality and hyponatremia: Secondary | ICD-10-CM | POA: Insufficient documentation

## 2018-04-30 DIAGNOSIS — R7881 Bacteremia: Secondary | ICD-10-CM | POA: Diagnosis present

## 2018-04-30 DIAGNOSIS — C3491 Malignant neoplasm of unspecified part of right bronchus or lung: Secondary | ICD-10-CM

## 2018-04-30 DIAGNOSIS — C3490 Malignant neoplasm of unspecified part of unspecified bronchus or lung: Secondary | ICD-10-CM

## 2018-04-30 DIAGNOSIS — E43 Unspecified severe protein-calorie malnutrition: Secondary | ICD-10-CM | POA: Insufficient documentation

## 2018-04-30 DIAGNOSIS — E860 Dehydration: Secondary | ICD-10-CM | POA: Diagnosis not present

## 2018-04-30 DIAGNOSIS — K5903 Drug induced constipation: Secondary | ICD-10-CM | POA: Insufficient documentation

## 2018-04-30 DIAGNOSIS — Z87891 Personal history of nicotine dependence: Secondary | ICD-10-CM | POA: Insufficient documentation

## 2018-04-30 DIAGNOSIS — C7889 Secondary malignant neoplasm of other digestive organs: Secondary | ICD-10-CM | POA: Insufficient documentation

## 2018-04-30 DIAGNOSIS — E2740 Unspecified adrenocortical insufficiency: Secondary | ICD-10-CM | POA: Insufficient documentation

## 2018-04-30 DIAGNOSIS — G893 Neoplasm related pain (acute) (chronic): Secondary | ICD-10-CM | POA: Insufficient documentation

## 2018-04-30 DIAGNOSIS — E876 Hypokalemia: Secondary | ICD-10-CM | POA: Insufficient documentation

## 2018-04-30 DIAGNOSIS — C787 Secondary malignant neoplasm of liver and intrahepatic bile duct: Secondary | ICD-10-CM | POA: Insufficient documentation

## 2018-04-30 DIAGNOSIS — C7951 Secondary malignant neoplasm of bone: Secondary | ICD-10-CM | POA: Insufficient documentation

## 2018-04-30 DIAGNOSIS — Z5112 Encounter for antineoplastic immunotherapy: Secondary | ICD-10-CM | POA: Insufficient documentation

## 2018-04-30 LAB — CBC WITH DIFFERENTIAL (CANCER CENTER ONLY)
Basophils Absolute: 0.1 10*3/uL (ref 0.0–0.1)
Basophils Relative: 1 %
Eosinophils Absolute: 0 10*3/uL (ref 0.0–0.5)
Eosinophils Relative: 0 %
HEMATOCRIT: 35.2 % (ref 34.8–46.6)
HEMOGLOBIN: 11.8 g/dL (ref 11.6–15.9)
LYMPHS ABS: 1.1 10*3/uL (ref 0.9–3.3)
LYMPHS PCT: 17 %
MCH: 28.4 pg (ref 25.1–34.0)
MCHC: 33.6 g/dL (ref 31.5–36.0)
MCV: 84.5 fL (ref 79.5–101.0)
Monocytes Absolute: 0.7 10*3/uL (ref 0.1–0.9)
Monocytes Relative: 11 %
NEUTROS ABS: 4.7 10*3/uL (ref 1.5–6.5)
NEUTROS PCT: 71 %
Platelet Count: 415 10*3/uL — ABNORMAL HIGH (ref 145–400)
RBC: 4.17 MIL/uL (ref 3.70–5.45)
RDW: 16.7 % — ABNORMAL HIGH (ref 11.2–14.5)
WBC Count: 6.6 10*3/uL (ref 3.9–10.3)

## 2018-04-30 LAB — CMP (CANCER CENTER ONLY)
ALBUMIN: 3.3 g/dL — AB (ref 3.5–5.0)
ALT: 24 U/L (ref 0–44)
ANION GAP: 10 (ref 5–15)
AST: 23 U/L (ref 15–41)
Alkaline Phosphatase: 187 U/L — ABNORMAL HIGH (ref 38–126)
BUN: 10 mg/dL (ref 8–23)
CHLORIDE: 84 mmol/L — AB (ref 98–111)
CO2: 23 mmol/L (ref 22–32)
Calcium: 9 mg/dL (ref 8.9–10.3)
Creatinine: 0.69 mg/dL (ref 0.44–1.00)
GFR, Est AFR Am: >60 mL/min (ref >60)
GFR, Estimated: >60 mL/min (ref >60)
Glucose, Bld: 88 mg/dL (ref 70–99)
Potassium: 4.7 mmol/L (ref 3.5–5.1)
Sodium: 117 mmol/L — ABNORMAL LOW (ref 135–145)
Total Bilirubin: 0.9 mg/dL (ref 0.3–1.2)
Total Protein: 6.9 g/dL (ref 6.5–8.1)

## 2018-04-30 MED ORDER — SODIUM CHLORIDE 0.9 % IV SOLN
Freq: Once | INTRAVENOUS | Status: DC
  Filled 2018-04-30: qty 250

## 2018-04-30 MED ORDER — SODIUM CHLORIDE 0.9 % IV SOLN
Freq: Once | INTRAVENOUS | Status: AC
  Administered 2018-04-30: 12:00:00 via INTRAVENOUS
  Filled 2018-04-30: qty 250

## 2018-04-30 MED ORDER — SODIUM CHLORIDE 0.9% FLUSH
10.0000 mL | Freq: Once | INTRAVENOUS | Status: AC
  Administered 2018-04-30: 10 mL
  Filled 2018-04-30: qty 10

## 2018-04-30 MED ORDER — HEPARIN SOD (PORK) LOCK FLUSH 100 UNIT/ML IV SOLN
250.0000 [IU] | Freq: Once | INTRAVENOUS | Status: AC
  Administered 2018-04-30: 250 [IU]
  Filled 2018-04-30: qty 5

## 2018-04-30 MED ORDER — SODIUM CHLORIDE 0.9 % IV SOLN
Freq: Once | INTRAVENOUS | Status: AC
  Administered 2018-04-30: 11:00:00 via INTRAVENOUS
  Filled 2018-04-30: qty 250

## 2018-04-30 NOTE — Patient Instructions (Signed)
Hyponatremia Hyponatremia is when the amount of salt (sodium) in your blood is too low. When salt levels are low, your cells absorb extra water and they swell. The swelling happens throughout the body, but it mostly affects the brain. Follow these instructions at home:  Take medicines only as told by your doctor. Many medicines can make this condition worse. Talk with your doctor about any medicines that you are currently taking.  Carefully follow a recommended diet as told by your doctor.  Carefully follow instructions from your doctor about fluid restrictions.  Keep all follow-up visits as told by your doctor. This is important.  Do not drink alcohol. Contact a doctor if:  You feel sicker to your stomach (nauseous).  You feel more confused.  You feel more tired (fatigued).  Your headache gets worse.  You feel weaker.  Your symptoms go away and then they come back.  You have trouble following the diet instructions. Get help right away if:  You start to twitch and shake (have a seizure).  You pass out (faint).  You keep having watery poop (diarrhea).  You keep throwing up (vomiting). This information is not intended to replace advice given to you by your health care provider. Make sure you discuss any questions you have with your health care provider. Document Released: 04/24/2011 Document Revised: 01/18/2016 Document Reviewed: 08/08/2014 Elsevier Interactive Patient Education  2018 [employer] Inc[employer]  

## 2018-04-30 NOTE — Progress Notes (Signed)
RFV: follow up on klebsiella bacteremia  Patient ID: Haley [name], female   DOB: August 19, 1951, 67 y.o.   MRN: [id number]  HPI Haley [name] is a 67yo F with hx of  metastatic extensive stage small call lung ca including bone, hepatic and pancreatic lesions in feb 2019 s/p patient has completed 4 cycles of carboplatin/etoposide/Atezolizumab and completed -completed WBRT 30 Gy in 10 fractions 01/15/2018 - 01/29/2018. Her course was complicated by pancreatic metastasis causing biliary obstruction s/p  ERCP, sphincterotomy and  stent placement uncovered stent placed size 808 to CBD  admitted on 8/19 with acute onset abdominal pain, altered mental status, fever and weakness- found to have AMS/unresponsive, with hypotension, fever of 103F, labs notable for leukocytosis of 16K-93%N,  Mildly elevated AST/ALT and elevated ALP. CT findings is concerning for stent occlusion and Increased size of hepatic metastatic lesions consistent with disease progression. She underwent ERCP who found stricture in distal to her current stent due to tumor ingrowth. Dr Therisa Doyne placed an additional stent to relieve obstruction. Her blood cx grew 4/4 bottles of pan sensitive kleb pneumoniae (amp R) on admit blood cx. Leukocytosis resolved and now afebrile since fluid resuscitation, abtx and less abd pain since ERCP. She had portacath removed. Just finished IV abtx. Still having difficulty with early satiety with solids and liquids.  She is upset that she has weighted so long during this appt. Overall states that she feels better. Denies fevers, no abdominal pain or jaundice or yeast infection.    Outpatient Encounter Medications as of 04/30/2018  Medication Sig  . calcium carbonate (OS-CAL - DOSED IN MG OF ELEMENTAL CALCIUM) 1250 (500 Ca) MG tablet Take 1 tablet by mouth daily.   . hydrocortisone (CORTEF) 10 MG tablet Take 2 tablets (20 mg total) by mouth daily with breakfast. And 10mg  po daily with lunch. (Patient taking  differently: Take 10-20 mg by mouth See admin instructions. 20 mg every morning And 10mg  with lunch.)  . lidocaine-prilocaine (EMLA) cream Apply to affected area once  . LORazepam (ATIVAN) 0.5 MG tablet Take 1 tablet (0.5 mg total) by mouth every 6 (six) hours as needed (Nausea or vomiting).  . magic mouthwash w/lidocaine SOLN Take 10 mLs by mouth 4 (four) times daily as needed for mouth pain. Rinse and spit or swallow  . metoCLOPramide (REGLAN) 10 MG tablet Take 1 tablet (10 mg total) by mouth every 8 (eight) hours as needed for nausea.  . Multiple Vitamins-Minerals (MULTIVITAMIN WITH MINERALS) tablet Take 1 tablet by mouth daily.  . ondansetron (ZOFRAN) 4 MG tablet Take 1 tablet (4 mg total) by mouth every 8 (eight) hours as needed for nausea or vomiting.  Marland Kitchen oxyCODONE (OXY IR/ROXICODONE) 5 MG immediate release tablet Take 1-2 tablets (5-10 mg total) by mouth every 4 (four) hours as needed for moderate pain or severe pain.  . polyethylene glycol (MIRALAX) packet Take 17 g by mouth daily as needed for moderate constipation.  . potassium chloride SA (K-DUR,KLOR-CON) 20 MEQ tablet Take 1 tablet (20 mEq total) by mouth daily. Take for 7 days then repeat labs.  . prochlorperazine (COMPAZINE) 10 MG tablet Take 1 tablet (10 mg total) by mouth every 6 (six) hours as needed (Nausea or vomiting).  Marland Kitchen senna-docusate (SENNA S) 8.6-50 MG tablet Take 2 tablets by mouth at bedtime. (Patient taking differently: Take 2 tablets by mouth at bedtime as needed for mild constipation or moderate constipation. )  . sucralfate (CARAFATE) 1 GM/10ML suspension Take 10 mLs (1 g total) by mouth  3 (three) times daily. With meals   Facility-Administered Encounter Medications as of 04/30/2018  Medication  . [COMPLETED] 0.9 %  sodium chloride infusion  . acetaminophen (TYLENOL) tablet 650 mg  . diphenhydrAMINE (BENADRYL) capsule 25 mg     Patient Active Problem List   Diagnosis Date Noted  . Bacteremia due to Pseudomonas  04/16/2018  . Abnormal head CT 04/13/2018  . Adrenal nodule ([place]) 04/13/2018  . Hyponatremia 04/13/2018  . Hyperammonemia ([place]) 04/13/2018  . Lactic acidosis 04/13/2018  . Thrombocytosis ([place]) 04/13/2018  . Sepsis ([place]) 04/13/2018  . Hypotension 04/13/2018  . Protein-calorie malnutrition, severe 04/13/2018  . Abnormal liver function   . Generalized abdominal pain   . Malnutrition of moderate degree 03/02/2018  . Dehydration 03/01/2018  . Nausea & vomiting 03/01/2018  . Brain metastasis ([place]) 01/08/2018  . HCAP (healthcare-associated pneumonia) 12/19/2017  . Extensive stage primary small cell carcinoma of lung ([place]) 10/22/2017  . Bone metastasis ([place]) 10/22/2017  . Counseling regarding advanced care planning and goals of care 10/22/2017  . Liver metastases ([place])   . 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 Metastatic disease ([place])   . Jaundice 10/13/2017  . Mood disorder ([place]) 05/12/2017  . Osteoarthritis 05/12/2017  . Osteopenia 05/06/2016  . Eustachian tube dysfunction 07/03/2015  . Allergic rhinitis 07/03/2015  . Tobacco use disorder 07/03/2015  . Hyperlipidemia 07/03/2015     Health Maintenance Due  Topic Date Due  . PNA vac Low Risk Adult (1 of 2 - PCV13) 08/09/2017  . INFLUENZA VACCINE  03/26/2018     Review of Systems Per hpi otherwise 12 point ros is negative Physical Exam   BP 137/90   Pulse 85   Temp 97.7 F (36.5 C) (Oral)   Wt 113 lb 12.8 oz (51.6 kg)   BMI 20.16 kg/m   Physical Exam  Constitutional:  oriented to person, place, and time. appears well-developed and well-nourished. Appears fatigued HENT: [place]/AT, PERRLA, no scleral icterus Mouth/Throat: Oropharynx is clear and moist. No oropharyngeal exudate.  Ext: picc line is c/d./i Neurological: alert and oriented to person, place, and time.  Skin: Skin is warm and dry. No rash noted. No erythema.  Psychiatric: a normal mood and affect.  behavior is normal.   CBC Lab Results  Component Value  Date   WBC 6.6 04/30/2018   RBC 4.17 04/30/2018   HGB 11.8 04/30/2018   HCT 35.2 04/30/2018   PLT 415 (H) 04/30/2018   MCV 84.5 04/30/2018   MCH 28.4 04/30/2018   MCHC 33.6 04/30/2018   RDW 16.7 (H) 04/30/2018   LYMPHSABS 1.1 04/30/2018   MONOABS 0.7 04/30/2018   EOSABS 0.0 04/30/2018    BMET Lab Results  Component Value Date   NA 117 (L) 04/30/2018   K 4.7 04/30/2018   CL 84 (L) 04/30/2018   CO2 23 04/30/2018   GLUCOSE 88 04/30/2018   BUN 10 04/30/2018   CREATININE 0.69 04/30/2018   CALCIUM 9.0 04/30/2018   GFRNONAA >60 04/30/2018   GFRAA >60 04/30/2018      Assessment and Plan  Gram negative bacteremia thought to be due to biliary blockage = now has finished iv abtx.  Need to check with dr kale to see if needs picc otherwise will pull picc line

## 2018-04-30 NOTE — Telephone Encounter (Signed)
Call placed to Lauren, [place] aware of MD recommendations.   States that she will defer to Dr. Baxter Flattery for orders.

## 2018-04-30 NOTE — Progress Notes (Signed)
Pt filled out walk in form on 9/4 asking for IVF.  Since clinic was full and pt felt stable enough to wait until the next day pt scheduled to arrive for labs and [facility] visit to assess for fluid needs on 9/5.  Labs drawn, pt receiving 2 L NS on 9/5 d/t fatigue and low sodium.  Pt to return tomorrow 9/6 for repeat labs and more IVF.  Per PA Lucianne Lei pt also to continue taking PO potassium at home until her eating becomes more consistent.  Pt verbalized understanding of this and appts.

## 2018-04-30 NOTE — Telephone Encounter (Signed)
Patient nurse Lauren called to advise she needs to know if she can pull the PICC or extend the antibiotic. Advised her patient is to be seen today 04/30/18 at 4 pm and once she does we will give her a call either way.   Contact info Lauren [phone]

## 2018-05-01 ENCOUNTER — Inpatient Hospital Stay (HOSPITAL_COMMUNITY)
Admission: AD | Admit: 2018-05-01 | Discharge: 2018-05-05 | DRG: 643 | Disposition: A | Discharge status: Home / Self Care | Payer: Medicare Other | Source: Ambulatory Visit | Attending: Family Medicine | Admitting: Family Medicine

## 2018-05-01 ENCOUNTER — Inpatient Hospital Stay: Payer: Medicare Other | Admitting: Medical

## 2018-05-01 ENCOUNTER — Other Ambulatory Visit: Payer: Self-pay

## 2018-05-01 ENCOUNTER — Inpatient Hospital Stay: Payer: Medicare Other

## 2018-05-01 ENCOUNTER — Inpatient Hospital Stay (HOSPITAL_BASED_OUTPATIENT_CLINIC_OR_DEPARTMENT_OTHER): Payer: Medicare Other | Admitting: Medical

## 2018-05-01 ENCOUNTER — Encounter (HOSPITAL_COMMUNITY): Payer: Self-pay | Admitting: *Deleted

## 2018-05-01 VITALS — BP 126/77 | HR 71 | Temp 98.1°F | Resp 18 | Ht 63.0 in | Wt 118.5 lb

## 2018-05-01 DIAGNOSIS — E860 Dehydration: Secondary | ICD-10-CM

## 2018-05-01 DIAGNOSIS — Z87891 Personal history of nicotine dependence: Secondary | ICD-10-CM

## 2018-05-01 DIAGNOSIS — C801 Malignant (primary) neoplasm, unspecified: Secondary | ICD-10-CM | POA: Diagnosis present

## 2018-05-01 DIAGNOSIS — E222 Syndrome of inappropriate secretion of antidiuretic hormone: Principal | ICD-10-CM | POA: Diagnosis present

## 2018-05-01 DIAGNOSIS — C3490 Malignant neoplasm of unspecified part of unspecified bronchus or lung: Secondary | ICD-10-CM

## 2018-05-01 DIAGNOSIS — E871 Hypo-osmolality and hyponatremia: Secondary | ICD-10-CM | POA: Diagnosis present

## 2018-05-01 DIAGNOSIS — C787 Secondary malignant neoplasm of liver and intrahepatic bile duct: Secondary | ICD-10-CM | POA: Diagnosis present

## 2018-05-01 DIAGNOSIS — E2740 Unspecified adrenocortical insufficiency: Secondary | ICD-10-CM | POA: Diagnosis present

## 2018-05-01 DIAGNOSIS — Z8249 Family history of ischemic heart disease and other diseases of the circulatory system: Secondary | ICD-10-CM

## 2018-05-01 DIAGNOSIS — Z7189 Other specified counseling: Secondary | ICD-10-CM

## 2018-05-01 DIAGNOSIS — C7972 Secondary malignant neoplasm of left adrenal gland: Secondary | ICD-10-CM | POA: Diagnosis present

## 2018-05-01 DIAGNOSIS — Z833 Family history of diabetes mellitus: Secondary | ICD-10-CM

## 2018-05-01 DIAGNOSIS — C7931 Secondary malignant neoplasm of brain: Secondary | ICD-10-CM | POA: Diagnosis present

## 2018-05-01 DIAGNOSIS — C799 Secondary malignant neoplasm of unspecified site: Secondary | ICD-10-CM | POA: Diagnosis present

## 2018-05-01 DIAGNOSIS — K831 Obstruction of bile duct: Secondary | ICD-10-CM | POA: Diagnosis present

## 2018-05-01 DIAGNOSIS — Z9221 Personal history of antineoplastic chemotherapy: Secondary | ICD-10-CM

## 2018-05-01 DIAGNOSIS — Z923 Personal history of irradiation: Secondary | ICD-10-CM

## 2018-05-01 DIAGNOSIS — M8580 Other specified disorders of bone density and structure, unspecified site: Secondary | ICD-10-CM | POA: Diagnosis present

## 2018-05-01 DIAGNOSIS — R627 Adult failure to thrive: Secondary | ICD-10-CM

## 2018-05-01 DIAGNOSIS — M8448XA Pathological fracture, other site, initial encounter for fracture: Secondary | ICD-10-CM | POA: Diagnosis present

## 2018-05-01 DIAGNOSIS — C3491 Malignant neoplasm of unspecified part of right bronchus or lung: Secondary | ICD-10-CM | POA: Diagnosis present

## 2018-05-01 DIAGNOSIS — C7951 Secondary malignant neoplasm of bone: Secondary | ICD-10-CM | POA: Diagnosis present

## 2018-05-01 DIAGNOSIS — Z8349 Family history of other endocrine, nutritional and metabolic diseases: Secondary | ICD-10-CM

## 2018-05-01 DIAGNOSIS — E876 Hypokalemia: Secondary | ICD-10-CM | POA: Diagnosis present

## 2018-05-01 DIAGNOSIS — W19XXXA Unspecified fall, initial encounter: Secondary | ICD-10-CM | POA: Diagnosis present

## 2018-05-01 DIAGNOSIS — E43 Unspecified severe protein-calorie malnutrition: Secondary | ICD-10-CM | POA: Diagnosis present

## 2018-05-01 LAB — CBC WITH DIFFERENTIAL/PLATELET
Basophils Absolute: 0 10*3/uL (ref 0.0–0.1)
Basophils Relative: 0 %
EOS ABS: 0 10*3/uL (ref 0.0–0.5)
Eosinophils Relative: 0 %
HCT: 32.8 % — ABNORMAL LOW (ref 34.8–46.6)
HEMOGLOBIN: 11.1 g/dL — AB (ref 11.6–15.9)
LYMPHS ABS: 0.6 10*3/uL — AB (ref 0.9–3.3)
LYMPHS PCT: 8 %
MCH: 28.4 pg (ref 25.1–34.0)
MCHC: 33.9 g/dL (ref 31.5–36.0)
MCV: 83.9 fL (ref 79.5–101.0)
Monocytes Absolute: 0.6 10*3/uL (ref 0.1–0.9)
Monocytes Relative: 8 %
NEUTROS ABS: 6.2 10*3/uL (ref 1.5–6.5)
NEUTROS PCT: 84 %
Platelets: 374 10*3/uL (ref 145–400)
RBC: 3.91 MIL/uL (ref 3.70–5.45)
RDW: 16.5 % — ABNORMAL HIGH (ref 11.2–14.5)
WBC: 7.4 10*3/uL (ref 3.9–10.3)

## 2018-05-01 LAB — BASIC METABOLIC PANEL
ANION GAP: 8 (ref 5–15)
BUN: 6 mg/dL — ABNORMAL LOW (ref 8–23)
CALCIUM: 8.2 mg/dL — AB (ref 8.9–10.3)
CO2: 25 mmol/L (ref 22–32)
Chloride: 86 mmol/L — ABNORMAL LOW (ref 98–111)
Creatinine, Ser: <0.3 mg/dL — ABNORMAL LOW (ref 0.44–1.00)
GLUCOSE: 118 mg/dL — AB (ref 70–99)
Potassium: 3.8 mmol/L (ref 3.5–5.1)
Sodium: 119 mmol/L — CL (ref 135–145)

## 2018-05-01 LAB — BASIC METABOLIC PANEL - CANCER CENTER ONLY
ANION GAP: 9 (ref 5–15)
BUN: 6 mg/dL — ABNORMAL LOW (ref 8–23)
CALCIUM: 8.8 mg/dL — AB (ref 8.9–10.3)
CHLORIDE: 84 mmol/L — AB (ref 98–111)
CO2: 23 mmol/L (ref 22–32)
Creatinine: 0.55 mg/dL (ref 0.44–1.00)
GFR, Est AFR Am: >60 mL/min (ref >60)
GFR, Estimated: >60 mL/min (ref >60)
Glucose, Bld: 90 mg/dL (ref 70–99)
POTASSIUM: 4.1 mmol/L (ref 3.5–5.1)
Sodium: 116 mmol/L — ABNORMAL LOW (ref 135–145)

## 2018-05-01 LAB — PHOSPHORUS: Phosphorus: 3.4 mg/dL (ref 2.5–4.6)

## 2018-05-01 LAB — TSH: TSH: 1.115 u[IU]/mL (ref 0.308–3.960)

## 2018-05-01 LAB — MAGNESIUM: Magnesium: 1.6 mg/dL — ABNORMAL LOW (ref 1.7–2.4)

## 2018-05-01 MED ORDER — MAGNESIUM SULFATE 2 GM/50ML IV SOLN
2.0000 g | Freq: Once | INTRAVENOUS | Status: AC
  Administered 2018-05-01: 2 g via INTRAVENOUS
  Filled 2018-05-01: qty 50

## 2018-05-01 MED ORDER — PROCHLORPERAZINE MALEATE 10 MG PO TABS
10.0000 mg | ORAL_TABLET | Freq: Four times a day (QID) | ORAL | Status: DC | PRN
  Administered 2018-05-04 – 2018-05-05 (×2): 10 mg via ORAL
  Filled 2018-05-01 (×2): qty 1

## 2018-05-01 MED ORDER — OXYCODONE HCL 5 MG PO TABS
5.0000 mg | ORAL_TABLET | ORAL | Status: DC | PRN
  Administered 2018-05-01 – 2018-05-04 (×6): 5 mg via ORAL
  Administered 2018-05-05: 10 mg via ORAL
  Administered 2018-05-05: 5 mg via ORAL
  Filled 2018-05-01: qty 2
  Filled 2018-05-01: qty 1
  Filled 2018-05-01: qty 2
  Filled 2018-05-01: qty 1
  Filled 2018-05-01: qty 2
  Filled 2018-05-01 (×3): qty 1

## 2018-05-01 MED ORDER — MAGIC MOUTHWASH W/LIDOCAINE
10.0000 mL | Freq: Four times a day (QID) | ORAL | Status: DC | PRN
  Filled 2018-05-01: qty 10

## 2018-05-01 MED ORDER — HYDROCORTISONE 20 MG PO TABS
20.0000 mg | ORAL_TABLET | Freq: Every day | ORAL | Status: DC
  Administered 2018-05-02 – 2018-05-05 (×4): 20 mg via ORAL
  Filled 2018-05-01 (×4): qty 1

## 2018-05-01 MED ORDER — HYDROCORTISONE 10 MG PO TABS
10.0000 mg | ORAL_TABLET | Freq: Every day | ORAL | Status: DC
  Administered 2018-05-02 – 2018-05-05 (×4): 10 mg via ORAL
  Filled 2018-05-01 (×4): qty 1

## 2018-05-01 MED ORDER — SODIUM CHLORIDE 0.9% FLUSH
3.0000 mL | Freq: Two times a day (BID) | INTRAVENOUS | Status: DC
  Administered 2018-05-02 – 2018-05-03 (×2): 3 mL via INTRAVENOUS

## 2018-05-01 MED ORDER — LORAZEPAM 0.5 MG PO TABS: 0.5000 mg | ORAL_TABLET | Freq: Four times a day (QID) | ORAL | Status: DC | PRN

## 2018-05-01 MED ORDER — ACETAMINOPHEN 325 MG PO TABS
650.0000 mg | ORAL_TABLET | Freq: Four times a day (QID) | ORAL | Status: DC | PRN
  Administered 2018-05-02: 650 mg via ORAL
  Filled 2018-05-01: qty 2

## 2018-05-01 MED ORDER — SODIUM CHLORIDE 0.9% FLUSH: 10.0000 mL | INTRAVENOUS | Status: DC | PRN

## 2018-05-01 MED ORDER — ALBUTEROL SULFATE (2.5 MG/3ML) 0.083% IN NEBU: 2.5000 mg | INHALATION_SOLUTION | RESPIRATORY_TRACT | Status: DC | PRN

## 2018-05-01 MED ORDER — ONDANSETRON HCL 4 MG/2ML IJ SOLN
4.0000 mg | Freq: Four times a day (QID) | INTRAMUSCULAR | Status: DC | PRN
  Administered 2018-05-03 – 2018-05-04 (×2): 4 mg via INTRAVENOUS
  Filled 2018-05-01 (×2): qty 2

## 2018-05-01 MED ORDER — ONDANSETRON HCL 4 MG PO TABS: 4.0000 mg | ORAL_TABLET | Freq: Three times a day (TID) | ORAL | Status: DC | PRN

## 2018-05-01 MED ORDER — ADULT MULTIVITAMIN W/MINERALS CH
1.0000 | ORAL_TABLET | Freq: Every day | ORAL | Status: DC
  Administered 2018-05-01 – 2018-05-05 (×5): 1 via ORAL
  Filled 2018-05-01 (×5): qty 1

## 2018-05-01 MED ORDER — HEPARIN SODIUM (PORCINE) 5000 UNIT/ML IJ SOLN
5000.0000 [IU] | Freq: Three times a day (TID) | INTRAMUSCULAR | Status: DC
  Administered 2018-05-01 – 2018-05-05 (×10): 5000 [IU] via SUBCUTANEOUS
  Filled 2018-05-01 (×11): qty 1

## 2018-05-01 MED ORDER — POTASSIUM CHLORIDE CRYS ER 20 MEQ PO TBCR
20.0000 meq | EXTENDED_RELEASE_TABLET | Freq: Every day | ORAL | Status: DC
  Administered 2018-05-02 – 2018-05-05 (×4): 20 meq via ORAL
  Filled 2018-05-01 (×4): qty 1

## 2018-05-01 MED ORDER — ADULT MULTIVITAMIN W/MINERALS CH
1.0000 | ORAL_TABLET | Freq: Every day | ORAL | Status: DC
  Filled 2018-05-01: qty 1

## 2018-05-01 MED ORDER — METOCLOPRAMIDE HCL 10 MG PO TABS: 10.0000 mg | ORAL_TABLET | Freq: Three times a day (TID) | ORAL | Status: DC | PRN

## 2018-05-01 MED ORDER — TRAZODONE HCL 50 MG PO TABS: 50.0000 mg | ORAL_TABLET | Freq: Every evening | ORAL | Status: DC | PRN

## 2018-05-01 MED ORDER — ONDANSETRON HCL 4 MG PO TABS: 4.0000 mg | ORAL_TABLET | Freq: Four times a day (QID) | ORAL | Status: DC | PRN

## 2018-05-01 MED ORDER — SODIUM CHLORIDE 0.9 % IV SOLN
Freq: Once | INTRAVENOUS | Status: AC
  Administered 2018-05-01: 11:00:00 via INTRAVENOUS
  Filled 2018-05-01: qty 250

## 2018-05-01 MED ORDER — SODIUM CHLORIDE 0.9 % IV SOLN: 250.0000 mL | INTRAVENOUS | Status: DC | PRN

## 2018-05-01 MED ORDER — SODIUM CHLORIDE 0.9% FLUSH: 3.0000 mL | INTRAVENOUS | Status: DC | PRN

## 2018-05-01 MED ORDER — SODIUM CHLORIDE 0.9 % IV SOLN
INTRAVENOUS | Status: DC
  Administered 2018-05-01 – 2018-05-05 (×12): via INTRAVENOUS

## 2018-05-01 MED ORDER — CALCIUM CARBONATE 1250 (500 CA) MG PO TABS
1.0000 | ORAL_TABLET | Freq: Every day | ORAL | Status: DC
  Administered 2018-05-01 – 2018-05-05 (×5): 500 mg via ORAL
  Filled 2018-05-01 (×5): qty 1

## 2018-05-01 MED ORDER — POLYETHYLENE GLYCOL 3350 17 G PO PACK
17.0000 g | PACK | Freq: Every day | ORAL | Status: DC | PRN
  Administered 2018-05-02 – 2018-05-03 (×2): 17 g via ORAL
  Filled 2018-05-01 (×3): qty 1

## 2018-05-01 MED ORDER — TRAMADOL HCL 50 MG PO TABS
50.0000 mg | ORAL_TABLET | Freq: Four times a day (QID) | ORAL | Status: DC | PRN
  Administered 2018-05-01 – 2018-05-02 (×3): 50 mg via ORAL
  Filled 2018-05-01 (×3): qty 1

## 2018-05-01 MED ORDER — ACETAMINOPHEN 650 MG RE SUPP: 650.0000 mg | Freq: Four times a day (QID) | RECTAL | Status: DC | PRN

## 2018-05-01 MED ORDER — SENNOSIDES-DOCUSATE SODIUM 8.6-50 MG PO TABS
2.0000 | ORAL_TABLET | Freq: Two times a day (BID) | ORAL | Status: DC
  Administered 2018-05-01 – 2018-05-05 (×4): 2 via ORAL
  Filled 2018-05-01 (×5): qty 2

## 2018-05-01 NOTE — Progress Notes (Signed)
[facility] Progress Note   Haley [name] [id number] 1951/02/23 67 y.o.  Haley [name] is managed by Dr. Sullivan Lone  Actively treated with chemotherapy/immunotherapy: no   Assessment: Plan:    No diagnosis found.   Dehydration and hyponatremia: A chemistry panel returned showing a sodium level of 117.  The patient was given 2 L of normal saline IV yesterday.  She returned today with a repeat chemistry panel showing a sodium level of 116 despite her continuing on hydrocortisone 10 mg p.o. twice daily.  She was started on IV hydration with 1 L of normal saline.  The case was discussed with Dr. Irene Limbo with the patient direct admitted to [facility] for management.  W  Extensive stage small cell lung cancer: Haley [name] continues to be followed by Dr. Sullivan Lone and is currently under no active treatment.    Hypomagnesemia: A magnesium level returned today at 1.6.    History of hypophosphatemia: A phosphorus level returned at 3.4 today.  Please see After Visit Summary for patient specific instructions.  Future Appointments  Date Time Provider [place]  05/05/2018  1:15 PM Renato Shin, MD [facility] None  06/02/2018 11:00 AM Arfeen, Arlyce Harman, MD [facility] None  08/04/2018 10:30 AM Alycia Rossetti, MD [facility] None    No orders of the defined types were placed in this encounter.      Subjective:   Patient ID:  Haley [name] is a 67 y.o. (DOB 11-17-1950) female.  Chief Complaint:  No chief complaint on file.   HPI Haley [name] is a 67 year old female with a history of an extensive stage small cell lung cancer who is followed by Dr. Sullivan Lone and is currently managed with watchful waiting.  She was most recently admitted from 04/12/2018 through 04/18/2018 for sepsis secondary to cholangitis and Pseudomonas bacteremia.  She also was noted to have hyponatremia, hypokalemia, hypomagnesemia, and hypophosphatemia.  She presents to the  office yesterday with dehydration and anorexia.  A chemistry panel returned yesterday with a sodium level of 117.  The patient was given 2 L of normal saline and was asked to return today.  Despite her infusion of saline yesterday and continuation of hydrocortisone at 10 mg p.o. twice daily, her sodium level returned today at 116.  She continues to be weak and anorexic.  She has nausea but no vomiting, constipation, or diarrhea.  She has had no seizures or syncopal episodes.  Medications: I have reviewed the patient's current medications.  Allergies: No Known Allergies  Past Medical History:  Diagnosis Date  . Allergy   . Back pain   . Osteopenia   . Small cell lung cancer ([place]) 10/17/2017   Extended stage with metastases to liver and bone  . Tubular adenoma of colon 10/26/2014    Past Surgical History:  Procedure Laterality Date  . BILIARY STENT PLACEMENT N/A 04/14/2018   Procedure: BILIARY STENT PLACEMENT;  Surgeon: [name], MD;  Location: [facility];  Service: Gastroenterology;  Laterality: N/A;  . COLONOSCOPY    . COLONOSCOPY WITH PROPOFOL N/A 10/25/2014   Procedure: COLONOSCOPY WITH PROPOFOL;  Surgeon: Garlan Fair, MD;  Location: [facility];  Service: Endoscopy;  Laterality: N/A;  . ERCP N/A 10/15/2017   Procedure: ENDOSCOPIC RETROGRADE CHOLANGIOPANCREATOGRAPHY (ERCP);  Surgeon: Clarene Essex, MD;  Location: [facility];  Service: Endoscopy;  Laterality: N/A;  . ERCP N/A 04/14/2018   Procedure: ENDOSCOPIC RETROGRADE CHOLANGIOPANCREATOGRAPHY (ERCP);  Surgeon: [name], MD;  Location: [facility];  Service: Gastroenterology;  Laterality: N/A;  with stent change and Balloon Sweep  . IR FLUORO GUIDE PORT INSERTION RIGHT  11/10/2017  . IR RADIOLOGIST EVAL & MGMT  10/02/2017  . IR US GUIDE VASC ACCESS RIGHT  11/10/2017  . PORT-A-CATH REMOVAL N/A 04/16/2018   Procedure: REMOVAL PORT-A-CATH;  Surgeon: Clovis Riley, MD;  Location: [facility];  Service: General;  Laterality: N/A;     Family History  Problem Relation Age of Onset  . Diabetes Mother   . Hypertension Mother   . Diabetes Sister   . Hypertension Sister   . Hyperlipidemia Sister     Social History   Socioeconomic History  . Marital status: Single    Spouse name: Not on file  . Number of children: Not on file  . Years of education: Not on file  . Highest education level: Not on file  Occupational History  . Not on file  Social Needs  . Financial resource strain: Not on file  . Food insecurity:    Worry: Not on file    Inability: Not on file  . Transportation needs:    Medical: Not on file    Non-medical: Not on file  Tobacco Use  . Smoking status: Former Smoker    Packs/day: 1.00    Years: 40.00    Pack years: 40.00    Types: Cigarettes    Last attempt to quit: 09/24/2017    Years since quitting: 0.6  . Smokeless tobacco: Never Used  Substance and Sexual Activity  . Alcohol use: Yes    Comment: rare social  . Drug use: No  . Sexual activity: Not Currently  Lifestyle  . Physical activity:    Days per week: Not on file    Minutes per session: Not on file  . Stress: Not on file  Relationships  . Social connections:    Talks on phone: Not on file    Gets together: Not on file    Attends religious service: Not on file    Active member of club or organization: Not on file    Attends meetings of clubs or organizations: Not on file    Relationship status: Not on file  . Intimate partner violence:    Fear of current or ex partner: Not on file    Emotionally abused: Not on file    Physically abused: Not on file    Forced sexual activity: Not on file  Other Topics Concern  . Not on file  Social History Narrative  . Not on file    Past Medical History, Surgical history, Social history, and Family history were reviewed and updated as appropriate.   Please see review of systems for further details on the patient's review from today.   Review of Systems:  Review of Systems   Constitutional: Positive for appetite change. Negative for chills, diaphoresis, fever and unexpected weight change.  HENT: Negative for trouble swallowing.   Respiratory: Negative for cough, chest tightness and shortness of breath.   Cardiovascular: Negative for chest pain and palpitations.  Gastrointestinal: Positive for nausea. Negative for abdominal pain, constipation, diarrhea and vomiting.  Neurological: Negative for dizziness and headaches.    Objective:   Physical Exam:  There were no vitals taken for this visit. ECOG: 1  Physical Exam  Constitutional: No distress.  HENT:  Head: Normocephalic and atraumatic.  Mucous membranes are dry.  Cardiovascular: Normal rate, regular rhythm and normal heart sounds. Exam reveals no  gallop and no friction rub.  No murmur heard. Pulmonary/Chest: Effort normal and breath sounds normal. No respiratory distress. She has no wheezes. She has no rales.  Neurological: She is alert. Coordination normal.  Skin: Skin is warm and dry. No rash noted. She is not diaphoretic. No erythema.  Psychiatric: She has a normal mood and affect. Her behavior is normal. Judgment and thought content normal.    Lab Review:     Component Value Date/Time   NA 119 (LL) 05/01/2018 1736   K 3.8 05/01/2018 1736   CL 86 (L) 05/01/2018 1736   CO2 25 05/01/2018 1736   GLUCOSE 118 (H) 05/01/2018 1736   BUN 6 (L) 05/01/2018 1736   CREATININE <0.30 (L) 05/01/2018 1736   CREATININE 0.55 05/01/2018 0949   CREATININE 0.80 05/12/2017 1110   CALCIUM 8.2 (L) 05/01/2018 1736   CALCIUM 6.7 03/02/2018 1125   PROT 6.9 04/30/2018 0936   ALBUMIN 3.3 (L) 04/30/2018 0936   AST 23 04/30/2018 0936   ALT 24 04/30/2018 0936   ALKPHOS 187 (H) 04/30/2018 0936   BILITOT 0.9 04/30/2018 0936   GFRNONAA NOT CALCULATED 05/01/2018 1736   GFRNONAA >60 05/01/2018 0949   GFRNONAA 81 05/03/2016 0827   GFRAA NOT CALCULATED 05/01/2018 1736   GFRAA >60 05/01/2018 0949   GFRAA >89 05/03/2016  0827       Component Value Date/Time   WBC 7.4 05/01/2018 0949   RBC 3.91 05/01/2018 0949   HGB 11.1 (L) 05/01/2018 0949   HGB 11.8 04/30/2018 0936   HCT 32.8 (L) 05/01/2018 0949   PLT 374 05/01/2018 0949   PLT 415 (H) 04/30/2018 0936   MCV 83.9 05/01/2018 0949   MCH 28.4 05/01/2018 0949   MCHC 33.9 05/01/2018 0949   RDW 16.5 (H) 05/01/2018 0949   LYMPHSABS 0.6 (L) 05/01/2018 0949   MONOABS 0.6 05/01/2018 0949   EOSABS 0.0 05/01/2018 0949   BASOSABS 0.0 05/01/2018 0949   -------------------------------  Imaging from last 24 hours (if applicable):  Radiology interpretation: Ct Head Wo Contrast  Result Date: 04/12/2018 CLINICAL DATA:  History of liver cancer and small cell lung cancer. Last chemotherapy 2 weeks ago. Abdominal pain and altered mental status. EXAM: CT HEAD WITHOUT CONTRAST TECHNIQUE: Contiguous axial images were obtained from the base of the skull through the vertex without intravenous contrast. COMPARISON:  02/24/2018 FINDINGS: Brain: No mass effect or midline shift. No abnormal extra-axial fluid collections. No ventricular dilatation. There is vague low-attenuation change in the left anterior frontal white matter. This is similar to previous study and likely represents an area of small vessel ischemic change. If there is high clinical suspicion for intracranial metastasis, then contrast-enhanced MR would be the more sensitive examination. Gray-white matter junctions are distinct. Basal cisterns are not effaced. No acute intracranial hemorrhage. Vascular: Mild intracranial arterial calcifications are present. Skull: Calvarium appears intact. No acute depressed skull fractures or destructive bone lesions identified. Sinuses/Orbits: There is an air-fluid level in the left sphenoid sinus, similar previous study. This is likely inflammatory. Paranasal sinuses and mastoid air cells are otherwise clear. Other: None. IMPRESSION: No acute intracranial abnormalities. Focal  low-attenuation change in the left anterior frontal white matter is similar to previous study and likely represents an area of small vessel ischemic change. If there is high clinical suspicion for intracranial metastasis, then contrast-enhanced MR would be the more sensitive examination. Electronically Signed   By: Lucienne Capers M.D.   On: 04/12/2018 23:24   Ct Abdomen Pelvis W Contrast  Result Date: 04/12/2018 CLINICAL DATA:  Liver cancer abdominal pain EXAM: CT ABDOMEN AND PELVIS WITH CONTRAST TECHNIQUE: Multidetector CT imaging of the abdomen and pelvis was performed using the standard protocol following bolus administration of intravenous contrast. CONTRAST:  25mL ISOVUE-300 IOPAMIDOL (ISOVUE-300) INJECTION 61% COMPARISON:  MRI 02/25/2018, PET-CT 12/26/2017, CT 05/23/2017 FINDINGS: Lower chest: Lung bases demonstrate tiny pleural effusions. Passive atelectasis in the posterior left lung base. Persistent collapse in the right lower lobe with occlusion of the bronchus as before. Heart size within normal limits. Coronary vascular calcification. Hepatobiliary: Increased intra hepatic biliary dilatation since prior study. Increased size of hypodense metastatic liver lesions. Index posterior right hepatic lobe lesion now measures 23 mm compared with 10 mm previously. A left lobe lesion measures 16 mm, compared with 9 mm previously. Decreased pneumobilia with only minimal foci of pneumobilia evident. No air visible within the biliary stent except at its duodenal termination. Minimal wall thickening of the gallbladder. No calcified stones. Pancreas: No inflammatory changes.  No measurable mass. Spleen: Normal in size without focal abnormality. Adrenals/Urinary Tract: Right adrenal gland is within normal limits. Subtle increased nodularity of the left adrenal gland no hydronephrosis. The bladder is distended Stomach/Bowel: The stomach is nonenlarged. No dilated small bowel. No colon wall thickening.  Vascular/Lymphatic: Moderate to marked aortic atherosclerosis. No aneurysmal dilatation. No definitive adenopathy. Reproductive: Uterus and bilateral adnexa are unremarkable. Other: Negative for free air. Tiny amount of free fluid in the pelvis. Musculoskeletal: Extensive skeletal metastatic disease. Vertically oriented fracture through L2 with retropulsion and moderate canal stenosis as before. IMPRESSION: 1. Biliary stent in place. Overall decreased pneumobilia since prior CT with minimal foci of gas present. Interval increase in intra hepatic biliary dilatation; constellation of findings is concerning for stent occlusion. 2. Increased size of hepatic metastatic lesions consistent with disease progression. 3. Right infrahilar soft tissue density/presumed mass and collapse of the right lower lobe as before. Trace pleural effusion 4. Subtle increased nodularity of the left adrenal gland is also concerning for metastatic disease. 5. Widespread skeletal metastatic disease with pathologic L2 fracture, retropulsion, and canal stenosis as noted on the prior exams. Electronically Signed   By: Donavan Foil M.D.   On: 04/12/2018 23:40   Dg Chest Port 1 View  Result Date: 04/12/2018 CLINICAL DATA:  Fever and cough EXAM: PORTABLE CHEST 1 VIEW COMPARISON:  03/01/2018, 12/19/2017 FINDINGS: Right-sided central venous port tip overlies the cavoatrial region. No acute airspace disease or effusion. Stable cardiomediastinal silhouette. No pneumothorax. Minimal atelectasis or scar at the left base. IMPRESSION: No active disease. Electronically Signed   By: Donavan Foil M.D.   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 Imaging suggestion of potential relative obstruction of the bile duct stent. EXAM: ERCP TECHNIQUE: Multiple spot images obtained with the  fluoroscopic device and submitted for interpretation post-procedure. COMPARISON:  CT of the abdomen on 04/12/2018 FINDINGS: C-arm imaging during the endoscopic procedure demonstrates balloon dilatation of the indwelling metallic common bile duct stent. During the procedure there appears to be placement of an overlapping metallic stent. Occlusion cholangiogram shows opacification of the biliary tree proximal to the stented segment. IMPRESSION: Imaging obtained during balloon dilatation of an indwelling metallic common bile duct stent and placement of new overlapping metallic biliary stent. These images were submitted for radiologic interpretation only. Please see the procedural report for the  amount of contrast and the fluoroscopy time utilized. Electronically Signed   By: Aletta Edouard M.D.   On: 04/15/2018 07:56   [place] Ekg Site Rite  Result Date: 04/17/2018 If Site Rite image not attached, placement could not be confirmed due to current cardiac rhythm.       This case was discussed with Dr. Irene Limbo. He expresses agreement with my management of this patient.

## 2018-05-01 NOTE — Patient Instructions (Signed)
Hyponatremia Hyponatremia is when the amount of salt (sodium) in your blood is too low. When salt levels are low, your cells absorb extra water and they swell. The swelling happens throughout the body, but it mostly affects the brain. Follow these instructions at home:  Take medicines only as told by your doctor. Many medicines can make this condition worse. Talk with your doctor about any medicines that you are currently taking.  Carefully follow a recommended diet as told by your doctor.  Carefully follow instructions from your doctor about fluid restrictions.  Keep all follow-up visits as told by your doctor. This is important.  Do not drink alcohol. Contact a doctor if:  You feel sicker to your stomach (nauseous).  You feel more confused.  You feel more tired (fatigued).  Your headache gets worse.  You feel weaker.  Your symptoms go away and then they come back.  You have trouble following the diet instructions. Get help right away if:  You start to twitch and shake (have a seizure).  You pass out (faint).  You keep having watery poop (diarrhea).  You keep throwing up (vomiting). This information is not intended to replace advice given to you by your health care provider. Make sure you discuss any questions you have with your health care provider. Document Released: 04/24/2011 Document Revised: 01/18/2016 Document Reviewed: 08/08/2014 Elsevier Interactive Patient Education  2018 [employer] Inc[employer]  

## 2018-05-01 NOTE — H&P (Signed)
Patient Demographics:    Haley [name], is a 67 y.o. 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  Consider 3% saline solution if neuro symptoms or significantly worsening hyponatremia despite above-mentioned.  TSH is normal, magnesium is low, phosphorus and potassium are normal normal.  Resume patient's home hydrocortisone dose hold off on stress dose at this time  2)Small cell carcinoma the lung, extended stag-not currently receiving chemo or radiation therapy, Primary lesion was likely a right infrahilar mass.  Metastatic lesions to liver (biopsy confirmed), bone (pathologic L2 fracture) and possibly left adrenal (nodule on CT) and brain (abnormal head CT)-    patient follow-up with oncologist Dr. Barrie Lyme   With History of - Reviewed by me  Past Medical History:  Diagnosis Date  . Allergy   . Back pain   . Osteopenia   . Small cell lung cancer ([place]) 10/17/2017   Extended stage with metastases to liver and bone  . Tubular adenoma of colon 10/26/2014      Past Surgical History:  Procedure Laterality Date  . BILIARY  STENT PLACEMENT N/A 04/14/2018   Procedure: BILIARY STENT PLACEMENT;  Surgeon: [name], MD;  Location: [facility];  Service: Gastroenterology;  Laterality: N/A;  . COLONOSCOPY    . COLONOSCOPY WITH PROPOFOL N/A 10/25/2014   Procedure: COLONOSCOPY WITH PROPOFOL;  Surgeon: Garlan Fair, MD;  Location: [facility];  Service: Endoscopy;  Laterality: N/A;  . ERCP N/A 10/15/2017   Procedure: ENDOSCOPIC RETROGRADE CHOLANGIOPANCREATOGRAPHY (ERCP);  Surgeon: Clarene Essex, MD;  Location: [facility];  Service: Endoscopy;  Laterality: N/A;  . ERCP N/A 04/14/2018   Procedure: ENDOSCOPIC RETROGRADE CHOLANGIOPANCREATOGRAPHY (ERCP);  Surgeon: [name], MD;  Location: [facility];  Service: Gastroenterology;  Laterality: N/A;  with stent change and Balloon Sweep  . IR FLUORO GUIDE PORT INSERTION RIGHT  11/10/2017  . IR RADIOLOGIST EVAL & MGMT  10/02/2017  . IR US GUIDE VASC ACCESS RIGHT  11/10/2017  . PORT-A-CATH REMOVAL N/A 04/16/2018   Procedure: REMOVAL PORT-A-CATH;  Surgeon: Clovis Riley, MD;  Location: [facility];  Service: General;  Laterality: N/A;      No chief complaint on file.     HPI:    Haley [name]  is a 67 y.o. female  with hx of  metastatic extensive stage small call lung ca including bone, hepatic and pancreatic lesions in feb 2019 , patient has completed 4 cycles of carboplatin/etoposide/Atezolizumab --currently no longer undergoing chemotherapy, pt of Dr Irene Limbo....she is sent over from oncology clinic due to persistent hyponatremia she was seen at the oncology symptom management clinic on 04/30/2018 and given IV fluids for sodium of 117 she returned 05/01/18 to the oncology clinic for further evaluation she was given IV fluids again 1 L bag and Na came back at 116-----  Oncology team requested admission to the hospital for further management of her hyponatremia  No fever no chills no productive cough no shortness of breath at rest  Denies excessive water intake, no vomiting she  denies abdominal pain, she does have nausea, no diarrhea  No fatigue malaise no headaches no visual disturbance no confusion episodes no mental status concerns     Review of systems:    In addition to the HPI above,   A full Review of  Systems was done, all other systems reviewed are negative except as noted above in HPI , .    Social History:  Reviewed by me    Social History   Tobacco Use  . Smoking status: Former Smoker    Packs/day: 1.00    Years: 40.00    Pack years: 40.00    Types: Cigarettes    Last attempt to quit: 09/24/2017    Years since quitting: 0.6  . Smokeless tobacco: Never Used  Substance Use Topics  . Alcohol use: Yes    Comment: rare social     Family History :  Reviewed by me    Family History  Problem Relation Age of Onset  . Diabetes Mother   . Hypertension Mother   . Diabetes Sister   . Hypertension Sister   . Hyperlipidemia Sister      Home Medications:   Prior to Admission medications   Medication Sig Start Date End Date Taking? Authorizing Provider  calcium carbonate (OS-CAL - DOSED IN MG OF ELEMENTAL CALCIUM) 1250 (500 Ca) MG tablet Take 1 tablet by mouth daily.     [provider]  hydrocortisone (CORTEF) 10 MG tablet Take 2 tablets (20 mg total) by mouth daily with breakfast. And 10mg  po daily with lunch. Patient taking differently: Take 10-20 mg by mouth See admin instructions. 20 mg every morning And 10mg  with lunch. 03/23/18   Brunetta Genera, MD  lidocaine-prilocaine (EMLA) cream Apply to affected area once 10/22/17   Brunetta Genera, MD  LORazepam (ATIVAN) 0.5 MG tablet Take 1 tablet (0.5 mg total) by mouth every 6 (six) hours as needed (Nausea or vomiting). 10/22/17   Brunetta Genera, MD  magic mouthwash w/lidocaine SOLN Take 10 mLs by mouth 4 (four) times daily as needed for mouth pain. Rinse and spit or swallow 12/08/17   Tanner, Lyndon Code., PA-C  metoCLOPramide (REGLAN) 10 MG tablet Take 1 tablet (10 mg  total) by mouth every 8 (eight) hours as needed for nausea. 03/16/18   Tanner, Lyndon Code., PA-C  Multiple Vitamins-Minerals (MULTIVITAMIN WITH MINERALS) tablet Take 1 tablet by mouth daily.    [provider]  ondansetron (ZOFRAN) 4 MG tablet Take 1 tablet (4 mg total) by mouth every 8 (eight) hours as needed for nausea or vomiting. 10/18/17   Bonnielee Haff, MD  oxyCODONE (OXY IR/ROXICODONE) 5 MG immediate release tablet Take 1-2 tablets (5-10 mg total) by  mouth every 4 (four) hours as needed for moderate pain or severe pain. 03/23/18   Brunetta Genera, MD  polyethylene glycol [facility]) packet Take 17 g by mouth daily as needed for moderate constipation. 10/18/17   Bonnielee Haff, MD  potassium chloride SA (K-DUR,KLOR-CON) 20 MEQ tablet Take 1 tablet (20 mEq total) by mouth daily. Take for 7 days then repeat labs. 04/23/18   Susy Frizzle, MD  prochlorperazine (COMPAZINE) 10 MG tablet Take 1 tablet (10 mg total) by mouth every 6 (six) hours as needed (Nausea or vomiting). 10/22/17   Brunetta Genera, MD  senna-docusate (SENNA S) 8.6-50 MG tablet Take 2 tablets by mouth at bedtime. Patient taking differently: Take 2 tablets by mouth at bedtime as needed for mild constipation or moderate constipation.  09/30/17   Brunetta Genera, MD  sucralfate (CARAFATE) 1 GM/10ML suspension Take 10 mLs (1 g total) by mouth 3 (three) times daily. With meals 04/06/18   Alycia Rossetti, MD     Allergies:    No Known Allergies   Physical Exam:   Vitals  Blood pressure 121/74, pulse 79, temperature 97.9 F (36.6 C), temperature source Oral, resp. rate 20, height 5\' 3"  (1.6 m), weight 54.3 kg, SpO2 99 %.  Physical Examination: General appearance - alert, chronically ill appearing, and in no distress  Mental status - alert, oriented to person, place, and time,  Eyes - sclera anicteric Neck - supple, no JVD elevation , Chest - clear  to auscultation bilaterally, symmetrical air movement,  Heart  - S1 and S2 normal,  Abdomen - soft, nontender, nondistended, no masses or organomegaly Neurological - screening mental status exam normal, neck supple without rigidity, cranial nerves II through XII intact, DTR's normal and symmetric Extremities - no pedal edema noted, intact peripheral pulses , left arm PICC line site is clean dry and intact Skin - warm, dry     Data Review:    CBC Recent Labs  Lab 04/30/18 0936 05/01/18 0949  WBC 6.6 7.4  HGB 11.8 11.1*  HCT 35.2 32.8*  PLT 415* 374  MCV 84.5 83.9  MCH 28.4 28.4  MCHC 33.6 33.9  RDW 16.7* 16.5*  LYMPHSABS 1.1 0.6*  MONOABS 0.7 0.6  EOSABS 0.0 0.0  BASOSABS 0.1 0.0   ------------------------------------------------------------------------------------------------------------------  Chemistries  Recent Labs  Lab 04/30/18 0936 05/01/18 0949  NA 117* 116*  K 4.7 4.1  CL 84* 84*  CO2 23 23  GLUCOSE 88 90  BUN 10 6*  CREATININE 0.69 0.55  CALCIUM 9.0 8.8*  MG  --  1.6*  AST 23  --   ALT 24  --   ALKPHOS 187*  --   BILITOT 0.9  --    ------------------------------------------------------------------------------------------------------------------ estimated creatinine clearance is 58 mL/min (by C-G formula based on SCr of 0.55 mg/dL). ------------------------------------------------------------------------------------------------------------------ Recent Labs    05/01/18 0949  TSH 1.115     Coagulation profile No results for input(s): INR, PROTIME in the last 168 hours. ------------------------------------------------------------------------------------------------------------------- No results for input(s): DDIMER in the last 72 hours. -------------------------------------------------------------------------------------------------------------------  Cardiac Enzymes No results for input(s): CKMB, TROPONINI, MYOGLOBIN in the last 168 hours.  Invalid input(s):  CK ------------------------------------------------------------------------------------------------------------------ No results found for: BNP   ---------------------------------------------------------------------------------------------------------------  Urinalysis    Component Value Date/Time   COLORURINE YELLOW 04/23/2018 1001   APPEARANCEUR CLOUDY (A) 04/23/2018 1001   LABSPEC 1.034 04/23/2018 1001   PHURINE 5.5 04/23/2018 1001   GLUCOSEU NEGATIVE 04/23/2018 1001   HGBUR TRACE (A) 04/23/2018  Gas 04/12/2018 2217   KETONESUR 1+ (A) 04/23/2018 1001   PROTEINUR 2+ (A) 04/23/2018 1001   NITRITE NEGATIVE 04/23/2018 1001   LEUKOCYTESUR TRACE (A) 04/23/2018 1001    ----------------------------------------------------------------------------------------------------------------   Imaging Results:    No results found.  Radiological Exams on Admission: No results found.  DVT Prophylaxis -SCD   AM Labs Ordered, also please review Full Orders  Family Communication: Admission, patients condition and plan of care including tests being ordered have been discussed with the patient who indicate understanding and agree with the plan   Code Status - Full Code  Likely DC to  home  Condition   stable  Roxan Hockey M.D on 05/01/2018 at 2:05 PM Pager---[phone] Go to [email or web address] - [email or web address] for contact info  [facility] - Office  [phone]

## 2018-05-01 NOTE — Progress Notes (Signed)
[facility] Progress Note   Haley [name] [name] [id number] Jan 02, 1951 67 y.o.  Haley [name] [name] is managed by Dr. 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  The patient was given 2 L of normal saline IV today.  She will return tomorrow for additional IV hydration with normal saline with a repeat chemistry panel.  Extensive stage small cell lung cancer: Haley [name] [name] continues to be followed by Dr. Sullivan Lone and is currently under no active treatment.  She currently does not have a follow-up appointment with Dr. Sullivan Lone.  This will be communicated to him.  Hypokalemia: The patient continues on supplemental potassium chloride.  Potassium returned today at 4.7.  The patient was told to continue on her oral potassium chloride supplement.  Hypomagnesemia and hypophosphatemia: A magnesium level and phosphorus level will be collected tomorrow.  Please see After Visit Summary for patient specific instructions.  Future Appointments  Date Time Provider [place]  05/01/2018  9:15 AM [facility] 2 [facility] None  05/01/2018  9:30 AM [facility] 2 [facility] None  05/05/2018  1:15 PM Renato Shin, MD [facility] None  06/02/2018 11:00 AM Arfeen, Arlyce Harman, MD [facility] None  08/04/2018 10:30 AM [place], Modena Nunnery, MD [facility] None    Orders Placed This Encounter  Procedures  . Magnesium  . Phosphorus       Subjective:   Patient ID:  Haley [name] [name] is a 67 y.o. (DOB [birth year])  female.  Chief Complaint:  Chief Complaint  Patient presents with  . Fatigue    HPI Haley [name] [name] is a 67 year old female with a history of an extensive stage small cell lung cancer who is followed by Dr. Sullivan Lone and is currently managed with watchful waiting.  She was most recently admitted from 04/12/2018 through 04/18/2018 for sepsis secondary to cholangitis and Pseudomonas bacteremia.  She also was noted to have hyponatremia, hypokalemia, hypomagnesemia, and hypophosphatemia.  She presents to the office today with dehydration and anorexia.  She reports that she is cold but denies fevers or diaphoresis.  Medications: I have reviewed the patient's current medications.  Allergies: No Known Allergies  Past Medical History:  Diagnosis Date  . Allergy   . Back pain   . Osteopenia   . Small cell lung cancer ([place]) 10/17/2017   Extended stage with metastases to liver and bone  . Tubular adenoma of colon 10/26/2014    Past Surgical History:  Procedure Laterality Date  . BILIARY STENT PLACEMENT N/A 04/14/2018   Procedure: BILIARY STENT PLACEMENT;  Surgeon: [name], MD;  Location: [facility];  Service: Gastroenterology;  Laterality: N/A;  . COLONOSCOPY    . COLONOSCOPY WITH PROPOFOL N/A 10/25/2014   Procedure: COLONOSCOPY WITH PROPOFOL;  Surgeon: Garlan Fair, MD;  Location: [facility];  Service: Endoscopy;  Laterality: N/A;  . ERCP N/A 10/15/2017   Procedure: ENDOSCOPIC RETROGRADE CHOLANGIOPANCREATOGRAPHY (ERCP);  Surgeon: Clarene Essex, MD;  Location: [facility];  Service: Endoscopy;  Laterality: N/A;  . ERCP N/A 04/14/2018   Procedure: ENDOSCOPIC RETROGRADE  CHOLANGIOPANCREATOGRAPHY (ERCP);  Surgeon: [name], MD;  Location: [facility];  Service: Gastroenterology;  Laterality: N/A;  with stent change and Balloon Sweep  . IR FLUORO GUIDE PORT INSERTION RIGHT  11/10/2017  . IR RADIOLOGIST EVAL & MGMT  10/02/2017  . IR US GUIDE VASC ACCESS RIGHT  11/10/2017  . PORT-A-CATH  REMOVAL N/A 04/16/2018   Procedure: REMOVAL PORT-A-CATH;  Surgeon: Clovis Riley, MD;  Location: [facility];  Service: General;  Laterality: N/A;    Family History  Problem Relation Age of Onset  . Diabetes Mother   . Hypertension Mother   . Diabetes Sister   . Hypertension Sister   . Hyperlipidemia Sister     Social History   Socioeconomic History  . Marital status: Single    Spouse name: Not on file  . Number of children: Not on file  . Years of education: Not on file  . Highest education level: Not on file  Occupational History  . Not on file  Social Needs  . Financial resource strain: Not on file  . Food insecurity:    Worry: Not on file    Inability: Not on file  . Transportation needs:    Medical: Not on file    Non-medical: Not on file  Tobacco Use  . Smoking status: Former Smoker    Packs/day: 1.00    Years: 40.00    Pack years: 40.00    Types: Cigarettes    Last attempt to quit: 09/24/2017    Years since quitting: 0.6  . Smokeless tobacco: Never Used  Substance and Sexual Activity  . Alcohol use: Yes    Comment: rare social  . Drug use: No  . Sexual activity: Not Currently  Lifestyle  . Physical activity:    Days per week: Not on file    Minutes per session: Not on file  . Stress: Not on file  Relationships  . Social connections:    Talks on phone: Not on file    Gets together: Not on file    Attends religious service: Not on file    Active member of club or organization: Not on file    Attends meetings of clubs or organizations: Not on file    Relationship status: Not on file  . Intimate partner violence:    Fear of current or ex partner: Not on file    Emotionally abused: Not on file    Physically abused: Not on file    Forced sexual activity: Not on file  Other Topics Concern  . Not on file  Social History Narrative  . Not on file    Past Medical History, Surgical history, Social history, and Family history were reviewed and updated as  appropriate.   Please see review of systems for further details on the patient's review from today.   Review of Systems:  Review of Systems  Constitutional: Positive for appetite change and chills. Negative for diaphoresis, fever and unexpected weight change.  HENT: Negative for trouble swallowing.   Respiratory: Negative for cough, chest tightness and shortness of breath.   Cardiovascular: Negative for chest pain and palpitations.  Gastrointestinal: Negative for abdominal pain, constipation, diarrhea, nausea and vomiting.  Neurological: Negative for dizziness and headaches.    Objective:   Physical Exam:  BP 104/67 (BP Location: Left Arm, Patient Position: Sitting)   Pulse 74   Temp 97.6 F (36.4 C) (Oral)   Resp 18   Ht 5\' 3"  (1.6 m)   Wt  118 lb 1.6 oz (53.6 kg)   SpO2 98%   BMI 20.92 kg/m  ECOG: 1  Physical Exam  Constitutional: No distress.  HENT:  Head: Normocephalic and atraumatic.  Mucous membranes are dry.  Cardiovascular: Normal rate, regular rhythm and normal heart sounds. Exam reveals no gallop and no friction rub.  No murmur heard. Pulmonary/Chest: Effort normal and breath sounds normal. No respiratory distress. She has no wheezes. She has no rales.  Neurological: She is alert. Coordination normal.  Skin: Skin is warm and dry. No rash noted. She is not diaphoretic. No erythema.  Psychiatric: She has a normal mood and affect. Her behavior is normal. Judgment and thought content normal.    Lab Review:     Component Value Date/Time   NA 117 (L) 04/30/2018 0936   K 4.7 04/30/2018 0936   CL 84 (L) 04/30/2018 0936   CO2 23 04/30/2018 0936   GLUCOSE 88 04/30/2018 0936   BUN 10 04/30/2018 0936   CREATININE 0.69 04/30/2018 0936   CREATININE 0.80 05/12/2017 1110   CALCIUM 9.0 04/30/2018 0936   CALCIUM 6.7 03/02/2018 1125   PROT 6.9 04/30/2018 0936   ALBUMIN 3.3 (L) 04/30/2018 0936   AST 23 04/30/2018 0936   ALT 24 04/30/2018 0936   ALKPHOS 187 (H)  04/30/2018 0936   BILITOT 0.9 04/30/2018 0936   GFRNONAA >60 04/30/2018 0936   GFRNONAA 81 05/03/2016 0827   GFRAA >60 04/30/2018 0936   GFRAA >89 05/03/2016 0827       Component Value Date/Time   WBC 6.6 04/30/2018 0936   WBC 6.3 04/17/2018 0959   RBC 4.17 04/30/2018 0936   HGB 11.8 04/30/2018 0936   HCT 35.2 04/30/2018 0936   PLT 415 (H) 04/30/2018 0936   MCV 84.5 04/30/2018 0936   MCH 28.4 04/30/2018 0936   MCHC 33.6 04/30/2018 0936   RDW 16.7 (H) 04/30/2018 0936   LYMPHSABS 1.1 04/30/2018 0936   MONOABS 0.7 04/30/2018 0936   EOSABS 0.0 04/30/2018 0936   BASOSABS 0.1 04/30/2018 0936   -------------------------------  Imaging from last 24 hours (if applicable):  Radiology interpretation: Ct Head Wo Contrast  Result Date: 04/12/2018 CLINICAL DATA:  History of liver cancer and small cell lung cancer. Last chemotherapy 2 weeks ago. Abdominal pain and altered mental status. EXAM: CT HEAD WITHOUT CONTRAST TECHNIQUE: Contiguous axial images were obtained from the base of the skull through the vertex without intravenous contrast. COMPARISON:  02/24/2018 FINDINGS: Brain: No mass effect or midline shift. No abnormal extra-axial fluid collections. No ventricular dilatation. There is vague low-attenuation change in the left anterior frontal white matter. This is similar to previous study and likely represents an area of small vessel ischemic change. If there is high clinical suspicion for intracranial metastasis, then contrast-enhanced MR would be the more sensitive examination. Gray-white matter junctions are distinct. Basal cisterns are not effaced. No acute intracranial hemorrhage. Vascular: Mild intracranial arterial calcifications are present. Skull: Calvarium appears intact. No acute depressed skull fractures or destructive bone lesions identified. Sinuses/Orbits: There is an air-fluid level in the left sphenoid sinus, similar previous study. This is likely inflammatory. Paranasal  sinuses and mastoid air cells are otherwise clear. Other: None. IMPRESSION: No acute intracranial abnormalities. Focal low-attenuation change in the left anterior frontal white matter is similar to previous study and likely represents an area of small vessel ischemic change. If there is high clinical suspicion for intracranial metastasis, then contrast-enhanced MR would be the more sensitive examination. Electronically Signed  By: Lucienne Capers M.D.   On: 04/12/2018 23:24   Ct Abdomen Pelvis W Contrast  Result Date: 04/12/2018 CLINICAL DATA:  Liver cancer abdominal pain EXAM: CT ABDOMEN AND PELVIS WITH CONTRAST TECHNIQUE: Multidetector CT imaging of the abdomen and pelvis was performed using the standard protocol following bolus administration of intravenous contrast. CONTRAST:  65mL ISOVUE-300 IOPAMIDOL (ISOVUE-300) INJECTION 61% COMPARISON:  MRI 02/25/2018, PET-CT 12/26/2017, CT 05/23/2017 FINDINGS: Lower chest: Lung bases demonstrate tiny pleural effusions. Passive atelectasis in the posterior left lung base. Persistent collapse in the right lower lobe with occlusion of the bronchus as before. Heart size within normal limits. Coronary vascular calcification. Hepatobiliary: Increased intra hepatic biliary dilatation since prior study. Increased size of hypodense metastatic liver lesions. Index posterior right hepatic lobe lesion now measures 23 mm compared with 10 mm previously. A left lobe lesion measures 16 mm, compared with 9 mm previously. Decreased pneumobilia with only minimal foci of pneumobilia evident. No air visible within the biliary stent except at its duodenal termination. Minimal wall thickening of the gallbladder. No calcified stones. Pancreas: No inflammatory changes.  No measurable mass. Spleen: Normal in size without focal abnormality. Adrenals/Urinary Tract: Right adrenal gland is within normal limits. Subtle increased nodularity of the left adrenal gland no hydronephrosis. The bladder  is distended Stomach/Bowel: The stomach is nonenlarged. No dilated small bowel. No colon wall thickening. Vascular/Lymphatic: Moderate to marked aortic atherosclerosis. No aneurysmal dilatation. No definitive adenopathy. Reproductive: Uterus and bilateral adnexa are unremarkable. Other: Negative for free air. Tiny amount of free fluid in the pelvis. Musculoskeletal: Extensive skeletal metastatic disease. Vertically oriented fracture through L2 with retropulsion and moderate canal stenosis as before. IMPRESSION: 1. Biliary stent in place. Overall decreased pneumobilia since prior CT with minimal foci of gas present. Interval increase in intra hepatic biliary dilatation; constellation of findings is concerning for stent occlusion. 2. Increased size of hepatic metastatic lesions consistent with disease progression. 3. Right infrahilar soft tissue density/presumed mass and collapse of the right lower lobe as before. Trace pleural effusion 4. Subtle increased nodularity of the left adrenal gland is also concerning for metastatic disease. 5. Widespread skeletal metastatic disease with pathologic L2 fracture, retropulsion, and canal stenosis as noted on the prior exams. Electronically Signed   By: Donavan Foil M.D.   On: 04/12/2018 23:40   Dg Chest Port 1 View  Result Date: 04/12/2018 CLINICAL DATA:  Fever and cough EXAM: PORTABLE CHEST 1 VIEW COMPARISON:  03/01/2018, 12/19/2017 FINDINGS: Right-sided central venous port tip overlies the cavoatrial region. No acute airspace disease or effusion. Stable cardiomediastinal silhouette. No pneumothorax. Minimal atelectasis or scar at the left base. IMPRESSION: No active disease. Electronically Signed   By: Donavan Foil M.D.   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 Imaging suggestion of potential relative  obstruction of the bile duct stent. EXAM: ERCP TECHNIQUE: Multiple spot images obtained with the fluoroscopic device and submitted for interpretation post-procedure. COMPARISON:  CT of the abdomen on 04/12/2018 FINDINGS: C-arm imaging during the endoscopic procedure demonstrates balloon dilatation of the indwelling metallic common bile duct stent. During the procedure there appears to be placement of an overlapping metallic stent. Occlusion cholangiogram shows opacification of the biliary tree proximal to the stented segment. IMPRESSION: Imaging obtained during balloon dilatation of an indwelling metallic common bile duct stent and placement of new overlapping  metallic biliary stent. These images were submitted for radiologic interpretation only. Please see the procedural report for the amount of contrast and the fluoroscopy time utilized. Electronically Signed   By: Aletta Edouard M.D.   On: 04/15/2018 07:56   [place] Ekg Site Rite  Result Date: 04/17/2018 If Site Rite image not attached, placement could not be confirmed due to current cardiac rhythm.

## 2018-05-01 NOTE — Progress Notes (Signed)
CRITICAL VALUE ALERT  Critical Value:  Na 119  Date & Time Notied:  05/01/18 1825  Provider Notified: Dr. Denton Brick  Orders Received/Actions taken: Continue treatment

## 2018-05-01 NOTE — Telephone Encounter (Signed)
RN Elberta to let them know Dr Baxter Flattery would defer to Dr Irene Limbo. Patient is currently hospitalized at [facility].

## 2018-05-02 ENCOUNTER — Inpatient Hospital Stay (HOSPITAL_COMMUNITY): Payer: Medicare Other

## 2018-05-02 ENCOUNTER — Encounter (HOSPITAL_COMMUNITY): Payer: Self-pay | Admitting: Radiology

## 2018-05-02 DIAGNOSIS — E43 Unspecified severe protein-calorie malnutrition: Secondary | ICD-10-CM | POA: Diagnosis present

## 2018-05-02 DIAGNOSIS — Z7189 Other specified counseling: Secondary | ICD-10-CM

## 2018-05-02 DIAGNOSIS — E222 Syndrome of inappropriate secretion of antidiuretic hormone: Secondary | ICD-10-CM | POA: Diagnosis present

## 2018-05-02 DIAGNOSIS — E2740 Unspecified adrenocortical insufficiency: Secondary | ICD-10-CM | POA: Diagnosis present

## 2018-05-02 DIAGNOSIS — C7972 Secondary malignant neoplasm of left adrenal gland: Secondary | ICD-10-CM | POA: Diagnosis present

## 2018-05-02 DIAGNOSIS — C3490 Malignant neoplasm of unspecified part of unspecified bronchus or lung: Secondary | ICD-10-CM

## 2018-05-02 DIAGNOSIS — C3491 Malignant neoplasm of unspecified part of right bronchus or lung: Secondary | ICD-10-CM | POA: Diagnosis present

## 2018-05-02 DIAGNOSIS — C7951 Secondary malignant neoplasm of bone: Secondary | ICD-10-CM

## 2018-05-02 DIAGNOSIS — E871 Hypo-osmolality and hyponatremia: Secondary | ICD-10-CM

## 2018-05-02 DIAGNOSIS — Z8349 Family history of other endocrine, nutritional and metabolic diseases: Secondary | ICD-10-CM | POA: Diagnosis not present

## 2018-05-02 DIAGNOSIS — Z8249 Family history of ischemic heart disease and other diseases of the circulatory system: Secondary | ICD-10-CM | POA: Diagnosis not present

## 2018-05-02 DIAGNOSIS — C7970 Secondary malignant neoplasm of unspecified adrenal gland: Secondary | ICD-10-CM

## 2018-05-02 DIAGNOSIS — C7931 Secondary malignant neoplasm of brain: Secondary | ICD-10-CM | POA: Diagnosis present

## 2018-05-02 DIAGNOSIS — C787 Secondary malignant neoplasm of liver and intrahepatic bile duct: Secondary | ICD-10-CM

## 2018-05-02 DIAGNOSIS — R627 Adult failure to thrive: Secondary | ICD-10-CM | POA: Diagnosis present

## 2018-05-02 DIAGNOSIS — M8580 Other specified disorders of bone density and structure, unspecified site: Secondary | ICD-10-CM | POA: Diagnosis present

## 2018-05-02 DIAGNOSIS — Z923 Personal history of irradiation: Secondary | ICD-10-CM | POA: Diagnosis not present

## 2018-05-02 DIAGNOSIS — M8448XA Pathological fracture, other site, initial encounter for fracture: Secondary | ICD-10-CM | POA: Diagnosis present

## 2018-05-02 DIAGNOSIS — E876 Hypokalemia: Secondary | ICD-10-CM | POA: Diagnosis present

## 2018-05-02 DIAGNOSIS — K831 Obstruction of bile duct: Secondary | ICD-10-CM | POA: Diagnosis present

## 2018-05-02 DIAGNOSIS — Z87891 Personal history of nicotine dependence: Secondary | ICD-10-CM | POA: Diagnosis not present

## 2018-05-02 DIAGNOSIS — W19XXXA Unspecified fall, initial encounter: Secondary | ICD-10-CM | POA: Diagnosis present

## 2018-05-02 DIAGNOSIS — Z6820 Body mass index (BMI) 20.0-20.9, adult: Secondary | ICD-10-CM

## 2018-05-02 DIAGNOSIS — Z9221 Personal history of antineoplastic chemotherapy: Secondary | ICD-10-CM | POA: Diagnosis not present

## 2018-05-02 DIAGNOSIS — Z833 Family history of diabetes mellitus: Secondary | ICD-10-CM | POA: Diagnosis not present

## 2018-05-02 LAB — BASIC METABOLIC PANEL
Anion gap: 11 (ref 5–15)
Anion gap: 9 (ref 5–15)
BUN: <5 mg/dL — ABNORMAL LOW (ref 8–23)
CALCIUM: 7.5 mg/dL — AB (ref 8.9–10.3)
CO2: 23 mmol/L (ref 22–32)
CO2: 22 mmol/L (ref 22–32)
Calcium: 7.8 mg/dL — ABNORMAL LOW (ref 8.9–10.3)
Chloride: 86 mmol/L — ABNORMAL LOW (ref 98–111)
Chloride: 90 mmol/L — ABNORMAL LOW (ref 98–111)
Creatinine, Ser: <0.3 mg/dL — ABNORMAL LOW (ref 0.44–1.00)
Creatinine, Ser: 0.34 mg/dL — ABNORMAL LOW (ref 0.44–1.00)
GFR calc Af Amer: >60 mL/min (ref >60)
GLUCOSE: 75 mg/dL (ref 70–99)
GLUCOSE: 119 mg/dL — AB (ref 70–99)
POTASSIUM: 4.1 mmol/L (ref 3.5–5.1)
Potassium: 2.8 mmol/L — ABNORMAL LOW (ref 3.5–5.1)
Sodium: 120 mmol/L — ABNORMAL LOW (ref 135–145)
Sodium: 121 mmol/L — ABNORMAL LOW (ref 135–145)

## 2018-05-02 LAB — CBC
HCT: 29 % — ABNORMAL LOW (ref 36.0–46.0)
Hemoglobin: 10.2 g/dL — ABNORMAL LOW (ref 12.0–15.0)
MCH: 28.7 pg (ref 26.0–34.0)
MCHC: 35.2 g/dL (ref 30.0–36.0)
MCV: 81.5 fL (ref 78.0–100.0)
Platelets: 354 10*3/uL (ref 150–400)
RBC: 3.56 MIL/uL — ABNORMAL LOW (ref 3.87–5.11)
RDW: 15 % (ref 11.5–15.5)
WBC: 6.5 10*3/uL (ref 4.0–10.5)

## 2018-05-02 LAB — MAGNESIUM: MAGNESIUM: 1.9 mg/dL (ref 1.7–2.4)

## 2018-05-02 MED ORDER — IOPAMIDOL (ISOVUE-300) INJECTION 61%
30.0000 mL | Freq: Once | INTRAVENOUS | Status: AC | PRN
  Administered 2018-05-02: 30 mL via ORAL

## 2018-05-02 MED ORDER — IOHEXOL 300 MG/ML  SOLN
100.0000 mL | Freq: Once | INTRAMUSCULAR | Status: AC | PRN
  Administered 2018-05-02: 100 mL via INTRAVENOUS

## 2018-05-02 MED ORDER — SODIUM CHLORIDE 1 G PO TABS
1.0000 g | ORAL_TABLET | Freq: Three times a day (TID) | ORAL | Status: DC
  Administered 2018-05-02 – 2018-05-03 (×3): 1 g via ORAL
  Filled 2018-05-02 (×4): qty 1

## 2018-05-02 MED ORDER — GADOBUTROL 1 MMOL/ML IV SOLN
5.0000 mL | Freq: Once | INTRAVENOUS | Status: AC | PRN
  Administered 2018-05-02: 5 mL via INTRAVENOUS

## 2018-05-02 MED ORDER — POTASSIUM CHLORIDE 10 MEQ/100ML IV SOLN
10.0000 meq | INTRAVENOUS | Status: AC
  Administered 2018-05-02 (×3): 10 meq via INTRAVENOUS
  Filled 2018-05-02 (×3): qty 100

## 2018-05-02 MED ORDER — IOPAMIDOL (ISOVUE-300) INJECTION 61%
INTRAVENOUS | Status: AC
  Filled 2018-05-02: qty 30

## 2018-05-02 NOTE — Progress Notes (Signed)
PROGRESS NOTE [facility]   Haley [name]   BTD:[id number] DOB: 08/10/1951  DOA: 05/01/2018 PCP: Alycia Rossetti, MD   Brief Narrative:  Haley [name] is a 67 y/o female with medical hx extensive metastatic small cell carcinoma of the lung.  Patient was sent from oncology office due to persistent hyponatremia.  Patient was seen on 9/5 due to persistent weakness and dizziness, found to have sodium of 117 was given IV fluid and return it on 9/6 for further evaluation.  Labs show sodium of 116 therefore she was sent to the emergency department for further evaluation and management of hyponatremia.  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  Subjective: Patient seen and examined, she is asymptomatic at this point.  Denies weakness and dizziness.  She is alert and oriented no signs of confusion.  Sodium up to 120 from 116.  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  Check BMP every 12 hours avoid correction above 10 mEq daily.  No neurological symptoms at this time.  TSH normal.  Continue to monitor closely.  Small cell carcinoma of the lung Extended metastases.  Patient completed chemotherapy and radiation.  Outpatient follow-up with Dr. Irene Limbo  Hypokalemia  Replete with IV KCL, check mag  Monitor BMP in AM   DVT prophylaxis: Heparin sq  Code Status: Full Code  Family Communication: None at bedside  Disposition Plan: Home when Na+ improve   Consultants:   None   Procedures:   None  Antimicrobials:  None     Objective: Vitals:   05/01/18 1515 05/01/18 2045 05/02/18 0544 05/02/18 1230  BP: 131/81 (!) 154/86 95/63 105/75  Pulse: 68 67 69 69  Resp: (!) 21 14 18    Temp: 98.3 F (36.8 C) 98.2 F (36.8 C) 98.6 F (37 C) 98 F (36.7 C)  TempSrc: Oral Oral Oral Oral  SpO2: 98% 96% 94% 97%  Weight:      Height:        Intake/Output  Summary (Last 24 hours) at 05/02/2018 1428 Last data filed at 05/02/2018 0900 Gross per 24 hour  Intake 1824.91 ml  Output 1150 ml  Net 674.91 ml   Filed Weights   05/01/18 1237  Weight: 54.3 kg    Examination:  General exam: Appears calm and comfortable  HEENT: AC/AT, PERRLA, OP moist and clear Respiratory system: Clear to auscultation. No wheezes,crackle or rhonchi Cardiovascular system: S1 & S2 heard, RRR. No JVD, murmurs, rubs or gallops Gastrointestinal system: Abdomen is nondistended, soft and nontender. No organomegaly or masses felt. Normal bowel sounds heard. Central nervous system: Alert and oriented. No focal neurological deficits. Extremities: No pedal edema. Symmetric, strength 5/5   Skin: No rashes, lesions or ulcers Psychiatry: Judgement and insight appear normal. Mood & affect appropriate.    Data Reviewed: I have personally reviewed following labs and imaging studies  CBC: Recent Labs  Lab 04/30/18 0936 05/01/18 0949 05/02/18 0404  WBC 6.6 7.4 6.5  NEUTROABS 4.7 6.2  --   HGB 11.8 11.1* 10.2*  HCT 35.2 32.8* 29.0*  MCV 84.5 83.9 81.5  PLT 415* 374 106   Basic Metabolic Panel: Recent Labs  Lab 04/30/18 0936 05/01/18 0949 05/01/18 1736 05/02/18 0404  NA 117* 116* 119* 120*  K 4.7 4.1 3.8 2.8*  CL 84* 84* 86* 86*  CO2 23 23 25 23   GLUCOSE 88 90 118* 75  BUN 10 6* 6* <5*  CREATININE 0.69 0.55 <0.30* <0.30*  CALCIUM 9.0 8.8* 8.2* 7.5*  MG  --  1.6*  --  1.9  PHOS  --  3.4  --   --    GFR: CrCl cannot be calculated (This lab value cannot be used to calculate CrCl because it is not a number: <0.30). Liver Function Tests: Recent Labs  Lab 04/30/18 0936  AST 23  ALT 24  ALKPHOS 187*  BILITOT 0.9  PROT 6.9  ALBUMIN 3.3*   No results for input(s): LIPASE, AMYLASE in the last 168 hours. No results for input(s): AMMONIA in the last 168 hours. Coagulation Profile: No results for input(s): INR, PROTIME in the last 168 hours. Cardiac  Enzymes: No results for input(s): CKTOTAL, CKMB, CKMBINDEX, TROPONINI in the last 168 hours. BNP (last 3 results) No results for input(s): PROBNP in the last 8760 hours. HbA1C: No results for input(s): HGBA1C in the last 72 hours. CBG: No results for input(s): GLUCAP in the last 168 hours. Lipid Profile: No results for input(s): CHOL, HDL, LDLCALC, TRIG, CHOLHDL, LDLDIRECT in the last 72 hours. Thyroid Function Tests: Recent Labs    05/01/18 0949  TSH 1.115   Anemia Panel: No results for input(s): VITAMINB12, FOLATE, FERRITIN, TIBC, IRON, RETICCTPCT in the last 72 hours. Sepsis Labs: No results for input(s): PROCALCITON, LATICACIDVEN in the last 168 hours.  Recent Results (from the past 240 hour(s))  Urine Culture     Status: None   Collection Time: 04/23/18 10:57 AM  Result Value Ref Range Status   MICRO NUMBER: [id number]  Final   SPECIMEN QUALITY: ADEQUATE  Final   Sample Source URINE  Final   STATUS: FINAL  Final   Result: No Growth  Final     Radiology Studies: No results found.   Scheduled Meds: . calcium carbonate  1 tablet Oral Daily  . heparin  5,000 Units Subcutaneous Q8H  . hydrocortisone  10 mg Oral Q lunch  . hydrocortisone  20 mg Oral Q breakfast  . multivitamin with minerals  1 tablet Oral Daily  . potassium chloride SA  20 mEq Oral Daily  . senna-docusate  2 tablet Oral BID  . sodium chloride flush  3 mL Intravenous Q12H  . sodium chloride  1 g Oral TID WC   Continuous Infusions: . sodium chloride    . sodium chloride 150 mL/hr at 05/02/18 0543     LOS: 1 day   Time spent: Total of 25 minutes spent with pt, greater than 50% of which was spent in discussion of  treatment, counseling and coordination of care   Chipper Oman, MD Pager: Text Page via [email or web address]   If 7PM-7AM, please contact night-coverage [email or web address] 05/02/2018, 2:28 PM   Note - This record has been created using [employer]. Chart creation errors have been sought, but may  not always have been located. Such creation errors do not reflect on the standard of medical care.

## 2018-05-02 NOTE — Plan of Care (Signed)
  Problem: Spiritual Needs Goal: Ability to function at adequate level Outcome: Progressing   Problem: Clinical Measurements: Goal: Ability to maintain clinical measurements within normal limits will improve Outcome: Progressing Goal: Will remain free from infection Outcome: Progressing Goal: Diagnostic test results will improve Outcome: Progressing Goal: Respiratory complications will improve Outcome: Progressing Goal: Cardiovascular complication will be avoided Outcome: Progressing   Problem: Activity: Goal: Risk for activity intolerance will decrease Outcome: Progressing   Problem: Nutrition: Goal: Adequate nutrition will be maintained Outcome: Progressing   Problem: Coping: Goal: Level of anxiety will decrease Outcome: Progressing   Problem: Elimination: Goal: Will not experience complications related to bowel motility Outcome: Progressing   Problem: Pain Managment: Goal: General experience of comfort will improve Outcome: Progressing   Problem: Safety: Goal: Ability to remain free from injury will improve Outcome: Progressing   Problem: Skin Integrity: Goal: Risk for impaired skin integrity will decrease Outcome: Progressing

## 2018-05-03 DIAGNOSIS — R627 Adult failure to thrive: Secondary | ICD-10-CM

## 2018-05-03 DIAGNOSIS — Z7189 Other specified counseling: Secondary | ICD-10-CM

## 2018-05-03 LAB — BASIC METABOLIC PANEL
Anion gap: 10 (ref 5–15)
Anion gap: 8 (ref 5–15)
BUN: <5 mg/dL — ABNORMAL LOW (ref 8–23)
BUN: <5 mg/dL — ABNORMAL LOW (ref 8–23)
CALCIUM: 7.5 mg/dL — AB (ref 8.9–10.3)
CALCIUM: 8.2 mg/dL — AB (ref 8.9–10.3)
CO2: 23 mmol/L (ref 22–32)
CO2: 22 mmol/L (ref 22–32)
CREATININE: 0.31 mg/dL — AB (ref 0.44–1.00)
CREATININE: 0.38 mg/dL — AB (ref 0.44–1.00)
Chloride: 91 mmol/L — ABNORMAL LOW (ref 98–111)
Chloride: 92 mmol/L — ABNORMAL LOW (ref 98–111)
GFR calc Af Amer: >60 mL/min (ref >60)
GFR calc Af Amer: >60 mL/min (ref >60)
GFR calc non Af Amer: >60 mL/min (ref >60)
GFR calc non Af Amer: >60 mL/min (ref >60)
GLUCOSE: 75 mg/dL (ref 70–99)
GLUCOSE: 166 mg/dL — AB (ref 70–99)
Potassium: 2.8 mmol/L — ABNORMAL LOW (ref 3.5–5.1)
Potassium: 4.5 mmol/L (ref 3.5–5.1)
Sodium: 124 mmol/L — ABNORMAL LOW (ref 135–145)
Sodium: 122 mmol/L — ABNORMAL LOW (ref 135–145)

## 2018-05-03 LAB — MAGNESIUM: Magnesium: 1.8 mg/dL (ref 1.7–2.4)

## 2018-05-03 MED ORDER — POTASSIUM CHLORIDE 10 MEQ/100ML IV SOLN
10.0000 meq | INTRAVENOUS | Status: AC
  Administered 2018-05-03 (×6): 10 meq via INTRAVENOUS
  Filled 2018-05-03 (×6): qty 100

## 2018-05-03 MED ORDER — SODIUM CHLORIDE 1 G PO TABS
2.0000 g | ORAL_TABLET | Freq: Three times a day (TID) | ORAL | Status: DC
  Administered 2018-05-03 – 2018-05-05 (×6): 2 g via ORAL
  Filled 2018-05-03 (×7): qty 2

## 2018-05-03 NOTE — Progress Notes (Signed)
Care resumed for this patient from previous RN. Agree with previous assessment. Will continue to monitor.

## 2018-05-03 NOTE — Progress Notes (Signed)
Marland Kitchen   HEMATOLOGY/ONCOLOGY INPATIENT PROGRESS NOTE  Date of Service: 05/03/2018  Inpatient Attending: .Doreatha Lew, MD   SUBJECTIVE  Patient's notes feeling a little better today. Sodium levels gradually improving to 124. MRI brain. CT C/A/P results discussed with the patient and her son at bedside. Concern for progression of SCLC. Patient understandably concerned regarding this but notes that it isnt completely unexpected from her standpoint. No other new symptoms.    OBJECTIVE:  NAD  PHYSICAL EXAMINATION: . Vitals:   05/02/18 1230 05/02/18 2050 05/03/18 0523 05/03/18 1323  BP: 105/75 138/80 118/63 129/79  Pulse: 69 65 78 71  Resp:  18 16 20   Temp: 98 F (36.7 C) 98.2 F (36.8 C) 99.1 F (37.3 C) 99.1 F (37.3 C)  TempSrc: Oral Oral Oral Oral  SpO2: 97% 98% 93% 98%  Weight:      Height:       Filed Weights   05/01/18 1237  Weight: 119 lb 9.6 oz (54.3 kg)   .Body mass index is 21.19 kg/m.  GENERAL:alert, in no acute distress and comfortable, fatigued appearing  sKIN: skin color, texture, turgor are normal, no rashes or significant lesions EYES: normal, conjunctiva are pink and non-injected, sclera clear OROPHARYNX:no exudate, no erythema and lips, buccal mucosa, and tongue normal  NECK: supple, no JVD, thyroid normal size, non-tender, without nodularity LYMPH:  no palpable lymphadenopathy in the cervical, axillary or inguinal LUNGS: clear to auscultation with normal respiratory effort HEART: regular rate & rhythm,  no murmurs and no lower extremity edema ABDOMEN: abdomen soft, non-tender, normoactive bowel sounds  Musculoskeletal: no cyanosis of digits and no clubbing  PSYCH: alert & oriented x 3 with fluent speech NEURO: no focal motor/sensory deficits  MEDICAL HISTORY:  Past Medical History:  Diagnosis Date  . Allergy   . Back pain   . Osteopenia   . 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  Tubular adenoma of colon 10/26/2014    SURGICAL HISTORY: Past Surgical History:  Procedure Laterality Date  . BILIARY STENT PLACEMENT N/A 04/14/2018   Procedure: BILIARY STENT PLACEMENT;  Surgeon: [name], MD;  Location: [facility];  Service: Gastroenterology;  Laterality: N/A;  . COLONOSCOPY    . COLONOSCOPY WITH PROPOFOL N/A 10/25/2014   Procedure: COLONOSCOPY WITH PROPOFOL;  Surgeon: Garlan Fair, MD;  Location: [facility];  Service: Endoscopy;  Laterality: N/A;  . ERCP N/A 10/15/2017   Procedure: ENDOSCOPIC RETROGRADE CHOLANGIOPANCREATOGRAPHY (ERCP);  Surgeon: Clarene Essex, MD;  Location: [facility];  Service: Endoscopy;  Laterality: N/A;  . ERCP N/A 04/14/2018   Procedure: ENDOSCOPIC RETROGRADE CHOLANGIOPANCREATOGRAPHY (ERCP);  Surgeon: [name], MD;  Location: [facility];  Service: Gastroenterology;  Laterality: N/A;  with stent change and Balloon Sweep  . IR FLUORO GUIDE PORT INSERTION RIGHT  11/10/2017  . IR RADIOLOGIST EVAL & MGMT  10/02/2017  . IR US GUIDE VASC ACCESS RIGHT  11/10/2017  . PORT-A-CATH REMOVAL N/A 04/16/2018   Procedure: REMOVAL PORT-A-CATH;  Surgeon: Clovis Riley, MD;  Location: [facility];  Service: General;  Laterality: N/A;    SOCIAL HISTORY: Social History   Socioeconomic History  . Marital status: Single    Spouse name: Not on file  . Number of children: Not on file  . Years of education: Not on file  . Highest education level: Not on file  Occupational History  . Not on file  Social Needs  . Financial resource strain:  Not on file  . Food insecurity:    Worry: Not on file    Inability: Not on file  . Transportation needs:    Medical: Not on file    Non-medical: Not on file  Tobacco Use  . Smoking status: Former Smoker    Packs/day: 1.00    Years: 40.00    Pack years: 40.00    Types: Cigarettes    Last attempt to quit: 09/24/2017    Years since quitting: 0.6  . Smokeless tobacco: Never Used  Substance and Sexual Activity  .  Alcohol use: Yes    Comment: rare social  . Drug use: No  . Sexual activity: Not Currently  Lifestyle  . Physical activity:    Days per week: Not on file    Minutes per session: Not on file  . Stress: Not on file  Relationships  . Social connections:    Talks on phone: Not on file    Gets together: Not on file    Attends religious service: Not on file    Active member of club or organization: Not on file    Attends meetings of clubs or organizations: Not on file    Relationship status: Not on file  . Intimate partner violence:    Fear of current or ex partner: Not on file    Emotionally abused: Not on file    Physically abused: Not on file    Forced sexual activity: Not on file  Other Topics Concern  . Not on file  Social History Narrative  . Not on file    FAMILY HISTORY: Family History  Problem Relation Age of Onset  . Diabetes Mother   . Hypertension Mother   . Diabetes Sister   . Hypertension Sister   . Hyperlipidemia Sister     ALLERGIES:  has No Known Allergies.  MEDICATIONS:  Scheduled Meds: . calcium carbonate  1 tablet Oral Daily  . heparin  5,000 Units Subcutaneous Q8H  . hydrocortisone  10 mg Oral Q lunch  . hydrocortisone  20 mg Oral Q breakfast  . multivitamin with minerals  1 tablet Oral Daily  . potassium chloride SA  20 mEq Oral Daily  . senna-docusate  2 tablet Oral BID  . sodium chloride flush  3 mL Intravenous Q12H  . sodium chloride  2 g Oral TID WC   Continuous Infusions: . sodium chloride    . sodium chloride 150 mL/hr at 05/03/18 1729   PRN Meds:.sodium chloride, acetaminophen **OR** acetaminophen, albuterol, LORazepam, magic mouthwash w/lidocaine, metoCLOPramide, [DISCONTINUED] ondansetron **OR** ondansetron (ZOFRAN) IV, ondansetron, oxyCODONE, polyethylene glycol, prochlorperazine, sodium chloride flush, sodium chloride flush, traMADol  REVIEW OF SYSTEMS:    10 Point review of Systems was done is negative except as noted  above.   LABORATORY DATA:  I have reviewed the data as listed  . CBC Latest Ref Rng & Units 05/02/2018 05/01/2018 04/30/2018  WBC 4.0 - 10.5 K/uL 6.5 7.4 6.6  Hemoglobin 12.0 - 15.0 g/dL 10.2(L) 11.1(L) 11.8  Hematocrit 36.0 - 46.0 % 29.0(L) 32.8(L) 35.2  Platelets 150 - 400 K/uL 354 374 415(H)   . CMP Latest Ref Rng & Units 05/03/2018 05/03/2018 05/02/2018  Glucose 70 - 99 mg/dL 166(H) 75 119(H)  BUN 8 - 23 mg/dL <5(L) <5(L) <5(L)  Creatinine 0.44 - 1.00 mg/dL 0.38(L) 0.31(L) 0.34(L)  Sodium 135 - 145 mmol/L 122(L) 124(L) 121(L)  Potassium 3.5 - 5.1 mmol/L 4.5 2.8(L) 4.1  Chloride 98 - 111 mmol/L 92(L)  91(L) 90(L)  CO2 22 - 32 mmol/L 22 23 22   Calcium 8.9 - 10.3 mg/dL 8.2(L) 7.5(L) 7.8(L)  Total Protein 6.5 - 8.1 g/dL - - -  Total Bilirubin 0.3 - 1.2 mg/dL - - -  Alkaline Phos 38 - 126 U/L - - -  AST 15 - 41 U/L - - -  ALT 0 - 44 U/L - - -     RADIOGRAPHIC STUDIES: I have personally reviewed the radiological images as listed and agreed with the findings in the report.  .Ct Head Wo Contrast  Result Date: 04/12/2018 CLINICAL DATA:  History of liver cancer and small cell lung cancer. Last chemotherapy 2 weeks ago. Abdominal pain and altered mental status. EXAM: CT HEAD WITHOUT CONTRAST TECHNIQUE: Contiguous axial images were obtained from the base of the skull through the vertex without intravenous contrast. COMPARISON:  02/24/2018 FINDINGS: Brain: No mass effect or midline shift. No abnormal extra-axial fluid collections. No ventricular dilatation. There is vague low-attenuation change in the left anterior frontal white matter. This is similar to previous study and likely represents an area of small vessel ischemic change. If there is high clinical suspicion for intracranial metastasis, then contrast-enhanced MR would be the more sensitive examination. Gray-white matter junctions are distinct. Basal cisterns are not effaced. No acute intracranial hemorrhage. Vascular: Mild intracranial  arterial calcifications are present. Skull: Calvarium appears intact. No acute depressed skull fractures or destructive bone lesions identified. Sinuses/Orbits: There is an air-fluid level in the left sphenoid sinus, similar previous study. This is likely inflammatory. Paranasal sinuses and mastoid air cells are otherwise clear. Other: None. IMPRESSION: No acute intracranial abnormalities. Focal low-attenuation change in the left anterior frontal white matter is similar to previous study and likely represents an area of small vessel ischemic change. If there is high clinical suspicion for intracranial metastasis, then contrast-enhanced MR would be the more sensitive examination. Electronically Signed   By: Lucienne Capers M.D.   On: 04/12/2018 23:24   Ct Chest W Contrast  Result Date: 05/02/2018 CLINICAL DATA:  Extensive stage small cell right lung cancer. Failure to thrive. Hyponatremia. Restaging. Inpatient. EXAM: CT CHEST, ABDOMEN, AND PELVIS WITH CONTRAST TECHNIQUE: Multidetector CT imaging of the chest, abdomen and pelvis was performed following the standard protocol during bolus administration of intravenous contrast. CONTRAST:  152mL OMNIPAQUE IOHEXOL 300 MG/ML SOLN, 68mL ISOVUE-300 IOPAMIDOL (ISOVUE-300) INJECTION 61% COMPARISON:  04/12/2018 CT abdomen/pelvis and chest radiograph. 02/25/2018 MRI abdomen. 12/26/2017 PET-CT. 12/19/2017 chest CT. FINDINGS: CT CHEST FINDINGS Cardiovascular: Normal heart size. Trace pericardial effusion/thickening, slightly increased. Three-vessel coronary atherosclerosis. Left PICC terminates in the upper third of the SVC. Atherosclerotic nonaneurysmal thoracic aorta. Stable top-normal main pulmonary artery (3.1 cm diameter). No central pulmonary emboli. Mediastinum/Nodes: No discrete thyroid nodules. Unremarkable esophagus. No axillary adenopathy. No left hilar adenopathy. No new mediastinal adenopathy. Lungs/Pleura: No pneumothorax. Stable trace dependent right pleural  effusion. No left pleural effusion. Severe centrilobular emphysema with diffuse bronchial wall thickening. Infiltrative poorly marginated right infrahilar 4.0 x 2.7 cm lung mass (series 2/image 35), contiguous with right hilar and subcarinal adenopathy, mildly increased from 3.7 x 2.1 cm on 04/12/2018 CT abdomen study. Stable complete right lower lobe and near complete right middle lobe atelectasis. No new significant pulmonary nodules. Musculoskeletal: Widespread patchy confluent sclerotic osseous metastases throughout the thoracic spine, sternum, bilateral ribs and bilateral shoulder girdles, significantly increased in extent and sclerosis since 12/19/2017 chest CT, not appreciably changed in the lower thoracic spine since 04/12/2018 CT abdomen. Mild thoracic spondylosis. CT  ABDOMEN PELVIS FINDINGS Hepatobiliary: Numerous (at least 10) hypoenhancing liver masses scattered throughout the liver, increased in size and number since 04/12/2018 CT. Representative posterior right liver lobe 3.4 cm mass (series 2/image 58), increased from 2.3 cm. Representative superior right liver lobe 2.5 cm mass (series 2/image 48), increased from 1.9 cm. Representative inferior left liver lobe 2.7 cm mass (series 2/image 65), increased from 1.6 cm. Well-positioned CBD stent with the lower tip abutting the duodenal lumen at the ampulla. Decreased intrahepatic biliary ductal dilatation with significantly increased pneumobilia. Pancreas: No discrete pancreatic mass. Stable top-normal caliber main pancreatic duct. Spleen: Normal size. No mass. Adrenals/Urinary Tract: Left adrenal 2.0 cm nodule (series 2/image 61), increased from 1.5 cm. No discrete right adrenal nodule. Normal kidneys with no hydronephrosis and no renal mass. Normal bladder. Stomach/Bowel: Normal non-distended stomach. Normal caliber small bowel with no small bowel wall thickening. Normal appendix. Moderate colonic stool. No large bowel wall thickening or acute  pericolonic fat stranding. Vascular/Lymphatic: Atherosclerotic nonaneurysmal abdominal aorta. Patent portal, splenic, hepatic and renal veins. Enlarged 1.5 cm porta hepatis node (series 2/image 62), increased from 1.1 cm. No additional pathologically enlarged lymph nodes in the abdomen or pelvis. Reproductive: Grossly normal uterus.  No adnexal mass. Other: No pneumoperitoneum. No focal fluid collection. Trace pelvic ascites. Stable subcutaneous 1.6 cm cystic lesion in the ventral right pelvic wall (series 2/image 111), probably a sebaceous cyst. Musculoskeletal: Widespread patchy confluent sclerotic osseous metastases throughout the lumbar spine and bilateral pelvic girdle, not appreciably changed since 04/12/2018 CT. Stable pathologic L2 vertebral compression fracture with stable 5 mm retropulsion of L2 posterior vertebral osseous fragments. IMPRESSION: 1. Numerous liver metastases, increased in size and number since 04/12/2018 CT. 2. 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 3. Porta hepatis adenopathy is mildly increased and suspicious for nodal metastasis. 4. Right infrahilar infiltrative lung mass is mildly increased in size. 5. Widespread patchy confluent sclerotic osseous metastatic disease throughout the axial and proximal appendicular skeleton, not appreciably changed since 04/12/2018 CT abdomen/pelvis study, clearly progressed since 12/19/2017 chest CT. Stable L2 vertebral pathologic fracture. 6. Decreased intrahepatic biliary ductal dilatation with improved pneumobilia status post CBD stent revision. 7. Trace pericardial effusion, slightly increased. Trace right pleural effusion, stable. Trace pelvic ascites. 8. Aortic Atherosclerosis (ICD10-I70.0) and Emphysema (ICD10-J43.9). Electronically Signed   By: Ilona Sorrel M.D.   On: 05/02/2018 20:50   Mr Jeri Cos GL Contrast  Result Date: 05/02/2018 CLINICAL DATA:  Small cell lung cancer. Status post whole-brain radiation. Headaches. New onset of  weakness and dizziness. EXAM: MRI HEAD WITHOUT AND WITH CONTRAST TECHNIQUE: Multiplanar, multiecho pulse sequences of the brain and surrounding structures were obtained without and with intravenous contrast. CONTRAST:  5 mL Gadavist COMPARISON:  MRI brain 01/12/2018. CT head without contrast 02/24/2018 and 04/12/2018. FINDINGS: Brain: Majority of lesions have decreased in size. There 2 new or enlarging lesions, each measuring 3 mm. A 3 mm lesion is present in the medial right temporal lobe. This may be artifact from adjacent vessel. A 3 mm cortical lesion is present in the right frontal lobe on image 43 of series 11. Lesion adjacent to the right lateral ventricle now measures 5 mm, stable to slightly decreased. A lesion along the splenium of the corpus callosum is decreased in size, now measuring 4 mm. No acute infarct or hemorrhage is present. At least 3 of the lesions demonstrate restricted diffusion, compatible with small cell lung cancer. Mild periventricular white matter changes have increased since the prior study, compatible with  whole-brain radiation Vascular: Flow is present in the major intracranial arteries. Skull and upper cervical spine: The craniocervical junction is normal. Progressive sclerotic lesion is present at C2 without collapse. There is diffuse metastatic infiltration of the calvarium. Sinuses/Orbits: The paranasal sinuses are clear. There is fluid in the mastoid air cells bilaterally. No obstructing nasopharyngeal lesion is present. Globes and orbits are within normal limits. IMPRESSION: 1. Positive response to radiation therapy with interval decrease in size of most lesions. 2. New or increased size of 3 mm cortical lesion in the right frontal lobe on image 43 of series 11. 3. New 3 mm lesion along the medial right temporal lobe. This may be artifact. Please see the discussion above. 4. Increasing white matter disease likely reflects the sequela of chronic microvascular ischemia. 5. New  osseous metastasis at C2 without pathologic fracture. 6. Diffuse osseous metastases throughout the calvarium. Electronically Signed   By: San Morelle M.D.   On: 05/02/2018 16:36   Ct Abdomen Pelvis W Contrast  Result Date: 05/02/2018 CLINICAL DATA:  Extensive stage small cell right lung cancer. Failure to thrive. Hyponatremia. Restaging. Inpatient. EXAM: CT CHEST, ABDOMEN, AND PELVIS WITH CONTRAST TECHNIQUE: Multidetector CT imaging of the chest, abdomen and pelvis was performed following the standard protocol during bolus administration of intravenous contrast. CONTRAST:  163mL OMNIPAQUE IOHEXOL 300 MG/ML SOLN, 38mL ISOVUE-300 IOPAMIDOL (ISOVUE-300) INJECTION 61% COMPARISON:  04/12/2018 CT abdomen/pelvis and chest radiograph. 02/25/2018 MRI abdomen. 12/26/2017 PET-CT. 12/19/2017 chest CT. FINDINGS: CT CHEST FINDINGS Cardiovascular: Normal heart size. Trace pericardial effusion/thickening, slightly increased. Three-vessel coronary atherosclerosis. Left PICC terminates in the upper third of the SVC. Atherosclerotic nonaneurysmal thoracic aorta. Stable top-normal main pulmonary artery (3.1 cm diameter). No central pulmonary emboli. Mediastinum/Nodes: No discrete thyroid nodules. Unremarkable esophagus. No axillary adenopathy. No left hilar adenopathy. No new mediastinal adenopathy. Lungs/Pleura: No pneumothorax. Stable trace dependent right pleural effusion. No left pleural effusion. Severe centrilobular emphysema with diffuse bronchial wall thickening. Infiltrative poorly marginated right infrahilar 4.0 x 2.7 cm lung mass (series 2/image 35), contiguous with right hilar and subcarinal adenopathy, mildly increased from 3.7 x 2.1 cm on 04/12/2018 CT abdomen study. Stable complete right lower lobe and near complete right middle lobe atelectasis. No new significant pulmonary nodules. Musculoskeletal: Widespread patchy confluent sclerotic osseous metastases throughout the thoracic spine, sternum, bilateral  ribs and bilateral shoulder girdles, significantly increased in extent and sclerosis since 12/19/2017 chest CT, not appreciably changed in the lower thoracic spine since 04/12/2018 CT abdomen. Mild thoracic spondylosis. CT ABDOMEN PELVIS FINDINGS Hepatobiliary: Numerous (at least 10) hypoenhancing liver masses scattered throughout the liver, increased in size and number since 04/12/2018 CT. Representative posterior right liver lobe 3.4 cm mass (series 2/image 58), increased from 2.3 cm. Representative superior right liver lobe 2.5 cm mass (series 2/image 48), increased from 1.9 cm. Representative inferior left liver lobe 2.7 cm mass (series 2/image 65), increased from 1.6 cm. Well-positioned CBD stent with the lower tip abutting the duodenal lumen at the ampulla. Decreased intrahepatic biliary ductal dilatation with significantly increased pneumobilia. Pancreas: No discrete pancreatic mass. Stable top-normal caliber main pancreatic duct. Spleen: Normal size. No mass. Adrenals/Urinary Tract: Left adrenal 2.0 cm nodule (series 2/image 61), increased from 1.5 cm. No discrete right adrenal nodule. Normal kidneys with no hydronephrosis and no renal mass. Normal bladder. Stomach/Bowel: Normal non-distended stomach. Normal caliber small bowel with no small bowel wall thickening. Normal appendix. Moderate colonic stool. No large bowel wall thickening or acute pericolonic fat stranding. Vascular/Lymphatic: Atherosclerotic nonaneurysmal abdominal aorta.  Patent portal, splenic, hepatic and renal veins. Enlarged 1.5 cm porta hepatis node (series 2/image 62), increased from 1.1 cm. No additional pathologically enlarged lymph nodes in the abdomen or pelvis. Reproductive: Grossly normal uterus.  No adnexal mass. Other: No pneumoperitoneum. No focal fluid collection. Trace pelvic ascites. Stable subcutaneous 1.6 cm cystic lesion in the ventral right pelvic wall (series 2/image 111), probably a sebaceous cyst. Musculoskeletal:  Widespread patchy confluent sclerotic osseous metastases throughout the lumbar spine and bilateral pelvic girdle, not appreciably changed since 04/12/2018 CT. Stable pathologic L2 vertebral compression fracture with stable 5 mm retropulsion of L2 posterior vertebral osseous fragments. IMPRESSION: 1. Numerous liver metastases, increased in size and number since 04/12/2018 CT. 2. 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 3. Porta hepatis adenopathy is mildly increased and suspicious for nodal metastasis. 4. Right infrahilar infiltrative lung mass is mildly increased in size. 5. Widespread patchy confluent sclerotic osseous metastatic disease throughout the axial and proximal appendicular skeleton, not appreciably changed since 04/12/2018 CT abdomen/pelvis study, clearly progressed since 12/19/2017 chest CT. Stable L2 vertebral pathologic fracture. 6. Decreased intrahepatic biliary ductal dilatation with improved pneumobilia status post CBD stent revision. 7. Trace pericardial effusion, slightly increased. Trace right pleural effusion, stable. Trace pelvic ascites. 8. Aortic Atherosclerosis (ICD10-I70.0) and Emphysema (ICD10-J43.9). Electronically Signed   By: Ilona Sorrel M.D.   On: 05/02/2018 20:50   Ct Abdomen Pelvis W Contrast  Result Date: 04/12/2018 CLINICAL DATA:  Liver cancer abdominal pain EXAM: CT ABDOMEN AND PELVIS WITH CONTRAST TECHNIQUE: Multidetector CT imaging of the abdomen and pelvis was performed using the standard protocol following bolus administration of intravenous contrast. CONTRAST:  63mL ISOVUE-300 IOPAMIDOL (ISOVUE-300) INJECTION 61% COMPARISON:  MRI 02/25/2018, PET-CT 12/26/2017, CT 05/23/2017 FINDINGS: Lower chest: Lung bases demonstrate tiny pleural effusions. Passive atelectasis in the posterior left lung base. Persistent collapse in the right lower lobe with occlusion of the bronchus as before. Heart size within normal limits. Coronary vascular calcification. Hepatobiliary:  Increased intra hepatic biliary dilatation since prior study. Increased size of hypodense metastatic liver lesions. Index posterior right hepatic lobe lesion now measures 23 mm compared with 10 mm previously. A left lobe lesion measures 16 mm, compared with 9 mm previously. Decreased pneumobilia with only minimal foci of pneumobilia evident. No air visible within the biliary stent except at its duodenal termination. Minimal wall thickening of the gallbladder. No calcified stones. Pancreas: No inflammatory changes.  No measurable mass. Spleen: Normal in size without focal abnormality. Adrenals/Urinary Tract: Right adrenal gland is within normal limits. Subtle increased nodularity of the left adrenal gland no hydronephrosis. The bladder is distended Stomach/Bowel: The stomach is nonenlarged. No dilated small bowel. No colon wall thickening. Vascular/Lymphatic: Moderate to marked aortic atherosclerosis. No aneurysmal dilatation. No definitive adenopathy. Reproductive: Uterus and bilateral adnexa are unremarkable. Other: Negative for free air. Tiny amount of free fluid in the pelvis. Musculoskeletal: Extensive skeletal metastatic disease. Vertically oriented fracture through L2 with retropulsion and moderate canal stenosis as before. IMPRESSION: 1. Biliary stent in place. Overall decreased pneumobilia since prior CT with minimal foci of gas present. Interval increase in intra hepatic biliary dilatation; constellation of findings is concerning for stent occlusion. 2. Increased size of hepatic metastatic lesions consistent with disease progression. 3. Right infrahilar soft tissue density/presumed mass and collapse of the right lower lobe as before. Trace pleural effusion 4. Subtle increased nodularity of the left adrenal gland is also concerning for metastatic disease. 5. Widespread skeletal metastatic disease with pathologic L2 fracture, retropulsion, and canal  stenosis as noted on the prior exams. Electronically Signed    By: Donavan Foil M.D.   On: 04/12/2018 23:40   Dg Chest Port 1 View  Result Date: 04/12/2018 CLINICAL DATA:  Fever and cough EXAM: PORTABLE CHEST 1 VIEW COMPARISON:  03/01/2018, 12/19/2017 FINDINGS: Right-sided central venous port tip overlies the cavoatrial region. No acute airspace disease or effusion. Stable cardiomediastinal silhouette. No pneumothorax. Minimal atelectasis or scar at the left base. IMPRESSION: No active disease. Electronically Signed   By: Donavan Foil M.D.   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 Imaging suggestion of potential relative obstruction of the bile duct stent. EXAM: ERCP TECHNIQUE: Multiple spot images obtained with the fluoroscopic device and submitted for interpretation post-procedure. COMPARISON:  CT of the abdomen on 04/12/2018 FINDINGS: C-arm imaging during the endoscopic procedure demonstrates balloon dilatation of the indwelling metallic common bile duct stent. During the procedure there appears to be placement of an overlapping metallic stent. Occlusion cholangiogram shows opacification of the biliary tree proximal to the stented segment. IMPRESSION: Imaging obtained during balloon dilatation of an indwelling metallic common bile duct stent and placement of new overlapping metallic biliary stent. These images were submitted for radiologic interpretation only. Please see the procedural report for the amount of contrast and the fluoroscopy time utilized. Electronically Signed   By: Aletta Edouard M.D.   On: 04/15/2018 07:56   [place] Ekg Site Rite  Result Date: 04/17/2018 If Site Rite image not attached, placement could not be confirmed due to current cardiac rhythm.   ASSESSMENT & PLAN:   67 y.o. female with  1. 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  Liver mets + Bone mets + adrenal mets Pancreatic mets- FNA confirmed MRI brain 10/29/2017 after this clinic visit showed concern for multiple asymptomatic subcentimeter brain mets. 11/28/17 Brain MRI which revealed stable lesions, and new nonenhancing lesions. I discussed this imaging study with Dr Ulis Rias our neuro-oncologist -- no area of critical brain mets that needs patient to rush into WBRT at this time.  12/26/17 PET which revealed Marked improvement in the previously demonstrated widespread hypermetabolic metastatic disease. There is only mild residual hypermetabolic activity within some of the osseous lesions.   patient has completed 4 cycles of carboplatin/etoposide/Atezolizumab and completed -completed WBRT 30 Gy in 10 fractions 01/15/2018 - 01/29/2018   CT abd 8/18-- concern for progression of mets in liver.  2. 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 Readmitted again this hospitalization 3. Hypokalemia.  Has been a recurrent issue due to decreased p.o. Intake. PLAN -hyponatremia mx per hospitalist -IVF and stress dose steroids -Started on sodium chloride tablets one 1 g 3 times daily with meals -consider nephrology consult given complex etiologies for recurrent hyponatremia -Nutritional therapy consultation for optimizing p.o. Intake. -MRI brain -- most brain mets resolved. Couple of area uncertain for new changes. -CT chest/abd/pelvis with signs of SCLC progression in liver, abd LN, left adrenal, lung and new C2 lesion. -discussed concerns for progression in details with the patient and her son at bedside. -discussed BSC vs 2nd line immunotherapy. -if the patient can stabilize her metabolic issues/hyponatremia and nutritional and functional status will plan to  condider outpatient  Pembrolizumab.   3.s/p previous obstructive Jaundice due to biliary compression from pancreatic mass  -ERCP attempted by Dr Watt Climes - not successful -ERCP completed on 10/24/17 with Dr. Harley Hallmark with much improved LFTs -Bx of pancreatic lesion -consistent with small cell lung cancer. Now with abnormal LFTs and concerns with stent blockages and possible biliary infection. Rpt ERCP and restenting 04/15/2018 - concern for A single localized biliary stricture was found in the lower third of the main bile duct. The stricture was malignant appearing secondary to tumor ingrowth. - The lower third of the main bile duct was dilated One partially covered metal stent was placed into the common bile duct. -improving LFTs post re stenting  We will continue to follow  I spent 25 minutes counseling the patient face to face. The total time spent in the appointment was 35 minutes and more than 50% was on counseling and direct patient cares.    Sullivan Lone MD [place] [facility] Hematology/Oncology Physician [facility]  (Office):       [phone] (Work cell):  [phone] (Fax):           [phone]

## 2018-05-03 NOTE — Progress Notes (Signed)
PROGRESS NOTE [facility]   CHARLY HUNTON   JJK:[id number] DOB: Aug 18, 1951  DOA: 05/01/2018 PCP: Alycia Rossetti, MD   Brief Narrative:  Haley [name] is a 67 y/o female with medical hx extensive metastatic small cell carcinoma of the lung.  Patient was sent from oncology office due to persistent hyponatremia.  Patient was seen on 9/5 due to persistent weakness and dizziness, found to have sodium of 117 was given IV fluid and return it on 9/6 for further evaluation.  Labs show sodium of 116 therefore she was sent to the emergency department for further evaluation and management of hyponatremia.  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  Subjective: No complaints, sodium slowly improving. Denies chest pain, SOB, Leg swelling and palpitations.   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 Urine Na+ 68. Check BMP every 12 hours avoid correction above 10 mEq in 24 hrs. Increase NS to 150 cc/hr. Salt tablets increased to 2G daily. Will check cortisol in AM. No neurological symptoms at this time.  TSH normal.  Continue to monitor closely.   Small cell carcinoma of the lung Extended metastases.  Patient completed chemotherapy and radiation. Continue management per oncology   Hypokalemia  Improved, check BMP in AM   DVT prophylaxis: Heparin sq  Code Status: Full Code  Family Communication: None at bedside  Disposition Plan: Home when Na+ improve   Consultants:   None   Procedures:   None  Antimicrobials:  None     Objective: Vitals:   05/02/18 1230 05/02/18 2050 05/03/18 0523 05/03/18 1323  BP: 105/75 138/80 118/63 129/79  Pulse: 69 65 78 71  Resp:  18 16 20   Temp: 98 F (36.7 C) 98.2 F (36.8 C) 99.1 F (37.3 C) 99.1 F (37.3 C)  TempSrc: Oral Oral Oral Oral  SpO2: 97% 98% 93% 98%  Weight:      Height:        Intake/Output Summary (Last 24 hours) at  05/03/2018 1641 Last data filed at 05/03/2018 1535 Gross per 24 hour  Intake 4489.38 ml  Output 5575 ml  Net -1085.62 ml   Filed Weights   05/01/18 1237  Weight: 54.3 kg    Examination:  General: Pt is alert, awake, not in acute distress Cardiovascular: RRR, S1/S2 +, no rubs, no gallops Respiratory: CTA bilaterally, no wheezing, no rhonchi Abdominal: Soft, NT, ND, bowel sounds + Extremities: no edema, no cyanosis  Data Reviewed: I have personally reviewed following labs and imaging studies  CBC: Recent Labs  Lab 04/30/18 0936 05/01/18 0949 05/02/18 0404  WBC 6.6 7.4 6.5  NEUTROABS 4.7 6.2  --   HGB 11.8 11.1* 10.2*  HCT 35.2 32.8* 29.0*  MCV 84.5 83.9 81.5  PLT 415* 374 371   Basic Metabolic Panel: Recent Labs  Lab 05/01/18 0949 05/01/18 1736 05/02/18 0404 05/02/18 1544 05/03/18 0343 05/03/18 1557  NA 116* 119* 120* 121* 124* 122*  K 4.1 3.8 2.8* 4.1 2.8* PENDING  CL 84* 86* 86* 90* 91* 92*  CO2 23 25 23 22 23 22   GLUCOSE 90 118* 75 119* 75 166*  BUN 6* 6* <5* <5* <5* <5*  CREATININE 0.55 <0.30* <0.30* 0.34* 0.31* 0.38*  CALCIUM 8.8* 8.2* 7.5* 7.8* 7.5* 8.2*  MG 1.6*  --  1.9  --  1.8  --   PHOS 3.4  --   --   --   --   --  GFR: Estimated Creatinine Clearance: 58 mL/min (A) (by C-G formula based on SCr of 0.38 mg/dL (L)). Liver Function Tests: Recent Labs  Lab 04/30/18 0936  AST 23  ALT 24  ALKPHOS 187*  BILITOT 0.9  PROT 6.9  ALBUMIN 3.3*   No results for input(s): LIPASE, AMYLASE in the last 168 hours. No results for input(s): AMMONIA in the last 168 hours. Coagulation Profile: No results for input(s): INR, PROTIME in the last 168 hours. Cardiac Enzymes: No results for input(s): CKTOTAL, CKMB, CKMBINDEX, TROPONINI in the last 168 hours. BNP (last 3 results) No results for input(s): PROBNP in the last 8760 hours. HbA1C: No results for input(s): HGBA1C in the last 72 hours. CBG: No results for input(s): GLUCAP in the last 168 hours. Lipid  Profile: No results for input(s): CHOL, HDL, LDLCALC, TRIG, CHOLHDL, LDLDIRECT in the last 72 hours. Thyroid Function Tests: Recent Labs    05/01/18 0949  TSH 1.115   Anemia Panel: No results for input(s): VITAMINB12, FOLATE, FERRITIN, TIBC, IRON, RETICCTPCT in the last 72 hours. Sepsis Labs: No results for input(s): PROCALCITON, LATICACIDVEN in the last 168 hours.  No results found for this or any previous visit (from the past 240 hour(s)).   Radiology Studies: Ct Chest W Contrast  Result Date: 05/02/2018 CLINICAL DATA:  Extensive stage small cell right lung cancer. Failure to thrive. Hyponatremia. Restaging. Inpatient. EXAM: CT CHEST, ABDOMEN, AND PELVIS WITH CONTRAST TECHNIQUE: Multidetector CT imaging of the chest, abdomen and pelvis was performed following the standard protocol during bolus administration of intravenous contrast. CONTRAST:  185mL OMNIPAQUE IOHEXOL 300 MG/ML SOLN, 24mL ISOVUE-300 IOPAMIDOL (ISOVUE-300) INJECTION 61% COMPARISON:  04/12/2018 CT abdomen/pelvis and chest radiograph. 02/25/2018 MRI abdomen. 12/26/2017 PET-CT. 12/19/2017 chest CT. FINDINGS: CT CHEST FINDINGS Cardiovascular: Normal heart size. Trace pericardial effusion/thickening, slightly increased. Three-vessel coronary atherosclerosis. Left PICC terminates in the upper third of the SVC. Atherosclerotic nonaneurysmal thoracic aorta. Stable top-normal main pulmonary artery (3.1 cm diameter). No central pulmonary emboli. Mediastinum/Nodes: No discrete thyroid nodules. Unremarkable esophagus. No axillary adenopathy. No left hilar adenopathy. No new mediastinal adenopathy. Lungs/Pleura: No pneumothorax. Stable trace dependent right pleural effusion. No left pleural effusion. Severe centrilobular emphysema with diffuse bronchial wall thickening. Infiltrative poorly marginated right infrahilar 4.0 x 2.7 cm lung mass (series 2/image 35), contiguous with right hilar and subcarinal adenopathy, mildly increased from 3.7 x 2.1  cm on 04/12/2018 CT abdomen study. Stable complete right lower lobe and near complete right middle lobe atelectasis. No new significant pulmonary nodules. Musculoskeletal: Widespread patchy confluent sclerotic osseous metastases throughout the thoracic spine, sternum, bilateral ribs and bilateral shoulder girdles, significantly increased in extent and sclerosis since 12/19/2017 chest CT, not appreciably changed in the lower thoracic spine since 04/12/2018 CT abdomen. Mild thoracic spondylosis. CT ABDOMEN PELVIS FINDINGS Hepatobiliary: Numerous (at least 10) hypoenhancing liver masses scattered throughout the liver, increased in size and number since 04/12/2018 CT. Representative posterior right liver lobe 3.4 cm mass (series 2/image 58), increased from 2.3 cm. Representative superior right liver lobe 2.5 cm mass (series 2/image 48), increased from 1.9 cm. Representative inferior left liver lobe 2.7 cm mass (series 2/image 65), increased from 1.6 cm. Well-positioned CBD stent with the lower tip abutting the duodenal lumen at the ampulla. Decreased intrahepatic biliary ductal dilatation with significantly increased pneumobilia. Pancreas: No discrete pancreatic mass. Stable top-normal caliber main pancreatic duct. Spleen: Normal size. No mass. Adrenals/Urinary Tract: Left adrenal 2.0 cm nodule (series 2/image 61), increased from 1.5 cm. No discrete right adrenal nodule. Normal  kidneys with no hydronephrosis and no renal mass. Normal bladder. Stomach/Bowel: Normal non-distended stomach. Normal caliber small bowel with no small bowel wall thickening. Normal appendix. Moderate colonic stool. No large bowel wall thickening or acute pericolonic fat stranding. Vascular/Lymphatic: Atherosclerotic nonaneurysmal abdominal aorta. Patent portal, splenic, hepatic and renal veins. Enlarged 1.5 cm porta hepatis node (series 2/image 62), increased from 1.1 cm. No additional pathologically enlarged lymph nodes in the abdomen or  pelvis. Reproductive: Grossly normal uterus.  No adnexal mass. Other: No pneumoperitoneum. No focal fluid collection. Trace pelvic ascites. Stable subcutaneous 1.6 cm cystic lesion in the ventral right pelvic wall (series 2/image 111), probably a sebaceous cyst. Musculoskeletal: Widespread patchy confluent sclerotic osseous metastases throughout the lumbar spine and bilateral pelvic girdle, not appreciably changed since 04/12/2018 CT. Stable pathologic L2 vertebral compression fracture with stable 5 mm retropulsion of L2 posterior vertebral osseous fragments. IMPRESSION: 1. Numerous liver metastases, increased in size and number since 04/12/2018 CT. 2. 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 3. Porta hepatis adenopathy is mildly increased and suspicious for nodal metastasis. 4. Right infrahilar infiltrative lung mass is mildly increased in size. 5. Widespread patchy confluent sclerotic osseous metastatic disease throughout the axial and proximal appendicular skeleton, not appreciably changed since 04/12/2018 CT abdomen/pelvis study, clearly progressed since 12/19/2017 chest CT. Stable L2 vertebral pathologic fracture. 6. Decreased intrahepatic biliary ductal dilatation with improved pneumobilia status post CBD stent revision. 7. Trace pericardial effusion, slightly increased. Trace right pleural effusion, stable. Trace pelvic ascites. 8. Aortic Atherosclerosis (ICD10-I70.0) and Emphysema (ICD10-J43.9). Electronically Signed   By: Ilona Sorrel M.D.   On: 05/02/2018 20:50   Mr Jeri Cos IZ Contrast  Result Date: 05/02/2018 CLINICAL DATA:  Small cell lung cancer. Status post whole-brain radiation. Headaches. New onset of weakness and dizziness. EXAM: MRI HEAD WITHOUT AND WITH CONTRAST TECHNIQUE: Multiplanar, multiecho pulse sequences of the brain and surrounding structures were obtained without and with intravenous contrast. CONTRAST:  5 mL Gadavist COMPARISON:  MRI brain 01/12/2018. CT head without contrast  02/24/2018 and 04/12/2018. FINDINGS: Brain: Majority of lesions have decreased in size. There 2 new or enlarging lesions, each measuring 3 mm. A 3 mm lesion is present in the medial right temporal lobe. This may be artifact from adjacent vessel. A 3 mm cortical lesion is present in the right frontal lobe on image 43 of series 11. Lesion adjacent to the right lateral ventricle now measures 5 mm, stable to slightly decreased. A lesion along the splenium of the corpus callosum is decreased in size, now measuring 4 mm. No acute infarct or hemorrhage is present. At least 3 of the lesions demonstrate restricted diffusion, compatible with small cell lung cancer. Mild periventricular white matter changes have increased since the prior study, compatible with whole-brain radiation Vascular: Flow is present in the major intracranial arteries. Skull and upper cervical spine: The craniocervical junction is normal. Progressive sclerotic lesion is present at C2 without collapse. There is diffuse metastatic infiltration of the calvarium. Sinuses/Orbits: The paranasal sinuses are clear. There is fluid in the mastoid air cells bilaterally. No obstructing nasopharyngeal lesion is present. Globes and orbits are within normal limits. IMPRESSION: 1. Positive response to radiation therapy with interval decrease in size of most lesions. 2. New or increased size of 3 mm cortical lesion in the right frontal lobe on image 43 of series 11. 3. New 3 mm lesion along the medial right temporal lobe. This may be artifact. Please see the discussion above. 4. Increasing white matter disease  likely reflects the sequela of chronic microvascular ischemia. 5. New osseous metastasis at C2 without pathologic fracture. 6. Diffuse osseous metastases throughout the calvarium. Electronically Signed   By: [place] M.D.   On: 05/02/2018 16:36   Ct Abdomen Pelvis W Contrast  Result Date: 05/02/2018 CLINICAL DATA:  Extensive stage small cell right  lung cancer. Failure to thrive. Hyponatremia. Restaging. Inpatient. EXAM: CT CHEST, ABDOMEN, AND PELVIS WITH CONTRAST TECHNIQUE: Multidetector CT imaging of the chest, abdomen and pelvis was performed following the standard protocol during bolus administration of intravenous contrast. CONTRAST:  154mL OMNIPAQUE IOHEXOL 300 MG/ML SOLN, 89mL ISOVUE-300 IOPAMIDOL (ISOVUE-300) INJECTION 61% COMPARISON:  04/12/2018 CT abdomen/pelvis and chest radiograph. 02/25/2018 MRI abdomen. 12/26/2017 PET-CT. 12/19/2017 chest CT. FINDINGS: CT CHEST FINDINGS Cardiovascular: Normal heart size. Trace pericardial effusion/thickening, slightly increased. Three-vessel coronary atherosclerosis. Left PICC terminates in the upper third of the SVC. Atherosclerotic nonaneurysmal thoracic aorta. Stable top-normal main pulmonary artery (3.1 cm diameter). No central pulmonary emboli. Mediastinum/Nodes: No discrete thyroid nodules. Unremarkable esophagus. No axillary adenopathy. No left hilar adenopathy. No new mediastinal adenopathy. Lungs/Pleura: No pneumothorax. Stable trace dependent right pleural effusion. No left pleural effusion. Severe centrilobular emphysema with diffuse bronchial wall thickening. Infiltrative poorly marginated right infrahilar 4.0 x 2.7 cm lung mass (series 2/image 35), contiguous with right hilar and subcarinal adenopathy, mildly increased from 3.7 x 2.1 cm on 04/12/2018 CT abdomen study. Stable complete right lower lobe and near complete right middle lobe atelectasis. No new significant pulmonary nodules. Musculoskeletal: Widespread patchy confluent sclerotic osseous metastases throughout the thoracic spine, sternum, bilateral ribs and bilateral shoulder girdles, significantly increased in extent and sclerosis since 12/19/2017 chest CT, not appreciably changed in the lower thoracic spine since 04/12/2018 CT abdomen. Mild thoracic spondylosis. CT ABDOMEN PELVIS FINDINGS Hepatobiliary: Numerous (at least 10) hypoenhancing  liver masses scattered throughout the liver, increased in size and number since 04/12/2018 CT. Representative posterior right liver lobe 3.4 cm mass (series 2/image 58), increased from 2.3 cm. Representative superior right liver lobe 2.5 cm mass (series 2/image 48), increased from 1.9 cm. Representative inferior left liver lobe 2.7 cm mass (series 2/image 65), increased from 1.6 cm. Well-positioned CBD stent with the lower tip abutting the duodenal lumen at the ampulla. Decreased intrahepatic biliary ductal dilatation with significantly increased pneumobilia. Pancreas: No discrete pancreatic mass. Stable top-normal caliber main pancreatic duct. Spleen: Normal size. No mass. Adrenals/Urinary Tract: Left adrenal 2.0 cm nodule (series 2/image 61), increased from 1.5 cm. No discrete right adrenal nodule. Normal kidneys with no hydronephrosis and no renal mass. Normal bladder. Stomach/Bowel: Normal non-distended stomach. Normal caliber small bowel with no small bowel wall thickening. Normal appendix. Moderate colonic stool. No large bowel wall thickening or acute pericolonic fat stranding. Vascular/Lymphatic: Atherosclerotic nonaneurysmal abdominal aorta. Patent portal, splenic, hepatic and renal veins. Enlarged 1.5 cm porta hepatis node (series 2/image 62), increased from 1.1 cm. No additional pathologically enlarged lymph nodes in the abdomen or pelvis. Reproductive: Grossly normal uterus.  No adnexal mass. Other: No pneumoperitoneum. No focal fluid collection. Trace pelvic ascites. Stable subcutaneous 1.6 cm cystic lesion in the ventral right pelvic wall (series 2/image 111), probably a sebaceous cyst. Musculoskeletal: Widespread patchy confluent sclerotic osseous metastases throughout the lumbar spine and bilateral pelvic girdle, not appreciably changed since 04/12/2018 CT. Stable pathologic L2 vertebral compression fracture with stable 5 mm retropulsion of L2 posterior vertebral osseous fragments. IMPRESSION: 1.  Numerous liver metastases, increased in size and number since 04/12/2018 CT. 2. Left adrenal metastasis is increased in  size. 3. Porta hepatis adenopathy is mildly increased and suspicious for nodal metastasis. 4. Right infrahilar infiltrative lung mass is mildly increased in size. 5. Widespread patchy confluent sclerotic osseous metastatic disease throughout the axial and proximal appendicular skeleton, not appreciably changed since 04/12/2018 CT abdomen/pelvis study, clearly progressed since 12/19/2017 chest CT. Stable L2 vertebral pathologic fracture. 6. Decreased intrahepatic biliary ductal dilatation with improved pneumobilia status post CBD stent revision. 7. Trace pericardial effusion, slightly increased. Trace right pleural effusion, stable. Trace pelvic ascites. 8. Aortic Atherosclerosis (ICD10-I70.0) and Emphysema (ICD10-J43.9). Electronically Signed   By: Ilona Sorrel M.D.   On: 05/02/2018 20:50     Scheduled Meds: . calcium carbonate  1 tablet Oral Daily  . heparin  5,000 Units Subcutaneous Q8H  . hydrocortisone  10 mg Oral Q lunch  . hydrocortisone  20 mg Oral Q breakfast  . multivitamin with minerals  1 tablet Oral Daily  . potassium chloride SA  20 mEq Oral Daily  . senna-docusate  2 tablet Oral BID  . sodium chloride flush  3 mL Intravenous Q12H  . sodium chloride  1 g Oral TID WC   Continuous Infusions: . sodium chloride    . sodium chloride 150 mL/hr at 05/03/18 1003     LOS: 2 days   Time spent: Total of 25 minutes spent with pt, greater than 50% of which was spent in discussion of  treatment, counseling and coordination of care   Chipper Oman, MD Pager: Text Page via [email or web address]   If 7PM-7AM, please contact night-coverage [email or web address] 05/03/2018, 4:41 PM   Note - This record has been created using [employer]. Chart creation errors have been sought, but may not always have been located. Such creation errors do not reflect on the standard of medical care.

## 2018-05-03 NOTE — Progress Notes (Signed)
Mar[facility] Kitchen   HEMATOLOGY/ONCOLOGY INPATIENT PROGRESS NOTE  Date of Service: 05/03/2018  Inpatient Attending: .Doreatha Lew, MD   SUBJECTIVE  Patient well-known from the oncology clinic.  Admitted with hyponatremia , failure to thrive and poor p.o. Intake. Notes some mild headaches.  No other acute new focal neurological deficits.  Has known history of brain mets status post whole brain radiation therapy in June.  Recent admission with biliary stent obstruction and cholangitis treated with antibiotics. Discussed and patient is agreeable with interval imaging studies including CT chest abdomen pelvis and an MRI of the brain. Sodium levels gradually improving.  Getting IV fluids.  Added sodium tablets.  Recommended improved p.o. Intake.     OBJECTIVE:  NAD  PHYSICAL EXAMINATION: . Vitals:   05/02/18 0544 05/02/18 1230 05/02/18 2050 05/03/18 0523  BP: 95/63 105/75 138/80 118/63  Pulse: 69 69 65 78  Resp: 18  18 16   Temp: 98.6 F (37 C) 98 F (36.7 C) 98.2 F (36.8 C) 99.1 F (37.3 C)  TempSrc: Oral Oral Oral Oral  SpO2: 94% 97% 98% 93%  Weight:      Height:       Filed Weights   05/01/18 1237  Weight: 119 lb 9.6 oz (54.3 kg)   .Body mass index is 21.19 kg/m.  GENERAL:alert, in no acute distress and comfortable, fatigued appearing  sKIN: skin color, texture, turgor are normal, no rashes or significant lesions EYES: normal, conjunctiva are pink and non-injected, sclera clear OROPHARYNX:no exudate, no erythema and lips, buccal mucosa, and tongue normal  NECK: supple, no JVD, thyroid normal size, non-tender, without nodularity LYMPH:  no palpable lymphadenopathy in the cervical, axillary or inguinal LUNGS: clear to auscultation with normal respiratory effort HEART: regular rate & rhythm,  no murmurs and no lower extremity edema ABDOMEN: abdomen soft, non-tender, normoactive bowel sounds  Musculoskeletal: no cyanosis of digits and no clubbing  PSYCH: alert & oriented x 3  with fluent speech NEURO: no focal motor/sensory deficits  MEDICAL HISTORY:  Past Medical History:  Diagnosis Date  . Allergy   . Back pain   . Osteopenia   . Small cell lung cancer ([facility]) 10/17/2017   Extended stage with metastases to liver and bone  . Tubular adenoma of colon 10/26/2014    SURGICAL HISTORY: Past Surgical History:  Procedure Laterality Date  . BILIARY STENT PLACEMENT N/A 04/14/2018   Procedure: BILIARY STENT PLACEMENT;  Surgeon: [name], MD;  Location: [facility];  Service: Gastroenterology;  Laterality: N/A;  . COLONOSCOPY    . COLONOSCOPY WITH PROPOFOL N/A 10/25/2014   Procedure: COLONOSCOPY WITH PROPOFOL;  Surgeon: Garlan Fair, MD;  Location: [facility];  Service: Endoscopy;  Laterality: N/A;  . ERCP N/A 10/15/2017   Procedure: ENDOSCOPIC RETROGRADE CHOLANGIOPANCREATOGRAPHY (ERCP);  Surgeon: Clarene Essex, MD;  Location: [facility];  Service: Endoscopy;  Laterality: N/A;  . ERCP N/A 04/14/2018   Procedure: ENDOSCOPIC RETROGRADE CHOLANGIOPANCREATOGRAPHY (ERCP);  Surgeon: [name], MD;  Location: [facility];  Service: Gastroenterology;  Laterality: N/A;  with stent change and Balloon Sweep  . IR FLUORO GUIDE PORT INSERTION RIGHT  11/10/2017  . IR RADIOLOGIST EVAL & MGMT  10/02/2017  . IR US GUIDE VASC ACCESS RIGHT  11/10/2017  . PORT-A-CATH REMOVAL N/A 04/16/2018   Procedure: REMOVAL PORT-A-CATH;  Surgeon: Clovis Riley, MD;  Location: [facility];  Service: General;  Laterality: N/A;    SOCIAL HISTORY: Social History   Socioeconomic History  . Marital status: Single    Spouse  name: Not on file  . Number of children: Not on file  . Years of education: Not on file  . Highest education level: Not on file  Occupational History  . Not on file  Social Needs  . Financial resource strain: Not on file  . Food insecurity:    Worry: Not on file    Inability: Not on file  . Transportation needs:    Medical: Not on file    Non-medical: Not on file    Tobacco Use  . Smoking status: Former Smoker    Packs/day: 1.00    Years: 40.00    Pack years: 40.00    Types: Cigarettes    Last attempt to quit: 09/24/2017    Years since quitting: 0.6  . Smokeless tobacco: Never Used  Substance and Sexual Activity  . Alcohol use: Yes    Comment: rare social  . Drug use: No  . Sexual activity: Not Currently  Lifestyle  . Physical activity:    Days per week: Not on file    Minutes per session: Not on file  . Stress: Not on file  Relationships  . Social connections:    Talks on phone: Not on file    Gets together: Not on file    Attends religious service: Not on file    Active member of club or organization: Not on file    Attends meetings of clubs or organizations: Not on file    Relationship status: Not on file  . Intimate partner violence:    Fear of current or ex partner: Not on file    Emotionally abused: Not on file    Physically abused: Not on file    Forced sexual activity: Not on file  Other Topics Concern  . Not on file  Social History Narrative  . Not on file    FAMILY HISTORY: Family History  Problem Relation Age of Onset  . Diabetes Mother   . Hypertension Mother   . Diabetes Sister   . Hypertension Sister   . Hyperlipidemia Sister     ALLERGIES:  has No Known Allergies.  MEDICATIONS:  Scheduled Meds: . calcium carbonate  1 tablet Oral Daily  . heparin  5,000 Units Subcutaneous Q8H  . hydrocortisone  10 mg Oral Q lunch  . hydrocortisone  20 mg Oral Q breakfast  . multivitamin with minerals  1 tablet Oral Daily  . potassium chloride SA  20 mEq Oral Daily  . senna-docusate  2 tablet Oral BID  . sodium chloride flush  3 mL Intravenous Q12H  . sodium chloride  1 g Oral TID WC   Continuous Infusions: . sodium chloride    . sodium chloride 150 mL/hr at 05/03/18 1003  . potassium chloride 10 mEq (05/03/18 1015)   PRN Meds:.sodium chloride, acetaminophen **OR** acetaminophen, albuterol, LORazepam, magic  mouthwash w/lidocaine, metoCLOPramide, [DISCONTINUED] ondansetron **OR** ondansetron (ZOFRAN) IV, ondansetron, oxyCODONE, polyethylene glycol, prochlorperazine, sodium chloride flush, sodium chloride flush, traMADol  REVIEW OF SYSTEMS:    10 Point review of Systems was done is negative except as noted above.   LABORATORY DATA:  I have reviewed the data as listed  . CBC Latest Ref Rng & Units 05/02/2018 05/01/2018 04/30/2018  WBC 4.0 - 10.5 K/uL 6.5 7.4 6.6  Hemoglobin 12.0 - 15.0 g/dL 10.2(L) 11.1(L) 11.8  Hematocrit 36.0 - 46.0 % 29.0(L) 32.8(L) 35.2  Platelets 150 - 400 K/uL 354 374 415(H)    . CMP Latest Ref Rng & Units 05/02/2018  05/02/2018  Glucose 70 - 99 mg/dL 119(H) 75  BUN 8 - 23 mg/dL <5(L) <5(L)  Creatinine 0.44 - 1.00 mg/dL 0.34(L) <0.30(L)  Sodium 135 - 145 mmol/L 121(L) 120(L)  Potassium 3.5 - 5.1 mmol/L 4.1 2.8(L)  Chloride 98 - 111 mmol/L 90(L) 86(L)  CO2 22 - 32 mmol/L 22 23  Calcium 8.9 - 10.3 mg/dL 7.8(L) 7.5(L)  Total Protein 6.5 - 8.1 g/dL - -  Total Bilirubin 0.3 - 1.2 mg/dL - -  Alkaline Phos 38 - 126 U/L - -  AST 15 - 41 U/L - -  ALT 0 - 44 U/L - -     RADIOGRAPHIC STUDIES: I have personally reviewed the radiological images as listed and agreed with the findings in the report.  ASSESSMENT & PLAN:   67 y.o. female with  1. 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  Liver mets + Bone mets + adrenal mets Pancreatic mets- FNA confirmed MRI brain 10/29/2017 after this clinic visit showed concern for multiple asymptomatic subcentimeter brain mets. 11/28/17 Brain MRI which revealed stable lesions, and new nonenhancing lesions. I discussed this imaging study with Dr Ulis Rias our neuro-oncologist -- no area of critical brain mets that needs patient to rush into WBRT at this time.  12/26/17 PET which revealed Marked  improvement in the previously demonstrated widespread hypermetabolic metastatic disease. There is only mild residual hypermetabolic activity within some of the osseous lesions.   patient has completed 4 cycles of carboplatin/etoposide/Atezolizumab and completed -completed WBRT 30 Gy in 10 fractions 01/15/2018 - 01/29/2018   CT abd 8/18-- concern for progression of mets in liver.  2. Hyponatremia - poor po intake + adrenal insufficiency+ ?SIADH.  Readmitted again this hospitalization 3. Hypokalemia.  Has been a recurrent issue due to decreased p.o. Intake. PLAN -mx per hospitalist -IVF and stress dose steroids -Started on sodium chloride tablets one 1 g 3 times daily with meals -Nutritional therapy consultation for optimizing p.o. Intake. -We will get MRI of the brain to look for interval concerns for progression especially in the setting of some headaches and to determine if this is the cause of her hyponatremia. -Also will get a CT chest abdomen pelvis to reevaluate the status of her disease to better help define goals of care. -Continued goals of care discussions. -We will follow-up with imaging studies tomorrow for determining next steps in treatment of her small cell lung cancer. -Seen on previous hospitalization and there was a plan to see her in clinic and consider second line pembrolizumab therapy due to concerns for progression in the liver at the time.    3.s/p previous obstructive Jaundice due to biliary compression from pancreatic mass  -ERCP attempted by Dr Watt Climes - not successful -ERCP completed on 10/24/17 with Dr. Harley Hallmark with much improved LFTs -Bx of pancreatic lesion -consistent with small cell lung cancer. Now with abnormal LFTs and concerns with stent blockages and possible biliary infection. Rpt ERCP and restenting 04/15/2018 - concern for A single localized biliary stricture was found in the lower third of the main bile duct. 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 - The lower third of the main bile duct was dilated One partially covered metal stent was placed into the common bile duct. -improving LFTs post re stenting  4. Back pain related to pathologic L2 fracture (causing elevated alkaline phosphatase levels.) -  stable  5. S/p Sepsis with Enterobacteriaceae and Klebsiella pneumnoias - likely hepatobiliary source in the setting of abnormal LFTs and likely blocked biliary stent.  PLAN -Patient is status post antibiotic treatment for her biliary tract sepsis.  LFTs have improved after addressing her biliary stricture. -We will reassess status of her biliary tree on her CT abdomen.  We will continue to follow  I spent 25 minutes counseling the patient face to face. The total time spent in the appointment was 35 minutes and more than 50% was on counseling and direct patient cares.    Sullivan Lone MD [place] AAHIVMS [facility] Hematology/Oncology Physician [facility]  (Office):       [phone] (Work cell):  [phone] (Fax):           [phone]

## 2018-05-04 ENCOUNTER — Ambulatory Visit: Payer: Medicare Other

## 2018-05-04 ENCOUNTER — Ambulatory Visit: Payer: Medicare Other | Admitting: Hematology

## 2018-05-04 ENCOUNTER — Other Ambulatory Visit: Payer: Medicare Other

## 2018-05-04 LAB — BASIC METABOLIC PANEL
Anion gap: 9 (ref 5–15)
Anion gap: 9 (ref 5–15)
BUN: <5 mg/dL — ABNORMAL LOW (ref 8–23)
CHLORIDE: 94 mmol/L — AB (ref 98–111)
CO2: 24 mmol/L (ref 22–32)
CO2: 23 mmol/L (ref 22–32)
CREATININE: 0.33 mg/dL — AB (ref 0.44–1.00)
Calcium: 7.7 mg/dL — ABNORMAL LOW (ref 8.9–10.3)
Calcium: 7.6 mg/dL — ABNORMAL LOW (ref 8.9–10.3)
Chloride: 96 mmol/L — ABNORMAL LOW (ref 98–111)
Creatinine, Ser: 0.42 mg/dL — ABNORMAL LOW (ref 0.44–1.00)
GFR calc Af Amer: >60 mL/min (ref >60)
GFR calc Af Amer: >60 mL/min (ref >60)
GFR calc non Af Amer: >60 mL/min (ref >60)
GLUCOSE: 82 mg/dL (ref 70–99)
GLUCOSE: 150 mg/dL — AB (ref 70–99)
POTASSIUM: 3.4 mmol/L — AB (ref 3.5–5.1)
POTASSIUM: 3.5 mmol/L (ref 3.5–5.1)
Sodium: 127 mmol/L — ABNORMAL LOW (ref 135–145)
Sodium: 128 mmol/L — ABNORMAL LOW (ref 135–145)

## 2018-05-04 LAB — CORTISOL-AM, BLOOD: Cortisol - AM: 10.6 ug/dL (ref 6.7–22.6)

## 2018-05-04 MED ORDER — MIRTAZAPINE 15 MG PO TABS
15.0000 mg | ORAL_TABLET | Freq: Every day | ORAL | Status: DC
  Administered 2018-05-04: 15 mg via ORAL
  Filled 2018-05-04: qty 1

## 2018-05-04 MED ORDER — TRAMADOL HCL 50 MG PO TABS: 50.0000 mg | ORAL_TABLET | Freq: Four times a day (QID) | ORAL | Status: DC | PRN

## 2018-05-04 MED ORDER — FAMOTIDINE 10 MG PO TABS
10.0000 mg | ORAL_TABLET | Freq: Two times a day (BID) | ORAL | Status: DC
  Administered 2018-05-04 – 2018-05-05 (×3): 10 mg via ORAL
  Filled 2018-05-04 (×4): qty 1

## 2018-05-04 NOTE — Care Management Note (Signed)
Case Management Note  Patient Details  Name: Haley [name] MRN: [id number] Date of Birth: 1951/04/02  Subjective/Objective:66 y/o f admitted w/hyponatremia. From home. Readmit. Has pcp,pharmacy,support,transp.No CM needs.                    Action/Plan:d/c home.   Expected Discharge Date:                  Expected Discharge Plan:  Home/Self Care  In-House Referral:     Discharge planning Services  CM Consult  Post Acute Care Choice:    Choice offered to:     DME Arranged:    DME Agency:     HH Arranged:    [place] Agency:     Status of Service:  Completed, signed off  If discussed at [employer] of Stay Meetings, dates discussed:    Additional Comments:  Dessa Phi, RN 05/04/2018, 11:59 AM

## 2018-05-04 NOTE — Progress Notes (Signed)
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  Patient was sent from oncology office due to persistent hyponatremia.  Patient was seen on 9/5 due to persistent weakness and dizziness, found to have sodium of 117 was given IV fluid and return it on 9/6 for further evaluation.  Labs show sodium of 116 therefore she was sent to the emergency department for further evaluation and management of hyponatremia.  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  Subjective: More energy today, sodium continues to slowly improve. 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 Urine Na+ 68. Check BMP every 12 hours avoid correction above 10 mEq in 24 hrs. Continue NS to 150 cc/hr. Continue salt tablets 2G daily. Cortisol normal, continue cortef. No neurological symptoms at this time. TSH normal.  Monitor for signs of fluid overload, if Na+ ~ 130 in AM can d/c home. Case discussed with nephrology.   Small cell carcinoma of the lung Extended metastases.  Patient completed chemotherapy and radiation. Continue management per oncology. Started on Remeron to help with increase in appetite   Hypokalemia  Stable, check BMP and Mg in AM   DVT prophylaxis: Heparin sq  Code Status: Full Code  Family Communication: None at bedside  Disposition Plan: Home in AM if Na+ ~ 130  Consultants:   None   Procedures:   None  Antimicrobials:  None     Objective: Vitals:   05/03/18 1323 05/03/18 2112 05/04/18 0503 05/04/18 1415  BP: 129/79 (!) 141/84 128/78 121/77  Pulse: 71 68 72 67  Resp: 20 16 16 14   Temp: 99.1 F (37.3 C) 97.9 F (36.6 C) 99.6 F (37.6 C)  98.3 F (36.8 C)  TempSrc: Oral Oral Oral Oral  SpO2: 98% 97% 94% 95%  Weight:      Height:        Intake/Output Summary (Last 24 hours) at 05/04/2018 1706 Last data filed at 05/04/2018 1152 Gross per 24 hour  Intake 3722.32 ml  Output 5350 ml  Net -1627.68 ml   Filed Weights   05/01/18 1237  Weight: 54.3 kg    Examination:  General: Pt is alert, awake, not in acute distress Cardiovascular: RRR, S1/S2 +, no rubs, no gallops Respiratory: CTA bilaterally, no wheezing, no rhonchi Abdominal: Soft, NT, ND, bowel sounds + Extremities: no edema, no cyanosis  Data Reviewed: I have personally reviewed following labs and imaging studies  CBC: Recent Labs  Lab 04/30/18 0936 05/01/18 0949 05/02/18 0404  WBC 6.6 7.4 6.5  NEUTROABS 4.7 6.2  --   HGB 11.8 11.1* 10.2*  HCT 35.2 32.8* 29.0*  MCV 84.5 83.9 81.5  PLT 415* 374 638   Basic Metabolic Panel: Recent Labs  Lab 05/01/18 0949  05/02/18 0404 05/02/18 1544 05/03/18 0343 05/03/18 1557 05/04/18 0348  NA 116*   < > 120* 121* 124* 122* 127*  K 4.1   < > 2.8* 4.1 2.8* 4.5 3.4*  CL 84*   < > 86* 90* 91* 92* 94*  CO2 23   < > 23 22 23 22 24   GLUCOSE 90   < > 75 119* 75 166* 82  BUN 6*   < > <5* <5* <5* <5* <5*  CREATININE 0.55   < > <0.30* 0.34* 0.31* 0.38* 0.33*  CALCIUM 8.8*   < > 7.5* 7.8* 7.5* 8.2* 7.7*  MG 1.6*  --  1.9  --  1.8  --   --   PHOS 3.4  --   --   --   --   --   --    < > = values in this interval not displayed.   GFR: Estimated Creatinine Clearance: 58 mL/min (A) (by C-G formula based on SCr of 0.33 mg/dL (L)). Liver Function Tests: Recent Labs  Lab 04/30/18 0936  AST 23  ALT 24  ALKPHOS 187*  BILITOT 0.9  PROT 6.9  ALBUMIN 3.3*   No results for input(s): LIPASE, AMYLASE in the last 168 hours. No results for input(s): AMMONIA in the last 168 hours. Coagulation Profile: No results for input(s): INR, PROTIME in the last 168 hours. Cardiac Enzymes: No results for input(s): CKTOTAL, CKMB,  CKMBINDEX, TROPONINI in the last 168 hours. BNP (last 3 results) No results for input(s): PROBNP in the last 8760 hours. HbA1C: No results for input(s): HGBA1C in the last 72 hours. CBG: No results for input(s): GLUCAP in the last 168 hours. Lipid Profile: No results for input(s): CHOL, HDL, LDLCALC, TRIG, CHOLHDL, LDLDIRECT in the last 72 hours. Thyroid Function Tests: No results for input(s): TSH, T4TOTAL, FREET4, T3FREE, THYROIDAB in the last 72 hours. Anemia Panel: No results for input(s): VITAMINB12, FOLATE, FERRITIN, TIBC, IRON, RETICCTPCT in the last 72 hours. Sepsis Labs: No results for input(s): PROCALCITON, LATICACIDVEN in the last 168 hours.  No results found for this or any previous visit (from the past 240 hour(s)).   Radiology Studies: Ct Chest W Contrast  Result Date: 05/02/2018 CLINICAL DATA:  Extensive stage small cell right lung cancer. Failure to thrive. Hyponatremia. Restaging. Inpatient. EXAM: CT CHEST, ABDOMEN, AND PELVIS WITH CONTRAST TECHNIQUE: Multidetector CT imaging of the chest, abdomen and pelvis was performed following the standard protocol during bolus administration of intravenous contrast. CONTRAST:  156mL OMNIPAQUE IOHEXOL 300 MG/ML SOLN, 49mL ISOVUE-300 IOPAMIDOL (ISOVUE-300) INJECTION 61% COMPARISON:  04/12/2018 CT abdomen/pelvis and chest radiograph. 02/25/2018 MRI abdomen. 12/26/2017 PET-CT. 12/19/2017 chest CT. FINDINGS: CT CHEST FINDINGS Cardiovascular: Normal heart size. Trace pericardial effusion/thickening, slightly increased. Three-vessel coronary atherosclerosis. Left PICC terminates in the upper third of the SVC. Atherosclerotic nonaneurysmal thoracic aorta. Stable top-normal main pulmonary artery (3.1 cm diameter). No central pulmonary emboli. Mediastinum/Nodes: No discrete thyroid nodules. Unremarkable esophagus. No axillary adenopathy. No left hilar adenopathy. No new mediastinal adenopathy. Lungs/Pleura: No pneumothorax. Stable trace dependent  right pleural effusion. No left pleural effusion. Severe centrilobular emphysema with diffuse bronchial wall thickening. Infiltrative poorly marginated right infrahilar 4.0 x 2.7 cm lung mass (series 2/image 35), contiguous with right hilar and subcarinal adenopathy, mildly increased from 3.7 x 2.1 cm on 04/12/2018 CT abdomen study. Stable complete right lower lobe and near complete right middle lobe atelectasis. No new significant pulmonary nodules. Musculoskeletal: Widespread patchy confluent sclerotic osseous metastases throughout the thoracic spine, sternum, bilateral ribs and bilateral shoulder girdles, significantly increased in extent and sclerosis since 12/19/2017 chest CT, not appreciably changed in the lower thoracic spine since 04/12/2018 CT abdomen. Mild thoracic spondylosis. CT ABDOMEN PELVIS FINDINGS Hepatobiliary: Numerous (at least 10) hypoenhancing liver masses scattered throughout the liver, increased in size and number since 04/12/2018 CT. Representative posterior right liver lobe 3.4 cm mass (series 2/image 58), increased from 2.3 cm. Representative  superior right liver lobe 2.5 cm mass (series 2/image 48), increased from 1.9 cm. Representative inferior left liver lobe 2.7 cm mass (series 2/image 65), increased from 1.6 cm. Well-positioned CBD stent with the lower tip abutting the duodenal lumen at the ampulla. Decreased intrahepatic biliary ductal dilatation with significantly increased pneumobilia. Pancreas: No discrete pancreatic mass. Stable top-normal caliber main pancreatic duct. Spleen: Normal size. No mass. Adrenals/Urinary Tract: Left adrenal 2.0 cm nodule (series 2/image 61), increased from 1.5 cm. No discrete right adrenal nodule. Normal kidneys with no hydronephrosis and no renal mass. Normal bladder. Stomach/Bowel: Normal non-distended stomach. Normal caliber small bowel with no small bowel wall thickening. Normal appendix. Moderate colonic stool. No large bowel wall thickening or  acute pericolonic fat stranding. Vascular/Lymphatic: Atherosclerotic nonaneurysmal abdominal aorta. Patent portal, splenic, hepatic and renal veins. Enlarged 1.5 cm porta hepatis node (series 2/image 62), increased from 1.1 cm. No additional pathologically enlarged lymph nodes in the abdomen or pelvis. Reproductive: Grossly normal uterus.  No adnexal mass. Other: No pneumoperitoneum. No focal fluid collection. Trace pelvic ascites. Stable subcutaneous 1.6 cm cystic lesion in the ventral right pelvic wall (series 2/image 111), probably a sebaceous cyst. Musculoskeletal: Widespread patchy confluent sclerotic osseous metastases throughout the lumbar spine and bilateral pelvic girdle, not appreciably changed since 04/12/2018 CT. Stable pathologic L2 vertebral compression fracture with stable 5 mm retropulsion of L2 posterior vertebral osseous fragments. IMPRESSION: 1. Numerous liver metastases, increased in size and number since 04/12/2018 CT. 2. 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 3. Porta hepatis adenopathy is mildly increased and suspicious for nodal metastasis. 4. Right infrahilar infiltrative lung mass is mildly increased in size. 5. Widespread patchy confluent sclerotic osseous metastatic disease throughout the axial and proximal appendicular skeleton, not appreciably changed since 04/12/2018 CT abdomen/pelvis study, clearly progressed since 12/19/2017 chest CT. Stable L2 vertebral pathologic fracture. 6. Decreased intrahepatic biliary ductal dilatation with improved pneumobilia status post CBD stent revision. 7. Trace pericardial effusion, slightly increased. Trace right pleural effusion, stable. Trace pelvic ascites. 8. Aortic Atherosclerosis (ICD10-I70.0) and Emphysema (ICD10-J43.9). Electronically Signed   By: Ilona Sorrel M.D.   On: 05/02/2018 20:50   Ct Abdomen Pelvis W Contrast  Result Date: 05/02/2018 CLINICAL DATA:  Extensive stage small cell right lung cancer. Failure to thrive.  Hyponatremia. Restaging. Inpatient. EXAM: CT CHEST, ABDOMEN, AND PELVIS WITH CONTRAST TECHNIQUE: Multidetector CT imaging of the chest, abdomen and pelvis was performed following the standard protocol during bolus administration of intravenous contrast. CONTRAST:  128mL OMNIPAQUE IOHEXOL 300 MG/ML SOLN, 104mL ISOVUE-300 IOPAMIDOL (ISOVUE-300) INJECTION 61% COMPARISON:  04/12/2018 CT abdomen/pelvis and chest radiograph. 02/25/2018 MRI abdomen. 12/26/2017 PET-CT. 12/19/2017 chest CT. FINDINGS: CT CHEST FINDINGS Cardiovascular: Normal heart size. Trace pericardial effusion/thickening, slightly increased. Three-vessel coronary atherosclerosis. Left PICC terminates in the upper third of the SVC. Atherosclerotic nonaneurysmal thoracic aorta. Stable top-normal main pulmonary artery (3.1 cm diameter). No central pulmonary emboli. Mediastinum/Nodes: No discrete thyroid nodules. Unremarkable esophagus. No axillary adenopathy. No left hilar adenopathy. No new mediastinal adenopathy. Lungs/Pleura: No pneumothorax. Stable trace dependent right pleural effusion. No left pleural effusion. Severe centrilobular emphysema with diffuse bronchial wall thickening. Infiltrative poorly marginated right infrahilar 4.0 x 2.7 cm lung mass (series 2/image 35), contiguous with right hilar and subcarinal adenopathy, mildly increased from 3.7 x 2.1 cm on 04/12/2018 CT abdomen study. Stable complete right lower lobe and near complete right middle lobe atelectasis. No new significant pulmonary nodules. Musculoskeletal: Widespread patchy confluent sclerotic osseous metastases throughout the thoracic spine, sternum, bilateral ribs and  bilateral shoulder girdles, significantly increased in extent and sclerosis since 12/19/2017 chest CT, not appreciably changed in the lower thoracic spine since 04/12/2018 CT abdomen. Mild thoracic spondylosis. CT ABDOMEN PELVIS FINDINGS Hepatobiliary: Numerous (at least 10) hypoenhancing liver masses scattered  throughout the liver, increased in size and number since 04/12/2018 CT. Representative posterior right liver lobe 3.4 cm mass (series 2/image 58), increased from 2.3 cm. Representative superior right liver lobe 2.5 cm mass (series 2/image 48), increased from 1.9 cm. Representative inferior left liver lobe 2.7 cm mass (series 2/image 65), increased from 1.6 cm. Well-positioned CBD stent with the lower tip abutting the duodenal lumen at the ampulla. Decreased intrahepatic biliary ductal dilatation with significantly increased pneumobilia. Pancreas: No discrete pancreatic mass. Stable top-normal caliber main pancreatic duct. Spleen: Normal size. No mass. Adrenals/Urinary Tract: Left adrenal 2.0 cm nodule (series 2/image 61), increased from 1.5 cm. No discrete right adrenal nodule. Normal kidneys with no hydronephrosis and no renal mass. Normal bladder. Stomach/Bowel: Normal non-distended stomach. Normal caliber small bowel with no small bowel wall thickening. Normal appendix. Moderate colonic stool. No large bowel wall thickening or acute pericolonic fat stranding. Vascular/Lymphatic: Atherosclerotic nonaneurysmal abdominal aorta. Patent portal, splenic, hepatic and renal veins. Enlarged 1.5 cm porta hepatis node (series 2/image 62), increased from 1.1 cm. No additional pathologically enlarged lymph nodes in the abdomen or pelvis. Reproductive: Grossly normal uterus.  No adnexal mass. Other: No pneumoperitoneum. No focal fluid collection. Trace pelvic ascites. Stable subcutaneous 1.6 cm cystic lesion in the ventral right pelvic wall (series 2/image 111), probably a sebaceous cyst. Musculoskeletal: Widespread patchy confluent sclerotic osseous metastases throughout the lumbar spine and bilateral pelvic girdle, not appreciably changed since 04/12/2018 CT. Stable pathologic L2 vertebral compression fracture with stable 5 mm retropulsion of L2 posterior vertebral osseous fragments. IMPRESSION: 1. Numerous liver metastases,  increased in size and number since 04/12/2018 CT. 2. 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 3. Porta hepatis adenopathy is mildly increased and suspicious for nodal metastasis. 4. Right infrahilar infiltrative lung mass is mildly increased in size. 5. Widespread patchy confluent sclerotic osseous metastatic disease throughout the axial and proximal appendicular skeleton, not appreciably changed since 04/12/2018 CT abdomen/pelvis study, clearly progressed since 12/19/2017 chest CT. Stable L2 vertebral pathologic fracture. 6. Decreased intrahepatic biliary ductal dilatation with improved pneumobilia status post CBD stent revision. 7. Trace pericardial effusion, slightly increased. Trace right pleural effusion, stable. Trace pelvic ascites. 8. Aortic Atherosclerosis (ICD10-I70.0) and Emphysema (ICD10-J43.9). Electronically Signed   By: Ilona Sorrel M.D.   On: 05/02/2018 20:50     Scheduled Meds: . calcium carbonate  1 tablet Oral Daily  . famotidine  10 mg Oral BID  . heparin  5,000 Units Subcutaneous Q8H  . hydrocortisone  10 mg Oral Q lunch  . hydrocortisone  20 mg Oral Q breakfast  . multivitamin with minerals  1 tablet Oral Daily  . potassium chloride SA  20 mEq Oral Daily  . senna-docusate  2 tablet Oral BID  . sodium chloride flush  3 mL Intravenous Q12H  . sodium chloride  2 g Oral TID WC   Continuous Infusions: . sodium chloride    . sodium chloride 150 mL/hr at 05/04/18 1650     LOS: 2 days   Time spent: Total of 25 minutes spent with pt, greater than 50% of which was spent in discussion of  treatment, counseling and coordination of care   Chipper Oman, MD Pager: Text Page via [email or web address]   If 7PM-7AM, please contact  night-coverage [email or web address] 05/04/2018, 5:06 PM   Note - This record has been created using [employer]. Chart creation errors have been sought, but may not always have been located. Such creation errors do not reflect on the standard of medical care.

## 2018-05-04 NOTE — Progress Notes (Signed)
   05/04/18 1843  What Happened  Was fall witnessed? No  Was patient injured? No  Patient found other (Comment) (pt stated she fell when RN rounded)  Found by No one-pt stated they fell  Stated prior activity other (comment) (had 1 leg up on bed looking a dark skin spots)  Follow Up  MD notified Dr. Quincy Simmonds  Time MD notified [phone]  Family notified Yes-comment (Son at bedside and witnessed fall)  Time family notified 1845  Additional tests No  Progress note created (see row info) Yes  Adult Fall Risk Assessment  Risk Factor Category (scoring not indicated) Fall has occurred during this admission (document High fall risk)  Patient Fall Risk Level High fall risk  Adult Fall Risk Interventions  Required Bundle Interventions *See Row Information* High fall risk - low, moderate, and high requirements implemented  Additional Interventions Family Supervision;Use of appropriate toileting equipment (bedpan, BSC, etc.);Other (Comment) (high fall risk interventions initiated)  Screening for Fall Injury Risk (To be completed on HIGH fall risk patients) - Assessing Need for Low Bed  Risk For Fall Injury- Low Bed Criteria None identified - Continue screening  Screening for Fall Injury Risk (To be completed on HIGH fall risk patients who do not meet crieteria for Low Bed) - Assessing Need for Floor Mats Only  Risk For Fall Injury- Criteria for Floor Mats None identified - No additional interventions needed  Vitals  BP (!) 145/72  MAP (mmHg) 94  BP Location Right Arm  BP Method Automatic  Patient Position (if appropriate) Lying  Pulse Rate 72  Pulse Rate Source Monitor  Resp 20  Oxygen Therapy  SpO2 98 %  O2 Device Room Air  Pain Assessment  Pain Scale 0-10  Pain Score 1  Pain Type Chronic pain  Pain Location Back  Pain Orientation Lower  Pain Descriptors / Indicators Aching  Pain Frequency Constant  Pain Onset On-going  Patients Stated Pain Goal 2  PCA/Epidural/Spinal Assessment   Respiratory Pattern Regular;Unlabored  Neurological  Neuro (WDL) WDL  Level of Consciousness Alert  Orientation Level Oriented X4  Glasgow Coma Scale  Eye Opening 4  Best Verbal Response (NON-intubated) 5  Best Motor Response 6  Glasgow Coma Scale Score 15  Musculoskeletal  Musculoskeletal (WDL) WDL  Assistive Device None  Generalized Weakness Yes  Integumentary  Integumentary (WDL) X    Pt stated she had 1 leg up on bed to get closer look at dark skin spots. When taking leg down off bed pt stated she lost balance and fell.

## 2018-05-04 NOTE — Progress Notes (Signed)
Chaplain introduced spiritual care as resource, provided assessment, spiritual support at bedside   Pt's son, Simona Huh, present during encounter.  Bryanna reflected on hopes for her retirement and hopes to travel.  Connected these hopes to values that she holds.   Expresses hope that she still will be able engage in some travel - most notably to [facility], but is having to re-imagine these goals.

## 2018-05-05 ENCOUNTER — Encounter

## 2018-05-05 ENCOUNTER — Ambulatory Visit: Payer: PRIVATE HEALTH INSURANCE | Admitting: Endocrinology

## 2018-05-05 DIAGNOSIS — C7931 Secondary malignant neoplasm of brain: Secondary | ICD-10-CM

## 2018-05-05 DIAGNOSIS — C799 Secondary malignant neoplasm of unspecified site: Secondary | ICD-10-CM

## 2018-05-05 LAB — BASIC METABOLIC PANEL
Anion gap: 10 (ref 5–15)
CALCIUM: 8 mg/dL — AB (ref 8.9–10.3)
CHLORIDE: 95 mmol/L — AB (ref 98–111)
CO2: 25 mmol/L (ref 22–32)
GLUCOSE: 74 mg/dL (ref 70–99)
Potassium: 2.6 mmol/L — CL (ref 3.5–5.1)
Sodium: 130 mmol/L — ABNORMAL LOW (ref 135–145)

## 2018-05-05 LAB — MAGNESIUM: Magnesium: 1.8 mg/dL (ref 1.7–2.4)

## 2018-05-05 MED ORDER — POTASSIUM CHLORIDE 10 MEQ/100ML IV SOLN
10.0000 meq | INTRAVENOUS | Status: AC
  Administered 2018-05-05 (×3): 10 meq via INTRAVENOUS
  Filled 2018-05-05 (×3): qty 100

## 2018-05-05 MED ORDER — POTASSIUM CHLORIDE CRYS ER 20 MEQ PO TBCR: 40.0000 meq | EXTENDED_RELEASE_TABLET | Freq: Every day | ORAL | 0 refills | Status: DC

## 2018-05-05 MED ORDER — MAGNESIUM SULFATE 2 GM/50ML IV SOLN
2.0000 g | Freq: Once | INTRAVENOUS | Status: AC
  Administered 2018-05-05: 2 g via INTRAVENOUS
  Filled 2018-05-05: qty 50

## 2018-05-05 MED ORDER — DRONABINOL 2.5 MG PO CAPS: 2.5000 mg | ORAL_CAPSULE | Freq: Two times a day (BID) | ORAL | 0 refills | Status: DC

## 2018-05-05 MED ORDER — SODIUM CHLORIDE 1 G PO TABS: 2.0000 g | ORAL_TABLET | Freq: Three times a day (TID) | ORAL | 0 refills | Status: DC

## 2018-05-05 MED ORDER — POTASSIUM CHLORIDE CRYS ER 20 MEQ PO TBCR
40.0000 meq | EXTENDED_RELEASE_TABLET | Freq: Three times a day (TID) | ORAL | Status: DC
  Administered 2018-05-05: 40 meq via ORAL
  Filled 2018-05-05: qty 2

## 2018-05-05 NOTE — Discharge Summary (Signed)
Physician Discharge Summary  BAILEIGH MODISETTE  TGG:[id number]  DOB: 1951/04/24  DOA: 05/01/2018 PCP: Alycia Rossetti, MD  Admit date: 05/01/2018 Discharge date: 05/05/2018  Admitted From: Home  Disposition:  Home   Recommendations for Outpatient Follow-up:  1. Follow up with PCP in 1 week  2. Follow-up with oncology in 1-2 weeks  3. 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  Patient was sent from oncology office due to persistent hyponatremia.  Patient was seen on 9/5 due to persistent weakness and dizziness, found to have sodium of 117 was given IV fluid and return it on 9/6 for further evaluation.  Labs show sodium of 116 therefore she was sent to the emergency department for further evaluation and management of hyponatremia.  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  Subjective: Feeling well this morning, has no complaints today. Per nursing staff patient fell last night with no injuries. 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 Urine Na+ 68. Marland Kitchen Continue NS to 150  cc/hr. and was treated with IV fluids NS at 150 cc an hour and salt tablets 2 g 3 times daily.  Cortisol levels were normal and did not felt that this was adrenal insufficiency at this time.  No neurological symptoms, TSH normal.  Patient will be discharged on salt tablet 2 g 3 times daily for 3 more days.  Check BMP in 1 week.  Encourage regular diet with no limitations.  Encourage oral hydration as well. Follow up with PCP.  Small cell carcinoma of the lung Severe Extended metastases. Patient completed chemotherapy and radiation. Continue management per oncology.   Adult failure to thrive  Patient was started on Remeron, however this can cause HypoNa+, will switch to Marinol 2.5 mg BID, follow with PCP  Continue protein supplements   Hypokalemia  Repleted check BMP and Mag in 1 week   All other chronic medical condition were stable during the hospitalization.  On the day of the discharge the patient's vitals were stable, and no other acute medical condition were reported by patient. the patient was felt safe to be discharge to home   Discharge Instructions  You were cared for by a hospitalist during your hospital stay. If you have any questions about your discharge medications or the care you received while you were in the hospital after you are discharged, you can call the unit and asked to speak with the hospitalist on call if the hospitalist that took care of you is not available. Once you are discharged, your primary care physician will handle any further medical issues. Please note  that NO REFILLS for any discharge medications will be authorized once you are discharged, as it is imperative that you return to your primary care physician (or establish a relationship with a primary care physician if you do not have one) for your aftercare needs so that they can reassess your need for medications and monitor your lab values.  Discharge Instructions    Call MD for:  difficulty breathing,  headache or visual disturbances   Complete by:  As directed    Call MD for:  extreme fatigue   Complete by:  As directed    Call MD for:  hives   Complete by:  As directed    Call MD for:  persistant dizziness or light-headedness   Complete by:  As directed    Call MD for:  persistant nausea and vomiting   Complete by:  As directed    Call MD for:  redness, tenderness, or signs of infection (pain, swelling, redness, odor or green/yellow discharge around incision site)   Complete by:  As directed    Call MD for:  severe uncontrolled pain   Complete by:  As directed    Call MD for:  temperature >100.4   Complete by:  As directed    Diet general   Complete by:  As directed    Increase activity slowly   Complete by:  As directed      Allergies as of 05/05/2018   No Known Allergies     Medication List    STOP taking these medications   LORazepam 0.5 MG tablet Commonly known as:  ATIVAN   magic mouthwash w/lidocaine Soln   oxyCODONE 5 MG immediate release tablet Commonly known as:  Oxy IR/ROXICODONE   sucralfate 1 GM/10ML suspension Commonly known as:  CARAFATE     TAKE these medications   calcium carbonate 1250 (500 Ca) MG tablet Commonly known as:  OS-CAL - dosed in mg of elemental calcium Take 1 tablet by mouth daily.   cholecalciferol 1000 units tablet Commonly known as:  VITAMIN D Take 1,000 Units by mouth daily.   dronabinol 2.5 MG capsule Commonly known as:  MARINOL Take 1 capsule (2.5 mg total) by mouth 2 (two) times daily before lunch and supper.   famotidine 10 MG chewable tablet Commonly known as:  PEPCID AC Chew 10 mg by mouth 2 (two) times daily.   hydrocortisone 10 MG tablet Commonly known as:  CORTEF Take 2 tablets (20 mg total) by mouth daily with breakfast. And 10mg  po daily with lunch. What changed:    how much to take  when to take this  additional instructions   lidocaine-prilocaine cream Commonly known as:  EMLA Apply to affected  area once   metoCLOPramide 10 MG tablet Commonly known as:  REGLAN Take 1 tablet (10 mg total) by mouth every 8 (eight) hours as needed for nausea.   multivitamin with minerals tablet Take 1 tablet by mouth daily.   ondansetron 4 MG tablet Commonly known as:  ZOFRAN Take 1 tablet (4 mg total) by mouth every 8 (eight) hours as needed for nausea or vomiting.   polyethylene glycol packet Commonly known as:  MIRALAX / GLYCOLAX Take 17 g by mouth daily as needed for moderate constipation.   potassium chloride SA 20 MEQ tablet Commonly known as:  K-DUR,KLOR-CON Take 2 tablets (40 mEq total) by mouth daily. Take for 7 days then repeat labs. What changed:  how much to take   prochlorperazine 10 MG  tablet Commonly known as:  COMPAZINE Take 1 tablet (10 mg total) by mouth every 6 (six) hours as needed (Nausea or vomiting).   senna-docusate 8.6-50 MG tablet Commonly known as:  Senokot-S Take 2 tablets by mouth at bedtime. What changed:    when to take this  reasons to take this   sodium chloride 1 g tablet Take 2 tablets (2 g total) by mouth 3 (three) times daily with meals for 3 days.   traMADol 50 MG tablet Commonly known as:  ULTRAM Take 50 mg by mouth every 6 (six) hours as needed (pain.).      Follow-up Information    [facility] Follow [facility].   Specialty:  [place] Why:  [facility] nursing/aide/social worker Contact information: [place] [address] [phone]        Alycia Rossetti, MD. Schedule an appointment as soon as possible for a visit in 1 week(s).   Specialty:  Family Medicine Why:  Hospital follow up  Contact information: [address] [place] [address] [phone]          No Known Allergies  Consultations:  Oncology   Procedures/Studies: Ct Head Wo Contrast  Result Date: 04/12/2018 CLINICAL DATA:  History of liver cancer and small cell lung cancer. Last chemotherapy 2 weeks  ago. Abdominal pain and altered mental status. EXAM: CT HEAD WITHOUT CONTRAST TECHNIQUE: Contiguous axial images were obtained from the base of the skull through the vertex without intravenous contrast. COMPARISON:  02/24/2018 FINDINGS: Brain: No mass effect or midline shift. No abnormal extra-axial fluid collections. No ventricular dilatation. There is vague low-attenuation change in the left anterior frontal white matter. This is similar to previous study and likely represents an area of small vessel ischemic change. If there is high clinical suspicion for intracranial metastasis, then contrast-enhanced MR would be the more sensitive examination. Gray-white matter junctions are distinct. Basal cisterns are not effaced. No acute intracranial hemorrhage. Vascular: Mild intracranial arterial calcifications are present. Skull: Calvarium appears intact. No acute depressed skull fractures or destructive bone lesions identified. Sinuses/Orbits: There is an air-fluid level in the left sphenoid sinus, similar previous study. This is likely inflammatory. Paranasal sinuses and mastoid air cells are otherwise clear. Other: None. IMPRESSION: No acute intracranial abnormalities. Focal low-attenuation change in the left anterior frontal white matter is similar to previous study and likely represents an area of small vessel ischemic change. If there is high clinical suspicion for intracranial metastasis, then contrast-enhanced MR would be the more sensitive examination. Electronically Signed   By: Lucienne Capers M.D.   On: 04/12/2018 23:24   Ct Chest W Contrast  Result Date: 05/02/2018 CLINICAL DATA:  Extensive stage small cell right lung cancer. Failure to thrive. Hyponatremia. Restaging. Inpatient. EXAM: CT CHEST, ABDOMEN, AND PELVIS WITH CONTRAST TECHNIQUE: Multidetector CT imaging of the chest, abdomen and pelvis was performed following the standard protocol during bolus administration of intravenous contrast. CONTRAST:   152mL OMNIPAQUE IOHEXOL 300 MG/ML SOLN, 34mL ISOVUE-300 IOPAMIDOL (ISOVUE-300) INJECTION 61% COMPARISON:  04/12/2018 CT abdomen/pelvis and chest radiograph. 02/25/2018 MRI abdomen. 12/26/2017 PET-CT. 12/19/2017 chest CT. FINDINGS: CT CHEST FINDINGS Cardiovascular: Normal heart size. Trace pericardial effusion/thickening, slightly increased. Three-vessel coronary atherosclerosis. Left PICC terminates in the upper third of the SVC. Atherosclerotic nonaneurysmal thoracic aorta. Stable top-normal main pulmonary artery (3.1 cm diameter). No central pulmonary emboli. Mediastinum/Nodes: No discrete thyroid nodules. Unremarkable esophagus. No axillary adenopathy. No left hilar adenopathy. No new mediastinal adenopathy.  Lungs/Pleura: No pneumothorax. Stable trace dependent right pleural effusion. No left pleural effusion. Severe centrilobular emphysema with diffuse bronchial wall thickening. Infiltrative poorly marginated right infrahilar 4.0 x 2.7 cm lung mass (series 2/image 35), contiguous with right hilar and subcarinal adenopathy, mildly increased from 3.7 x 2.1 cm on 04/12/2018 CT abdomen study. Stable complete right lower lobe and near complete right middle lobe atelectasis. No new significant pulmonary nodules. Musculoskeletal: Widespread patchy confluent sclerotic osseous metastases throughout the thoracic spine, sternum, bilateral ribs and bilateral shoulder girdles, significantly increased in extent and sclerosis since 12/19/2017 chest CT, not appreciably changed in the lower thoracic spine since 04/12/2018 CT abdomen. Mild thoracic spondylosis. CT ABDOMEN PELVIS FINDINGS Hepatobiliary: Numerous (at least 10) hypoenhancing liver masses scattered throughout the liver, increased in size and number since 04/12/2018 CT. Representative posterior right liver lobe 3.4 cm mass (series 2/image 58), increased from 2.3 cm. Representative superior right liver lobe 2.5 cm mass (series 2/image 48), increased from 1.9 cm.  Representative inferior left liver lobe 2.7 cm mass (series 2/image 65), increased from 1.6 cm. Well-positioned CBD stent with the lower tip abutting the duodenal lumen at the ampulla. Decreased intrahepatic biliary ductal dilatation with significantly increased pneumobilia. Pancreas: No discrete pancreatic mass. Stable top-normal caliber main pancreatic duct. Spleen: Normal size. No mass. Adrenals/Urinary Tract: Left adrenal 2.0 cm nodule (series 2/image 61), increased from 1.5 cm. No discrete right adrenal nodule. Normal kidneys with no hydronephrosis and no renal mass. Normal bladder. Stomach/Bowel: Normal non-distended stomach. Normal caliber small bowel with no small bowel wall thickening. Normal appendix. Moderate colonic stool. No large bowel wall thickening or acute pericolonic fat stranding. Vascular/Lymphatic: Atherosclerotic nonaneurysmal abdominal aorta. Patent portal, splenic, hepatic and renal veins. Enlarged 1.5 cm porta hepatis node (series 2/image 62), increased from 1.1 cm. No additional pathologically enlarged lymph nodes in the abdomen or pelvis. Reproductive: Grossly normal uterus.  No adnexal mass. Other: No pneumoperitoneum. No focal fluid collection. Trace pelvic ascites. Stable subcutaneous 1.6 cm cystic lesion in the ventral right pelvic wall (series 2/image 111), probably a sebaceous cyst. Musculoskeletal: Widespread patchy confluent sclerotic osseous metastases throughout the lumbar spine and bilateral pelvic girdle, not appreciably changed since 04/12/2018 CT. Stable pathologic L2 vertebral compression fracture with stable 5 mm retropulsion of L2 posterior vertebral osseous fragments. IMPRESSION: 1. Numerous liver metastases, increased in size and number since 04/12/2018 CT. 2. 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 3. Porta hepatis adenopathy is mildly increased and suspicious for nodal metastasis. 4. Right infrahilar infiltrative lung mass is mildly increased in size. 5.  Widespread patchy confluent sclerotic osseous metastatic disease throughout the axial and proximal appendicular skeleton, not appreciably changed since 04/12/2018 CT abdomen/pelvis study, clearly progressed since 12/19/2017 chest CT. Stable L2 vertebral pathologic fracture. 6. Decreased intrahepatic biliary ductal dilatation with improved pneumobilia status post CBD stent revision. 7. Trace pericardial effusion, slightly increased. Trace right pleural effusion, stable. Trace pelvic ascites. 8. Aortic Atherosclerosis (ICD10-I70.0) and Emphysema (ICD10-J43.9). Electronically Signed   By: Ilona Sorrel M.D.   On: 05/02/2018 20:50   Mr Jeri Cos LX Contrast  Result Date: 05/02/2018 CLINICAL DATA:  Small cell lung cancer. Status post whole-brain radiation. Headaches. New onset of weakness and dizziness. EXAM: MRI HEAD WITHOUT AND WITH CONTRAST TECHNIQUE: Multiplanar, multiecho pulse sequences of the brain and surrounding structures were obtained without and with intravenous contrast. CONTRAST:  5 mL Gadavist COMPARISON:  MRI brain 01/12/2018. CT head without contrast 02/24/2018 and 04/12/2018. FINDINGS: Brain: Majority of lesions have decreased in  size. There 2 new or enlarging lesions, each measuring 3 mm. A 3 mm lesion is present in the medial right temporal lobe. This may be artifact from adjacent vessel. A 3 mm cortical lesion is present in the right frontal lobe on image 43 of series 11. Lesion adjacent to the right lateral ventricle now measures 5 mm, stable to slightly decreased. A lesion along the splenium of the corpus callosum is decreased in size, now measuring 4 mm. No acute infarct or hemorrhage is present. At least 3 of the lesions demonstrate restricted diffusion, compatible with small cell lung cancer. Mild periventricular white matter changes have increased since the prior study, compatible with whole-brain radiation Vascular: Flow is present in the major intracranial arteries. Skull and upper cervical  spine: The craniocervical junction is normal. Progressive sclerotic lesion is present at C2 without collapse. There is diffuse metastatic infiltration of the calvarium. Sinuses/Orbits: The paranasal sinuses are clear. There is fluid in the mastoid air cells bilaterally. No obstructing nasopharyngeal lesion is present. Globes and orbits are within normal limits. IMPRESSION: 1. Positive response to radiation therapy with interval decrease in size of most lesions. 2. New or increased size of 3 mm cortical lesion in the right frontal lobe on image 43 of series 11. 3. New 3 mm lesion along the medial right temporal lobe. This may be artifact. Please see the discussion above. 4. Increasing white matter disease likely reflects the sequela of chronic microvascular ischemia. 5. New osseous metastasis at C2 without pathologic fracture. 6. Diffuse osseous metastases throughout the calvarium. Electronically Signed   By: San Morelle M.D.   On: 05/02/2018 16:36   Ct Abdomen Pelvis W Contrast  Result Date: 05/02/2018 CLINICAL DATA:  Extensive stage small cell right lung cancer. Failure to thrive. Hyponatremia. Restaging. Inpatient. EXAM: CT CHEST, ABDOMEN, AND PELVIS WITH CONTRAST TECHNIQUE: Multidetector CT imaging of the chest, abdomen and pelvis was performed following the standard protocol during bolus administration of intravenous contrast. CONTRAST:  142mL OMNIPAQUE IOHEXOL 300 MG/ML SOLN, 64mL ISOVUE-300 IOPAMIDOL (ISOVUE-300) INJECTION 61% COMPARISON:  04/12/2018 CT abdomen/pelvis and chest radiograph. 02/25/2018 MRI abdomen. 12/26/2017 PET-CT. 12/19/2017 chest CT. FINDINGS: CT CHEST FINDINGS Cardiovascular: Normal heart size. Trace pericardial effusion/thickening, slightly increased. Three-vessel coronary atherosclerosis. Left PICC terminates in the upper third of the SVC. Atherosclerotic nonaneurysmal thoracic aorta. Stable top-normal main pulmonary artery (3.1 cm diameter). No central pulmonary emboli.  Mediastinum/Nodes: No discrete thyroid nodules. Unremarkable esophagus. No axillary adenopathy. No left hilar adenopathy. No new mediastinal adenopathy. Lungs/Pleura: No pneumothorax. Stable trace dependent right pleural effusion. No left pleural effusion. Severe centrilobular emphysema with diffuse bronchial wall thickening. Infiltrative poorly marginated right infrahilar 4.0 x 2.7 cm lung mass (series 2/image 35), contiguous with right hilar and subcarinal adenopathy, mildly increased from 3.7 x 2.1 cm on 04/12/2018 CT abdomen study. Stable complete right lower lobe and near complete right middle lobe atelectasis. No new significant pulmonary nodules. Musculoskeletal: Widespread patchy confluent sclerotic osseous metastases throughout the thoracic spine, sternum, bilateral ribs and bilateral shoulder girdles, significantly increased in extent and sclerosis since 12/19/2017 chest CT, not appreciably changed in the lower thoracic spine since 04/12/2018 CT abdomen. Mild thoracic spondylosis. CT ABDOMEN PELVIS FINDINGS Hepatobiliary: Numerous (at least 10) hypoenhancing liver masses scattered throughout the liver, increased in size and number since 04/12/2018 CT. Representative posterior right liver lobe 3.4 cm mass (series 2/image 58), increased from 2.3 cm. Representative superior right liver lobe 2.5 cm mass (series 2/image 48), increased from 1.9 cm. Representative inferior left  liver lobe 2.7 cm mass (series 2/image 65), increased from 1.6 cm. Well-positioned CBD stent with the lower tip abutting the duodenal lumen at the ampulla. Decreased intrahepatic biliary ductal dilatation with significantly increased pneumobilia. Pancreas: No discrete pancreatic mass. Stable top-normal caliber main pancreatic duct. Spleen: Normal size. No mass. Adrenals/Urinary Tract: Left adrenal 2.0 cm nodule (series 2/image 61), increased from 1.5 cm. No discrete right adrenal nodule. Normal kidneys with no hydronephrosis and no renal  mass. Normal bladder. Stomach/Bowel: Normal non-distended stomach. Normal caliber small bowel with no small bowel wall thickening. Normal appendix. Moderate colonic stool. No large bowel wall thickening or acute pericolonic fat stranding. Vascular/Lymphatic: Atherosclerotic nonaneurysmal abdominal aorta. Patent portal, splenic, hepatic and renal veins. Enlarged 1.5 cm porta hepatis node (series 2/image 62), increased from 1.1 cm. No additional pathologically enlarged lymph nodes in the abdomen or pelvis. Reproductive: Grossly normal uterus.  No adnexal mass. Other: No pneumoperitoneum. No focal fluid collection. Trace pelvic ascites. Stable subcutaneous 1.6 cm cystic lesion in the ventral right pelvic wall (series 2/image 111), probably a sebaceous cyst. Musculoskeletal: Widespread patchy confluent sclerotic osseous metastases throughout the lumbar spine and bilateral pelvic girdle, not appreciably changed since 04/12/2018 CT. Stable pathologic L2 vertebral compression fracture with stable 5 mm retropulsion of L2 posterior vertebral osseous fragments. IMPRESSION: 1. Numerous liver metastases, increased in size and number since 04/12/2018 CT. 2. 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 3. Porta hepatis adenopathy is mildly increased and suspicious for nodal metastasis. 4. Right infrahilar infiltrative lung mass is mildly increased in size. 5. Widespread patchy confluent sclerotic osseous metastatic disease throughout the axial and proximal appendicular skeleton, not appreciably changed since 04/12/2018 CT abdomen/pelvis study, clearly progressed since 12/19/2017 chest CT. Stable L2 vertebral pathologic fracture. 6. Decreased intrahepatic biliary ductal dilatation with improved pneumobilia status post CBD stent revision. 7. Trace pericardial effusion, slightly increased. Trace right pleural effusion, stable. Trace pelvic ascites. 8. Aortic Atherosclerosis (ICD10-I70.0) and Emphysema (ICD10-J43.9). Electronically  Signed   By: Ilona Sorrel M.D.   On: 05/02/2018 20:50   Ct Abdomen Pelvis W Contrast  Result Date: 04/12/2018 CLINICAL DATA:  Liver cancer abdominal pain EXAM: CT ABDOMEN AND PELVIS WITH CONTRAST TECHNIQUE: Multidetector CT imaging of the abdomen and pelvis was performed using the standard protocol following bolus administration of intravenous contrast. CONTRAST:  49mL ISOVUE-300 IOPAMIDOL (ISOVUE-300) INJECTION 61% COMPARISON:  MRI 02/25/2018, PET-CT 12/26/2017, CT 05/23/2017 FINDINGS: Lower chest: Lung bases demonstrate tiny pleural effusions. Passive atelectasis in the posterior left lung base. Persistent collapse in the right lower lobe with occlusion of the bronchus as before. Heart size within normal limits. Coronary vascular calcification. Hepatobiliary: Increased intra hepatic biliary dilatation since prior study. Increased size of hypodense metastatic liver lesions. Index posterior right hepatic lobe lesion now measures 23 mm compared with 10 mm previously. A left lobe lesion measures 16 mm, compared with 9 mm previously. Decreased pneumobilia with only minimal foci of pneumobilia evident. No air visible within the biliary stent except at its duodenal termination. Minimal wall thickening of the gallbladder. No calcified stones. Pancreas: No inflammatory changes.  No measurable mass. Spleen: Normal in size without focal abnormality. Adrenals/Urinary Tract: Right adrenal gland is within normal limits. Subtle increased nodularity of the left adrenal gland no hydronephrosis. The bladder is distended Stomach/Bowel: The stomach is nonenlarged. No dilated small bowel. No colon wall thickening. Vascular/Lymphatic: Moderate to marked aortic atherosclerosis. No aneurysmal dilatation. No definitive adenopathy. Reproductive: Uterus and bilateral adnexa are unremarkable. Other: Negative for free air. Tiny  amount of free fluid in the pelvis. Musculoskeletal: Extensive skeletal metastatic disease. Vertically oriented  fracture through L2 with retropulsion and moderate canal stenosis as before. IMPRESSION: 1. Biliary stent in place. Overall decreased pneumobilia since prior CT with minimal foci of gas present. Interval increase in intra hepatic biliary dilatation; constellation of findings is concerning for stent occlusion. 2. Increased size of hepatic metastatic lesions consistent with disease progression. 3. Right infrahilar soft tissue density/presumed mass and collapse of the right lower lobe as before. Trace pleural effusion 4. Subtle increased nodularity of the left adrenal gland is also concerning for metastatic disease. 5. Widespread skeletal metastatic disease with pathologic L2 fracture, retropulsion, and canal stenosis as noted on the prior exams. Electronically Signed   By: Donavan Foil M.D.   On: 04/12/2018 23:40   Dg Chest Port 1 View  Result Date: 04/12/2018 CLINICAL DATA:  Fever and cough EXAM: PORTABLE CHEST 1 VIEW COMPARISON:  03/01/2018, 12/19/2017 FINDINGS: Right-sided central venous port tip overlies the cavoatrial region. No acute airspace disease or effusion. Stable cardiomediastinal silhouette. No pneumothorax. Minimal atelectasis or scar at the left base. IMPRESSION: No active disease. Electronically Signed   By: Donavan Foil M.D.   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 Imaging suggestion of potential relative obstruction of the bile duct stent. EXAM: ERCP TECHNIQUE: Multiple spot images obtained with the fluoroscopic device and submitted for interpretation post-procedure. COMPARISON:  CT of the abdomen on 04/12/2018 FINDINGS: C-arm imaging during the endoscopic procedure demonstrates balloon dilatation of the indwelling metallic common bile duct stent. During the procedure there appears to be placement of an overlapping metallic  stent. Occlusion cholangiogram shows opacification of the biliary tree proximal to the stented segment. IMPRESSION: Imaging obtained during balloon dilatation of an indwelling metallic common bile duct stent and placement of new overlapping metallic biliary stent. These images were submitted for radiologic interpretation only. Please see the procedural report for the amount of contrast and the fluoroscopy time utilized. Electronically Signed   By: Aletta Edouard M.D.   On: 04/15/2018 07:56   [place] Ekg Site Rite  Result Date: 04/17/2018 If Site Rite image not attached, placement could not be confirmed due to current cardiac rhythm.   Discharge Exam: Vitals:   05/04/18 2029 05/05/18 0445  BP: 140/76 (!) 147/80  Pulse: 65 82  Resp: 18 18  Temp: 98.1 F (36.7 C) 99.4 F (37.4 C)  SpO2: 96% 95%   Vitals:   05/04/18 1415 05/04/18 1843 05/04/18 2029 05/05/18 0445  BP: 121/77 (!) 145/72 140/76 (!) 147/80  Pulse: 67 72 65 82  Resp: 14 20 18 18   Temp: 98.3 F (36.8 C)  98.1 F (36.7 C) 99.4 F (37.4 C)  TempSrc: Oral  Oral Oral  SpO2: 95% 98% 96% 95%  Weight:      Height:        General: Pt is alert, awake, not in acute distress Cardiovascular: RRR, S1/S2 +, no rubs, no gallops Respiratory: CTA bilaterally, no wheezing, no rhonchi Abdominal: Soft, NT, ND, bowel sounds + Extremities: no edema, no cyanosis   The results of significant diagnostics from this hospitalization (including imaging, microbiology, ancillary and laboratory) are listed below for reference.     Microbiology: No results found for this or any previous visit (from the past 240 hour(s)).   Labs: BNP (last  3 results) No results for input(s): BNP in the last 8760 hours. Basic Metabolic Panel: Recent Labs  Lab 05/01/18 0949  05/02/18 0404  05/03/18 0343 05/03/18 1557 05/04/18 0348 05/04/18 1551 05/05/18 0336  NA 116*   < > 120*   < > 124* 122* 127* 128* 130*  K 4.1   < > 2.8*   < > 2.8* 4.5 3.4* 3.5 2.6*   CL 84*   < > 86*   < > 91* 92* 94* 96* 95*  CO2 23   < > 23   < > 23 22 24 23 25   GLUCOSE 90   < > 75   < > 75 166* 82 150* 74  BUN 6*   < > <5*   < > <5* <5* <5* <5* <5*  CREATININE 0.55   < > <0.30*   < > 0.31* 0.38* 0.33* 0.42* <0.30*  CALCIUM 8.8*   < > 7.5*   < > 7.5* 8.2* 7.7* 7.6* 8.0*  MG 1.6*  --  1.9  --  1.8  --   --   --  1.8  PHOS 3.4  --   --   --   --   --   --   --   --    < > = values in this interval not displayed.   Liver Function Tests: Recent Labs  Lab 04/30/18 0936  AST 23  ALT 24  ALKPHOS 187*  BILITOT 0.9  PROT 6.9  ALBUMIN 3.3*   No results for input(s): LIPASE, AMYLASE in the last 168 hours. No results for input(s): AMMONIA in the last 168 hours. CBC: Recent Labs  Lab 04/30/18 0936 05/01/18 0949 05/02/18 0404  WBC 6.6 7.4 6.5  NEUTROABS 4.7 6.2  --   HGB 11.8 11.1* 10.2*  HCT 35.2 32.8* 29.0*  MCV 84.5 83.9 81.5  PLT 415* 374 354   Cardiac Enzymes: No results for input(s): CKTOTAL, CKMB, CKMBINDEX, TROPONINI in the last 168 hours. BNP: Invalid input(s): POCBNP CBG: No results for input(s): GLUCAP in the last 168 hours. D-Dimer No results for input(s): DDIMER in the last 72 hours. Hgb A1c No results for input(s): HGBA1C in the last 72 hours. Lipid Profile No results for input(s): CHOL, HDL, LDLCALC, TRIG, CHOLHDL, LDLDIRECT in the last 72 hours. Thyroid function studies No results for input(s): TSH, T4TOTAL, T3FREE, THYROIDAB in the last 72 hours.  Invalid input(s): FREET3 Anemia work up No results for input(s): VITAMINB12, FOLATE, FERRITIN, TIBC, IRON, RETICCTPCT in the last 72 hours. Urinalysis    Component Value Date/Time   COLORURINE YELLOW 04/23/2018 1001   APPEARANCEUR CLOUDY (A) 04/23/2018 1001   LABSPEC 1.034 04/23/2018 1001   PHURINE 5.5 04/23/2018 1001   GLUCOSEU NEGATIVE 04/23/2018 1001   HGBUR TRACE (A) 04/23/2018 1001   BILIRUBINUR NEGATIVE 04/12/2018 2217   KETONESUR 1+ (A) 04/23/2018 1001   PROTEINUR 2+ (A)  04/23/2018 1001   NITRITE NEGATIVE 04/23/2018 1001   LEUKOCYTESUR TRACE (A) 04/23/2018 1001   Sepsis Labs Invalid input(s): PROCALCITONIN,  WBC,  LACTICIDVEN Microbiology No results found for this or any previous visit (from the past 240 hour(s)).  Time coordinating discharge: 32 minutes  SIGNED:  Chipper Oman, MD  [facility] 05/05/2018, 12:49 PM  Pager please text page via  [email or web address]  Note - This record has been created using [employer]. Chart creation errors have been sought, but may not always have been located. Such creation errors do not reflect on the standard  of medical care.

## 2018-05-05 NOTE — Progress Notes (Signed)
CRITICAL VALUE ALERT  Critical Value:  Potassium 2.6  Date & Time Notied:  05-05-18 0531  Provider Notified: [place]  Orders Received/Actions taken:

## 2018-05-05 NOTE — Progress Notes (Signed)
Pt discharged to home with son. Left PICC in place upon discharge.  Pt and son verbalized understanding of discharge instructions and follow up care.  All belongings sent home with pt.  Education provided re: new medications, PICC line care, when to call MD and follow up care.  Danton Clap, RN

## 2018-05-05 NOTE — Care Management Note (Signed)
Case Management Note  Patient Details  Name: Haley [name] MRN: [id number] Date of Birth: [birth year]  Subjective/Objective: patient readmit. recc HHC-HHRN/aide,social worker-patient used [facility] in past will use again-rep Santiago Glad aware of d/c & HHC. No further CM needs.                   Action/Plan:d/c home w/HHC.   Expected Discharge Date:                  Expected Discharge Plan:  Home/Self Care  In-House Referral:     Discharge planning Services  CM Consult  Post Acute Care Choice:    Choice offered to:  Patient  DME Arranged:    DME Agency:     HH Arranged:  RN, Nurse's Aide, Social Work [employer] Agency:  [facility]  Status of Service:  Completed, signed off  If discussed at [employer], dates discussed:    Additional Comments:  Dessa Phi, RN 05/05/2018, 11:53 AM

## 2018-05-05 NOTE — Care Management Important Message (Signed)
Important Message  Patient Details  Name: Haley [name] MRN: [id number] Date of Birth: [birth year]   Medicare Important Message Given:  Yes    Kerin Salen 05/05/2018, 10:43 AMImportant Message  Patient Details  Name: Haley [name] MRN: [id number] Date of Birth: 1950/09/13   Medicare Important Message Given:  Yes    Kerin Salen 05/05/2018, 10:43 AM

## 2018-05-06 NOTE — Telephone Encounter (Signed)
I called [facility] already to pull picc line

## 2018-05-07 ENCOUNTER — Telehealth: Payer: Self-pay | Admitting: *Deleted

## 2018-05-07 NOTE — Telephone Encounter (Signed)
-----   Message from Alycia Rossetti, MD sent at 05/05/2018  8:03 PM EDT ----- Regarding: Marinol   Seems hospital put Haley [name] on Marinol, I do not prescribe this, needs oncology to prescribe and likley PA as this is medical cannaboid derivative. If you get any PA request we need to call her oncologist office

## 2018-05-08 ENCOUNTER — Encounter: Payer: Self-pay | Admitting: Endocrinology

## 2018-05-08 ENCOUNTER — Telehealth: Payer: Self-pay | Admitting: Family Medicine

## 2018-05-08 ENCOUNTER — Ambulatory Visit (INDEPENDENT_AMBULATORY_CARE_PROVIDER_SITE_OTHER): Payer: Medicare Other | Admitting: Endocrinology

## 2018-05-08 VITALS — BP 110/62 | HR 84 | Ht 63.0 in | Wt 117.8 lb

## 2018-05-08 DIAGNOSIS — E871 Hypo-osmolality and hyponatremia: Secondary | ICD-10-CM

## 2018-05-08 LAB — BASIC METABOLIC PANEL
BUN: 8 mg/dL (ref 6–23)
CO2: 25 meq/L (ref 19–32)
Calcium: 9 mg/dL (ref 8.4–10.5)
Chloride: 95 mEq/L — ABNORMAL LOW (ref 96–112)
Creatinine, Ser: 0.45 mg/dL (ref 0.40–1.20)
GFR: 148.28 mL/min (ref >60.00)
Glucose, Bld: 124 mg/dL — ABNORMAL HIGH (ref 70–99)
POTASSIUM: 5 meq/L (ref 3.5–5.1)
SODIUM: 128 meq/L — AB (ref 135–145)

## 2018-05-08 MED ORDER — SODIUM CHLORIDE 1 G PO TABS: 5.0000 g | ORAL_TABLET | Freq: Two times a day (BID) | ORAL | 5 refills | Status: DC

## 2018-05-08 NOTE — Patient Instructions (Signed)
Given your other health problems, it is not worth tapering off the hydrocortisone (which you would need to do in order to test the adrenal glands).  blood tests are requested for you today.  We'll let you know about the results.  The nodule in the adrenal is unlikely to be a problem from a hormonal standpoint, so you don't need hormonal testing for that.   Please come back for a follow-up appointment in 1 month.

## 2018-05-08 NOTE — Progress Notes (Signed)
Subjective:    Patient ID: Haley [name], female    DOB: 10/03/1950, 67 y.o.   MRN: [id number]  HPI Pt is referred by Dr Buelah Manis, for adrenal disorder.  She received chemotx for lung cancer in early 2019.  She has been found to have hyponatremia.  She has been on cortef x 3 months, and decadron prior to that.  No h/o thyroid problems, seizures, hypoglycemia, amyloidosis, tuberculosis, or diabetes.  Ho h/o ketoconazole, rifampin, or dilantin.  She reports moderate bloating of the abdomen after eating, and assoc decreased appetite.  She has been on NaCl (2g TID), and KCl (40/d) x a few weeks.   Past Medical History:  Diagnosis Date  . Allergy   . Back pain   . Osteopenia   . 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 Tubular adenoma of colon 10/26/2014    Past Surgical History:  Procedure Laterality Date  . BILIARY STENT PLACEMENT N/A 04/14/2018   Procedure: BILIARY STENT PLACEMENT;  Surgeon: [name], MD;  Location: [facility];  Service: Gastroenterology;  Laterality: N/A;  . COLONOSCOPY    . COLONOSCOPY WITH PROPOFOL N/A 10/25/2014   Procedure: COLONOSCOPY WITH PROPOFOL;  Surgeon: Garlan Fair, MD;  Location: [facility];  Service: Endoscopy;  Laterality: N/A;  . ERCP N/A 10/15/2017   Procedure: ENDOSCOPIC RETROGRADE CHOLANGIOPANCREATOGRAPHY (ERCP);  Surgeon: Clarene Essex, MD;  Location: [facility];  Service: Endoscopy;  Laterality: N/A;  . ERCP N/A 04/14/2018   Procedure: ENDOSCOPIC RETROGRADE CHOLANGIOPANCREATOGRAPHY (ERCP);  Surgeon: [name], MD;  Location: [facility];  Service: Gastroenterology;  Laterality: N/A;  with stent change and Balloon Sweep  . IR FLUORO GUIDE PORT INSERTION RIGHT  11/10/2017  . IR RADIOLOGIST EVAL & MGMT  10/02/2017  . IR US GUIDE VASC ACCESS RIGHT  11/10/2017  . PORT-A-CATH REMOVAL N/A 04/16/2018   Procedure: REMOVAL PORT-A-CATH;  Surgeon: Clovis Riley, MD;  Location: [facility];  Service:  General;  Laterality: N/A;    Social History   Socioeconomic History  . Marital status: Single    Spouse name: Not on file  . Number of children: Not on file  . Years of education: Not on file  . Highest education level: Not on file  Occupational History  . Not on file  Social Needs  . Financial resource strain: Not on file  . Food insecurity:    Worry: Not on file    Inability: Not on file  . Transportation needs:    Medical: Not on file    Non-medical: Not on file  Tobacco Use  . Smoking status: Former Smoker    Packs/day: 1.00    Years: 40.00    Pack years: 40.00    Types: Cigarettes    Last attempt to quit: 09/24/2017    Years since quitting: 0.6  . Smokeless tobacco: Never Used  Substance and Sexual Activity  . Alcohol use: Yes    Comment: rare social  . Drug use: No  . Sexual activity: Not Currently  Lifestyle  . Physical activity:    Days per week: Not on file    Minutes per session: Not on file  . Stress: Not on file  Relationships  . Social connections:    Talks on phone: Not on file    Gets together: Not on file    Attends religious service: Not on file    Active member of club or organization: Not on  file    Attends meetings of clubs or organizations: Not on file    Relationship status: Not on file  . Intimate partner violence:    Fear of current or ex partner: Not on file    Emotionally abused: Not on file    Physically abused: Not on file    Forced sexual activity: Not on file  Other Topics Concern  . Not on file  Social History Narrative  . Not on file    Current Outpatient Medications on File Prior to Visit  Medication Sig Dispense Refill  . calcium carbonate (OS-CAL - DOSED IN MG OF ELEMENTAL CALCIUM) 1250 (500 Ca) MG tablet Take 1 tablet by mouth daily.     . cholecalciferol (VITAMIN D) 1000 units tablet Take 1,000 Units by mouth daily.    Marland Kitchen dronabinol (MARINOL) 2.5 MG capsule Take 1 capsule (2.5 mg total) by mouth 2 (two) times daily  before lunch and supper. 60 capsule 0  . famotidine (PEPCID AC) 10 MG chewable tablet Chew 10 mg by mouth 2 (two) times daily.    . hydrocortisone (CORTEF) 10 MG tablet Take 2 tablets (20 mg total) by mouth daily with breakfast. And 10mg  po daily with lunch. (Patient taking differently: Take 10-20 mg by mouth See admin instructions. 20 mg every morning And 10mg  with lunch.) 90 tablet 1  . lidocaine-prilocaine (EMLA) cream Apply to affected area once 30 g 3  . metoCLOPramide (REGLAN) 10 MG tablet Take 1 tablet (10 mg total) by mouth every 8 (eight) hours as needed for nausea. 45 tablet 1  . Multiple Vitamins-Minerals (MULTIVITAMIN WITH MINERALS) tablet Take 1 tablet by mouth daily.    . ondansetron (ZOFRAN) 4 MG tablet Take 1 tablet (4 mg total) by mouth every 8 (eight) hours as needed for nausea or vomiting. 20 tablet 0  . polyethylene glycol (MIRALAX) packet Take 17 g by mouth daily as needed for moderate constipation. 30 each 0  . prochlorperazine (COMPAZINE) 10 MG tablet Take 1 tablet (10 mg total) by mouth every 6 (six) hours as needed (Nausea or vomiting). 30 tablet 1  . senna-docusate (SENNA S) 8.6-50 MG tablet Take 2 tablets by mouth at bedtime. (Patient taking differently: Take 2 tablets by mouth at bedtime as needed for mild constipation or moderate constipation. ) 60 tablet 1  . traMADol (ULTRAM) 50 MG tablet Take 50 mg by mouth every 6 (six) hours as needed (pain.).     Current Facility-Administered Medications on File Prior to Visit  Medication Dose Route Frequency Provider Last Rate Last Dose  . acetaminophen (TYLENOL) tablet 650 mg  650 mg Oral Once Curt Bears, MD      . diphenhydrAMINE (BENADRYL) capsule 25 mg  25 mg Oral Once Curt Bears, MD        No Known Allergies  Family History  Problem Relation Age of Onset  . Diabetes Mother   . Hypertension Mother   . Diabetes Sister   . Hypertension Sister   . Hyperlipidemia Sister   . Adrenal disorder Neg Hx     BP  110/62   Pulse 84   Ht 5\' 3"  (1.6 m)   Wt 117 lb 12.8 oz (53.4 kg)   SpO2 97%   BMI 20.87 kg/m     Review of Systems Denies fever, fatigue, headache, sob, palpitations, blurry vision, n/v, abd pain, seizure, diarrhea, excessive sweating, or change in skin tone.  She has lost 18 lbs in 2019.  She has lightheadedness, anxiety,  cold intolerance, easy bruising, muscle weakness, and difficulty with concentration.       Objective:   Physical Exam VS: see vs page GEN: no distress HEAD: head: no deformity eyes: no periorbital swelling, no proptosis external nose and ears are normal mouth: no lesion seen NECK: supple, thyroid is not enlarged CHEST WALL: no deformity LUNGS: clear to auscultation CV: reg rate and rhythm, no murmur ABD: abdomen is soft, nontender.  no hepatosplenomegaly.  not distended.  no hernia MUSCULOSKELETAL: muscle bulk and strength are grossly normal.  no obvious joint swelling.  gait is normal and steady EXTEMITIES: no deformity.  no ulcer on the feet.  feet are of normal color and temp.  no edema PULSES: dorsalis pedis intact bilat.  no carotid bruit NEURO:  cn 2-12 grossly intact.   readily moves all 4's.  sensation is intact to touch on the feet SKIN:  Normal texture and temperature.  No rash or suspicious lesion is visible.   NODES:  None palpable at the neck PSYCH: alert, well-oriented.  Does not appear anxious nor depressed.  I have reviewed outside records, and summarized: Pt was noted to have hyponatremia, and referred here.  She was seen for hyponatremia and dehydration.    CT: Left adrenal 2.0 cm nodule increased from 1.5 cm. No discrete right adrenal nodule. .  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    Patient Instructions  Given your other health problems, it is not worth tapering off the hydrocortisone (which you would need to do in order to test the adrenal glands).  blood tests are requested for you today.  We'll let you know about the results.  The nodule in the adrenal is unlikely to be a problem from a hormonal standpoint, so you don't need hormonal testing for that.   Please come back for a follow-up appointment in 1 month.

## 2018-05-08 NOTE — Telephone Encounter (Signed)
[facility] from [facility] calling to get verbal orders on this patient  Please call her at [phone]

## 2018-05-08 NOTE — Telephone Encounter (Signed)
Per PCP, all HH orders to be given from Oncology.  Call placed to [place] and she was made aware.

## 2018-05-11 ENCOUNTER — Telehealth: Payer: Self-pay | Admitting: Endocrinology

## 2018-05-11 ENCOUNTER — Other Ambulatory Visit: Payer: Self-pay | Admitting: Hematology

## 2018-05-11 ENCOUNTER — Other Ambulatory Visit: Payer: Self-pay

## 2018-05-11 DIAGNOSIS — E871 Hypo-osmolality and hyponatremia: Secondary | ICD-10-CM

## 2018-05-11 MED ORDER — SODIUM CHLORIDE 1 G PO TABS: 5.0000 g | ORAL_TABLET | Freq: Two times a day (BID) | ORAL | 5 refills | Status: DC

## 2018-05-11 MED ORDER — OXYCODONE HCL 5 MG PO TABS: 5.0000 mg | ORAL_TABLET | ORAL | 0 refills | Status: DC | PRN

## 2018-05-11 NOTE — Telephone Encounter (Signed)
Rx has been sent  

## 2018-05-11 NOTE — Telephone Encounter (Signed)
Patient needs RX for sodium chloride 1 g tablet (patient needs  dosage adjusted up-she said she takes 5 tablets x 3 times per day) sent to [facility]  on [address] in [place] (she was told that was done during last week's appt with Dr. Loanne Drilling but Pharmacy said they have not received it). Patient is out of medication.

## 2018-05-12 ENCOUNTER — Telehealth: Payer: Self-pay

## 2018-05-12 NOTE — Telephone Encounter (Signed)
Received a call from patient's son Randall Hiss requesting follow up visit with Dr. Irene Limbo post hospitalization. Per Dr. Irene Limbo he can see patient in the office on Friday 05/15/18 at 12:00. Eric verbalized understanding of appointment day and time.

## 2018-05-14 NOTE — Progress Notes (Signed)
HEMATOLOGY/ONCOLOGY CLINIC NOTE  Date of Service: 05/15/18  Patient Care Team: [facility], Modena Nunnery, MD as PCP - General (Family Medicine)  CHIEF COMPLAINTS/PURPOSE OF CONSULTATION:   F/u for continued management of extensive stage small cell lung cancer. hyponatremia  HISTORY OF PRESENTING ILLNESS:   Haley [name] is a wonderful 67 y.o. female who has been referred to [place] by Orthopedists Dr. Vickki Hearing for evaluation and management of Bone Marrow Abnormality. Her PCP is Dr. Buelah Manis.   She presents to the clinic today accompanied by her sons and her daughter-in-law. She notes she has several issues since October 2018.  She had colitis in 05/2017 with significant diarrhea and abdominal cramping --now mostly resolved.  She was treated with antibiotics. Afterward she had bronchitis. She was given albuterol and continues to take it as she has remaining cough (smoking contributed to this).   On 08/07/17 she notes she has back pain in her lower back the past couple of months. 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 Next day she saw her physician, who had done a xray and told her she had arthritis. She was treated with prednisone and gabapentin. After no back pain improvements on treatment, she has yet to return to work. She went to orthopedist and had a Lumbar MRI on 09/25/17. This shows a compression fracture of the L2 with abnormalities in her bone marrow.   She quit smoking 09/24/17. She had been smoking 1 pack a day for 50 years, since she was 67 yo. She still has cough from smoking but not as significant.   She denies heart conditions or disease, GI issues or diseases or other significant medical issues. DM runs in her family but no known cancers, blood disorders, or gene mutations. Prior to her health in the past 6 months she has lead a independent overall healthy life. Her last mammogram in 04/2017 was negative. Last colonoscopy in 2015 and last [place] in 2017,  were negative. She had a bone density scan in 2009. She has never been on HR therapy.   Given her significant pain (up to 10/10). She currently takes hydrocodone 1 tablet q4-5hours. This only reduced her pain to 7-8/10. She takes Tizanidine 22m q5-6hours. She has a back brace that she does not use.   On review of symptoms, pt notes ongoing b/l hip pain, lower back pain which can radiate up the side and down her legs and buttocks. The pain can keep her up at night. She notes when she lays on her right side, her throat pain which causes her to cough. This started around time of bronchitis and has not improved. When she does cough she produces clear phlegm.  Interval History:   Haley [name] here for a scheduled follow-up of her extensive stage metastatic small cell lung cancer. I last saw the pt in her last hospital admission on 05/03/18. She is accompanied today by both of her sons.   The pt reports that she has been doing her best to eat better. She saw Dr. ELoanne Drillingin endocrinology and was recommended to take 10 sodium pills. She notes that she continues with the same hydrocortisone dose.  She notes that she has fallen out of bed during the night and has felt "loopy," requiring her sons to help her back into bed. This has happened on four occasions so far and notes that she wakes up feeling very confused and disoriented. She notes that she hasn't had any episodes  like this during the day. She currently lives with her sons and has someone with her around the clock. She remains able to walk under her own strength and is able to complete her activities of daily living.   The pt notes that she doesn't have any appointments with Radiation Oncology.   On review of systems, pt reports eating better, some episodes of confusion at night, stable weight, and denies SOB, CP, abdominal pains, and any other symptoms.    MEDICAL HISTORY:  Past Medical History:  Diagnosis Date  . Allergy   . Back pain   .  Osteopenia   . Small cell lung cancer ([place]) 10/17/2017   Extended stage with metastases to liver and bone  . Tubular adenoma of colon 10/26/2014    SURGICAL HISTORY: Past Surgical History:  Procedure Laterality Date  . BILIARY STENT PLACEMENT N/A 04/14/2018   Procedure: BILIARY STENT PLACEMENT;  Surgeon: [name], MD;  Location: [facility];  Service: Gastroenterology;  Laterality: N/A;  . COLONOSCOPY    . COLONOSCOPY WITH PROPOFOL N/A 10/25/2014   Procedure: COLONOSCOPY WITH PROPOFOL;  Surgeon: Garlan Fair, MD;  Location: [facility];  Service: Endoscopy;  Laterality: N/A;  . ERCP N/A 10/15/2017   Procedure: ENDOSCOPIC RETROGRADE CHOLANGIOPANCREATOGRAPHY (ERCP);  Surgeon: Clarene Essex, MD;  Location: [facility];  Service: Endoscopy;  Laterality: N/A;  . ERCP N/A 04/14/2018   Procedure: ENDOSCOPIC RETROGRADE CHOLANGIOPANCREATOGRAPHY (ERCP);  Surgeon: [name], MD;  Location: [facility];  Service: Gastroenterology;  Laterality: N/A;  with stent change and Balloon Sweep  . IR FLUORO GUIDE PORT INSERTION RIGHT  11/10/2017  . IR RADIOLOGIST EVAL & MGMT  10/02/2017  . IR US GUIDE VASC ACCESS RIGHT  11/10/2017  . PORT-A-CATH REMOVAL N/A 04/16/2018   Procedure: REMOVAL PORT-A-CATH;  Surgeon: Clovis Riley, MD;  Location: [facility];  Service: General;  Laterality: N/A;    SOCIAL HISTORY: Social History   Socioeconomic History  . Marital status: Single    Spouse name: Not on [name]  . Number of children: Not on [name]  . Years of education: Not on [name]  . Highest education level: Not on [name]  Occupational History  . Not on [name]  Social Needs  . Financial resource strain: Not on [name]  . Food insecurity:    Worry: Not on [name]    Inability: Not on [name]  . Transportation needs:    Medical: Not on [name]    Non-medical: Not on [name]  Tobacco Use  . Smoking status: Former Smoker    Packs/day: 1.00    Years: 40.00    Pack years: 40.00    Types: Cigarettes    Last attempt to quit:  09/24/2017    Years since quitting: 0.6  . Smokeless tobacco: Never Used  Substance and Sexual Activity  . Alcohol use: Yes    Comment: rare social  . Drug use: No  . Sexual activity: Not Currently  Lifestyle  . Physical activity:    Days per week: Not on [name]    Minutes per session: Not on [name]  . Stress: Not on [name]  Relationships  . Social connections:    Talks on phone: Not on [name]    Gets together: Not on [name]    Attends religious service: Not on [name]    Active member of club or organization: Not on [name]    Attends meetings of clubs or organizations: Not on [name]    Relationship status: Not on [name]  .  Intimate partner violence:    Fear of current or ex partner: Not on [name]    Emotionally abused: Not on [name]    Physically abused: Not on [name]    Forced sexual activity: Not on [name]  Other Topics Concern  . Not on [name]  Social History Narrative  . Not on [name]    FAMILY HISTORY: Family History  Problem Relation Age of Onset  . Diabetes Mother   . Hypertension Mother   . Diabetes Sister   . Hypertension Sister   . Hyperlipidemia Sister   . Adrenal disorder Neg Hx     ALLERGIES:  has No Known Allergies.  MEDICATIONS:  Current Outpatient Medications  Medication Sig Dispense Refill  . calcium carbonate (OS-CAL - DOSED IN MG OF ELEMENTAL CALCIUM) 1250 (500 Ca) MG tablet Take 1 tablet by mouth daily.     . cholecalciferol (VITAMIN D) 1000 units tablet Take 1,000 Units by mouth daily.    Marland Kitchen dronabinol (MARINOL) 2.5 MG capsule Take 1 capsule (2.5 mg total) by mouth 2 (two) times daily before lunch and supper. 60 capsule 0  . famotidine (PEPCID AC) 10 MG chewable tablet Chew 10 mg by mouth 2 (two) times daily.    . hydrocortisone (CORTEF) 10 MG tablet Take 2 tablets (20 mg total) by mouth daily with breakfast. And 21m po daily with lunch. (Patient taking differently: Take 10-20 mg by mouth See admin instructions. 20 mg every morning And 12mwith lunch.) 90 tablet 1  .  lidocaine-prilocaine (EMLA) cream Apply to affected area once 30 g 3  . metoCLOPramide (REGLAN) 10 MG tablet Take 1 tablet (10 mg total) by mouth every 8 (eight) hours as needed for nausea. 45 tablet 1  . Multiple Vitamins-Minerals (MULTIVITAMIN WITH MINERALS) tablet Take 1 tablet by mouth daily.    . ondansetron (ZOFRAN) 4 MG tablet Take 1 tablet (4 mg total) by mouth every 8 (eight) hours as needed for nausea or vomiting. 20 tablet 0  . oxyCODONE (OXY IR/ROXICODONE) 5 MG immediate release tablet Take 1-2 tablets (5-10 mg total) by mouth every 4 (four) hours as needed for moderate pain or severe pain. 50 tablet 0  . polyethylene glycol (MIRALAX) packet Take 17 g by mouth daily as needed for moderate constipation. 30 each 0  . prochlorperazine (COMPAZINE) 10 MG tablet Take 1 tablet (10 mg total) by mouth every 6 (six) hours as needed (Nausea or vomiting). 30 tablet 1  . senna-docusate (SENNA S) 8.6-50 MG tablet Take 2 tablets by mouth at bedtime. (Patient taking differently: Take 2 tablets by mouth at bedtime as needed for mild constipation or moderate constipation. ) 60 tablet 1  . sodium chloride 1 g tablet Take 5 tablets (5 g total) by mouth 2 (two) times daily with a meal. 150 tablet 5   No current facility-administered medications for this visit.    Facility-Administered Medications Ordered in Other Visits  Medication Dose Route Frequency Provider Last Rate Last Dose  . acetaminophen (TYLENOL) tablet 650 mg  650 mg Oral Once MoCurt BearsMD      . diphenhydrAMINE (BENADRYL) capsule 25 mg  25 mg Oral Once MoCurt BearsMD        REVIEW OF SYSTEMS:    A 10+ POINT REVIEW OF SYSTEMS WAS OBTAINED including neurology, dermatology, psychiatry, cardiac, respiratory, lymph, extremities, GI, GU, Musculoskeletal, constitutional, breasts, reproductive, HEENT.  All pertinent positives are noted in the HPI.  All others are negative.   PHYSICAL EXAMINATION: ECOG  PERFORMANCE STATUS: 2 -  Symptomatic, <50% confined to bed . Vitals:   05/15/18 1253  BP: 122/84  Pulse: 80  Resp: 18  Temp: 98.3 F (36.8 C)  SpO2: 96%   Filed Weights   05/15/18 1253  Weight: 116 lb 8 oz (52.8 kg)   .Body mass index is 20.64 kg/m.  GENERAL:alert, in no acute distress and comfortable SKIN: no acute rashes, no significant lesions EYES: conjunctiva are pink and non-injected, sclera anicteric OROPHARYNX: MMM, no exudates, no oropharyngeal erythema or ulceration NECK: supple, no JVD LYMPH:  no palpable lymphadenopathy in the cervical, axillary or inguinal regions LUNGS: clear to auscultation b/l with normal respiratory effort HEART: regular rate & rhythm ABDOMEN:  normoactive bowel sounds , non tender, not distended. No palpable hepatosplenomegaly.  Extremity: no pedal edema PSYCH: alert & oriented x 3 with fluent speech NEURO: no focal motor/sensory deficits   LABORATORY DATA:   . CBC Latest Ref Rng & Units 05/02/2018 05/01/2018 04/30/2018  WBC 4.0 - 10.5 K/uL 6.5 7.4 6.6  Hemoglobin 12.0 - 15.0 g/dL 10.2(L) 11.1(L) 11.8  Hematocrit 36.0 - 46.0 % 29.0(L) 32.8(L) 35.2  Platelets 150 - 400 K/uL 354 374 415(H)  HGB 9.6 . CMP Latest Ref Rng & Units 05/08/2018 05/05/2018 05/04/2018  Glucose 70 - 99 mg/dL 124(H) 74 150(H)  BUN 6 - 23 mg/dL 8 <5(L) <5(L)  Creatinine 0.40 - 1.20 mg/dL 0.45 <0.30(L) 0.42(L)  Sodium 135 - 145 mEq/L 128(L) 130(L) 128(L)  Potassium 3.5 - 5.1 mEq/L 5.0 2.6(LL) 3.5  Chloride 96 - 112 mEq/L 95(L) 95(L) 96(L)  CO2 19 - 32 mEq/L 25 25 23   Calcium 8.4 - 10.5 mg/dL 9.0 8.0(L) 7.6(L)  Total Protein 6.5 - 8.1 g/dL - - -  Total Bilirubin 0.3 - 1.2 mg/dL - - -  Alkaline Phos 38 - 126 U/L - - -  AST 15 - 41 U/L - - -  ALT 0 - 44 U/L - - -       10/17/17 Liver needle/core biopsy   Fine Needle Aspiration, EUS, Pancreas3/12/2017 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  Specimen Adequacy:Satisfactory for evaluation.    COMMENT:The smears show clusters of neoplastic cells with neuroendocrine nuclear features and scant cytoplasm.  Immunoperoxidase studies on limited cell block material show the neoplastic cells stain positive for synaptophysin, CD56, cytokeratin AE1/AE3 (Golgi-dot like staining) and TTF-1. The MIB-1 shows a high proliferation index with positive staining in approximately 80% of the cells. Together with the cytomorphology this supports the diagnosis of small cell carcinoma. Given the presence of a suspected right infrahilar lung mass on MRI per [facility] and a pancreatic mass, I cannot be certain of the site of origin and whether this represents a primary pancreatic small cell poorly differentiated endocrine carcinoma versus a metastatic small cell carcinoma of lung origin (both can express TTF-1). Clinical correlation advised.  Dr. Romilda Garret has reviewed the case in consultation and agrees with the diagnosis of small cell carcinoma.   The positive controls worked appropriately   "These tests were developed and their performance characteristics determined by [facility].They have not been cleared or approved by the U.S. Food and Drug Administration.The FDA has determined that such clearance or approval  is not necessary.These tests are used for clinical purposes.They should not be regarded as investigational or for research.This laboratory is certified under the [place] (CLIA) as qualified to perform high complexity clinical laboratory testing."   Report Prepared  By:O.A. Sheryle Hail, M.D.  I have personally reviewed the slides and/or other related materials referenced, and have edited the report as part of my pathologic assessment and final interpretation.  Electronically Signed Out By: Blythe Stanford , MD 10/28/2017 20:55:03  sb/oah  Specimen(s) Received:  Pancreas, endoscopic ultrasound-guided, fine needle aspiration II (smears and cell block)  Clinical History obstructive jaundice  Preliminary (on-site) Interpretation  Adequate. L.[facility] CT(ASCP) 10/24/17     RADIOGRAPHIC STUDIES: I have personally reviewed the radiological images as listed and agreed with the findings in the report. Ct Chest W Contrast  Result Date: 05/02/2018 CLINICAL DATA:  Extensive stage small cell right lung cancer. Failure to thrive. Hyponatremia. Restaging. Inpatient. EXAM: CT CHEST, ABDOMEN, AND PELVIS WITH CONTRAST TECHNIQUE: Multidetector CT imaging of the chest, abdomen and pelvis was performed following the standard protocol during bolus administration of intravenous contrast. CONTRAST:  145m OMNIPAQUE IOHEXOL 300 MG/ML SOLN, 349mISOVUE-300 IOPAMIDOL (ISOVUE-300) INJECTION 61% COMPARISON:  04/12/2018 CT abdomen/pelvis and chest radiograph. 02/25/2018 MRI abdomen. 12/26/2017 PET-CT. 12/19/2017 chest CT. FINDINGS: CT CHEST FINDINGS Cardiovascular: Normal heart size. Trace pericardial effusion/thickening, slightly increased. Three-vessel coronary atherosclerosis. Left PICC terminates in the upper third of the SVC. Atherosclerotic nonaneurysmal thoracic aorta. Stable top-normal main pulmonary artery (3.1 cm diameter). No central pulmonary emboli. Mediastinum/Nodes: No discrete thyroid nodules. Unremarkable esophagus. No axillary adenopathy. No left hilar adenopathy. No new mediastinal adenopathy. Lungs/Pleura: No pneumothorax. Stable trace dependent right pleural effusion. No left pleural effusion. Severe centrilobular emphysema with diffuse bronchial wall thickening.  Infiltrative poorly marginated right infrahilar 4.0 x 2.7 cm lung mass (series 2/image 35), contiguous with right hilar and subcarinal adenopathy, mildly increased from 3.7 x 2.1 cm on 04/12/2018 CT abdomen study. Stable complete right lower lobe and near complete right middle lobe atelectasis. No new significant pulmonary nodules. Musculoskeletal: Widespread patchy confluent sclerotic osseous metastases throughout the thoracic spine, sternum, bilateral ribs and bilateral shoulder girdles, significantly increased in extent and sclerosis since 12/19/2017 chest CT, not appreciably changed in the lower thoracic spine since 04/12/2018 CT abdomen. Mild thoracic spondylosis. CT ABDOMEN PELVIS FINDINGS Hepatobiliary: Numerous (at least 10) hypoenhancing liver masses scattered throughout the liver, increased in size and number since 04/12/2018 CT. Representative posterior right liver lobe 3.4 cm mass (series 2/image 58), increased from 2.3 cm. Representative superior right liver lobe 2.5 cm mass (series 2/image 48), increased from 1.9 cm. Representative inferior left liver lobe 2.7 cm mass (series 2/image 65), increased from 1.6 cm. Well-positioned CBD stent with the lower tip abutting the duodenal lumen at the ampulla. Decreased intrahepatic biliary ductal dilatation with significantly increased pneumobilia. Pancreas: No discrete pancreatic mass. Stable top-normal caliber main pancreatic duct. Spleen: Normal size. No mass. Adrenals/Urinary Tract: Left adrenal 2.0 cm nodule (series 2/image 61), increased from 1.5 cm. No discrete right adrenal nodule. Normal kidneys with no hydronephrosis and no renal mass. Normal bladder. Stomach/Bowel: Normal non-distended stomach. Normal caliber small bowel with no small bowel wall thickening. Normal appendix. Moderate colonic stool. No large bowel wall thickening or acute pericolonic fat stranding. Vascular/Lymphatic: Atherosclerotic nonaneurysmal abdominal aorta. Patent portal, splenic,  hepatic and renal veins. Enlarged 1.5 cm porta hepatis node (series 2/image 62), increased from 1.1 cm. No additional pathologically enlarged lymph nodes in the abdomen or  pelvis. Reproductive: Grossly normal uterus.  No adnexal mass. Other: No pneumoperitoneum. No focal fluid collection. Trace pelvic ascites. Stable subcutaneous 1.6 cm cystic lesion in the ventral right pelvic wall (series 2/image 111), probably a sebaceous cyst. Musculoskeletal: Widespread patchy confluent sclerotic osseous metastases throughout the lumbar spine and bilateral pelvic girdle, not appreciably changed since 04/12/2018 CT. Stable pathologic L2 vertebral compression fracture with stable 5 mm retropulsion of L2 posterior vertebral osseous fragments. IMPRESSION: 1. Numerous liver metastases, increased in size and number since 04/12/2018 CT. 2. 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 3. Porta hepatis adenopathy is mildly increased and suspicious for nodal metastasis. 4. Right infrahilar infiltrative lung mass is mildly increased in size. 5. Widespread patchy confluent sclerotic osseous metastatic disease throughout the axial and proximal appendicular skeleton, not appreciably changed since 04/12/2018 CT abdomen/pelvis study, clearly progressed since 12/19/2017 chest CT. Stable L2 vertebral pathologic fracture. 6. Decreased intrahepatic biliary ductal dilatation with improved pneumobilia status post CBD stent revision. 7. Trace pericardial effusion, slightly increased. Trace right pleural effusion, stable. Trace pelvic ascites. 8. Aortic Atherosclerosis (ICD10-I70.0) and Emphysema (ICD10-J43.9). Electronically Signed   By: Ilona Sorrel M.D.   On: 05/02/2018 20:50   Mr Jeri Cos AQ Contrast  Result Date: 05/02/2018 CLINICAL DATA:  Small cell lung cancer. Status post whole-brain radiation. Headaches. New onset of weakness and dizziness. EXAM: MRI HEAD WITHOUT AND WITH CONTRAST TECHNIQUE: Multiplanar, multiecho pulse sequences of the  brain and surrounding structures were obtained without and with intravenous contrast. CONTRAST:  5 mL Gadavist COMPARISON:  MRI brain 01/12/2018. CT head without contrast 02/24/2018 and 04/12/2018. FINDINGS: Brain: Majority of lesions have decreased in size. There 2 new or enlarging lesions, each measuring 3 mm. A 3 mm lesion is present in the medial right temporal lobe. This may be artifact from adjacent vessel. A 3 mm cortical lesion is present in the right frontal lobe on image 43 of series 11. Lesion adjacent to the right lateral ventricle now measures 5 mm, stable to slightly decreased. A lesion along the splenium of the corpus callosum is decreased in size, now measuring 4 mm. No acute infarct or hemorrhage is present. At least 3 of the lesions demonstrate restricted diffusion, compatible with small cell lung cancer. Mild periventricular white matter changes have increased since the prior study, compatible with whole-brain radiation Vascular: Flow is present in the major intracranial arteries. Skull and upper cervical spine: The craniocervical junction is normal. Progressive sclerotic lesion is present at C2 without collapse. There is diffuse metastatic infiltration of the calvarium. Sinuses/Orbits: The paranasal sinuses are clear. There is fluid in the mastoid air cells bilaterally. No obstructing nasopharyngeal lesion is present. Globes and orbits are within normal limits. IMPRESSION: 1. Positive response to radiation therapy with interval decrease in size of most lesions. 2. New or increased size of 3 mm cortical lesion in the right frontal lobe on image 43 of series 11. 3. New 3 mm lesion along the medial right temporal lobe. This may be artifact. Please see the discussion above. 4. Increasing white matter disease likely reflects the sequela of chronic microvascular ischemia. 5. New osseous metastasis at C2 without pathologic fracture. 6. Diffuse osseous metastases throughout the calvarium. Electronically  Signed   By: San Morelle M.D.   On: 05/02/2018 16:36   Ct Abdomen Pelvis W Contrast  Result Date: 05/02/2018 CLINICAL DATA:  Extensive stage small cell right lung cancer. Failure to thrive. Hyponatremia. Restaging. Inpatient. EXAM: CT CHEST, ABDOMEN, AND PELVIS WITH CONTRAST TECHNIQUE:  Multidetector CT imaging of the chest, abdomen and pelvis was performed following the standard protocol during bolus administration of intravenous contrast. CONTRAST:  184m OMNIPAQUE IOHEXOL 300 MG/ML SOLN, 357mISOVUE-300 IOPAMIDOL (ISOVUE-300) INJECTION 61% COMPARISON:  04/12/2018 CT abdomen/pelvis and chest radiograph. 02/25/2018 MRI abdomen. 12/26/2017 PET-CT. 12/19/2017 chest CT. FINDINGS: CT CHEST FINDINGS Cardiovascular: Normal heart size. Trace pericardial effusion/thickening, slightly increased. Three-vessel coronary atherosclerosis. Left PICC terminates in the upper third of the SVC. Atherosclerotic nonaneurysmal thoracic aorta. Stable top-normal main pulmonary artery (3.1 cm diameter). No central pulmonary emboli. Mediastinum/Nodes: No discrete thyroid nodules. Unremarkable esophagus. No axillary adenopathy. No left hilar adenopathy. No new mediastinal adenopathy. Lungs/Pleura: No pneumothorax. Stable trace dependent right pleural effusion. No left pleural effusion. Severe centrilobular emphysema with diffuse bronchial wall thickening. Infiltrative poorly marginated right infrahilar 4.0 x 2.7 cm lung mass (series 2/image 35), contiguous with right hilar and subcarinal adenopathy, mildly increased from 3.7 x 2.1 cm on 04/12/2018 CT abdomen study. Stable complete right lower lobe and near complete right middle lobe atelectasis. No new significant pulmonary nodules. Musculoskeletal: Widespread patchy confluent sclerotic osseous metastases throughout the thoracic spine, sternum, bilateral ribs and bilateral shoulder girdles, significantly increased in extent and sclerosis since 12/19/2017 chest CT, not appreciably  changed in the lower thoracic spine since 04/12/2018 CT abdomen. Mild thoracic spondylosis. CT ABDOMEN PELVIS FINDINGS Hepatobiliary: Numerous (at least 10) hypoenhancing liver masses scattered throughout the liver, increased in size and number since 04/12/2018 CT. Representative posterior right liver lobe 3.4 cm mass (series 2/image 58), increased from 2.3 cm. Representative superior right liver lobe 2.5 cm mass (series 2/image 48), increased from 1.9 cm. Representative inferior left liver lobe 2.7 cm mass (series 2/image 65), increased from 1.6 cm. Well-positioned CBD stent with the lower tip abutting the duodenal lumen at the ampulla. Decreased intrahepatic biliary ductal dilatation with significantly increased pneumobilia. Pancreas: No discrete pancreatic mass. Stable top-normal caliber main pancreatic duct. Spleen: Normal size. No mass. Adrenals/Urinary Tract: Left adrenal 2.0 cm nodule (series 2/image 61), increased from 1.5 cm. No discrete right adrenal nodule. Normal kidneys with no hydronephrosis and no renal mass. Normal bladder. Stomach/Bowel: Normal non-distended stomach. Normal caliber small bowel with no small bowel wall thickening. Normal appendix. Moderate colonic stool. No large bowel wall thickening or acute pericolonic fat stranding. Vascular/Lymphatic: Atherosclerotic nonaneurysmal abdominal aorta. Patent portal, splenic, hepatic and renal veins. Enlarged 1.5 cm porta hepatis node (series 2/image 62), increased from 1.1 cm. No additional pathologically enlarged lymph nodes in the abdomen or pelvis. Reproductive: Grossly normal uterus.  No adnexal mass. Other: No pneumoperitoneum. No focal fluid collection. Trace pelvic ascites. Stable subcutaneous 1.6 cm cystic lesion in the ventral right pelvic wall (series 2/image 111), probably a sebaceous cyst. Musculoskeletal: Widespread patchy confluent sclerotic osseous metastases throughout the lumbar spine and bilateral pelvic girdle, not appreciably  changed since 04/12/2018 CT. Stable pathologic L2 vertebral compression fracture with stable 5 mm retropulsion of L2 posterior vertebral osseous fragments. IMPRESSION: 1. Numerous liver metastases, increased in size and number since 04/12/2018 CT. 2. 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 3. Porta hepatis adenopathy is mildly increased and suspicious for nodal metastasis. 4. Right infrahilar infiltrative lung mass is mildly increased in size. 5. Widespread patchy confluent sclerotic osseous metastatic disease throughout the axial and proximal appendicular skeleton, not appreciably changed since 04/12/2018 CT abdomen/pelvis study, clearly progressed since 12/19/2017 chest CT. Stable L2 vertebral pathologic fracture. 6. Decreased intrahepatic biliary ductal dilatation with improved pneumobilia status post CBD stent revision. 7. Trace pericardial  effusion, slightly increased. Trace right pleural effusion, stable. Trace pelvic ascites. 8. Aortic Atherosclerosis (ICD10-I70.0) and Emphysema (ICD10-J43.9). Electronically Signed   By: Ilona Sorrel M.D.   On: 05/02/2018 20:50   [place] Ekg Site Rite  Result Date: 04/17/2018 If Site Rite image not attached, placement could not be confirmed due to current cardiac rhythm.   MRI Lumbar Spine 09/25/17  IMPRESSION:  1. Extensive marrow signal abnormality throughout the osseous structures of the visualized thoracolumbar spine, sacrum, and iliac bones concerning for metastases, multiple myeloma, or other nonspecific marrow infiltrative disorder. Suggest obtaining a PET/CT to evaluate extent of disease/evaluate for a primary tumor.  2. L2 compression fracture, concerning for pathologic fracture given the extensive marrow abnormality with approximately 40% vertebral body height loss and 8.51m of retropulsion resulting in moderate narrowing of the spinal canal. A postcontrast MRI with fat saturation would help to evaluate the enhancements pattern of the bony disease and  evaluate for potential abnormal soft issue extending beyond the cortex which is not definitively evident in this exam.  3. Moderate facet arthrophy with grade 1 anterolisthesis at L4-5.  Degenerative disc disease with moderate left foraminal stenosis at L5-S1.    ASSESSMENT & PLAN:   SDORTHA NEIGHBORSis a 67y.o. caucasian female with   1. 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  Liver mets + Bone mets + adrenal mets Pancreatic mets- FNA confirmed MRI brain 10/29/2017 after this clinic visit showed concern for multiple asymptomatic subcentimeter brain mets. 11/28/17 Brain MRI which revealed stable lesions, and new nonenhancing lesions.  12/26/17 PET which revealed Marked improvement in the previously demonstrated widespread hypermetabolic metastatic disease. There is only mild residual hypermetabolic activity within some of the osseous lesions.   patient has completed 4 cycles of carboplatin/etoposide/Atezolizumab and completed -completed WBRT 30 Gy in 10 fractions 01/15/2018 - 01/29/2018  04/12/18 CT A/P revealed concern for progression of mets in liver  2. 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   Component     Latest Ref Rng & Units 02/09/2018  Cortisol, Plasma     ug/dL 0.4  Osmolality     275 - 295 mOsm/kg 264 (L)  Sodium, Urine     mmol/L 82  Osmolality, Urine     300 - 900 mOsm/kg 633  T4,Free(Direct)     0.82 - 1.77 ng/dL 0.95  TSH     0.308 - 3.960 uIU/mL 0.894    3. Hypokalemia. Has been a recurrent issue due to decreased po intake  PLAN: -Nutritional therapy consultation for optimizing p.o. Intake. -MRI brain -- most brain mets resolved. Couple of area uncertain for new changes. -CT chest/abd/pelvis with signs of SCLC progression in liver, abd LN, left  adrenal, lung and new C2 lesion. -Will order labs today -Discussed again the last CT imaging and MRI Brain with the pt and her sons  -Continue follow up endocrinology and Salt pills 10 times a day as recommended by endocrinology  -Discussed that I am concerned that her confusion, hyponatremia, metabolic abnormalities, and disease progression will contribute to her not tolerating second line chemotherapy well  -Discussed that the pt could choose to focus on comfort measures at any point in time -The pt prefers to begin Pembrolizumab at this time -I will also refer the pt back to Radiation Oncology -Will see the  pt back in 3 weeks    4.s/p obstructive Jaundice due to biliary compression from pancreatic mass  -ERCP attempted by Dr Watt Climes - not successful -ERCP completed on 10/24/17 with Dr. Harley Hallmark with much improved LFTs -Bx of  pancreatic lesion -consistent with small cell lung cancer.  Now with abnormal LFTs and concerns with stent blockages and possible biliary infection. Rpt ERCP and restenting 04/15/2018 - concern forA single localized biliary stricture was found in the lower third of the main bile duct. The stricture was malignant appearing secondary to tumor ingrowth. - The lower third of the main bile duct was dilatedOne partially covered metal stent was placed into the common bile duct. -improving LFTs post re stenting  5. Back pain related to pathologic L2 fracture (causing elevated alkaline phosphatase levels. -continue using back brace -will hold of on interventional procedure to L2 at this time. -Per patient's clinical presentation; her hip and leg pain is likely nerve pinching due to compression fractures.  -Opiate related constipation: advised increasing Senna S to twice a day and using Miralax as well; one time OTC Magnesium Citrate if she still cannot move bowels.  -Will switch pt from Oxycontin to Oxycodone every 4-6 hours as needed    6. Smoking history, productive cough    Smoked for 50 years, 1 pack a day  Plan: -Smoking cessation being followed at this time.  7. Moderate to severe protein malnutrition due to weight loss -resolved . Wt Readings from Last 3 Encounters:  05/15/18 116 lb 8 oz (52.8 kg)  05/08/18 117 lb 12.8 oz (53.4 kg)  05/01/18 119 lb 9.6 oz (54.3 kg)   8. Adrenal insufficiency -Increased Hydrocortisone from 52m to 239min the morning, and from 51m58mo 68m43mth lunch -Follow up with endocrinologist at September scheduled visit    Labs today Plan to start Pembrolizumab ASAP within 1 week RTC with Dr KaleIrene Limbo3 weeks for toxicity check with labs Radiation oncology referral for new brain mets     All of the patients questions were answered with apparent satisfaction. The patient knows to call the clinic with any problems, questions or concerns.   The total time spent in the appt was 30 minutes and more than 50% was on counseling and direct patient cares.   GautSullivan LoneMS AAHIVMS SCH [facility]/Oncology Physician [facility]):       [phone] cell):  [phone]):           [phone]/[phone] 1:42 PM  I, SchuBaldwin [place] acting as a scribe for Dr. KaleIrene Limbo have reviewed the above documentation for accuracy and completeness, and I agree with the above. .GauBrunetta Genera

## 2018-05-15 ENCOUNTER — Inpatient Hospital Stay (HOSPITAL_BASED_OUTPATIENT_CLINIC_OR_DEPARTMENT_OTHER): Payer: Medicare Other | Admitting: Hematology

## 2018-05-15 ENCOUNTER — Encounter: Payer: Self-pay | Admitting: Radiation Oncology

## 2018-05-15 ENCOUNTER — Telehealth: Payer: Self-pay

## 2018-05-15 ENCOUNTER — Inpatient Hospital Stay: Payer: Medicare Other

## 2018-05-15 VITALS — BP 122/84 | HR 80 | Temp 98.3°F | Resp 18 | Ht 63.0 in | Wt 116.5 lb

## 2018-05-15 DIAGNOSIS — C787 Secondary malignant neoplasm of liver and intrahepatic bile duct: Secondary | ICD-10-CM | POA: Diagnosis not present

## 2018-05-15 DIAGNOSIS — Z5112 Encounter for antineoplastic immunotherapy: Secondary | ICD-10-CM | POA: Diagnosis not present

## 2018-05-15 DIAGNOSIS — E871 Hypo-osmolality and hyponatremia: Secondary | ICD-10-CM

## 2018-05-15 DIAGNOSIS — K5903 Drug induced constipation: Secondary | ICD-10-CM

## 2018-05-15 DIAGNOSIS — E2740 Unspecified adrenocortical insufficiency: Secondary | ICD-10-CM | POA: Diagnosis not present

## 2018-05-15 DIAGNOSIS — E860 Dehydration: Secondary | ICD-10-CM | POA: Diagnosis present

## 2018-05-15 DIAGNOSIS — Z87891 Personal history of nicotine dependence: Secondary | ICD-10-CM

## 2018-05-15 DIAGNOSIS — C7931 Secondary malignant neoplasm of brain: Secondary | ICD-10-CM | POA: Diagnosis not present

## 2018-05-15 DIAGNOSIS — E876 Hypokalemia: Secondary | ICD-10-CM | POA: Diagnosis not present

## 2018-05-15 DIAGNOSIS — C3491 Malignant neoplasm of unspecified part of right bronchus or lung: Secondary | ICD-10-CM

## 2018-05-15 DIAGNOSIS — C7951 Secondary malignant neoplasm of bone: Secondary | ICD-10-CM

## 2018-05-15 DIAGNOSIS — E43 Unspecified severe protein-calorie malnutrition: Secondary | ICD-10-CM

## 2018-05-15 DIAGNOSIS — G893 Neoplasm related pain (acute) (chronic): Secondary | ICD-10-CM

## 2018-05-15 DIAGNOSIS — C7972 Secondary malignant neoplasm of left adrenal gland: Secondary | ICD-10-CM

## 2018-05-15 DIAGNOSIS — C7889 Secondary malignant neoplasm of other digestive organs: Secondary | ICD-10-CM

## 2018-05-15 DIAGNOSIS — C3490 Malignant neoplasm of unspecified part of unspecified bronchus or lung: Secondary | ICD-10-CM

## 2018-05-15 DIAGNOSIS — E273 Drug-induced adrenocortical insufficiency: Secondary | ICD-10-CM

## 2018-05-15 LAB — CMP (CANCER CENTER ONLY)
ALBUMIN: 3.2 g/dL — AB (ref 3.5–5.0)
ALT: 13 U/L (ref 0–44)
AST: 18 U/L (ref 15–41)
Alkaline Phosphatase: 128 U/L — ABNORMAL HIGH (ref 38–126)
Anion gap: 11 (ref 5–15)
BUN: 15 mg/dL (ref 8–23)
CHLORIDE: 102 mmol/L (ref 98–111)
CO2: 28 mmol/L (ref 22–32)
Calcium: 9.6 mg/dL (ref 8.9–10.3)
Creatinine: 0.58 mg/dL (ref 0.44–1.00)
GFR, Est AFR Am: >60 mL/min (ref >60)
GFR, Estimated: >60 mL/min (ref >60)
GLUCOSE: 74 mg/dL (ref 70–99)
POTASSIUM: 3.7 mmol/L (ref 3.5–5.1)
SODIUM: 141 mmol/L (ref 135–145)
Total Bilirubin: 0.5 mg/dL (ref 0.3–1.2)
Total Protein: 6.7 g/dL (ref 6.5–8.1)

## 2018-05-15 LAB — CBC WITH DIFFERENTIAL/PLATELET
Basophils Absolute: 0 10*3/uL (ref 0.0–0.1)
Basophils Relative: 0 %
EOS PCT: 0 %
Eosinophils Absolute: 0 10*3/uL (ref 0.0–0.5)
HEMATOCRIT: 33.8 % — AB (ref 34.8–46.6)
Hemoglobin: 10.7 g/dL — ABNORMAL LOW (ref 11.6–15.9)
LYMPHS ABS: 1.2 10*3/uL (ref 0.9–3.3)
LYMPHS PCT: 19 %
MCH: 27.7 pg (ref 25.1–34.0)
MCHC: 31.7 g/dL (ref 31.5–36.0)
MCV: 87.6 fL (ref 79.5–101.0)
MONO ABS: 0.7 10*3/uL (ref 0.1–0.9)
Monocytes Relative: 10 %
NEUTROS ABS: 4.6 10*3/uL (ref 1.5–6.5)
Neutrophils Relative %: 71 %
PLATELETS: 322 10*3/uL (ref 145–400)
RBC: 3.86 MIL/uL (ref 3.70–5.45)
RDW: 16 % — AB (ref 11.2–14.5)
WBC: 6.5 10*3/uL (ref 3.9–10.3)

## 2018-05-15 LAB — MAGNESIUM: MAGNESIUM: 2.1 mg/dL (ref 1.7–2.4)

## 2018-05-15 LAB — LACTATE DEHYDROGENASE: LDH: 166 U/L (ref 98–192)

## 2018-05-15 NOTE — Telephone Encounter (Signed)
Printed avs and calender of upcoming appointment. Per 9/20 los 

## 2018-05-18 ENCOUNTER — Other Ambulatory Visit: Payer: Self-pay | Admitting: Family Medicine

## 2018-05-19 ENCOUNTER — Other Ambulatory Visit: Payer: Self-pay | Admitting: Radiation Therapy

## 2018-05-19 DIAGNOSIS — C7949 Secondary malignant neoplasm of other parts of nervous system: Principal | ICD-10-CM

## 2018-05-19 DIAGNOSIS — C7931 Secondary malignant neoplasm of brain: Secondary | ICD-10-CM

## 2018-05-19 NOTE — Progress Notes (Addendum)
Location/Histology of Brain Tumor: Extensive stage primary small cell carcinoma of lung metastases to liver and bone,metastases to liver ,bone and adrenal mets, new brain metastasis   Patient presented with symptoms of: 05-02-18 Hyponatremia , failure to thrive , poor p.o. Intake and having some mild headaches and confusion.  She has no other acute new focal neurological deficits.  She has a known history of brain mets status post whole brain radiation therapy in June 2019.  Past or anticipated interventions, if any, per neurosurgery:   Past or anticipated interventions, if any, per medical oncology: Dr. Irene Limbo  05-19-18 Urgent Oncology referral with Dr. Lisbeth Renshaw  We will get MRI of the brain to look for interval concerns for progression especially in the setting of some headaches and to determine if this is the cause of her hyponatremia  IMPRESSION: 05-02-18 MRI Brain W WO Contrast 1. Positive response to radiation therapy with interval decrease in size of most lesions. 2. New or increased size of 3 mm cortical lesion in the right frontal lobe on image 43 of series 11. 3. New 3 mm lesion along the medial right temporal lobe. This may be artifact. Please see the discussion above. 4. Increasing white matter disease likely reflects the sequela of chronic microvascular ischemia. 5. New osseous metastasis at C2 without pathologic fracture. 6. Diffuse osseous metastases throughout the calvarium.   Electronically Signed By: San Morelle M.D. On: 05/02/2018 16:36  Signed by San Morelle, MD on 05/02/2018 4:36 PM   IMPRESSION:05-02-18 CT Chest W Contrast 1. Numerous liver metastases, increased in size and number since 04/12/2018 CT. 2. 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 3. Porta hepatis adenopathy is mildly increased and suspicious for nodal metastasis. 4. Right infrahilar infiltrative lung mass is mildly increased in size. 5. Widespread patchy confluent  sclerotic osseous metastatic disease throughout the axial and proximal appendicular skeleton, not appreciably changed since 04/12/2018 CT abdomen/pelvis study, clearly progressed since 12/19/2017 chest CT. Stable L2 vertebral pathologic fracture. 6. Decreased intrahepatic biliary ductal dilatation with improved pneumobilia status post CBD stent revision. 7. Trace pericardial effusion, slightly increased. Trace right pleural effusion, stable. Trace pelvic ascites. 8. Aortic Atherosclerosis (ICD10-I70.0) and Emphysema (ICD10-J43.9).   Electronically Signed By: Ilona Sorrel M.D.   IMPRESSION:05-02-18 CT Abdomen Pelvis W Contrast 1. Numerous liver metastases, increased in size and number since 04/12/2018 CT. 2. 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 3. Porta hepatis adenopathy is mildly increased and suspicious for nodal metastasis. 4. Right infrahilar infiltrative lung mass is mildly increased in size. 5. Widespread patchy confluent sclerotic osseous metastatic disease throughout the axial and proximal appendicular skeleton, not appreciably changed since 04/12/2018 CT abdomen/pelvis study, clearly progressed since 12/19/2017 chest CT. Stable L2 vertebral pathologic fracture. 6. Decreased intrahepatic biliary ductal dilatation with improved pneumobilia status post CBD stent revision. 7. Trace pericardial effusion, slightly increased. Trace right pleural effusion, stable. Trace pelvic ascites. 8. Aortic Atherosclerosis (ICD10-I70.0) and Emphysema (ICD10-J43.9).   Electronically Signed By: Ilona Sorrel M.D. On: 05/02/2018 20:50  12/26/17 PET which revealed Marked improvement in the previously demonstrated widespread hypermetabolic metastatic disease. There is only mild residual hypermetabolic activity within some of the osseous lesions   11/28/17 Brain MRI which revealed stable lesions, and new nonenhancing lesions. I discussed this imaging study with Dr Ulis Rias our  neuro-oncologist -- no area of critical brain mets that needs patient to rush into WBRT at this time.   CT abd 8/18-- concern for progression of mets in liver.   Dose of  Decadron, if applicable: No IVF and stress dose steroids Cortef 10 mg two tablets ( 20 mg) with breakfast and 10 mg tablet at lunch   Recent neurologic symptoms, if any:   Seizures: No  Headaches: Mention of headaches on hospital admission 05-02-18 headaches are ongoing not everyday, reports she is having sharp pains in her head.  Nausea:yes in the mornings taking Zofran  Dizziness/ataxia: No  Difficulty with hand coordination:No  Focal numbness/weakness: No  Visual deficits/changes: No wears glasses  Confusion/Memory deficits:No alert and oriented x 3  Painful bone metastases at present, if OEV:OJJKK back most of the time without pain medication took Oxycodone before coming in for her appointment today.  SAFETY ISSUES:  Prior radiation? :01/15/2018 - 01/29/2018 Whole Brain / 30 Gy in 10 fractions  Pacemaker/ICD? :No  Possible current pregnancy? :No  Is the patient on methotrexate? : No  Additional Complaints / other details:  Wt Readings from Last 3 Encounters:  05/21/18 115 lb 6.4 oz (52.3 kg)  05/15/18 116 lb 8 oz (52.8 kg)  05/08/18 117 lb 12.8 oz (53.4 kg)  BP 118/80 (BP Location: Left Arm, Patient Position: Sitting)   Pulse 80   Temp 98.2 F (36.8 C) (Oral)   Resp 20   Ht 5\' 3"  (1.6 m)   Wt 115 lb 6.4 oz (52.3 kg)   SpO2 97%   BMI 20.44 kg/m

## 2018-05-21 ENCOUNTER — Ambulatory Visit
Admission: RE | Admit: 2018-05-21 | Discharge: 2018-05-21 | Disposition: A | Discharge status: Home / Self Care | Payer: Medicare Other | Source: Ambulatory Visit | Attending: Radiation Oncology | Admitting: Radiation Oncology

## 2018-05-21 ENCOUNTER — Other Ambulatory Visit: Payer: Self-pay | Admitting: Radiation Therapy

## 2018-05-21 ENCOUNTER — Encounter: Payer: Self-pay | Admitting: Radiation Oncology

## 2018-05-21 VITALS — BP 118/80 | HR 80 | Temp 98.2°F | Resp 20 | Ht 63.0 in | Wt 115.4 lb

## 2018-05-21 DIAGNOSIS — C7931 Secondary malignant neoplasm of brain: Secondary | ICD-10-CM | POA: Diagnosis not present

## 2018-05-21 DIAGNOSIS — C7951 Secondary malignant neoplasm of bone: Secondary | ICD-10-CM | POA: Insufficient documentation

## 2018-05-21 DIAGNOSIS — Z87891 Personal history of nicotine dependence: Secondary | ICD-10-CM | POA: Diagnosis not present

## 2018-05-21 DIAGNOSIS — C7949 Secondary malignant neoplasm of other parts of nervous system: Principal | ICD-10-CM

## 2018-05-21 DIAGNOSIS — E2740 Unspecified adrenocortical insufficiency: Secondary | ICD-10-CM | POA: Insufficient documentation

## 2018-05-21 DIAGNOSIS — C787 Secondary malignant neoplasm of liver and intrahepatic bile duct: Secondary | ICD-10-CM | POA: Diagnosis not present

## 2018-05-21 DIAGNOSIS — C3491 Malignant neoplasm of unspecified part of right bronchus or lung: Secondary | ICD-10-CM | POA: Diagnosis not present

## 2018-05-21 DIAGNOSIS — C7972 Secondary malignant neoplasm of left adrenal gland: Secondary | ICD-10-CM | POA: Insufficient documentation

## 2018-05-21 DIAGNOSIS — Z79899 Other long term (current) drug therapy: Secondary | ICD-10-CM | POA: Insufficient documentation

## 2018-05-21 DIAGNOSIS — C3490 Malignant neoplasm of unspecified part of unspecified bronchus or lung: Secondary | ICD-10-CM

## 2018-05-21 NOTE — Progress Notes (Signed)
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  In summary this is a very pleasant woman with a history of extensive stage small cell carcinoma of the lung who also presented with brain metastases at the time of her staging.  She has received whole brain radiation therapy, and continues with Dr. Irene Limbo on systemic therapy.  Her regimen includes  She has been having ongoing issues with symptoms of hyponatremia, possibly SIADH, and adrenal insufficiency. She continues to take hydrocortisone daily, but states that she has not been seen by endocrinology.  Of note the patient did undergo a CT of the brain without contrast on 03/02/2018 which did not show any evidence of concern for edema or metastatic deposits that had been seen previously on her MRI though this was a noncontrasted and CT based study.  She was going to have repeat imaging in October based on this, however she was found to have progressive disease with her most recent staging scans in the chest, bones, and liver. Her MRI brain also revealed concerns for two separate 3 mm lesions in the right frontal and right medial temporal lobe. She comes today to discuss options of treatment to the brain. She is tentatively scheduled for stereotactic radiosurgery planning and treatment, as well as conference review next week.  On review of systems, the patient reports that she is doing well overall. She denies any chest pain, shortness of breath, cough, fevers, chills, night sweats, unintended weight changes. She denies any  bowel or bladder disturbances, and denies abdominal pain, nausea or vomiting. She does have some left sided headaches. She denies any new musculoskeletal or joint aches or pains, new skin lesions or concerns. A complete review of systems is obtained and is otherwise negative.  Past Medical History:  Past Medical History:  Diagnosis Date  . Allergy   . Back pain   . Osteopenia   . Small cell lung cancer ([place]) 10/17/2017   Extended stage with metastases to liver and bone  . Tubular adenoma of colon 10/26/2014    Past Surgical History: Past Surgical History:  Procedure Laterality Date  . BILIARY STENT PLACEMENT N/A 04/14/2018   Procedure: BILIARY STENT PLACEMENT;  Surgeon: [name], MD;  Location: [facility];  Service: Gastroenterology;  Laterality: N/A;  . COLONOSCOPY    . COLONOSCOPY WITH PROPOFOL N/A 10/25/2014   Procedure: COLONOSCOPY WITH PROPOFOL;  Surgeon: Garlan Fair, MD;  Location: [facility];  Service: Endoscopy;  Laterality: N/A;  . ERCP N/A 10/15/2017   Procedure: ENDOSCOPIC RETROGRADE CHOLANGIOPANCREATOGRAPHY (ERCP);  Surgeon: Clarene Essex, MD;  Location: [facility];  Service: Endoscopy;  Laterality: N/A;  . ERCP N/A 04/14/2018   Procedure: ENDOSCOPIC RETROGRADE CHOLANGIOPANCREATOGRAPHY (ERCP);  Surgeon: [name], MD;  Location: [facility];  Service: Gastroenterology;  Laterality: N/A;  with stent change and Balloon Sweep  . IR FLUORO GUIDE PORT INSERTION RIGHT  11/10/2017  . IR RADIOLOGIST EVAL & MGMT  10/02/2017  . IR US GUIDE VASC ACCESS RIGHT  11/10/2017  . PORT-A-CATH REMOVAL N/A 04/16/2018  Procedure: REMOVAL PORT-A-CATH;  Surgeon: Clovis Riley, MD;  Location: [facility];  Service: General;  Laterality: N/A;    Social History:  Social History   Socioeconomic History  . Marital status: Single    Spouse name: Not on file  . Number of children: Not on file  . Years of education: Not on file  . Highest education level: Not on file  Occupational History    . Not on file  Social Needs  . Financial resource strain: Not on file  . Food insecurity:    Worry: Not on file    Inability: Not on file  . Transportation needs:    Medical: No    Non-medical: No  Tobacco Use  . Smoking status: Former Smoker    Packs/day: 1.00    Years: 40.00    Pack years: 40.00    Types: Cigarettes    Last attempt to quit: 09/24/2017    Years since quitting: 0.6  . Smokeless tobacco: Never Used  Substance and Sexual Activity  . Alcohol use: Yes    Comment: rare social  . Drug use: No  . Sexual activity: Not Currently  Lifestyle  . Physical activity:    Days per week: Not on file    Minutes per session: Not on file  . Stress: Not on file  Relationships  . Social connections:    Talks on phone: Not on file    Gets together: Not on file    Attends religious service: Not on file    Active member of club or organization: Not on file    Attends meetings of clubs or organizations: Not on file    Relationship status: Not on file  . Intimate partner violence:    Fear of current or ex partner: No    Emotionally abused: No    Physically abused: No    Forced sexual activity: No  Other Topics Concern  . Not on file  Social History Narrative  . Not on file  The patient is single and lives in [place]. She is originally from the [place].  Family History: Family History  Problem Relation Age of Onset  . Diabetes Mother   . Hypertension Mother   . Diabetes Sister   . Hypertension Sister   . Hyperlipidemia Sister   . Adrenal disorder Neg Hx      ALLERGIES:  has No Known Allergies.  Meds: Current Outpatient Medications  Medication Sig Dispense Refill  . calcium carbonate (OS-CAL - DOSED IN MG OF ELEMENTAL CALCIUM) 1250 (500 Ca) MG tablet Take 1 tablet by mouth daily.     . cholecalciferol (VITAMIN D) 1000 units tablet Take 1,000 Units by mouth daily.    Marland Kitchen dronabinol (MARINOL) 2.5 MG capsule Take 1 capsule (2.5 mg total) by mouth 2 (two) times  daily before lunch and supper. 60 capsule 0  . famotidine (PEPCID AC) 10 MG chewable tablet Chew 10 mg by mouth 2 (two) times daily.    . hydrocortisone (CORTEF) 10 MG tablet Take 2 tablets (20 mg total) by mouth daily with breakfast. And 10mg  po daily with lunch. (Patient taking differently: Take 10-20 mg by mouth See admin instructions. 20 mg every morning And 10mg  with lunch.) 90 tablet 1  . KLOR-CON M20 20 MEQ tablet TAKE 1 TABLET (20 MEQ TOTAL) BY MOUTH DAILY. TAKE FOR 7 DAYS THEN REPEAT LABS. 90 tablet 1  . Multiple Vitamins-Minerals (MULTIVITAMIN WITH MINERALS) tablet Take 1 tablet by mouth daily.    Marland Kitchen  ondansetron (ZOFRAN) 4 MG tablet Take 1 tablet (4 mg total) by mouth every 8 (eight) hours as needed for nausea or vomiting. 20 tablet 0  . oxyCODONE (OXY IR/ROXICODONE) 5 MG immediate release tablet Take 1-2 tablets (5-10 mg total) by mouth every 4 (four) hours as needed for moderate pain or severe pain. 50 tablet 0  . polyethylene glycol (MIRALAX) packet Take 17 g by mouth daily as needed for moderate constipation. 30 each 0  . prochlorperazine (COMPAZINE) 10 MG tablet Take 1 tablet (10 mg total) by mouth every 6 (six) hours as needed (Nausea or vomiting). 30 tablet 1  . senna-docusate (SENNA S) 8.6-50 MG tablet Take 2 tablets by mouth at bedtime. (Patient taking differently: Take 2 tablets by mouth at bedtime as needed for mild constipation or moderate constipation. ) 60 tablet 1  . sodium chloride 1 g tablet Take 5 tablets (5 g total) by mouth 2 (two) times daily with a meal. 150 tablet 5  . metoCLOPramide (REGLAN) 10 MG tablet Take 1 tablet (10 mg total) by mouth every 8 (eight) hours as needed for nausea. (Patient not taking: Reported on 05/21/2018) 45 tablet 1   No current facility-administered medications for this encounter.    Facility-Administered Medications Ordered in Other Encounters  Medication Dose Route Frequency Provider Last Rate Last Dose  . acetaminophen (TYLENOL) tablet 650  mg  650 mg Oral Once Curt Bears, MD      . diphenhydrAMINE (BENADRYL) capsule 25 mg  25 mg Oral Once Curt Bears, MD        Physical Findings:  height is 5\' 3"  (1.6 m) and weight is 115 lb 6.4 oz (52.3 kg). Her oral temperature is 98.2 F (36.8 C). Her blood pressure is 118/80 and her pulse is 80. Her respiration is 20 and oxygen saturation is 97%.  Pain Assessment Pain Score: 0-No pain/10 In general this is a chronically ill appearing Caucasian female in no acute distress.  She's alert and oriented x4 and appropriate throughout the examination. Cardiopulmonary assessment is negative for acute distress and she exhibits normal effort.  No desquamation is noted along the visible portions of her scalp.  She denies any concerns with the skin along her scalp.  HEENT reveals that she is normocephalic atraumatic EOMs are intact.  Her previously noted moon faces have resolved.  Lab Findings: Lab Results  Component Value Date   WBC 6.5 05/15/2018   HGB 10.7 (L) 05/15/2018   HCT 33.8 (L) 05/15/2018   MCV 87.6 05/15/2018   PLT 322 05/15/2018     Radiographic Findings: Ct Chest W Contrast  Result Date: 05/02/2018 CLINICAL DATA:  Extensive stage small cell right lung cancer. Failure to thrive. Hyponatremia. Restaging. Inpatient. EXAM: CT CHEST, ABDOMEN, AND PELVIS WITH CONTRAST TECHNIQUE: Multidetector CT imaging of the chest, abdomen and pelvis was performed following the standard protocol during bolus administration of intravenous contrast. CONTRAST:  165mL OMNIPAQUE IOHEXOL 300 MG/ML SOLN, 75mL ISOVUE-300 IOPAMIDOL (ISOVUE-300) INJECTION 61% COMPARISON:  04/12/2018 CT abdomen/pelvis and chest radiograph. 02/25/2018 MRI abdomen. 12/26/2017 PET-CT. 12/19/2017 chest CT. FINDINGS: CT CHEST FINDINGS Cardiovascular: Normal heart size. Trace pericardial effusion/thickening, slightly increased. Three-vessel coronary atherosclerosis. Left PICC terminates in the upper third of the SVC. Atherosclerotic  nonaneurysmal thoracic aorta. Stable top-normal main pulmonary artery (3.1 cm diameter). No central pulmonary emboli. Mediastinum/Nodes: No discrete thyroid nodules. Unremarkable esophagus. No axillary adenopathy. No left hilar adenopathy. No new mediastinal adenopathy. Lungs/Pleura: No pneumothorax. Stable trace dependent right pleural effusion. No left pleural  effusion. Severe centrilobular emphysema with diffuse bronchial wall thickening. Infiltrative poorly marginated right infrahilar 4.0 x 2.7 cm lung mass (series 2/image 35), contiguous with right hilar and subcarinal adenopathy, mildly increased from 3.7 x 2.1 cm on 04/12/2018 CT abdomen study. Stable complete right lower lobe and near complete right middle lobe atelectasis. No new significant pulmonary nodules. Musculoskeletal: Widespread patchy confluent sclerotic osseous metastases throughout the thoracic spine, sternum, bilateral ribs and bilateral shoulder girdles, significantly increased in extent and sclerosis since 12/19/2017 chest CT, not appreciably changed in the lower thoracic spine since 04/12/2018 CT abdomen. Mild thoracic spondylosis. CT ABDOMEN PELVIS FINDINGS Hepatobiliary: Numerous (at least 10) hypoenhancing liver masses scattered throughout the liver, increased in size and number since 04/12/2018 CT. Representative posterior right liver lobe 3.4 cm mass (series 2/image 58), increased from 2.3 cm. Representative superior right liver lobe 2.5 cm mass (series 2/image 48), increased from 1.9 cm. Representative inferior left liver lobe 2.7 cm mass (series 2/image 65), increased from 1.6 cm. Well-positioned CBD stent with the lower tip abutting the duodenal lumen at the ampulla. Decreased intrahepatic biliary ductal dilatation with significantly increased pneumobilia. Pancreas: No discrete pancreatic mass. Stable top-normal caliber main pancreatic duct. Spleen: Normal size. No mass. Adrenals/Urinary Tract: Left adrenal 2.0 cm nodule (series  2/image 61), increased from 1.5 cm. No discrete right adrenal nodule. Normal kidneys with no hydronephrosis and no renal mass. Normal bladder. Stomach/Bowel: Normal non-distended stomach. Normal caliber small bowel with no small bowel wall thickening. Normal appendix. Moderate colonic stool. No large bowel wall thickening or acute pericolonic fat stranding. Vascular/Lymphatic: Atherosclerotic nonaneurysmal abdominal aorta. Patent portal, splenic, hepatic and renal veins. Enlarged 1.5 cm porta hepatis node (series 2/image 62), increased from 1.1 cm. No additional pathologically enlarged lymph nodes in the abdomen or pelvis. Reproductive: Grossly normal uterus.  No adnexal mass. Other: No pneumoperitoneum. No focal fluid collection. Trace pelvic ascites. Stable subcutaneous 1.6 cm cystic lesion in the ventral right pelvic wall (series 2/image 111), probably a sebaceous cyst. Musculoskeletal: Widespread patchy confluent sclerotic osseous metastases throughout the lumbar spine and bilateral pelvic girdle, not appreciably changed since 04/12/2018 CT. Stable pathologic L2 vertebral compression fracture with stable 5 mm retropulsion of L2 posterior vertebral osseous fragments. IMPRESSION: 1. Numerous liver metastases, increased in size and number since 04/12/2018 CT. 2. 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 3. Porta hepatis adenopathy is mildly increased and suspicious for nodal metastasis. 4. Right infrahilar infiltrative lung mass is mildly increased in size. 5. Widespread patchy confluent sclerotic osseous metastatic disease throughout the axial and proximal appendicular skeleton, not appreciably changed since 04/12/2018 CT abdomen/pelvis study, clearly progressed since 12/19/2017 chest CT. Stable L2 vertebral pathologic fracture. 6. Decreased intrahepatic biliary ductal dilatation with improved pneumobilia status post CBD stent revision. 7. Trace pericardial effusion, slightly increased. Trace right pleural  effusion, stable. Trace pelvic ascites. 8. Aortic Atherosclerosis (ICD10-I70.0) and Emphysema (ICD10-J43.9). Electronically Signed   By: Ilona Sorrel M.D.   On: 05/02/2018 20:50   Mr Jeri Cos IE Contrast  Result Date: 05/02/2018 CLINICAL DATA:  Small cell lung cancer. Status post whole-brain radiation. Headaches. New onset of weakness and dizziness. EXAM: MRI HEAD WITHOUT AND WITH CONTRAST TECHNIQUE: Multiplanar, multiecho pulse sequences of the brain and surrounding structures were obtained without and with intravenous contrast. CONTRAST:  5 mL Gadavist COMPARISON:  MRI brain 01/12/2018. CT head without contrast 02/24/2018 and 04/12/2018. FINDINGS: Brain: Majority of lesions have decreased in size. There 2 new or enlarging lesions, each measuring 3 mm. A  3 mm lesion is present in the medial right temporal lobe. This may be artifact from adjacent vessel. A 3 mm cortical lesion is present in the right frontal lobe on image 43 of series 11. Lesion adjacent to the right lateral ventricle now measures 5 mm, stable to slightly decreased. A lesion along the splenium of the corpus callosum is decreased in size, now measuring 4 mm. No acute infarct or hemorrhage is present. At least 3 of the lesions demonstrate restricted diffusion, compatible with small cell lung cancer. Mild periventricular white matter changes have increased since the prior study, compatible with whole-brain radiation Vascular: Flow is present in the major intracranial arteries. Skull and upper cervical spine: The craniocervical junction is normal. Progressive sclerotic lesion is present at C2 without collapse. There is diffuse metastatic infiltration of the calvarium. Sinuses/Orbits: The paranasal sinuses are clear. There is fluid in the mastoid air cells bilaterally. No obstructing nasopharyngeal lesion is present. Globes and orbits are within normal limits. IMPRESSION: 1. Positive response to radiation therapy with interval decrease in size of most  lesions. 2. New or increased size of 3 mm cortical lesion in the right frontal lobe on image 43 of series 11. 3. New 3 mm lesion along the medial right temporal lobe. This may be artifact. Please see the discussion above. 4. Increasing white matter disease likely reflects the sequela of chronic microvascular ischemia. 5. New osseous metastasis at C2 without pathologic fracture. 6. Diffuse osseous metastases throughout the calvarium. Electronically Signed   By: San Morelle M.D.   On: 05/02/2018 16:36   Ct Abdomen Pelvis W Contrast  Result Date: 05/02/2018 CLINICAL DATA:  Extensive stage small cell right lung cancer. Failure to thrive. Hyponatremia. Restaging. Inpatient. EXAM: CT CHEST, ABDOMEN, AND PELVIS WITH CONTRAST TECHNIQUE: Multidetector CT imaging of the chest, abdomen and pelvis was performed following the standard protocol during bolus administration of intravenous contrast. CONTRAST:  173mL OMNIPAQUE IOHEXOL 300 MG/ML SOLN, 38mL ISOVUE-300 IOPAMIDOL (ISOVUE-300) INJECTION 61% COMPARISON:  04/12/2018 CT abdomen/pelvis and chest radiograph. 02/25/2018 MRI abdomen. 12/26/2017 PET-CT. 12/19/2017 chest CT. FINDINGS: CT CHEST FINDINGS Cardiovascular: Normal heart size. Trace pericardial effusion/thickening, slightly increased. Three-vessel coronary atherosclerosis. Left PICC terminates in the upper third of the SVC. Atherosclerotic nonaneurysmal thoracic aorta. Stable top-normal main pulmonary artery (3.1 cm diameter). No central pulmonary emboli. Mediastinum/Nodes: No discrete thyroid nodules. Unremarkable esophagus. No axillary adenopathy. No left hilar adenopathy. No new mediastinal adenopathy. Lungs/Pleura: No pneumothorax. Stable trace dependent right pleural effusion. No left pleural effusion. Severe centrilobular emphysema with diffuse bronchial wall thickening. Infiltrative poorly marginated right infrahilar 4.0 x 2.7 cm lung mass (series 2/image 35), contiguous with right hilar and subcarinal  adenopathy, mildly increased from 3.7 x 2.1 cm on 04/12/2018 CT abdomen study. Stable complete right lower lobe and near complete right middle lobe atelectasis. No new significant pulmonary nodules. Musculoskeletal: Widespread patchy confluent sclerotic osseous metastases throughout the thoracic spine, sternum, bilateral ribs and bilateral shoulder girdles, significantly increased in extent and sclerosis since 12/19/2017 chest CT, not appreciably changed in the lower thoracic spine since 04/12/2018 CT abdomen. Mild thoracic spondylosis. CT ABDOMEN PELVIS FINDINGS Hepatobiliary: Numerous (at least 10) hypoenhancing liver masses scattered throughout the liver, increased in size and number since 04/12/2018 CT. Representative posterior right liver lobe 3.4 cm mass (series 2/image 58), increased from 2.3 cm. Representative superior right liver lobe 2.5 cm mass (series 2/image 48), increased from 1.9 cm. Representative inferior left liver lobe 2.7 cm mass (series 2/image 65), increased from 1.6 cm.  Well-positioned CBD stent with the lower tip abutting the duodenal lumen at the ampulla. Decreased intrahepatic biliary ductal dilatation with significantly increased pneumobilia. Pancreas: No discrete pancreatic mass. Stable top-normal caliber main pancreatic duct. Spleen: Normal size. No mass. Adrenals/Urinary Tract: Left adrenal 2.0 cm nodule (series 2/image 61), increased from 1.5 cm. No discrete right adrenal nodule. Normal kidneys with no hydronephrosis and no renal mass. Normal bladder. Stomach/Bowel: Normal non-distended stomach. Normal caliber small bowel with no small bowel wall thickening. Normal appendix. Moderate colonic stool. No large bowel wall thickening or acute pericolonic fat stranding. Vascular/Lymphatic: Atherosclerotic nonaneurysmal abdominal aorta. Patent portal, splenic, hepatic and renal veins. Enlarged 1.5 cm porta hepatis node (series 2/image 62), increased from 1.1 cm. No additional pathologically  enlarged lymph nodes in the abdomen or pelvis. Reproductive: Grossly normal uterus.  No adnexal mass. Other: No pneumoperitoneum. No focal fluid collection. Trace pelvic ascites. Stable subcutaneous 1.6 cm cystic lesion in the ventral right pelvic wall (series 2/image 111), probably a sebaceous cyst. Musculoskeletal: Widespread patchy confluent sclerotic osseous metastases throughout the lumbar spine and bilateral pelvic girdle, not appreciably changed since 04/12/2018 CT. Stable pathologic L2 vertebral compression fracture with stable 5 mm retropulsion of L2 posterior vertebral osseous fragments. IMPRESSION: 1. Numerous liver metastases, increased in size and number since 04/12/2018 CT. 2. 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 3. Porta hepatis adenopathy is mildly increased and suspicious for nodal metastasis. 4. Right infrahilar infiltrative lung mass is mildly increased in size. 5. Widespread patchy confluent sclerotic osseous metastatic disease throughout the axial and proximal appendicular skeleton, not appreciably changed since 04/12/2018 CT abdomen/pelvis study, clearly progressed since 12/19/2017 chest CT. Stable L2 vertebral pathologic fracture. 6. Decreased intrahepatic biliary ductal dilatation with improved pneumobilia status post CBD stent revision. 7. Trace pericardial effusion, slightly increased. Trace right pleural effusion, stable. Trace pelvic ascites. 8. Aortic Atherosclerosis (ICD10-I70.0) and Emphysema (ICD10-J43.9). Electronically Signed   By: Ilona Sorrel M.D.   On: 05/02/2018 20:50    Impression/Plan: 1. 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 The patient is counseled on the findings from her recent MRI scan we will review her case in brain oncology conference on Monday, I will let her know the discussion, and if there is concern that these 2 areas represent new or progressive disease, we will proceed with her scheduled 3T MRI scan followed by  subsequent stereotactic radiosurgery.  She is in agreement.  We discussed the risks, benefits, short and long-term effects of therapy, and she is interested in proceeding if required.  She will also begin systemic Keytruda again on 05/22/2018. 2. Adrenal insufficiency.  She will make sure Dr. Loanne Drilling and Dr. Irene Limbo are aware of her current hydrocortisone and sodium doses.  3. 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 The patient has a biliary stent which will be followed with subsequent evaluations.  4. History of L2 compression fracture felt to be pathologic.  The patient is not symptomatic, and we will continue to follow her regarding this. She may be a candidate for osteocool +/- radiotherapy if she became symptomatic.    The above documentation reflects my direct findings during this shared patient visit. Please see the separate note by Dr. Lisbeth Renshaw on this date for the remainder of the patient's plan of care.     Carola Rhine, PAC

## 2018-05-21 NOTE — Addendum Note (Signed)
Encounter addended by: Malena Edman, RN on: 05/21/2018 3:35 PM  Actions taken: Charge Capture section accepted

## 2018-05-22 ENCOUNTER — Other Ambulatory Visit: Payer: Self-pay | Admitting: Hematology

## 2018-05-22 ENCOUNTER — Telehealth: Payer: Self-pay

## 2018-05-22 ENCOUNTER — Inpatient Hospital Stay: Payer: Medicare Other

## 2018-05-22 VITALS — BP 122/72 | HR 87 | Temp 98.3°F | Resp 18

## 2018-05-22 DIAGNOSIS — Z7189 Other specified counseling: Secondary | ICD-10-CM

## 2018-05-22 DIAGNOSIS — Z5112 Encounter for antineoplastic immunotherapy: Secondary | ICD-10-CM | POA: Diagnosis not present

## 2018-05-22 DIAGNOSIS — C3490 Malignant neoplasm of unspecified part of unspecified bronchus or lung: Secondary | ICD-10-CM

## 2018-05-22 MED ORDER — SODIUM CHLORIDE 0.9 % IV SOLN
200.0000 mg | Freq: Once | INTRAVENOUS | Status: AC
  Administered 2018-05-22: 200 mg via INTRAVENOUS
  Filled 2018-05-22: qty 8

## 2018-05-22 MED ORDER — SODIUM CHLORIDE 0.9 % IV SOLN
Freq: Once | INTRAVENOUS | Status: AC
  Administered 2018-05-22: 14:00:00 via INTRAVENOUS
  Filled 2018-05-22: qty 250

## 2018-05-22 NOTE — Progress Notes (Signed)
DISCONTINUE ON PATHWAY REGIMEN - Small Cell Lung     Cycles 1 through 4, every 21 days:     Atezolizumab      Carboplatin      Etoposide    Cycles 5 and beyond, every 21 days:     Atezolizumab   **Always confirm dose/schedule in your pharmacy ordering system**  REASON: Disease Progression PRIOR TREATMENT: [id number]: Atezolizumab 1200 mg D1 + Carboplatin AUC=5 D1 + Etoposide 100 mg/m2 D1-3 q21 Days x 4 Cycles, Followed by Atezolizumab 1200 mg Maintenance Until Progression or Unacceptable Toxicity TREATMENT RESPONSE: Partial Response (PR)  START OFF PATHWAY REGIMEN - Small Cell Lung   [id number]:Pembrolizumab 200 mg q21 Days:   A cycle is 21 days:     Pembrolizumab   **Always confirm dose/schedule in your pharmacy ordering system**  Patient Characteristics: Relapsed or Progressive Disease, Second Line, Relapse 3 - 6 Months Therapeutic Status: Relapsed or Progressive Disease Line of Therapy: Second Line Time to Relapse: Relapse 3 - 6 Months Intent of Therapy: Non-Curative / Palliative Intent, Discussed with Patient

## 2018-05-22 NOTE — Telephone Encounter (Signed)
Per Dr. Irene Limbo it is ok to use labs from 05/22/18 for today's treatment.

## 2018-05-22 NOTE — Progress Notes (Signed)
ON [facility] - Small Cell Lung  No Change  Continue With Treatment as Ordered.     Cycles 1 through 4, every 21 days:     Atezolizumab      Carboplatin      Etoposide    Cycles 5 and beyond, every 21 days:     Atezolizumab   **Always confirm dose/schedule in your pharmacy ordering system**  Patient Characteristics: Extensive Stage, First Line Stage Classification: Extensive AJCC T Category: TX AJCC N Category: NX AJCC M Category: M1c AJCC 8 Stage Grouping: Staged < 8th Ed. Line of therapy: First Line  Intent of Therapy: Non-Curative / Palliative Intent, Discussed with Patient

## 2018-05-22 NOTE — Patient Instructions (Signed)
[facility] Discharge Instructions for Patients Receiving Chemotherapy  Today you received the following chemotherapy agents Keytruda.  To help prevent nausea and vomiting after your treatment, we encourage you to take your nausea medication as directed.  If you develop nausea and vomiting that is not controlled by your nausea medication, call the clinic.   BELOW ARE SYMPTOMS THAT SHOULD BE REPORTED IMMEDIATELY:  *FEVER GREATER THAN 100.5 F  *CHILLS WITH OR WITHOUT FEVER  NAUSEA AND VOMITING THAT IS NOT CONTROLLED WITH YOUR NAUSEA MEDICATION  *UNUSUAL SHORTNESS OF BREATH  *UNUSUAL BRUISING OR BLEEDING  TENDERNESS IN MOUTH AND THROAT WITH OR WITHOUT PRESENCE OF ULCERS  *URINARY PROBLEMS  *BOWEL PROBLEMS  UNUSUAL RASH Items with * indicate a potential emergency and should be followed up as soon as possible.  Feel free to call the clinic should you have any questions or concerns. The clinic phone number is [phone].  Please show the [place] at check-in to the Emergency Department and triage nurse.  Pembrolizumab injection What is this medicine? PEMBROLIZUMAB (pem broe liz ue mab) is a monoclonal antibody. It is used to treat melanoma, head and neck cancer, Hodgkin lymphoma, non-small cell lung cancer, urothelial cancer, stomach cancer, and cancers that have a certain genetic condition. This medicine may be used for other purposes; ask your health care provider or pharmacist if you have questions. COMMON BRAND NAME(S): Keytruda What should I tell my health care provider before I take this medicine? They need to know if you have any of these conditions: -diabetes -immune system problems -inflammatory bowel disease -liver disease -lung or breathing disease -lupus -organ transplant -an unusual or allergic reaction to pembrolizumab, other medicines, foods, dyes, or preservatives -pregnant or trying to get pregnant -breast-feeding How should I use  this medicine? This medicine is for infusion into a vein. It is given by a health care professional in a hospital or clinic setting. A special MedGuide will be given to you before each treatment. Be sure to read this information carefully each time. Talk to your pediatrician regarding the use of this medicine in children. While this drug may be prescribed for selected conditions, precautions do apply. Overdosage: If you think you have taken too much of this medicine contact a poison control center or emergency room at once. NOTE: This medicine is only for you. Do not share this medicine with others. What if I miss a dose? It is important not to miss your dose. Call your doctor or health care professional if you are unable to keep an appointment. What may interact with this medicine? Interactions have not been studied. Give your health care provider a list of all the medicines, herbs, non-prescription drugs, or dietary supplements you use. Also tell them if you smoke, drink alcohol, or use illegal drugs. Some items may interact with your medicine. This list may not describe all possible interactions. Give your health care provider a list of all the medicines, herbs, non-prescription drugs, or dietary supplements you use. Also tell them if you smoke, drink alcohol, or use illegal drugs. Some items may interact with your medicine. What should I watch for while using this medicine? Your condition will be monitored carefully while you are receiving this medicine. You may need blood work done while you are taking this medicine. Do not become pregnant while taking this medicine or for 4 months after stopping it. Women should inform their doctor if they wish to become pregnant or think they might be  pregnant. There is a potential for serious side effects to an unborn child. Talk to your health care professional or pharmacist for more information. Do not breast-feed an infant while taking this medicine or for  4 months after the last dose. What side effects may I notice from receiving this medicine? Side effects that you should report to your doctor or health care professional as soon as possible: -allergic reactions like skin rash, itching or hives, swelling of the face, lips, or tongue -bloody or black, tarry -breathing problems -changes in vision -chest pain -chills -constipation -cough -dizziness or feeling faint or lightheaded -fast or irregular heartbeat -fever -flushing -hair loss -low blood counts - this medicine may decrease the number of white blood cells, red blood cells and platelets. You may be at increased risk for infections and bleeding. -muscle pain -muscle weakness -persistent headache -signs and symptoms of high blood sugar such as dizziness; dry mouth; dry skin; fruity breath; nausea; stomach pain; increased hunger or thirst; increased urination -signs and symptoms of kidney injury like trouble passing urine or change in the amount of urine -signs and symptoms of liver injury like dark urine, light-colored stools, loss of appetite, nausea, right upper belly pain, yellowing of the eyes or skin -stomach pain -sweating -weight loss Side effects that usually do not require medical attention (report to your doctor or health care professional if they continue or are bothersome): -decreased appetite -diarrhea -tiredness This list may not describe all possible side effects. Call your doctor for medical advice about side effects. You may report side effects to FDA at 1-800-FDA-1088. Where should I keep my medicine? This drug is given in a hospital or clinic and will not be stored at home. NOTE: This sheet is a summary. It may not cover all possible information. If you have questions about this medicine, talk to your doctor, pharmacist, or health care provider.  2018 [employer]/Gold Standard (2016-05-21 12:29:36)

## 2018-05-25 ENCOUNTER — Inpatient Hospital Stay: Payer: Medicare Other

## 2018-05-25 ENCOUNTER — Other Ambulatory Visit: Payer: Medicare Other

## 2018-05-26 ENCOUNTER — Telehealth: Payer: Self-pay | Admitting: Radiation Oncology

## 2018-05-26 NOTE — Telephone Encounter (Signed)
I called the patient to review that her MRI scan was not definitive and after discussion in brain oncology conference we can hold off on treatment. Her MRI has been re-scheduled for June 26, 2018 at [facility]. She knows not to come for any radiation planning or to meet Dr. Zada Finders. We will discuss treatment if needed after her next scan and in the interim she will follow up next week with Dr. Irene Limbo.

## 2018-05-27 ENCOUNTER — Other Ambulatory Visit: Payer: Medicare Other

## 2018-05-27 NOTE — Progress Notes (Signed)
Brain and Spine Tumor Board Documentation  Haley [name] was presented by Cecil Cobbs, MD at Brain and Spine Tumor Board on 05/27/2018, which included representatives from neuro oncology, radiation oncology, surgical oncology, navigation, pathology, radiology.  Haley [name] was presented as a current patient with history of the following treatments:  .  Additionally, we reviewed previous medical and familial history, history of present illness, and recent lab results along with all available histopathologic and imaging studies. The tumor board considered available treatment options and made the following recommendations:  Additional screening  Repeat MRI to re-characterize sub-cm lesions  Tumor board is a meeting of clinicians from various specialty areas who evaluate and discuss patients for whom a multidisciplinary approach is being considered. Final determinations in the plan of care are those of the provider(s). The responsibility for follow up of recommendations given during tumor board is that of the provider.   Today's extended care, comprehensive team conference, Haley [name] was not present for the discussion and was not examined.

## 2018-05-28 ENCOUNTER — Ambulatory Visit: Payer: Medicare Other | Admitting: Radiation Oncology

## 2018-05-28 ENCOUNTER — Ambulatory Visit: Payer: Medicare Other

## 2018-05-31 ENCOUNTER — Other Ambulatory Visit: Payer: Self-pay | Admitting: Hematology

## 2018-06-02 ENCOUNTER — Encounter (HOSPITAL_COMMUNITY): Payer: Self-pay | Admitting: Psychiatry

## 2018-06-02 ENCOUNTER — Ambulatory Visit (INDEPENDENT_AMBULATORY_CARE_PROVIDER_SITE_OTHER): Payer: Medicare Other | Admitting: Psychiatry

## 2018-06-02 VITALS — BP 118/64 | HR 70 | Ht 63.0 in | Wt 113.0 lb

## 2018-06-02 DIAGNOSIS — F321 Major depressive disorder, single episode, moderate: Secondary | ICD-10-CM | POA: Diagnosis not present

## 2018-06-02 MED ORDER — DULOXETINE HCL 20 MG PO CPEP: ORAL_CAPSULE | ORAL | 1 refills | Status: DC

## 2018-06-02 NOTE — Progress Notes (Signed)
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 eGFR has been calculated using the CKD EPI equation. This calculation has not been validated in all clinical situations. eGFR's persistently <60 mL/min signify possible Chronic Kidney Disease.    Anion gap 8 5 - 15    Comment: Performed at [facility], [place] [address] [place], [place] [address]  CBC with Differential/Platelet     Status: Abnormal   Collection Time: 04/14/18  4:10 AM  Result Value Ref Range   WBC 16.2 (H) 4.0 - 10.5 K/uL   RBC 3.37 (L) 3.87 - 5.11 MIL/uL   Hemoglobin 9.9 (L) 12.0 - 15.0 g/dL   HCT 29.0 (L) 36.0 - 46.0 %   MCV 86.1 78.0 - 100.0 fL   MCH 29.4 26.0 - 34.0 pg   MCHC 34.1 30.0 - 36.0 g/dL   RDW 15.7 (H) 11.5 - 15.5 %   Platelets 324 150 - 400 K/uL   Neutrophils Relative % 93 %   Neutro Abs 14.9 (H) 1.7 - 7.7 K/uL   Lymphocytes Relative 4 %   Lymphs Abs 0.7 0.7 - 4.0 K/uL   Monocytes Relative 3 %   Monocytes Absolute 0.5 0.1 - 1.0 K/uL   Eosinophils Relative 0 %   Eosinophils Absolute 0.0 0.0 - 0.7 K/uL   Basophils Relative 0 %   Basophils Absolute 0.0 0.0 - 0.1 K/uL    Comment: Performed at [facility], [place] [address], [place], [place] [address]  Basic metabolic panel     Status: Abnormal   Collection Time: 04/15/18  5:00 AM  Result Value Ref Range   Sodium 129 (L) 135 - 145 mmol/L    Comment: DELTA CHECK NOTED NO VISIBLE HEMOLYSIS    Potassium 3.7 3.5 - 5.1 mmol/L   Chloride 97 (L) 98 - 111 mmol/L   CO2 22  22 - 32 mmol/L   Glucose, Bld 109 (H) 70 - 99 mg/dL   BUN 6 (L) 8 - 23 mg/dL   Creatinine, Ser 0.36 (L) 0.44 - 1.00 mg/dL   Calcium 7.5 (L) 8.9 - 10.3 mg/dL   GFR calc non Af Amer >60 >60 mL/min   GFR calc Af Amer >60 >60 mL/min    Comment: (NOTE) The eGFR has been calculated using the CKD EPI equation. This calculation has not been validated in all clinical situations. eGFR's persistently <60 mL/min signify possible Chronic Kidney Disease.    Anion gap 10 5 - 15    Comment:  Performed at [facility], [place] [address], [place], [place] [address]  Magnesium     Status: None   Collection Time: 04/15/18  5:00 AM  Result Value Ref Range   Magnesium 1.8 1.7 - 2.4 mg/dL    Comment: Performed at [facility], [place] [address], [place], [place] [address]  Hepatic function panel     Status: Abnormal   Collection Time: 04/15/18  5:00 AM  Result Value Ref Range   Total Protein 5.8 (L) 6.5 - 8.1 g/dL   Albumin 2.6 (L) 3.5 - 5.0 g/dL   AST 34 15 - 41 U/L   ALT 78 (H) 0 - 44 U/L   Alkaline Phosphatase 298 (H) 38 - 126 U/L   Total Bilirubin 1.0 0.3 - 1.2 mg/dL   Bilirubin, Direct 0.4 (H) 0.0 - 0.2 mg/dL   Indirect Bilirubin 0.6 0.3 - 0.9 mg/dL    Comment: Performed at [facility], [place] [address], [place], [place] [address]  CBC with Differential/Platelet     Status: Abnormal   Collection Time: 04/15/18  7:50 AM  Result Value Ref Range   WBC 10.2 4.0 - 10.5 K/uL   RBC 3.45 (L) 3.87 - 5.11 MIL/uL   Hemoglobin 10.0 (L) 12.0 - 15.0 g/dL   HCT 30.0 (L) 36.0 - 46.0 %   MCV 87.0 78.0 - 100.0 fL   MCH 29.0 26.0 - 34.0 pg   MCHC 33.3 30.0 - 36.0 g/dL   RDW 15.9 (H) 11.5 - 15.5 %   Platelets 282 150 - 400 K/uL   Neutrophils Relative % 84 %   Neutro Abs 8.6 (H) 1.7 - 7.7 K/uL   Lymphocytes Relative 10 %   Lymphs Abs 1.1 0.7 - 4.0 K/uL   Monocytes Relative 6 %   Monocytes Absolute 0.6 0.1 - 1.0 K/uL   Eosinophils Relative 0 %   Eosinophils Absolute 0.0 0.0 - 0.7 K/uL   Basophils Relative 0 %   Basophils Absolute 0.0 0.0 - 0.1 K/uL    Comment: Performed at [facility], [place] [address] [place], [place] [address]  Culture, blood (routine x 2)     Status: None   Collection Time: 04/15/18  2:30 PM  Result Value Ref Range   Specimen Description      BLOOD RIGHT ANTECUBITAL Performed at [place] [address], [place], [place] [address]    Special Requests      BOTTLES DRAWN  AEROBIC AND ANAEROBIC Blood Culture adequate volume Performed at [place] [address], [place], [place] [address]    Culture      NO GROWTH 5 DAYS Performed at [facility], [place] [address], [place], [place] [address]    Report Status 04/20/2018 FINAL   Culture, blood (routine x 2)  Status: None   Collection Time: 04/15/18  2:33 PM  Result Value Ref Range   Specimen Description      BLOOD RIGHT HAND Performed at [facility], [place] [address] [place], [place] [address]    Special Requests      BOTTLES DRAWN AEROBIC AND ANAEROBIC Blood Culture adequate volume Performed at [place] [address] [place], [place] [address]    Culture      NO GROWTH 5 DAYS Performed at [facility], [place] [address], [place], [place] [address]    Report Status 04/20/2018 FINAL   Basic metabolic panel     Status: Abnormal   Collection Time: 04/16/18  3:50 AM  Result Value Ref Range   Sodium 125 (L) 135 - 145 mmol/L   Potassium 2.3 (LL) 3.5 - 5.1 mmol/L    Comment: DELTA CHECK NOTED REPEATED TO VERIFY CRITICAL RESULT CALLED TO, READ BACK BY AND VERIFIED WITHBettina Gavia RN [id number] 04/16/18 A NAVARRO    Chloride 87 (L) 98 - 111 mmol/L   CO2 26 22 - 32 mmol/L   Glucose, Bld 91 70 - 99 mg/dL   BUN <5 (L) 8 - 23 mg/dL   Creatinine, Ser 0.36 (L) 0.44 - 1.00 mg/dL   Calcium 7.5 (L) 8.9 - 10.3 mg/dL   GFR calc non Af Amer >60 >60 mL/min   GFR calc Af Amer >60 >60 mL/min    Comment: (NOTE) The eGFR has been calculated using the CKD EPI equation. This calculation has not been validated in all clinical situations. eGFR's persistently <60 mL/min signify possible Chronic Kidney Disease.    Anion gap 12 5 - 15    Comment: Performed at [facility], [place] [address] [place], [place] [address]  Surgical pcr screen     Status: None   Collection Time: 04/16/18  8:42 AM  Result Value Ref Range   MRSA, PCR  NEGATIVE NEGATIVE   Staphylococcus aureus NEGATIVE NEGATIVE    Comment: (NOTE) The Xpert SA Assay (FDA approved for NASAL specimens in patients 24 years of age and older), is one component of a comprehensive surveillance program. It is not intended to diagnose infection nor to guide or monitor treatment. Performed at [facility], [place] [address] [place], [place] [address]   POCT I-Stat EG7     Status: Abnormal   Collection Time: 04/16/18  2:14 PM  Result Value Ref Range   pH, Ven 7.481 (H) 7.250 - 7.430   pCO2, Ven 32.1 (L) 44.0 - 60.0 mmHg   pO2, Ven 40.0 32.0 - 45.0 mmHg   Bicarbonate 24.0 20.0 - 28.0 mmol/L   TCO2 25 22 - 32 mmol/L   O2 Saturation 79.0 %   Acid-Base Excess 1.0 0.0 - 2.0 mmol/L   Sodium 125 (L) 135 - 145 mmol/L   Potassium 3.7 3.5 - 5.1 mmol/L   Calcium, Ion 1.06 (L) 1.15 - 1.40 mmol/L   HCT 35.0 (L) 36.0 - 46.0 %   Hemoglobin 11.9 (L) 12.0 - 15.0 g/dL   Patient temperature 37.0 C    Sample type VENOUS   Basic metabolic panel     Status: Abnormal   Collection Time: 04/16/18  3:36 PM  Result Value Ref Range   Sodium 127 (L) 135 - 145 mmol/L   Potassium 3.5 3.5 - 5.1 mmol/L   Chloride 90 (L) 98 - 111 mmol/L   CO2 25 22 - 32 mmol/L  Glucose, Bld 108 (H) 70 - 99 mg/dL   BUN <5 (L) 8 - 23 mg/dL   Creatinine, Ser 0.37 (L) 0.44 - 1.00 mg/dL   Calcium 8.1 (L) 8.9 - 10.3 mg/dL   GFR calc non Af Amer >60 >60 mL/min   GFR calc Af Amer >60 >60 mL/min    Comment: (NOTE) The eGFR has been calculated using the CKD EPI equation. This calculation has not been validated in all clinical situations. eGFR's persistently <60 mL/min signify possible Chronic Kidney Disease.    Anion gap 12 5 - 15    Comment: Performed at [facility], [place] [address] [place], [place] [address]  CBC     Status: Abnormal   Collection Time: 04/17/18  9:59 AM  Result Value Ref Range   WBC 6.3 4.0 - 10.5 K/uL   RBC 4.05 3.87 - 5.11 MIL/uL   Hemoglobin  11.7 (L) 12.0 - 15.0 g/dL   HCT 34.3 (L) 36.0 - 46.0 %   MCV 84.7 78.0 - 100.0 fL   MCH 28.9 26.0 - 34.0 pg   MCHC 34.1 30.0 - 36.0 g/dL   RDW 15.1 11.5 - 15.5 %   Platelets 396 150 - 400 K/uL    Comment: Performed at [facility], [place] [address] [place], [place] [address]  Basic metabolic panel     Status: Abnormal   Collection Time: 04/17/18  9:59 AM  Result Value Ref Range   Sodium 124 (L) 135 - 145 mmol/L   Potassium 3.4 (L) 3.5 - 5.1 mmol/L   Chloride 91 (L) 98 - 111 mmol/L   CO2 23 22 - 32 mmol/L   Glucose, Bld 106 (H) 70 - 99 mg/dL   BUN 7 (L) 8 - 23 mg/dL   Creatinine, Ser <0.30 (L) 0.44 - 1.00 mg/dL   Calcium 7.7 (L) 8.9 - 10.3 mg/dL   GFR calc non Af Amer NOT CALCULATED >60 mL/min   GFR calc Af Amer NOT CALCULATED >60 mL/min    Comment: (NOTE) The eGFR has been calculated using the CKD EPI equation. This calculation has not been validated in all clinical situations. eGFR's persistently <60 mL/min signify possible Chronic Kidney Disease.    Anion gap 10 5 - 15    Comment: Performed at [facility], [place] [address], [place], [place] [address]  Basic metabolic panel     Status: Abnormal   Collection Time: 04/17/18  4:51 PM  Result Value Ref Range   Sodium 124 (L) 135 - 145 mmol/L   Potassium 2.8 (L) 3.5 - 5.1 mmol/L    Comment: DELTA CHECK NOTED   Chloride 91 (L) 98 - 111 mmol/L   CO2 22 22 - 32 mmol/L   Glucose, Bld 152 (H) 70 - 99 mg/dL   BUN 7 (L) 8 - 23 mg/dL   Creatinine, Ser 0.33 (L) 0.44 - 1.00 mg/dL   Calcium 7.5 (L) 8.9 - 10.3 mg/dL   GFR calc non Af Amer >60 >60 mL/min   GFR calc Af Amer >60 >60 mL/min    Comment: (NOTE) The eGFR has been calculated using the CKD EPI equation. This calculation has not been validated in all clinical situations. eGFR's persistently <60 mL/min signify possible Chronic Kidney Disease.    Anion gap 11 5 - 15    Comment: Performed at [facility], [place] [address], [place], [place] [address]  Magnesium     Status: None   Collection Time:  04/17/18  4:51 PM  Result Value Ref Range   Magnesium 1.7 1.7 - 2.4 mg/dL    Comment: Performed at [facility], [place] [address], [place], [place] [address]  Basic metabolic panel     Status: Abnormal   Collection Time: 04/17/18  9:52 PM  Result Value Ref Range   Sodium 123 (L) 135 - 145 mmol/L   Potassium 3.2 (L) 3.5 - 5.1 mmol/L   Chloride 91 (L) 98 - 111 mmol/L   CO2 22 22 - 32 mmol/L   Glucose, Bld 195 (H) 70 - 99 mg/dL   BUN 6 (L) 8 - 23 mg/dL   Creatinine, Ser 0.38 (L) 0.44 - 1.00 mg/dL   Calcium 7.4 (L) 8.9 - 10.3 mg/dL   GFR calc non Af Amer >60 >60 mL/min   GFR calc Af Amer >60 >60 mL/min    Comment: (NOTE) The eGFR has been calculated using the CKD EPI equation. This calculation has not been validated in all clinical situations. eGFR's persistently <60 mL/min signify possible Chronic Kidney Disease.    Anion gap 10 5 - 15    Comment: Performed at [facility], [place] [address] [place], [place] [address]  Basic metabolic panel     Status: Abnormal   Collection Time: 04/18/18  5:11 AM  Result Value Ref Range   Sodium 125 (L) 135 - 145 mmol/L   Potassium 4.0 3.5 - 5.1 mmol/L    Comment: DELTA CHECK NOTED NO VISIBLE HEMOLYSIS    Chloride 96 (L) 98 - 111 mmol/L   CO2 23 22 - 32 mmol/L   Glucose, Bld 74 70 - 99 mg/dL   BUN <5 (L) 8 - 23 mg/dL   Creatinine, Ser 0.31 (L) 0.44 - 1.00 mg/dL   Calcium 7.2 (L) 8.9 - 10.3 mg/dL   GFR calc non Af Amer >60 >60 mL/min   GFR calc Af Amer >60 >60 mL/min    Comment: (NOTE) The eGFR has been calculated using the CKD EPI equation. This calculation has not been validated in all clinical situations. eGFR's persistently <60 mL/min signify possible Chronic Kidney Disease.    Anion gap 6 5 - 15    Comment: Performed at [facility], [place] [address] [place], [place] [address]  Basic metabolic panel      Status: Abnormal   Collection Time: 04/18/18  2:32 PM  Result Value Ref Range   Sodium 130 (L) 135 - 145 mmol/L   Potassium 3.5 3.5 - 5.1 mmol/L   Chloride 94 (L) 98 - 111 mmol/L   CO2 26 22 - 32 mmol/L   Glucose, Bld 89 70 - 99 mg/dL   BUN 6 (L) 8 - 23 mg/dL   Creatinine, Ser 0.44 0.44 - 1.00 mg/dL   Calcium 7.7 (L) 8.9 - 10.3 mg/dL   GFR calc non Af Amer >60 >60 mL/min   GFR calc Af Amer >60 >60 mL/min    Comment: (NOTE) The eGFR has been calculated using the CKD EPI equation. This calculation has not been validated in all clinical situations. eGFR's persistently <60 mL/min signify possible Chronic Kidney Disease.    Anion gap 10 5 - 15    Comment: Performed at [facility], [place] [address] [place],  [address]  Urinalysis, Routine w reflex microscopic     Status: Abnormal   Collection Time: 04/23/18 10:01 AM  Result Value Ref Range   Color, Urine YELLOW YELLOW   APPearance CLOUDY (  A) CLEAR   Specific Gravity, Urine 1.034 1.001 - 1.03    Comment: Verified by repeat analysis. .    pH 5.5 5.0 - 8.0   Glucose, UA NEGATIVE NEGATIVE   Bilirubin Urine NEGATIVE NEGATIVE    Comment: Verified by repeat analysis. [place] Kitchen    Ketones, ur 1+ (A) NEGATIVE   Hgb urine dipstick TRACE (A) NEGATIVE   Protein, ur 2+ (A) NEGATIVE   Nitrite NEGATIVE NEGATIVE   Leukocytes, UA TRACE (A) NEGATIVE   WBC, UA CANCELED     Comment: Test not performed. Quantity not sufficient. .  Result canceled by the ancillary.   Microscopic Message     Status: None   Collection Time: 04/23/18 10:01 AM  Result Value Ref Range   Note      Comment: This urine was analyzed for the presence of WBC,  RBC, bacteria, casts, and other formed elements.  Only those elements seen were reported. . .   Urine Culture     Status: None   Collection Time: 04/23/18 10:57 AM  Result Value Ref Range   MICRO NUMBER: [id number]    SPECIMEN QUALITY: ADEQUATE    Sample Source URINE    STATUS: FINAL     Result: No Growth   CMP (Cancer Center only)     Status: Abnormal   Collection Time: 04/30/18  9:36 AM  Result Value Ref Range   Sodium 117 (L) 135 - 145 mmol/L   Potassium 4.7 3.5 - 5.1 mmol/L   Chloride 84 (L) 98 - 111 mmol/L   CO2 23 22 - 32 mmol/L   Glucose, Bld 88 70 - 99 mg/dL   BUN 10 8 - 23 mg/dL   Creatinine 0.69 0.44 - 1.00 mg/dL   Calcium 9.0 8.9 - 10.3 mg/dL   Total Protein 6.9 6.5 - 8.1 g/dL   Albumin 3.3 (L) 3.5 - 5.0 g/dL   AST 23 15 - 41 U/L   ALT 24 0 - 44 U/L   Alkaline Phosphatase 187 (H) 38 - 126 U/L   Total Bilirubin 0.9 0.3 - 1.2 mg/dL   GFR, Est Non Af Am >60 >60 mL/min   GFR, Est AFR Am >60 >60 mL/min    Comment: (NOTE) The eGFR has been calculated using the CKD EPI equation. This calculation has not been validated in all clinical situations. eGFR's persistently <60 mL/min signify possible Chronic Kidney Disease.    Anion gap 10 5 - 15    Comment: Performed at [facility], [address], [place], [place] [address]  CBC with Differential ([place] Only)     Status: Abnormal   Collection Time: 04/30/18  9:36 AM  Result Value Ref Range   WBC Count 6.6 3.9 - 10.3 K/uL   RBC 4.17 3.70 - 5.45 MIL/uL   Hemoglobin 11.8 11.6 - 15.9 g/dL   HCT 35.2 34.8 - 46.6 %   MCV 84.5 79.5 - 101.0 fL   MCH 28.4 25.1 - 34.0 pg   MCHC 33.6 31.5 - 36.0 g/dL   RDW 16.7 (H) 11.2 - 14.5 %   Platelet Count 415 (H) 145 - 400 K/uL   Neutrophils Relative % 71 %   Neutro Abs 4.7 1.5 - 6.5 K/uL   Lymphocytes Relative 17 %   Lymphs Abs 1.1 0.9 - 3.3 K/uL   Monocytes Relative 11 %   Monocytes Absolute 0.7 0.1 - 0.9 K/uL   Eosinophils Relative 0 %   Eosinophils Absolute 0.0 0.0 -  0.5 K/uL   Basophils Relative 1 %   Basophils Absolute 0.1 0.0 - 0.1 K/uL    Comment: Performed at [facility], [place] [address], [place], [place] [address]  Phosphorus     Status: None   Collection Time: 05/01/18  9:49 AM  Result Value Ref  Range   Phosphorus 3.4 2.5 - 4.6 mg/dL    Comment: Performed at [facility], [place] [address], [place], [place] [address]  Magnesium     Status: Abnormal   Collection Time: 05/01/18  9:49 AM  Result Value Ref Range   Magnesium 1.6 (L) 1.7 - 2.4 mg/dL    Comment: Performed at [facility], [place] [address] [place], [place] [address]  Basic Metabolic Panel - Cancer Center Only     Status: Abnormal   Collection Time: 05/01/18  9:49 AM  Result Value Ref Range   Sodium 116 (L) 135 - 145 mmol/L   Potassium 4.1 3.5 - 5.1 mmol/L   Chloride 84 (L) 98 - 111 mmol/L   CO2 23 22 - 32 mmol/L   Glucose, Bld 90 70 - 99 mg/dL   BUN 6 (L) 8 - 23 mg/dL   Creatinine 0.55 0.44 - 1.00 mg/dL   Calcium 8.8 (L) 8.9 - 10.3 mg/dL   GFR, Est Non Af Am >60 >60 mL/min   GFR, Est AFR Am >60 >60 mL/min    Comment: (NOTE) The eGFR has been calculated using the CKD EPI equation. This calculation has not been validated in all clinical situations. eGFR's persistently <60 mL/min signify possible Chronic Kidney Disease.    Anion gap 9 5 - 15    Comment: Performed at [facility], [address], [place], [place] [address]  TSH     Status: None   Collection Time: 05/01/18  9:49 AM  Result Value Ref Range   TSH 1.115 0.308 - 3.960 uIU/mL    Comment: Performed at [facility], [place] [address], [name], [place] [address]  CBC with Differential/Platelet     Status: Abnormal   Collection Time: 05/01/18  9:49 AM  Result Value Ref Range   WBC 7.4 3.9 - 10.3 K/uL   RBC 3.91 3.70 - 5.45 MIL/uL   Hemoglobin 11.1 (L) 11.6 - 15.9 g/dL   HCT 32.8 (L) 34.8 - 46.6 %   MCV 83.9 79.5 - 101.0 fL   MCH 28.4 25.1 - 34.0 pg   MCHC 33.9 31.5 - 36.0 g/dL   RDW 16.5 (H) 11.2 - 14.5 %   Platelets 374 145 - 400 K/uL   Neutrophils Relative % 84 %   Neutro Abs 6.2 1.5 - 6.5 K/uL   Lymphocytes Relative 8 %   Lymphs Abs 0.6 (L) 0.9 - 3.3 K/uL    Monocytes Relative 8 %   Monocytes Absolute 0.6 0.1 - 0.9 K/uL   Eosinophils Relative 0 %   Eosinophils Absolute 0.0 0.0 - 0.5 K/uL   Basophils Relative 0 %   Basophils Absolute 0.0 0.0 - 0.1 K/uL    Comment: Performed at [facility], [place] [address], [place], [place] [address]  Basic metabolic panel     Status: Abnormal   Collection Time: 05/01/18  5:36 PM  Result Value Ref Range   Sodium 119 (LL) 135 - 145 mmol/L    Comment: CRITICAL RESULT CALLED TO, READ BACK BY AND VERIFIED WITH: LONG,M. RN @1824  ON 09.06.19 BY COHEN,K  Potassium 3.8 3.5 - 5.1 mmol/L   Chloride 86 (L) 98 - 111 mmol/L   CO2 25 22 - 32 mmol/L   Glucose, Bld 118 (H) 70 - 99 mg/dL   BUN 6 (L) 8 - 23 mg/dL   Creatinine, Ser <0.30 (L) 0.44 - 1.00 mg/dL   Calcium 8.2 (L) 8.9 - 10.3 mg/dL   GFR calc non Af Amer NOT CALCULATED >60 mL/min   GFR calc Af Amer NOT CALCULATED >60 mL/min    Comment: (NOTE) The eGFR has been calculated using the CKD EPI equation. This calculation has not been validated in all clinical situations. eGFR's persistently <60 mL/min signify possible Chronic Kidney Disease.    Anion gap 8 5 - 15    Comment: Performed at [facility], [place] [address] [place], [place] [address]  Basic metabolic panel     Status: Abnormal   Collection Time: 05/02/18  4:04 AM  Result Value Ref Range   Sodium 120 (L) 135 - 145 mmol/L   Potassium 2.8 (L) 3.5 - 5.1 mmol/L    Comment: DELTA CHECK NOTED   Chloride 86 (L) 98 - 111 mmol/L   CO2 23 22 - 32 mmol/L   Glucose, Bld 75 70 - 99 mg/dL   BUN <5 (L) 8 - 23 mg/dL   Creatinine, Ser <0.30 (L) 0.44 - 1.00 mg/dL   Calcium 7.5 (L) 8.9 - 10.3 mg/dL   GFR calc non Af Amer NOT CALCULATED >60 mL/min   GFR calc Af Amer NOT CALCULATED >60 mL/min    Comment: (NOTE) The eGFR has been calculated using the CKD EPI equation. This calculation has not been validated in all clinical situations. eGFR's persistently <60 mL/min  signify possible Chronic Kidney Disease.    Anion gap 11 5 - 15    Comment: Performed at [facility], [place] [address] [place], [place] [address]  CBC     Status: Abnormal   Collection Time: 05/02/18  4:04 AM  Result Value Ref Range   WBC 6.5 4.0 - 10.5 K/uL   RBC 3.56 (L) 3.87 - 5.11 MIL/uL   Hemoglobin 10.2 (L) 12.0 - 15.0 g/dL   HCT 29.0 (L) 36.0 - 46.0 %   MCV 81.5 78.0 - 100.0 fL   MCH 28.7 26.0 - 34.0 pg   MCHC 35.2 30.0 - 36.0 g/dL   RDW 15.0 11.5 - 15.5 %   Platelets 354 150 - 400 K/uL    Comment: Performed at [facility], [place] [address], [place], [place] [address]  Magnesium     Status: None   Collection Time: 05/02/18  4:04 AM  Result Value Ref Range   Magnesium 1.9 1.7 - 2.4 mg/dL    Comment: Performed at [facility], [place] [address], [place], [place] [address]  Basic metabolic panel     Status: Abnormal   Collection Time: 05/02/18  3:44 PM  Result Value Ref Range   Sodium 121 (L) 135 - 145 mmol/L   Potassium 4.1 3.5 - 5.1 mmol/L    Comment: DELTA CHECK NOTED REPEATED TO VERIFY NO VISIBLE HEMOLYSIS    Chloride 90 (L) 98 - 111 mmol/L   CO2 22 22 - 32 mmol/L   Glucose, Bld 119 (H) 70 - 99 mg/dL   BUN <5 (L) 8 - 23 mg/dL   Creatinine, Ser 0.34 (L) 0.44 - 1.00 mg/dL   Calcium 7.8 (L) 8.9 - 10.3 mg/dL   GFR calc non Af  Amer >60 >60 mL/min   GFR calc Af Amer >60 >60 mL/min    Comment: (NOTE) The eGFR has been calculated using the CKD EPI equation. This calculation has not been validated in all clinical situations. eGFR's persistently <60 mL/min signify possible Chronic Kidney Disease.    Anion gap 9 5 - 15    Comment: Performed at [facility], [place] [address], [place], [place] [address]  Basic metabolic panel     Status: Abnormal   Collection Time: 05/03/18  3:43 AM  Result Value Ref Range   Sodium 124 (L) 135 - 145 mmol/L   Potassium 2.8 (L) 3.5 - 5.1 mmol/L    Comment: DELTA CHECK  NOTED   Chloride 91 (L) 98 - 111 mmol/L   CO2 23 22 - 32 mmol/L   Glucose, Bld 75 70 - 99 mg/dL   BUN <5 (L) 8 - 23 mg/dL   Creatinine, Ser 0.31 (L) 0.44 - 1.00 mg/dL   Calcium 7.5 (L) 8.9 - 10.3 mg/dL   GFR calc non Af Amer >60 >60 mL/min   GFR calc Af Amer >60 >60 mL/min    Comment: (NOTE) The eGFR has been calculated using the CKD EPI equation. This calculation has not been validated in all clinical situations. eGFR's persistently <60 mL/min signify possible Chronic Kidney Disease.    Anion gap 10 5 - 15    Comment: Performed at [facility] [place] [address], [place], [place] [address]  Magnesium     Status: None   Collection Time: 05/03/18  3:43 AM  Result Value Ref Range   Magnesium 1.8 1.7 - 2.4 mg/dL    Comment: Performed at [facility], [place] [address], [place], [place] [address]  Basic metabolic panel     Status: Abnormal   Collection Time: 05/03/18  3:57 PM  Result Value Ref Range   Sodium 122 (L) 135 - 145 mmol/L   Potassium 4.5 3.5 - 5.1 mmol/L    Comment: DELTA CHECK NOTED NO VISIBLE HEMOLYSIS REPEATED TO VERIFY    Chloride 92 (L) 98 - 111 mmol/L   CO2 22 22 - 32 mmol/L   Glucose, Bld 166 (H) 70 - 99 mg/dL   BUN <5 (L) 8 - 23 mg/dL   Creatinine, Ser 0.38 (L) 0.44 - 1.00 mg/dL   Calcium 8.2 (L) 8.9 - 10.3 mg/dL   GFR calc non Af Amer >60 >60 mL/min   GFR calc Af Amer >60 >60 mL/min    Comment: (NOTE) The eGFR has been calculated using the CKD EPI equation. This calculation has not been validated in all clinical situations. eGFR's persistently <60 mL/min signify possible Chronic Kidney Disease.    Anion gap 8 5 - 15    Comment: Performed at [facility], [place] [address] [place], [place] [address]  Basic metabolic panel     Status: Abnormal   Collection Time: 05/04/18  3:48 AM  Result Value Ref Range   Sodium 127 (L) 135 - 145 mmol/L   Potassium 3.4 (L) 3.5 - 5.1 mmol/L    Comment: DELTA CHECK NOTED    Chloride 94 (L) 98 - 111 mmol/L   CO2 24 22 - 32 mmol/L   Glucose, Bld 82 70 - 99 mg/dL   BUN <5 (L) 8 - 23 mg/dL   Creatinine, Ser 0.33 (L) 0.44 - 1.00 mg/dL   Calcium 7.7 (L) 8.9 - 10.3 mg/dL   GFR calc non Af Amer >60 >  60 mL/min   GFR calc Af Amer >60 >60 mL/min    Comment: (NOTE) The eGFR has been calculated using the CKD EPI equation. This calculation has not been validated in all clinical situations. eGFR's persistently <60 mL/min signify possible Chronic Kidney Disease.    Anion gap 9 5 - 15    Comment: Performed at [facility], [place] [address], [place], [place] [address]  Cortisol-am, blood     Status: None   Collection Time: 05/04/18  3:48 AM  Result Value Ref Range   Cortisol - AM 10.6 6.7 - 22.6 ug/dL    Comment: Performed at [facility], [place] [address] [place], [place] [address]  Basic metabolic panel     Status: Abnormal   Collection Time: 05/04/18  3:51 PM  Result Value Ref Range   Sodium 128 (L) 135 - 145 mmol/L   Potassium 3.5 3.5 - 5.1 mmol/L   Chloride 96 (L) 98 - 111 mmol/L   CO2 23 22 - 32 mmol/L   Glucose, Bld 150 (H) 70 - 99 mg/dL   BUN <5 (L) 8 - 23 mg/dL   Creatinine, Ser 0.42 (L) 0.44 - 1.00 mg/dL   Calcium 7.6 (L) 8.9 - 10.3 mg/dL   GFR calc non Af Amer >60 >60 mL/min   GFR calc Af Amer >60 >60 mL/min    Comment: (NOTE) The eGFR has been calculated using the CKD EPI equation. This calculation has not been validated in all clinical situations. eGFR's persistently <60 mL/min signify possible Chronic Kidney Disease.    Anion gap 9 5 - 15    Comment: Performed at [facility], [place] [address] [place], [place] [address]  Basic metabolic panel     Status: Abnormal   Collection Time: 05/05/18  3:36 AM  Result Value Ref Range   Sodium 130 (L) 135 - 145 mmol/L   Potassium 2.6 (LL) 3.5 - 5.1 mmol/L    Comment: DELTA CHECK NOTED REPEATED TO VERIFY CRITICAL RESULT CALLED TO, READ BACK BY AND VERIFIED  WITH: K RYAN,RN @0532  05/05/18 MKELLY    Chloride 95 (L) 98 - 111 mmol/L   CO2 25 22 - 32 mmol/L   Glucose, Bld 74 70 - 99 mg/dL   BUN <5 (L) 8 - 23 mg/dL   Creatinine, Ser <0.30 (L) 0.44 - 1.00 mg/dL   Calcium 8.0 (L) 8.9 - 10.3 mg/dL   GFR calc non Af Amer NOT CALCULATED >60 mL/min   GFR calc Af Amer NOT CALCULATED >60 mL/min    Comment: (NOTE) The eGFR has been calculated using the CKD EPI equation. This calculation has not been validated in all clinical situations. eGFR's persistently <60 mL/min signify possible Chronic Kidney Disease.    Anion gap 10 5 - 15    Comment: Performed at [facility], [place] [address] [place], [place] [address]  Magnesium     Status: None   Collection Time: 05/05/18  3:36 AM  Result Value Ref Range   Magnesium 1.8 1.7 - 2.4 mg/dL    Comment: Performed at [facility], [place] [address] [place], [place] [address]  Basic metabolic panel     Status: Abnormal   Collection Time: 05/08/18  2:21 PM  Result Value Ref Range   Sodium 128 (L) 135 - 145 mEq/L   Potassium 5.0 3.5 - 5.1 mEq/L   Chloride 95 (L) 96 - 112 mEq/L   CO2 25 19 - 32 mEq/L  Glucose, Bld 124 (H) 70 - 99 mg/dL   BUN 8 6 - 23 mg/dL   Creatinine, Ser 0.45 0.40 - 1.20 mg/dL   Calcium 9.0 8.4 - 10.5 mg/dL   GFR 148.28 >60.00 mL/min  Lactate dehydrogenase     Status: None   Collection Time: 05/15/18  1:52 PM  Result Value Ref Range   LDH 166 98 - 192 U/L    Comment: Performed at [facility], [place] [address] [place], [place] [address]  Magnesium     Status: None   Collection Time: 05/15/18  1:52 PM  Result Value Ref Range   Magnesium 2.1 1.7 - 2.4 mg/dL    Comment: Performed at [facility], [place] [address], [place], [place] [address]  CMP ([place] only)     Status: Abnormal   Collection Time: 05/15/18  1:52 PM  Result Value Ref Range   Sodium 141 135 - 145 mmol/L   Potassium 3.7 3.5 - 5.1  mmol/L   Chloride 102 98 - 111 mmol/L   CO2 28 22 - 32 mmol/L   Glucose, Bld 74 70 - 99 mg/dL   BUN 15 8 - 23 mg/dL   Creatinine 0.58 0.44 - 1.00 mg/dL   Calcium 9.6 8.9 - 10.3 mg/dL   Total Protein 6.7 6.5 - 8.1 g/dL   Albumin 3.2 (L) 3.5 - 5.0 g/dL   AST 18 15 - 41 U/L   ALT 13 0 - 44 U/L   Alkaline Phosphatase 128 (H) 38 - 126 U/L   Total Bilirubin 0.5 0.3 - 1.2 mg/dL   GFR, Est Non Af Am >60 >60 mL/min   GFR, Est AFR Am >60 >60 mL/min    Comment: (NOTE) The eGFR has been calculated using the CKD EPI equation. This calculation has not been validated in all clinical situations. eGFR's persistently <60 mL/min signify possible Chronic Kidney Disease.    Anion gap 11 5 - 15    Comment: Performed at [facility], [address] [place], [place] [address]  CBC with Differential/Platelet     Status: Abnormal   Collection Time: 05/15/18  1:52 PM  Result Value Ref Range   WBC 6.5 3.9 - 10.3 K/uL   RBC 3.86 3.70 - 5.45 MIL/uL   Hemoglobin 10.7 (L) 11.6 - 15.9 g/dL   HCT 33.8 (L) 34.8 - 46.6 %   MCV 87.6 79.5 - 101.0 fL   MCH 27.7 25.1 - 34.0 pg   MCHC 31.7 31.5 - 36.0 g/dL   RDW 16.0 (H) 11.2 - 14.5 %   Platelets 322 145 - 400 K/uL   Neutrophils Relative % 71 %   Neutro Abs 4.6 1.5 - 6.5 K/uL   Lymphocytes Relative 19 %   Lymphs Abs 1.2 0.9 - 3.3 K/uL   Monocytes Relative 10 %   Monocytes Absolute 0.7 0.1 - 0.9 K/uL   Eosinophils Relative 0 %   Eosinophils Absolute 0.0 0.0 - 0.5 K/uL   Basophils Relative 0 %   Basophils Absolute 0.0 0.0 - 0.1 K/uL    Comment: Performed at [facility], [place] [address] [place], [place] [address]   No results found for: HGBA1C, MPG No results found for: PROLACTIN Lab Results  Component Value Date   CHOL 259 (H) 02/02/2018   TRIG 176 (H) 02/02/2018   HDL 67 02/02/2018   CHOLHDL 3.9 02/02/2018   VLDL 22 05/03/2016   LDLCALC 159 (H)  02/02/2018   [place] 66 07/21/2017     Current  Medications: Current Outpatient Medications  Medication Sig Dispense Refill  . calcium carbonate (OS-CAL - DOSED IN MG OF ELEMENTAL CALCIUM) 1250 (500 Ca) MG tablet Take 1 tablet by mouth daily.     . cholecalciferol (VITAMIN D) 1000 units tablet Take 1,000 Units by mouth daily.    . famotidine (PEPCID AC) 10 MG chewable tablet Chew 10 mg by mouth 2 (two) times daily.    . hydrocortisone (CORTEF) 10 MG tablet Take 2 tablets (20 mg total) by mouth daily with breakfast. And 71m po daily with lunch. (Patient taking differently: Take 10-20 mg by mouth See admin instructions. 20 mg every morning And 120mwith lunch.) 90 tablet 1  . KLOR-CON M20 20 MEQ tablet TAKE 1 TABLET (20 MEQ TOTAL) BY MOUTH DAILY. TAKE FOR 7 DAYS THEN REPEAT LABS. 90 tablet 1  . Multiple Vitamins-Minerals (MULTIVITAMIN WITH MINERALS) tablet Take 1 tablet by mouth daily.    . ondansetron (ZOFRAN) 4 MG tablet Take 1 tablet (4 mg total) by mouth every 8 (eight) hours as needed for nausea or vomiting. 20 tablet 0  . oxyCODONE (OXY IR/ROXICODONE) 5 MG immediate release tablet Take 1-2 tablets (5-10 mg total) by mouth every 4 (four) hours as needed for moderate pain or severe pain. 50 tablet 0  . polyethylene glycol (MIRALAX) packet Take 17 g by mouth daily as needed for moderate constipation. 30 each 0  . senna-docusate (SENNA S) 8.6-50 MG tablet Take 2 tablets by mouth at bedtime. (Patient taking differently: Take 2 tablets by mouth at bedtime as needed for mild constipation or moderate constipation. ) 60 tablet 1  . sodium chloride 1 g tablet Take 5 tablets (5 g total) by mouth 2 (two) times daily with a meal. 150 tablet 5  . dronabinol (MARINOL) 2.5 MG capsule Take 1 capsule (2.5 mg total) by mouth 2 (two) times daily before lunch and supper. (Patient not taking: Reported on 06/02/2018) 60 capsule 0  . metoCLOPramide (REGLAN) 10 MG tablet Take 1 tablet (10 mg total) by mouth every 8 (eight) hours as needed for nausea. (Patient not  taking: Reported on 05/21/2018) 45 tablet 1   No current facility-administered medications for this visit.    Facility-Administered Medications Ordered in Other Visits  Medication Dose Route Frequency Provider Last Rate Last Dose  . acetaminophen (TYLENOL) tablet 650 mg  650 mg Oral Once MoCurt BearsMD      . diphenhydrAMINE (BENADRYL) capsule 25 mg  25 mg Oral Once MoCurt BearsMD        Neurologic: Headache: No Seizure: No Paresthesias:No  Musculoskeletal: Strength & Muscle Tone: decreased Gait & Station: unsteady Patient leans: N/A  Psychiatric Specialty Exam: ROS  Blood pressure 118/64, pulse 70, height 5' 3"  (1.6 m), weight 113 lb (51.3 kg).Body mass index is 20.02 kg/m.  General Appearance: Casual and Tired and tearful  Eye Contact:  Fair  Speech:  Slow  Volume:  Decreased  Mood:  Anxious, Depressed, Dysphoric and Hopeless  Affect:  Congruent, Constricted and Depressed  Thought Process:  Goal Directed  Orientation:  Full (Time, Place, and Person)  Thought Content:  Rumination  Suicidal Thoughts:  No  Homicidal Thoughts:  No  Memory:  Immediate;   Fair Recent;   Fair Remote;   Good  Judgement:  Good  Insight:  Good  Psychomotor Activity:  Decreased  Concentration:  Concentration: Fair and Attention Span: Fair  Recall:  Fair  Fund of Knowledge:Good  Language: Good  Akathisia:  No  Handed:  Right  AIMS (if indicated):  0  Assets:  Communication Skills Desire for Improvement Housing Social Support  ADL's:  Intact  Cognition: WNL  Sleep: Fair    Treatment Plan Summary: Correne is a 67 year old Caucasian divorced female who is referred from her oncologist for the management of anxiety and depression.   Diagnosis; Major depressive disorder, single episode moderate.  Anxiety disorder NOS.  Mood disorder due to general medical condition.  Psychosocial stressors, collateral information, recent blood work results and current medication.  Recommended  to try low-dose Cymbalta to help with anxiety and depression.  We discussed side effect especially in the beginning can cause nausea and GI side effects.  We will start Cymbalta 20 mg daily for 2 weeks and then 40 mg daily.  I recommended to call us back if she has any question, concern or if she feels worsening of the symptoms.  Discussed safety concerns at any time having active suicidal thoughts or homicidal thought that she need to call 911 or go to local emergency room.  I also believe she should see a therapist for CBT and supportive therapy.  We will schedule appointment to see a therapist in this office.  I will see her again in 4 to 6 weeks.  Kathlee Nations, MD 10/8/201911:30 AM

## 2018-06-03 ENCOUNTER — Ambulatory Visit: Payer: Medicare Other | Admitting: Radiation Oncology

## 2018-06-04 ENCOUNTER — Encounter: Payer: Self-pay | Admitting: Hematology

## 2018-06-04 ENCOUNTER — Inpatient Hospital Stay: Payer: Medicare Other

## 2018-06-04 ENCOUNTER — Inpatient Hospital Stay: Payer: Medicare Other | Attending: Hematology | Admitting: Hematology

## 2018-06-04 VITALS — BP 126/83 | HR 80 | Temp 98.2°F | Resp 18 | Ht 63.0 in | Wt 112.4 lb

## 2018-06-04 DIAGNOSIS — K5903 Drug induced constipation: Secondary | ICD-10-CM | POA: Diagnosis not present

## 2018-06-04 DIAGNOSIS — E876 Hypokalemia: Secondary | ICD-10-CM | POA: Insufficient documentation

## 2018-06-04 DIAGNOSIS — R748 Abnormal levels of other serum enzymes: Secondary | ICD-10-CM | POA: Diagnosis not present

## 2018-06-04 DIAGNOSIS — R05 Cough: Secondary | ICD-10-CM | POA: Diagnosis not present

## 2018-06-04 DIAGNOSIS — M8580 Other specified disorders of bone density and structure, unspecified site: Secondary | ICD-10-CM | POA: Diagnosis not present

## 2018-06-04 DIAGNOSIS — M549 Dorsalgia, unspecified: Secondary | ICD-10-CM | POA: Diagnosis not present

## 2018-06-04 DIAGNOSIS — Z5112 Encounter for antineoplastic immunotherapy: Secondary | ICD-10-CM | POA: Insufficient documentation

## 2018-06-04 DIAGNOSIS — C7972 Secondary malignant neoplasm of left adrenal gland: Secondary | ICD-10-CM | POA: Insufficient documentation

## 2018-06-04 DIAGNOSIS — C3491 Malignant neoplasm of unspecified part of right bronchus or lung: Secondary | ICD-10-CM | POA: Diagnosis not present

## 2018-06-04 DIAGNOSIS — Z87891 Personal history of nicotine dependence: Secondary | ICD-10-CM | POA: Diagnosis not present

## 2018-06-04 DIAGNOSIS — C787 Secondary malignant neoplasm of liver and intrahepatic bile duct: Secondary | ICD-10-CM | POA: Insufficient documentation

## 2018-06-04 DIAGNOSIS — C7889 Secondary malignant neoplasm of other digestive organs: Secondary | ICD-10-CM | POA: Diagnosis not present

## 2018-06-04 DIAGNOSIS — E2740 Unspecified adrenocortical insufficiency: Secondary | ICD-10-CM | POA: Diagnosis not present

## 2018-06-04 DIAGNOSIS — Z79899 Other long term (current) drug therapy: Secondary | ICD-10-CM | POA: Diagnosis not present

## 2018-06-04 DIAGNOSIS — E43 Unspecified severe protein-calorie malnutrition: Secondary | ICD-10-CM

## 2018-06-04 DIAGNOSIS — E222 Syndrome of inappropriate secretion of antidiuretic hormone: Secondary | ICD-10-CM | POA: Insufficient documentation

## 2018-06-04 DIAGNOSIS — C3490 Malignant neoplasm of unspecified part of unspecified bronchus or lung: Secondary | ICD-10-CM

## 2018-06-04 DIAGNOSIS — K831 Obstruction of bile duct: Secondary | ICD-10-CM | POA: Diagnosis not present

## 2018-06-04 DIAGNOSIS — C7951 Secondary malignant neoplasm of bone: Secondary | ICD-10-CM | POA: Insufficient documentation

## 2018-06-04 DIAGNOSIS — M1990 Unspecified osteoarthritis, unspecified site: Secondary | ICD-10-CM | POA: Insufficient documentation

## 2018-06-04 LAB — CMP (CANCER CENTER ONLY)
ALBUMIN: 3.1 g/dL — AB (ref 3.5–5.0)
ALT: 31 U/L (ref 0–44)
ANION GAP: 9 (ref 5–15)
AST: 55 U/L — AB (ref 15–41)
Alkaline Phosphatase: 198 U/L — ABNORMAL HIGH (ref 38–126)
BUN: 11 mg/dL (ref 8–23)
CHLORIDE: 106 mmol/L (ref 98–111)
CO2: 25 mmol/L (ref 22–32)
Calcium: 9.1 mg/dL (ref 8.9–10.3)
Creatinine: 0.74 mg/dL (ref 0.44–1.00)
GFR, Est AFR Am: >60 mL/min (ref >60)
GFR, Estimated: >60 mL/min (ref >60)
GLUCOSE: 161 mg/dL — AB (ref 70–99)
POTASSIUM: 4.2 mmol/L (ref 3.5–5.1)
SODIUM: 140 mmol/L (ref 135–145)
Total Bilirubin: 0.5 mg/dL (ref 0.3–1.2)
Total Protein: 6.7 g/dL (ref 6.5–8.1)

## 2018-06-04 LAB — CBC WITH DIFFERENTIAL/PLATELET
Abs Immature Granulocytes: 0.04 10*3/uL (ref 0.00–0.07)
BASOS PCT: 0 %
Basophils Absolute: 0 10*3/uL (ref 0.0–0.1)
EOS ABS: 0 10*3/uL (ref 0.0–0.5)
Eosinophils Relative: 0 %
HEMATOCRIT: 38.6 % (ref 36.0–46.0)
Hemoglobin: 11.9 g/dL — ABNORMAL LOW (ref 12.0–15.0)
IMMATURE GRANULOCYTES: 1 %
LYMPHS ABS: 0.9 10*3/uL (ref 0.7–4.0)
Lymphocytes Relative: 12 %
MCH: 27.2 pg (ref 26.0–34.0)
MCHC: 30.8 g/dL (ref 30.0–36.0)
MCV: 88.1 fL (ref 80.0–100.0)
Monocytes Absolute: 0.5 10*3/uL (ref 0.1–1.0)
Monocytes Relative: 6 %
NEUTROS PCT: 81 %
Neutro Abs: 6 10*3/uL (ref 1.7–7.7)
Platelets: 226 10*3/uL (ref 150–400)
RBC: 4.38 MIL/uL (ref 3.87–5.11)
RDW: 15.9 % — AB (ref 11.5–15.5)
WBC: 7.5 10*3/uL (ref 4.0–10.5)
nRBC: 0 % (ref 0.0–0.2)

## 2018-06-04 LAB — MAGNESIUM: Magnesium: 1.9 mg/dL (ref 1.7–2.4)

## 2018-06-04 NOTE — Progress Notes (Signed)
HEMATOLOGY/ONCOLOGY CLINIC NOTE  Date of Service: 06/04/18  Patient Care Team: Haley Haley [name], Haley Nunnery, MD as PCP - General (Family Medicine)  CHIEF COMPLAINTS/PURPOSE OF CONSULTATION:   F/u for continued management of extensive stage small cell lung cancer. hyponatremia  HISTORY OF PRESENTING ILLNESS:   Haley Haley [name] is Haley Haley [name] 67 y.o. female who has been referred to [place] by Orthopedists Dr. Vickki [name] for evaluation and management of Bone Marrow Abnormality. Her PCP is Dr. Buelah [name].   She presents to the clinic today accompanied by her sons and her daughter-in-law. She notes she has several issues since October 2018.  She had colitis in 05/2017 with significant diarrhea and abdominal cramping --now mostly resolved.  She was treated with antibiotics. Afterward she had bronchitis. She was given albuterol and continues to take it as she has remaining cough (smoking contributed to this).   On 08/07/17 she notes she has back pain in her lower back the past couple of months. 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 Next day she saw her physician, who had done Haley [name] xray and told her she had arthritis. She was treated with prednisone and gabapentin. After no back pain improvements on treatment, she has yet to return to work. She went to orthopedist and had Haley [name] Lumbar MRI on 09/25/17. This shows Haley [name] compression fracture of the L2 with abnormalities in her bone marrow.   She quit smoking 09/24/17. She had been smoking 1 pack Haley [name] day for 50 years, since she was 67 yo. She still has cough from smoking but not as significant.   She denies heart conditions or disease, GI issues or diseases or other significant medical issues. DM runs in her family but no known cancers, blood disorders, or gene mutations. Prior to her health in the past 6 months she has lead Haley [name] independent overall healthy life. Her last mammogram in 04/2017 was negative. Last colonoscopy in 2015 and last Papsmear in 2017,  were negative. She had Haley [name] bone density scan in 2009. She has never been on HR therapy.   Given her significant pain (up to 10/10). She currently takes hydrocodone 1 tablet q4-5hours. This only reduced her pain to 7-8/10. She takes Tizanidine 27m q5-6hours. She has Haley [name] back brace that she does not use.   On review of symptoms, pt notes ongoing b/l hip pain, lower back pain which can radiate up the side and down her legs and buttocks. The pain can keep her up at night. She notes when she lays on her right side, her throat pain which causes her to cough. This started around time of bronchitis and has not improved. When she does cough she produces clear phlegm.  Interval History:   Haley Haley [name] here for Haley [name] scheduled follow-up of her extensive stage metastatic small cell lung cancer. The patient's last visit with Haley Haley [name] on 05/15/18. She is accompanied today by her sister. The pt reports that she is doing well overall.   The pt was able to see Haley [name] in the interim and Haley [name] tumor board decision was reached to watch brain with repeat MRI on 06/26/18 and reconsider possible interventions at that time.   The pt reports that she has not developed any new concerns in the interim. She notes that she is able to walk around without feeling SOB. She notes that she has continued to replace her sodium and has been compliant with this. She notes that her back pain is well controlled  at this time, and she has had good energy levels recently.   She has been prescribed Cymbalta and has been able to see behavioral health in the interim which she feels is helpful.   Lab results today (06/04/18) of CBC w/diff, CMP is as follows: all values are WNL except for HGB at 11.9, RDW at 15.9, Glucose at 161, Albumin at 3.1, AST at 55, Alk Phos at 198. 06/04/18 Magnesium at 1.9  On review of systems, pt reports improved energy levels, controlled back pain, and denies SOB, leg swelling, abdominal pains, headaches, and any other symptoms.     MEDICAL HISTORY:  Past Medical History:  Diagnosis Date  . Allergy   . Back pain   . Osteopenia   . Small cell lung cancer (Haley [name]) 10/17/2017   Extended stage with metastases to liver and bone  . Tubular adenoma of colon 10/26/2014    SURGICAL HISTORY: Past Surgical History:  Procedure Laterality Date  . BILIARY STENT PLACEMENT N/Haley [name] 04/14/2018   Procedure: BILIARY STENT PLACEMENT;  Surgeon: [name], MD;  Location: [facility];  Service: Gastroenterology;  Laterality: N/Haley [name];  . COLONOSCOPY    . COLONOSCOPY WITH PROPOFOL N/Haley [name] 10/25/2014   Procedure: COLONOSCOPY WITH PROPOFOL;  Surgeon: Garlan Fair, MD;  Location: [facility];  Service: Endoscopy;  Laterality: N/Haley [name];  . ERCP N/Haley [name] 10/15/2017   Procedure: ENDOSCOPIC RETROGRADE CHOLANGIOPANCREATOGRAPHY (ERCP);  Surgeon: Clarene Essex, MD;  Location: [facility];  Service: Endoscopy;  Laterality: N/Haley [name];  . ERCP N/Haley [name] 04/14/2018   Procedure: ENDOSCOPIC RETROGRADE CHOLANGIOPANCREATOGRAPHY (ERCP);  Surgeon: [name], MD;  Location: [facility];  Service: Gastroenterology;  Laterality: N/Haley [name];  with stent change and Balloon Sweep  . IR FLUORO GUIDE PORT INSERTION RIGHT  11/10/2017  . IR RADIOLOGIST EVAL & MGMT  10/02/2017  . IR US GUIDE VASC ACCESS RIGHT  11/10/2017  . PORT-Haley [name]-CATH REMOVAL N/Haley [name] 04/16/2018   Procedure: REMOVAL PORT-Haley [name]-CATH;  Surgeon: Clovis Riley, MD;  Location: [facility];  Service: General;  Laterality: N/Haley [name];    SOCIAL HISTORY: Social History   Socioeconomic History  . Marital status: Single    Spouse name: Not on file  . Number of children: Not on file  . Years of education: Not on file  . Highest education level: Not on file  Occupational History  . Not on file  Social Needs  . Financial resource strain: Not on file  . Food insecurity:    Worry: Not on file    Inability: Not on file  . Transportation needs:    Medical: No    Non-medical: No  Tobacco Use  . Smoking status: Former Smoker    Packs/day: 1.00    Years:  40.00    Pack years: 40.00    Types: Cigarettes    Last attempt to quit: 09/24/2017    Years since quitting: 0.6  . Smokeless tobacco: Never Used  Substance and Sexual Activity  . Alcohol use: Yes    Comment: rare social  . Drug use: No  . Sexual activity: Not Currently  Lifestyle  . Physical activity:    Days per week: Not on file    Minutes per session: Not on file  . Stress: Not on file  Relationships  . Social connections:    Talks on phone: Not on file    Gets together: Not on file    Attends religious service: Not on file    Active member of club or organization: Not on file    Attends meetings  of clubs or organizations: Not on file    Relationship status: Not on file  . Intimate partner violence:    Fear of current or ex partner: No    Emotionally abused: No    Physically abused: No    Forced sexual activity: No  Other Topics Concern  . Not on file  Social History Narrative  . Not on file    FAMILY HISTORY: Family History  Problem Relation Age of Onset  . Diabetes Mother   . Hypertension Mother   . Diabetes Sister   . Hypertension Sister   . Hyperlipidemia Sister   . Adrenal disorder Neg Hx     ALLERGIES:  has No Known Allergies.  MEDICATIONS:  Current Outpatient Medications  Medication Sig Dispense Refill  . calcium carbonate (OS-CAL - DOSED IN MG OF ELEMENTAL CALCIUM) 1250 (500 Ca) MG tablet Take 1 tablet by mouth daily.     . cholecalciferol (VITAMIN D) 1000 units tablet Take 1,000 Units by mouth daily.    Marland Kitchen dronabinol (MARINOL) 2.5 MG capsule Take 1 capsule (2.5 mg total) by mouth 2 (two) times daily before lunch and supper. (Patient not taking: Reported on 06/02/2018) 60 capsule 0  . DULoxetine (CYMBALTA) 20 MG capsule Take one capsule daily for 2 week and than two capsule daily 60 capsule 1  . famotidine (PEPCID AC) 10 MG chewable tablet Chew 10 mg by mouth 2 (two) times daily.    . hydrocortisone (CORTEF) 10 MG tablet Take 2 tablets (20 mg total) by  mouth daily with breakfast. And 42m po daily with lunch. (Patient taking differently: Take 10-20 mg by mouth See admin instructions. 20 mg every morning And 160mwith lunch.) 90 tablet 1  . KLOR-CON M20 20 MEQ tablet TAKE 1 TABLET (20 MEQ TOTAL) BY MOUTH DAILY. TAKE FOR 7 DAYS THEN REPEAT LABS. 90 tablet 1  . metoCLOPramide (REGLAN) 10 MG tablet Take 1 tablet (10 mg total) by mouth every 8 (eight) hours as needed for nausea. (Patient not taking: Reported on 05/21/2018) 45 tablet 1  . Multiple Vitamins-Minerals (MULTIVITAMIN WITH MINERALS) tablet Take 1 tablet by mouth daily.    . ondansetron (ZOFRAN) 4 MG tablet Take 1 tablet (4 mg total) by mouth every 8 (eight) hours as needed for nausea or vomiting. 20 tablet 0  . oxyCODONE (OXY IR/ROXICODONE) 5 MG immediate release tablet Take 1-2 tablets (5-10 mg total) by mouth every 4 (four) hours as needed for moderate pain or severe pain. 50 tablet 0  . polyethylene glycol (MIRALAX) packet Take 17 g by mouth daily as needed for moderate constipation. 30 each 0  . senna-docusate (SENNA S) 8.6-50 MG tablet Take 2 tablets by mouth at bedtime. (Patient taking differently: Take 2 tablets by mouth at bedtime as needed for mild constipation or moderate constipation. ) 60 tablet 1  . sodium chloride 1 g tablet Take 5 tablets (5 g total) by mouth 2 (two) times daily with Haley [name] meal. 150 tablet 5   No current facility-administered medications for this visit.    Facility-Administered Medications Ordered in Other Visits  Medication Dose Route Frequency Provider Last Rate Last Dose  . acetaminophen (TYLENOL) tablet 650 mg  650 mg Oral Once MoCurt BearsMD      . diphenhydrAMINE (BENADRYL) capsule 25 mg  25 mg Oral Once MoCurt BearsMD        REVIEW OF SYSTEMS:    Haley [name] 10+ POINT REVIEW OF SYSTEMS WAS OBTAINED including neurology, dermatology, psychiatry, cardiac,  respiratory, lymph, extremities, GI, GU, Musculoskeletal, constitutional, breasts, reproductive, HEENT.   All pertinent positives are noted in the HPI.  All others are negative.   PHYSICAL EXAMINATION: ECOG PERFORMANCE STATUS: 2 - Symptomatic, <50% confined to bed . Vitals:   06/04/18 1526  BP: 126/83  Pulse: 80  Resp: 18  Temp: 98.2 F (36.8 C)  SpO2: 96%   Filed Weights   06/04/18 1526  Weight: 112 lb 6.4 oz (51 kg)   .Body mass index is 19.91 kg/m.  GENERAL:alert, in no acute distress and comfortable SKIN: no acute rashes, no significant lesions EYES: conjunctiva are pink and non-injected, sclera anicteric OROPHARYNX: MMM, no exudates, no oropharyngeal erythema or ulceration NECK: supple, no JVD LYMPH:  no palpable lymphadenopathy in the cervical, axillary or inguinal regions LUNGS: clear to auscultation b/l with normal respiratory effort HEART: regular rate & rhythm ABDOMEN:  normoactive bowel sounds , non tender, not distended. No palpable hepatosplenomegaly.  Extremity: no pedal edema PSYCH: alert & oriented x 3 with fluent speech NEURO: no focal motor/sensory deficits   LABORATORY DATA:   . CBC Latest Ref Rng & Units 06/04/2018 05/15/2018 05/02/2018  WBC 4.0 - 10.5 K/uL 7.5 6.5 6.5  Hemoglobin 12.0 - 15.0 g/dL 11.9(L) 10.7(L) 10.2(L)  Hematocrit 36.0 - 46.0 % 38.6 33.8(L) 29.0(L)  Platelets 150 - 400 K/uL 226 322 354  HGB 9.6 . CMP Latest Ref Rng & Units 06/04/2018 05/15/2018 05/08/2018  Glucose 70 - 99 mg/dL 161(H) 74 124(H)  BUN 8 - 23 mg/dL _0 Creatinine 0.44 - 1.00 mg/dL 0.74 0.58 0.45  Sodium 135 - 145 mmol/L 140 141 128(L)  Potassium 3.5 - 5.1 mmol/L 4.2 3.7 5.0  Chloride 98 - 111 mmol/L 106 102 95(L)  CO2 22 - 32 mmol/L _1 Calcium 8.9 - 10.3 mg/dL 9.1 9.6 9.0  Total Protein 6.5 - 8.1 g/dL 6.7 6.7 -  Total Bilirubin 0.3 - 1.2 mg/dL 0.5 0.5 -  Alkaline Phos 38 - 126 U/L 198(H) 128(H) -  AST 15 - 41 U/L 55(H) 18 -  ALT 0 - 44 U/L 31 13 -       10/17/17 Liver needle/core biopsy   Fine Needle Aspiration, EUS, Pancreas3/12/2017 [facility] Result Narrative  ACCESSION NUMBER: [id number] RECEIVED: 10/24/2017 ORDERING PHYSICIAN: Harley Hallmark , MD PATIENT NAME: Haley Haley [name] CYTOLOGY REPORT  Final Cytologic Interpretation  Pancreas, endoscopic ultrasound-guided, fine needle aspiration II (smears and cell block): 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  Specimen Adequacy:Satisfactory for evaluation.    COMMENT:The smears show clusters of neoplastic cells with neuroendocrine nuclear features and scant cytoplasm.  Immunoperoxidase studies on limited cell block material show the neoplastic cells stain positive for synaptophysin, CD56, cytokeratin AE1/AE3 (Golgi-dot like staining) and TTF-1. The MIB-1 shows Haley [name] high proliferation index with positive staining in approximately 80% of the cells. Together with the cytomorphology this supports the diagnosis of small cell carcinoma. Given the presence of Haley [name] suspected right infrahilar lung mass on MRI per [name] and Haley [name] pancreatic mass, I cannot be certain of the site of origin and whether this represents Haley [name] primary pancreatic small cell poorly differentiated endocrine carcinoma versus Haley [name] metastatic small cell carcinoma of lung origin (both can express TTF-1). Clinical correlation advised.  Dr. Romilda Garret has reviewed the case in consultation and agrees with the diagnosis of small cell carcinoma.   The positive controls worked appropriately   "These tests were developed and their performance characteristics determined  by [name].They have not been cleared or approved by the U.S. Food and Drug Administration.The FDA has determined that such clearance or approval is not necessary.These tests are used for clinical purposes.They should not be regarded as investigational or for research.This laboratory is certified under the [place] (CLIA) as qualified to perform high complexity clinical laboratory testing."   Report Prepared By:O.Haley [name]. Sheryle Hail, M.D.  I have personally reviewed the slides and/or other related materials referenced, and have edited the report as part of my pathologic assessment and final interpretation.  Electronically Signed Out By: Blythe Stanford , MD 10/28/2017 20:55:03  sb/oah  Specimen(s) Received:  Pancreas, endoscopic ultrasound-guided, fine needle aspiration II (smears and cell block)  Clinical History obstructive jaundice  Preliminary (on-site) Interpretation  Adequate. L.[facility] CT(ASCP) 10/24/17     RADIOGRAPHIC STUDIES: I have personally reviewed the radiological images as listed and agreed with the findings in the report. No results found.  MRI Lumbar Spine 09/25/17  IMPRESSION:  1. Extensive marrow signal abnormality throughout the osseous structures of the visualized thoracolumbar spine, sacrum, and iliac bones concerning for metastases, multiple myeloma, or other nonspecific marrow infiltrative disorder. Suggest obtaining Haley [name] PET/CT to evaluate extent of disease/evaluate for Haley [name] primary tumor.  2. L2 compression fracture, concerning for pathologic fracture given the extensive marrow abnormality with approximately 40% vertebral body height loss and 8.22m of retropulsion resulting in moderate narrowing of the spinal canal. Haley [name] postcontrast MRI with fat saturation would help to evaluate the enhancements pattern of the bony disease and evaluate for potential abnormal soft issue extending beyond the cortex which is not definitively evident in this exam.  3. Moderate facet arthrophy with grade 1 anterolisthesis at L4-5.  Degenerative disc disease with moderate left foraminal stenosis at L5-S1.    ASSESSMENT & PLAN:   Haley KETTLEWELLis Haley [name] 67y.o. caucasian female with   1. Metastatic Extensive stage small cell lung cancer  10/17/17 Liver needle/core biopsy revealing small  cell carcinoma likely of lung primary; and diagnosis of Extensive Stage Small Cell Carcinoma.  Liver mets + Bone mets + adrenal mets Pancreatic mets- FNA confirmed MRI brain 10/29/2017 after this clinic visit showed concern for multiple asymptomatic subcentimeter brain mets. 11/28/17 Brain MRI which revealed stable lesions, and new nonenhancing lesions.  12/26/17 PET which revealed Marked improvement in the previously demonstrated widespread hypermetabolic metastatic disease. There is only mild residual hypermetabolic activity within some of the osseous lesions.   patient has completed 4 cycles of carboplatin/etoposide/Atezolizumab and completed -completed WBRT 30 Gy in 10 fractions 01/15/2018 - 01/29/2018  04/12/18 CT Haley [name]/P revealed concern for progression of mets in liver  2. 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   Component     Latest Ref Rng & Units 02/09/2018  Cortisol, Plasma     ug/dL 0.4  Osmolality     275 - 295 mOsm/kg 264 (L)  Sodium, Urine     mmol/L 82  Osmolality, Urine     300 - 900 mOsm/kg 633  T4,Free(Direct)     0.82 - 1.77 ng/dL 0.95  TSH     0.308 - 3.960 uIU/mL 0.894    3. Hypokalemia. Has been Haley [name] recurrent issue due to decreased po intake  PLAN: -Nutritional therapy consultation for optimizing p.o. Intake. -MRI brain -- most brain mets resolved. Couple of area uncertain for new changes. -CT chest/abd/pelvis with signs of SCLC progression  in liver, abd LN, left adrenal, lung and new C2 lesion. -Discussed pt labwork today, 06/04/18; Sodium normalized to 140. HGB improved to 11.9.  -Proceed with 06/26/18 MRI Brain and discussion with Haley [name] -The pt has no prohibitive toxicities from continuing C2 Pembrolizumab at this time.   -Continue Hydrocortisone 86m and 118meach day  -Will refill Hydrocortisone and Zofran today  -Reasonable to decrease 1g Sodium to 3 and 3 from 5 and 5 -Reasonable to decrease  2055mPotassium to once Haley [name] day -Will see the pt back with C3 Pembrolizumab    4.s/p obstructive Jaundice due to biliary compression from pancreatic mass  -ERCP attempted by Dr MagWatt Climesnot successful -ERCP completed on 10/24/17 with Dr. RisHarley Hallmarkth much improved LFTs -Bx of  pancreatic lesion -consistent with small cell lung cancer.  Now with abnormal LFTs and concerns with stent blockages and possible biliary infection. Rpt ERCP and restenting 04/15/2018 - concern forA single localized biliary stricture was found in the lower third of the main bile duct. The stricture was malignant appearing secondary to tumor ingrowth. - The lower third of the main bile duct was dilatedOne partially covered metal stent was placed into the common bile duct. -improving LFTs post re stenting  5. Back pain related to pathologic L2 fracture (causing elevated alkaline phosphatase levels. -continue using back brace -will hold of on interventional procedure to L2 at this time. -Per patient's clinical presentation; her hip and leg pain is likely nerve pinching due to compression fractures.  -Opiate related constipation: advised increasing Senna S to twice Haley [name] day and using Miralax as well; one time OTC Magnesium Citrate if she still cannot move bowels.  - Oxycodone every 4-6 hours as needed    6. Smoking history, productive cough  Smoked for 50 years, 1 pack Haley [name] day  Plan: -Smoking cessation being followed at this time.  7. Moderate to severe protein malnutrition due to weight loss -resolved . Wt Readings from Last 3 Encounters:  06/04/18 112 lb 6.4 oz (51 kg)  05/21/18 115 lb 6.4 oz (52.3 kg)  05/15/18 116 lb 8 oz (52.8 kg)   8. Adrenal insufficiency -Increased Hydrocortisone from 74m58m 20mg43mthe morning, and from 5mg t8m0mg w76mlunch -Follow up with endocrinologist at September scheduled visit   Please schedule C2 of Pembrolizumab for 06/12/2018 with labs Please schedule C3 of Pembrolizumab as  per ordered on 07/03/2018 with labs and MD visit    All of the patients questions were answered with apparent satisfaction. The patient knows to call the clinic with any problems, questions or concerns.   The total time spent in the appt was 25 minutes and more than 50% was on counseling and direct patient cares.   Gautam Sullivan LoneAAHIVMS SCH CTHWestgreen Surgical Center LLCmComplex Care [name] At Tenayalogy/Oncology Physician [facility]Reg[name]villece):       [phone]):  [phone]           [phone] 4:11 PM  I, SchuyleBaldwin Jamaicating as Haley [name] scribe for Dr. Kale  .Irene Limbove reviewed the above documentation for accuracy and completeness, and I agree with the above. .GautamBrunetta Genera

## 2018-06-05 ENCOUNTER — Telehealth (HOSPITAL_COMMUNITY): Payer: Self-pay

## 2018-06-05 NOTE — Telephone Encounter (Signed)
Patient is calling because they Cymbalta costs too much and she wants to know if there is a cheaper alternative. Please review and advise, thank you

## 2018-06-08 ENCOUNTER — Other Ambulatory Visit (HOSPITAL_COMMUNITY): Payer: Self-pay

## 2018-06-08 ENCOUNTER — Telehealth: Payer: Self-pay | Admitting: *Deleted

## 2018-06-08 ENCOUNTER — Encounter: Payer: Self-pay | Admitting: Endocrinology

## 2018-06-08 ENCOUNTER — Other Ambulatory Visit (HOSPITAL_COMMUNITY): Payer: Self-pay | Admitting: Psychiatry

## 2018-06-08 ENCOUNTER — Ambulatory Visit (INDEPENDENT_AMBULATORY_CARE_PROVIDER_SITE_OTHER): Payer: Medicare Other | Admitting: Endocrinology

## 2018-06-08 ENCOUNTER — Other Ambulatory Visit: Payer: Self-pay | Admitting: Hematology

## 2018-06-08 VITALS — BP 124/78 | HR 91 | Ht 63.0 in | Wt 112.0 lb

## 2018-06-08 DIAGNOSIS — E871 Hypo-osmolality and hyponatremia: Secondary | ICD-10-CM

## 2018-06-08 MED ORDER — ONDANSETRON HCL 4 MG PO TABS: 4.0000 mg | ORAL_TABLET | Freq: Three times a day (TID) | ORAL | 0 refills | Status: DC | PRN

## 2018-06-08 MED ORDER — OXYCODONE HCL 5 MG PO TABS: 5.0000 mg | ORAL_TABLET | ORAL | 0 refills | Status: DC | PRN

## 2018-06-08 MED ORDER — AMITRIPTYLINE HCL 25 MG PO TABS: ORAL_TABLET | ORAL | 1 refills | Status: DC

## 2018-06-08 NOTE — Progress Notes (Signed)
Subjective:    Patient ID: Haley [name], female    DOB: 07-24-1951, 67 y.o.   MRN: [id number]  HPI Pt returns for f/u of hyponatremia (she received chemotx for lung cancer in early 2019, and was found to have hyponatremia; she was started on cortef; CT showed left adrenal 2.0 cm nodule; tapering cortef is not an option, due to metastatic lung cancer; she declined declomycin, due to cost).  Nausea is much better, but it persists.  She has slight  Lightheadedness sensation in the head, but no assoc LOC.   Past Medical History:  Diagnosis Date  . Allergy   . Back pain   . Osteopenia   . 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 Tubular adenoma of colon 10/26/2014    Past Surgical History:  Procedure Laterality Date  . BILIARY STENT PLACEMENT N/A 04/14/2018   Procedure: BILIARY STENT PLACEMENT;  Surgeon: [name], MD;  Location: [facility];  Service: Gastroenterology;  Laterality: N/A;  . COLONOSCOPY    . COLONOSCOPY WITH PROPOFOL N/A 10/25/2014   Procedure: COLONOSCOPY WITH PROPOFOL;  Surgeon: Garlan Fair, MD;  Location: [facility];  Service: Endoscopy;  Laterality: N/A;  . ERCP N/A 10/15/2017   Procedure: ENDOSCOPIC RETROGRADE CHOLANGIOPANCREATOGRAPHY (ERCP);  Surgeon: Clarene Essex, MD;  Location: [facility];  Service: Endoscopy;  Laterality: N/A;  . ERCP N/A 04/14/2018   Procedure: ENDOSCOPIC RETROGRADE CHOLANGIOPANCREATOGRAPHY (ERCP);  Surgeon: [name], MD;  Location: [facility];  Service: Gastroenterology;  Laterality: N/A;  with stent change and Balloon Sweep  . IR FLUORO GUIDE PORT INSERTION RIGHT  11/10/2017  . IR RADIOLOGIST EVAL & MGMT  10/02/2017  . IR US GUIDE VASC ACCESS RIGHT  11/10/2017  . PORT-A-CATH REMOVAL N/A 04/16/2018   Procedure: REMOVAL PORT-A-CATH;  Surgeon: Clovis Riley, MD;  Location: [facility];  Service: General;  Laterality: N/A;    Social History   Socioeconomic History  . Marital  status: Single    Spouse name: Not on file  . Number of children: Not on file  . Years of education: Not on file  . Highest education level: Not on file  Occupational History  . Not on file  Social Needs  . Financial resource strain: Not on file  . Food insecurity:    Worry: Not on file    Inability: Not on file  . Transportation needs:    Medical: No    Non-medical: No  Tobacco Use  . Smoking status: Former Smoker    Packs/day: 1.00    Years: 40.00    Pack years: 40.00    Types: Cigarettes    Last attempt to quit: 09/24/2017    Years since quitting: 0.7  . Smokeless tobacco: Never Used  Substance and Sexual Activity  . Alcohol use: Yes    Comment: rare social  . Drug use: No  . Sexual activity: Not Currently  Lifestyle  . Physical activity:    Days per week: Not on file    Minutes per session: Not on file  . Stress: Not on file  Relationships  . Social connections:    Talks on phone: Not on file    Gets together: Not on file    Attends religious service: Not on file    Active member of club or organization: Not on file    Attends meetings of clubs or organizations: Not on file    Relationship status: Not on  file  . Intimate partner violence:    Fear of current or ex partner: No    Emotionally abused: No    Physically abused: No    Forced sexual activity: No  Other Topics Concern  . Not on file  Social History Narrative  . Not on file    Current Outpatient Medications on File Prior to Visit  Medication Sig Dispense Refill  . calcium carbonate (OS-CAL - DOSED IN MG OF ELEMENTAL CALCIUM) 1250 (500 Ca) MG tablet Take 1 tablet by mouth daily.     . cholecalciferol (VITAMIN D) 1000 units tablet Take 1,000 Units by mouth daily.    Marland Kitchen dronabinol (MARINOL) 2.5 MG capsule Take 1 capsule (2.5 mg total) by mouth 2 (two) times daily before lunch and supper. 60 capsule 0  . famotidine (PEPCID AC) 10 MG chewable tablet Chew 10 mg by mouth 2 (two) times daily.    .  hydrocortisone (CORTEF) 10 MG tablet TAKE 2 TABLETS (20 MG TOTAL) BY MOUTH DAILY WITH BREAKFAST AND 1 TABLET DAILY WITH LUNCH. 90 tablet 1  . Multiple Vitamins-Minerals (MULTIVITAMIN WITH MINERALS) tablet Take 1 tablet by mouth daily.    . polyethylene glycol (MIRALAX) packet Take 17 g by mouth daily as needed for moderate constipation. 30 each 0  . senna-docusate (SENNA S) 8.6-50 MG tablet Take 2 tablets by mouth at bedtime. (Patient taking differently: Take 2 tablets by mouth at bedtime as needed for mild constipation or moderate constipation. ) 60 tablet 1  . sodium chloride 1 g tablet Take 5 tablets (5 g total) by mouth 2 (two) times daily with a meal. 150 tablet 5  . DULoxetine (CYMBALTA) 20 MG capsule Take one capsule daily for 2 week and than two capsule daily 60 capsule 1  . metoCLOPramide (REGLAN) 10 MG tablet Take 1 tablet (10 mg total) by mouth every 8 (eight) hours as needed for nausea. (Patient not taking: Reported on 05/21/2018) 45 tablet 1  . [DISCONTINUED] prochlorperazine (COMPAZINE) 10 MG tablet Take 1 tablet (10 mg total) by mouth every 6 (six) hours as needed (Nausea or vomiting). 30 tablet 1   Current Facility-Administered Medications on File Prior to Visit  Medication Dose Route Frequency Provider Last Rate Last Dose  . acetaminophen (TYLENOL) tablet 650 mg  650 mg Oral Once Curt Bears, MD      . diphenhydrAMINE (BENADRYL) capsule 25 mg  25 mg Oral Once Curt Bears, MD        No Known Allergies  Family History  Problem Relation Age of Onset  . Diabetes Mother   . Hypertension Mother   . Diabetes Sister   . Hypertension Sister   . Hyperlipidemia Sister   . Adrenal disorder Neg Hx     BP 124/78 (BP Location: Right Arm, Patient Position: Sitting, Cuff Size: Normal)   Pulse 91   Ht 5\' 3"  (1.6 m)   Wt 112 lb (50.8 kg)   SpO2 95%   BMI 19.84 kg/m   Review of Systems She seldom has headache.  She has lost a few more lbs.      Objective:   Physical  Exam VITAL SIGNS:  See vs page GENERAL: no distress.  In wheelchair. Ext: no leg edema.     Lab Results  Component Value Date   CREATININE 0.74 06/04/2018   BUN 11 06/04/2018   NA 140 06/04/2018   K 4.2 06/04/2018   CL 106 06/04/2018   CO2 25 06/04/2018  Assessment & Plan:  Hyponatremia: well-controlled. Lightheadedness, persistent.  Not related to hyponatremia Adrenal nodule: endocrine w/u ot this is not indicated Stage 4 lung cancer: taper of cortef is also not indicated  Patient Instructions  Given your other health problems, it is not worth tapering off the hydrocortisone (which you would need to do in order to test the adrenal glands).  blood tests are requested for you today.  We'll let you know about the results.  The nodule in the adrenal is unlikely to be a problem from a hormonal standpoint, so you don't need hormonal testing for that.   Please come back for a follow-up appointment in 3 months.

## 2018-06-08 NOTE — Telephone Encounter (Signed)
I sent the order for amitriptyline to the pharmacy and called the patient to inform her.

## 2018-06-08 NOTE — Telephone Encounter (Signed)
Did she try generic Cymbalta? If she tried generic Cymbalta and it is expensive then she can try amitriptyline 25 mg at bedtime for 2 weeks and then 50 mg daily.

## 2018-06-08 NOTE — Telephone Encounter (Signed)
Patient left VM requesting refills for Zofran and Oxycodone and steroids. She asks if a note was needed to travel. She wants to see her sister who has been diagnosed with a life threatening illness and asked if MD would approve of her making the trip. MD advised that she should make decision to travel based on protecting her own health especially if she were flying. Steroids refilled 10/11. MD refilled Zofran and oxycodone today.

## 2018-06-08 NOTE — Patient Instructions (Addendum)
Given your other health problems, it is not worth tapering off the hydrocortisone (which you would need to do in order to test the adrenal glands).  blood tests are requested for you today.  We'll let you know about the results.  The nodule in the adrenal is unlikely to be a problem from a hormonal standpoint, so you don't need hormonal testing for that.   Please come back for a follow-up appointment in 3 months.

## 2018-06-10 ENCOUNTER — Ambulatory Visit (HOSPITAL_COMMUNITY): Payer: Self-pay | Admitting: Licensed Clinical Social Worker

## 2018-06-11 ENCOUNTER — Inpatient Hospital Stay: Payer: Medicare Other

## 2018-06-11 VITALS — BP 143/92 | HR 92 | Temp 97.8°F | Resp 20

## 2018-06-11 DIAGNOSIS — C7951 Secondary malignant neoplasm of bone: Secondary | ICD-10-CM

## 2018-06-11 DIAGNOSIS — Z5112 Encounter for antineoplastic immunotherapy: Secondary | ICD-10-CM | POA: Diagnosis not present

## 2018-06-11 DIAGNOSIS — C3490 Malignant neoplasm of unspecified part of unspecified bronchus or lung: Secondary | ICD-10-CM

## 2018-06-11 DIAGNOSIS — Z7189 Other specified counseling: Secondary | ICD-10-CM

## 2018-06-11 LAB — CMP (CANCER CENTER ONLY)
ALT: 59 U/L — ABNORMAL HIGH (ref 0–44)
AST: 128 U/L — ABNORMAL HIGH (ref 15–41)
Albumin: 3.1 g/dL — ABNORMAL LOW (ref 3.5–5.0)
Alkaline Phosphatase: 334 U/L — ABNORMAL HIGH (ref 38–126)
Anion gap: 13 (ref 5–15)
BUN: 20 mg/dL (ref 8–23)
CO2: 23 mmol/L (ref 22–32)
Calcium: 9.6 mg/dL (ref 8.9–10.3)
Chloride: 106 mmol/L (ref 98–111)
Creatinine: 0.69 mg/dL (ref 0.44–1.00)
GFR, Estimated: >60 mL/min (ref >60)
Glucose, Bld: 188 mg/dL — ABNORMAL HIGH (ref 70–99)
Potassium: 3.6 mmol/L (ref 3.5–5.1)
SODIUM: 142 mmol/L (ref 135–145)
Total Bilirubin: 0.7 mg/dL (ref 0.3–1.2)
Total Protein: 6.8 g/dL (ref 6.5–8.1)

## 2018-06-11 LAB — CBC WITH DIFFERENTIAL/PLATELET
ABS IMMATURE GRANULOCYTES: 0.07 10*3/uL (ref 0.00–0.07)
Basophils Absolute: 0 10*3/uL (ref 0.0–0.1)
Basophils Relative: 0 %
EOS ABS: 0 10*3/uL (ref 0.0–0.5)
Eosinophils Relative: 0 %
HEMATOCRIT: 40.4 % (ref 36.0–46.0)
Hemoglobin: 12.8 g/dL (ref 12.0–15.0)
Immature Granulocytes: 1 %
LYMPHS ABS: 1.6 10*3/uL (ref 0.7–4.0)
Lymphocytes Relative: 18 %
MCH: 27.6 pg (ref 26.0–34.0)
MCHC: 31.7 g/dL (ref 30.0–36.0)
MCV: 87.3 fL (ref 80.0–100.0)
MONOS PCT: 8 %
Monocytes Absolute: 0.7 10*3/uL (ref 0.1–1.0)
NEUTROS ABS: 6.2 10*3/uL (ref 1.7–7.7)
NEUTROS PCT: 73 %
Platelets: 185 10*3/uL (ref 150–400)
RBC: 4.63 MIL/uL (ref 3.87–5.11)
RDW: 16.9 % — ABNORMAL HIGH (ref 11.5–15.5)
WBC: 8.6 10*3/uL (ref 4.0–10.5)
nRBC: 0 % (ref 0.0–0.2)

## 2018-06-11 MED ORDER — SODIUM CHLORIDE 0.9 % IV SOLN
200.0000 mg | Freq: Once | INTRAVENOUS | Status: AC
  Administered 2018-06-11: 200 mg via INTRAVENOUS
  Filled 2018-06-11: qty 8

## 2018-06-11 MED ORDER — DENOSUMAB 120 MG/1.7ML ~~LOC~~ SOLN
SUBCUTANEOUS | Status: AC
  Filled 2018-06-11: qty 1.7

## 2018-06-11 MED ORDER — DENOSUMAB 120 MG/1.7ML ~~LOC~~ SOLN
120.0000 mg | Freq: Once | SUBCUTANEOUS | Status: AC
  Administered 2018-06-11: 120 mg via SUBCUTANEOUS

## 2018-06-11 MED ORDER — SODIUM CHLORIDE 0.9 % IV SOLN
Freq: Once | INTRAVENOUS | Status: AC
  Administered 2018-06-11: 15:00:00 via INTRAVENOUS
  Filled 2018-06-11: qty 250

## 2018-06-11 NOTE — Progress Notes (Signed)
Spoke w/ MD, patient is to restart Xgeva today - she was getting previously for bone mets and it hadn't been resumed with change in treatments.   Plan is to continue this every 4 weeks.   Demetrius Charity, PharmD, [facility] Pharmacist Pharmacy Phone: [phone] 06/11/2018

## 2018-06-11 NOTE — Progress Notes (Signed)
Ok to treat with ALT 128 per Dr. Irene Limbo.

## 2018-06-11 NOTE — Patient Instructions (Signed)
[facility] Discharge Instructions for Patients Receiving Chemotherapy  Today you received the following chemotherapy agents Keytruda.  To help prevent nausea and vomiting after your treatment, we encourage you to take your nausea medication as directed.  If you develop nausea and vomiting that is not controlled by your nausea medication, call the clinic.   BELOW ARE SYMPTOMS THAT SHOULD BE REPORTED IMMEDIATELY:  *FEVER GREATER THAN 100.5 F  *CHILLS WITH OR WITHOUT FEVER  NAUSEA AND VOMITING THAT IS NOT CONTROLLED WITH YOUR NAUSEA MEDICATION  *UNUSUAL SHORTNESS OF BREATH  *UNUSUAL BRUISING OR BLEEDING  TENDERNESS IN MOUTH AND THROAT WITH OR WITHOUT PRESENCE OF ULCERS  *URINARY PROBLEMS  *BOWEL PROBLEMS  UNUSUAL RASH Items with * indicate a potential emergency and should be followed up as soon as possible.  Feel free to call the clinic should you have any questions or concerns. The clinic phone number is [phone].  Please show the [place] at check-in to the Emergency Department and triage nurse.  Pembrolizumab injection What is this medicine? PEMBROLIZUMAB (pem broe liz ue mab) is a monoclonal antibody. It is used to treat melanoma, head and neck cancer, Hodgkin lymphoma, non-small cell lung cancer, urothelial cancer, stomach cancer, and cancers that have a certain genetic condition. This medicine may be used for other purposes; ask your health care provider or pharmacist if you have questions. COMMON BRAND NAME(S): Keytruda What should I tell my health care provider before I take this medicine? They need to know if you have any of these conditions: -diabetes -immune system problems -inflammatory bowel disease -liver disease -lung or breathing disease -lupus -organ transplant -an unusual or allergic reaction to pembrolizumab, other medicines, foods, dyes, or preservatives -pregnant or trying to get pregnant -breast-feeding How should I use  this medicine? This medicine is for infusion into a vein. It is given by a health care professional in a hospital or clinic setting. A special MedGuide will be given to you before each treatment. Be sure to read this information carefully each time. Talk to your pediatrician regarding the use of this medicine in children. While this drug may be prescribed for selected conditions, precautions do apply. Overdosage: If you think you have taken too much of this medicine contact a poison control center or emergency room at once. NOTE: This medicine is only for you. Do not share this medicine with others. What if I miss a dose? It is important not to miss your dose. Call your doctor or health care professional if you are unable to keep an appointment. What may interact with this medicine? Interactions have not been studied. Give your health care provider a list of all the medicines, herbs, non-prescription drugs, or dietary supplements you use. Also tell them if you smoke, drink alcohol, or use illegal drugs. Some items may interact with your medicine. This list may not describe all possible interactions. Give your health care provider a list of all the medicines, herbs, non-prescription drugs, or dietary supplements you use. Also tell them if you smoke, drink alcohol, or use illegal drugs. Some items may interact with your medicine. What should I watch for while using this medicine? Your condition will be monitored carefully while you are receiving this medicine. You may need blood work done while you are taking this medicine. Do not become pregnant while taking this medicine or for 4 months after stopping it. Women should inform their doctor if they wish to become pregnant or think they might be  pregnant. There is a potential for serious side effects to an unborn child. Talk to your health care professional or pharmacist for more information. Do not breast-feed an infant while taking this medicine or for  4 months after the last dose. What side effects may I notice from receiving this medicine? Side effects that you should report to your doctor or health care professional as soon as possible: -allergic reactions like skin rash, itching or hives, swelling of the face, lips, or tongue -bloody or black, tarry -breathing problems -changes in vision -chest pain -chills -constipation -cough -dizziness or feeling faint or lightheaded -fast or irregular heartbeat -fever -flushing -hair loss -low blood counts - this medicine may decrease the number of white blood cells, red blood cells and platelets. You may be at increased risk for infections and bleeding. -muscle pain -muscle weakness -persistent headache -signs and symptoms of high blood sugar such as dizziness; dry mouth; dry skin; fruity breath; nausea; stomach pain; increased hunger or thirst; increased urination -signs and symptoms of kidney injury like trouble passing urine or change in the amount of urine -signs and symptoms of liver injury like dark urine, light-colored stools, loss of appetite, nausea, right upper belly pain, yellowing of the eyes or skin -stomach pain -sweating -weight loss Side effects that usually do not require medical attention (report to your doctor or health care professional if they continue or are bothersome): -decreased appetite -diarrhea -tiredness This list may not describe all possible side effects. Call your doctor for medical advice about side effects. You may report side effects to FDA at 1-800-FDA-1088. Where should I keep my medicine? This drug is given in a hospital or clinic and will not be stored at home. NOTE: This sheet is a summary. It may not cover all possible information. If you have questions about this medicine, talk to your doctor, pharmacist, or health care provider.  2018 [employer]vier/Gold Standard (2016-05-21 12:29:36)

## 2018-06-18 ENCOUNTER — Emergency Department (HOSPITAL_COMMUNITY): Payer: Medicare Other

## 2018-06-18 ENCOUNTER — Other Ambulatory Visit: Payer: Self-pay

## 2018-06-18 ENCOUNTER — Inpatient Hospital Stay (HOSPITAL_COMMUNITY)
Admission: EM | Admit: 2018-06-18 | Discharge: 2018-06-21 | DRG: 948 | Disposition: A | Discharge status: Hospice | Payer: Medicare Other | Attending: Internal Medicine | Admitting: Internal Medicine

## 2018-06-18 ENCOUNTER — Encounter (HOSPITAL_COMMUNITY): Payer: Self-pay

## 2018-06-18 DIAGNOSIS — C7951 Secondary malignant neoplasm of bone: Secondary | ICD-10-CM | POA: Diagnosis present

## 2018-06-18 DIAGNOSIS — Z9221 Personal history of antineoplastic chemotherapy: Secondary | ICD-10-CM

## 2018-06-18 DIAGNOSIS — C7989 Secondary malignant neoplasm of other specified sites: Secondary | ICD-10-CM | POA: Diagnosis present

## 2018-06-18 DIAGNOSIS — Z923 Personal history of irradiation: Secondary | ICD-10-CM

## 2018-06-18 DIAGNOSIS — M8458XD Pathological fracture in neoplastic disease, other specified site, subsequent encounter for fracture with routine healing: Secondary | ICD-10-CM | POA: Diagnosis present

## 2018-06-18 DIAGNOSIS — E872 Acidosis: Secondary | ICD-10-CM | POA: Diagnosis present

## 2018-06-18 DIAGNOSIS — R1030 Lower abdominal pain, unspecified: Secondary | ICD-10-CM | POA: Diagnosis not present

## 2018-06-18 DIAGNOSIS — Z833 Family history of diabetes mellitus: Secondary | ICD-10-CM

## 2018-06-18 DIAGNOSIS — Z66 Do not resuscitate: Secondary | ICD-10-CM | POA: Diagnosis not present

## 2018-06-18 DIAGNOSIS — C7970 Secondary malignant neoplasm of unspecified adrenal gland: Secondary | ICD-10-CM | POA: Diagnosis present

## 2018-06-18 DIAGNOSIS — C3490 Malignant neoplasm of unspecified part of unspecified bronchus or lung: Secondary | ICD-10-CM | POA: Diagnosis present

## 2018-06-18 DIAGNOSIS — R945 Abnormal results of liver function studies: Secondary | ICD-10-CM

## 2018-06-18 DIAGNOSIS — R1084 Generalized abdominal pain: Secondary | ICD-10-CM | POA: Diagnosis present

## 2018-06-18 DIAGNOSIS — G8929 Other chronic pain: Secondary | ICD-10-CM | POA: Diagnosis present

## 2018-06-18 DIAGNOSIS — E876 Hypokalemia: Secondary | ICD-10-CM

## 2018-06-18 DIAGNOSIS — J9811 Atelectasis: Secondary | ICD-10-CM

## 2018-06-18 DIAGNOSIS — R5381 Other malaise: Secondary | ICD-10-CM | POA: Diagnosis present

## 2018-06-18 DIAGNOSIS — K859 Acute pancreatitis without necrosis or infection, unspecified: Secondary | ICD-10-CM | POA: Diagnosis present

## 2018-06-18 DIAGNOSIS — Z87891 Personal history of nicotine dependence: Secondary | ICD-10-CM

## 2018-06-18 DIAGNOSIS — G893 Neoplasm related pain (acute) (chronic): Secondary | ICD-10-CM | POA: Diagnosis not present

## 2018-06-18 DIAGNOSIS — C787 Secondary malignant neoplasm of liver and intrahepatic bile duct: Secondary | ICD-10-CM | POA: Diagnosis present

## 2018-06-18 DIAGNOSIS — Z8249 Family history of ischemic heart disease and other diseases of the circulatory system: Secondary | ICD-10-CM

## 2018-06-18 DIAGNOSIS — Z515 Encounter for palliative care: Secondary | ICD-10-CM | POA: Diagnosis not present

## 2018-06-18 LAB — COMPREHENSIVE METABOLIC PANEL
ALK PHOS: 400 U/L — AB (ref 38–126)
ALT: 105 U/L — AB (ref 0–44)
AST: 251 U/L — AB (ref 15–41)
Albumin: 2.9 g/dL — ABNORMAL LOW (ref 3.5–5.0)
Anion gap: 11 (ref 5–15)
BUN: 27 mg/dL — AB (ref 8–23)
CALCIUM: 8.3 mg/dL — AB (ref 8.9–10.3)
CHLORIDE: 103 mmol/L (ref 98–111)
CO2: 29 mmol/L (ref 22–32)
CREATININE: 0.66 mg/dL (ref 0.44–1.00)
GFR calc Af Amer: >60 mL/min (ref >60)
GFR calc non Af Amer: >60 mL/min (ref >60)
Glucose, Bld: 131 mg/dL — ABNORMAL HIGH (ref 70–99)
Potassium: 3 mmol/L — ABNORMAL LOW (ref 3.5–5.1)
Sodium: 143 mmol/L (ref 135–145)
Total Bilirubin: 1.8 mg/dL — ABNORMAL HIGH (ref 0.3–1.2)
Total Protein: 6.3 g/dL — ABNORMAL LOW (ref 6.5–8.1)

## 2018-06-18 LAB — I-STAT CG4 LACTIC ACID, ED
Lactic Acid, Venous: 2.82 mmol/L (ref 0.5–1.9)
Lactic Acid, Venous: 3.21 mmol/L (ref 0.5–1.9)

## 2018-06-18 LAB — CBC WITH DIFFERENTIAL/PLATELET
ABS IMMATURE GRANULOCYTES: 0.11 10*3/uL — AB (ref 0.00–0.07)
Basophils Absolute: 0 10*3/uL (ref 0.0–0.1)
Basophils Relative: 0 %
EOS PCT: 0 %
Eosinophils Absolute: 0 10*3/uL (ref 0.0–0.5)
HCT: 42.8 % (ref 36.0–46.0)
HEMOGLOBIN: 13.2 g/dL (ref 12.0–15.0)
IMMATURE GRANULOCYTES: 1 %
LYMPHS ABS: 1.1 10*3/uL (ref 0.7–4.0)
LYMPHS PCT: 12 %
MCH: 27.6 pg (ref 26.0–34.0)
MCHC: 30.8 g/dL (ref 30.0–36.0)
MCV: 89.4 fL (ref 80.0–100.0)
MONO ABS: 0.8 10*3/uL (ref 0.1–1.0)
MONOS PCT: 9 %
NEUTROS ABS: 7.5 10*3/uL (ref 1.7–7.7)
Neutrophils Relative %: 78 %
PLATELETS: 174 10*3/uL (ref 150–400)
RBC: 4.79 MIL/uL (ref 3.87–5.11)
RDW: 18.3 % — ABNORMAL HIGH (ref 11.5–15.5)
WBC: 9.6 10*3/uL (ref 4.0–10.5)
nRBC: 0.5 % — ABNORMAL HIGH (ref 0.0–0.2)

## 2018-06-18 LAB — I-STAT CHEM 8, ED
BUN: 26 mg/dL — AB (ref 8–23)
CALCIUM ION: 1.02 mmol/L — AB (ref 1.15–1.40)
CREATININE: 0.6 mg/dL (ref 0.44–1.00)
Chloride: 101 mmol/L (ref 98–111)
Glucose, Bld: 126 mg/dL — ABNORMAL HIGH (ref 70–99)
HCT: 40 % (ref 36.0–46.0)
HEMOGLOBIN: 13.6 g/dL (ref 12.0–15.0)
Potassium: 2.9 mmol/L — ABNORMAL LOW (ref 3.5–5.1)
Sodium: 141 mmol/L (ref 135–145)
TCO2: 29 mmol/L (ref 22–32)

## 2018-06-18 LAB — LIPASE, BLOOD: Lipase: 80 U/L — ABNORMAL HIGH (ref 11–51)

## 2018-06-18 MED ORDER — IOPAMIDOL (ISOVUE-370) INJECTION 76%
INTRAVENOUS | Status: AC
  Filled 2018-06-18: qty 100

## 2018-06-18 MED ORDER — IOPAMIDOL (ISOVUE-370) INJECTION 76%
100.0000 mL | Freq: Once | INTRAVENOUS | Status: AC | PRN
  Administered 2018-06-18: 100 mL via INTRAVENOUS

## 2018-06-18 MED ORDER — ONDANSETRON HCL 4 MG/2ML IJ SOLN
4.0000 mg | Freq: Once | INTRAMUSCULAR | Status: AC
  Administered 2018-06-18: 4 mg via INTRAVENOUS
  Filled 2018-06-18: qty 2

## 2018-06-18 MED ORDER — VANCOMYCIN HCL IN DEXTROSE 750-5 MG/150ML-% IV SOLN
750.0000 mg | INTRAVENOUS | Status: DC
  Administered 2018-06-19: 750 mg via INTRAVENOUS
  Filled 2018-06-18: qty 150

## 2018-06-18 MED ORDER — OXYCODONE HCL 5 MG PO TABS
5.0000 mg | ORAL_TABLET | ORAL | Status: AC | PRN
  Administered 2018-06-18 – 2018-06-19 (×3): 10 mg via ORAL
  Filled 2018-06-18 (×3): qty 2

## 2018-06-18 MED ORDER — MAGNESIUM SULFATE 2 GM/50ML IV SOLN
2.0000 g | Freq: Once | INTRAVENOUS | Status: AC
  Administered 2018-06-18: 2 g via INTRAVENOUS
  Filled 2018-06-18: qty 50

## 2018-06-18 MED ORDER — SODIUM CHLORIDE 0.9 % IV SOLN
INTRAVENOUS | Status: DC | PRN
  Administered 2018-06-18: 500 mL via INTRAVENOUS

## 2018-06-18 MED ORDER — POTASSIUM CHLORIDE 10 MEQ/100ML IV SOLN
10.0000 meq | INTRAVENOUS | Status: AC
  Administered 2018-06-18 (×3): 10 meq via INTRAVENOUS
  Filled 2018-06-18 (×3): qty 100

## 2018-06-18 MED ORDER — SODIUM CHLORIDE 0.9 % IV SOLN
1.0000 g | INTRAVENOUS | Status: AC
  Administered 2018-06-18: 1 g via INTRAVENOUS
  Filled 2018-06-18: qty 1

## 2018-06-18 MED ORDER — POTASSIUM CHLORIDE CRYS ER 20 MEQ PO TBCR
40.0000 meq | EXTENDED_RELEASE_TABLET | Freq: Once | ORAL | Status: AC
  Administered 2018-06-18: 40 meq via ORAL
  Filled 2018-06-18: qty 2

## 2018-06-18 MED ORDER — VANCOMYCIN HCL IN DEXTROSE 1-5 GM/200ML-% IV SOLN
1000.0000 mg | INTRAVENOUS | Status: AC
  Administered 2018-06-18: 1000 mg via INTRAVENOUS
  Filled 2018-06-18: qty 200

## 2018-06-18 MED ORDER — SODIUM CHLORIDE 0.45 % IV SOLN
INTRAVENOUS | Status: DC
  Administered 2018-06-18 – 2018-06-19 (×2): via INTRAVENOUS

## 2018-06-18 MED ORDER — ENOXAPARIN SODIUM 40 MG/0.4ML ~~LOC~~ SOLN
40.0000 mg | SUBCUTANEOUS | Status: DC
  Administered 2018-06-18 – 2018-06-19 (×2): 40 mg via SUBCUTANEOUS
  Filled 2018-06-18 (×2): qty 0.4

## 2018-06-18 MED ORDER — SODIUM CHLORIDE 0.9 % IV SOLN
1.0000 g | Freq: Three times a day (TID) | INTRAVENOUS | Status: DC
  Administered 2018-06-19 – 2018-06-20 (×5): 1 g via INTRAVENOUS
  Filled 2018-06-18 (×5): qty 1

## 2018-06-18 MED ORDER — SODIUM CHLORIDE 0.9 % IJ SOLN
INTRAMUSCULAR | Status: AC
  Filled 2018-06-18: qty 50

## 2018-06-18 MED ORDER — SODIUM CHLORIDE 0.9 % IV BOLUS
1000.0000 mL | Freq: Once | INTRAVENOUS | Status: AC
  Administered 2018-06-18: 1000 mL via INTRAVENOUS

## 2018-06-18 NOTE — ED Notes (Signed)
ED TO INPATIENT HANDOFF REPORT  Name/Age/Gender Haley [name] 67 y.o. female  Code Status    Code Status Orders  (From admission, onward)         Start     Ordered   06/18/18 1706  Full code  Continuous     06/18/18 1705        Code Status History    Date Active Date Inactive Code Status Order ID Comments User Context   05/01/2018 1436 05/05/2018 1801 Full Code [id number]  Roxan Hockey, MD Inpatient   04/13/2018 0320 04/18/2018 2042 Full Code [id number]  Renee Pain, MD ED   03/01/2018 1504 03/03/2018 2022 Full Code [id number]  Aline August, MD ED   12/19/2017 1836 12/22/2017 1424 Full Code [id number]  Velvet Bathe, MD Inpatient   10/13/2017 1742 10/18/2017 1712 Full Code [id number]  Theodis Blaze, MD Inpatient      Home/SNF/Other Home  Chief Complaint SOB  Level of Care/Admitting Diagnosis ED Disposition    ED Disposition Condition [facility] Area: [facility] [[id number]]  Level of Care: Med-Surg [16]  Diagnosis: Pancreatitis [883254]  Admitting Physician: Georgette Shell [[id number]]  Attending Physician: Georgette Shell [[id number]]  PT Class (Do Not Modify): Observation [104]  PT Acc Code (Do Not Modify): Observation [10022]       Medical History Past Medical History:  Diagnosis Date  . Allergy   . Back pain   . Osteopenia   . 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 Tubular adenoma of colon 10/26/2014    Allergies No Known Allergies  IV Location/Drains/Wounds Patient Lines/Drains/Airways Status   Active Line/Drains/Airways    Name:   Placement date:   Placement time:   Site:   Days:   Peripheral IV 06/18/18 Left Antecubital   06/18/18    1410    Antecubital   less than 1   PICC Single Lumen 68/08/81 PICC Left Basilic 41 cm 0 cm   06/25/58    4585    Basilic   61   GI Stent 4 Fr.   10/15/17    1312    --   246   GI Stent 8 Fr.   04/14/18    1433    --   65    Incision (Closed) 04/16/18 Chest Right   04/16/18    1509     63          Labs/Imaging Results for orders placed or performed during the hospital encounter of 06/18/18 (from the past 48 hour(s))  CBC with Differential/Platelet     Status: Abnormal   Collection Time: 06/18/18  2:14 PM  Result Value Ref Range   WBC 9.6 4.0 - 10.5 K/uL   RBC 4.79 3.87 - 5.11 MIL/uL   Hemoglobin 13.2 12.0 - 15.0 g/dL   HCT 42.8 36.0 - 46.0 %   MCV 89.4 80.0 - 100.0 fL   MCH 27.6 26.0 - 34.0 pg   MCHC 30.8 30.0 - 36.0 g/dL   RDW 18.3 (H) 11.5 - 15.5 %   Platelets 174 150 - 400 K/uL   nRBC 0.5 (H) 0.0 - 0.2 %   Neutrophils Relative % 78 %   Neutro Abs 7.5 1.7 - 7.7 K/uL   Lymphocytes Relative 12 %   Lymphs Abs 1.1 0.7 - 4.0 K/uL   Monocytes Relative 9 %   Monocytes Absolute 0.8 0.1 -  1.0 K/uL   Eosinophils Relative 0 %   Eosinophils Absolute 0.0 0.0 - 0.5 K/uL   Basophils Relative 0 %   Basophils Absolute 0.0 0.0 - 0.1 K/uL   Immature Granulocytes 1 %   Abs Immature Granulocytes 0.11 (H) 0.00 - 0.07 K/uL    Comment: Performed at [facility], [place] [address], [place], [place] [address]  Comprehensive metabolic panel     Status: Abnormal   Collection Time: 06/18/18  2:14 PM  Result Value Ref Range   Sodium 143 135 - 145 mmol/L   Potassium 3.0 (L) 3.5 - 5.1 mmol/L   Chloride 103 98 - 111 mmol/L   CO2 29 22 - 32 mmol/L   Glucose, Bld 131 (H) 70 - 99 mg/dL   BUN 27 (H) 8 - 23 mg/dL   Creatinine, Ser 0.66 0.44 - 1.00 mg/dL   Calcium 8.3 (L) 8.9 - 10.3 mg/dL   Total Protein 6.3 (L) 6.5 - 8.1 g/dL   Albumin 2.9 (L) 3.5 - 5.0 g/dL   AST 251 (H) 15 - 41 U/L   ALT 105 (H) 0 - 44 U/L   Alkaline Phosphatase 400 (H) 38 - 126 U/L   Total Bilirubin 1.8 (H) 0.3 - 1.2 mg/dL   GFR calc non Af Amer >60 >60 mL/min   GFR calc Af Amer >60 >60 mL/min    Comment: (NOTE) The eGFR has been calculated using the CKD EPI equation. This calculation has not been validated in all clinical  situations. eGFR's persistently <60 mL/min signify possible Chronic Kidney Disease.    Anion gap 11 5 - 15    Comment: Performed at [facility], [place] [address], [place], [place] [address]  Lipase, blood     Status: Abnormal   Collection Time: 06/18/18  2:14 PM  Result Value Ref Range   Lipase 80 (H) 11 - 51 U/L    Comment: Performed at [facility], [place] [address], [place], [place] [address]  I-stat chem 8, ed     Status: Abnormal   Collection Time: 06/18/18  2:21 PM  Result Value Ref Range   Sodium 141 135 - 145 mmol/L   Potassium 2.9 (L) 3.5 - 5.1 mmol/L   Chloride 101 98 - 111 mmol/L   BUN 26 (H) 8 - 23 mg/dL   Creatinine, Ser 0.60 0.44 - 1.00 mg/dL   Glucose, Bld 126 (H) 70 - 99 mg/dL   Calcium, Ion 1.02 (L) 1.15 - 1.40 mmol/L   TCO2 29 22 - 32 mmol/L   Hemoglobin 13.6 12.0 - 15.0 g/dL   HCT 40.0 36.0 - 46.0 %  I-Stat CG4 Lactic Acid, ED     Status: Abnormal   Collection Time: 06/18/18  2:22 PM  Result Value Ref Range   Lactic Acid, Venous 2.82 (HH) 0.5 - 1.9 mmol/L   Comment NOTIFIED PHYSICIAN   I-Stat CG4 Lactic Acid, ED     Status: Abnormal   Collection Time: 06/18/18  5:00 PM  Result Value Ref Range   Lactic Acid, Venous 3.21 (HH) 0.5 - 1.9 mmol/L   Comment NOTIFIED PHYSICIAN    Dg Chest 2 View  Result Date: 06/18/2018 CLINICAL DATA:  Short of breath, nausea, abdominal pain, history of lung carcinoma EXAM: CHEST - 2 VIEW COMPARISON:  CT abdomen pelvis of 05/02/2018 and chest x-ray of 05/13/2017 FINDINGS: There is opacity at the right lung base which may represent atelectasis and/or pneumonia. The left lung is clear. Mediastinal  and hilar contours are unremarkable. The heart is mildly enlarged. No bony abnormality is seen. IMPRESSION: Abnormal opacity at the right lung base may be due to atelectasis and possibly pneumonia in this patient with known right infrahilar lesion. Electronically Signed   By: Ivar Drape M.D.   On: 06/18/2018  14:07   Ct Angio Chest Pe W And/or Wo Contrast  Result Date: 06/18/2018 CLINICAL DATA:  Short of breath, abdominal pain, history of lung carcinoma EXAM: CT ANGIOGRAPHY CHEST WITH CONTRAST TECHNIQUE: Multidetector CT imaging of the chest was performed using the standard protocol during bolus administration of intravenous contrast. Multiplanar CT image reconstructions and MIPs were obtained to evaluate the vascular anatomy. CONTRAST:  119m ISOVUE-370 IOPAMIDOL (ISOVUE-370) INJECTION 76% COMPARISON:  Chest x-ray from today and CT chest of 05/02/2017 FINDINGS: Cardiovascular: There is good opacification of the pulmonary arteries. There is no evidence of pulmonary embolism. The right pulmonary artery does narrow as a courses through the right infrahilar opacity possibly a mass or adenopathy. Also the pulmonary artery branches to the right lower lobe do opacify into the atelectatic right lower lobe with no filling defect noted. The heart is moderately enlarged. No pericardial effusion is seen. Coronary artery calcifications are present primarily in the distribution of the left anterior descending coronary artery. The midthoracic aorta measures 32 mm in diameter. Moderate thoracic aortic atherosclerosis is present. Mediastinum/Nodes: Only small mediastinal lymph nodes are present. There is abnormal soft tissue of the subcarinal region toward extending to the right infrahilar region which may represent infiltration of mass. The thyroid gland is unremarkable. No hiatal hernia is seen. Lungs/Pleura: On lung window images, there are diffuse emphysematous changes present. No suspicious lung nodule is seen. However there is partial collapse of the right lower lobe which appears to be due to occlusion of the bronchus to the right lower lobe presumably due to right infrahilar mass. No definite pleural effusion is seen. Upper Abdomen: On delayed images, there are multiple liver lesions of varying sizes consistent with diffuse  liver metastases. The majority of these larger lesions are present near the porta hepatis. Musculoskeletal: There are diffuse sclerotic bone metastases is noted. Sclerotic foci are present throughout multiple thoracic vertebral bodies consistent with diffuse metastases. No compression deformity is seen. Review of the MIP images confirms the above findings. IMPRESSION: 1. No evidence of acute pulmonary embolism. 2. Collapse of the right lower lobe probably due to infrahilar infiltrative mass and atelectasis. 3. Diffuse liver metastases. 4. Multiple sclerotic bone lesions consistent with diffuse skeletal metastases. 5. Coronary artery calcifications. Electronically Signed   By: PIvar DrapeM.D.   On: 06/18/2018 15:37   Ct Abdomen Pelvis W Contrast  Result Date: 06/18/2018 CLINICAL DATA:  Shortness of breath EXAM: CT ABDOMEN AND PELVIS WITH CONTRAST TECHNIQUE: Multidetector CT imaging of the abdomen and pelvis was performed using the standard protocol following bolus administration of intravenous contrast. CONTRAST:  1090mISOVUE-370 IOPAMIDOL (ISOVUE-370) INJECTION 76% COMPARISON:  CT scan 04/12/2018 FINDINGS: Lower chest: Persistent right lower lobe collapse. No definite pulmonary nodules. No pleural effusions. Stable emphysematous changes. Hepatobiliary: Significant progression of hepatic metastatic disease, suggesting very aggressive disease. Right hepatic lobe lesion on image 28 of series 9 measures 6.0 x 4.5 cm and previously measured 2.3 x 2.2 cm. Segment 7 lesion on image number 15 of series 9 measures 3.7 x 3.6 cm and previously measured a maximum 19 mm. Caudate lobe lesion measures 4.3 x 4.1 cm and previously measured a maximum of 9 mm. Segment  3 lesion on image number 36 measures 5.5 x 3.1 cm and previously measured a maximum of 16 mm. Innumerable new hepatic metastatic lesions. Biliary stent is in place. The gallbladder is grossly normal. The portal vein is patent. Pancreas: No mass, inflammation.   Mild stable ductal dilatation. Spleen: Normal size.  No focal lesions. 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 The right adrenal gland is normal. Both kidneys are unremarkable and stable. The bladder is unremarkable. Stomach/Bowel: The stomach, duodenum, small bowel and colon are grossly normal. No acute inflammatory changes, mass lesions or obstructive findings. Vascular/Lymphatic: Advanced atherosclerotic calcifications involving the aorta and branch vessel ostia. No aneurysm or dissection. The major venous structures are patent. Small scattered mesenteric and retroperitoneal lymph nodes. Left para-aortic node on image number 33 measures 16.5 x 12 mm and was not present on the prior study. Progressive periportal lymphadenopathy. Reproductive: The uterus and ovaries are unremarkable. 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 Other: Small to moderate free pelvic fluid. No pelvic mass or adenopathy. No inguinal adenopathy. Sebaceous cyst noted in the right pubic area. Musculoskeletal: Overall stable diffuse lytic and sclerotic metastatic bone disease. Stable appearing pathologic fracture of L2 with retropulsion and mild canal compromise. No new pathologic fractures are demonstrated. IMPRESSION: 1. Markedly progressive hepatic metastatic disease. 2. Enlarging left adrenal gland metastasis. 3. Progressive periportal and retroperitoneal adenopathy. 4. Stable appearing diffuse lytic and sclerotic metastatic bone disease with a stable appearing pathologic fracture of L2. No progressive spinal canal compromise. Electronically Signed   By: Marijo Sanes M.D.   On: 06/18/2018 15:46   None  Pending Labs Unresulted Labs (From admission, onward)    Start     Ordered   06/19/18 0500  Lipase, blood  Tomorrow morning,   R     06/18/18 1702   06/19/18 0500  CBC  Tomorrow morning,   R     06/18/18 1702   06/19/18  0500  Comprehensive metabolic panel  Tomorrow morning,   R     06/18/18 1702   06/19/18 0500  Magnesium  Tomorrow morning,   R     06/18/18 1702          Vitals/Pain Today's Vitals   06/18/18 1248 06/18/18 1257 06/18/18 1500 06/18/18 1617  BP: 100/72  (!) 150/89 134/88  Pulse: 97  (!) 56 62  Resp: 16  16 19   Temp: (!) 97.5 F (36.4 C)     TempSrc: Oral     SpO2: 94%  94% 92%  Weight: 50.3 kg     Height: 5' 3"  (1.6 m)     PainSc:  6       Isolation Precautions No active isolations  Medications Medications  sodium chloride 0.9 % injection (has no administration in time range)  iopamidol (ISOVUE-370) 76 % injection (has no administration in time range)  0.45 % sodium chloride infusion (has no administration in time range)  magnesium sulfate IVPB 2 g 50 mL (has no administration in time range)  potassium chloride 10 mEq in 100 mL IVPB (has no administration in time range)  enoxaparin (LOVENOX) injection 40 mg (has no administration in time range)  sodium chloride 0.9 % bolus 1,000 mL (1,000 mLs Intravenous New Bag/Given 06/18/18 1410)  ondansetron (ZOFRAN) injection 4 mg (4 mg Intravenous Given 06/18/18 1410)  iopamidol (ISOVUE-370) 76 % injection 100 mL (100 mLs Intravenous Contrast Given 06/18/18 1510)  potassium chloride SA (  K-DUR,KLOR-CON) CR tablet 40 mEq (40 mEq Oral Given 06/18/18 1556)    Mobility walks with person assist

## 2018-06-18 NOTE — Progress Notes (Signed)
Pharmacy Antibiotic Note  Haley [name] is a 67 y.o. female admitted on 06/18/2018 with SOB and abdominal pain. Lactic acid elevated, rising. MD starting patient empirically on Vancomycin and Cefepime for possible PNA.  Pharmacy has been consulted for antibiotic dosing.  Plan: Vancomycin 1g IV x 1, then 750mg  IV q24h. Vancomycin levels at steady state, as indicated. Cefepime 1g IV q8h. Monitor renal function, cultures, clinical course.   Height: 5\' 3"  (160 cm) Weight: 111 lb (50.3 kg) IBW/kg (Calculated) : 52.4  Temp (24hrs), Avg:97.8 F (36.6 C), Min:97.5 F (36.4 C), Max:98 F (36.7 C)  Recent Labs  Lab 06/18/18 1414 06/18/18 1421 06/18/18 1422 06/18/18 1700  WBC 9.6  --   --   --   CREATININE 0.66 0.60  --   --   LATICACIDVEN  --   --  2.82* 3.21*    Estimated Creatinine Clearance: 55.7 mL/min (by C-G formula based on SCr of 0.6 mg/dL).    No Known Allergies  Antimicrobials this admission: 10/24 Vancomycin >> 10/24 Cefepime >>  Dose adjustments this admission: --  Microbiology results: None ordered  Thank you for allowing pharmacy to be a part of this patient's care.   Lindell Spar, PharmD, BCPS Pager: [phone] 06/18/2018 6:20 PM

## 2018-06-18 NOTE — ED Notes (Signed)
Ed provider [place] notified patient has critical lactic acid value of 2.82

## 2018-06-18 NOTE — ED Provider Notes (Signed)
[facility] DEPT Provider Note   CSN: [id number] Arrival date & time: 06/18/18  1240     History   Chief Complaint Chief Complaint  Patient presents with  . Shortness of Breath  . Abdominal Pain    HPI Haley [name] is a 67 y.o. female.  Patient complains of some shortness of breath and abdominal pain for a few days.  She has not been eating or drinking much.  Patient has metastatic lung cancer and has been treated with radiation and chemo  The history is provided by the patient. No language interpreter was used.  Abdominal Pain   This is a new problem. The current episode started more than 2 days ago. The problem occurs constantly. The pain is associated with an unknown factor. The pain is located in the generalized abdominal region. The quality of the pain is aching. The pain is at a severity of 4/10. The pain is moderate. Associated symptoms include anorexia. Pertinent negatives include diarrhea, frequency, hematuria and headaches. Nothing aggravates the symptoms. Nothing relieves the symptoms. Past workup does not include ultrasound. Her past medical history does not include ulcerative colitis.    Past Medical History:  Diagnosis Date  . Allergy   . Back pain   . Osteopenia   . 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 Tubular adenoma of colon 10/26/2014    Patient Active Problem List   Diagnosis Date Noted  . Adult failure to thrive   . Counseling regarding advance care planning and goals of care   . Bacteremia due to Pseudomonas 04/16/2018  . Abnormal head CT 04/13/2018  . Adrenal nodule ([place]) 04/13/2018  . Hyponatremia 04/13/2018  . Hyperammonemia ([place]) 04/13/2018  . Lactic acidosis 04/13/2018  . Thrombocytosis ([place]) 04/13/2018  . Sepsis ([place]) 04/13/2018  . Hypotension 04/13/2018  . Protein-calorie malnutrition, severe 04/13/2018  . Abnormal liver function   . Generalized  abdominal pain   . Malnutrition of moderate degree 03/02/2018  . Dehydration 03/01/2018  . Nausea & vomiting 03/01/2018  . Brain metastasis ([place]) 01/08/2018  . HCAP (healthcare-associated pneumonia) 12/19/2017  . 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 Bone metastasis ([place]) 10/22/2017  . Counseling regarding advanced care planning and goals of care 10/22/2017  . Liver metastases ([place])   . 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 Metastatic disease ([place])   . Jaundice 10/13/2017  . Mood disorder ([place]) 05/12/2017  . Osteoarthritis 05/12/2017  . Osteopenia 05/06/2016  . Eustachian tube dysfunction 07/03/2015  . Allergic rhinitis 07/03/2015  . Tobacco use disorder 07/03/2015  . Hyperlipidemia 07/03/2015    Past Surgical History:  Procedure Laterality Date  . BILIARY STENT PLACEMENT N/A 04/14/2018   Procedure: BILIARY STENT PLACEMENT;  Surgeon: [name], MD;  Location: [facility];  Service: Gastroenterology;  Laterality: N/A;  . COLONOSCOPY    . COLONOSCOPY WITH PROPOFOL N/A 10/25/2014   Procedure: COLONOSCOPY WITH PROPOFOL;  Surgeon: Garlan Fair, MD;  Location: [facility]OS[facility];  Service: Endoscopy;  Laterality: N/A;  . ERCP N/A 10/15/2017   Procedure: ENDOSCOPIC RETROGRADE CHOLANGIOPANCREATOGRAPHY (ERCP);  Surgeon: Clarene Essex, MD;  Location: [facility];  Service: Endoscopy;  Laterality: N/A;  . ERCP N/A 04/14/2018   Procedure: ENDOSCOPIC RETROGRADE CHOLANGIOPANCREATOGRAPHY (ERCP);  Surgeon: [name], MD;  Location: [facility];  Service: Gastroenterology;  Laterality: N/A;  with stent change and Balloon Sweep  . IR FLUORO GUIDE PORT INSERTION  RIGHT  11/10/2017  . IR RADIOLOGIST EVAL & MGMT  10/02/2017  . IR US GUIDE VASC ACCESS RIGHT  11/10/2017  . PORT-A-CATH REMOVAL N/A 04/16/2018   Procedure: REMOVAL PORT-A-CATH;  Surgeon: Clovis Riley, MD;  Location: [facility];  Service: General;  Laterality: N/A;     OB History   None      Home  Medications    Prior to Admission medications   Medication Sig Start Date End Date Taking? Authorizing Provider  amitriptyline (ELAVIL) 25 MG tablet Take one tablet (25 mg) at bedtime for 2 weeks, then increase to 2 tablets (50 mg) at bedtime Patient taking differently: Take 25-50 mg by mouth See admin instructions. Take 25 mg at bedtime for 2 weeks, then increase to 50 mg at bedtime 06/08/18  Yes Arfeen, Arlyce Harman, MD  calcium carbonate (OS-CAL - DOSED IN MG OF ELEMENTAL CALCIUM) 1250 (500 Ca) MG tablet Take 1,250 mg by mouth daily.    Yes [provider]  cholecalciferol (VITAMIN D) 1000 units tablet Take 1,000 Units by mouth daily.   Yes [provider]  famotidine (PEPCID AC) 10 MG chewable tablet Chew 10 mg by mouth daily.    Yes [provider]  hydrocortisone (CORTEF) 10 MG tablet TAKE 2 TABLETS (20 MG TOTAL) BY MOUTH DAILY WITH BREAKFAST AND 1 TABLET DAILY WITH LUNCH. Patient taking differently: Take 10-20 mg by mouth See admin instructions. Take 20 mg by mouth in the morning and 10 mg by mouth at lunch 06/05/18  Yes Kale, Cloria Spring, MD  Multiple Vitamins-Minerals (MULTIVITAMIN WITH MINERALS) tablet Take 1 tablet by mouth daily.   Yes [provider]  ondansetron (ZOFRAN) 4 MG tablet Take 1 tablet (4 mg total) by mouth every 8 (eight) hours as needed for nausea or vomiting. 06/08/18  Yes Brunetta Genera, MD  oxyCODONE (OXY IR/ROXICODONE) 5 MG immediate release tablet Take 1-2 tablets (5-10 mg total) by mouth every 4 (four) hours as needed for moderate pain or severe pain. 06/08/18  Yes Brunetta Genera, MD  polyethylene glycol [facility]) packet Take 17 g by mouth daily as needed for moderate constipation. 10/18/17  Yes Bonnielee Haff, MD  senna-docusate (SENNA S) 8.6-50 MG tablet Take 2 tablets by mouth at bedtime. Patient taking differently: Take 2 tablets by mouth at bedtime as needed for mild constipation.  09/30/17  Yes Brunetta Genera, MD    sodium chloride 1 g tablet Take 5 tablets (5 g total) by mouth 2 (two) times daily with a meal. Patient taking differently: Take 3-4 g by mouth See admin instructions. Take 3 g by mouth in the morning, 3 g by mouth at lunch and 4 g by mouth in the evening 05/11/18  Yes Renato Shin, MD  dronabinol (MARINOL) 2.5 MG capsule Take 1 capsule (2.5 mg total) by mouth 2 (two) times daily before lunch and supper. Patient not taking: Reported on 06/18/2018 05/05/18   Patrecia Pour, Christean Grief, MD  DULoxetine (CYMBALTA) 20 MG capsule Take one capsule daily for 2 week and than two capsule daily Patient not taking: Reported on 06/18/2018 06/02/18   Arfeen, Arlyce Harman, MD  metoCLOPramide (REGLAN) 10 MG tablet Take 1 tablet (10 mg total) by mouth every 8 (eight) hours as needed for nausea. Patient not taking: Reported on 05/21/2018 03/16/18   Harle Stanford., PA-C  prochlorperazine (COMPAZINE) 10 MG tablet Take 1 tablet (10 mg total) by mouth every 6 (six) hours as needed (Nausea or vomiting). 10/22/17 05/22/18  Brunetta Genera, MD    Family History Family History  Problem Relation Age of Onset  . Diabetes Mother   . Hypertension Mother   . Diabetes Sister   . Hypertension Sister   . Hyperlipidemia Sister   . Adrenal disorder Neg Hx     Social History Social History   Tobacco Use  . Smoking status: Former Smoker    Packs/day: 1.00    Years: 40.00    Pack years: 40.00    Types: Cigarettes    Last attempt to quit: 09/24/2017    Years since quitting: 0.7  . Smokeless tobacco: Never Used  Substance Use Topics  . Alcohol use: Never    Frequency: Never  . Drug use: No     Allergies   Patient has no known allergies.   Review of Systems Review of Systems  Constitutional: Negative for appetite change and fatigue.  HENT: Negative for congestion, ear discharge and sinus pressure.   Eyes: Negative for discharge.  Respiratory: Positive for shortness of breath. Negative for cough.   Cardiovascular:  Negative for chest pain.  Gastrointestinal: Positive for abdominal pain and anorexia. Negative for diarrhea.  Genitourinary: Negative for frequency and hematuria.  Musculoskeletal: Negative for back pain.  Skin: Negative for rash.  Neurological: Negative for seizures and headaches.  Psychiatric/Behavioral: Negative for hallucinations.     Physical Exam Updated Vital Signs BP (!) 150/89 (BP Location: Right Arm)   Pulse (!) 56   Temp (!) 97.5 F (36.4 C) (Oral)   Resp 16   Ht 5\' 3"  (1.6 m)   Wt 50.3 kg   SpO2 94%   BMI 19.66 kg/m   Physical Exam  Constitutional: She is oriented to person, place, and time. She appears well-developed.  HENT:  Head: Normocephalic.  Eyes: Conjunctivae and EOM are normal. No scleral icterus.  Neck: Neck supple. No thyromegaly present.  Cardiovascular: Normal rate and regular rhythm. Exam reveals no gallop and no friction rub.  No murmur heard. Pulmonary/Chest: No stridor. She has no wheezes. She has no rales. She exhibits no tenderness.  Abdominal: She exhibits no distension. There is tenderness. There is no rebound.  Musculoskeletal: Normal range of motion. She exhibits no edema.  Lymphadenopathy:    She has no cervical adenopathy.  Neurological: She is oriented to person, place, and time. She exhibits normal muscle tone. Coordination normal.  Skin: No rash noted. No erythema.  Psychiatric: She has a normal mood and affect. Her behavior is normal.     ED Treatments / Results  Labs (all labs ordered are listed, but only abnormal results are displayed) Labs Reviewed  CBC WITH DIFFERENTIAL/PLATELET - Abnormal; Notable for the following components:      Result Value   RDW 18.3 (*)    nRBC 0.5 (*)    Abs Immature Granulocytes 0.11 (*)    All other components within normal limits  COMPREHENSIVE METABOLIC PANEL - Abnormal; Notable for the following components:   Potassium 3.0 (*)    Glucose, Bld 131 (*)    BUN 27 (*)    Calcium 8.3 (*)     Total Protein 6.3 (*)    Albumin 2.9 (*)    AST 251 (*)    ALT 105 (*)    Alkaline Phosphatase 400 (*)    Total Bilirubin 1.8 (*)    All other components within normal limits  LIPASE, BLOOD - Abnormal; Notable for the following components:   Lipase 80 (*)    All  other components within normal limits  I-STAT CHEM 8, ED - Abnormal; Notable for the following components:   Potassium 2.9 (*)    BUN 26 (*)    Glucose, Bld 126 (*)    Calcium, Ion 1.02 (*)    All other components within normal limits  I-STAT CG4 LACTIC ACID, ED - Abnormal; Notable for the following components:   Lactic Acid, Venous 2.82 (*)    All other components within normal limits  I-STAT CG4 LACTIC ACID, ED    EKG None  Radiology Dg Chest 2 View  Result Date: 06/18/2018 CLINICAL DATA:  Short of breath, nausea, abdominal pain, history of lung carcinoma EXAM: CHEST - 2 VIEW COMPARISON:  CT abdomen pelvis of 05/02/2018 and chest x-ray of 05/13/2017 FINDINGS: There is opacity at the right lung base which may represent atelectasis and/or pneumonia. The left lung is clear. Mediastinal and hilar contours are unremarkable. The heart is mildly enlarged. No bony abnormality is seen. IMPRESSION: Abnormal opacity at the right lung base may be due to atelectasis and possibly pneumonia in this patient with known right infrahilar lesion. Electronically Signed   By: Ivar Drape M.D.   On: 06/18/2018 14:07   Ct Angio Chest Pe W And/or Wo Contrast  Result Date: 06/18/2018 CLINICAL DATA:  Short of breath, abdominal pain, history of lung carcinoma EXAM: CT ANGIOGRAPHY CHEST WITH CONTRAST TECHNIQUE: Multidetector CT imaging of the chest was performed using the standard protocol during bolus administration of intravenous contrast. Multiplanar CT image reconstructions and MIPs were obtained to evaluate the vascular anatomy. CONTRAST:  135mL ISOVUE-370 IOPAMIDOL (ISOVUE-370) INJECTION 76% COMPARISON:  Chest x-ray from today and CT chest of  05/02/2017 FINDINGS: Cardiovascular: There is good opacification of the pulmonary arteries. There is no evidence of pulmonary embolism. The right pulmonary artery does narrow as a courses through the right infrahilar opacity possibly a mass or adenopathy. Also the pulmonary artery branches to the right lower lobe do opacify into the atelectatic right lower lobe with no filling defect noted. The heart is moderately enlarged. No pericardial effusion is seen. Coronary artery calcifications are present primarily in the distribution of the left anterior descending coronary artery. The midthoracic aorta measures 32 mm in diameter. Moderate thoracic aortic atherosclerosis is present. Mediastinum/Nodes: Only small mediastinal lymph nodes are present. There is abnormal soft tissue of the subcarinal region toward extending to the right infrahilar region which may represent infiltration of mass. The thyroid gland is unremarkable. No hiatal hernia is seen. Lungs/Pleura: On lung window images, there are diffuse emphysematous changes present. No suspicious lung nodule is seen. However there is partial collapse of the right lower lobe which appears to be due to occlusion of the bronchus to the right lower lobe presumably due to right infrahilar mass. No definite pleural effusion is seen. Upper Abdomen: On delayed images, there are multiple liver lesions of varying sizes consistent with diffuse liver metastases. The majority of these larger lesions are present near the porta hepatis. Musculoskeletal: There are diffuse sclerotic bone metastases is noted. Sclerotic foci are present throughout multiple thoracic vertebral bodies consistent with diffuse metastases. No compression deformity is seen. Review of the MIP images confirms the above findings. IMPRESSION: 1. No evidence of acute pulmonary embolism. 2. Collapse of the right lower lobe probably due to infrahilar infiltrative mass and atelectasis. 3. Diffuse liver metastases. 4.  Multiple sclerotic bone lesions consistent with diffuse skeletal metastases. 5. Coronary artery calcifications. Electronically Signed   By: Windy Canny.D.  On: 06/18/2018 15:37   Ct Abdomen Pelvis W Contrast  Result Date: 06/18/2018 CLINICAL DATA:  Shortness of breath EXAM: CT ABDOMEN AND PELVIS WITH CONTRAST TECHNIQUE: Multidetector CT imaging of the abdomen and pelvis was performed using the standard protocol following bolus administration of intravenous contrast. CONTRAST:  179mL ISOVUE-370 IOPAMIDOL (ISOVUE-370) INJECTION 76% COMPARISON:  CT scan 04/12/2018 FINDINGS: Lower chest: Persistent right lower lobe collapse. No definite pulmonary nodules. No pleural effusions. Stable emphysematous changes. Hepatobiliary: Significant progression of hepatic metastatic disease, suggesting very aggressive disease. Right hepatic lobe lesion on image 28 of series 9 measures 6.0 x 4.5 cm and previously measured 2.3 x 2.2 cm. Segment 7 lesion on image number 15 of series 9 measures 3.7 x 3.6 cm and previously measured a maximum 19 mm. Caudate lobe lesion measures 4.3 x 4.1 cm and previously measured a maximum of 9 mm. Segment 3 lesion on image number 36 measures 5.5 x 3.1 cm and previously measured a maximum of 16 mm. Innumerable new hepatic metastatic lesions. Biliary stent is in place. The gallbladder is grossly normal. The portal vein is patent. Pancreas: No mass, inflammation.  Mild stable ductal dilatation. Spleen: Normal size.  No focal lesions. 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 The right adrenal gland is normal. Both kidneys are unremarkable and stable. The bladder is unremarkable. Stomach/Bowel: The stomach, duodenum, small bowel and colon are grossly normal. No acute inflammatory changes, mass lesions or obstructive findings. Vascular/Lymphatic: Advanced atherosclerotic calcifications involving the aorta and branch vessel  ostia. No aneurysm or dissection. The major venous structures are patent. Small scattered mesenteric and retroperitoneal lymph nodes. Left para-aortic node on image number 33 measures 16.5 x 12 mm and was not present on the prior study. Progressive periportal lymphadenopathy. Reproductive: The uterus and ovaries are unremarkable. 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 Other: Small to moderate free pelvic fluid. No pelvic mass or adenopathy. No inguinal adenopathy. Sebaceous cyst noted in the right pubic area. Musculoskeletal: Overall stable diffuse lytic and sclerotic metastatic bone disease. Stable appearing pathologic fracture of L2 with retropulsion and mild canal compromise. No new pathologic fractures are demonstrated. IMPRESSION: 1. Markedly progressive hepatic metastatic disease. 2. Enlarging left adrenal gland metastasis. 3. Progressive periportal and retroperitoneal adenopathy. 4. Stable appearing diffuse lytic and sclerotic metastatic bone disease with a stable appearing pathologic fracture of L2. No progressive spinal canal compromise. Electronically Signed   By: Marijo Sanes M.D.   On: 06/18/2018 15:46    Procedures Procedures (including critical care time)  Medications Ordered in ED Medications  sodium chloride 0.9 % injection (has no administration in time range)  iopamidol (ISOVUE-370) 76 % injection (has no administration in time range)  sodium chloride 0.9 % bolus 1,000 mL (1,000 mLs Intravenous New Bag/Given 06/18/18 1410)  ondansetron (ZOFRAN) injection 4 mg (4 mg Intravenous Given 06/18/18 1410)  iopamidol (ISOVUE-370) 76 % injection 100 mL (100 mLs Intravenous Contrast Given 06/18/18 1510)  potassium chloride SA (K-DUR,KLOR-CON) CR tablet 40 mEq (40 mEq Oral Given 06/18/18 1556)     Initial Impression / Assessment and Plan / ED Course  I have reviewed the triage vital signs and the nursing notes.  Pertinent labs & imaging results that were  available during my care of the patient were reviewed by me and considered in my medical decision making (see chart for details). CRITICAL CARE Performed by: Milton Ferguson Total critical care time: 40 minutes Critical care  time was exclusive of separately billable procedures and treating other patients. Critical care was necessary to treat or prevent imminent or life-threatening deterioration. Critical care was time spent personally by me on the following activities: development of treatment plan with patient and/or surrogate as well as nursing, discussions with consultants, evaluation of patient's response to treatment, examination of patient, obtaining history from patient or surrogate, ordering and performing treatments and interventions, ordering and review of laboratory studies, ordering and review of radiographic studies, pulse oximetry and re-evaluation of patient's condition.    CT scan shows worsening lung cancer and her abdomen.  Also collapse of right lower lobe from the cancer.  Patient is hypokalemic and dehydrated she will be admitted by medicine and oncology will consult  Final Clinical Impressions(s) / ED Diagnoses   Final diagnoses:  Lower abdominal pain    ED Discharge Orders    None       Milton Ferguson, MD 06/18/18 1607

## 2018-06-18 NOTE — ED Notes (Signed)
ED Provider at bedside. 

## 2018-06-18 NOTE — ED Triage Notes (Signed)
Patient c/o SOB and abdominal pain in the past week. Patient has a history of lung cancer.

## 2018-06-18 NOTE — H&P (Addendum)
History and Physical    Haley [name] MWU:[id number] DOB: [birth year] DOA: 06/18/2018  PCP: Alycia Rossetti, MD  Patient coming from: Home   Chief Complaint: Shortness of breath and abdominal pain  HPI: Haley [name] is a 67 y.o. female with medical history significant of metastatic small cell lung cancer with mets to the liver and bone admitted with complaints of increasing shortness of breath.denies any cough fever chills does not have oxygen at home.  Abdominal pain has been worse for the last few days with decreased appetite and poor p.o. intake with nausea but has not had any vomiting or diarrhea.  Patient lives at home with family.  She has been treated with chemo and radiation.  Dr. Irene Limbo is her oncologist he is aware of patient's admission ED physician spoke to him.  She denies any urinary complaints.  She reports her abdominal pain gets worse when she eats.  She does have a history of ulcerative colitis.  Denies any hematuria hematochezia or melena.  Her main complaint today is generalized weakness not been able to move around to walk around inside the house much.  ED Course: Received Zofran and IV fluid bolus x1. Blood pressure 1 3488 pulse is 62 respiration 19 sats 92% on room air.  Sodium 141 potassium 2.9 lactic acid level 2.82 initial, white count 9.6 hemoglobin 13.6 platelet count 174.  Chest x-ray shows abnormal opacity in the right lung right lung base may be due to atelectasis or possible pneumonia.  CT scan of the abdomen and pelvis shows moderate to markedly progressive hepatic metastatic disease with enlarged left adrenal gland met stasis progressive periportal and retroperitoneal adenopathy, stable appearing diffuse lytic and sclerotic metastatic bone disease with stable appearing pathology occult fracture of L2 no spinal canal compromise.  CT angiogram of the chest no evidence of acute pulmonary embolism collapse of the right lower lobe due to infrahilar infiltrative mass  versus atelectasis diffuse liver mets. Lipase was 80 Review of Systems:    Past Medical History:  Diagnosis Date  . Allergy   . Back pain   . Osteopenia   . Small cell lung cancer ([place]) 10/17/2017   Extended stage with metastases to liver and bone  . Tubular adenoma of colon 10/26/2014    Past Surgical History:  Procedure Laterality Date  . BILIARY STENT PLACEMENT N/A 04/14/2018   Procedure: BILIARY STENT PLACEMENT;  Surgeon: [name], MD;  Location: [facility];  Service: Gastroenterology;  Laterality: N/A;  . COLONOSCOPY    . COLONOSCOPY WITH PROPOFOL N/A 10/25/2014   Procedure: COLONOSCOPY WITH PROPOFOL;  Surgeon: Garlan Fair, MD;  Location: [facility];  Service: Endoscopy;  Laterality: N/A;  . ERCP N/A 10/15/2017   Procedure: ENDOSCOPIC RETROGRADE CHOLANGIOPANCREATOGRAPHY (ERCP);  Surgeon: Clarene Essex, MD;  Location: [facility];  Service: Endoscopy;  Laterality: N/A;  . ERCP N/A 04/14/2018   Procedure: ENDOSCOPIC RETROGRADE CHOLANGIOPANCREATOGRAPHY (ERCP);  Surgeon: [name], MD;  Location: [facility];  Service: Gastroenterology;  Laterality: N/A;  with stent change and Balloon Sweep  . IR FLUORO GUIDE PORT INSERTION RIGHT  11/10/2017  . IR RADIOLOGIST EVAL & MGMT  10/02/2017  . IR US GUIDE VASC ACCESS RIGHT  11/10/2017  . PORT-A-CATH REMOVAL N/A 04/16/2018   Procedure: REMOVAL PORT-A-CATH;  Surgeon: Clovis Riley, MD;  Location: [facility];  Service: General;  Laterality: N/A;     reports that she quit smoking about 8 months ago. Her smoking use included cigarettes. She has a 40.00  pack-year smoking history. She has never used smokeless tobacco. She reports that she does not drink alcohol or use drugs.  No Known Allergies  Family History  Problem Relation Age of Onset  . Diabetes Mother   . Hypertension Mother   . Diabetes Sister   . Hypertension Sister   . Hyperlipidemia Sister   . Adrenal disorder Neg Hx      Prior to Admission medications   Medication  Sig Start Date End Date Taking? Authorizing Provider  amitriptyline (ELAVIL) 25 MG tablet Take one tablet (25 mg) at bedtime for 2 weeks, then increase to 2 tablets (50 mg) at bedtime Patient taking differently: Take 25-50 mg by mouth See admin instructions. Take 25 mg at bedtime for 2 weeks, then increase to 50 mg at bedtime 06/08/18  Yes Arfeen, Arlyce Harman, MD  calcium carbonate (OS-CAL - DOSED IN MG OF ELEMENTAL CALCIUM) 1250 (500 Ca) MG tablet Take 1,250 mg by mouth daily.    Yes [provider]  cholecalciferol (VITAMIN D) 1000 units tablet Take 1,000 Units by mouth daily.   Yes [provider]  famotidine (PEPCID AC) 10 MG chewable tablet Chew 10 mg by mouth daily.    Yes [provider]  hydrocortisone (CORTEF) 10 MG tablet TAKE 2 TABLETS (20 MG TOTAL) BY MOUTH DAILY WITH BREAKFAST AND 1 TABLET DAILY WITH LUNCH. Patient taking differently: Take 10-20 mg by mouth See admin instructions. Take 20 mg by mouth in the morning and 10 mg by mouth at lunch 06/05/18  Yes Kale, Cloria Spring, MD  Multiple Vitamins-Minerals (MULTIVITAMIN WITH MINERALS) tablet Take 1 tablet by mouth daily.   Yes [provider]  ondansetron (ZOFRAN) 4 MG tablet Take 1 tablet (4 mg total) by mouth every 8 (eight) hours as needed for nausea or vomiting. 06/08/18  Yes Brunetta Genera, MD  oxyCODONE (OXY IR/ROXICODONE) 5 MG immediate release tablet Take 1-2 tablets (5-10 mg total) by mouth every 4 (four) hours as needed for moderate pain or severe pain. 06/08/18  Yes Brunetta Genera, MD  polyethylene glycol [facility]) packet Take 17 g by mouth daily as needed for moderate constipation. 10/18/17  Yes Bonnielee Haff, MD  senna-docusate (SENNA S) 8.6-50 MG tablet Take 2 tablets by mouth at bedtime. Patient taking differently: Take 2 tablets by mouth at bedtime as needed for mild constipation.  09/30/17  Yes Brunetta Genera, MD  sodium chloride 1 g tablet Take 5 tablets (5 g total) by  mouth 2 (two) times daily with a meal. Patient taking differently: Take 3-4 g by mouth See admin instructions. Take 3 g by mouth in the morning, 3 g by mouth at lunch and 4 g by mouth in the evening 05/11/18  Yes Renato Shin, MD  dronabinol (MARINOL) 2.5 MG capsule Take 1 capsule (2.5 mg total) by mouth 2 (two) times daily before lunch and supper. Patient not taking: Reported on 06/18/2018 05/05/18   Patrecia Pour, Christean Grief, MD  DULoxetine (CYMBALTA) 20 MG capsule Take one capsule daily for 2 week and than two capsule daily Patient not taking: Reported on 06/18/2018 06/02/18   Arfeen, Arlyce Harman, MD  metoCLOPramide (REGLAN) 10 MG tablet Take 1 tablet (10 mg total) by mouth every 8 (eight) hours as needed for nausea. Patient not taking: Reported on 05/21/2018 03/16/18   Harle Stanford., PA-C  prochlorperazine (COMPAZINE) 10 MG tablet Take 1 tablet (10 mg total) by mouth every 6 (six) hours as needed (Nausea or vomiting). 10/22/17 05/22/18  Brunetta Genera, MD    Physical Exam: Vitals:   06/18/18 1248 06/18/18 1500 06/18/18 1617  BP: 100/72 (!) 150/89 134/88  Pulse: 97 (!) 56 62  Resp: _0 Temp: (!) 97.5 F (36.4 C)    TempSrc: Oral    SpO2: 94% 94% 92%  Weight: 50.3 kg    Height: _1  (1.6 m)      Constitutional: NAD, calm, comfortable Vitals:   06/18/18 1248 06/18/18 1500 06/18/18 1617  BP: 100/72 (!) 150/89 134/88  Pulse: 97 (!) 56 62  Resp: _2 Temp: (!) 97.5 F (36.4 C)    TempSrc: Oral    SpO2: 94% 94% 92%  Weight: 50.3 kg    Height: _3  (1.6 m)     Eyes: PERRL, lids and conjunctivae normal ENMT: Mucous membranes DRY Posterior pharynx clear of any exudate or lesions.Normal dentition.  Neck: normal, supple, no masses, no thyromegaly Respiratory: Few scattered rhonchi auscultation bilaterally, no wheezing, no crackles. Normal respiratory effort. No accessory muscle use.  Cardiovascular: Regular rate and rhythm, no murmurs / rubs / gallops. No extremity edema. 2+  pedal pulses. No carotid bruits.  Abdomen: Periumbilical tenderness, no masses palpated. No hepatosplenomegaly. Bowel sounds positive.  Musculoskeletal: no clubbing / cyanosis. No joint deformity upper and lower extremities. Good ROM, no contractures. Normal muscle tone.  Skin: no rashes, lesions, ulcers. No induration Neurologic: CN 2-12 grossly intact. Sensation intact, DTR normal. Strength 5/5 in all 4.  Psychiatric: Normal judgment and insight. Alert and oriented x 3. Normal mood.   Labs on Admission: I have personally reviewed following labs and imaging studies  CBC: Recent Labs  Lab 06/18/18 1414 06/18/18 1421  WBC 9.6  --   NEUTROABS 7.5  --   HGB 13.2 13.6  HCT 42.8 40.0  MCV 89.4  --   PLT 174  --    Basic Metabolic Panel: Recent Labs  Lab 06/18/18 1414 06/18/18 1421  NA 143 141  K 3.0* 2.9*  CL 103 101  CO2 29  --   GLUCOSE 131* 126*  BUN 27* 26*  CREATININE 0.66 0.60  CALCIUM 8.3*  --    GFR: Estimated Creatinine Clearance: 55.7 mL/min (by C-G formula based on SCr of 0.6 mg/dL). Liver Function Tests: Recent Labs  Lab 06/18/18 1414  AST 251*  ALT 105*  ALKPHOS 400*  BILITOT 1.8*  PROT 6.3*  ALBUMIN 2.9*   Recent Labs  Lab 06/18/18 1414  LIPASE 80*   No results for input(s): AMMONIA in the last 168 hours. Coagulation Profile: No results for input(s): INR, PROTIME in the last 168 hours. Cardiac Enzymes: No results for input(s): CKTOTAL, CKMB, CKMBINDEX, TROPONINI in the last 168 hours. BNP (last 3 results) No results for input(s): PROBNP in the last 8760 hours. HbA1C: No results for input(s): HGBA1C in the last 72 hours. CBG: No results for input(s): GLUCAP in the last 168 hours. Lipid Profile: No results for input(s): CHOL, HDL, LDLCALC, TRIG, CHOLHDL, LDLDIRECT in the last 72 hours. Thyroid Function Tests: No results for input(s): TSH, T4TOTAL, FREET4, T3FREE, THYROIDAB in the last 72 hours. Anemia Panel: No results for input(s):  VITAMINB12, FOLATE, FERRITIN, TIBC, IRON, RETICCTPCT in the last 72 hours. Urine analysis:    Component Value Date/Time   COLORURINE YELLOW 04/23/2018 1001   APPEARANCEUR CLOUDY (A) 04/23/2018 1001   LABSPEC 1.034 04/23/2018 1001   PHURINE 5.5 04/23/2018 1001   GLUCOSEU NEGATIVE 04/23/2018 1001   HGBUR TRACE (  A) 04/23/2018 [place] 04/12/2018 2217   KETONESUR 1+ (A) 04/23/2018 1001   PROTEINUR 2+ (A) 04/23/2018 1001   NITRITE NEGATIVE 04/23/2018 1001   LEUKOCYTESUR TRACE (A) 04/23/2018 1001    Radiological Exams on Admission: Dg Chest 2 View  Result Date: 06/18/2018 CLINICAL DATA:  Short of breath, nausea, abdominal pain, history of lung carcinoma EXAM: CHEST - 2 VIEW COMPARISON:  CT abdomen pelvis of 05/02/2018 and chest x-ray of 05/13/2017 FINDINGS: There is opacity at the right lung base which may represent atelectasis and/or pneumonia. The left lung is clear. Mediastinal and hilar contours are unremarkable. The heart is mildly enlarged. No bony abnormality is seen. IMPRESSION: Abnormal opacity at the right lung base may be due to atelectasis and possibly pneumonia in this patient with known right infrahilar lesion. Electronically Signed   By: Ivar Drape M.D.   On: 06/18/2018 14:07   Ct Angio Chest Pe W And/or Wo Contrast  Result Date: 06/18/2018 CLINICAL DATA:  Short of breath, abdominal pain, history of lung carcinoma EXAM: CT ANGIOGRAPHY CHEST WITH CONTRAST TECHNIQUE: Multidetector CT imaging of the chest was performed using the standard protocol during bolus administration of intravenous contrast. Multiplanar CT image reconstructions and MIPs were obtained to evaluate the vascular anatomy. CONTRAST:  182m ISOVUE-370 IOPAMIDOL (ISOVUE-370) INJECTION 76% COMPARISON:  Chest x-ray from today and CT chest of 05/02/2017 FINDINGS: Cardiovascular: There is good opacification of the pulmonary arteries. There is no evidence of pulmonary embolism. The right pulmonary artery  does narrow as a courses through the right infrahilar opacity possibly a mass or adenopathy. Also the pulmonary artery branches to the right lower lobe do opacify into the atelectatic right lower lobe with no filling defect noted. The heart is moderately enlarged. No pericardial effusion is seen. Coronary artery calcifications are present primarily in the distribution of the left anterior descending coronary artery. The midthoracic aorta measures 32 mm in diameter. Moderate thoracic aortic atherosclerosis is present. Mediastinum/Nodes: Only small mediastinal lymph nodes are present. There is abnormal soft tissue of the subcarinal region toward extending to the right infrahilar region which may represent infiltration of mass. The thyroid gland is unremarkable. No hiatal hernia is seen. Lungs/Pleura: On lung window images, there are diffuse emphysematous changes present. No suspicious lung nodule is seen. However there is partial collapse of the right lower lobe which appears to be due to occlusion of the bronchus to the right lower lobe presumably due to right infrahilar mass. No definite pleural effusion is seen. Upper Abdomen: On delayed images, there are multiple liver lesions of varying sizes consistent with diffuse liver metastases. The majority of these larger lesions are present near the porta hepatis. Musculoskeletal: There are diffuse sclerotic bone metastases is noted. Sclerotic foci are present throughout multiple thoracic vertebral bodies consistent with diffuse metastases. No compression deformity is seen. Review of the MIP images confirms the above findings. IMPRESSION: 1. No evidence of acute pulmonary embolism. 2. Collapse of the right lower lobe probably due to infrahilar infiltrative mass and atelectasis. 3. Diffuse liver metastases. 4. Multiple sclerotic bone lesions consistent with diffuse skeletal metastases. 5. Coronary artery calcifications. Electronically Signed   By: PIvar DrapeM.D.   On:  06/18/2018 15:37   Ct Abdomen Pelvis W Contrast  Result Date: 06/18/2018 CLINICAL DATA:  Shortness of breath EXAM: CT ABDOMEN AND PELVIS WITH CONTRAST TECHNIQUE: Multidetector CT imaging of the abdomen and pelvis was performed using the standard protocol following bolus administration of intravenous contrast. CONTRAST:  133m ISOVUE-370 IOPAMIDOL (ISOVUE-370) INJECTION 76% COMPARISON:  CT scan 04/12/2018 FINDINGS: Lower chest: Persistent right lower lobe collapse. No definite pulmonary nodules. No pleural effusions. Stable emphysematous changes. Hepatobiliary: Significant progression of hepatic metastatic disease, suggesting very aggressive disease. Right hepatic lobe lesion on image 28 of series 9 measures 6.0 x 4.5 cm and previously measured 2.3 x 2.2 cm. Segment 7 lesion on image number 15 of series 9 measures 3.7 x 3.6 cm and previously measured a maximum 19 mm. Caudate lobe lesion measures 4.3 x 4.1 cm and previously measured a maximum of 9 mm. Segment 3 lesion on image number 36 measures 5.5 x 3.1 cm and previously measured a maximum of 16 mm. Innumerable new hepatic metastatic lesions. Biliary stent is in place. The gallbladder is grossly normal. The portal vein is patent. Pancreas: No mass, inflammation.  Mild stable ductal dilatation. Spleen: Normal size.  No focal lesions. 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 The right adrenal gland is normal. Both kidneys are unremarkable and stable. The bladder is unremarkable. Stomach/Bowel: The stomach, duodenum, small bowel and colon are grossly normal. No acute inflammatory changes, mass lesions or obstructive findings. Vascular/Lymphatic: Advanced atherosclerotic calcifications involving the aorta and branch vessel ostia. No aneurysm or dissection. The major venous structures are patent. Small scattered mesenteric and retroperitoneal lymph nodes. Left para-aortic node on image  number 33 measures 16.5 x 12 mm and was not present on the prior study. Progressive periportal lymphadenopathy. Reproductive: The uterus and ovaries are unremarkable. 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 Other: Small to moderate free pelvic fluid. No pelvic mass or adenopathy. No inguinal adenopathy. Sebaceous cyst noted in the right pubic area. Musculoskeletal: Overall stable diffuse lytic and sclerotic metastatic bone disease. Stable appearing pathologic fracture of L2 with retropulsion and mild canal compromise. No new pathologic fractures are demonstrated. IMPRESSION: 1. Markedly progressive hepatic metastatic disease. 2. Enlarging left adrenal gland metastasis. 3. Progressive periportal and retroperitoneal adenopathy. 4. Stable appearing diffuse lytic and sclerotic metastatic bone disease with a stable appearing pathologic fracture of L2. No progressive spinal canal compromise. Electronically Signed   By: PMarijo SanesM.D.   On: 06/18/2018 15:46    EKG: Independently reviewed.  Assessment/Plan Active Problems:   * No active hospital problems. * #1 metastatic small cell lung CA which has been treated with chemo and radiation with increased worsening mets by CT of the abdomen and pelvis.  Patient admitted with increasing shortness of breath.  Chest x-ray really did not show any evidence of infiltrates.  I will start her on emperic antibiotics in view of the rising lactic acid level  Consider repeating chest x-ray after IV hydration.  She does appear very dry.  Patient may benefit from palliative care consult prior to discharge.  #2 pancreatitis with elevated lipase admitted with abdominal pain with decreased p.o. intake and nausea I will put her on clear liquid diet and IV fluids and repeat lipase.  #3 hypokalemia due to decreased p.o. intake replete and recheck. DVT prophylaxis: Lovenox Code Status: Full code Family Communication: Discussed with daughter-in-law who  was in the room Disposition Plan: Pending clinical improvement Consults called: ED physician spoke with oncology Dr. KIrene LimboAdmission status: Observation  EGeorgette ShellMD [facility]  If 7PM-7AM, please contact night-coverage [email or web address] Password [email or web address] 06/18/2018, 4:48 PM

## 2018-06-18 NOTE — ED Notes (Signed)
RN notified patient had critical lactic acid value of 3.21. Will send admitting provider message to inform them of critical value

## 2018-06-19 DIAGNOSIS — R1084 Generalized abdominal pain: Secondary | ICD-10-CM | POA: Diagnosis present

## 2018-06-19 DIAGNOSIS — C3490 Malignant neoplasm of unspecified part of unspecified bronchus or lung: Secondary | ICD-10-CM

## 2018-06-19 DIAGNOSIS — J9811 Atelectasis: Secondary | ICD-10-CM | POA: Diagnosis not present

## 2018-06-19 DIAGNOSIS — R638 Other symptoms and signs concerning food and fluid intake: Secondary | ICD-10-CM

## 2018-06-19 DIAGNOSIS — G8929 Other chronic pain: Secondary | ICD-10-CM | POA: Diagnosis present

## 2018-06-19 DIAGNOSIS — Z833 Family history of diabetes mellitus: Secondary | ICD-10-CM | POA: Diagnosis not present

## 2018-06-19 DIAGNOSIS — G893 Neoplasm related pain (acute) (chronic): Secondary | ICD-10-CM | POA: Diagnosis present

## 2018-06-19 DIAGNOSIS — Z923 Personal history of irradiation: Secondary | ICD-10-CM | POA: Diagnosis not present

## 2018-06-19 DIAGNOSIS — C7970 Secondary malignant neoplasm of unspecified adrenal gland: Secondary | ICD-10-CM | POA: Diagnosis present

## 2018-06-19 DIAGNOSIS — K859 Acute pancreatitis without necrosis or infection, unspecified: Secondary | ICD-10-CM

## 2018-06-19 DIAGNOSIS — E2740 Unspecified adrenocortical insufficiency: Secondary | ICD-10-CM

## 2018-06-19 DIAGNOSIS — Z8249 Family history of ischemic heart disease and other diseases of the circulatory system: Secondary | ICD-10-CM | POA: Diagnosis not present

## 2018-06-19 DIAGNOSIS — R945 Abnormal results of liver function studies: Secondary | ICD-10-CM

## 2018-06-19 DIAGNOSIS — Z515 Encounter for palliative care: Secondary | ICD-10-CM | POA: Diagnosis not present

## 2018-06-19 DIAGNOSIS — M8458XD Pathological fracture in neoplastic disease, other specified site, subsequent encounter for fracture with routine healing: Secondary | ICD-10-CM | POA: Diagnosis present

## 2018-06-19 DIAGNOSIS — Z7189 Other specified counseling: Secondary | ICD-10-CM | POA: Diagnosis not present

## 2018-06-19 DIAGNOSIS — E872 Acidosis: Secondary | ICD-10-CM | POA: Diagnosis present

## 2018-06-19 DIAGNOSIS — C7951 Secondary malignant neoplasm of bone: Secondary | ICD-10-CM

## 2018-06-19 DIAGNOSIS — Z9221 Personal history of antineoplastic chemotherapy: Secondary | ICD-10-CM | POA: Diagnosis not present

## 2018-06-19 DIAGNOSIS — E876 Hypokalemia: Secondary | ICD-10-CM | POA: Diagnosis present

## 2018-06-19 DIAGNOSIS — R1030 Lower abdominal pain, unspecified: Secondary | ICD-10-CM | POA: Diagnosis present

## 2018-06-19 DIAGNOSIS — C787 Secondary malignant neoplasm of liver and intrahepatic bile duct: Secondary | ICD-10-CM | POA: Diagnosis present

## 2018-06-19 DIAGNOSIS — C7989 Secondary malignant neoplasm of other specified sites: Secondary | ICD-10-CM | POA: Diagnosis present

## 2018-06-19 DIAGNOSIS — C7889 Secondary malignant neoplasm of other digestive organs: Secondary | ICD-10-CM

## 2018-06-19 DIAGNOSIS — R5381 Other malaise: Secondary | ICD-10-CM | POA: Diagnosis present

## 2018-06-19 DIAGNOSIS — Z87891 Personal history of nicotine dependence: Secondary | ICD-10-CM | POA: Diagnosis not present

## 2018-06-19 DIAGNOSIS — Z66 Do not resuscitate: Secondary | ICD-10-CM | POA: Diagnosis not present

## 2018-06-19 LAB — MAGNESIUM: MAGNESIUM: 2.1 mg/dL (ref 1.7–2.4)

## 2018-06-19 LAB — COMPREHENSIVE METABOLIC PANEL
ALT: 99 U/L — ABNORMAL HIGH (ref 0–44)
ANION GAP: 11 (ref 5–15)
AST: 245 U/L — ABNORMAL HIGH (ref 15–41)
Albumin: 2.6 g/dL — ABNORMAL LOW (ref 3.5–5.0)
Alkaline Phosphatase: 335 U/L — ABNORMAL HIGH (ref 38–126)
BUN: 13 mg/dL (ref 8–23)
CHLORIDE: 100 mmol/L (ref 98–111)
CO2: 24 mmol/L (ref 22–32)
Calcium: 7.5 mg/dL — ABNORMAL LOW (ref 8.9–10.3)
Creatinine, Ser: 0.33 mg/dL — ABNORMAL LOW (ref 0.44–1.00)
Glucose, Bld: 97 mg/dL (ref 70–99)
POTASSIUM: 3.4 mmol/L — AB (ref 3.5–5.1)
Sodium: 135 mmol/L (ref 135–145)
Total Bilirubin: 1.5 mg/dL — ABNORMAL HIGH (ref 0.3–1.2)
Total Protein: 5.3 g/dL — ABNORMAL LOW (ref 6.5–8.1)

## 2018-06-19 LAB — CBC
HCT: 38 % (ref 36.0–46.0)
Hemoglobin: 11.8 g/dL — ABNORMAL LOW (ref 12.0–15.0)
MCH: 27.4 pg (ref 26.0–34.0)
MCHC: 31.1 g/dL (ref 30.0–36.0)
MCV: 88.2 fL (ref 80.0–100.0)
NRBC: 0.5 % — AB (ref 0.0–0.2)
PLATELETS: 135 10*3/uL — AB (ref 150–400)
RBC: 4.31 MIL/uL (ref 3.87–5.11)
RDW: 17.9 % — AB (ref 11.5–15.5)
WBC: 8.6 10*3/uL (ref 4.0–10.5)

## 2018-06-19 LAB — LIPASE, BLOOD: Lipase: 322 U/L — ABNORMAL HIGH (ref 11–51)

## 2018-06-19 LAB — LACTIC ACID, PLASMA: Lactic Acid, Venous: 1.5 mmol/L (ref 0.5–1.9)

## 2018-06-19 MED ORDER — POTASSIUM CHLORIDE CRYS ER 20 MEQ PO TBCR
40.0000 meq | EXTENDED_RELEASE_TABLET | Freq: Once | ORAL | Status: AC
  Administered 2018-06-19: 40 meq via ORAL
  Filled 2018-06-19: qty 2

## 2018-06-19 MED ORDER — SODIUM CHLORIDE 0.9 % IV SOLN
INTRAVENOUS | Status: DC
  Administered 2018-06-19 – 2018-06-20 (×3): via INTRAVENOUS

## 2018-06-19 NOTE — Progress Notes (Signed)
HEMATOLOGY/ONCOLOGY INPATIENT PROGRESS NOTE  Date of Service: 06/19/2018  Inpatient Attending: .Shelly Coss, MD   SUBJECTIVE:   Haley [name] is accompanied today by both her sons at bedside. The pt reports that she is feeling somewhat SOB and notes abdominal discomfort primarily in the upper abdomen   The pt presented to the ED on 06/18/18 with worsening SOB and abdominal pain that had been developing over the previous days. The pt was evaluated with a CXR and CT A/P which respectively revealed abnormal opacity of the right lung base, and moderate to markedly progressive hepatic metastatic disease. The pt was found to have elevated lactic acid and was subsequently started on IV fluids, empiric antibiotics, and a clear liquid diet.   Lab results today (06/19/18) of CBC and CMP is as follows: all values are WNL except for HGB at 11.8, RDW at 17.9, PLT at 135k, nRBC at 0.5%, Potassium at 3.4, Creatinine at 0.33, Calcium at 7.5, Total Protein at 5.3, Albumin at 2.6, AST at 245, ALT at 99, Alk Phos at 335, Total Bilirubin at 1.5.  I discussed all the lab results and imaging studies and concerns regarding abnormal LFts due to metastatic liver disease. Discussed that in the setting of poor functional status, po intake and symptom burden further palliative chemotherapy would be challenging.  Discussed consideration of best supportive cares vs consideration of palliative topotecan in a 3rd line setting.   OBJECTIVE:  NAD  PHYSICAL EXAMINATION: . Vitals:   06/18/18 1617 06/18/18 1810 06/18/18 2134 06/19/18 0511  BP: 134/88 (!) 150/94 (!) 163/86 (!) 141/78  Pulse: 62 76 66 62  Resp: _0 Temp:  98 F (36.7 C) 98.5 F (36.9 C) 98 F (36.7 C)  TempSrc:   Oral Oral  SpO2: 92% 94% 93% 92%  Weight:      Height:       Filed Weights   06/18/18 1248  Weight: 111 lb (50.3 kg)   .Body mass index is 19.66 kg/m.  GENERAL:alert, in no acute distress and comfortable SKIN: skin  color, texture, turgor are normal, no rashes or significant lesions EYES: normal, conjunctiva are pink and non-injected, sclera clear OROPHARYNX:no exudate, no erythema and lips, buccal mucosa, and tongue normal  NECK: supple, no JVD, thyroid normal size, non-tender, without nodularity LYMPH:  no palpable lymphadenopathy in the cervical, axillary or inguinal LUNGS: clear to auscultation with normal respiratory effort HEART: regular rate & rhythm,  no murmurs and no lower extremity edema ABDOMEN: abdomen soft, TTP in the mid and upper abd, no guarding rigidity ebound. Musculoskeletal: no cyanosis of digits and no clubbing  PSYCH: alert & oriented x 3 with fluent speech NEURO: no focal motor/sensory deficits  MEDICAL HISTORY:  Past Medical History:  Diagnosis Date  . Allergy   . Back pain   . Osteopenia   . Small cell lung cancer ([place]) 10/17/2017   Extended stage with metastases to liver and bone  . Tubular adenoma of colon 10/26/2014    SURGICAL HISTORY: Past Surgical History:  Procedure Laterality Date  . BILIARY STENT PLACEMENT N/A 04/14/2018   Procedure: BILIARY STENT PLACEMENT;  Surgeon: [name], MD;  Location: [facility];  Service: Gastroenterology;  Laterality: N/A;  . COLONOSCOPY    . COLONOSCOPY WITH PROPOFOL N/A 10/25/2014   Procedure: COLONOSCOPY WITH PROPOFOL;  Surgeon: Garlan Fair, MD;  Location: [facility];  Service: Endoscopy;  Laterality: N/A;  . ERCP N/A 10/15/2017   Procedure: ENDOSCOPIC RETROGRADE CHOLANGIOPANCREATOGRAPHY (  ERCP);  Surgeon: Clarene Essex, MD;  Location: [facility];  Service: Endoscopy;  Laterality: N/A;  . ERCP N/A 04/14/2018   Procedure: ENDOSCOPIC RETROGRADE CHOLANGIOPANCREATOGRAPHY (ERCP);  Surgeon: [name], MD;  Location: [facility];  Service: Gastroenterology;  Laterality: N/A;  with stent change and Balloon Sweep  . IR FLUORO GUIDE PORT INSERTION RIGHT  11/10/2017  . IR RADIOLOGIST EVAL & MGMT  10/02/2017  . IR US GUIDE VASC  ACCESS RIGHT  11/10/2017  . PORT-A-CATH REMOVAL N/A 04/16/2018   Procedure: REMOVAL PORT-A-CATH;  Surgeon: Clovis Riley, MD;  Location: [facility];  Service: General;  Laterality: N/A;    SOCIAL HISTORY: Social History   Socioeconomic History  . Marital status: Single    Spouse name: Not on file  . Number of children: Not on file  . Years of education: Not on file  . Highest education level: Not on file  Occupational History  . Not on file  Social Needs  . Financial resource strain: Not on file  . Food insecurity:    Worry: Not on file    Inability: Not on file  . Transportation needs:    Medical: No    Non-medical: No  Tobacco Use  . Smoking status: Former Smoker    Packs/day: 1.00    Years: 40.00    Pack years: 40.00    Types: Cigarettes    Last attempt to quit: 09/24/2017    Years since quitting: 0.7  . Smokeless tobacco: Never Used  Substance and Sexual Activity  . Alcohol use: Never    Frequency: Never  . Drug use: No  . Sexual activity: Not Currently  Lifestyle  . Physical activity:    Days per week: Not on file    Minutes per session: Not on file  . Stress: Not on file  Relationships  . Social connections:    Talks on phone: Not on file    Gets together: Not on file    Attends religious service: Not on file    Active member of club or organization: Not on file    Attends meetings of clubs or organizations: Not on file    Relationship status: Not on file  . Intimate partner violence:    Fear of current or ex partner: No    Emotionally abused: No    Physically abused: No    Forced sexual activity: No  Other Topics Concern  . Not on file  Social History Narrative  . Not on file    FAMILY HISTORY: Family History  Problem Relation Age of Onset  . Diabetes Mother   . Hypertension Mother   . Diabetes Sister   . Hypertension Sister   . Hyperlipidemia Sister   . Adrenal disorder Neg Hx     ALLERGIES:  has No Known Allergies.  MEDICATIONS:    Scheduled Meds: . enoxaparin (LOVENOX) injection  40 mg Subcutaneous Q24H  . potassium chloride  40 mEq Oral Once   Continuous Infusions: . sodium chloride 10 mL/hr at 06/19/18 0454  . sodium chloride 100 mL/hr at 06/19/18 1133  . ceFEPime (MAXIPIME) IV 1 g (06/19/18 0942)  . vancomycin     PRN Meds:.sodium chloride, oxyCODONE  REVIEW OF SYSTEMS:    10 Point review of Systems was done is negative except as noted above.   LABORATORY DATA:  I have reviewed the data as listed  . CBC Latest Ref Rng & Units 06/19/2018 06/18/2018 06/18/2018  WBC 4.0 - 10.5 K/uL 8.6 -  9.6  Hemoglobin 12.0 - 15.0 g/dL 11.8(L) 13.6 13.2  Hematocrit 36.0 - 46.0 % 38.0 40.0 42.8  Platelets 150 - 400 K/uL 135(L) - 174    . CMP Latest Ref Rng & Units 06/19/2018 06/18/2018 06/18/2018  Glucose 70 - 99 mg/dL 97 126(H) 131(H)  BUN 8 - 23 mg/dL 13 26(H) 27(H)  Creatinine 0.44 - 1.00 mg/dL 0.33(L) 0.60 0.66  Sodium 135 - 145 mmol/L 135 141 143  Potassium 3.5 - 5.1 mmol/L 3.4(L) 2.9(L) 3.0(L)  Chloride 98 - 111 mmol/L 100 101 103  CO2 22 - 32 mmol/L 24 - 29  Calcium 8.9 - 10.3 mg/dL 7.5(L) - 8.3(L)  Total Protein 6.5 - 8.1 g/dL 5.3(L) - 6.3(L)  Total Bilirubin 0.3 - 1.2 mg/dL 1.5(H) - 1.8(H)  Alkaline Phos 38 - 126 U/L 335(H) - 400(H)  AST 15 - 41 U/L 245(H) - 251(H)  ALT 0 - 44 U/L 99(H) - 105(H)     RADIOGRAPHIC STUDIES: I have personally reviewed the radiological images as listed and agreed with the findings in the report. Dg Chest 2 View  Result Date: 06/18/2018 CLINICAL DATA:  Short of breath, nausea, abdominal pain, history of lung carcinoma EXAM: CHEST - 2 VIEW COMPARISON:  CT abdomen pelvis of 05/02/2018 and chest x-ray of 05/13/2017 FINDINGS: There is opacity at the right lung base which may represent atelectasis and/or pneumonia. The left lung is clear. Mediastinal and hilar contours are unremarkable. The heart is mildly enlarged. No bony abnormality is seen. IMPRESSION: Abnormal opacity at  the right lung base may be due to atelectasis and possibly pneumonia in this patient with known right infrahilar lesion. Electronically Signed   By: Ivar Drape M.D.   On: 06/18/2018 14:07   Ct Angio Chest Pe W And/or Wo Contrast  Result Date: 06/18/2018 CLINICAL DATA:  Short of breath, abdominal pain, history of lung carcinoma EXAM: CT ANGIOGRAPHY CHEST WITH CONTRAST TECHNIQUE: Multidetector CT imaging of the chest was performed using the standard protocol during bolus administration of intravenous contrast. Multiplanar CT image reconstructions and MIPs were obtained to evaluate the vascular anatomy. CONTRAST:  169m ISOVUE-370 IOPAMIDOL (ISOVUE-370) INJECTION 76% COMPARISON:  Chest x-ray from today and CT chest of 05/02/2017 FINDINGS: Cardiovascular: There is good opacification of the pulmonary arteries. There is no evidence of pulmonary embolism. The right pulmonary artery does narrow as a courses through the right infrahilar opacity possibly a mass or adenopathy. Also the pulmonary artery branches to the right lower lobe do opacify into the atelectatic right lower lobe with no filling defect noted. The heart is moderately enlarged. No pericardial effusion is seen. Coronary artery calcifications are present primarily in the distribution of the left anterior descending coronary artery. The midthoracic aorta measures 32 mm in diameter. Moderate thoracic aortic atherosclerosis is present. Mediastinum/Nodes: Only small mediastinal lymph nodes are present. There is abnormal soft tissue of the subcarinal region toward extending to the right infrahilar region which may represent infiltration of mass. The thyroid gland is unremarkable. No hiatal hernia is seen. Lungs/Pleura: On lung window images, there are diffuse emphysematous changes present. No suspicious lung nodule is seen. However there is partial collapse of the right lower lobe which appears to be due to occlusion of the bronchus to the right lower lobe  presumably due to right infrahilar mass. No definite pleural effusion is seen. Upper Abdomen: On delayed images, there are multiple liver lesions of varying sizes consistent with diffuse liver metastases. The majority of these larger lesions are present  near the porta hepatis. Musculoskeletal: There are diffuse sclerotic bone metastases is noted. Sclerotic foci are present throughout multiple thoracic vertebral bodies consistent with diffuse metastases. No compression deformity is seen. Review of the MIP images confirms the above findings. IMPRESSION: 1. No evidence of acute pulmonary embolism. 2. Collapse of the right lower lobe probably due to infrahilar infiltrative mass and atelectasis. 3. Diffuse liver metastases. 4. Multiple sclerotic bone lesions consistent with diffuse skeletal metastases. 5. Coronary artery calcifications. Electronically Signed   By: Ivar Drape M.D.   On: 06/18/2018 15:37   Ct Abdomen Pelvis W Contrast  Result Date: 06/18/2018 CLINICAL DATA:  Shortness of breath EXAM: CT ABDOMEN AND PELVIS WITH CONTRAST TECHNIQUE: Multidetector CT imaging of the abdomen and pelvis was performed using the standard protocol following bolus administration of intravenous contrast. CONTRAST:  125m ISOVUE-370 IOPAMIDOL (ISOVUE-370) INJECTION 76% COMPARISON:  CT scan 04/12/2018 FINDINGS: Lower chest: Persistent right lower lobe collapse. No definite pulmonary nodules. No pleural effusions. Stable emphysematous changes. Hepatobiliary: Significant progression of hepatic metastatic disease, suggesting very aggressive disease. Right hepatic lobe lesion on image 28 of series 9 measures 6.0 x 4.5 cm and previously measured 2.3 x 2.2 cm. Segment 7 lesion on image number 15 of series 9 measures 3.7 x 3.6 cm and previously measured a maximum 19 mm. Caudate lobe lesion measures 4.3 x 4.1 cm and previously measured a maximum of 9 mm. Segment 3 lesion on image number 36 measures 5.5 x 3.1 cm and previously measured a  maximum of 16 mm. Innumerable new hepatic metastatic lesions. Biliary stent is in place. The gallbladder is grossly normal. The portal vein is patent. Pancreas: No mass, inflammation.  Mild stable ductal dilatation. Spleen: Normal size.  No focal lesions. 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 The right adrenal gland is normal. Both kidneys are unremarkable and stable. The bladder is unremarkable. Stomach/Bowel: The stomach, duodenum, small bowel and colon are grossly normal. No acute inflammatory changes, mass lesions or obstructive findings. Vascular/Lymphatic: Advanced atherosclerotic calcifications involving the aorta and branch vessel ostia. No aneurysm or dissection. The major venous structures are patent. Small scattered mesenteric and retroperitoneal lymph nodes. Left para-aortic node on image number 33 measures 16.5 x 12 mm and was not present on the prior study. Progressive periportal lymphadenopathy. Reproductive: The uterus and ovaries are unremarkable. 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 Other: Small to moderate free pelvic fluid. No pelvic mass or adenopathy. No inguinal adenopathy. Sebaceous cyst noted in the right pubic area. Musculoskeletal: Overall stable diffuse lytic and sclerotic metastatic bone disease. Stable appearing pathologic fracture of L2 with retropulsion and mild canal compromise. No new pathologic fractures are demonstrated. IMPRESSION: 1. Markedly progressive hepatic metastatic disease. 2. Enlarging left adrenal gland metastasis. 3. Progressive periportal and retroperitoneal adenopathy. 4. Stable appearing diffuse lytic and sclerotic metastatic bone disease with a stable appearing pathologic fracture of L2. No progressive spinal canal compromise. Electronically Signed   By: PMarijo SanesM.D.   On: 06/18/2018 15:46    ASSESSMENT & PLAN:  67y.o. female with   1.  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  Liver mets + Bone mets + adrenal mets Pancreatic mets- FNA confirmed MRI brain 10/29/2017 after this clinic visit showed concern for multiple asymptomatic subcentimeter brain mets. 11/28/17 Brain MRI  which revealed stable lesions, and new nonenhancing lesions.  12/26/17 PET which revealed Marked improvement in the previously demonstrated widespread hypermetabolic metastatic disease. There is only mild residual hypermetabolic activity within some of the osseous lesions.   patient has completed 4 cycles of carboplatin/etoposide/Atezolizumab and completed -completed WBRT 30 Gy in 10 fractions 01/15/2018 - 01/29/2018  Was on second line treatment with Pembrolizumab  04/12/18 CT A/P revealed concern for progression of mets in liver  2. Poor po intake  3. Adrenal insufficiency  4. Abnormal LFts likely due to metastatic disease. R/o biliary obstruction.  5. RLL lung collapse due to small cell lung cancer  PLAN: -discussed labs results and CT results showing overt progression and complication related to this. -I discussed all the lab results and imaging studies and concerns regarding abnormal LFts due to metastatic liver disease. -Discussed that in the setting of poor functional status, po intake and symptom burden further palliative chemotherapy would be challenging and would likely result in recurrent hospitalizations. -Discussed consideration of best supportive cares through hospice vs consideration of palliative topotecan in a 3rd line setting.  -patient and son had a lot of questions which were answered in details. -Continue Hydrocortisone 20m and 134meach day with increase to stress dose steroids if needed. -consider palliative care consultation for further goals of care discussions -will f/u on  Monday if still in the hospital  The total time spent in the appt was 60 minutes and more than 50% was on counseling and direct patient cares.    GaSullivan LoneD MS AAHIVMS SCSsm Health Endoscopy CenterTUniversity Surgery Centerematology/Oncology Physician CoMaricopa Medical Center(Office):       [phone] cell):  [phone]):           [phone]/25/2019 12:58 PM   I, ScBaldwin Jamaicaam acting as a scribe for Dr. KaIrene Limbo.I have reviewed the above documentation for accuracy and completeness, and I agree with the above. GaSullivan LoneD MS

## 2018-06-19 NOTE — Progress Notes (Signed)
PT Cancellation Note  Patient Details Name: Haley [name] MRN: [id number] DOB: 02/19/1951   Cancelled Treatment:    Reason Eval/Treat Not Completed: Attempted PT eval-pt declined participation at this time. Will check back another day/time.    Weston Anna, PT Acute Rehabilitation Services Pager: [phone] Office: [phone]

## 2018-06-19 NOTE — Progress Notes (Addendum)
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  Imagings done on presentation showed progressive metastatic disease and also possible pneumonia on right lung base.  Admitted for further management and evaluation.  Assessment & Plan:   Principal Problem:   Metastatic lung cancer (metastasis from lung to other site) [facility]) Active Problems:   Pancreatitis, recurrent   Hypokalemia   Pancreatitis   Metastatic small cell lung cancer: Apparently has progressed as per the imagings done on admission here.Showed markedly progressive hepatic metastatic disease,enlarging left adrenal gland metastasis,progressive periportal and retroperitoneal adenopathy,stable appearing diffuse lytic and sclerotic metastatic bone disease with a stable appearing pathologic fracture of L2.  She follows with Dr.Kale who will see her here. Given her progression of metastatic disease, I have discussed with her today regarding palliative care consultation and she agrees with that. She was on chemo and radiation therapy. She has elevated lipase, alkaline phosphatase and increased liver enzymes most likely this is all from the metastatic disease to liver.  Possible pneumonia:CT showed collapse of the right lower lobe probably due to infrahilar infiltrative mass and atelectasis.  She was started on broad-spectrum antibiotics on presentation as she presented with lactic acidosis.  We will continue the antibiotics for today and consider discontinuing tomorrow.  Blood cultures have been sent.  Lactic acidosis: Could be from pneumonia or dehydration.  Continue IV fluids.  Will check a follow-up lactate level today.  Suspected  pancreatitis: Complained of abdominal pain.  Lipase elevated to 322.  CT imaging did not show any inflammation of pancreas.  Most likely this is all from her metastatic disease.  Continue pain management.  Hypokalemia: Supplemented.    DVT prophylaxis: Lovenox Code Status: Full Family Communication: None present at the bedside Disposition Plan: Likely home after resolution of abdominal pain, discussion with palliative care, further work-up   Consultants: Oncology, palliative care  Procedures: None  Antimicrobials: Vancomycin and cefepime  Subjective:  Patient seen and examined the bedside this morning.  Remains significantly weak.  Still complains of abdominal pain.  Also complains of shortness of breath. Objective: Vitals:   06/18/18 1617 06/18/18 1810 06/18/18 2134 06/19/18 0511  BP: 134/88 (!) 150/94 (!) 163/86 (!) 141/78  Pulse: 62 76 66 62  Resp: 19  20 20   Temp:  98 F (36.7 C) 98.5 F (36.9 C) 98 F (36.7 C)  TempSrc:   Oral Oral  SpO2: 92% 94% 93% 92%  Weight:      Height:        Intake/Output Summary (Last 24 hours) at 06/19/2018 1134 Last data filed at 06/19/2018 0454 Gross per 24 hour  Intake 2462.72 ml  Output 1 ml  Net 2461.72 ml   Filed Weights   06/18/18 1248  Weight: 50.3 kg    Examination:  General exam: Not in distress,weak,cachetic, chronically ill looking HEENT:PERRL,Oral mucosa moist, Ear/Nose normal on gross exam Respiratory system: Bilateral air entry, rhonchi Cardiovascular system: S1 & S2 heard, RRR. No JVD, murmurs, rubs, gallops or clicks. No pedal edema. Gastrointestinal system: Abdomen is mildly  distended, soft and with mild generalized tenderness. No organomegaly or masses felt. Normal bowel sounds heard. Central nervous system: Alert and oriented. No focal neurological deficits. Extremities: No edema, no  clubbing ,no cyanosis, distal peripheral pulses palpable. Skin: No rashes, lesions or ulcers,no icterus ,no  pallor    Data Reviewed: I have personally reviewed following labs and imaging studies  CBC: Recent Labs  Lab 06/18/18 1414 06/18/18 1421 06/19/18 0502  WBC 9.6  --  8.6  NEUTROABS 7.5  --   --   HGB 13.2 13.6 11.8*  HCT 42.8 40.0 38.0  MCV 89.4  --  88.2  PLT 174  --  448*   Basic Metabolic Panel: Recent Labs  Lab 06/18/18 1414 06/18/18 1421 06/19/18 0502  NA 143 141 135  K 3.0* 2.9* 3.4*  CL 103 101 100  CO2 29  --  24  GLUCOSE 131* 126* 97  BUN 27* 26* 13  CREATININE 0.66 0.60 0.33*  CALCIUM 8.3*  --  7.5*  MG  --   --  2.1   GFR: Estimated Creatinine Clearance: 55.7 mL/min (A) (by C-G formula based on SCr of 0.33 mg/dL (L)). Liver Function Tests: Recent Labs  Lab 06/18/18 1414 06/19/18 0502  AST 251* 245*  ALT 105* 99*  ALKPHOS 400* 335*  BILITOT 1.8* 1.5*  PROT 6.3* 5.3*  ALBUMIN 2.9* 2.6*   Recent Labs  Lab 06/18/18 1414 06/19/18 0502  LIPASE 80* 322*   No results for input(s): AMMONIA in the last 168 hours. Coagulation Profile: No results for input(s): INR, PROTIME in the last 168 hours. Cardiac Enzymes: No results for input(s): CKTOTAL, CKMB, CKMBINDEX, TROPONINI in the last 168 hours. BNP (last 3 results) No results for input(s): PROBNP in the last 8760 hours. HbA1C: No results for input(s): HGBA1C in the last 72 hours. CBG: No results for input(s): GLUCAP in the last 168 hours. Lipid Profile: No results for input(s): CHOL, HDL, LDLCALC, TRIG, CHOLHDL, LDLDIRECT in the last 72 hours. Thyroid Function Tests: No results for input(s): TSH, T4TOTAL, FREET4, T3FREE, THYROIDAB in the last 72 hours. Anemia Panel: No results for input(s): VITAMINB12, FOLATE, FERRITIN, TIBC, IRON, RETICCTPCT in the last 72 hours. Sepsis Labs: Recent Labs  Lab 06/18/18 1422 06/18/18 1700  LATICACIDVEN 2.82* 3.21*    No results found for this or any previous visit (from the past 240 hour(s)).       Radiology Studies: Dg Chest 2 View  Result Date:  06/18/2018 CLINICAL DATA:  Short of breath, nausea, abdominal pain, history of lung carcinoma EXAM: CHEST - 2 VIEW COMPARISON:  CT abdomen pelvis of 05/02/2018 and chest x-ray of 05/13/2017 FINDINGS: There is opacity at the right lung base which may represent atelectasis and/or pneumonia. The left lung is clear. Mediastinal and hilar contours are unremarkable. The heart is mildly enlarged. No bony abnormality is seen. IMPRESSION: Abnormal opacity at the right lung base may be due to atelectasis and possibly pneumonia in this patient with known right infrahilar lesion. Electronically Signed   By: Ivar Drape M.D.   On: 06/18/2018 14:07   Ct Angio Chest Pe W And/or Wo Contrast  Result Date: 06/18/2018 CLINICAL DATA:  Short of breath, abdominal pain, history of lung carcinoma EXAM: CT ANGIOGRAPHY CHEST WITH CONTRAST TECHNIQUE: Multidetector CT imaging of the chest was performed using the standard protocol during bolus administration of intravenous contrast. Multiplanar CT image reconstructions and MIPs were obtained to evaluate the vascular anatomy. CONTRAST:  18mL ISOVUE-370 IOPAMIDOL (ISOVUE-370) INJECTION 76% COMPARISON:  Chest x-ray from today and CT chest of 05/02/2017 FINDINGS: Cardiovascular: There is good opacification of the pulmonary arteries. There is no evidence of pulmonary embolism. The right  pulmonary artery does narrow as a courses through the right infrahilar opacity possibly a mass or adenopathy. Also the pulmonary artery branches to the right lower lobe do opacify into the atelectatic right lower lobe with no filling defect noted. The heart is moderately enlarged. No pericardial effusion is seen. Coronary artery calcifications are present primarily in the distribution of the left anterior descending coronary artery. The midthoracic aorta measures 32 mm in diameter. Moderate thoracic aortic atherosclerosis is present. Mediastinum/Nodes: Only small mediastinal lymph nodes are present. There is  abnormal soft tissue of the subcarinal region toward extending to the right infrahilar region which may represent infiltration of mass. The thyroid gland is unremarkable. No hiatal hernia is seen. Lungs/Pleura: On lung window images, there are diffuse emphysematous changes present. No suspicious lung nodule is seen. However there is partial collapse of the right lower lobe which appears to be due to occlusion of the bronchus to the right lower lobe presumably due to right infrahilar mass. No definite pleural effusion is seen. Upper Abdomen: On delayed images, there are multiple liver lesions of varying sizes consistent with diffuse liver metastases. The majority of these larger lesions are present near the porta hepatis. Musculoskeletal: There are diffuse sclerotic bone metastases is noted. Sclerotic foci are present throughout multiple thoracic vertebral bodies consistent with diffuse metastases. No compression deformity is seen. Review of the MIP images confirms the above findings. IMPRESSION: 1. No evidence of acute pulmonary embolism. 2. Collapse of the right lower lobe probably due to infrahilar infiltrative mass and atelectasis. 3. Diffuse liver metastases. 4. Multiple sclerotic bone lesions consistent with diffuse skeletal metastases. 5. Coronary artery calcifications. Electronically Signed   By: Ivar Drape M.D.   On: 06/18/2018 15:37   Ct Abdomen Pelvis W Contrast  Result Date: 06/18/2018 CLINICAL DATA:  Shortness of breath EXAM: CT ABDOMEN AND PELVIS WITH CONTRAST TECHNIQUE: Multidetector CT imaging of the abdomen and pelvis was performed using the standard protocol following bolus administration of intravenous contrast. CONTRAST:  110mL ISOVUE-370 IOPAMIDOL (ISOVUE-370) INJECTION 76% COMPARISON:  CT scan 04/12/2018 FINDINGS: Lower chest: Persistent right lower lobe collapse. No definite pulmonary nodules. No pleural effusions. Stable emphysematous changes. Hepatobiliary: Significant progression of  hepatic metastatic disease, suggesting very aggressive disease. Right hepatic lobe lesion on image 28 of series 9 measures 6.0 x 4.5 cm and previously measured 2.3 x 2.2 cm. Segment 7 lesion on image number 15 of series 9 measures 3.7 x 3.6 cm and previously measured a maximum 19 mm. Caudate lobe lesion measures 4.3 x 4.1 cm and previously measured a maximum of 9 mm. Segment 3 lesion on image number 36 measures 5.5 x 3.1 cm and previously measured a maximum of 16 mm. Innumerable new hepatic metastatic lesions. Biliary stent is in place. The gallbladder is grossly normal. The portal vein is patent. Pancreas: No mass, inflammation.  Mild stable ductal dilatation. Spleen: Normal size.  No focal lesions. 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 The right adrenal gland is normal. Both kidneys are unremarkable and stable. The bladder is unremarkable. Stomach/Bowel: The stomach, duodenum, small bowel and colon are grossly normal. No acute inflammatory changes, mass lesions or obstructive findings. Vascular/Lymphatic: Advanced atherosclerotic calcifications involving the aorta and branch vessel ostia. No aneurysm or dissection. The major venous structures are patent. Small scattered mesenteric and retroperitoneal lymph nodes. Left para-aortic node on image number 33 measures 16.5 x 12 mm and was not present on  the prior study. Progressive periportal lymphadenopathy. Reproductive: The uterus and ovaries are unremarkable. 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 Other: Small to moderate free pelvic fluid. No pelvic mass or adenopathy. No inguinal adenopathy. Sebaceous cyst noted in the right pubic area. Musculoskeletal: Overall stable diffuse lytic and sclerotic metastatic bone disease. Stable appearing pathologic fracture of L2 with retropulsion and mild canal compromise. No new pathologic fractures are demonstrated.  IMPRESSION: 1. Markedly progressive hepatic metastatic disease. 2. Enlarging left adrenal gland metastasis. 3. Progressive periportal and retroperitoneal adenopathy. 4. Stable appearing diffuse lytic and sclerotic metastatic bone disease with a stable appearing pathologic fracture of L2. No progressive spinal canal compromise. Electronically Signed   By: Marijo Sanes M.D.   On: 06/18/2018 15:46        Scheduled Meds: . enoxaparin (LOVENOX) injection  40 mg Subcutaneous Q24H  . potassium chloride  40 mEq Oral Once   Continuous Infusions: . sodium chloride 10 mL/hr at 06/19/18 0454  . sodium chloride 100 mL/hr at 06/19/18 1133  . ceFEPime (MAXIPIME) IV 1 g (06/19/18 0942)  . vancomycin       LOS: 0 days    Time spent: 35 mins.More than 50% of that time was spent in counseling and/or coordination of care.      Shelly Coss, MD [facility] Pager [phone]  If 7PM-7AM, please contact night-coverage [email or web address] Password [facility] 06/19/2018, 11:34 AM

## 2018-06-19 NOTE — Progress Notes (Signed)
Palliative Medicine consult noted. Due to high referral volume, there may be a delay seeing this patient. Please call the Palliative Medicine Team office at [phone] if recommendations are needed in the interim.  Thank you for inviting [place] to see this patient.  Marjie Skiff Kennis Buell, RN, BSN, [facility] Palliative Medicine Team 06/19/2018 3:30 PM Office [phone]

## 2018-06-20 DIAGNOSIS — J9811 Atelectasis: Secondary | ICD-10-CM

## 2018-06-20 DIAGNOSIS — Z7189 Other specified counseling: Secondary | ICD-10-CM

## 2018-06-20 DIAGNOSIS — Z515 Encounter for palliative care: Secondary | ICD-10-CM

## 2018-06-20 DIAGNOSIS — G893 Neoplasm related pain (acute) (chronic): Principal | ICD-10-CM

## 2018-06-20 LAB — CBC WITH DIFFERENTIAL/PLATELET
Abs Immature Granulocytes: 0.19 10*3/uL — ABNORMAL HIGH (ref 0.00–0.07)
BASOS ABS: 0 10*3/uL (ref 0.0–0.1)
Basophils Relative: 0 %
EOS ABS: 0 10*3/uL (ref 0.0–0.5)
Eosinophils Relative: 0 %
HEMATOCRIT: 37.1 % (ref 36.0–46.0)
Hemoglobin: 11.5 g/dL — ABNORMAL LOW (ref 12.0–15.0)
Immature Granulocytes: 2 %
LYMPHS ABS: 1 10*3/uL (ref 0.7–4.0)
Lymphocytes Relative: 12 %
MCH: 27.5 pg (ref 26.0–34.0)
MCHC: 31 g/dL (ref 30.0–36.0)
MCV: 88.8 fL (ref 80.0–100.0)
MONO ABS: 0.7 10*3/uL (ref 0.1–1.0)
Monocytes Relative: 8 %
NRBC: 0.8 % — AB (ref 0.0–0.2)
Neutro Abs: 6.7 10*3/uL (ref 1.7–7.7)
Neutrophils Relative %: 78 %
Platelets: 120 10*3/uL — ABNORMAL LOW (ref 150–400)
RBC: 4.18 MIL/uL (ref 3.87–5.11)
RDW: 18.3 % — AB (ref 11.5–15.5)
WBC: 8.6 10*3/uL (ref 4.0–10.5)

## 2018-06-20 LAB — COMPREHENSIVE METABOLIC PANEL
ALBUMIN: 2.6 g/dL — AB (ref 3.5–5.0)
ALK PHOS: 365 U/L — AB (ref 38–126)
ALT: 104 U/L — ABNORMAL HIGH (ref 0–44)
ANION GAP: 9 (ref 5–15)
AST: 290 U/L — ABNORMAL HIGH (ref 15–41)
BUN: 10 mg/dL (ref 8–23)
CALCIUM: 6.8 mg/dL — AB (ref 8.9–10.3)
CO2: 22 mmol/L (ref 22–32)
CREATININE: 0.37 mg/dL — AB (ref 0.44–1.00)
Chloride: 101 mmol/L (ref 98–111)
GFR calc Af Amer: >60 mL/min (ref >60)
Glucose, Bld: 78 mg/dL (ref 70–99)
Potassium: 3.4 mmol/L — ABNORMAL LOW (ref 3.5–5.1)
Sodium: 132 mmol/L — ABNORMAL LOW (ref 135–145)
TOTAL PROTEIN: 5.1 g/dL — AB (ref 6.5–8.1)
Total Bilirubin: 2.6 mg/dL — ABNORMAL HIGH (ref 0.3–1.2)

## 2018-06-20 LAB — LIPASE, BLOOD: LIPASE: 259 U/L — AB (ref 11–51)

## 2018-06-20 MED ORDER — OXYCODONE HCL 5 MG PO TABS
10.0000 mg | ORAL_TABLET | Freq: Four times a day (QID) | ORAL | Status: DC
  Administered 2018-06-20 – 2018-06-21 (×4): 10 mg via ORAL
  Filled 2018-06-20 (×4): qty 2

## 2018-06-20 MED ORDER — HYDROMORPHONE HCL 1 MG/ML IJ SOLN
1.0000 mg | INTRAMUSCULAR | Status: DC | PRN
  Administered 2018-06-20 (×2): 1 mg via INTRAVENOUS
  Filled 2018-06-20 (×2): qty 1

## 2018-06-20 MED ORDER — POLYETHYLENE GLYCOL 3350 17 G PO PACK
17.0000 g | PACK | Freq: Every day | ORAL | Status: DC
  Administered 2018-06-20 – 2018-06-21 (×2): 17 g via ORAL
  Filled 2018-06-20 (×2): qty 1

## 2018-06-20 MED ORDER — SODIUM CHLORIDE 0.9 % IV SOLN
INTRAVENOUS | Status: DC
  Administered 2018-06-20: 23:00:00 via INTRAVENOUS

## 2018-06-20 MED ORDER — POTASSIUM CHLORIDE CRYS ER 20 MEQ PO TBCR
40.0000 meq | EXTENDED_RELEASE_TABLET | Freq: Once | ORAL | Status: AC
  Administered 2018-06-20: 40 meq via ORAL
  Filled 2018-06-20: qty 2

## 2018-06-20 MED ORDER — MUSCLE RUB 10-15 % EX CREA
TOPICAL_CREAM | Freq: Two times a day (BID) | CUTANEOUS | Status: DC | PRN
  Filled 2018-06-20: qty 85

## 2018-06-20 MED ORDER — MORPHINE SULFATE (PF) 2 MG/ML IV SOLN: 2.0000 mg | INTRAVENOUS | Status: DC | PRN

## 2018-06-20 MED ORDER — SENNA 8.6 MG PO TABS
2.0000 | ORAL_TABLET | Freq: Every day | ORAL | Status: DC
  Administered 2018-06-20: 17.2 mg via ORAL
  Filled 2018-06-20: qty 2

## 2018-06-20 MED ORDER — TROLAMINE SALICYLATE 10 % EX CREA: TOPICAL_CREAM | Freq: Two times a day (BID) | CUTANEOUS | Status: DC | PRN

## 2018-06-20 MED ORDER — SENNA 8.6 MG PO TABS
2.0000 | ORAL_TABLET | Freq: Two times a day (BID) | ORAL | Status: DC
  Administered 2018-06-20 – 2018-06-21 (×2): 17.2 mg via ORAL
  Filled 2018-06-20 (×2): qty 2

## 2018-06-20 MED ORDER — OXYCODONE HCL 5 MG PO TABS
10.0000 mg | ORAL_TABLET | ORAL | Status: DC | PRN
  Administered 2018-06-21: 10 mg via ORAL
  Filled 2018-06-20: qty 2

## 2018-06-20 NOTE — Progress Notes (Signed)
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  Imagings done on presentation showed progressive metastatic disease and also possible pneumonia on right lung base.  Admitted for further management and evaluation.  Oncology and palliative care consulted.  Assessment & Plan:   Principal Problem:   Metastatic lung cancer (metastasis from lung to other site) [facility]) Active Problems:   Pancreatitis, recurrent   Hypokalemia   Pancreatitis   Metastatic small cell lung cancer: Apparently has progressed as per the imagings done on admission here.Showed markedly progressive hepatic metastatic disease,enlarging left adrenal gland metastasis,progressive periportal and retroperitoneal adenopathy,stable appearing diffuse lytic and sclerotic metastatic bone disease with a stable appearing pathologic fracture of L2.  She follows with Dr.Kale who will see her here. Given her progression of metastatic disease, I have discussed with her today regarding palliative care consultation and she agrees with that. She was on chemo and radiation therapy. She has elevated lipase, alkaline phosphatase and increased liver enzymes most likely this is all from the metastatic disease to liver.  Abdominal pain: From progressive metastatic disease to the abdomen.  Continue pain management.  Continue bowel regimen along with pain medications.  Possible pneumonia:CT showed collapse of the right lower lobe probably due to infrahilar infiltrative mass and atelectasis.  She was started on broad-spectrum antibiotics on presentation as she presented with lactic acidosis.  We will discontinue the antibiotics today, I doubt this is pneumonia.   Blood cultures have been sent whick we will follow up.  Lactic acidosis: Could be from pneumonia or dehydration.  Continue IV fluids.  Will check a follow-up lactate level today.  Suspected pancreatitis: Complained of abdominal pain.  Lipase elevated to 322.  CT imaging did not show any inflammation of pancreas.  Most likely this is all from her metastatic disease.  Continue pain management.Continue gentle IV fluids  Hypokalemia: Supplemented.  Weakness/back pain: This is from metastatic bone disease and also from stable pathological fracture at L2.  Continue supportive care.  Physical therapy consultation requested.   DVT prophylaxis: Lovenox Code Status: Full Family Communication: None present at the bedside Disposition Plan: Likely home vs SNF after resolution of abdominal pain, discussion with palliative care, oncology,PT evaluation.Looks like she is a hospice candidate to me.Still significantly weak and complains of abdominal pain   Consultants: Oncology, palliative care  Procedures: None  Antimicrobials: None  Subjective:  Patient seen and examined the bedside this morning.  Remains significantly weak.  Still complains of generalized abdominal pain.  Denies any chest pain or shortness of breath.  Objective: Vitals:   06/19/18 0511 06/19/18 1515 06/19/18 2012 06/20/18 0345  BP: (!) 141/78 113/74 121/60 138/78  Pulse: 62 62 70 82  Resp: 20 16 14 16   Temp: 98 F (36.7 C) 98.1 F (36.7 C) 98.2 F (36.8 C) 98.2 F (36.8 C)  TempSrc: Oral Oral Oral Oral  SpO2: 92% 92% 94% 96%  Weight:    52.4 kg  Height:        Intake/Output Summary (Last 24 hours) at 06/20/2018 1010 Last data filed at 06/20/2018 0500 Gross per 24 hour  Intake 2009.36 ml  Output 1 ml  Net 2008.36 ml   Filed Weights   06/18/18 1248 06/20/18 0345  Weight: 50.3 kg 52.4 kg    Examination:  General exam: Not in distress,weak,cachetic, chronically ill looking HEENT:PERRL,Oral mucosa moist,  Ear/Nose normal on gross exam Respiratory system: Bilateral decreased air entry in the bases Cardiovascular system: S1 & S2 heard, RRR. No JVD, murmurs, rubs, gallops or clicks. No pedal edema. Gastrointestinal system: Abdomen is mildly  distended, soft and with mild generalized tenderness. No organomegaly or masses felt. Normal bowel sounds heard. Central nervous system: Alert and oriented. No focal neurological deficits. Extremities: No edema, no clubbing ,no cyanosis, distal peripheral pulses palpable. Skin: No rashes, lesions or ulcers,no icterus ,no pallor    Data Reviewed: I have personally reviewed following labs and imaging studies  CBC: Recent Labs  Lab 06/18/18 1414 06/18/18 1421 06/19/18 0502 06/20/18 0313  WBC 9.6  --  8.6 8.6  NEUTROABS 7.5  --   --  6.7  HGB 13.2 13.6 11.8* 11.5*  HCT 42.8 40.0 38.0 37.1  MCV 89.4  --  88.2 88.8  PLT 174  --  135* 654*   Basic Metabolic Panel: Recent Labs  Lab 06/18/18 1414 06/18/18 1421 06/19/18 0502 06/20/18 0313  NA 143 141 135 132*  K 3.0* 2.9* 3.4* 3.4*  CL 103 101 100 101  CO2 29  --  24 22  GLUCOSE 131* 126* 97 78  BUN 27* 26* 13 10  CREATININE 0.66 0.60 0.33* 0.37*  CALCIUM 8.3*  --  7.5* 6.8*  MG  --   --  2.1  --    GFR: Estimated Creatinine Clearance: 58 mL/min (A) (by C-G formula based on SCr of 0.37 mg/dL (L)). Liver Function Tests: Recent Labs  Lab 06/18/18 1414 06/19/18 0502 06/20/18 0313  AST 251* 245* 290*  ALT 105* 99* 104*  ALKPHOS 400* 335* 365*  BILITOT 1.8* 1.5* 2.6*  PROT 6.3* 5.3* 5.1*  ALBUMIN 2.9* 2.6* 2.6*   Recent Labs  Lab 06/18/18 1414 06/19/18 0502 06/20/18 0313  LIPASE 80* 322* 259*   No results for input(s): AMMONIA in the last 168 hours. Coagulation Profile: No results for input(s): INR, PROTIME in the last 168 hours. Cardiac Enzymes: No results for input(s): CKTOTAL, CKMB, CKMBINDEX, TROPONINI in the last 168 hours. BNP (last 3 results) No results for input(s):  PROBNP in the last 8760 hours. HbA1C: No results for input(s): HGBA1C in the last 72 hours. CBG: No results for input(s): GLUCAP in the last 168 hours. Lipid Profile: No results for input(s): CHOL, HDL, LDLCALC, TRIG, CHOLHDL, LDLDIRECT in the last 72 hours. Thyroid Function Tests: No results for input(s): TSH, T4TOTAL, FREET4, T3FREE, THYROIDAB in the last 72 hours. Anemia Panel: No results for input(s): VITAMINB12, FOLATE, FERRITIN, TIBC, IRON, RETICCTPCT in the last 72 hours. Sepsis Labs: Recent Labs  Lab 06/18/18 1422 06/18/18 1700 06/19/18 1032  LATICACIDVEN 2.82* 3.21* 1.5    No results found for this or any previous visit (from the past 240 hour(s)).       Radiology Studies: Dg Chest 2 View  Result Date: 06/18/2018 CLINICAL DATA:  Short of breath, nausea, abdominal pain, history of lung carcinoma EXAM: CHEST - 2 VIEW COMPARISON:  CT abdomen pelvis of 05/02/2018 and chest x-ray of 05/13/2017 FINDINGS: There is opacity at the right lung base which may represent atelectasis and/or pneumonia. The left lung is clear. Mediastinal and hilar contours are unremarkable. The heart is mildly enlarged. No bony abnormality is seen. IMPRESSION: Abnormal opacity at the right lung base may be due to atelectasis and possibly pneumonia in  this patient with known right infrahilar lesion. Electronically Signed   By: Ivar Drape M.D.   On: 06/18/2018 14:07   Ct Angio Chest Pe W And/or Wo Contrast  Result Date: 06/18/2018 CLINICAL DATA:  Short of breath, abdominal pain, history of lung carcinoma EXAM: CT ANGIOGRAPHY CHEST WITH CONTRAST TECHNIQUE: Multidetector CT imaging of the chest was performed using the standard protocol during bolus administration of intravenous contrast. Multiplanar CT image reconstructions and MIPs were obtained to evaluate the vascular anatomy. CONTRAST:  179mL ISOVUE-370 IOPAMIDOL (ISOVUE-370) INJECTION 76% COMPARISON:  Chest x-ray from today and CT chest of 05/02/2017  FINDINGS: Cardiovascular: There is good opacification of the pulmonary arteries. There is no evidence of pulmonary embolism. The right pulmonary artery does narrow as a courses through the right infrahilar opacity possibly a mass or adenopathy. Also the pulmonary artery branches to the right lower lobe do opacify into the atelectatic right lower lobe with no filling defect noted. The heart is moderately enlarged. No pericardial effusion is seen. Coronary artery calcifications are present primarily in the distribution of the left anterior descending coronary artery. The midthoracic aorta measures 32 mm in diameter. Moderate thoracic aortic atherosclerosis is present. Mediastinum/Nodes: Only small mediastinal lymph nodes are present. There is abnormal soft tissue of the subcarinal region toward extending to the right infrahilar region which may represent infiltration of mass. The thyroid gland is unremarkable. No hiatal hernia is seen. Lungs/Pleura: On lung window images, there are diffuse emphysematous changes present. No suspicious lung nodule is seen. However there is partial collapse of the right lower lobe which appears to be due to occlusion of the bronchus to the right lower lobe presumably due to right infrahilar mass. No definite pleural effusion is seen. Upper Abdomen: On delayed images, there are multiple liver lesions of varying sizes consistent with diffuse liver metastases. The majority of these larger lesions are present near the porta hepatis. Musculoskeletal: There are diffuse sclerotic bone metastases is noted. Sclerotic foci are present throughout multiple thoracic vertebral bodies consistent with diffuse metastases. No compression deformity is seen. Review of the MIP images confirms the above findings. IMPRESSION: 1. No evidence of acute pulmonary embolism. 2. Collapse of the right lower lobe probably due to infrahilar infiltrative mass and atelectasis. 3. Diffuse liver metastases. 4. Multiple  sclerotic bone lesions consistent with diffuse skeletal metastases. 5. Coronary artery calcifications. Electronically Signed   By: Ivar Drape M.D.   On: 06/18/2018 15:37   Ct Abdomen Pelvis W Contrast  Result Date: 06/18/2018 CLINICAL DATA:  Shortness of breath EXAM: CT ABDOMEN AND PELVIS WITH CONTRAST TECHNIQUE: Multidetector CT imaging of the abdomen and pelvis was performed using the standard protocol following bolus administration of intravenous contrast. CONTRAST:  140mL ISOVUE-370 IOPAMIDOL (ISOVUE-370) INJECTION 76% COMPARISON:  CT scan 04/12/2018 FINDINGS: Lower chest: Persistent right lower lobe collapse. No definite pulmonary nodules. No pleural effusions. Stable emphysematous changes. Hepatobiliary: Significant progression of hepatic metastatic disease, suggesting very aggressive disease. Right hepatic lobe lesion on image 28 of series 9 measures 6.0 x 4.5 cm and previously measured 2.3 x 2.2 cm. Segment 7 lesion on image number 15 of series 9 measures 3.7 x 3.6 cm and previously measured a maximum 19 mm. Caudate lobe lesion measures 4.3 x 4.1 cm and previously measured a maximum of 9 mm. Segment 3 lesion on image number 36 measures 5.5 x 3.1 cm and previously measured a maximum of 16 mm. Innumerable new hepatic metastatic lesions. Biliary stent is in place. The gallbladder is  grossly normal. The portal vein is patent. Pancreas: No mass, inflammation.  Mild stable ductal dilatation. Spleen: Normal size.  No focal lesions. 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 The right adrenal gland is normal. Both kidneys are unremarkable and stable. The bladder is unremarkable. Stomach/Bowel: The stomach, duodenum, small bowel and colon are grossly normal. No acute inflammatory changes, mass lesions or obstructive findings. Vascular/Lymphatic: Advanced atherosclerotic calcifications involving the aorta and branch vessel ostia. No  aneurysm or dissection. The major venous structures are patent. Small scattered mesenteric and retroperitoneal lymph nodes. Left para-aortic node on image number 33 measures 16.5 x 12 mm and was not present on the prior study. Progressive periportal lymphadenopathy. Reproductive: The uterus and ovaries are unremarkable. 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 Other: Small to moderate free pelvic fluid. No pelvic mass or adenopathy. No inguinal adenopathy. Sebaceous cyst noted in the right pubic area. Musculoskeletal: Overall stable diffuse lytic and sclerotic metastatic bone disease. Stable appearing pathologic fracture of L2 with retropulsion and mild canal compromise. No new pathologic fractures are demonstrated. IMPRESSION: 1. Markedly progressive hepatic metastatic disease. 2. Enlarging left adrenal gland metastasis. 3. Progressive periportal and retroperitoneal adenopathy. 4. Stable appearing diffuse lytic and sclerotic metastatic bone disease with a stable appearing pathologic fracture of L2. No progressive spinal canal compromise. Electronically Signed   By: Marijo Sanes M.D.   On: 06/18/2018 15:46        Scheduled Meds: . enoxaparin (LOVENOX) injection  40 mg Subcutaneous Q24H  . potassium chloride  40 mEq Oral Once   Continuous Infusions: . sodium chloride Stopped (06/19/18 1349)  . ceFEPime (MAXIPIME) IV 1 g (06/20/18 0956)  . vancomycin Stopped (06/19/18 1858)     LOS: 1 day    Time spent: 25 mins.More than 50% of that time was spent in counseling and/or coordination of care.      Shelly Coss, MD [facility] Pager [phone]  If 7PM-7AM, please contact night-coverage [email or web address] Password [email or web address] 06/20/2018, 10:10 AM

## 2018-06-20 NOTE — Consult Note (Signed)
Consultation Note Date: 06/20/2018   Patient Name: Haley [name]  DOB: Nov 28, 1950  MRN: [id number]  Age / Sex: 67 y.o., female  PCP: Alycia Rossetti, MD Referring Physician: Shelly Coss, MD  Reason for Consultation: Establishing goals of care  HPI/Patient Profile: 67 y.o. female  with past medical history of metastatic SCLC admitted on 06/18/2018 with pain and SOB and found to have continued progression of disease.  Palliative consulted for [place].   Clinical Assessment and Goals of Care: I met today with patient and her family. We discussed clinical course as well as wishes moving forward in regard to advanced directives.  Concepts specific to code status and rehospitalization discussed.  We discussed difference between a aggressive medical intervention path and a palliative, comfort focused care path.  Values and goals of care important to patient and family were attempted to be elicited.  Concept of Hospice and Palliative Care were discussed  Questions and concerns addressed.   PMT will continue to support holistically.  SUMMARY OF RECOMMENDATIONS   DNR/DNI Family is clear in desire to transition home tomorrow with hospice support.  Code Status/Advance Care Planning: We discussed that in light of multiple chronic medical problems that have worsened with this acute problem, care should be focused on interventions that are likely to allow the patient to achieve goal of getting back to home and spending time with family. I discussed with family regarding heroic interventions at the end-of-life and they agree this would not be in line with prior expressed wishes for a natural death or be likely to lead to getting well enough to go back home. They were in agreement with changing CODE STATUS to DO NOT RESUSCITATE.   Symptom Management:   Pain: Schedule oxycodone 61m Q 6 hours with additional 10 mg Q  3 hours as needed.  Constipation: Senna and miralax  Palliative Prophylaxis:   Frequent Pain Assessment  Additional Recommendations (Limitations, Scope, Preferences):  Avoid Hospitalization and Full Comfort Care  Psycho-social/Spiritual:   Desire for further Chaplaincy support:no  Additional Recommendations: Education on Hospice  Prognosis:   < 6 months  Discharge Planning: Home with Hospice      Primary Diagnoses: Present on Admission: . Pancreatitis   I have reviewed the medical record, interviewed the patient and family, and examined the patient. The following aspects are pertinent.  Past Medical History:  Diagnosis Date  . Allergy   . Back pain   . Osteopenia   [place]   Extended stage with metastases to liver and bone  . Tubular adenoma of colon 10/26/2014   Social History   Socioeconomic History  . Marital status: Single    Spouse name: Not on file  . Number of children: Not on file  . Years of education: Not on file  . Highest education level: Not on file  Occupational History  . Not on file  Social Needs  . Financial resource strain: Not on file  . Food insecurity:    Worry:  Not on file    Inability: Not on file  . Transportation needs:    Medical: No    Non-medical: No  Tobacco Use  . Smoking status: Former Smoker    Packs/day: 1.00    Years: 40.00    Pack years: 40.00    Types: Cigarettes    Last attempt to quit: 09/24/2017    Years since quitting: 0.7  . Smokeless tobacco: Never Used  Substance and Sexual Activity  . Alcohol use: Never    Frequency: Never  . Drug use: No  . Sexual activity: Not Currently  Lifestyle  . Physical activity:    Days per week: Not on file    Minutes per session: Not on file  . Stress: Not on file  Relationships  . Social connections:    Talks on phone: Not on file    Gets together: Not on file    Attends religious service: Not on file    Active member of club or  organization: Not on file    Attends meetings of clubs or organizations: Not on file    Relationship status: Not on file  Other Topics Concern  . Not on file  Social History Narrative  . Not on file   Family History  Problem Relation Age of Onset  . Diabetes Mother   . Hypertension Mother   . Diabetes Sister   . Hypertension Sister   . Hyperlipidemia Sister   . Adrenal disorder Neg Hx    Scheduled Meds: . oxyCODONE  10 mg Oral Q6H  . polyethylene glycol  17 g Oral Daily  . senna  2 tablet Oral BID   Continuous Infusions: . sodium chloride Stopped (06/19/18 1349)  . sodium chloride 75 mL/hr (06/20/18 1127)   PRN Meds:.sodium chloride, HYDROmorphone (DILAUDID) injection, MUSCLE RUB, oxyCODONE Medications Prior to Admission:  Prior to Admission medications   Medication Sig Start Date End Date Taking? Authorizing Provider  amitriptyline (ELAVIL) 25 MG tablet Take one tablet (25 mg) at bedtime for 2 weeks, then increase to 2 tablets (50 mg) at bedtime Patient taking differently: Take 25-50 mg by mouth See admin instructions. Take 25 mg at bedtime for 2 weeks, then increase to 50 mg at bedtime 06/08/18  Yes Arfeen, Arlyce Harman, MD  calcium carbonate (OS-CAL - DOSED IN MG OF ELEMENTAL CALCIUM) 1250 (500 Ca) MG tablet Take 1,250 mg by mouth daily.    Yes [provider]  cholecalciferol (VITAMIN D) 1000 units tablet Take 1,000 Units by mouth daily.   Yes [provider]  famotidine (PEPCID AC) 10 MG chewable tablet Chew 10 mg by mouth daily.    Yes [provider]  hydrocortisone (CORTEF) 10 MG tablet TAKE 2 TABLETS (20 MG TOTAL) BY MOUTH DAILY WITH BREAKFAST AND 1 TABLET DAILY WITH LUNCH. Patient taking differently: Take 10-20 mg by mouth See admin instructions. Take 20 mg by mouth in the morning and 10 mg by mouth at lunch 06/05/18  Yes Kale, Cloria Spring, MD  Multiple Vitamins-Minerals (MULTIVITAMIN WITH MINERALS) tablet Take 1 tablet by mouth daily.   Yes  [provider]  ondansetron (ZOFRAN) 4 MG tablet Take 1 tablet (4 mg total) by mouth every 8 (eight) hours as needed for nausea or vomiting. 06/08/18  Yes Brunetta Genera, MD  oxyCODONE (OXY IR/ROXICODONE) 5 MG immediate release tablet Take 1-2 tablets (5-10 mg total) by mouth every 4 (four) hours as needed for moderate pain or severe pain. 06/08/18  Yes [place],  Cloria Spring, MD  polyethylene glycol [facility]) packet Take 17 g by mouth daily as needed for moderate constipation. 10/18/17  Yes Bonnielee Haff, MD  senna-docusate (SENNA S) 8.6-50 MG tablet Take 2 tablets by mouth at bedtime. Patient taking differently: Take 2 tablets by mouth at bedtime as needed for mild constipation.  09/30/17  Yes Brunetta Genera, MD  sodium chloride 1 g tablet Take 5 tablets (5 g total) by mouth 2 (two) times daily with a meal. Patient taking differently: Take 3-4 g by mouth See admin instructions. Take 3 g by mouth in the morning, 3 g by mouth at lunch and 4 g by mouth in the evening 05/11/18  Yes Renato Shin, MD  dronabinol (MARINOL) 2.5 MG capsule Take 1 capsule (2.5 mg total) by mouth 2 (two) times daily before lunch and supper. Patient not taking: Reported on 06/18/2018 05/05/18   Patrecia Pour, Christean Grief, MD  DULoxetine (CYMBALTA) 20 MG capsule Take one capsule daily for 2 week and than two capsule daily Patient not taking: Reported on 06/18/2018 06/02/18   Arfeen, Arlyce Harman, MD  metoCLOPramide (REGLAN) 10 MG tablet Take 1 tablet (10 mg total) by mouth every 8 (eight) hours as needed for nausea. Patient not taking: Reported on 05/21/2018 03/16/18   Harle Stanford., PA-C  prochlorperazine (COMPAZINE) 10 MG tablet Take 1 tablet (10 mg total) by mouth every 6 (six) hours as needed (Nausea or vomiting). 10/22/17 05/22/18  Brunetta Genera, MD   No Known Allergies Review of Systems  Constitutional: Positive for activity change and fatigue.  Gastrointestinal: Positive for constipation.    Psychiatric/Behavioral: Positive for sleep disturbance.   Physical Exam General: Alert, awake, in no acute distress. Chronically ill appearing HEENT: No bruits, no goiter, no JVD Heart: Regular rate and rhythm. No murmur appreciated. Lungs: Good air movement, clear Abdomen: Soft, nontender, nondistended, positive bowel sounds.  Ext: Trace edema Skin: Warm and dry Neuro: Grossly intact, nonfocal.   Vital Signs: BP 123/83 (BP Location: Right Arm)   Pulse 73   Temp 98 F (36.7 C) (Oral)   Resp 15   Ht 5' 3"  (1.6 m)   Wt 52.4 kg   SpO2 91%   BMI 20.46 kg/m  Pain Scale: 0-10 POSS *See Group Information*: S-Acceptable,Sleep, easy to arouse Pain Score: 0-No pain   SpO2: SpO2: 91 % O2 Device:SpO2: 91 % O2 Flow Rate: .   IO: Intake/output summary:   Intake/Output Summary (Last 24 hours) at 06/20/2018 2318 Last data filed at 06/20/2018 1251 Gross per 24 hour  Intake 1134.67 ml  Output 600 ml  Net 534.67 ml    LBM: Last BM Date: 06/17/18 Baseline Weight: Weight: 50.3 kg Most recent weight: Weight: 52.4 kg     Palliative Assessment/Data:     Time In: 1430 Time Out: 1550 Time Total: 80 Greater than 50%  of this time was spent counseling and coordinating care related to the above assessment and plan.  Signed by: Micheline Rough, MD   Please contact Palliative Medicine Team phone at [phone] for questions and concerns.  For individual provider: See Shea Evans

## 2018-06-20 NOTE — Progress Notes (Signed)
PT Cancellation Note  Patient Details Name: Haley [name] MRN: [id number] DOB: 28-Mar-1951   Cancelled Treatment:    Reason Eval/Treat Not Completed: Fatigue/lethargy limiting ability to participate , patient  Declined, very sleepy. Staff reports using Bedpan. Will check another time.   Claretha Cooper 06/20/2018, 12:23 PM  Tresa Endo PT Acute [facility] Services Pager [phone] Office [phone]

## 2018-06-21 DIAGNOSIS — E876 Hypokalemia: Secondary | ICD-10-CM

## 2018-06-21 MED ORDER — POLYETHYLENE GLYCOL 3350 17 G PO PACK: 17.0000 g | PACK | Freq: Every day | ORAL | 0 refills | Status: AC

## 2018-06-21 MED ORDER — LORAZEPAM 2 MG/ML PO CONC: 0.6000 mg | ORAL | 0 refills | Status: AC | PRN

## 2018-06-21 MED ORDER — LORAZEPAM 2 MG/ML PO CONC: 0.5000 mg | ORAL | Status: DC | PRN

## 2018-06-21 MED ORDER — SENNA 8.6 MG PO TABS: 2.0000 | ORAL_TABLET | Freq: Two times a day (BID) | ORAL | 0 refills | Status: AC

## 2018-06-21 MED ORDER — HALOPERIDOL LACTATE 2 MG/ML PO CONC
1.0000 mg | Freq: Four times a day (QID) | ORAL | Status: DC | PRN
  Filled 2018-06-21: qty 0.5

## 2018-06-21 MED ORDER — OXYCODONE HCL 10 MG PO TABS: 10.0000 mg | ORAL_TABLET | ORAL | 0 refills | Status: AC | PRN

## 2018-06-21 MED ORDER — MUSCLE RUB 10-15 % EX CREA: 1.0000 "application " | TOPICAL_CREAM | CUTANEOUS | 0 refills | Status: AC | PRN

## 2018-06-21 MED ORDER — ATROPINE SULFATE 1 % OP SOLN
3.0000 [drp] | Freq: Four times a day (QID) | OPHTHALMIC | Status: DC | PRN
  Filled 2018-06-21: qty 2

## 2018-06-21 MED ORDER — HALOPERIDOL LACTATE 2 MG/ML PO CONC: 1.0000 mg | Freq: Four times a day (QID) | ORAL | 0 refills | Status: AC | PRN

## 2018-06-21 MED ORDER — ALUM & MAG HYDROXIDE-SIMETH 200-200-20 MG/5ML PO SUSP
30.0000 mL | Freq: Once | ORAL | Status: AC
  Administered 2018-06-21: 30 mL via ORAL
  Filled 2018-06-21: qty 30

## 2018-06-21 MED ORDER — ATROPINE SULFATE 1 % OP SOLN: 3.0000 [drp] | Freq: Four times a day (QID) | OPHTHALMIC | 0 refills | Status: AC | PRN

## 2018-06-21 NOTE — Discharge Summary (Signed)
Discharge Summary  Haley [name]:[id number] DOB: February 22, 1951  PCP: Alycia Rossetti, MD  Admit date: 06/18/2018 Discharge date: 06/21/2018  Time spent: 35 minutes  Recommendations for Outpatient Follow-up:  1. Follow-up with hospice care  Discharge Diagnoses:  Active Hospital Problems   Diagnosis Date Noted  . Metastatic lung cancer (metastasis from lung to other site) ([place]) 06/19/2018  . Collapse of right lung   . Pancreatitis, recurrent 06/18/2018  . Hypokalemia 06/18/2018  . Pancreatitis 06/18/2018  . Liver function abnormality     Resolved Hospital Problems  No resolved problems to display.    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  Imagings done on presentation showed progressive metastatic disease and also possible pneumonia on right lung base. Admitted for further management and evaluation.  Oncology and palliative care consulted.  06/21/2018: Patient seen and examined at bedside.  No acute events overnight.  Patient is now for comfort care.  She would like to go home with hospice.  Denies any pain this morning with current pain management.  Hospital Course:  Principal Problem:   Metastatic lung cancer (metastasis from lung to other site) [facility]) Active Problems:   Liver function abnormality   Pancreatitis, recurrent   Hypokalemia   Pancreatitis   Collapse of right lung  Assessment: Metastatic small cell lung cancer Abdominal pain from progressive metastatic disease to the abdomen Resolved lactic acidosis Chronic back  pain Metastatic bone disease/pathological fracture at L2 Hypokalemia Elevated lipase Generalized weakness/physical debility  Plan: Plan for home with hospice For comfort care   Procedures:  None  Consultations:  Palliative care team  Oncology  Discharge Exam: BP 135/76 (BP Location: Right Leg)   Pulse 79   Temp 97.9 F (36.6 C) (Oral)   Resp 17   Ht 5\' 3"  (1.6 m)   Wt 52.4 kg   SpO2 92%   BMI 20.46 kg/m  . General: 67 y.o. year-old female well developed well nourished in no acute distress.  Alert and interactive. . Cardiovascular: Regular rate and rhythm with no rubs or gallops.  No thyromegaly or JVD noted.   Marland Kitchen Respiratory: Clear to auscultation with no wheezes or rales. Good inspiratory effort. . Abdomen: Soft nontender nondistended with normal bowel sounds x4 quadrants. . Musculoskeletal: No lower extremity edema. 2/4 pulses in all 4 extremities. . Skin: No ulcerative lesions noted or rashes, . Psychiatry: Mood is appropriate for condition and setting  Discharge Instructions You were cared for by a hospitalist during your hospital stay. If you have any questions about your discharge medications or the care you received while you were in the hospital after you are discharged, you can call the unit and asked to speak with the hospitalist on call if the hospitalist that took care of you is not available. Once you are discharged, your primary care physician will handle any further medical issues. Please note that NO REFILLS for any discharge medications will be authorized once you are discharged, as it is imperative that you return to your primary care physician (or establish a  relationship with a primary care physician if you do not have one) for your aftercare needs so that they can reassess your need for medications and monitor your lab values.   Allergies as of 06/21/2018   No Known Allergies     Medication List    STOP taking these medications   amitriptyline 25  MG tablet Commonly known as:  ELAVIL   calcium carbonate 1250 (500 Ca) MG tablet Commonly known as:  OS-CAL - dosed in mg of elemental calcium   cholecalciferol 1000 units tablet Commonly known as:  VITAMIN D   dronabinol 2.5 MG capsule Commonly known as:  MARINOL   DULoxetine 20 MG capsule Commonly known as:  CYMBALTA   famotidine 10 MG chewable tablet Commonly known as:  PEPCID AC   hydrocortisone 10 MG tablet Commonly known as:  CORTEF   metoCLOPramide 10 MG tablet Commonly known as:  REGLAN   multivitamin with minerals tablet   ondansetron 4 MG tablet Commonly known as:  ZOFRAN   senna-docusate 8.6-50 MG tablet Commonly known as:  Senokot-S   sodium chloride 1 g tablet     TAKE these medications   atropine 1 % ophthalmic solution Place 3 drops under the tongue every 6 (six) hours as needed (Excess secretions).   haloperidol 2 MG/ML solution Commonly known as:  HALDOL Take 0.5 mLs (1 mg total) by mouth every 6 (six) hours as needed for agitation.   LORazepam 2 MG/ML concentrated solution Commonly known as:  ATIVAN Take 0.3 mLs (0.6 mg total) by mouth every 4 (four) hours as needed for anxiety.   MUSCLE RUB 10-15 % Crea Apply 1 application topically as needed for muscle pain.   Oxycodone HCl 10 MG Tabs Take 1 tablet (10 mg total) by mouth every 3 (three) hours as needed for moderate pain, severe pain or breakthrough pain. What changed:    medication strength  how much to take  when to take this  reasons to take this   polyethylene glycol packet Commonly known as:  MIRALAX / GLYCOLAX Take 17 g by mouth daily. Start taking on:  06/22/2018 What changed:    when to take this  reasons to take this   senna 8.6 MG Tabs tablet Commonly known as:  SENOKOT Take 2 tablets (17.2 mg total) by mouth 2 (two) times daily.      No Known Allergies Follow-up Information    [facility] Call in 1 day(s).   Specialty:  Hospice and  Palliative Medicine Why:  Please call for follow-up appointment Contact information: [place] [address] [phone]            The results of significant diagnostics from this hospitalization (including imaging, microbiology, ancillary and laboratory) are listed below for reference.    Significant Diagnostic Studies: Dg Chest 2 View  Result Date: 06/18/2018 CLINICAL DATA:  Short of breath, nausea, abdominal pain, history of lung carcinoma EXAM: CHEST - 2 VIEW COMPARISON:  CT abdomen pelvis of 05/02/2018 and chest x-ray of 05/13/2017 FINDINGS: There is opacity at the right lung base which may represent atelectasis and/or pneumonia. The left lung is clear. Mediastinal and hilar contours are unremarkable. The heart is mildly enlarged. No bony abnormality is seen. IMPRESSION: Abnormal opacity at the right lung base may be due to atelectasis and possibly pneumonia in this patient with known right infrahilar lesion. Electronically Signed   By: Ivar Drape M.D.   On: 06/18/2018 14:07   Ct Angio  Chest Pe W And/or Wo Contrast  Result Date: 06/18/2018 CLINICAL DATA:  Short of breath, abdominal pain, history of lung carcinoma EXAM: CT ANGIOGRAPHY CHEST WITH CONTRAST TECHNIQUE: Multidetector CT imaging of the chest was performed using the standard protocol during bolus administration of intravenous contrast. Multiplanar CT image reconstructions and MIPs were obtained to evaluate the vascular anatomy. CONTRAST:  175mL ISOVUE-370 IOPAMIDOL (ISOVUE-370) INJECTION 76% COMPARISON:  Chest x-ray from today and CT chest of 05/02/2017 FINDINGS: Cardiovascular: There is good opacification of the pulmonary arteries. There is no evidence of pulmonary embolism. The right pulmonary artery does narrow as a courses through the right infrahilar opacity possibly a mass or adenopathy. Also the pulmonary artery branches to the right lower lobe do opacify into the atelectatic right lower lobe with no  filling defect noted. The heart is moderately enlarged. No pericardial effusion is seen. Coronary artery calcifications are present primarily in the distribution of the left anterior descending coronary artery. The midthoracic aorta measures 32 mm in diameter. Moderate thoracic aortic atherosclerosis is present. Mediastinum/Nodes: Only small mediastinal lymph nodes are present. There is abnormal soft tissue of the subcarinal region toward extending to the right infrahilar region which may represent infiltration of mass. The thyroid gland is unremarkable. No hiatal hernia is seen. Lungs/Pleura: On lung window images, there are diffuse emphysematous changes present. No suspicious lung nodule is seen. However there is partial collapse of the right lower lobe which appears to be due to occlusion of the bronchus to the right lower lobe presumably due to right infrahilar mass. No definite pleural effusion is seen. Upper Abdomen: On delayed images, there are multiple liver lesions of varying sizes consistent with diffuse liver metastases. The majority of these larger lesions are present near the porta hepatis. Musculoskeletal: There are diffuse sclerotic bone metastases is noted. Sclerotic foci are present throughout multiple thoracic vertebral bodies consistent with diffuse metastases. No compression deformity is seen. Review of the MIP images confirms the above findings. IMPRESSION: 1. No evidence of acute pulmonary embolism. 2. Collapse of the right lower lobe probably due to infrahilar infiltrative mass and atelectasis. 3. Diffuse liver metastases. 4. Multiple sclerotic bone lesions consistent with diffuse skeletal metastases. 5. Coronary artery calcifications. Electronically Signed   By: Ivar Drape M.D.   On: 06/18/2018 15:37   Ct Abdomen Pelvis W Contrast  Result Date: 06/18/2018 CLINICAL DATA:  Shortness of breath EXAM: CT ABDOMEN AND PELVIS WITH CONTRAST TECHNIQUE: Multidetector CT imaging of the abdomen and  pelvis was performed using the standard protocol following bolus administration of intravenous contrast. CONTRAST:  143mL ISOVUE-370 IOPAMIDOL (ISOVUE-370) INJECTION 76% COMPARISON:  CT scan 04/12/2018 FINDINGS: Lower chest: Persistent right lower lobe collapse. No definite pulmonary nodules. No pleural effusions. Stable emphysematous changes. Hepatobiliary: Significant progression of hepatic metastatic disease, suggesting very aggressive disease. Right hepatic lobe lesion on image 28 of series 9 measures 6.0 x 4.5 cm and previously measured 2.3 x 2.2 cm. Segment 7 lesion on image number 15 of series 9 measures 3.7 x 3.6 cm and previously measured a maximum 19 mm. Caudate lobe lesion measures 4.3 x 4.1 cm and previously measured a maximum of 9 mm. Segment 3 lesion on image number 36 measures 5.5 x 3.1 cm and previously measured a maximum of 16 mm. Innumerable new hepatic metastatic lesions. Biliary stent is in place. The gallbladder is grossly normal. The portal vein is patent. Pancreas: No mass, inflammation.  Mild stable ductal dilatation. Spleen: Normal size.  No focal lesions. Adrenals/Urinary Tract:  Left adrenal gland metastasis measures 2.3 x 1.9 cm and previously measured a maximum of 10 mm. The right adrenal gland is normal. Both kidneys are unremarkable and stable. The bladder is unremarkable. Stomach/Bowel: The stomach, duodenum, small bowel and colon are grossly normal. No acute inflammatory changes, mass lesions or obstructive findings. Vascular/Lymphatic: Advanced atherosclerotic calcifications involving the aorta and branch vessel ostia. No aneurysm or dissection. The major venous structures are patent. Small scattered mesenteric and retroperitoneal lymph nodes. Left para-aortic node on image number 33 measures 16.5 x 12 mm and was not present on the prior study. Progressive periportal lymphadenopathy. Reproductive: The uterus and ovaries are unremarkable. Prominent parametrial vessels suggesting  pelvic congestion syndrome. Other: Small to moderate free pelvic fluid. No pelvic mass or adenopathy. No inguinal adenopathy. Sebaceous cyst noted in the right pubic area. Musculoskeletal: Overall stable diffuse lytic and sclerotic metastatic bone disease. Stable appearing pathologic fracture of L2 with retropulsion and mild canal compromise. No new pathologic fractures are demonstrated. IMPRESSION: 1. Markedly progressive hepatic metastatic disease. 2. Enlarging left adrenal gland metastasis. 3. Progressive periportal and retroperitoneal adenopathy. 4. Stable appearing diffuse lytic and sclerotic metastatic bone disease with a stable appearing pathologic fracture of L2. No progressive spinal canal compromise. Electronically Signed   By: Marijo Sanes M.D.   On: 06/18/2018 15:46    Microbiology: Recent Results (from the past 240 hour(s))  Culture, blood (routine x 2)     Status: None (Preliminary result)   Collection Time: 06/19/18 10:33 AM  Result Value Ref Range Status   Specimen Description   Final    BLOOD RIGHT HAND Performed at [facility] [address] [place], [place] [address]    Special Requests   Final    BAA BCAV Performed at [facility] [address] [place], [place] [address]    Culture   Final    NO GROWTH < 24 HOURS Performed at [facility] [address] [place], [place] [address]    Report Status PENDING  Incomplete  Culture, blood (routine x 2)     Status: None (Preliminary result)   Collection Time: 06/19/18 10:42 AM  Result Value Ref Range Status   Specimen Description   Final    BLOOD LEFT HAND Performed at [facility] [address], [place], [place] [address]    Special Requests   Final    BAA BCAV Performed at [facility] [address] [place],  [address]    Culture   Final    NO GROWTH < 24 HOURS Performed at [facility] [address]  [place],  [address]    Report Status PENDING  Incomplete     Labs: Basic Metabolic Panel: Recent Labs  Lab 06/18/18 1414 06/18/18 1421 06/19/18 0502 06/20/18 0313  NA 143 141 135 132*  K 3.0* 2.9* 3.4* 3.4*  CL 103 101 100 101  CO2 29  --  24 22  GLUCOSE 131* 126* 97 78  BUN 27* 26* 13 10  CREATININE 0.66 0.60 0.33* 0.37*  CALCIUM 8.3*  --  7.5* 6.8*  MG  --   --  2.1  --    Liver Function Tests: Recent Labs  Lab 06/18/18 1414 06/19/18 0502 06/20/18 0313  AST 251* 245* 290*  ALT 105* 99* 104*  ALKPHOS 400* 335* 365*  BILITOT 1.8* 1.5* 2.6*  PROT 6.3* 5.3* 5.1*  ALBUMIN 2.9* 2.6* 2.6*   Recent  Labs  Lab 06/18/18 1414 06/19/18 0502 06/20/18 0313  LIPASE 80* 322* 259*   No results for input(s): AMMONIA in the last 168 hours. CBC: Recent Labs  Lab 06/18/18 1414 06/18/18 1421 06/19/18 0502 06/20/18 0313  WBC 9.6  --  8.6 8.6  NEUTROABS 7.5  --   --  6.7  HGB 13.2 13.6 11.8* 11.5*  HCT 42.8 40.0 38.0 37.1  MCV 89.4  --  88.2 88.8  PLT 174  --  135* 120*   Cardiac Enzymes: No results for input(s): CKTOTAL, CKMB, CKMBINDEX, TROPONINI in the last 168 hours. BNP: BNP (last 3 results) No results for input(s): BNP in the last 8760 hours.  ProBNP (last 3 results) No results for input(s): PROBNP in the last 8760 hours.  CBG: No results for input(s): GLUCAP in the last 168 hours.     Signed:  Kayleen Memos, MD [facility] 06/21/2018, 11:21 AM

## 2018-06-21 NOTE — Progress Notes (Signed)
D/C Summary faxed to [facility] w/confirmation-[phone].

## 2018-06-21 NOTE — Progress Notes (Signed)
Daily Progress Note   Patient Name: Haley [name]       Date: 06/21/2018 DOB: November 21, 1950  Age: 67 y.o. MRN#: [id number] Attending Physician: Kayleen Memos, DO Primary Care Physician: Alycia Rossetti, MD Admit Date: 06/18/2018  Reason for Consultation/Follow-up: Establishing goals of care, Non pain symptom management and Pain control  Subjective: I saw and examined Haley [name] today.  She reports sleeping "terribly" but pain has been better controlled with scheduled medication.  She very much wants to go home today with hospice support.  Length of Stay: 2  Current Medications: Scheduled Meds:  . oxyCODONE  10 mg Oral Q6H  . polyethylene glycol  17 g Oral Daily  . senna  2 tablet Oral BID    Continuous Infusions: . sodium chloride Stopped (06/19/18 1349)  . sodium chloride 75 mL/hr at 06/21/18 0600    PRN Meds: sodium chloride, atropine, haloperidol, HYDROmorphone (DILAUDID) injection, LORazepam, MUSCLE RUB, oxyCODONE  Physical Exam         General: Alert, awake, in no acute distress. Chronically ill appearing HEENT: No bruits, no goiter, no JVD Heart: Regular rate and rhythm. No murmur appreciated. Lungs: Good air movement, clear Abdomen: Soft, nontender, nondistended, positive bowel sounds.  Ext: Trace edema Skin: Warm and dry Neuro: Grossly intact, nonfocal.  Vital Signs: BP 135/76 (BP Location: Right Leg)   Pulse 79   Temp 97.9 F (36.6 C) (Oral)   Resp 17   Ht 5\' 3"  (1.6 m)   Wt 52.4 kg   SpO2 92%   BMI 20.46 kg/m  SpO2: SpO2: 92 % O2 Device: O2 Device: Room Air O2 Flow Rate:    Intake/output summary:   Intake/Output Summary (Last 24 hours) at 06/21/2018 0926 Last data filed at 06/21/2018 0600 Gross per 24 hour  Intake 1390.26 ml  Output 200 ml    Net 1190.26 ml   LBM: Last BM Date: 06/17/18 Baseline Weight: Weight: 50.3 kg Most recent weight: Weight: 52.4 kg       Palliative Assessment/Data:      Patient Active Problem List   Diagnosis Date Noted  . Collapse of right lung   . Metastatic lung cancer (metastasis from lung to other site) ([place]) 06/19/2018  . Pancreatitis, recurrent 06/18/2018  . Hypokalemia 06/18/2018  . Pancreatitis 06/18/2018  . Lower abdominal  pain   . Adult failure to thrive   . Counseling regarding advance care planning and goals of care   . Bacteremia due to Pseudomonas 04/16/2018  . Abnormal head CT 04/13/2018  . Adrenal nodule ([place]) 04/13/2018  . Hyponatremia 04/13/2018  . Hyperammonemia ([place]) 04/13/2018  . Lactic acidosis 04/13/2018  . Thrombocytosis ([place]) 04/13/2018  . Sepsis ([place]) 04/13/2018  . Hypotension 04/13/2018  . Protein-calorie malnutrition, severe 04/13/2018  . Liver function abnormality   . Generalized abdominal pain   . Malnutrition of moderate degree 03/02/2018  . Dehydration 03/01/2018  . Nausea & vomiting 03/01/2018  . Brain metastasis ([place]) 01/08/2018  . HCAP (healthcare-associated pneumonia) 12/19/2017  . Extensive stage primary small cell carcinoma of lung ([place]) 10/22/2017  . Bone metastasis ([place]) 10/22/2017  . Counseling regarding advanced care planning and goals of care 10/22/2017  . Liver metastases ([place])   . 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 Metastatic disease ([place])   . Jaundice 10/13/2017  . Mood disorder ([place]) 05/12/2017  . Osteoarthritis 05/12/2017  . Osteopenia 05/06/2016  . Eustachian tube dysfunction 07/03/2015  . Allergic rhinitis 07/03/2015  . Tobacco use disorder 07/03/2015  . Hyperlipidemia 07/03/2015    Palliative Care Assessment & Plan   Patient Profile: 68 y.o. female  with past medical history of metastatic SCLC admitted on 06/18/2018 with pain and SOB and found to have continued progression of disease.  Palliative consulted for [facility].    Recommendations/Plan:  Plan for home with hospice.  Discussed with CM this AM and appreciate CM assistance to facilitate.  On discharge, would recommend scripts for: - Oxycodone 10mg  every 3 hours as needed for pain or shortness of breath - Lorazepam 2mg /ml concentrated solution: 1mg  (0.73ml) sublingual every 4 hours as needed for anxiety - Haldol 2mg /ml solution: 1mg  (0.7ml) sublingual every 4 hours as needed for agitation or nausea  - Atropine 1% ophthalmic solution: 3 drops sublingual every 6 hours as needed for excess secretions - Senna 2 tabs twice daily - Miralax 1 capful daily as needed   Goals of Care and Additional Recommendations:  Limitations on Scope of Treatment: Full Comfort Care  Code Status:    Code Status Orders  (From admission, onward)         Start     Ordered   06/20/18 1738  Do not attempt resuscitation (DNR)  Continuous    Question Answer Comment  In the event of cardiac or respiratory ARREST Do not call a "code blue"   In the event of cardiac or respiratory ARREST Do not perform Intubation, CPR, defibrillation or ACLS   In the event of cardiac or respiratory ARREST Use medication by any route, position, wound care, and other measures to relive pain and suffering. May use oxygen, suction and manual treatment of airway obstruction as needed for comfort.      06/20/18 1739        Code Status History    Date Active Date Inactive Code Status Order ID Comments User Context   06/18/2018 1706 06/20/2018 1739 Full Code [id number]  Georgette Shell, MD ED   05/01/2018 1436 05/05/2018 1801 Full Code [id number]  Roxan Hockey, MD Inpatient   04/13/2018 0320 04/18/2018 2042 Full Code [id number]  Renee Pain, MD ED   03/01/2018 1504 03/03/2018 2022 Full Code [id number]  Aline August, MD ED   12/19/2017 1836 12/22/2017 1424 Full Code [id number]  Velvet Bathe, MD Inpatient   10/13/2017 1742 10/18/2017 1712 Full Code [id number]  Theodis Blaze,  MD Inpatient        Prognosis:   < 6 months  Discharge Planning:  Home with Hospice  Care plan was discussed with patient, RN  Thank you for allowing the Palliative Medicine Team to assist in the care of this patient.   Total Time 20 Prolonged Time Billed No      Greater than 50%  of this time was spent counseling and coordinating care related to the above assessment and plan.  Micheline Rough, MD  Please contact Palliative Medicine Team phone at [phone] for questions and concerns.

## 2018-06-21 NOTE — Care Management Note (Signed)
Case Management Note  Patient Details  Name: Haley [name] MRN: [id number] Date of Birth: [birth year]  Subjective/Objective:  Received call back from [facility]-they are able to open services-they will schedule home visit @ convenience of son Haley [name] have already contacted Haley [name]. DNR form on shadow chart.Family will transport home. No further CM needs.                  Action/Plan:dc home w/hospice   Expected Discharge Date:  06/21/18               Expected Discharge Plan:  Home w Hospice Care  In-House Referral:     Discharge planning Services  CM Consult  Post Acute Care Choice:    Choice offered to:  Adult Children  DME Arranged:    DME Agency:     HH Arranged:  RN [place] Agency:  [facility]  Status of Service:  Completed, signed off  If discussed at [place] of Stay Meetings, dates discussed:    Additional Comments:  Dessa Phi, RN 06/21/2018, 11:33 AM

## 2018-06-21 NOTE — Care Management Note (Signed)
Case Management Note  Patient Details  Name: Haley [name] MRN: [id number] Date of Birth: [birth year]  Subjective/Objective:   CM referral for home hospice choice-per Palliative Care Doctor-d/c plan home w/hospice-Son [facility]-Left VM w/son [facility] [phone]-left call back tel#. Spoke to patient in rm-chose [facility] office [place] call back from rep. DNR form signed on shadow chart.                  Action/Plan:d/c home w/hospice   Expected Discharge Date:  (unknown)               Expected Discharge Plan:  Home w Hospice Care  In-House Referral:     Discharge planning Services  CM Consult  Post Acute Care Choice:    Choice offered to:     DME Arranged:    DME Agency:     HH Arranged:    HH Agency:     Status of Service:     If discussed at [employer] [facility] [employer], dates discussed:    Additional Comments:  Dessa Phi, RN 06/21/2018, 9:27 AM

## 2018-06-21 NOTE — Progress Notes (Signed)
Spoke to [employer] @ [facility]-will review referral, will let me know if able to accept; I informed her that I will talk to Son also before she call the son. Faxed referral info w/confirmation to [phone].

## 2018-06-21 NOTE — Progress Notes (Signed)
Pt and family verbalized understanding of DC instructions. PT was assisted to a wheel chair  with her family for support and taking her home.

## 2018-06-22 ENCOUNTER — Telehealth: Payer: Self-pay | Admitting: *Deleted

## 2018-06-22 NOTE — Telephone Encounter (Signed)
Haley [name] w/[facility] left VM. Patient d/c'd from Hospital on Sunday 10/27. Hospice services were recommended. Dr. Grier Mitts verbal order is needed, then Haley [name] will fax request to office for Order and Certification of Terminal Illness.  Haley [name]: [phone]

## 2018-06-23 ENCOUNTER — Telehealth: Payer: Self-pay | Admitting: *Deleted

## 2018-06-23 NOTE — Telephone Encounter (Signed)
S/W Neoma Laming at [facility].  Ok per Dr. Irene Limbo to admit pt to hospice services.  Verbal order given, ok for Dr. Irene Limbo to act as attending.

## 2018-06-24 LAB — CULTURE, BLOOD (ROUTINE X 2)
CULTURE: NO GROWTH
Culture: NO GROWTH

## 2018-06-25 ENCOUNTER — Other Ambulatory Visit: Payer: Self-pay | Admitting: Radiation Therapy

## 2018-06-26 ENCOUNTER — Inpatient Hospital Stay: Admission: RE | Admit: 2018-06-26 | Payer: PRIVATE HEALTH INSURANCE | Source: Ambulatory Visit

## 2018-06-30 ENCOUNTER — Telehealth: Payer: Self-pay

## 2018-06-30 NOTE — Telephone Encounter (Addendum)
Received fax for MD to sign Certification of Terminal Illness. Dr. Irene Limbo out of the office for two weeks. Spoke with Pamala Hurry at [facility]. Verbalized that according to records, pt died at [facility] on 2018/07/11 at home.  Certification of Terminal Illness signed by Zola Button, MD in place of Dr. Irene Limbo during his absence. Faxed to [phone] attn: [place] [facility]. Confirmed fax receipt 07/14/18 at 1044.  Death Certificate received via fax from April Breed at [facility] and [place]. Request for completion to proceed with cremation and then original to be sent via mail to be completed and returned via mail. Sandi Mealy, PA completed. Faxed to [phone]. Confirmed fax receipt 07/14/18 at 1124.   Called April at [phone] to confirm receipt. Original to be expected in the mail. Sandi Mealy, PA notified to not mark in the medical examiner's box upon completion of official document.

## 2018-07-01 ENCOUNTER — Ambulatory Visit (HOSPITAL_COMMUNITY): Payer: Medicare Other | Admitting: Licensed Clinical Social Worker

## 2018-07-03 ENCOUNTER — Ambulatory Visit: Payer: Self-pay | Admitting: Hematology

## 2018-07-03 ENCOUNTER — Other Ambulatory Visit: Payer: Self-pay

## 2018-07-03 ENCOUNTER — Ambulatory Visit: Payer: Self-pay

## 2018-07-09 ENCOUNTER — Ambulatory Visit (HOSPITAL_COMMUNITY): Payer: Self-pay | Admitting: Psychiatry

## 2018-07-26 DEATH — deceased

## 2018-08-04 ENCOUNTER — Encounter: Payer: 59 | Admitting: Family Medicine

## 2018-09-01 ENCOUNTER — Ambulatory Visit: Payer: Self-pay | Admitting: Endocrinology

## 2018-09-02 ENCOUNTER — Ambulatory Visit: Payer: Self-pay | Admitting: Endocrinology

## 2019-07-05 IMAGING — CT NM PET TUM IMG RESTAG (PS) SKULL BASE T - THIGH
8 series · 25 of 25 positions shown · non-contrast
Comparison: PET-CT 10/09/2017. Abdominal MRI 10/15/2017. Chest CT
12/19/2017.

CLINICAL DATA: Subsequent treatment strategy for extensive stage
small cell lung cancer.

EXAM:
NUCLEAR MEDICINE PET SKULL BASE TO THIGH
TECHNIQUE: 5.8 mCi F-18 FDG was injected intravenously. Full-ring PET imaging
was performed from the skull base to thigh after the radiotracer. CT
data was obtained and used for attenuation correction and anatomic
localization.
Fasting blood glucose: 110 mg/dl

[Series 3: pet sk_thigh ac · axial · 5.0mm · 4.07mm/px · z∈[-962,-98]mm · 4 of 217 slices shown]
[im 1/217]
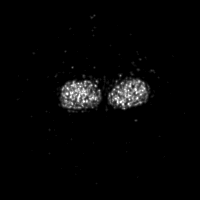
[im 73/217]
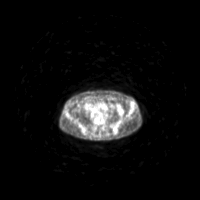
[im 145/217]
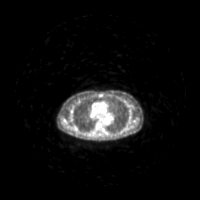
[im 217/217]
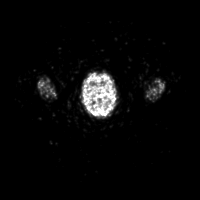

[Series 4: ct sk_thigh 5.0 b31f · axial · 5.0mm · 0.98mm/px · z∈[-962,-98]mm · 5 of 217 slices shown]
[im 1/217]
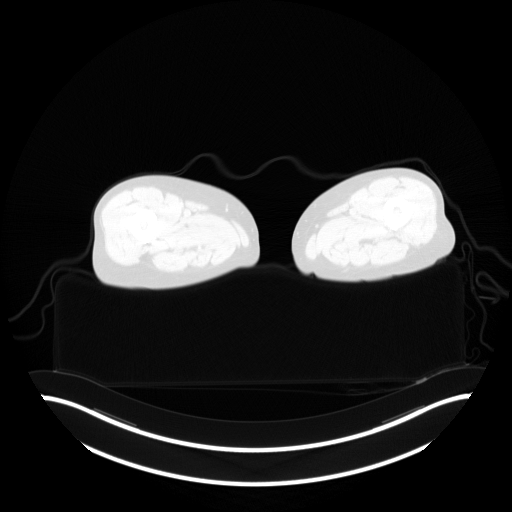
[im 55/217]
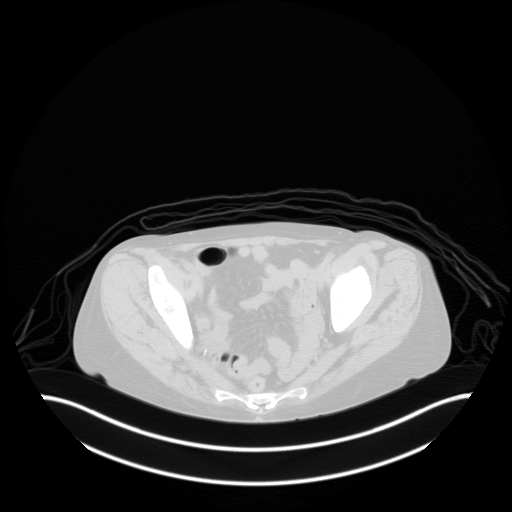
[im 109/217]
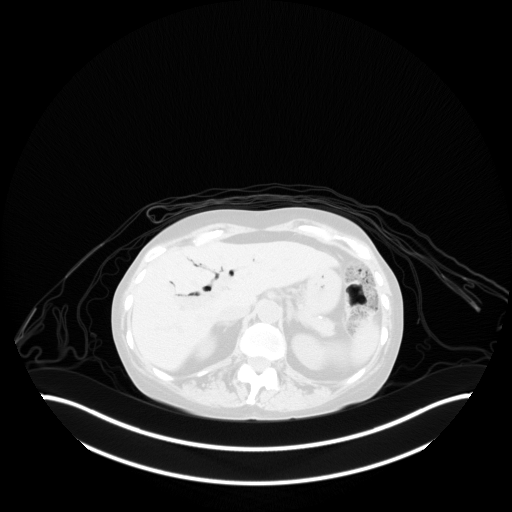
[im 163/217]
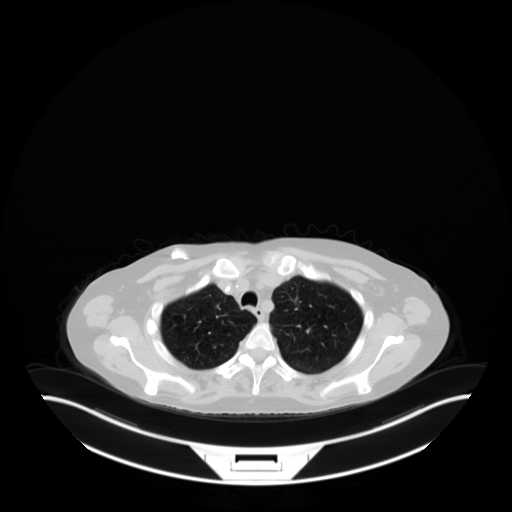
[im 217/217  brain]
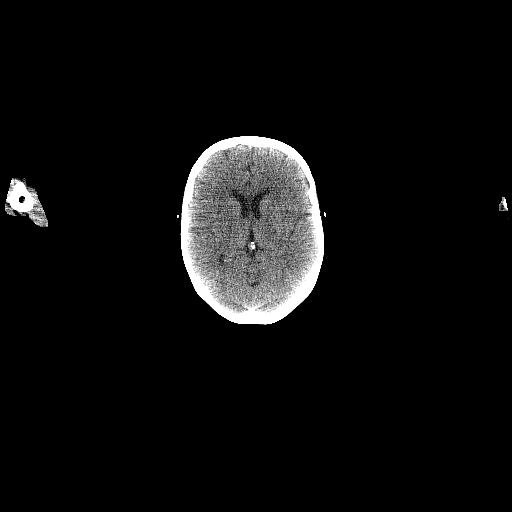

[Series 5: pet sk_thigh nac · axial · 5.0mm · 4.07mm/px · z∈[-962,-98]mm · 5 of 217 slices shown]
[im 1/217]
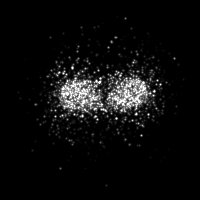
[im 55/217]
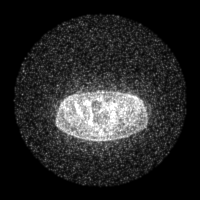
[im 109/217]
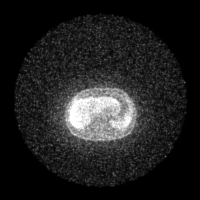
[im 163/217]
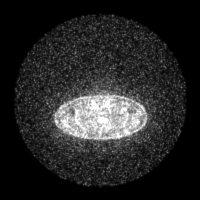
[im 217/217]
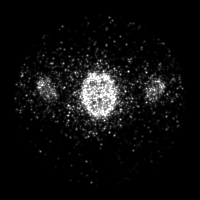

[Series 8: ct sk_thigh 5.0 b70f (id)_bone · axial · 5.0mm · 0.61mm/px · z∈[-532,-244]mm · 2 of 73 slices shown]
[im 1/73  bone]
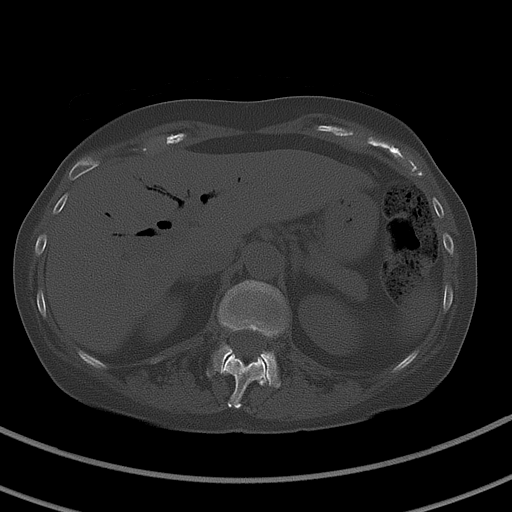
[im 73/73  bone]
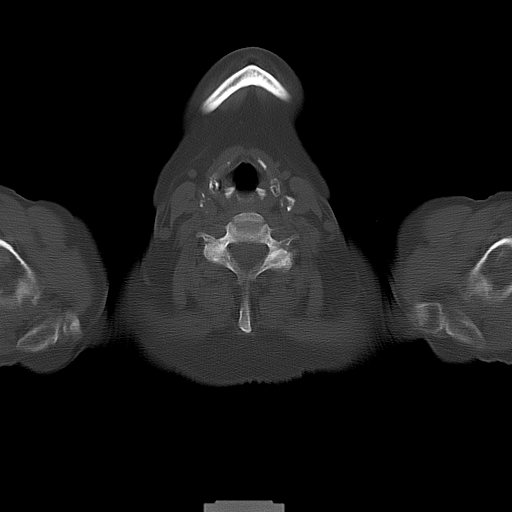

[Series 603: range-ct sk_thigh 5.0 (id)<alpha range> · 2 of 82 slices shown (1 of 2)]
[im 1/82]
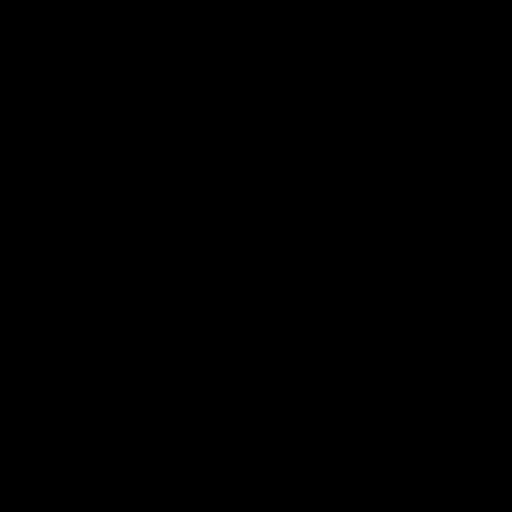
[im 82/82]
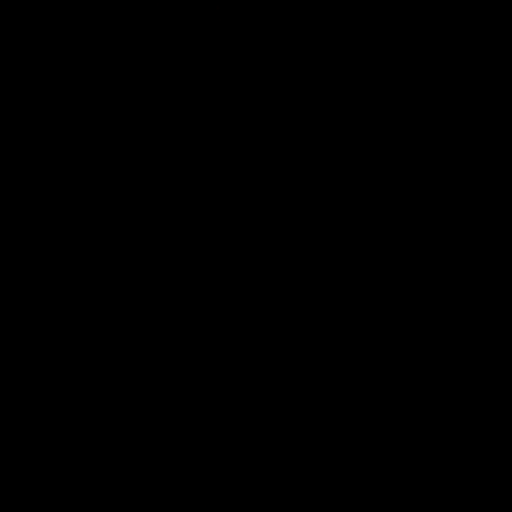

[Series 604: mip range 3 · coronal · 1.79mm/px · 1 of 32 slices shown]
[im 1/32]
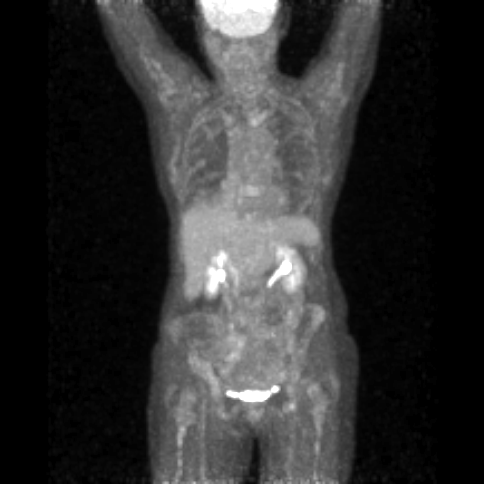

[Series 605: range-ct sk_thigh 5.0 (id)<alpha range> · 5 of 210 slices shown (2 of 2)]
[im 1/210]
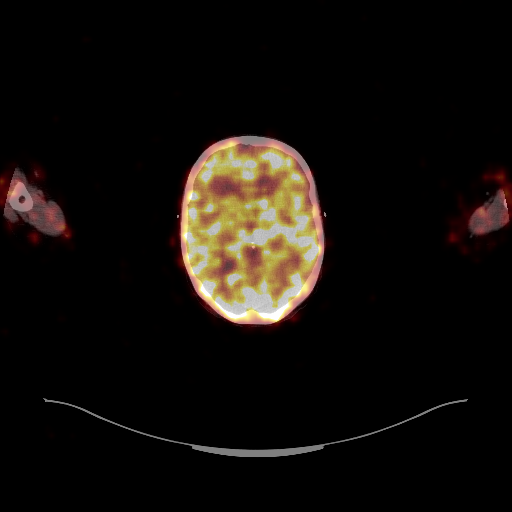
[im 53/210]
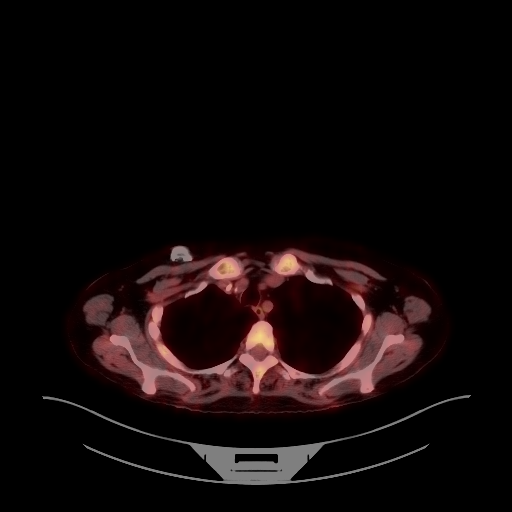
[im 105/210]
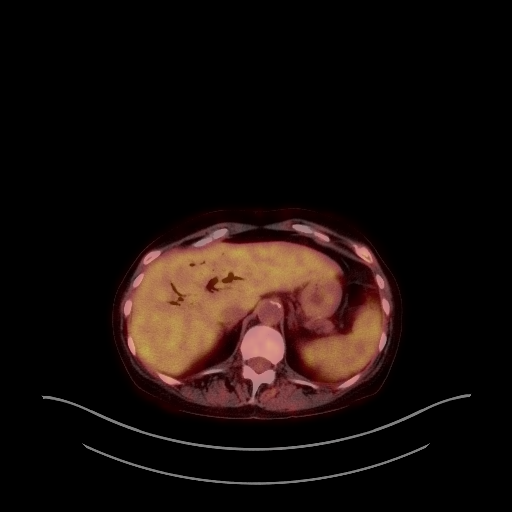
[im 157/210]
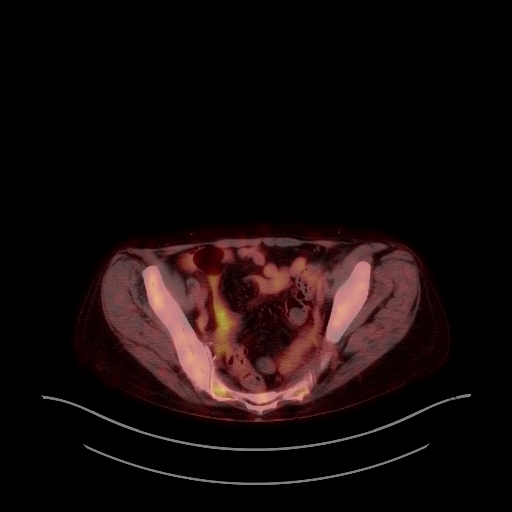
[im 210/210]
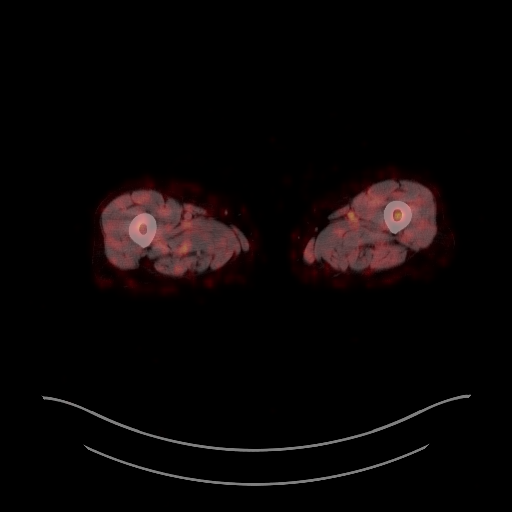

[Series 1179: results mm oncology reading · 5.0mm · 0.82mm/px · 1 of 4 slices shown]
[im 1/4]
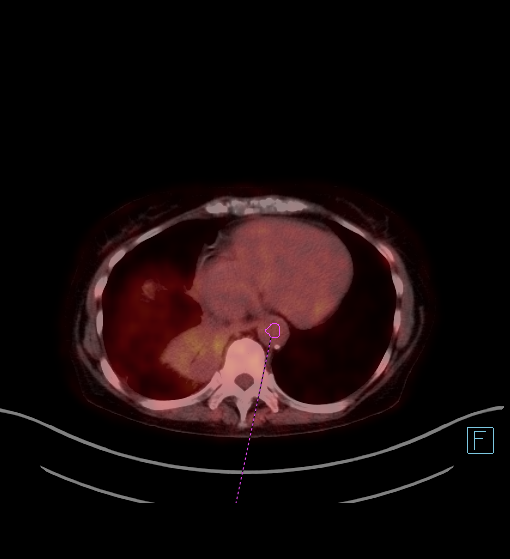

[25 of 25 positions shown; findings below may reference images not displayed]

FINDINGS: Mediastinal blood pool activity: SUV max

NECK:

No hypermetabolic cervical lymph nodes are identified.There are no
lesions of the pharyngeal mucosal space.

Incidental CT findings: Bilateral carotid atherosclerosis.

CHEST:

There are no residual enlarged or hypermetabolic mediastinal or
hilar lymph nodes. The previously demonstrated right infrahilar
hypermetabolic activity has nearly completely resolved, now with an
SUV max of 2.7 (previously 9.9). There is persistent occlusion of
the bronchus intermedius with right infrahilar pulmonary opacity.
There is no new pulmonary hypermetabolic activity. There are no new
or enlarging pulmonary nodules.

Incidental CT findings: Right IJ Port-A-Cath extends to the superior
cavoatrial junction. There is aortic and branch vessel
atherosclerosis. Moderate emphysema noted. No significant pleural
effusion.

ABDOMEN/PELVIS:

There are no residual hypermetabolic lesions within the liver,
pancreas or adrenal glands. Biliary stent has been placed with
resulting pneumobilia. There is no hypermetabolic nodal activity.

Incidental CT findings: Aortic and branch vessel atherosclerosis.
Mild distal colonic diverticulosis. No ascites.

SKELETON:

Interval significant improvement in the previously demonstrated
widespread hypermetabolic osseous metastatic disease. The greatest
remaining hypermetabolic activity is within the upper thoracic spine
(SUV max 3.8). No definite new lesions. L2 fracture and associated
osseous retropulsion are grossly stable. Some of the osseous
metastases are more sclerotic, consistent with interval treatment.

Incidental CT findings: none
IMPRESSION: 1. Marked improvement in the previously demonstrated widespread
hypermetabolic metastatic disease. There is only mild residual
hypermetabolic activity within some of the osseous lesions. No
residual nodal, hepatic, pancreatic or adrenal disease identified.
No disease progression identified.
2. Persistent occlusion of the bronchus intermedius with right
infrahilar pulmonary opacity.
3. Pneumobilia status post biliary stenting.
4. Aortic Atherosclerosis ([id number].H) and Emphysema ([id number].H).
